# Patient Record
Sex: Female | Born: 1960
Health system: Southern US, Community
[De-identification: ages and names within clinical notes are randomized; demographics above are authoritative.]

## PROBLEM LIST (undated history)

## (undated) DIAGNOSIS — M7070 Other bursitis of hip, unspecified hip: Secondary | ICD-10-CM

## (undated) DIAGNOSIS — F319 Bipolar disorder, unspecified: Secondary | ICD-10-CM

## (undated) DIAGNOSIS — R51 Headache: Secondary | ICD-10-CM

## (undated) DIAGNOSIS — N951 Menopausal and female climacteric states: Secondary | ICD-10-CM

## (undated) DIAGNOSIS — R001 Bradycardia, unspecified: Secondary | ICD-10-CM

## (undated) DIAGNOSIS — F3289 Other specified depressive episodes: Secondary | ICD-10-CM

## (undated) DIAGNOSIS — I4891 Unspecified atrial fibrillation: Secondary | ICD-10-CM

## (undated) DIAGNOSIS — R0602 Shortness of breath: Secondary | ICD-10-CM

## (undated) DIAGNOSIS — I2699 Other pulmonary embolism without acute cor pulmonale: Secondary | ICD-10-CM

## (undated) DIAGNOSIS — I341 Nonrheumatic mitral (valve) prolapse: Secondary | ICD-10-CM

## (undated) DIAGNOSIS — K219 Gastro-esophageal reflux disease without esophagitis: Secondary | ICD-10-CM

## (undated) DIAGNOSIS — Z8719 Personal history of other diseases of the digestive system: Secondary | ICD-10-CM

## (undated) DIAGNOSIS — I10 Essential (primary) hypertension: Secondary | ICD-10-CM

## (undated) DIAGNOSIS — IMO0001 Reserved for inherently not codable concepts without codable children: Secondary | ICD-10-CM

## (undated) DIAGNOSIS — I471 Supraventricular tachycardia: Secondary | ICD-10-CM

## (undated) DIAGNOSIS — E041 Nontoxic single thyroid nodule: Secondary | ICD-10-CM

## (undated) DIAGNOSIS — J439 Emphysema, unspecified: Secondary | ICD-10-CM

## (undated) DIAGNOSIS — F419 Anxiety disorder, unspecified: Secondary | ICD-10-CM

## (undated) DIAGNOSIS — Z0389 Encounter for observation for other suspected diseases and conditions ruled out: Secondary | ICD-10-CM

## (undated) DIAGNOSIS — E785 Hyperlipidemia, unspecified: Secondary | ICD-10-CM

## (undated) DIAGNOSIS — F329 Major depressive disorder, single episode, unspecified: Secondary | ICD-10-CM

## (undated) DIAGNOSIS — R251 Tremor, unspecified: Secondary | ICD-10-CM

## (undated) DIAGNOSIS — I491 Atrial premature depolarization: Secondary | ICD-10-CM

## (undated) DIAGNOSIS — I4719 Other supraventricular tachycardia: Secondary | ICD-10-CM

## (undated) HISTORY — DX: Supraventricular tachycardia: I47.1

## (undated) HISTORY — DX: Unspecified atrial fibrillation: I48.91

## (undated) HISTORY — DX: Headache: R51

## (undated) HISTORY — DX: Atrial premature depolarization: I49.1

## (undated) HISTORY — DX: Bradycardia, unspecified: R00.1

## (undated) HISTORY — DX: Menopausal and female climacteric states: N95.1

## (undated) HISTORY — PX: APPENDECTOMY: SHX54

## (undated) HISTORY — DX: Encounter for observation for other suspected diseases and conditions ruled out: Z03.89

## (undated) HISTORY — PX: COLONOSCOPY: SHX174

## (undated) HISTORY — DX: Other specified depressive episodes: F32.89

## (undated) HISTORY — DX: Nontoxic single thyroid nodule: E04.1

## (undated) HISTORY — DX: Nonrheumatic mitral (valve) prolapse: I34.1

## (undated) HISTORY — DX: Gastro-esophageal reflux disease without esophagitis: K21.9

## (undated) HISTORY — DX: Bipolar disorder, unspecified: F31.9

## (undated) HISTORY — DX: Reserved for inherently not codable concepts without codable children: IMO0001

## (undated) HISTORY — DX: Major depressive disorder, single episode, unspecified: F32.9

## (undated) HISTORY — DX: Other bursitis of hip, unspecified hip: M70.70

## (undated) HISTORY — DX: Essential (primary) hypertension: I10

## (undated) HISTORY — DX: Anxiety disorder, unspecified: F41.9

## (undated) HISTORY — DX: Shortness of breath: R06.02

## (undated) HISTORY — DX: Personal history of other diseases of the digestive system: Z87.19

## (undated) HISTORY — DX: Other supraventricular tachycardia: I47.19

## (undated) HISTORY — DX: Hyperlipidemia, unspecified: E78.5

---

## 1988-02-11 HISTORY — PX: OTHER SURGICAL HISTORY: SHX169

## 1999-08-06 ENCOUNTER — Encounter: Payer: Self-pay | Admitting: Orthopedic Surgery

## 1999-08-06 ENCOUNTER — Ambulatory Visit (HOSPITAL_COMMUNITY): Admission: RE | Admit: 1999-08-06 | Discharge: 1999-08-06 | Payer: Self-pay | Admitting: Orthopedic Surgery

## 1999-09-11 DIAGNOSIS — M7070 Other bursitis of hip, unspecified hip: Secondary | ICD-10-CM

## 1999-09-11 HISTORY — DX: Other bursitis of hip, unspecified hip: M70.70

## 2000-09-03 ENCOUNTER — Encounter: Admission: RE | Admit: 2000-09-03 | Discharge: 2000-09-03 | Payer: Self-pay | Admitting: Obstetrics and Gynecology

## 2000-09-03 ENCOUNTER — Encounter: Payer: Self-pay | Admitting: Obstetrics and Gynecology

## 2000-10-15 ENCOUNTER — Other Ambulatory Visit: Admission: RE | Admit: 2000-10-15 | Discharge: 2000-10-15 | Payer: Self-pay | Admitting: Obstetrics and Gynecology

## 2000-12-01 ENCOUNTER — Encounter (INDEPENDENT_AMBULATORY_CARE_PROVIDER_SITE_OTHER): Payer: Self-pay | Admitting: Specialist

## 2000-12-01 ENCOUNTER — Other Ambulatory Visit: Admission: RE | Admit: 2000-12-01 | Discharge: 2000-12-01 | Payer: Self-pay | Admitting: Gastroenterology

## 2000-12-17 ENCOUNTER — Ambulatory Visit (HOSPITAL_COMMUNITY): Admission: RE | Admit: 2000-12-17 | Discharge: 2000-12-17 | Payer: Self-pay | Admitting: Family Medicine

## 2000-12-17 ENCOUNTER — Encounter: Payer: Self-pay | Admitting: Family Medicine

## 2000-12-17 DIAGNOSIS — I341 Nonrheumatic mitral (valve) prolapse: Secondary | ICD-10-CM

## 2000-12-17 HISTORY — DX: Nonrheumatic mitral (valve) prolapse: I34.1

## 2001-10-26 ENCOUNTER — Encounter: Admission: RE | Admit: 2001-10-26 | Discharge: 2001-10-26 | Payer: Self-pay | Admitting: Family Medicine

## 2001-10-26 ENCOUNTER — Encounter: Payer: Self-pay | Admitting: Family Medicine

## 2001-12-16 ENCOUNTER — Other Ambulatory Visit: Admission: RE | Admit: 2001-12-16 | Discharge: 2001-12-16 | Payer: Self-pay | Admitting: Obstetrics and Gynecology

## 2002-12-14 ENCOUNTER — Encounter: Admission: RE | Admit: 2002-12-14 | Discharge: 2002-12-14 | Payer: Self-pay | Admitting: Family Medicine

## 2002-12-28 ENCOUNTER — Other Ambulatory Visit: Admission: RE | Admit: 2002-12-28 | Discharge: 2002-12-28 | Payer: Self-pay | Admitting: Obstetrics and Gynecology

## 2003-02-08 ENCOUNTER — Emergency Department (HOSPITAL_COMMUNITY): Admission: EM | Admit: 2003-02-08 | Discharge: 2003-02-08 | Payer: Self-pay | Admitting: Emergency Medicine

## 2004-02-28 ENCOUNTER — Ambulatory Visit: Payer: Self-pay | Admitting: Family Medicine

## 2004-06-10 ENCOUNTER — Ambulatory Visit: Payer: Self-pay | Admitting: Family Medicine

## 2004-06-12 ENCOUNTER — Ambulatory Visit: Payer: Self-pay | Admitting: Family Medicine

## 2004-06-24 ENCOUNTER — Encounter: Admission: RE | Admit: 2004-06-24 | Discharge: 2004-06-24 | Payer: Self-pay | Admitting: Family Medicine

## 2004-10-29 ENCOUNTER — Ambulatory Visit: Payer: Self-pay | Admitting: Family Medicine

## 2004-11-07 ENCOUNTER — Encounter: Admission: RE | Admit: 2004-11-07 | Discharge: 2004-11-07 | Payer: Self-pay | Admitting: Obstetrics and Gynecology

## 2004-12-27 ENCOUNTER — Other Ambulatory Visit: Admission: RE | Admit: 2004-12-27 | Discharge: 2004-12-27 | Payer: Self-pay | Admitting: Obstetrics and Gynecology

## 2005-09-26 ENCOUNTER — Ambulatory Visit: Payer: Self-pay | Admitting: Family Medicine

## 2005-10-27 ENCOUNTER — Ambulatory Visit (HOSPITAL_COMMUNITY): Admission: RE | Admit: 2005-10-27 | Discharge: 2005-10-27 | Payer: Self-pay | Admitting: Gastroenterology

## 2005-10-27 LAB — HM COLONOSCOPY: HM Colonoscopy: NORMAL

## 2005-11-05 ENCOUNTER — Ambulatory Visit (HOSPITAL_COMMUNITY): Admission: RE | Admit: 2005-11-05 | Discharge: 2005-11-05 | Payer: Self-pay | Admitting: Gastroenterology

## 2005-11-26 LAB — HM MAMMOGRAPHY: HM Mammogram: NORMAL

## 2006-03-20 ENCOUNTER — Ambulatory Visit: Payer: Self-pay | Admitting: Licensed Clinical Social Worker

## 2006-03-27 ENCOUNTER — Ambulatory Visit: Payer: Self-pay | Admitting: Licensed Clinical Social Worker

## 2006-04-03 ENCOUNTER — Encounter: Admission: RE | Admit: 2006-04-03 | Discharge: 2006-04-03 | Payer: Self-pay | Admitting: Obstetrics and Gynecology

## 2006-04-22 ENCOUNTER — Other Ambulatory Visit: Admission: RE | Admit: 2006-04-22 | Discharge: 2006-04-22 | Payer: Self-pay | Admitting: Obstetrics and Gynecology

## 2006-05-15 ENCOUNTER — Ambulatory Visit: Payer: Self-pay | Admitting: Licensed Clinical Social Worker

## 2006-05-21 ENCOUNTER — Ambulatory Visit: Payer: Self-pay | Admitting: Family Medicine

## 2006-05-21 LAB — CONVERTED CEMR LAB
ALT: 14 units/L (ref 0–40)
AST: 16 units/L (ref 0–37)
Albumin: 3.3 g/dL — ABNORMAL LOW (ref 3.5–5.2)
Alkaline Phosphatase: 72 units/L (ref 39–117)
BUN: 12 mg/dL (ref 6–23)
Bilirubin, Direct: 0.1 mg/dL (ref 0.0–0.3)
CO2: 26 meq/L (ref 19–32)
Calcium: 8.8 mg/dL (ref 8.4–10.5)
Chloride: 109 meq/L (ref 96–112)
Creatinine, Ser: 0.8 mg/dL (ref 0.4–1.2)
Free T4: 0.6 ng/dL (ref 0.6–1.6)
GFR calc Af Amer: 99 mL/min
GFR calc non Af Amer: 82 mL/min
Glucose, Bld: 100 mg/dL — ABNORMAL HIGH (ref 70–99)
Potassium: 3.5 meq/L (ref 3.5–5.1)
Sodium: 142 meq/L (ref 135–145)
T3, Free: 2.9 pg/mL (ref 2.3–4.2)
TSH: 1.6 microintl units/mL (ref 0.35–5.50)
Total Bilirubin: 0.5 mg/dL (ref 0.3–1.2)
Total Protein: 6.7 g/dL (ref 6.0–8.3)

## 2006-05-29 ENCOUNTER — Ambulatory Visit: Payer: Self-pay | Admitting: Licensed Clinical Social Worker

## 2006-06-12 ENCOUNTER — Ambulatory Visit: Payer: Self-pay | Admitting: Licensed Clinical Social Worker

## 2006-07-10 ENCOUNTER — Ambulatory Visit: Payer: Self-pay | Admitting: Licensed Clinical Social Worker

## 2006-07-23 ENCOUNTER — Ambulatory Visit: Payer: Self-pay | Admitting: Family Medicine

## 2006-07-23 DIAGNOSIS — F329 Major depressive disorder, single episode, unspecified: Secondary | ICD-10-CM | POA: Insufficient documentation

## 2006-07-23 DIAGNOSIS — F3289 Other specified depressive episodes: Secondary | ICD-10-CM | POA: Insufficient documentation

## 2006-07-23 DIAGNOSIS — R638 Other symptoms and signs concerning food and fluid intake: Secondary | ICD-10-CM | POA: Insufficient documentation

## 2006-07-23 DIAGNOSIS — R5381 Other malaise: Secondary | ICD-10-CM | POA: Insufficient documentation

## 2006-07-23 DIAGNOSIS — R5383 Other fatigue: Secondary | ICD-10-CM

## 2006-07-23 LAB — CONVERTED CEMR LAB
Bilirubin Urine: NEGATIVE
Blood in Urine, dipstick: NEGATIVE
Glucose, Bld: 96 mg/dL
Glucose, Urine, Semiquant: NEGATIVE
Hemoglobin: 11.2 g/dL
Ketones, urine, test strip: NEGATIVE
Nitrite: NEGATIVE
Protein, U semiquant: NEGATIVE
Specific Gravity, Urine: <1.005
pH: 6

## 2006-07-28 LAB — CONVERTED CEMR LAB
ALT: 16 units/L (ref 0–40)
AST: 18 units/L (ref 0–37)
Albumin: 3.6 g/dL (ref 3.5–5.2)
Alkaline Phosphatase: 69 units/L (ref 39–117)
BUN: 6 mg/dL (ref 6–23)
Basophils Absolute: 0.1 10*3/uL (ref 0.0–0.1)
Basophils Relative: 1.2 % — ABNORMAL HIGH (ref 0.0–1.0)
Bilirubin, Direct: 0.1 mg/dL (ref 0.0–0.3)
CO2: 27 meq/L (ref 19–32)
Calcium: 9.2 mg/dL (ref 8.4–10.5)
Chloride: 103 meq/L (ref 96–112)
Creatinine, Ser: 0.8 mg/dL (ref 0.4–1.2)
Eosinophils Absolute: 0.3 10*3/uL (ref 0.0–0.6)
Eosinophils Relative: 4.1 % (ref 0.0–5.0)
GFR calc Af Amer: 99 mL/min
GFR calc non Af Amer: 82 mL/min
Glucose, Bld: 80 mg/dL (ref 70–99)
HCT: 40.4 % (ref 36.0–46.0)
Hemoglobin: 13.7 g/dL (ref 12.0–15.0)
Lymphocytes Relative: 33.5 % (ref 12.0–46.0)
MCHC: 33.8 g/dL (ref 30.0–36.0)
MCV: 87.1 fL (ref 78.0–100.0)
Monocytes Absolute: 0.5 10*3/uL (ref 0.2–0.7)
Monocytes Relative: 6.9 % (ref 3.0–11.0)
Neutro Abs: 3.8 10*3/uL (ref 1.4–7.7)
Neutrophils Relative %: 54.3 % (ref 43.0–77.0)
Platelets: 328 10*3/uL (ref 150–400)
Potassium: 3.5 meq/L (ref 3.5–5.1)
RBC: 4.64 M/uL (ref 3.87–5.11)
RDW: 13 % (ref 11.5–14.6)
Sodium: 139 meq/L (ref 135–145)
TSH: 3.8 microintl units/mL (ref 0.35–5.50)
Total Bilirubin: 0.5 mg/dL (ref 0.3–1.2)
Total Protein: 7.2 g/dL (ref 6.0–8.3)
WBC: 7.1 10*3/uL (ref 4.5–10.5)

## 2006-11-05 ENCOUNTER — Ambulatory Visit: Payer: Self-pay | Admitting: Family Medicine

## 2006-11-05 DIAGNOSIS — D179 Benign lipomatous neoplasm, unspecified: Secondary | ICD-10-CM | POA: Insufficient documentation

## 2006-11-05 DIAGNOSIS — L708 Other acne: Secondary | ICD-10-CM | POA: Insufficient documentation

## 2006-11-05 DIAGNOSIS — K219 Gastro-esophageal reflux disease without esophagitis: Secondary | ICD-10-CM | POA: Insufficient documentation

## 2006-11-05 DIAGNOSIS — Z8719 Personal history of other diseases of the digestive system: Secondary | ICD-10-CM | POA: Insufficient documentation

## 2006-11-05 DIAGNOSIS — Z8679 Personal history of other diseases of the circulatory system: Secondary | ICD-10-CM | POA: Insufficient documentation

## 2006-11-06 ENCOUNTER — Encounter (INDEPENDENT_AMBULATORY_CARE_PROVIDER_SITE_OTHER): Payer: Self-pay | Admitting: *Deleted

## 2006-11-06 ENCOUNTER — Encounter: Payer: Self-pay | Admitting: Family Medicine

## 2006-11-10 LAB — CONVERTED CEMR LAB: Valproic Acid Lvl: 33.4 ug/mL — ABNORMAL LOW (ref 50.0–100.0)

## 2006-11-27 ENCOUNTER — Telehealth (INDEPENDENT_AMBULATORY_CARE_PROVIDER_SITE_OTHER): Payer: Self-pay | Admitting: *Deleted

## 2007-04-27 ENCOUNTER — Other Ambulatory Visit: Admission: RE | Admit: 2007-04-27 | Discharge: 2007-04-27 | Payer: Self-pay | Admitting: Obstetrics and Gynecology

## 2007-08-16 ENCOUNTER — Telehealth (INDEPENDENT_AMBULATORY_CARE_PROVIDER_SITE_OTHER): Payer: Self-pay | Admitting: *Deleted

## 2007-08-18 ENCOUNTER — Ambulatory Visit: Payer: Self-pay | Admitting: Family Medicine

## 2007-08-18 DIAGNOSIS — B356 Tinea cruris: Secondary | ICD-10-CM | POA: Insufficient documentation

## 2007-09-13 ENCOUNTER — Encounter: Admission: RE | Admit: 2007-09-13 | Discharge: 2007-09-13 | Payer: Self-pay | Admitting: Family Medicine

## 2007-09-15 ENCOUNTER — Encounter (INDEPENDENT_AMBULATORY_CARE_PROVIDER_SITE_OTHER): Payer: Self-pay | Admitting: *Deleted

## 2007-10-25 ENCOUNTER — Ambulatory Visit: Payer: Self-pay | Admitting: Family Medicine

## 2007-10-25 DIAGNOSIS — R197 Diarrhea, unspecified: Secondary | ICD-10-CM | POA: Insufficient documentation

## 2007-10-26 ENCOUNTER — Encounter (INDEPENDENT_AMBULATORY_CARE_PROVIDER_SITE_OTHER): Payer: Self-pay | Admitting: *Deleted

## 2007-10-27 ENCOUNTER — Ambulatory Visit: Payer: Self-pay | Admitting: Family Medicine

## 2007-10-27 LAB — CONVERTED CEMR LAB
Bilirubin Urine: NEGATIVE
Blood in Urine, dipstick: NEGATIVE
Glucose, Urine, Semiquant: NEGATIVE
Ketones, urine, test strip: NEGATIVE
Nitrite: NEGATIVE
Protein, U semiquant: NEGATIVE
Specific Gravity, Urine: 1.01
Urobilinogen, UA: 0.2
pH: 6.5

## 2007-10-28 ENCOUNTER — Ambulatory Visit: Payer: Self-pay | Admitting: Family Medicine

## 2007-10-28 ENCOUNTER — Encounter (INDEPENDENT_AMBULATORY_CARE_PROVIDER_SITE_OTHER): Payer: Self-pay | Admitting: *Deleted

## 2007-10-28 LAB — CONVERTED CEMR LAB
OCCULT 1: NEGATIVE
OCCULT 2: NEGATIVE
OCCULT 3: NEGATIVE

## 2007-11-01 ENCOUNTER — Telehealth (INDEPENDENT_AMBULATORY_CARE_PROVIDER_SITE_OTHER): Payer: Self-pay | Admitting: *Deleted

## 2007-11-01 ENCOUNTER — Encounter (INDEPENDENT_AMBULATORY_CARE_PROVIDER_SITE_OTHER): Payer: Self-pay | Admitting: *Deleted

## 2007-11-01 LAB — CONVERTED CEMR LAB
ALT: 17 units/L (ref 0–35)
AST: 18 units/L (ref 0–37)
Albumin: 3.1 g/dL — ABNORMAL LOW (ref 3.5–5.2)
Alkaline Phosphatase: 46 units/L (ref 39–117)
BUN: 10 mg/dL (ref 6–23)
Basophils Absolute: 0 10*3/uL (ref 0.0–0.1)
Basophils Relative: 0.6 % (ref 0.0–3.0)
Bilirubin, Direct: 0.1 mg/dL (ref 0.0–0.3)
CO2: 26 meq/L (ref 19–32)
Calcium: 8.3 mg/dL — ABNORMAL LOW (ref 8.4–10.5)
Chloride: 105 meq/L (ref 96–112)
Cholesterol: 192 mg/dL (ref 0–200)
Creatinine, Ser: 0.9 mg/dL (ref 0.4–1.2)
Direct LDL: 133.1 mg/dL
Eosinophils Absolute: 0.1 10*3/uL (ref 0.0–0.7)
Eosinophils Relative: 2.2 % (ref 0.0–5.0)
GFR calc Af Amer: 86 mL/min
GFR calc non Af Amer: 71 mL/min
Glucose, Bld: 93 mg/dL (ref 70–99)
HCT: 36.9 % (ref 36.0–46.0)
HDL: 57.2 mg/dL (ref >39.0)
Hemoglobin: 13 g/dL (ref 12.0–15.0)
Lymphocytes Relative: 39 % (ref 12.0–46.0)
MCHC: 35.1 g/dL (ref 30.0–36.0)
MCV: 88.7 fL (ref 78.0–100.0)
Monocytes Absolute: 0.6 10*3/uL (ref 0.1–1.0)
Monocytes Relative: 8.9 % (ref 3.0–12.0)
Neutro Abs: 3.4 10*3/uL (ref 1.4–7.7)
Neutrophils Relative %: 49.3 % (ref 43.0–77.0)
Platelets: 203 10*3/uL (ref 150–400)
Potassium: 3.2 meq/L — ABNORMAL LOW (ref 3.5–5.1)
RBC: 4.16 M/uL (ref 3.87–5.11)
RDW: 13.1 % (ref 11.5–14.6)
Sodium: 138 meq/L (ref 135–145)
TSH: 6.85 microintl units/mL — ABNORMAL HIGH (ref 0.35–5.50)
Total Bilirubin: 0.6 mg/dL (ref 0.3–1.2)
Total CHOL/HDL Ratio: 3.4
Total Protein: 6.2 g/dL (ref 6.0–8.3)
Triglycerides: 304 mg/dL (ref 0–149)
VLDL: 61 mg/dL — ABNORMAL HIGH (ref 0–40)
Valproic Acid Lvl: 112.4 ug/mL — ABNORMAL HIGH (ref 50.0–100.0)
WBC: 6.8 10*3/uL (ref 4.5–10.5)

## 2007-11-05 ENCOUNTER — Encounter (INDEPENDENT_AMBULATORY_CARE_PROVIDER_SITE_OTHER): Payer: Self-pay | Admitting: *Deleted

## 2007-11-05 ENCOUNTER — Telehealth (INDEPENDENT_AMBULATORY_CARE_PROVIDER_SITE_OTHER): Payer: Self-pay | Admitting: *Deleted

## 2007-11-25 ENCOUNTER — Telehealth (INDEPENDENT_AMBULATORY_CARE_PROVIDER_SITE_OTHER): Payer: Self-pay | Admitting: *Deleted

## 2007-12-30 ENCOUNTER — Encounter (INDEPENDENT_AMBULATORY_CARE_PROVIDER_SITE_OTHER): Payer: Self-pay | Admitting: *Deleted

## 2007-12-31 ENCOUNTER — Ambulatory Visit: Payer: Self-pay | Admitting: Family Medicine

## 2008-01-02 LAB — CONVERTED CEMR LAB: Potassium: 4.1 meq/L (ref 3.5–5.3)

## 2008-01-03 ENCOUNTER — Encounter (INDEPENDENT_AMBULATORY_CARE_PROVIDER_SITE_OTHER): Payer: Self-pay | Admitting: *Deleted

## 2008-05-02 ENCOUNTER — Other Ambulatory Visit: Admission: RE | Admit: 2008-05-02 | Discharge: 2008-05-02 | Payer: Self-pay | Admitting: Obstetrics & Gynecology

## 2008-07-06 ENCOUNTER — Telehealth (INDEPENDENT_AMBULATORY_CARE_PROVIDER_SITE_OTHER): Payer: Self-pay | Admitting: *Deleted

## 2008-07-06 ENCOUNTER — Encounter (INDEPENDENT_AMBULATORY_CARE_PROVIDER_SITE_OTHER): Payer: Self-pay | Admitting: *Deleted

## 2008-07-06 ENCOUNTER — Ambulatory Visit: Payer: Self-pay | Admitting: Family Medicine

## 2008-07-06 DIAGNOSIS — R599 Enlarged lymph nodes, unspecified: Secondary | ICD-10-CM | POA: Insufficient documentation

## 2008-07-06 DIAGNOSIS — E039 Hypothyroidism, unspecified: Secondary | ICD-10-CM | POA: Insufficient documentation

## 2008-07-21 LAB — CONVERTED CEMR LAB
BUN: 10 mg/dL (ref 6–23)
Basophils Absolute: 0 10*3/uL (ref 0.0–0.1)
Basophils Relative: 0.2 % (ref 0.0–3.0)
CO2: 25 meq/L (ref 19–32)
Calcium: 8.8 mg/dL (ref 8.4–10.5)
Chloride: 112 meq/L (ref 96–112)
Creatinine, Ser: 0.9 mg/dL (ref 0.4–1.2)
Eosinophils Absolute: 0.1 10*3/uL (ref 0.0–0.7)
Eosinophils Relative: 2.5 % (ref 0.0–5.0)
GFR calc non Af Amer: 70.84 mL/min (ref >60)
Glucose, Bld: 103 mg/dL — ABNORMAL HIGH (ref 70–99)
HCT: 41.4 % (ref 36.0–46.0)
Hemoglobin: 14.2 g/dL (ref 12.0–15.0)
Lymphocytes Relative: 41.4 % (ref 12.0–46.0)
Lymphs Abs: 1.8 10*3/uL (ref 0.7–4.0)
MCHC: 34.3 g/dL (ref 30.0–36.0)
MCV: 90.1 fL (ref 78.0–100.0)
Monocytes Absolute: 0.2 10*3/uL (ref 0.1–1.0)
Monocytes Relative: 5.8 % (ref 3.0–12.0)
Neutro Abs: 2.2 10*3/uL (ref 1.4–7.7)
Neutrophils Relative %: 50.1 % (ref 43.0–77.0)
Platelets: 217 10*3/uL (ref 150.0–400.0)
Potassium: 4.2 meq/L (ref 3.5–5.1)
RBC: 4.59 M/uL (ref 3.87–5.11)
RDW: 13.6 % (ref 11.5–14.6)
Sodium: 144 meq/L (ref 135–145)
TSH: 2.45 microintl units/mL (ref 0.35–5.50)
WBC: 4.3 10*3/uL — ABNORMAL LOW (ref 4.5–10.5)

## 2008-07-24 ENCOUNTER — Encounter (INDEPENDENT_AMBULATORY_CARE_PROVIDER_SITE_OTHER): Payer: Self-pay | Admitting: *Deleted

## 2008-09-13 ENCOUNTER — Encounter: Admission: RE | Admit: 2008-09-13 | Discharge: 2008-09-13 | Payer: Self-pay | Admitting: Obstetrics and Gynecology

## 2008-09-15 ENCOUNTER — Encounter: Admission: RE | Admit: 2008-09-15 | Discharge: 2008-09-15 | Payer: Self-pay | Admitting: Obstetrics and Gynecology

## 2009-03-13 ENCOUNTER — Ambulatory Visit: Payer: Self-pay | Admitting: Family Medicine

## 2009-03-13 DIAGNOSIS — IMO0001 Reserved for inherently not codable concepts without codable children: Secondary | ICD-10-CM | POA: Insufficient documentation

## 2009-03-13 DIAGNOSIS — E785 Hyperlipidemia, unspecified: Secondary | ICD-10-CM | POA: Insufficient documentation

## 2009-03-13 DIAGNOSIS — F3181 Bipolar II disorder: Secondary | ICD-10-CM | POA: Insufficient documentation

## 2009-03-20 ENCOUNTER — Ambulatory Visit: Payer: Self-pay | Admitting: Family Medicine

## 2009-03-21 ENCOUNTER — Telehealth (INDEPENDENT_AMBULATORY_CARE_PROVIDER_SITE_OTHER): Payer: Self-pay | Admitting: *Deleted

## 2009-03-21 LAB — CONVERTED CEMR LAB
ALT: 19 units/L (ref 0–35)
AST: 22 units/L (ref 0–37)
Albumin: 3.4 g/dL — ABNORMAL LOW (ref 3.5–5.2)
Alkaline Phosphatase: 47 units/L (ref 39–117)
BUN: 10 mg/dL (ref 6–23)
Basophils Absolute: 0 10*3/uL (ref 0.0–0.1)
Basophils Relative: 0 % (ref 0.0–3.0)
Bilirubin, Direct: 0.1 mg/dL (ref 0.0–0.3)
CO2: 24 meq/L (ref 19–32)
Calcium: 8.7 mg/dL (ref 8.4–10.5)
Chloride: 107 meq/L (ref 96–112)
Cholesterol: 209 mg/dL — ABNORMAL HIGH (ref 0–200)
Creatinine, Ser: 0.9 mg/dL (ref 0.4–1.2)
Direct LDL: 150.2 mg/dL
Eosinophils Absolute: 0.1 10*3/uL (ref 0.0–0.7)
Eosinophils Relative: 1.6 % (ref 0.0–5.0)
Free T4: 0.7 ng/dL (ref 0.6–1.6)
GFR calc non Af Amer: 70.64 mL/min (ref >60)
Glucose, Bld: 82 mg/dL (ref 70–99)
HCT: 42.4 % (ref 36.0–46.0)
HDL: 46.3 mg/dL (ref >39.00)
Hemoglobin: 13.9 g/dL (ref 12.0–15.0)
Hgb A1c MFr Bld: 5.3 % (ref 4.6–6.5)
Lymphocytes Relative: 39.9 % (ref 12.0–46.0)
Lymphs Abs: 3.4 10*3/uL (ref 0.7–4.0)
MCHC: 32.7 g/dL (ref 30.0–36.0)
MCV: 90.2 fL (ref 78.0–100.0)
Monocytes Absolute: 0.6 10*3/uL (ref 0.1–1.0)
Monocytes Relative: 7.1 % (ref 3.0–12.0)
Neutro Abs: 4.3 10*3/uL (ref 1.4–7.7)
Neutrophils Relative %: 51.4 % (ref 43.0–77.0)
Platelets: 200 10*3/uL (ref 150.0–400.0)
Potassium: 3.9 meq/L (ref 3.5–5.1)
RBC: 4.69 M/uL (ref 3.87–5.11)
RDW: 13.6 % (ref 11.5–14.6)
Sodium: 139 meq/L (ref 135–145)
T3, Free: 2.8 pg/mL (ref 2.3–4.2)
TSH: 5.48 microintl units/mL (ref 0.35–5.50)
Total Bilirubin: 0.3 mg/dL (ref 0.3–1.2)
Total CHOL/HDL Ratio: 5
Total Protein: 6.7 g/dL (ref 6.0–8.3)
Triglycerides: 218 mg/dL — ABNORMAL HIGH (ref 0.0–149.0)
VLDL: 43.6 mg/dL — ABNORMAL HIGH (ref 0.0–40.0)
WBC: 8.4 10*3/uL (ref 4.5–10.5)

## 2009-03-23 ENCOUNTER — Telehealth (INDEPENDENT_AMBULATORY_CARE_PROVIDER_SITE_OTHER): Payer: Self-pay | Admitting: *Deleted

## 2009-03-23 LAB — CONVERTED CEMR LAB
Valproic Acid Lvl: 96.1 ug/mL (ref 50.0–100.0)
Vit D, 25-Hydroxy: 26 ng/mL — ABNORMAL LOW (ref 30–89)

## 2009-04-04 ENCOUNTER — Ambulatory Visit: Payer: Self-pay | Admitting: Family Medicine

## 2009-04-10 ENCOUNTER — Ambulatory Visit: Payer: Self-pay | Admitting: Licensed Clinical Social Worker

## 2009-04-23 ENCOUNTER — Ambulatory Visit: Payer: Self-pay | Admitting: Licensed Clinical Social Worker

## 2009-05-18 ENCOUNTER — Ambulatory Visit: Payer: Self-pay | Admitting: Licensed Clinical Social Worker

## 2009-05-25 ENCOUNTER — Ambulatory Visit: Payer: Self-pay | Admitting: Licensed Clinical Social Worker

## 2009-06-15 ENCOUNTER — Ambulatory Visit: Payer: Self-pay | Admitting: Licensed Clinical Social Worker

## 2009-06-29 ENCOUNTER — Ambulatory Visit: Payer: Self-pay | Admitting: Licensed Clinical Social Worker

## 2009-07-18 ENCOUNTER — Ambulatory Visit: Payer: Self-pay | Admitting: Family Medicine

## 2009-07-19 LAB — CONVERTED CEMR LAB
ALT: 22 units/L (ref 0–35)
AST: 23 units/L (ref 0–37)
Albumin: 3.6 g/dL (ref 3.5–5.2)
Alkaline Phosphatase: 46 units/L (ref 39–117)
Bilirubin, Direct: 0.1 mg/dL (ref 0.0–0.3)
Cholesterol: 177 mg/dL (ref 0–200)
HDL: 49.1 mg/dL (ref >39.00)
LDL Cholesterol: 95 mg/dL (ref 0–99)
Total Bilirubin: 0.5 mg/dL (ref 0.3–1.2)
Total CHOL/HDL Ratio: 4
Total Protein: 6.7 g/dL (ref 6.0–8.3)
Triglycerides: 166 mg/dL — ABNORMAL HIGH (ref 0.0–149.0)
VLDL: 33.2 mg/dL (ref 0.0–40.0)

## 2009-08-03 ENCOUNTER — Ambulatory Visit: Payer: Self-pay | Admitting: Licensed Clinical Social Worker

## 2009-10-22 ENCOUNTER — Ambulatory Visit: Payer: Self-pay | Admitting: Licensed Clinical Social Worker

## 2009-11-09 ENCOUNTER — Ambulatory Visit: Payer: Self-pay | Admitting: Licensed Clinical Social Worker

## 2009-11-30 ENCOUNTER — Ambulatory Visit: Payer: Self-pay | Admitting: Licensed Clinical Social Worker

## 2009-12-17 ENCOUNTER — Ambulatory Visit: Payer: Self-pay | Admitting: Licensed Clinical Social Worker

## 2010-02-26 ENCOUNTER — Ambulatory Visit
Admission: RE | Admit: 2010-02-26 | Discharge: 2010-02-26 | Payer: Self-pay | Source: Home / Self Care | Attending: Internal Medicine | Admitting: Internal Medicine

## 2010-02-26 ENCOUNTER — Encounter: Payer: Self-pay | Admitting: Internal Medicine

## 2010-02-26 DIAGNOSIS — R079 Chest pain, unspecified: Secondary | ICD-10-CM | POA: Insufficient documentation

## 2010-03-03 ENCOUNTER — Encounter: Payer: Self-pay | Admitting: Obstetrics and Gynecology

## 2010-03-04 ENCOUNTER — Encounter: Payer: Self-pay | Admitting: Obstetrics and Gynecology

## 2010-03-10 LAB — CONVERTED CEMR LAB
ALT: 16 units/L (ref 0–35)
AST: 17 units/L (ref 0–37)
Albumin: 3.2 g/dL — ABNORMAL LOW (ref 3.5–5.2)
Alkaline Phosphatase: 53 units/L (ref 39–117)
BUN: 10 mg/dL (ref 6–23)
Basophils Absolute: 0 10*3/uL (ref 0.0–0.1)
Basophils Relative: 0.3 % (ref 0.0–1.0)
Bilirubin, Direct: 0.1 mg/dL (ref 0.0–0.3)
CO2: 26 meq/L (ref 19–32)
Calcium: 8.6 mg/dL (ref 8.4–10.5)
Chloride: 107 meq/L (ref 96–112)
Cholesterol: 211 mg/dL (ref 0–200)
Creatinine, Ser: 0.8 mg/dL (ref 0.4–1.2)
Direct LDL: 130.4 mg/dL
Eosinophils Absolute: 0.2 10*3/uL (ref 0.0–0.6)
Eosinophils Relative: 3.2 % (ref 0.0–5.0)
GFR calc Af Amer: 99 mL/min
GFR calc non Af Amer: 82 mL/min
Glucose, Bld: 94 mg/dL (ref 70–99)
HCT: 35.5 % — ABNORMAL LOW (ref 36.0–46.0)
HDL: 60.1 mg/dL (ref >39.0)
Hemoglobin: 12.3 g/dL (ref 12.0–15.0)
Lymphocytes Relative: 26.8 % (ref 12.0–46.0)
MCHC: 34.6 g/dL (ref 30.0–36.0)
MCV: 86 fL (ref 78.0–100.0)
Monocytes Absolute: 0.5 10*3/uL (ref 0.2–0.7)
Monocytes Relative: 6.7 % (ref 3.0–11.0)
Neutro Abs: 4.7 10*3/uL (ref 1.4–7.7)
Neutrophils Relative %: 63 % (ref 43.0–77.0)
Pap Smear: NORMAL
Phenytoin Lvl: <0.5 ug/mL — ABNORMAL LOW (ref 10.0–20.0)
Platelets: 322 10*3/uL (ref 150–400)
Potassium: 3.7 meq/L (ref 3.5–5.1)
RBC: 4.13 M/uL (ref 3.87–5.11)
RDW: 13 % (ref 11.5–14.6)
Sodium: 141 meq/L (ref 135–145)
TSH: 3.88 microintl units/mL (ref 0.35–5.50)
Total Bilirubin: 0.8 mg/dL (ref 0.3–1.2)
Total CHOL/HDL Ratio: 3.5
Total Protein: 6.5 g/dL (ref 6.0–8.3)
Triglycerides: 206 mg/dL (ref 0–149)
VLDL: 41 mg/dL — ABNORMAL HIGH (ref 0–40)
WBC: 7.4 10*3/uL (ref 4.5–10.5)

## 2010-03-12 ENCOUNTER — Ambulatory Visit: Admit: 2010-03-12 | Payer: Self-pay

## 2010-03-12 NOTE — Letter (Signed)
Summary: Results Follow-up Letter  [place] [facility]  [address] [place], [place] [address]   Phone: [phone]  Fax: [phone]    09/15/2007        Sruthi Maurer [address] [place], [place]  [address]  Dear Ms. Belford,   The following are the results of your recent test(s):  Test     Result     Pap Smear    Normal_______  Not Normal_____       Comments: _________________________________________________________ Cholesterol LDL(Bad cholesterol):          Your goal is less than:         HDL (Good cholesterol):        Your goal is more than: _________________________________________________________ Other Tests:   _________________________________________________________  Please call for an appointment Or Please see attached lab._________________________________________________________ _________________________________________________________ _________________________________________________________  Sincerely,  [name] [place] at [facility]

## 2010-03-12 NOTE — Assessment & Plan Note (Signed)
Summary: feels really weak//tl   Vital Signs:  Patient Profile:   50 Years Old Female Weight:      147 pounds Temp:     98.3 degrees F oral Pulse rate:   88 / minute BP sitting:   118 / 80  (left arm)  Vitals Entered By: Shonna Chock (July 23, 2006 3:22 PM)               PCP:  Laury Axon  Chief Complaint:  THIRSTY, WEAK, DIZZY, and AND HARD TIME THINKING X 2 WEEKS.  History of Present Illness: Pt here with husband c/o of weakness and being thirsty.  Wellbutrin was recently increased to 300 and Effexor increase  slowly to 225 mg  over nine or ten weeks.  No other changes in meds or otc.  No cp, no sob, no ha.    Past Medical History:    Depression     Review of Systems  General      Complains of fatigue, malaise, and weakness.  ENT      Denies decreased hearing, difficulty swallowing, ear discharge, earache, hoarseness, nasal congestion, nosebleeds, postnasal drainage, ringing in ears, sinus pressure, and sore throat.  CV      Denies chest pain or discomfort, difficulty breathing while lying down, fatigue, palpitations, and shortness of breath with exertion.  Resp      Denies chest discomfort, chest pain with inspiration, cough, and shortness of breath.  GI      Denies abdominal pain, diarrhea, nausea, and vomiting.  GU      Denies abnormal vaginal bleeding, decreased libido, discharge, dysuria, genital sores, hematuria, incontinence, nocturia, urinary frequency, and urinary hesitancy.  Psych      Complains of depression and irritability.      Denies anxiety.  Endo      Complains of excessive thirst.   Physical Exam  General:     Well-developed,well-nourished,in no acute distress; alert,appropriate and cooperative throughout examination Head:     Normocephalic and atraumatic without obvious abnormalities. No apparent alopecia or balding. Mouth:     Oral mucosa and oropharynx without lesions or exudates.  Teeth in good repair. Lungs:     Normal  respiratory effort, chest expands symmetrically. Lungs are clear to auscultation, no crackles or wheezes. Heart:     +mvp normal rate.   Abdomen:     Bowel sounds positive,abdomen soft and non-tender without masses, organomegaly or hernias noted. Msk:     normal ROM.   Extremities:     No clubbing, cyanosis, edema, or deformity noted with normal full range of motion of all joints.   Neurologic:     alert & oriented X3, cranial nerves II-XII intact, strength normal in all extremities, and gait normal.   Skin:     Intact without suspicious lesions or rashes Psych:     Oriented X3, memory intact for recent and remote, normally interactive, and good eye contact.      Impression & Recommendations:  Problem # 1:  MALAISE AND FATIGUE (ICD-780.79) check labs,  maybe because of increase in both meds and decrease in appetite pt needs to eat at least 3 good meals a day  F/U psych for input--- rto prn Orders: TLB-BMP (Basic Metabolic Panel-BMET) (80048-METABOL) TLB-CBC Platelet - w/Differential (85025-CBCD) TLB-Hepatic/Liver Function Pnl (80076-HEPATIC) TLB-TSH (Thyroid Stimulating Hormone) (84443-TSH)   Problem # 2:  THIRST (ICD-994.3) prob secondary to increase in Wellbutrin Orders: TLB-BMP (Basic Metabolic Panel-BMET) (80048-METABOL) TLB-CBC Platelet - w/Differential (85025-CBCD) TLB-Hepatic/Liver Function Pnl (  80076-HEPATIC) TLB-TSH (Thyroid Stimulating Hormone) (84443-TSH)   Medications Added to Medication List This Visit: 1)  Flonase 50 Mcg/act Susp (Fluticasone propionate) .... 2 sprays each nostril once daily 2)  Nexium 40 Mg Cpdr (Esomeprazole magnesium) .Marland Kitchen.. 1 po once daily     Laboratory Results   Urine Tests  Date/Time Recieved: 07/23/2006 Date/Time Reported: 07/23/2006 4:10 pm  Routine Urinalysis   Color: yellow Appearance: Clear Glucose: negative   (Normal Range: Negative) Bilirubin: negative   (Normal Range: Negative) Ketone: negative   (Normal Range:  Negative) Spec. Gravity: <1.005   (Normal Range: 1.003-1.035) Blood: negative   (Normal Range: Negative) pH: 6.0   (Normal Range: 5.0-8.0) Protein: negative   (Normal Range: Negative) Nitrite: negative   (Normal Range: Negative)     Blood Tests     Glucose (random): 96 mg/dL   (Normal Range: 16-109)  CBC HGB:  11.2 g/dL   (Normal Range: 60.4-54.0 in Males, 12.0-15.0 in Females)

## 2010-03-12 NOTE — Assessment & Plan Note (Signed)
Summary: tb skin test//lch  Nurse Visit   Allergies: No Known Drug Allergies  Immunizations Administered:  PPD Skin Test:    Vaccine Type: PPD    Site: right forearm    Mfr: Sanofi Pasteur    Dose: 0.1 ml    Route: ID    Given by: Floydene Flock    Exp. Date: 07/08/2011    Lot #: [id number]  Orders Added: 1)  TB Skin Test [86580] 2)  Admin 1st Vaccine [90471]  Appended Document: tb skin test//lch   PPD Results    Date of reading: 04/06/2009    Results: < 5mm    Interpretation: negative

## 2010-03-12 NOTE — Letter (Signed)
Summary: Results Follow-up Letter  [place] at [facility]  [address] [place], [place] [address]   Phone: [phone]  Fax: [phone]    01/03/2008        Lauralei Clouse [address] [place], [place]  [address]  Dear Ms. Hickmon,   The following are the results of your recent test(s):  Test     Result     Pap Smear    Normal_______  Not Normal_____       Comments: _________________________________________________________ Cholesterol LDL(Bad cholesterol):          Your goal is less than:         HDL (Good cholesterol):        Your goal is more than: _________________________________________________________ Other Tests:   _________________________________________________________  Please call for an appointment Or Please see attached labs._________________________________________________________ _________________________________________________________ _________________________________________________________  Sincerely,  Felecia Deloach CMA [place] at [facility]

## 2010-03-12 NOTE — Assessment & Plan Note (Signed)
Summary: discuss bloodwork/kdc   Vital Signs:  Patient profile:   50 year old female Weight:      178 pounds Temp:     97.6 degrees F oral Pulse rate:   82 / minute Pulse rhythm:   regular BP sitting:   124 / 82  (left arm) Cuff size:   regular  Vitals Entered By: Army Fossa CMA (March 13, 2009 1:02 PM) CC: Discuss what bloodwork she needs to have done.   History of Present Illness: Pt here to discuss having blood work done for psych.  Pt also would like to have cholesterol checked and vita D.    Preventive Screening-Counseling & Management  Alcohol-Tobacco     Alcohol drinks/day: 1     Alcohol type: wine     Smoking Status: quit     Year Quit: 1998     Pack years: 20     Passive Smoke Exposure: no  Caffeine-Diet-Exercise     Caffeine use/day: 0     Does Patient Exercise: no  Current Medications (verified): 1)  Flonase 50 Mcg/act  Susp (Fluticasone Propionate) .... 2 Sprays Each Nostril Once Daily 2)  Nexium 40 Mg  Cpdr (Esomeprazole Magnesium) .Marland Kitchen.. 1 Po Once Daily 3)  Wellbutrin Xl 150 Mg  Tb24 (Bupropion Hcl) .... Take One Tablet Three Times Daily 4)  Depakote Er 500 Mg  Tb24 (Divalproex Sodium) .... Take One Tablet At Bedtime 5)  Loestrin 24 Fe 1-20 Mg-Mcg Tabs (Norethin Ace-Eth Estrad-Fe) .... Take One Tablet Daily. 6)  Oracea 40 Mg Cpdr (Doxycycline (Rosacea)) .Marland Kitchen.. 1 By Mouth Once Daily  Allergies (verified): No Known Drug Allergies  Past History:  Past Surgical History: Last updated: 11/05/2006 Lipoma--R side  Family History: Last updated: 11/05/2006 Family History Lung cancer-- M esophageal CA-- F Family History of Colon CA 1st degree relative <60 B-- sarcoid and testicular CA  Social History: Last updated: 11/05/2006 Married Former Smoker Alcohol use-yes Drug use-no Regular exercise-no  Risk Factors: Alcohol Use: 1 (03/13/2009) Caffeine Use: 0 (03/13/2009) Exercise: no (03/13/2009)  Risk Factors: Smoking Status: quit  (03/13/2009) Passive Smoke Exposure: no (03/13/2009)  Past Medical History: Depression bipolar IBS MVP GERD ACNE--- s/p accutane Hyperlipidemia Hypothyroidism  Family History: Reviewed history from 11/05/2006 and no changes required. Family History Lung cancer-- M esophageal CA-- F Family History of Colon CA 1st degree relative <60 B-- sarcoid and testicular CA  Social History: Reviewed history from 11/05/2006 and no changes required. Married Former Smoker Alcohol use-yes Drug use-no Regular exercise-no  Review of Systems      See HPI  Physical Exam  General:  Well-developed,well-nourished,in no acute distress; alert,appropriate and cooperative throughout examination Lungs:  Normal respiratory effort, chest expands symmetrically. Lungs are clear to auscultation, no crackles or wheezes. Heart:  normal rate and no murmur.   Extremities:  No clubbing, cyanosis, edema, or deformity noted with normal full range of motion of all joints.   Psych:  Oriented X3 and normally interactive.     Impression & Recommendations:  Problem # 1:  MYALGIA (ICD-729.1) check labs  Problem # 2:  UNSPECIFIED HYPOTHYROIDISM (ICD-244.9)  check labs pt off meds  Labs Reviewed: TSH: 2.45 (07/06/2008)    Chol: 192 (10/27/2007)   HDL: 57.2 (10/27/2007)   LDL: DEL (10/27/2007)   TG: 304 (10/27/2007)  Problem # 3:  BIPOLAR DISORDER UNSPECIFIED (ICD-296.80)  con't meds per psych check labs  Problem # 4:  HYPERLIPIDEMIA (ICD-272.4)  check labs  Labs Reviewed: SGOT: 18 (10/27/2007)  SGPT: 17 (10/27/2007)   HDL:57.2 (10/27/2007), 60.1 (11/05/2006)  LDL:DEL (10/27/2007), DEL (11/05/2006)  Chol:192 (10/27/2007), 211 (11/05/2006)  Trig:304 (10/27/2007), 206 (11/05/2006)  Problem # 5:  GERD (ICD-530.81)  Her updated medication list for this problem includes:    Nexium 40 Mg Cpdr (Esomeprazole magnesium) .Marland Kitchen... 1 po once daily  Diagnostics Reviewed:  Discussed lifestyle modifications,  diet, antacids/medications, and preventive measures. Handout provided.   Complete Medication List: 1)  Flonase 50 Mcg/act Susp (Fluticasone propionate) .... 2 sprays each nostril once daily 2)  Nexium 40 Mg Cpdr (Esomeprazole magnesium) .Marland Kitchen.. 1 po once daily 3)  Wellbutrin Xl 150 Mg Tb24 (Bupropion hcl) .... Take one tablet three times daily 4)  Depakote Er 500 Mg Tb24 (Divalproex sodium) .... Take one tablet at bedtime 5)  Loestrin 24 Fe 1-20 Mg-mcg Tabs (Norethin ace-eth estrad-fe) .... Take one tablet daily. 6)  Oracea 40 Mg Cpdr (Doxycycline (rosacea)) .Marland Kitchen.. 1 by mouth once daily  Patient Instructions: 1)  311.00  244.9 729.1  272.4   LIPID, HEP, BMP, VITA d,  TSH, FREE T3,  FREE T4, DEPAKOTE LEVEL, CBCD,  hgb a1c ----  FASTING LABS Prescriptions: NEXIUM 40 MG  CPDR (ESOMEPRAZOLE MAGNESIUM) 1 po once daily  #90 x 3   Entered and Authorized by:   Loreen Freud DO   Signed by:   Loreen Freud DO on 03/13/2009   Method used:   Electronically to        [facility]* (retail)       [address]       [address] Shipping/mailing       [place], [place]  [address]       Ph: [phone]       Fax: [phone]   RxID:   [id number]

## 2010-03-12 NOTE — Progress Notes (Signed)
Summary: Lab Results   Phone Note Outgoing Call   Call placed by: Army Fossa CMA,  March 21, 2009 9:01 AM Summary of Call: Regarding lab results, LMTCB:  Ideally your LDL (bad cholesterol) should be <_100___, your HDL (good cholesterol) should be >_50__ and your triglycerides should be< 150.  Diet and exercise will increase HDL and decrease the LDL and triglycerides. Read Dr. Danice Goltz book--Eat Drink and Be Healthy.  LDL has increased since last check.  Start Zocor 40 mg  #30  1 by mouth  at bedtime,  2 refills.   We will recheck labs in _3__ months.  lipid, hep 272.4   Signed by Loreen Freud DO on 03/21/2009 at 12:05 AM  Follow-up for Phone Call        Pt is aware. Army Fossa CMA  March 21, 2009 3:55 PM     New/Updated Medications: ZOCOR 40 MG TABS (SIMVASTATIN) 1 by mouth at bedtime Prescriptions: ZOCOR 40 MG TABS (SIMVASTATIN) 1 by mouth at bedtime  #30 x 2   Entered by:   Army Fossa CMA   Authorized by:   Loreen Freud DO   Signed by:   Army Fossa CMA on 03/21/2009   Method used:   Electronically to        [facility]* (retail)       [address]       [address] Shipping/mail[address]       [place], [place]  [address]       Ph: [phone]       Fax: [phone]   RxID:   [id number]

## 2010-03-12 NOTE — Progress Notes (Signed)
  Phone Note Outgoing Call   Summary of Call: Faxed to [facility] Rella Larve. [name]  Jul 06, 2008 2:13 PM

## 2010-03-12 NOTE — Progress Notes (Signed)
Summary: Additional lab results  Phone Note Outgoing Call Call back at Home Phone [phone]   Call placed by: Shonna Chock,  March 23, 2009 11:48 AM Call placed to: Patient Summary of Call: Left message on machine for patient to return call when avaliable, Reason for call:   vita D low----vita d3 2000u daily--recheck 3 months valproic acid level needs to be faxed to her psych ? who psych Dr. is  .Shonna Chock  March 23, 2009 11:48 AM   Follow-up for Phone Call        Informed pt of this- pt will call on monday with the number to psych. Army Fossa CMA  March 23, 2009 3:11 PM   Additional Follow-up for Phone Call Additional follow up Details #1::        Left message to call back. Army Fossa CMA  March 28, 2009 1:57 PM     Additional Follow-up for Phone Call Additional follow up Details #2::    Spoke with pt- will send to [employer] fax [phone]. Army Fossa CMA  March 28, 2009 2:31 PM

## 2010-03-12 NOTE — Assessment & Plan Note (Signed)
Summary: rash on buttocks/acute   Vital Signs:  Patient Profile:   50 Years Old Female Height:     68.50 inches Weight:      171.25 pounds Temp:     98.3 degrees F oral Pulse rate:   80 / minute Resp:     16 per minute BP sitting:   100 / 70  (right arm)  Pt. in pain?   no  Vitals Entered By: [name] (August 18, 2007 10:47 AM)                  PCP:  Laury Axon  Chief Complaint:  rash on groin and vaginal area x 1 week.  History of Present Illness: Pt here c/o rash in groin for 1 week.  No otc meds used.  + itching in R groin.      Current Allergies: No known allergies   Past Medical History:    Reviewed history from 11/05/2006 and no changes required:       Depression       bipolar       IBS       MVP       GERD       ACNE--- s/p accutane   Family History:    Reviewed history from 11/05/2006 and no changes required:       Family History Lung cancer-- M       esophageal CA-- F       Family History of Colon CA 1st degree relative <60       B-- sarcoid and testicular CA  Social History:    Reviewed history from 11/05/2006 and no changes required:       Married       Former Smoker       Alcohol use-yes       Drug use-no       Regular exercise-no    Review of Systems      See HPI   Physical Exam  General:     Well-developed,well-nourished,in no acute distress; alert,appropriate and cooperative throughout examination Skin:     + errythema in r groin c/w yeast  no other abnormality Cervical Nodes:     No lymphadenopathy noted Psych:     Oriented X3, normally interactive, and good eye contact.      Impression & Recommendations:  Problem # 1:  TINEA CRURIS (ICD-110.3) lotrisone cream two times a day for 2 weeks rto if no better  Complete Medication List: 1)  Flonase 50 Mcg/act Susp (Fluticasone propionate) .... 2 sprays each nostril once daily 2)  Nexium 40 Mg Cpdr (Esomeprazole magnesium) .Marland Kitchen.. 1 po once daily 3)  Wellbutrin Xl 150 Mg  Tb24 (Bupropion hcl) .... Take one tablet three times daily 4)  Depakote Er 500 Mg Tb24 (Divalproex sodium) .... Take one tablet at bedtime 5)  Lotrisone 1-0.05 % Crea (Clotrimazole-betamethasone) .... Apply two times a day for 2 weeks 6)  Fluconazole 150 Mg Tabs (Fluconazole) .Marland Kitchen.. 1 by mouth once daily x1 may repeat in 1 week as needed    Prescriptions: FLUCONAZOLE 150 MG  TABS (FLUCONAZOLE) 1 by mouth once daily x1 may repeat in 1 week as needed  #2 x 0   Entered and Authorized by:   Loreen Freud DO   Signed by:   Loreen Freud DO on 08/18/2007   Method used:   Electronically sent to ...       [facility]*  [address]       [place], [place]  [address]       Ph: [phone]       Fax: [phone]   RxID:   [id number] LOTRISONE 1-0.05 %  CREA (CLOTRIMAZOLE-BETAMETHASONE) apply two times a day for 2 weeks  #30g x 0   Entered and Authorized by:   Loreen Freud DO   Signed by:   Loreen Freud DO on 08/18/2007   Method used:   Electronically sent to ...       [facility]*       [address]       [place], [place]  [address]       Ph: [phone]       Fax: [phone]   RxID:   [id number]  ]

## 2010-03-12 NOTE — Assessment & Plan Note (Signed)
Summary: CPX//VGJ   Vital Signs:  Patient Profile:   50 Years Old Female Height:     68.50 inches Weight:      169.44 pounds Temp:     98.2 degrees F oral Pulse rate:   78 / minute Resp:     16 per minute BP sitting:   110 / 78  (right arm)  Pt. in pain?   no  Vitals Entered By: [name] (October 25, 2007 1:03 PM)               Vision Screening: Left eye w/o correction: 20 / 10 Right Eye w/o correction: 20 / 10 Both eyes w/o correction:  20/ 10        Vision Entered By: [name] (October 25, 2007 1:05 PM)   PCP:  Laury Axon  Chief Complaint:  CPX-NONFASTING LABS.  History of Present Illness: Pt here for CPE and labs.  Pt c/o spot in her mouth and is having loose stools 2-3 x a day.   Pt is having chest pains --last episode was 2 weeks ago.  She has some SOB.  CP lasted 10 min last time but it has lasted hours before.  No indigestion.    Current Allergies (reviewed today): No known allergies   Past Medical History:    Reviewed history from 11/05/2006 and no changes required:       Depression       bipolar       IBS       MVP       GERD       ACNE--- s/p accutane  Past Surgical History:    Reviewed history from 11/05/2006 and no changes required:       Lipoma--R side   Family History:    Reviewed history from 11/05/2006 and no changes required:       Family History Lung cancer-- M       esophageal CA-- F       Family History of Colon CA 1st degree relative <60       B-- sarcoid and testicular CA  Social History:    Reviewed history from 11/05/2006 and no changes required:       Married       Former Smoker       Alcohol use-yes       Drug use-no       Regular exercise-no   Risk Factors:     Has patient --       Felt need to cut down:  no       Been annoyed by complaints:  no       Felt guilty about drinking:  no       Needed eye opener in the morning:  no    Counseled to quit/cut down alcohol use:  no   Review of Systems       See HPI  General      Denies chills, fatigue, fever, loss of appetite, malaise, sleep disorder, sweats, weakness, and weight loss.  Eyes      Denies blurring, discharge, double vision, eye irritation, eye pain, halos, itching, light sensitivity, red eye, vision loss-1 eye, and vision loss-both eyes.      ooptho q2y  ENT      Denies decreased hearing, difficulty swallowing, ear discharge, earache, hoarseness, nasal congestion, nosebleeds, postnasal drainage, ringing in ears, sinus pressure, and sore throat.      dentist q41m  CV  Denies bluish discoloration of lips or nails, chest pain or discomfort, difficulty breathing at night, difficulty breathing while lying down, fainting, fatigue, leg cramps with exertion, lightheadness, near fainting, palpitations, shortness of breath with exertion, swelling of feet, swelling of hands, and weight gain.  Resp      Denies chest discomfort, chest pain with inspiration, cough, coughing up blood, excessive snoring, hypersomnolence, morning headaches, pleuritic, shortness of breath, sputum productive, and wheezing.  GI      Denies abdominal pain, bloody stools, change in bowel habits, constipation, dark tarry stools, diarrhea, excessive appetite, gas, hemorrhoids, indigestion, loss of appetite, nausea, vomiting, vomiting blood, and yellowish skin color.  GU      Denies abnormal vaginal bleeding, decreased libido, discharge, dysuria, genital sores, hematuria, incontinence, nocturia, urinary frequency, and urinary hesitancy.  MS      Denies joint pain, joint redness, joint swelling, loss of strength, low back pain, mid back pain, muscle aches, muscle , cramps, muscle weakness, stiffness, and thoracic pain.  Derm      Denies changes in color of skin, changes in nail beds, dryness, excessive perspiration, flushing, hair loss, insect bite(s), itching, lesion(s), poor wound healing, and rash.  Neuro      Denies brief paralysis, difficulty with  concentration, disturbances in coordination, falling down, headaches, inability to speak, memory loss, numbness, poor balance, seizures, sensation of room spinning, tingling, tremors, visual disturbances, and weakness.  Psych      Denies alternate hallucination ( auditory/visual), anxiety, depression, easily angered, easily tearful, irritability, mental problems, panic attacks, sense of great danger, suicidal thoughts/plans, thoughts of violence, unusual visions or sounds, and thoughts /plans of harming others.      psych---Meredith Baker  Endo      Denies cold intolerance, excessive hunger, excessive thirst, excessive urination, heat intolerance, polyuria, and weight change.  Heme      Denies abnormal bruising, bleeding, enlarge lymph nodes, fevers, pallor, and skin discoloration.  Allergy      Denies hives or rash, itching eyes, persistent infections, seasonal allergies, and sneezing.   Physical Exam  General:     Well-developed,well-nourished,in no acute distress; alert,appropriate and cooperative throughout examination Head:     Normocephalic and atraumatic without obvious abnormalities. No apparent alopecia or balding. Eyes:     vision grossly intact and pupils equal.   Ears:     External ear exam shows no significant lesions or deformities.  Otoscopic examination reveals clear canals, tympanic membranes are intact bilaterally without bulging, retraction, inflammation or discharge. Hearing is grossly normal bilaterally. Nose:     External nasal examination shows no deformity or inflammation. Nasal mucosa are pink and moist without lesions or exudates. Mouth:     Oral mucosa and oropharynx without  suspicious lesions or exudates.  Teeth in good repair.  + flesh bump in R cheek--dentist is watching per pt Neck:     No deformities, masses, or tenderness noted. Chest Wall:     No deformities, masses, or tenderness noted. Breasts:     gyn Lungs:     Normal respiratory effort,  chest expands symmetrically. Lungs are clear to auscultation, no crackles or wheezes. Heart:     normal rate, regular rhythm, and Grade  1-2 /6 systolic ejection murmur.   Abdomen:     Bowel sounds positive,abdomen soft and non-tender without masses, organomegaly or hernias noted. Genitalia:     gyn Msk:     normal ROM, no joint tenderness, no joint swelling, no joint warmth, no redness over  joints, no joint deformities, no joint instability, no crepitation, and no muscle atrophy.   Pulses:     R posterior tibial normal, R dorsalis pedis normal, and R carotid normal.   Extremities:     No clubbing, cyanosis, edema, or deformity noted with normal full range of motion of all joints.   Neurologic:     No cranial nerve deficits noted. Station and gait are normal. Plantar reflexes are down-going bilaterally. DTRs are symmetrical throughout. Sensory, motor and coordinative functions appear intact. Skin:     Intact without suspicious lesions or rashes Cervical Nodes:     No lymphadenopathy noted Psych:     Cognition and judgment appear intact. Alert and cooperative with normal attention span and concentration. No apparent delusions, illusions, hallucinations    Impression & Recommendations:  Problem # 1:  PREVENTIVE HEALTH CARE (ICD-V70.0) check fasting labs  ghm utd pap per gyn  Problem # 2:  LOOSE STOOLS (ICD-787.91) check stool cards. Orders: Gastroenterology Referral (GI) Discussed symptom control and diet. Call if worsening of symptoms or signs of dehydration.   Problem # 3:  CHEST PAIN, HX OF (ICD-V12.50) hx MVP Orders: Cardiology Referral (Cardiology) EKG w/ Interpretation (93[address])   Complete Medication List: 1)  Flonase 50 Mcg/act Susp (Fluticasone propionate) .... 2 sprays each nostril once daily 2)  Nexium 40 Mg Cpdr (Esomeprazole magnesium) .Marland Kitchen.. 1 po once daily 3)  Wellbutrin Xl 150 Mg Tb24 (Bupropion hcl) .... Take one tablet three times daily 4)  Depakote Er 500  Mg Tb24 (Divalproex sodium) .... Take one tablet at bedtime 5)  Yaz 3-0.02 Mg Tabs (Drospirenone-ethinyl estradiol) .... Daily as directed   Patient Instructions: 1)  fasting labs ---V70.0  CBCD, bmp, lipid, TSH, UA, hep, depakote level  311.0   Prescriptions: NEXIUM 40 MG  CPDR (ESOMEPRAZOLE MAGNESIUM) 1 po once daily  #30 x 11   Entered and Authorized by:   Loreen Freud DO   Signed by:   Loreen Freud DO on 10/25/2007   Method used:   Print then Give to Patient   RxID:   [id number] FLONASE 50 MCG/ACT  SUSP (FLUTICASONE PROPIONATE) 2 sprays each nostril once daily  #1 x 11   Entered and Authorized by:   Loreen Freud DO   Signed by:   Loreen Freud DO on 10/25/2007   Method used:   Print then Give to Patient   RxID:   [id number]  ]  EKG  Procedure date:  10/25/2007  Findings:      sinus rhythm 87 bpm   EKG  Procedure date:  10/25/2007  Findings:      sinus rhythm 87 bpm

## 2010-03-12 NOTE — Progress Notes (Signed)
Summary: lmtc 11/01/07  Phone Note Outgoing Call Call back at Home Phone [phone]   Call placed by: [name],  November 01, 2007 1:07 PM Call placed to: Patient Summary of Call: Left message for patient to return call in regards to labs and urine specimen. [name]  November 01, 2007 1:09 PM   Follow-up for Phone Call        patient aware [name]  November 01, 2007 4:41 PM  Follow-up by: [name],  November 01, 2007 4:41 PM    New/Updated Medications: CIPRO 500 MG TABS (CIPROFLOXACIN HCL) Take one tablet twice daily for 5 days.   Prescriptions: CIPRO 500 MG TABS (CIPROFLOXACIN HCL) Take one tablet twice daily for 5 days.  #10 x 0   Entered by:   [name]   Authorized by:   Loreen Freud DO   Signed by:   [name] on 11/01/2007   Method used:   Electronically to        [facility] [phone]* (retail)       [address]       [place], [place]  [address]       Ph: [phone]       Fax: [phone]   RxID:   [id number]

## 2010-03-12 NOTE — Letter (Signed)
Summary: Results Follow-up Letter  [place] [facility]  [address] [place], [place] [address]   Phone: [phone]  Fax: [phone]    07/06/2008        Beverly Schlein. Cole [address] [place], [place]  [address]  Dear [name],   The following are the results of your recent test(s):  Test     Result     Pap Smear    Normal_______  Not Normal_____       Comments: _________________________________________________________ Cholesterol LDL(Bad cholesterol):          Your goal is less than:         HDL (Good cholesterol):        Your goal is more than: _________________________________________________________ Other Tests:   _________________________________________________________  Please call for an appointment Or ___NORMAL MAMMOGRAM______________________________________________________ _________________________________________________________ _________________________________________________________  Sincerely,  [name] [place] [facility]

## 2010-03-12 NOTE — Progress Notes (Signed)
Summary: order for lab - dr Laury Axon  Phone Note Call from Patient Call back at Home Phone [phone]   Caller: Patient Summary of Call: patient is seening psychiatric np she is requesting potassium level to be retested - she feels that is reason for weakness &shakiness Initial call taken by: Okey Regal [place],  November 25, 2007 2:59 PM  Follow-up for Phone Call        Dr. Laury Axon Please advise. [name]  November 25, 2007 3:22 PM  Follow-up by: [name],  November 25, 2007 3:22 PM  Additional Follow-up for Phone Call Additional follow up Details #1::        BMP,  780.79---  It was a little low last check----kcl 20 meq 1 by mouth once daily  #30   1 refill  Additional Follow-up by: Loreen Freud DO,  November 25, 2007 4:48 PM    Additional Follow-up for Phone Call Additional follow up Details #2::    Spoke with patient and rx sent electronically to her pharmacy and aware needs labs for 1 month to recheck potassium. [name]  November 25, 2007 4:56 PM  Follow-up by: [name],  November 25, 2007 4:56 PM  New/Updated Medications: KLOR-CON 20 MEQ PACK (POTASSIUM CHLORIDE) Take one tablet daily KLOR-CON 20 MEQ PACK (POTASSIUM CHLORIDE) Take one tablet daily.   Prescriptions: KLOR-CON 20 MEQ PACK (POTASSIUM CHLORIDE) Take one tablet daily.  #30 x 1   Entered by:   [name]   Authorized by:   Loreen Freud DO   Signed by:   [name] on 11/25/2007   Method used:   Electronically to        [facility] [address] [id number]* (retail)       [address] [place], [place]  [address]       Ph: [phone]       Fax: [phone]   RxID:   [id number] KLOR-CON 20 MEQ PACK (POTASSIUM CHLORIDE) Take one tablet daily  #30 x 1   Entered by:   [name]   Authorized by:   Loreen Freud DO   Signed by:   [name] on 11/25/2007   Method used:   Electronically to        [facility]* (retail)       [address]  [place], [place]  [address]       Ph: [phone]       Fax: [phone]   RxID:   [id number]

## 2010-03-12 NOTE — Progress Notes (Signed)
Summary: pt did not get all lab results  Phone Note Call from Patient Call back at Home Phone [phone]   Caller: Patient Call For: did not get all labs Summary of Call: PT CALLED SHE HAS SAID SHE DID NOT GET ALL OF HER LAB RESULTS JUST THE DEPAKOTE, WOULD LIKE THE REST OF LABS MAILED TO HER  IE; CHOLESTEROL ETC .[name]  November 05, 2007 11:17 AM  Initial call taken by: [name],  November 05, 2007 11:17 AM  Follow-up for Phone Call        Patient has been sent labs. However, we will resend another copy. [name]  November 05, 2007 11:24 AM  Follow-up by: [name],  November 05, 2007 11:24 AM

## 2010-03-12 NOTE — Letter (Signed)
Summary: Primary Care Consult Scheduled Letter  [facility]  [address] [place], [place] [address]   Phone: [phone]  Fax: [phone]      10/26/2007 MRN: [id number]  Beverly Cole [address] [place], [place]  [address]    Dear [name],      We have scheduled an appointment for you.  At the recommendation of Dr.Lowne, we have scheduled you a consult with Dr. Loreta Ave on September 24th at 1:30pm.  Their address is [address] [place]. The office phone number is [phone]. If this appointment day and time is not convenient for you, please feel free to call the office of the doctor you are being referred to at the number listed above and reschedule the appointment.     It is important for you to keep your scheduled appointments. We are here to make sure you are given good patient care. If you have questions or you have made changes to your appointment, please notify us at  [phone], ask for Tiffany.    Thank you,  Patient Care Coordinator [place] [facility]

## 2010-03-12 NOTE — Progress Notes (Signed)
Summary: Beverly Cole AUTH APPROVED  Phone Note Refill Request   Initial call taken by: [name],  November 27, 2006 9:54 AM  Follow-up for Phone Call        RX APPROVED FOR NEXIUM UNTIL 11/27/07.[facility]    PRIOR AUTH FOR RX NEXIUM PENDING  [facility]  Prescriptions: NEXIUM 40 MG  CPDR (ESOMEPRAZOLE MAGNESIUM) 1 po once daily  #30 x 11   Entered by:   [name]   Authorized by:   Loreen Freud DO   Signed by:   [name] on 11/30/2006   Method used:   Historical   RxID:   [id number]

## 2010-03-12 NOTE — Letter (Signed)
Summary: [place] No Show Letter  [place] [facility]  [address] [place], [place] [address]   Phone: [phone]  Fax: [phone]    12/30/2007 MRN: [id number]  NANCEE BROWNRIGG [address] [place], [place]  [address]   Dear Ms. Yorks,   Our records indicate that you missed your scheduled appointment with __________lab   ___________ on _11/19/2009___________.  Please contact this office to reschedule your appointment as soon as possible.  It is important that you keep your scheduled appointments with your physician, so we can provide you the best care possible.  Please be advised that there may be a charge for "no show" appointments.   Sincerely, Regina/[facility] [place] [facility]

## 2010-03-12 NOTE — Progress Notes (Signed)
Summary: rash at grone area  Phone Note Call from Patient Call back at Home Phone [phone]   Caller: Patient Reason for Call: Acute Illness Summary of Call: Dr. Laury Axon Patient has a rash near her grone area for about a week now. Which is itchy, she is unable to wear underwear. I offered an appointment for next available which is july 16th at 9:45am patient refused would like to be seen sooner.  Initial call taken by: Charolette Child,  August 16, 2007 4:34 PM  Follow-up for Phone Call        Patient coming in on Wednesday at 10:45 am  [name]  August 16, 2007 4:38 PM  Follow-up by: [name],  August 16, 2007 4:38 PM

## 2010-03-14 NOTE — Assessment & Plan Note (Signed)
Summary: NEW/ UMR /NWS  #   Vital Signs:  Patient profile:   50 year old female Height:      69 inches Weight:      175.38 pounds BMI:     25.99 O2 Sat:      96 % on Room air Temp:     97.9 degrees F oral Pulse rate:   104 / minute BP sitting:   120 / 82  (left arm) Cuff size:   regular  Vitals Entered By: Zella Ball Ewing CMA Duncan Dull) (February 26, 2010 2:57 PM)  O2 Flow:  Room air CC: New Patient/RE   Primary Care Provider:  Newt Lukes MD  CC:  New Patient/RE.  History of Present Illness: new to me but known to our group, transfer care to [facility], here to est care  c/o chest pain - located substernal chest no radiation of pain to shoulder, neck or back symptoms worse at night when lying flat symptoms exac by stress pain is nonexertional, occurs only at rest +palp symptoms and SOB - ?anxiety similar to prior reflux but taking nexium regularly prior stress test negative, but >15yr ago  dyslipidema - has been on statin for same since 03/2009 - would prefer not to take rx due to feared SE - but no reported adv SE CRF include tobacco hx; no dm, htn or FH age<50  GERD - reports compliance with ongoing medical treatment and no changes in medication dose or frequency. denies adverse side effects related to current therapy.   Bipolar - follows with psyc for med mgmt - currently symptoms and mood are stable -  Preventive Screening-Counseling & Management  Alcohol-Tobacco     Alcohol drinks/day: <1     Alcohol Counseling: not indicated; use of alcohol is not excessive or problematic     Smoking Status: quit > 6 months     Year Started: 1973     Year Quit: 1998     Tobacco Counseling: not to resume use of tobacco products  Caffeine-Diet-Exercise     Does Patient Exercise: no     Exercise Counseling: to improve exercise regimen     Depression Counseling: not indicated; screening negative for depression  Safety-Violence-Falls     Seat Belt Counseling: not  indicated; patient wears seat belts     Helmet Counseling: not indicated; patient wears helmet when riding bicycle/motocycle     Firearm Counseling: not indicated; uses recommended firearm safety measures     Violence Counseling: not indicated; no violence risk noted     Fall Risk Counseling: not indicated; no significant falls noted  Current Medications (verified): 1)  Flonase 50 Mcg/act  Susp (Fluticasone Propionate) .... 2 Sprays Each Nostril Once Daily 2)  Nexium 40 Mg  Cpdr (Esomeprazole Magnesium) .Marland Kitchen.. 1 Po Once Daily 3)  Wellbutrin Xl 150 Mg  Tb24 (Bupropion Hcl) .... Take One Tablet Three Times Daily 4)  Depakote Er 500 Mg  Tb24 (Divalproex Sodium) .... Take One Tablet At Bedtime 5)  Loestrin 24 Fe 1-20 Mg-Mcg Tabs (Norethin Ace-Eth Estrad-Fe) .... Take One Tablet Daily. 6)  Oracea 40 Mg Cpdr (Doxycycline (Rosacea)) .Marland Kitchen.. 1 By Mouth Once Daily 7)  Zocor 40 Mg Tabs (Simvastatin) .Marland Kitchen.. 1 By Mouth At Bedtime  Allergies (verified): No Known Drug Allergies  Past History:  Past Medical History: Depression/bipolar IBS MVP GERD ACNE--- s/p accutane Hyperlipidemia Hypothyroidism  MD roster: gyn - patricia grubb @ [facility] - [facility] psyc - Arts development officer  Past Surgical  History: Lipoma--R side   Family History: Family History Lung cancer-- Mom expired age esophageal CA-- dad expired age Family History of Colon CA 1st degree relative <60 B-- sarcoid and testicular CA  dyslipidemia - mom, dad hypertension - mom  Social History: Married, lives with spouse pt is Engineer, 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 I have reviewed all other systems and they were negative.   Physical Exam  General:  alert, well-developed, well-nourished, and cooperative to examination.   no distress Head:  Normocephalic and atraumatic  without obvious abnormalities. No apparent alopecia or balding. Eyes:  vision grossly intact; pupils equal, round and reactive to light.  conjunctiva and lids normal.    Lungs:  normal respiratory effort, no intercostal retractions or use of accessory muscles; normal breath sounds bilaterally - no crackles and no wheezes.    Heart:  normal rate, regular rhythm, no murmur, and no rub. BLE without edema.  Skin:  left breast with flesh colored SK, 3mm- noninflammed Psych:  Oriented X3, memory intact for recent and remote, normally interactive, good eye contact, not anxious appearing, not depressed appearing, and not agitated.      Impression & Recommendations:  Problem # 1:  CHEST PAIN (ICD-786.50) atypical by hx, exam benign EKG now - no acute changes given dyslipidemia and tobacco hx, feel stress test is approp - refer now likely related to stress/anxiety Orders: EKG w/ Interpretation (93000) Misc. Referral (Misc. Ref)  Problem # 2:  HYPERLIPIDEMIA (ICD-272.4)  med started 03/2009 - lipids imporved on tx but pt concerned about possible adv SE - wouldlike to stop med as few other active CRF - reduce dose now but no stopping med until after cardiac stress test complete further discussion on risk/benefit of statin tx to continue Her updated medication list for this problem includes:    Simvastatin 20 Mg Tabs (Simvastatin) .Marland Kitchen... 1 by mouth at bedtime  Labs Reviewed: SGOT: 23 (07/18/2009)   SGPT: 22 (07/18/2009)   HDL:49.10 (07/18/2009), 46.30 (03/20/2009)  LDL:95 (07/18/2009), DEL (16/11/9602)  Chol:177 (07/18/2009), 209 (03/20/2009)  Trig:166.0 (07/18/2009), 218.0 (03/20/2009)  Orders: Prescription Created Electronically [phone])  Problem # 3:  BIPOLAR DISORDER UNSPECIFIED (ICD-296.80) stable- cont tx as ongoing - med mgmt per psyc  Problem # 4:  GERD (ICD-530.81)  Her updated medication list for this problem includes:    Nexium 40 Mg Cpdr (Esomeprazole magnesium) .Marland Kitchen... 1 po once  daily  Labs Reviewed: Hgb: 13.9 (03/20/2009)   Hct: 42.4 (03/20/2009)  Complete Medication List: 1)  Flonase 50 Mcg/act Susp (Fluticasone propionate) .... 2 sprays each nostril once daily 2)  Nexium 40 Mg Cpdr (Esomeprazole magnesium) .Marland Kitchen.. 1 po once daily 3)  Wellbutrin Xl 150 Mg Tb24 (Bupropion hcl) .... Take one tablet three times daily 4)  Depakote Er 500 Mg Tb24 (Divalproex sodium) .... Take one tablet at bedtime 5)  Loestrin 24 Fe 1-20 Mg-mcg Tabs (Norethin ace-eth estrad-fe) .... Take one tablet daily. 6)  Oracea 40 Mg Cpdr (Doxycycline (rosacea)) .Marland Kitchen.. 1 by mouth once daily 7)  Simvastatin 20 Mg Tabs (Simvastatin) .Marland Kitchen.. 1 by mouth at bedtime 8)  Retin-a 0.025 % Crea (Tretinoin) .... Apply to face once daily as directed  Patient Instructions: 1)  it was good to see you today. 2)  exam and EKG today - no evidence for  heart problems causing chest pain but will make referral for stress test. Our office will contact you regarding this appointment once made.  3)  reduce dose of zocor (simvastatin) to 20mg  once daily 4)  this and other medication refills sent to [facility] 5)  Please schedule a follow-up appointment for medical physical and labs, call sooner if problems.  6)  PPD test placed today - return in 48h to have this read Prescriptions: ORACEA 40 MG CPDR (DOXYCYCLINE (ROSACEA)) 1 by mouth once daily  #90 x 0   Entered and Authorized by:   Newt Lukes MD   Signed by:   Newt Lukes MD on 02/26/2010   Method used:   Electronically to        [facility]* (retail)       [address]       [address] Shipping/mailing       [place], [place]  [address]       Ph: [phone]       Fax: [phone]   RxID:   [id number] NEXIUM 40 MG  CPDR (ESOMEPRAZOLE MAGNESIUM) 1 po once daily  #90 x 3   Entered and Authorized by:   Newt Lukes MD   Signed by:   Newt Lukes MD on 02/26/2010   Method used:   Electronically to        [facility]* (retail)       [address]       [address] Shipping/mailing       [place], [place]  [address]       Ph: [phone]       Fax: [phone]   RxID:   [id number] FLONASE 50 MCG/ACT  SUSP (FLUTICASONE PROPIONATE) 2 sprays each nostril once daily  #3 x 3   Entered and Authorized by:   Newt Lukes MD   Signed by:   Newt Lukes MD on 02/26/2010   Method used:   Electronically to        [facility]* (retail)       [address]       [address] Shipping[address]mail[address]       [place], [place]  [address]       Ph: [phone]       Fax: [phone]   RxID:   [id number] SIMVASTATIN 20 MG TABS (SIMVASTATIN) 1 by mouth at bedtime  #90 x 3   Entered and Authorized by:   Newt Lukes MD   Signed by:   Newt Lukes MD on 02/26/2010   Method used:   Electronically to        [facility]* (retail)       [address]       [address] Shipping/mail[address]       [place], [place]  [address]       Ph: [phone]       Fax: [phone]   RxID:   [id number] RETIN-A 0.025 % CREA (TRETINOIN) apply to face once daily as directed  #1 x 6   Entered and Authorized by:   Newt Lukes MD   Signed by:   Newt Lukes MD on 02/26/2010   Method used:   Electronically to        [facility]* (retail)       [address]  [place], [place]  [address]       Ph: [phone]       Fax: [phone]   RxID:   [id number]    Orders Added: 1)  EKG w/ Interpretation [93000] 2)  Est. Patient Level IV [[address]] 3)  Prescription Created Electronically [G8553] 4)  Misc. Referral [Misc. Ref]   PPD Skin Test (to be given today)

## 2010-03-26 ENCOUNTER — Other Ambulatory Visit: Payer: Self-pay

## 2010-03-27 ENCOUNTER — Encounter: Payer: Self-pay | Admitting: Internal Medicine

## 2010-03-27 ENCOUNTER — Ambulatory Visit (INDEPENDENT_AMBULATORY_CARE_PROVIDER_SITE_OTHER): Payer: Commercial Managed Care - PPO

## 2010-03-27 DIAGNOSIS — Z111 Encounter for screening for respiratory tuberculosis: Secondary | ICD-10-CM

## 2010-03-29 ENCOUNTER — Encounter (INDEPENDENT_AMBULATORY_CARE_PROVIDER_SITE_OTHER): Payer: Self-pay | Admitting: *Deleted

## 2010-04-02 ENCOUNTER — Encounter: Payer: Self-pay | Admitting: Internal Medicine

## 2010-04-03 NOTE — Letter (Signed)
Summary: TB Skin Test  All     ,     Phone:   Fax:           TB Skin Test    Beverly Cole 04/19/1960   Date TB Test Placed:  03/27/2010  L forearm  TB Test Placed by:  Brenton Grills CMA (AAMA)  Lot #:  [id number]       Expiration Date: 07/08/2011  Date TB Test Read:  03/29/2010    Result  5<MM Negative  TB Test Read by: Margaret Pyle, CMA  March 29, 2010 2:17 PM

## 2010-04-03 NOTE — Assessment & Plan Note (Signed)
Summary: tb skin test,Beverly Cole,lb  Nurse Visit   Allergies: No Known Drug Allergies  Immunizations Administered:  PPD Skin Test:    Vaccine Type: PPD    Site: left forearm    Mfr: Sanofi Pasteur    Dose: 0.1 ml    Route: ID    Given by: Brenton Grills CMA (AAMA)    Exp. Date: 07/08/2011    Lot #: [id number]  PPD Results    Date of reading: 03/29/2010    Results: < 5mm    Interpretation: negative  Orders Added: 1)  TB Skin Test [86580] 2)  Admin 1st Vaccine [45409]

## 2010-04-05 ENCOUNTER — Other Ambulatory Visit: Payer: Commercial Managed Care - PPO

## 2010-04-05 ENCOUNTER — Encounter (INDEPENDENT_AMBULATORY_CARE_PROVIDER_SITE_OTHER): Payer: Self-pay | Admitting: *Deleted

## 2010-04-05 ENCOUNTER — Other Ambulatory Visit: Payer: Self-pay | Admitting: Internal Medicine

## 2010-04-05 DIAGNOSIS — Z0000 Encounter for general adult medical examination without abnormal findings: Secondary | ICD-10-CM

## 2010-04-05 LAB — URINALYSIS, ROUTINE W REFLEX MICROSCOPIC
Bilirubin Urine: NEGATIVE
Nitrite: NEGATIVE
Specific Gravity, Urine: 1.02 (ref 1.000–1.030)
Total Protein, Urine: NEGATIVE
Urine Glucose: NEGATIVE
Urobilinogen, UA: 0.2 (ref 0.0–1.0)
pH: 6 (ref 5.0–8.0)

## 2010-04-05 LAB — BASIC METABOLIC PANEL
BUN: 10 mg/dL (ref 6–23)
CO2: 28 mEq/L (ref 19–32)
Calcium: 8.8 mg/dL (ref 8.4–10.5)
Chloride: 105 mEq/L (ref 96–112)
Creatinine, Ser: 0.8 mg/dL (ref 0.4–1.2)
GFR: 80.58 mL/min (ref >60.00)
Glucose, Bld: 75 mg/dL (ref 70–99)
Potassium: 4.1 mEq/L (ref 3.5–5.1)
Sodium: 141 mEq/L (ref 135–145)

## 2010-04-05 LAB — HEPATIC FUNCTION PANEL
ALT: 11 U/L (ref 0–35)
AST: 16 U/L (ref 0–37)
Albumin: 3.4 g/dL — ABNORMAL LOW (ref 3.5–5.2)
Alkaline Phosphatase: 44 U/L (ref 39–117)
Bilirubin, Direct: 0.1 mg/dL (ref 0.0–0.3)
Total Bilirubin: 0.4 mg/dL (ref 0.3–1.2)
Total Protein: 6.6 g/dL (ref 6.0–8.3)

## 2010-04-05 LAB — CBC WITH DIFFERENTIAL/PLATELET
Basophils Absolute: 0 10*3/uL (ref 0.0–0.1)
Basophils Relative: 0.4 % (ref 0.0–3.0)
Eosinophils Absolute: 0.1 10*3/uL (ref 0.0–0.7)
Eosinophils Relative: 0.8 % (ref 0.0–5.0)
HCT: 41.6 % (ref 36.0–46.0)
Hemoglobin: 14.2 g/dL (ref 12.0–15.0)
Lymphocytes Relative: 42.6 % (ref 12.0–46.0)
Lymphs Abs: 4.2 10*3/uL — ABNORMAL HIGH (ref 0.7–4.0)
MCHC: 34 g/dL (ref 30.0–36.0)
MCV: 90.8 fl (ref 78.0–100.0)
Monocytes Absolute: 0.7 10*3/uL (ref 0.1–1.0)
Monocytes Relative: 7.2 % (ref 3.0–12.0)
Neutro Abs: 4.9 10*3/uL (ref 1.4–7.7)
Neutrophils Relative %: 49 % (ref 43.0–77.0)
Platelets: 235 10*3/uL (ref 150.0–400.0)
RBC: 4.59 Mil/uL (ref 3.87–5.11)
RDW: 14.5 % (ref 11.5–14.6)
WBC: 9.9 10*3/uL (ref 4.5–10.5)

## 2010-04-05 LAB — LIPID PANEL
Cholesterol: 170 mg/dL (ref 0–200)
HDL: 40.5 mg/dL (ref >39.00)
LDL Cholesterol: 110 mg/dL — ABNORMAL HIGH (ref 0–99)
Total CHOL/HDL Ratio: 4
Triglycerides: 98 mg/dL (ref 0.0–149.0)
VLDL: 19.6 mg/dL (ref 0.0–40.0)

## 2010-04-05 LAB — TSH: TSH: 4.81 u[IU]/mL (ref 0.35–5.50)

## 2010-04-08 ENCOUNTER — Encounter: Payer: Self-pay | Admitting: Internal Medicine

## 2010-04-08 ENCOUNTER — Ambulatory Visit (INDEPENDENT_AMBULATORY_CARE_PROVIDER_SITE_OTHER): Payer: Commercial Managed Care - PPO | Admitting: Internal Medicine

## 2010-04-08 DIAGNOSIS — R040 Epistaxis: Secondary | ICD-10-CM

## 2010-04-08 DIAGNOSIS — R413 Other amnesia: Secondary | ICD-10-CM | POA: Insufficient documentation

## 2010-04-08 DIAGNOSIS — R259 Unspecified abnormal involuntary movements: Secondary | ICD-10-CM

## 2010-04-10 ENCOUNTER — Encounter: Payer: Commercial Managed Care - PPO | Admitting: Internal Medicine

## 2010-04-18 NOTE — Assessment & Plan Note (Signed)
Summary: PER SPOUSE ER FU---ER/04/02/10--BP ISSUES--STC   Vital Signs:  Patient profile:   50 year old female Height:      69 inches (175.26 cm) Weight:      173.6 pounds (78.91 kg) O2 Sat:      97 % on Room air Temp:     97.8 degrees F (36.56 degrees C) oral Pulse rate:   90 / minute BP sitting:   130 / 78  (left arm) Cuff size:   large  Vitals Entered By: Orlan Leavens RMA (April 08, 2010 10:52 AM)  O2 Flow:  Room air CC: BP Issue Is Patient Diabetic? No Pain Assessment Patient in pain? no        Primary Care Provider:  Newt Lukes MD  CC:  BP Issue.  History of Present Illness: here for ER f/u - [place] due to spont nosebleed in setting of elev BP - no hx either nosebleeed or HTN - no pain in head, sinus, nose or chest no change meds, n o trauma bleeding stopped spont, BP gradually returned to normal w/o intervention no probs since that time  reviewed prior med issues: chest pain - never f/u with referral for stress test as last OV  dyslipidema - has been on statin for same since 03/2009 - dose reduced 02/2010 would prefer not to take rx due to feared SE - but no reported adv SE CRF include tobacco hx; no dm, htn or FH age<50  GERD - reports compliance with ongoing medical treatment and no changes in medication dose or frequency. denies adverse side effects related to current therapy.   Bipolar - follows with psyc for med mgmt - currently symptoms and mood are stable -  c/o memory loss and head tremor, both chronic but progressive  Clinical Review Panels:  Immunizations   Last Tetanus Booster:  Td (05/31/2002)   Last PPD Date Read:  03/29/2010 (03/27/2010)   Last PPD Result:  negative (03/27/2010)  Lipid Management   Cholesterol:  170 (04/05/2010)   LDL (bad choesterol):  110 (04/05/2010)   HDL (good cholesterol):  40.50 (04/05/2010)  CBC   WBC:  9.9 (04/05/2010)   RBC:  4.59 (04/05/2010)   Hgb:  14.2 (04/05/2010)   Hct:  41.6  (04/05/2010)   Platelets:  235.0 (04/05/2010)   MCV  90.8 (04/05/2010)   MCHC  34.0 (04/05/2010)   RDW  14.5 (04/05/2010)   PMN:  49.0 (04/05/2010)   Lymphs:  42.6 (04/05/2010)   Monos:  7.2 (04/05/2010)   Eosinophils:  0.8 (04/05/2010)   Basophil:  0.4 (04/05/2010)  Complete Metabolic Panel   Glucose:  75 (04/05/2010)   Sodium:  141 (04/05/2010)   Potassium:  4.1 (04/05/2010)   Chloride:  105 (04/05/2010)   CO2:  28 (04/05/2010)   BUN:  10 (04/05/2010)   Creatinine:  0.8 (04/05/2010)   Albumin:  3.4 (04/05/2010)   Total Protein:  6.6 (04/05/2010)   Calcium:  8.8 (04/05/2010)   Total Bili:  0.4 (04/05/2010)   Alk Phos:  44 (04/05/2010)   SGPT (ALT):  11 (04/05/2010)   SGOT (AST):  16 (04/05/2010)   Current Medications (verified): 1)  Flonase 50 Mcg/act  Susp (Fluticasone Propionate) .... 2 Sprays Each Nostril Once Daily 2)  Nexium 40 Mg  Cpdr (Esomeprazole Magnesium) .Marland Kitchen.. 1 Po Once Daily 3)  Wellbutrin Xl 150 Mg  Tb24 (Bupropion Hcl) .... Take One Tablet Three Times Daily 4)  Depakote Er 500 Mg  Tb24 (Divalproex Sodium) .... Take  One Tablet At Bedtime 5)  Loestrin 24 Fe 1-20 Mg-Mcg Tabs (Norethin Ace-Eth Estrad-Fe) .... Take One Tablet Daily. 6)  Oracea 40 Mg Cpdr (Doxycycline (Rosacea)) .Marland Kitchen.. 1 By Mouth Once Daily 7)  Simvastatin 20 Mg Tabs (Simvastatin) .Marland Kitchen.. 1 By Mouth At Bedtime 8)  Retin-A 0.025 % Crea (Tretinoin) .... Apply To Face Once Daily As Directed 9)  Vitamin D3 1000 Unit Tabs (Cholecalciferol) .... Take 1 By Mouth Once Daily  Allergies (verified): No Known Drug Allergies  Past History:  Past Medical History: Depression/bipolar IBS MVP GERD ACNE--- s/p accutane Hyperlipidemia Hypothyroidism  MD roster: gyn - patricia grubb @ [facility] - [facility] psyc - meredith baker q 6 mo  Review of Systems  The patient denies fever, weight loss, vision loss, decreased hearing, syncope, peripheral edema, and headaches.    Physical Exam  General:  alert,  well-developed, well-nourished, and cooperative to examination.   no distress - spouse at side Head:  Normocephalic and atraumatic without obvious abnormalities. No apparent alopecia or balding. Eyes:  vision grossly intact; pupils equal, round and reactive to light.  conjunctiva and lids normal.    Ears:  normal pinnae bilaterally, without erythema, swelling, or tenderness to palpation. TMs clear, without effusion, or cerumen impaction. Hearing grossly normal bilaterally  Nose:  External nasal examination shows no deformity or inflammation. Nasal mucosa are pink and moist without lesions or exudates. Mouth:  teeth and gums in good repair; mucous membranes moist, without lesions or ulcers. oropharynx clear without exudate, no erythema.  Lungs:  normal respiratory effort, no intercostal retractions or use of accessory muscles; normal breath sounds bilaterally - no crackles and no wheezes.    Heart:  normal rate, regular rhythm, no murmur, and no rub. BLE without edema.  Neurologic:  alert & oriented X3 and cranial nerves II-XII symetrically intact.  strength normal in all extremities, sensation intact to light touch, and gait normal. speech fluent without dysarthria or aphasia; follows commands with good comprehension.    Impression & Recommendations:  Problem # 1:  EPISTAXIS (ICD-784.7) 1st event, unlikely related to elev BP but will closely observe for recurrent issues or other BP issues saline spray rec  Problem # 2:  ABNORMAL INVOLUNTARY MOVEMENTS (ICD-781.0)  pt reports mild head tremor upon waking -- suspect essential benign but refer to neuro for eval of this with concern for memory loss  Orders: Neurology Referral (Neuro)  Problem # 3:  MEMORY LOSS (ICD-780.93)  Orders: Neurology Referral (Neuro)  Time spent with patient 25 minutes, more than 50% of this time was spent counseling patient on ER eval and BP readings, epistaxis risk factors and plans for neuro eval - also med review  and copx lab review -- plans to resched cpx (was sched for 48h from now)  Complete Medication List: 1)  Flonase 50 Mcg/act Susp (Fluticasone propionate) .... 2 sprays each nostril once daily 2)  Nexium 40 Mg Cpdr (Esomeprazole magnesium) .Marland Kitchen.. 1 po once daily 3)  Wellbutrin Xl 150 Mg Tb24 (Bupropion hcl) .... Take one tablet three times daily 4)  Depakote Er 500 Mg Tb24 (Divalproex sodium) .... 3 tab by mouth at bedtime 5)  Loestrin 24 Fe 1-20 Mg-mcg Tabs (Norethin ace-eth estrad-fe) .... Take one tablet daily. 6)  Oracea 40 Mg Cpdr (Doxycycline (rosacea)) .Marland Kitchen.. 1 by mouth once daily 7)  Simvastatin 20 Mg Tabs (Simvastatin) .Marland Kitchen.. 1 by mouth at bedtime 8)  Retin-a 0.025 % Crea (Tretinoin) .... Apply to face once daily as directed 9)  Vitamin D3 1000 Unit Tabs (Cholecalciferol) .... Take 1 by mouth once daily  Patient Instructions: 1)  it was good to see you today.  2)  [place] records reviewed - 3)  use nasal saline spray AM and at bedtime to keep moisturized -  4)  Check your Blood Pressure regularly. If it is above: 140/90 you should make an appointment. 5)  reschedule physical appointmen as discusssed, call sooner if problems   Orders Added: 1)  Est. Patient Level IV [16109] 2)  Neurology Referral [Neuro]

## 2010-05-23 ENCOUNTER — Other Ambulatory Visit (HOSPITAL_COMMUNITY): Payer: Self-pay | Admitting: Neurology

## 2010-05-23 DIAGNOSIS — R200 Anesthesia of skin: Secondary | ICD-10-CM

## 2010-05-23 DIAGNOSIS — R209 Unspecified disturbances of skin sensation: Secondary | ICD-10-CM

## 2010-05-23 DIAGNOSIS — R251 Tremor, unspecified: Secondary | ICD-10-CM

## 2010-05-27 ENCOUNTER — Ambulatory Visit (HOSPITAL_COMMUNITY)
Admission: RE | Admit: 2010-05-27 | Discharge: 2010-05-27 | Disposition: A | Discharge status: Home / Self Care | Payer: 59 | Source: Ambulatory Visit | Attending: Neurology | Admitting: Neurology

## 2010-05-27 ENCOUNTER — Telehealth: Payer: Self-pay | Admitting: *Deleted

## 2010-05-27 DIAGNOSIS — R040 Epistaxis: Secondary | ICD-10-CM | POA: Insufficient documentation

## 2010-05-27 DIAGNOSIS — R209 Unspecified disturbances of skin sensation: Secondary | ICD-10-CM

## 2010-05-27 DIAGNOSIS — R200 Anesthesia of skin: Secondary | ICD-10-CM

## 2010-05-27 DIAGNOSIS — G9389 Other specified disorders of brain: Secondary | ICD-10-CM | POA: Insufficient documentation

## 2010-05-27 DIAGNOSIS — R251 Tremor, unspecified: Secondary | ICD-10-CM

## 2010-05-27 DIAGNOSIS — R030 Elevated blood-pressure reading, without diagnosis of hypertension: Secondary | ICD-10-CM | POA: Insufficient documentation

## 2010-05-27 NOTE — Telephone Encounter (Signed)
Pt's husband called req OV with Dr Felicity Coyer. She was seen by UC b/c our office had no openings - tx with abx and needs f/u with Dr Felicity Coyer. She is req work in apt w/MD this week.

## 2010-05-28 NOTE — Telephone Encounter (Signed)
Ok to work in with any other doc who has space - i am not here remainder of this week - thanks - if needs to specifically see me, will be next week or later - see my schedule for availability - thanks

## 2010-05-29 NOTE — Telephone Encounter (Signed)
appt w/Dr Jonny Ruiz 4/19

## 2010-05-29 NOTE — Telephone Encounter (Signed)
Elnita Maxwell, can you please help with getting pt scheduled? Thanks

## 2010-05-30 ENCOUNTER — Ambulatory Visit (INDEPENDENT_AMBULATORY_CARE_PROVIDER_SITE_OTHER): Payer: 59 | Admitting: Internal Medicine

## 2010-05-30 ENCOUNTER — Encounter: Payer: Self-pay | Admitting: Internal Medicine

## 2010-05-30 DIAGNOSIS — H6690 Otitis media, unspecified, unspecified ear: Secondary | ICD-10-CM

## 2010-05-30 DIAGNOSIS — H6980 Other specified disorders of Eustachian tube, unspecified ear: Secondary | ICD-10-CM

## 2010-05-30 DIAGNOSIS — B373 Candidiasis of vulva and vagina: Secondary | ICD-10-CM | POA: Insufficient documentation

## 2010-05-30 DIAGNOSIS — H6691 Otitis media, unspecified, right ear: Secondary | ICD-10-CM | POA: Insufficient documentation

## 2010-05-30 DIAGNOSIS — B3731 Acute candidiasis of vulva and vagina: Secondary | ICD-10-CM | POA: Insufficient documentation

## 2010-05-30 MED ORDER — LEVOFLOXACIN 500 MG PO TABS: 500.0000 mg | ORAL_TABLET | Freq: Every day | ORAL | Status: AC

## 2010-05-30 MED ORDER — FLUCONAZOLE 150 MG PO TABS: ORAL_TABLET | ORAL | Status: AC

## 2010-05-30 NOTE — Assessment & Plan Note (Signed)
Mild to mod, for antix course,   to f/u any worsening symptoms or concerns

## 2010-05-30 NOTE — Progress Notes (Signed)
Subjective:    Patient ID: Beverly Cole, female    DOB: 06-19-1960, 50 y.o.   MRN: [id number]  HPI  Here with acute onset mild to mod 2-3 days right earache, low grade temp, HA, general weakness and malaise, with slight nonprod cough, but Pt denies chest pain, increased sob or doe, wheezing, orthopnea, PND, increased LE swelling, palpitations, dizziness or syncope.  Pt denies new neurological symptoms such as new headache, or facial or extremity weakness or numbness    Pt denies polydipsia, polyuria.  Pt states overall good compliance with meds.   Pt denies fever, wt loss, night sweats, loss of appetite, or other constitutional symptoms except with acute onset symtpoms.  Has also popping and crackling to the right ear, but without vertigo, n/v, hearing loss.  Also with mild yeast vaginitis symtpoms starting and she asks for diflucan if also has antibx today.   Past Medical History  Diagnosis Date  . TINEA CRURIS 08/18/2007  . LIPOMA NOS 11/05/2006  . Unspecified hypothyroidism 07/06/2008  . HYPERLIPIDEMIA 03/13/2009  . BIPOLAR DISORDER UNSPECIFIED 03/13/2009  . DEPRESSION 07/23/2006  . GERD 11/05/2006  . ACNE NEC 11/05/2006  . MYALGIA 03/13/2009  . MALAISE AND FATIGUE 07/23/2006  . CERVICAL LYMPHADENOPATHY, LEFT 07/06/2008  . LOOSE STOOLS 10/25/2007  . THIRST 07/23/2006  . Personal history of unspecified circulatory disease 11/05/2006  . IRRITABLE BOWEL SYNDROME, HX OF 11/05/2006  . CHEST PAIN 02/26/2010  . Memory loss 04/08/2010  . Abnormal involuntary movements 04/08/2010  . Epistaxis 04/08/2010   Past Surgical History  Procedure Date  . Lipoma (r) side     reports that she quit smoking about 14 years ago. Her smoking use included Cigarettes. She quit after 30 years of use. She does not have any smokeless tobacco history on file. She reports that she drinks alcohol. She reports that she does not use illicit drugs. family history includes Cancer in her father; Colon cancer in her other; Hyperlipidemia  in her father and mother; Hypertension in her mother; Lung cancer in her mother; Sarcoidosis in her other; and Testicular cancer in her other. No Known Allergies Current Outpatient Prescriptions on File Prior to Visit  Medication Sig Dispense Refill  . Cholecalciferol (VITAMIN D3) 1000 UNITS CAPS Take 1,000 Units by mouth daily.        . divalproex (DEPAKOTE) 500 MG 24 hr tablet Take 500 mg by mouth at bedtime. Take 3 at once at bedtime       . doxycycline (ORACEA) 40 MG capsule Take 40 mg by mouth every morning.        Marland Kitchen esomeprazole (NEXIUM) 40 MG capsule Take 40 mg by mouth daily before breakfast.        . Norethin Ace-Eth Estrad-FE (LOESTRIN 24 FE) 1-20 MG-MCG(24) TABS Take by mouth daily.        . simvastatin (ZOCOR) 20 MG tablet Take 20 mg by mouth at bedtime.        . tretinoin (RETIN-A) 0.025 % cream Apply topically at bedtime.        Marland Kitchen buPROPion (WELLBUTRIN XL) 150 MG 24 hr tablet Take 150 mg by mouth 3 (three) times daily.        . fluticasone (FLONASE) 50 MCG/ACT nasal spray 2 sprays by Nasal route daily.         Review of Systems All otherwise neg per pt     Objective:   Physical Exam BP 132/98  Pulse 104  Temp(Src) 98.2 F (36.8 C) (Oral)  Wt 176 lb 2 oz (79.89 kg)  SpO2 97% Physical Exam  VS noted Constitutional: Pt appears well-developed and well-nourished.  HENT: Head: Normocephalic.  Right tm severe erythema,  Left tm clear, canals bening Right Ear: External ear normal.  Sinus nontender Left Ear: External ear normal. pharynx with midl erythema, no exudate Eyes: Conjunctivae and EOM are normal. Pupils are equal, round, and reactive to light.  Neck: Normal range of motion. Neck supple.  Cardiovascular: Normal rate and regular rhythm.   Pulmonary/Chest: Effort normal and breath sounds normal.  Abd:  Soft, NT, non-distended, + BS Neurological: Pt is alert. No cranial nerve deficit.  Skin: Skin is warm. No erythema.  Psychiatric: Pt behavior is normal. Thought  content normal.         Assessment & Plan:

## 2010-05-30 NOTE — Assessment & Plan Note (Signed)
Mild now, likely to be worse with other tx today - for diflucan asd

## 2010-05-30 NOTE — Patient Instructions (Signed)
Take all new medications as prescribed Continue all other medications as before You can also take Delsym OTC for cough, and/or Mucinex (or it's generic off brand) for congestion  

## 2010-05-30 NOTE — Assessment & Plan Note (Signed)
Mlild to mod, for mucinex otc prn

## 2010-06-03 ENCOUNTER — Encounter: Payer: Self-pay | Admitting: Internal Medicine

## 2010-06-03 ENCOUNTER — Ambulatory Visit (INDEPENDENT_AMBULATORY_CARE_PROVIDER_SITE_OTHER): Payer: 59 | Admitting: Internal Medicine

## 2010-06-03 VITALS — BP 120/82 | HR 95 | Temp 98.7°F | Ht 69.0 in | Wt 177.8 lb

## 2010-06-03 DIAGNOSIS — H60399 Other infective otitis externa, unspecified ear: Secondary | ICD-10-CM

## 2010-06-03 DIAGNOSIS — H6090 Unspecified otitis externa, unspecified ear: Secondary | ICD-10-CM

## 2010-06-03 MED ORDER — HYDROCORTISONE-ACETIC ACID 1-2 % OT SOLN: 5.0000 [drp] | Freq: Three times a day (TID) | OTIC | Status: AC

## 2010-06-03 NOTE — Patient Instructions (Signed)
It was good to see you today. We have reviewed your prior records including labs and tests today Use new ear drops in addition to Levaquin antibiotics  - Your prescription(s) have been submitted to your pharmacy. Please take as directed and contact our office if you believe you are having problem(s) with the medication(s). we'll make referral to ENT for other evaluation. Our office will contact you regarding appointment(s) once made.

## 2010-06-03 NOTE — Progress Notes (Signed)
  Subjective:    Patient ID: Beverly Cole, female    DOB: October 21, 1960, 50 y.o.   MRN: [id number]  HPI Here with L ear pain. Seen last week for R ear pain x 2 visits - at [facility] and OV here by my partner. tx with cipro drops and oral abx - then changed to levaquin when unimproved Today, continued R ear pain but new left ear pain in last 2 days No drainage from either ear, no hearing change No hx same - denies swimming or other water exposure. No vision change, no dysphagia or ST. Past Medical History  Diagnosis Date  . BIPOLAR DISORDER UNSPECIFIED 03/13/2009  . IRRITABLE BOWEL SYNDROME, HX OF 11/05/2006  . DEPRESSION   . GERD   . HYPERLIPIDEMIA   . MYALGIA   . Unspecified hypothyroidism       Review of Systems  Constitutional: Negative for fever.  Respiratory: Negative for cough.         Objective:   Physical Exam BP 120/82  Pulse 95  Temp(Src) 98.7 F (37.1 C) (Oral)  Ht 5\' 9"  (1.753 m)  Wt 177 lb 12.8 oz (80.65 kg)  BMI 26.26 kg/m2  SpO2 96% Physical Exam  VS noted Constitutional: Pt appears well-developed and well-nourished. Nontoxic. HENT: Head: Normocephalic.   Right Ear: External ear normal.  TM hazy - fungal white exudate within canal Left Ear: External ear mild edema diffuse. TM hazy, mild erythema, no obvious effusion Mouth: pharynx with mild erythema, no exudate Eyes: Conjunctivae and EOM are normal. Pupils are equal, round, and reactive to light.  Neck: Normal range of motion. Neck supple. No LAD. Cardiovascular: Normal rate and regular rhythm.   Pulmonary/Chest: Effort normal and breath sounds normal.  Lab Results  Component Value Date   WBC 9.9 04/05/2010   HGB 14.2 04/05/2010   HCT 41.6 04/05/2010   PLT 235.0 04/05/2010   CHOL 170 04/05/2010   TRIG 98.0 04/05/2010   HDL 40.50 04/05/2010   LDLDIRECT 150.2 03/20/2009   ALT 11 04/05/2010   AST 16 04/05/2010   NA 141 04/05/2010   K 4.1 04/05/2010   CL 105 04/05/2010   CREATININE 0.8 04/05/2010   BUN 10  04/05/2010   CO2 28 04/05/2010   TSH 4.81 04/05/2010   HGBA1C 5.3 03/20/2009         Assessment & Plan:  Otitis externa R>L - fungal by exam on right - tx aceto acid drops - ok to cont oral abx for prior dx OM Also refer to ENT for eval and tx given resistance to tx

## 2010-06-04 ENCOUNTER — Encounter: Payer: Self-pay | Admitting: Internal Medicine

## 2010-06-12 ENCOUNTER — Ambulatory Visit (INDEPENDENT_AMBULATORY_CARE_PROVIDER_SITE_OTHER): Payer: 59 | Admitting: Internal Medicine

## 2010-06-12 ENCOUNTER — Encounter: Payer: Self-pay | Admitting: Internal Medicine

## 2010-06-12 VITALS — BP 120/72 | HR 96 | Temp 97.3°F | Ht 69.0 in | Wt 176.8 lb

## 2010-06-12 DIAGNOSIS — E785 Hyperlipidemia, unspecified: Secondary | ICD-10-CM

## 2010-06-12 DIAGNOSIS — Z0000 Encounter for general adult medical examination without abnormal findings: Secondary | ICD-10-CM

## 2010-06-12 MED ORDER — SIMVASTATIN 20 MG PO TABS: 10.0000 mg | ORAL_TABLET | Freq: Every day | ORAL | Status: DC

## 2010-06-12 NOTE — Assessment & Plan Note (Signed)
Pt wishes to reduce statin dose - given fair lipid control, will try reduced dose Recheck 6 mo, sooner if needed Reminded of importance of weight and exercise control to assist with same

## 2010-06-12 NOTE — Patient Instructions (Signed)
It was good to see you today. We have reviewed your prior records including labs and tests today Reduce dose simvastatin by 1/2 - will follow cholesterol to see if this is enough to keep under under good control Medications reviewed, no other changes at this time. Work on lifestyle changes as discussed (low fat, low carb, increased protein diet; improved exercise efforts; weight loss) to control sugar, blood pressure and cholesterol levels and/or reduce risk of developing other medical problems. Look into [email or web address] or other type of food journal to assist you in this process - could also consider weight watchers or gym membership. Please schedule followup in 6 months, call sooner if problems.

## 2010-06-12 NOTE — Progress Notes (Signed)
Subjective:    Patient ID: Beverly Cole, female    DOB: [birth year], 50 y.o.   MRN: [id number]  HPI  patient is here today for annual physical. Patient feels well and has no complaints.  Continued ear pain (right) but improved with tx for fungal otitis externa   Past Medical History  Diagnosis Date  . BIPOLAR DISORDER UNSPECIFIED 03/13/2009  . IRRITABLE BOWEL SYNDROME, HX OF 11/05/2006  . DEPRESSION   . GERD   . HYPERLIPIDEMIA   . MYALGIA   . Unspecified hypothyroidism    Family History  Problem Relation Age of Onset  . Lung cancer Mother   . Hypertension Mother   . Hyperlipidemia Mother   . Cancer Father     Esophageal cancer  . Hyperlipidemia Father   . Colon cancer Other   . Sarcoidosis Other   . Testicular cancer Other    History  Substance Use Topics  . Smoking status: Former Smoker -- 30 years    Types: Cigarettes    Quit date: 02/11/1996  . Smokeless tobacco: Not on file  . Alcohol Use: Yes    Review of Systems  Constitutional: Negative for fever.  Respiratory: Negative for cough and shortness of breath.   Cardiovascular: Negative for chest pain.  Gastrointestinal: Negative for abdominal pain.  Musculoskeletal: Negative for gait problem.  Skin: Negative for rash.  Neurological: Negative for dizziness.  No other specific complaints in a complete review of systems (except as listed in HPI above).      Objective:   Physical Exam BP 120/72  Pulse 96  Temp(Src) 97.3 F (36.3 C) (Oral)  Ht 5\' 9"  (1.753 m)  Wt 176 lb 12.8 oz (80.196 kg)  BMI 26.11 kg/m2  SpO2 97% Physical Exam   Physical Exam  Constitutional: She is oriented to person, place, and time. She appears well-developed and well-nourished. No distress.  HENT: Head: Normocephalic and atraumatic. Ears: no swelling or pain of pinna, mild canal debris on R, no fungal hyphae. TMs hazy, no erythema. Nose: Nose normal.  Mouth/Throat: Oropharynx is clear and moist. No oropharyngeal exudate.  Eyes:  Conjunctivae and EOM are normal. Pupils are equal, round, and reactive to light. No scleral icterus.  Neck: Normal range of motion. Neck supple. No JVD present. No thyromegaly present.  Cardiovascular: Normal rate, regular rhythm and normal heart sounds.  No murmur heard. Pulmonary/Chest: Effort normal and breath sounds normal. No respiratory distress. She has no wheezes.  Abdominal: Soft. Bowel sounds are normal. She exhibits no distension. There is no tenderness.  Musculoskeletal: Normal range of motion. She exhibits no edema.  Neurological: She is alert and oriented to person, place, and time. No cranial nerve deficit. Coordination normal.  Skin: Skin is warm and dry. No rash noted. No erythema.  Psychiatric: She has a normal mood and affect. Her behavior is normal. Judgment and thought content normal.    Lab Results  Component Value Date   WBC 9.9 04/05/2010   HGB 14.2 04/05/2010   HCT 41.6 04/05/2010   PLT 235.0 04/05/2010   CHOL 170 04/05/2010   TRIG 98.0 04/05/2010   HDL 40.50 04/05/2010   LDLDIRECT 150.2 03/20/2009   ALT 11 04/05/2010   AST 16 04/05/2010   NA 141 04/05/2010   K 4.1 04/05/2010   CL 105 04/05/2010   CREATININE 0.8 04/05/2010   BUN 10 04/05/2010   CO2 28 04/05/2010   TSH 4.81 04/05/2010   HGBA1C 5.3 03/20/2009  Assessment & Plan:  CPX - v70.0 - Patient has been counseled on age-appropriate routine health concerns for screening and prevention. These are reviewed and up-to-date. Immunizations are up-to-date or declined. Labs and ECG reviewed (from 03/2010)   Otitis externa R>L - fungal by exam on right - tx aceto acid drops - improved, not resolved - await ENT eval given resistance to tx

## 2010-06-28 NOTE — Op Note (Signed)
NAMEBERNETHA, Beverly Cole                 ACCOUNT NO.:  [id number]   MEDICAL RECORD NO.:  [id number]          PATIENT TYPE:  AMB   LOCATION:  ENDO                         FACILITY:  [facility]   PHYSICIAN:  Anselmo Rod, M.D.  DATE OF BIRTH:  [birth year]   DATE OF PROCEDURE:  10/27/2005  DATE OF DISCHARGE:                                 OPERATIVE REPORT   PROCEDURE:  Screening colonoscopy.   ENDOSCOPIST:  Anselmo Rod, M.D.   INSTRUMENT USED:  Olympus video colonoscope.   INDICATIONS FOR PROCEDURE:  A 50 year old white female with a family history  of colon cancer in a paternal uncle and a paternal aunt with left and right  low quadrant pain, personal history of colonic polyps undergoing a repeat  colonoscopy for blood for blood in stools, rule out recurrent polyps,  masses, etc.   PREPROCEDURE PREPARATION:  Informed consent was procured from the patient.  The patient fasted for 4 hours prior to the procedure and prepped with 32  OsmoPrep pills, 20 of which were given the night prior and 12 the morning of  the procedure. The risks and benefits of the procedure including a 10%  missed rate of cancer and polyp were discussed with the patient as well.   PREPROCEDURE PHYSICAL:  The patient had stable vital signs. Neck supple.  Chest clear to auscultation. S1, S2 regular. Abdomen soft with normal bowel  sounds.   DESCRIPTION OF PROCEDURE:  The patient was placed in the left lateral  decubitus position and sedated with 100 mcg of fentanyl and 10 mg of Versed  in slow incremental doses. Once the patient was adequately sedated and  maintained on low flow oxygen and continuous cardiac monitoring, the Olympus  video colonoscope was advanced from the rectum to the cecum. The appendiceal  orifice and ileocecal valve were clearly visualized and photographed. The  terminal ileum appeared healthy and without lesions. The patient experienced  significant abdominal discomfort with insufflation of  air into the colon  indicating a component of visceral hypersensitivity.  Retroflexion in the  rectum revealed no abnormalities. The patient tolerated the procedure well  without immediate complications.   IMPRESSION:  1. Normal colonoscopy to the terminal ileum. No masses, polyps, erosions,      ulcerations, or diverticula seen.  2. Significant abdominal discomfort with insufflation of air into the      colon indicating a component of visceral hypersensitivity consistent      with irritable bowel syndrome.   RECOMMENDATIONS:  1. Continue high fiber diet with liberal fluid intake.  2. Outpatient followup in the next 2 weeks, will repeat guaiac.  3. Repeat colonoscopy in the next 5 years unless the patient develops any      abnormal symptoms in the interim in which case further recommendations      will be made accordingly.  4. Further recommendations once repeat guaiacs have been done.      Anselmo Rod, M.D.  Electronically Signed     JNM/MEDQ  D:  10/27/2005  T:  10/28/2005  Job:  [id number]   cc:  Cynthia P. Romine, M.D.  Fax: [phone]   Loreen Freud, M.D.  [place] [place]  [place] [address]

## 2010-08-01 ENCOUNTER — Other Ambulatory Visit: Payer: Self-pay | Admitting: Internal Medicine

## 2010-08-01 DIAGNOSIS — Z1231 Encounter for screening mammogram for malignant neoplasm of breast: Secondary | ICD-10-CM

## 2010-08-20 ENCOUNTER — Encounter: Payer: Self-pay | Admitting: Internal Medicine

## 2010-08-26 ENCOUNTER — Ambulatory Visit (INDEPENDENT_AMBULATORY_CARE_PROVIDER_SITE_OTHER): Payer: 59 | Admitting: Internal Medicine

## 2010-08-26 ENCOUNTER — Encounter: Payer: Self-pay | Admitting: Internal Medicine

## 2010-08-26 VITALS — BP 140/82 | HR 95 | Temp 98.1°F | Ht 69.0 in | Wt 172.2 lb

## 2010-08-26 DIAGNOSIS — L708 Other acne: Secondary | ICD-10-CM

## 2010-08-26 DIAGNOSIS — I10 Essential (primary) hypertension: Secondary | ICD-10-CM

## 2010-08-26 MED ORDER — PROPRANOLOL HCL ER 80 MG PO CP24: 80.0000 mg | ORAL_CAPSULE | Freq: Every day | ORAL | Status: DC

## 2010-08-26 MED ORDER — TRETINOIN MICROSPHERE 0.04 % EX GEL: Freq: Every evening | CUTANEOUS | Status: DC | PRN

## 2010-08-26 NOTE — Assessment & Plan Note (Signed)
On chronic suppressive doxy as well as daily retin A -  Also requests refill on "micor" for prn use - erx dpone

## 2010-08-26 NOTE — Progress Notes (Signed)
  Subjective:    Patient ID: Beverly Cole, female    DOB: 1961/02/07, 50 y.o.   MRN: [id number]  HPI Here for elevated BP - hx same but denies rx med for HTN +FH same  Past Medical History  Diagnosis Date  . DEPRESSION   . GERD   . HYPERLIPIDEMIA   . MYALGIA   . Unspecified hypothyroidism   . BIPOLAR DISORDER UNSPECIFIED   . IRRITABLE BOWEL SYNDROME, HX OF     Review of Systems  Cardiovascular: Negative for chest pain, palpitations and leg swelling.  Neurological: Negative for weakness and headaches.       Objective:   Physical Exam BP 140/82  Pulse 95  Temp(Src) 98.1 F (36.7 C) (Oral)  Ht 5\' 9"  (1.753 m)  Wt 172 lb 3.2 oz (78.109 kg)  BMI 25.43 kg/m2  SpO2 97%  BP Readings from Last 3 Encounters:  08/26/10 140/82  06/12/10 120/72  06/03/10 120/82    Physical Exam  Constitutional: She is oriented to person, place, and time. She appears well-developed and well-nourished. No distress.  Neck: Normal range of motion. Neck supple. No JVD present. No thyromegaly present.  Cardiovascular: Normal rate, regular rhythm and normal heart sounds.  No murmur heard. No BLE edema. Pulmonary/Chest: Effort normal and breath sounds normal. No respiratory distress. She has no wheezes.  Neurological: She is alert and oriented to person, place, and time. No cranial nerve deficit. Coordination normal.  Psychiatric: She has a normal mood and affect. Her behavior is normal. Judgment and thought content normal.   Lab Results  Component Value Date   WBC 9.9 04/05/2010   HGB 14.2 04/05/2010   HCT 41.6 04/05/2010   PLT 235.0 04/05/2010   CHOL 170 04/05/2010   TRIG 98.0 04/05/2010   HDL 40.50 04/05/2010   LDLDIRECT 150.2 03/20/2009   ALT 11 04/05/2010   AST 16 04/05/2010   NA 141 04/05/2010   K 4.1 04/05/2010   CL 105 04/05/2010   CREATININE 0.8 04/05/2010   BUN 10 04/05/2010   CO2 28 04/05/2010   TSH 4.81 04/05/2010   HGBA1C 5.3 03/20/2009       Assessment & Plan:  See problem list.  Medications and labs reviewed today.

## 2010-08-26 NOTE — Patient Instructions (Signed)
It was good to see you today. Start inderal LA for blood pressure and tremor symptoms - also retin A micro as requested -  Your prescription(s) have been submitted to your pharmacy. Please take as directed and contact our office if you believe you are having problem(s) with the medication(s). recheck BP in 2-4 weeks - goal 130/85 or less - will titrate or change meds as needed to get to goal

## 2010-08-26 NOTE — Assessment & Plan Note (Signed)
Home BP log reviewed - 140s-180s/80-90s May be recently exac by stress but FH same - recent labs and EKG reviewed Will start low dose inderal now for BP as well as essential tremor (long hx same) Recheck nurse visit 2-4 weeks for titration as/if needed, call sooner if problems

## 2010-08-29 ENCOUNTER — Other Ambulatory Visit: Payer: Self-pay | Admitting: *Deleted

## 2010-08-29 NOTE — Telephone Encounter (Signed)
Pt concerned about Depression side effect of Propanolol ["has a Hx of depression"] and would like to get a alternative Rx.

## 2010-08-29 NOTE — Telephone Encounter (Signed)
Will change propanolol to losartan for BP - this med will not help tremor (like propanolol) but will not cause depression - - inderal dc'd and new erx done for losartan

## 2010-08-30 ENCOUNTER — Telehealth: Payer: Self-pay | Admitting: *Deleted

## 2010-08-30 MED ORDER — TRETINOIN 0.05 % EX CREA: TOPICAL_CREAM | Freq: Every day | CUTANEOUS | Status: DC

## 2010-08-30 MED ORDER — LOSARTAN POTASSIUM 50 MG PO TABS: 50.0000 mg | ORAL_TABLET | Freq: Every day | ORAL | Status: DC

## 2010-08-30 NOTE — Telephone Encounter (Signed)
Pt return call back she states she would like the retin A 0.05%. Also pt states she call 2 days ago left msg concerning BP med. Md rx Inderal and 1 of the side effect is depression. Pt is wanting to know can md change to something else that wouldn't cause depression. Pls advise?

## 2010-08-30 NOTE — Telephone Encounter (Signed)
i defer to pt as she specifically requested retin a micro 0.04% - she is already on another daily retin a - please check with her re: cost and let me know if she wants me to change - thanks

## 2010-08-30 NOTE — Telephone Encounter (Signed)
Changed from 0.04% to 0.05% for $ - see prior phone note re: BP med change

## 2010-08-30 NOTE — Telephone Encounter (Signed)
See prior note md change BP med to Losartan. Notified pt with info.Marland KitchenMarland Kitchen7/20/12@4 :20pm/LMB

## 2010-08-30 NOTE — Telephone Encounter (Signed)
Received fax stating cost of Retin A Micro 0.04% is $120.00. Want to know can they switch to Retin A 0.05% Is this ok?Marland Kitchen..08/30/10@11 :07am/LMB

## 2010-08-30 NOTE — Telephone Encounter (Signed)
Called pt no answer LMOM RTC concerning prescription.Marland KitchenMarland Kitchen7/20/12@2 :38pm/LMB

## 2010-09-03 ENCOUNTER — Ambulatory Visit
Admission: RE | Admit: 2010-09-03 | Discharge: 2010-09-03 | Disposition: A | Discharge status: Home / Self Care | Payer: 59 | Source: Ambulatory Visit | Attending: Internal Medicine | Admitting: Internal Medicine

## 2010-09-03 DIAGNOSIS — Z1231 Encounter for screening mammogram for malignant neoplasm of breast: Secondary | ICD-10-CM

## 2010-10-10 ENCOUNTER — Other Ambulatory Visit (INDEPENDENT_AMBULATORY_CARE_PROVIDER_SITE_OTHER): Payer: 59

## 2010-10-10 ENCOUNTER — Encounter: Payer: Self-pay | Admitting: Internal Medicine

## 2010-10-10 ENCOUNTER — Ambulatory Visit (INDEPENDENT_AMBULATORY_CARE_PROVIDER_SITE_OTHER): Payer: 59 | Admitting: Internal Medicine

## 2010-10-10 VITALS — BP 136/82 | HR 116 | Temp 98.4°F | Ht 69.0 in

## 2010-10-10 DIAGNOSIS — R5381 Other malaise: Secondary | ICD-10-CM

## 2010-10-10 DIAGNOSIS — I10 Essential (primary) hypertension: Secondary | ICD-10-CM

## 2010-10-10 DIAGNOSIS — E039 Hypothyroidism, unspecified: Secondary | ICD-10-CM

## 2010-10-10 DIAGNOSIS — R5383 Other fatigue: Secondary | ICD-10-CM

## 2010-10-10 LAB — BASIC METABOLIC PANEL
BUN: 9 mg/dL (ref 6–23)
CO2: 24 mEq/L (ref 19–32)
Calcium: 8.8 mg/dL (ref 8.4–10.5)
Chloride: 105 mEq/L (ref 96–112)
Creatinine, Ser: 0.8 mg/dL (ref 0.4–1.2)
GFR: 78.15 mL/min (ref >60.00)
Glucose, Bld: 104 mg/dL — ABNORMAL HIGH (ref 70–99)
Potassium: 3.6 mEq/L (ref 3.5–5.1)
Sodium: 140 mEq/L (ref 135–145)

## 2010-10-10 LAB — CBC WITH DIFFERENTIAL/PLATELET
Basophils Absolute: 0 10*3/uL (ref 0.0–0.1)
Basophils Relative: 0.5 % (ref 0.0–3.0)
Eosinophils Absolute: 0.1 10*3/uL (ref 0.0–0.7)
Eosinophils Relative: 0.9 % (ref 0.0–5.0)
HCT: 42.5 % (ref 36.0–46.0)
Hemoglobin: 14.2 g/dL (ref 12.0–15.0)
Lymphocytes Relative: 29.5 % (ref 12.0–46.0)
Lymphs Abs: 2.2 10*3/uL (ref 0.7–4.0)
MCHC: 33.3 g/dL (ref 30.0–36.0)
MCV: 90.6 fl (ref 78.0–100.0)
Monocytes Absolute: 0.4 10*3/uL (ref 0.1–1.0)
Monocytes Relative: 6.1 % (ref 3.0–12.0)
Neutro Abs: 4.6 10*3/uL (ref 1.4–7.7)
Neutrophils Relative %: 63 % (ref 43.0–77.0)
Platelets: 270 10*3/uL (ref 150.0–400.0)
RBC: 4.69 Mil/uL (ref 3.87–5.11)
RDW: 13.9 % (ref 11.5–14.6)
WBC: 7.4 10*3/uL (ref 4.5–10.5)

## 2010-10-10 LAB — HEPATIC FUNCTION PANEL
ALT: 10 U/L (ref 0–35)
AST: 18 U/L (ref 0–37)
Albumin: 3.7 g/dL (ref 3.5–5.2)
Alkaline Phosphatase: 44 U/L (ref 39–117)
Bilirubin, Direct: 0.1 mg/dL (ref 0.0–0.3)
Total Bilirubin: 0.5 mg/dL (ref 0.3–1.2)
Total Protein: 7.2 g/dL (ref 6.0–8.3)

## 2010-10-10 LAB — VITAMIN B12: Vitamin B-12: 427 pg/mL (ref 211–911)

## 2010-10-10 LAB — TSH: TSH: 2.51 u[IU]/mL (ref 0.35–5.50)

## 2010-10-10 NOTE — Progress Notes (Signed)
  Subjective:    Patient ID: Beverly Cole, female    DOB: 1960-05-06, 50 y.o.   MRN: [id number]  HPI  Here for elevated BP - hx same but denies rx med for HTN +FH same associated with near syncope symptoms yesterday - no chest pain, headache, edema or loss of consciousness No focal deficits, dysarthria or numbness Persisting tachycardia - All symptoms  "worse with stress" - work demands Started ARB summer 2012 but stopped after 3days due to "weakness" - Remains "weak" all over despite stopping ARB Denies depression or increase in anxiety symptoms   Past Medical History  Diagnosis Date  . DEPRESSION   . GERD   . HYPERLIPIDEMIA   . MYALGIA   . Unspecified hypothyroidism   . BIPOLAR DISORDER UNSPECIFIED   . IRRITABLE BOWEL SYNDROME, HX OF   . Hypertension     Review of Systems  Constitutional: Positive for fatigue.  Cardiovascular: Negative for chest pain, palpitations and leg swelling.  Neurological: Negative for weakness and headaches.       Objective:   Physical Exam  BP 136/82  Pulse 116  Temp(Src) 98.4 F (36.9 C) (Oral)  Ht 5\' 9"  (1.753 m)  SpO2 97%  BP Readings from Last 3 Encounters:  10/10/10 136/82  08/26/10 140/82  06/12/10 120/72   Constitutional: She appears well-developed and well-nourished. No distress.  Neck: Normal range of motion. Neck supple. No JVD present. No thyromegaly present.  Cardiovascular: Normal rate, regular rhythm and normal heart sounds.  No murmur heard. No BLE edema. Pulmonary/Chest: Effort normal and breath sounds normal. No respiratory distress. She has no wheezes.  Neurological: She is alert and oriented to person, place, and time. No cranial nerve deficit. Coordination normal.  Psychiatric: She has a normal mood and affect. Her behavior is normal. Judgment and thought content normal.   Lab Results  Component Value Date   WBC 9.9 04/05/2010   HGB 14.2 04/05/2010   HCT 41.6 04/05/2010   PLT 235.0 04/05/2010   CHOL 170  04/05/2010   TRIG 98.0 04/05/2010   HDL 40.50 04/05/2010   LDLDIRECT 150.2 03/20/2009   ALT 11 04/05/2010   AST 16 04/05/2010   NA 141 04/05/2010   K 4.1 04/05/2010   CL 105 04/05/2010   CREATININE 0.8 04/05/2010   BUN 10 04/05/2010   CO2 28 04/05/2010   TSH 4.81 04/05/2010   HGBA1C 5.3 03/20/2009       Assessment & Plan:  See problem list. Medications and labs reviewed today.  Fatigue, nonspecific hx and exam - suspect related to "stress" at work - check screening labs now

## 2010-10-10 NOTE — Patient Instructions (Signed)
It was good to see you today. restart inderal LA for blood pressure and tremor symptoms -  recheck BP here in 2-4 weeks (nurse visit) - goal 130/85 or less - will titrate or change meds as needed to get to goal Test(s) ordered today. Your results will be called to you after review (48-72hours after test completion). If any changes need to be made, you will be notified at that time. Please schedule followup in 3 months for blood pressure and medication review, call sooner if problems.

## 2010-10-10 NOTE — Assessment & Plan Note (Signed)
Lab Results  Component Value Date   TSH 4.81 04/05/2010   Check now and consider tx as needed (hx same but off meds for last few years)

## 2010-10-10 NOTE — Assessment & Plan Note (Signed)
Home BP log reviewed - 140s-180s/80-90s May be recently exac by stress but FH same - recent labs and EKG reviewed Declined low dose inderal for blood pressure previously but ready to retry now risk depression side effects reviewed and will accept potential risk for anticipated benefit of tx essential tremor (long hx same), tachy and BP control Recheck nurse visit 2-4 weeks for titration as/if needed, call sooner if problems  BP Readings from Last 3 Encounters:  10/10/10 136/82  08/26/10 140/82  06/12/10 120/72

## 2010-10-21 ENCOUNTER — Other Ambulatory Visit: Payer: Self-pay | Admitting: Internal Medicine

## 2011-03-18 ENCOUNTER — Encounter: Payer: Self-pay | Admitting: Internal Medicine

## 2011-03-25 ENCOUNTER — Other Ambulatory Visit: Payer: Self-pay | Admitting: Internal Medicine

## 2011-04-22 ENCOUNTER — Other Ambulatory Visit: Payer: Self-pay | Admitting: Internal Medicine

## 2011-05-12 ENCOUNTER — Telehealth: Payer: Self-pay | Admitting: Internal Medicine

## 2011-05-12 NOTE — Telephone Encounter (Signed)
Patient's spouse called this morning & stated the patient has been dismissed from the [facility] & would like to Re-establish here with Dr. Laury Axon. Can you review this patient's chart/records and advise if patient was actually dismissed/released and if we are able to see her here at our location.  Thank You

## 2011-05-13 LAB — HM PAP SMEAR: HM Pap smear: NEGATIVE

## 2011-05-14 NOTE — Telephone Encounter (Signed)
Dr. Laury Axon, would you like to re-establish this patient & if so would you see her for lab work only prior to leaving on 05/22/11? Please review & advise Thanks

## 2011-05-14 NOTE — Telephone Encounter (Signed)
Dr.Lowne this patient would like to re-establish with you, will this be ok with you. Please advise    KP

## 2011-05-15 NOTE — Telephone Encounter (Signed)
Ok to make appointment

## 2011-05-15 NOTE — Telephone Encounter (Signed)
Spoke with patient and she did not want to see Dr. Felicity Coyer anymore, she stated for the past month and a half she had been seeing things at night, she stated she has been hallucinating, She Denied headaches,No injury, No depression, no suicidal or homicidal thoughts. She stated she has tried a new supplement from the health food store but she has stopped it for the last 3 weeks but she is still having the hallucinations. She left our office in the past due to the location but she is ok with driving back out here to see Dr.Lowne.     KP

## 2011-05-15 NOTE — Telephone Encounter (Signed)
please scheduled this patient an apt.     KP

## 2011-05-15 NOTE — Telephone Encounter (Signed)
Spoke with Molly Maduro and he stated his wife was dismissed from the office of Dr.Leschber, I tried to get patient to give me details but his information was unclear,  He stated he was told by Judeth Cornfield at our office that his wife was dismissed and we could not get her in until June of this year but he said his wife was having problems and she needs to get labs drawn. I advise him I would need to speak with the patient to get more details, he voiced understanding.  I called patient and left a message for her to call me back.     KP

## 2011-05-16 ENCOUNTER — Ambulatory Visit (INDEPENDENT_AMBULATORY_CARE_PROVIDER_SITE_OTHER): Payer: 59 | Admitting: Family Medicine

## 2011-05-16 ENCOUNTER — Encounter: Payer: Self-pay | Admitting: Family Medicine

## 2011-05-16 VITALS — BP 120/70 | HR 77 | Temp 98.1°F | Wt 181.0 lb

## 2011-05-16 DIAGNOSIS — F319 Bipolar disorder, unspecified: Secondary | ICD-10-CM

## 2011-05-16 DIAGNOSIS — R443 Hallucinations, unspecified: Secondary | ICD-10-CM

## 2011-05-16 MED ORDER — ALPRAZOLAM 0.25 MG PO TABS: 0.2500 mg | ORAL_TABLET | Freq: Two times a day (BID) | ORAL | Status: DC | PRN

## 2011-05-16 NOTE — Patient Instructions (Signed)
Hallucinations and Delusions  You seem to be having hallucinations and/or delusions. You may be hearing voices that no one else can hear. This can seem very real to you. You may be having thoughts and fears that do not make sense to others. This condition can be due to mental disease like schizophrenia. It may be caused by a medical condition, such as an infection or electrolyte disturbance. These symptoms are also seen in drug abusers, especially those who use crack cocaine and amphetamines. Drugs like PCP, LSD, MDMA, peyote, and psilocybin can also cause frightening hallucinations and loss of control.  If your symptoms are due to drug abuse, your mental state should improve as the drug(s) leave your system. Someone you trust should be with you until you are better to protect you and calm your fears. Often tranquilizers are very helpful at controlling hallucinations, anxiety, and destructive behavior. Getting a proper diet and enough sleep is important to recovery. If your symptoms are not due to drugs, or do not improve over several days after stopping drug use, you need further medical or mental health care.  SEEK IMMEDIATE MEDICAL CARE IF:    Your symptoms get worse, especially if you think your life is in danger   You have violent or destructive thoughts.  Recovery is possible, but you have to get proper treatment and avoid drugs that are known to cause you trouble.  Document Released: 03/06/2004 Document Revised: 01/16/2011 Document Reviewed: 01/27/2005  [employer] Patient Information 2012 [employer]

## 2011-05-16 NOTE — Progress Notes (Signed)
  Subjective:    Patient ID: Beverly Cole, female    DOB: 1960/11/10, 51 y.o.   MRN: [id number]  HPI Pt here to reestablish and c/o hallucinations that wake her up.  Psych thought it could be anxiety but wanted her check out by reg physician first.    Review of Systems As above    Objective:   Physical Exam  Constitutional: She is oriented to person, place, and time. She appears well-developed and well-nourished.  Cardiovascular: Normal rate, regular rhythm and normal heart sounds.   No murmur heard. Pulmonary/Chest: Effort normal and breath sounds normal.  Neurological: She is alert and oriented to person, place, and time.  Psychiatric: She has a normal mood and affect. Her behavior is normal. Judgment and thought content normal.          Assessment & Plan:  Hallucinations--only at night,  Never occur during day---usually she wakes up hallucinating                    ? anxiiety----  Xanax ..25 1 po tid prn                              ? Seizure vs sleep disorder---refer to neuro

## 2011-05-19 ENCOUNTER — Other Ambulatory Visit (INDEPENDENT_AMBULATORY_CARE_PROVIDER_SITE_OTHER): Payer: 59

## 2011-05-19 DIAGNOSIS — F319 Bipolar disorder, unspecified: Secondary | ICD-10-CM

## 2011-05-19 DIAGNOSIS — R443 Hallucinations, unspecified: Secondary | ICD-10-CM

## 2011-05-19 LAB — HEPATIC FUNCTION PANEL
ALT: 15 U/L (ref 0–35)
AST: 19 U/L (ref 0–37)
Albumin: 3.5 g/dL (ref 3.5–5.2)
Alkaline Phosphatase: 40 U/L (ref 39–117)
Bilirubin, Direct: 0 mg/dL (ref 0.0–0.3)
Total Bilirubin: 0.3 mg/dL (ref 0.3–1.2)
Total Protein: 6.9 g/dL (ref 6.0–8.3)

## 2011-05-19 LAB — LIPID PANEL
Cholesterol: 188 mg/dL (ref 0–200)
HDL: 39.3 mg/dL (ref >39.00)
Total CHOL/HDL Ratio: 5
Triglycerides: 252 mg/dL — ABNORMAL HIGH (ref 0.0–149.0)
VLDL: 50.4 mg/dL — ABNORMAL HIGH (ref 0.0–40.0)

## 2011-05-19 LAB — CBC WITH DIFFERENTIAL/PLATELET
Basophils Absolute: 0 10*3/uL (ref 0.0–0.1)
Basophils Relative: 0.5 % (ref 0.0–3.0)
Eosinophils Absolute: 0.1 10*3/uL (ref 0.0–0.7)
Eosinophils Relative: 1.7 % (ref 0.0–5.0)
HCT: 41.4 % (ref 36.0–46.0)
Hemoglobin: 13.9 g/dL (ref 12.0–15.0)
Lymphocytes Relative: 39.5 % (ref 12.0–46.0)
Lymphs Abs: 2.7 10*3/uL (ref 0.7–4.0)
MCHC: 33.6 g/dL (ref 30.0–36.0)
MCV: 90.5 fl (ref 78.0–100.0)
Monocytes Absolute: 0.5 10*3/uL (ref 0.1–1.0)
Monocytes Relative: 7.9 % (ref 3.0–12.0)
Neutro Abs: 3.4 10*3/uL (ref 1.4–7.7)
Neutrophils Relative %: 50.4 % (ref 43.0–77.0)
Platelets: 256 10*3/uL (ref 150.0–400.0)
RBC: 4.58 Mil/uL (ref 3.87–5.11)
RDW: 14.4 % (ref 11.5–14.6)
WBC: 6.8 10*3/uL (ref 4.5–10.5)

## 2011-05-19 LAB — BASIC METABOLIC PANEL
BUN: 12 mg/dL (ref 6–23)
CO2: 24 mEq/L (ref 19–32)
Calcium: 9 mg/dL (ref 8.4–10.5)
Chloride: 105 mEq/L (ref 96–112)
Creatinine, Ser: 0.8 mg/dL (ref 0.4–1.2)
GFR: 81.39 mL/min (ref >60.00)
Glucose, Bld: 82 mg/dL (ref 70–99)
Potassium: 3.9 mEq/L (ref 3.5–5.1)
Sodium: 139 mEq/L (ref 135–145)

## 2011-05-19 LAB — LDL CHOLESTEROL, DIRECT: Direct LDL: 123.5 mg/dL

## 2011-05-19 LAB — TSH: TSH: 4.9 u[IU]/mL (ref 0.35–5.50)

## 2011-05-20 LAB — VALPROIC ACID LEVEL: Valproic Acid Lvl: 75.7 ug/mL (ref 50.0–100.0)

## 2011-05-21 NOTE — Progress Notes (Signed)
Pt could not urinate the morning of labs , so took specimen cup home will drop off one day this week.Beverly Cole

## 2011-05-23 LAB — MICROALBUMIN / CREATININE URINE RATIO
Creatinine,U: 160.5 mg/dL
Microalb Creat Ratio: 0.4 mg/g (ref 0.0–30.0)
Microalb, Ur: 0.6 mg/dL (ref 0.0–1.9)

## 2011-07-14 ENCOUNTER — Encounter: Payer: 59 | Admitting: Family Medicine

## 2011-07-21 ENCOUNTER — Other Ambulatory Visit: Payer: Self-pay | Admitting: Internal Medicine

## 2011-07-31 ENCOUNTER — Other Ambulatory Visit: Payer: Self-pay | Admitting: Obstetrics and Gynecology

## 2011-07-31 DIAGNOSIS — R234 Changes in skin texture: Secondary | ICD-10-CM

## 2011-07-31 DIAGNOSIS — N63 Unspecified lump in unspecified breast: Secondary | ICD-10-CM

## 2011-08-07 ENCOUNTER — Other Ambulatory Visit: Payer: 59

## 2011-08-11 ENCOUNTER — Observation Stay (HOSPITAL_COMMUNITY)
Admission: EM | Admit: 2011-08-11 | Discharge: 2011-08-12 | Disposition: A | Discharge status: Home / Self Care | Payer: 59 | Attending: Emergency Medicine | Admitting: Emergency Medicine

## 2011-08-11 ENCOUNTER — Emergency Department (HOSPITAL_COMMUNITY): Payer: 59

## 2011-08-11 ENCOUNTER — Encounter (HOSPITAL_COMMUNITY): Payer: Self-pay

## 2011-08-11 DIAGNOSIS — I059 Rheumatic mitral valve disease, unspecified: Secondary | ICD-10-CM | POA: Insufficient documentation

## 2011-08-11 DIAGNOSIS — E039 Hypothyroidism, unspecified: Secondary | ICD-10-CM | POA: Insufficient documentation

## 2011-08-11 DIAGNOSIS — R209 Unspecified disturbances of skin sensation: Secondary | ICD-10-CM | POA: Insufficient documentation

## 2011-08-11 DIAGNOSIS — R61 Generalized hyperhidrosis: Secondary | ICD-10-CM | POA: Insufficient documentation

## 2011-08-11 DIAGNOSIS — F329 Major depressive disorder, single episode, unspecified: Secondary | ICD-10-CM | POA: Insufficient documentation

## 2011-08-11 DIAGNOSIS — R079 Chest pain, unspecified: Principal | ICD-10-CM | POA: Insufficient documentation

## 2011-08-11 DIAGNOSIS — R0602 Shortness of breath: Secondary | ICD-10-CM | POA: Insufficient documentation

## 2011-08-11 DIAGNOSIS — I10 Essential (primary) hypertension: Secondary | ICD-10-CM | POA: Insufficient documentation

## 2011-08-11 DIAGNOSIS — R110 Nausea: Secondary | ICD-10-CM | POA: Insufficient documentation

## 2011-08-11 DIAGNOSIS — F3289 Other specified depressive episodes: Secondary | ICD-10-CM | POA: Insufficient documentation

## 2011-08-11 DIAGNOSIS — K219 Gastro-esophageal reflux disease without esophagitis: Secondary | ICD-10-CM | POA: Insufficient documentation

## 2011-08-11 LAB — URINALYSIS, ROUTINE W REFLEX MICROSCOPIC
Bilirubin Urine: NEGATIVE
Glucose, UA: NEGATIVE mg/dL
Hgb urine dipstick: NEGATIVE
Ketones, ur: NEGATIVE mg/dL
Nitrite: NEGATIVE
Protein, ur: NEGATIVE mg/dL
Specific Gravity, Urine: 1.02 (ref 1.005–1.030)
Urobilinogen, UA: 0.2 mg/dL (ref 0.0–1.0)
pH: 6 (ref 5.0–8.0)

## 2011-08-11 LAB — COMPREHENSIVE METABOLIC PANEL
ALT: 7 U/L (ref 0–35)
AST: 18 U/L (ref 0–37)
Albumin: 3 g/dL — ABNORMAL LOW (ref 3.5–5.2)
Alkaline Phosphatase: 38 U/L — ABNORMAL LOW (ref 39–117)
BUN: 13 mg/dL (ref 6–23)
CO2: 15 mEq/L — ABNORMAL LOW (ref 19–32)
Calcium: 8.7 mg/dL (ref 8.4–10.5)
Chloride: 104 mEq/L (ref 96–112)
Creatinine, Ser: 0.66 mg/dL (ref 0.50–1.10)
GFR calc Af Amer: >90 mL/min (ref >90)
GFR calc non Af Amer: >90 mL/min (ref >90)
Glucose, Bld: 81 mg/dL (ref 70–99)
Potassium: 4 mEq/L (ref 3.5–5.1)
Sodium: 136 mEq/L (ref 135–145)
Total Bilirubin: 0.2 mg/dL — ABNORMAL LOW (ref 0.3–1.2)
Total Protein: 6.6 g/dL (ref 6.0–8.3)

## 2011-08-11 LAB — URINE MICROSCOPIC-ADD ON

## 2011-08-11 LAB — CBC WITH DIFFERENTIAL/PLATELET
Basophils Absolute: 0 10*3/uL (ref 0.0–0.1)
Basophils Relative: 0 % (ref 0–1)
Eosinophils Absolute: 0.1 10*3/uL (ref 0.0–0.7)
Eosinophils Relative: 2 % (ref 0–5)
HCT: 40.4 % (ref 36.0–46.0)
Hemoglobin: 13.7 g/dL (ref 12.0–15.0)
Lymphocytes Relative: 33 % (ref 12–46)
Lymphs Abs: 2.8 10*3/uL (ref 0.7–4.0)
MCH: 30.2 pg (ref 26.0–34.0)
MCHC: 33.9 g/dL (ref 30.0–36.0)
MCV: 89.2 fL (ref 78.0–100.0)
Monocytes Absolute: 0.7 10*3/uL (ref 0.1–1.0)
Monocytes Relative: 9 % (ref 3–12)
Neutro Abs: 4.8 10*3/uL (ref 1.7–7.7)
Neutrophils Relative %: 56 % (ref 43–77)
Platelets: 237 10*3/uL (ref 150–400)
RBC: 4.53 MIL/uL (ref 3.87–5.11)
RDW: 13.8 % (ref 11.5–15.5)
WBC: 8.5 10*3/uL (ref 4.0–10.5)

## 2011-08-11 LAB — CARDIAC PANEL(CRET KIN+CKTOT+MB+TROPI)
CK, MB: 1.6 ng/mL (ref 0.3–4.0)
Relative Index: INVALID (ref 0.0–2.5)
Total CK: 65 U/L (ref 7–177)
Troponin I: <0.3 ng/mL (ref <0.30)

## 2011-08-11 LAB — POCT I-STAT TROPONIN I
Troponin i, poc: 0 ng/mL (ref 0.00–0.08)
Troponin i, poc: 0 ng/mL (ref 0.00–0.08)
Troponin i, poc: 0 ng/mL (ref 0.00–0.08)

## 2011-08-11 MED ORDER — PROPRANOLOL HCL ER 80 MG PO CP24
80.0000 mg | ORAL_CAPSULE | Freq: Every day | ORAL | Status: DC
  Administered 2011-08-11: 80 mg via ORAL
  Filled 2011-08-11 (×2): qty 1

## 2011-08-11 MED ORDER — DIVALPROEX SODIUM ER 500 MG PO TB24
1500.0000 mg | ORAL_TABLET | Freq: Every day | ORAL | Status: DC
  Administered 2011-08-11: 1500 mg via ORAL
  Filled 2011-08-11: qty 3

## 2011-08-11 MED ORDER — BUPROPION HCL ER (XL) 150 MG PO TB24
150.0000 mg | ORAL_TABLET | Freq: Every day | ORAL | Status: DC
  Administered 2011-08-11: 150 mg via ORAL
  Filled 2011-08-11: qty 1

## 2011-08-11 MED ORDER — METOPROLOL TARTRATE 25 MG PO TABS
100.0000 mg | ORAL_TABLET | Freq: Once | ORAL | Status: AC
  Administered 2011-08-11: 100 mg via ORAL
  Filled 2011-08-11: qty 4

## 2011-08-11 MED ORDER — SODIUM CHLORIDE 0.9 % IV BOLUS (SEPSIS)
1000.0000 mL | Freq: Once | INTRAVENOUS | Status: AC
  Administered 2011-08-11: 1000 mL via INTRAVENOUS

## 2011-08-11 NOTE — ED Notes (Signed)
Pt is back from radiology

## 2011-08-11 NOTE — Progress Notes (Signed)
Observation review is complete. 

## 2011-08-11 NOTE — ED Notes (Signed)
EDP approved for pt to have a meal. Offered pt [place] sandwich, snacks with drink

## 2011-08-11 NOTE — ED Notes (Addendum)
t

## 2011-08-11 NOTE — ED Notes (Signed)
Pt ate dinner.  No complaints voiced.  Pt denies any chest pain.  Skin warm and dry, color appropriate.  Husband at bedside,

## 2011-08-11 NOTE — ED Notes (Signed)
Pt came in by EMS, per ems patient states she started to have chest pain this morning that was sharp and in the center of the chest, pt states she also was having a hard time talking, pt also states she felt nauseous, pt denies SOB pt is on 2l [place] when she came in from EMS pt has nka, EMS gave 3 24 in aspirin and 2 nitro about 20 mins ago. Pt states at this time she is not having any active chest pain right now.EMS reports last B/P 113/68

## 2011-08-11 NOTE — ED Provider Notes (Signed)
History     CSN: [id number]  Arrival date & time 08/11/11  1044   First MD Initiated Contact with Patient 08/11/11 1058      Chief Complaint  Patient presents with  . 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  Pain now resolved.  Has had similar chest pain intermittently in the past which she attributes to MVP.  The second episode today was associated with nausea, SOB, numbness in mouth, diaphoresis.  Symptoms resolved with ASA and NTG.  No previous CAD history.  Reports negative stress test several years ago.  No abdominal pain, back pain, vomiting.  The history is provided by the patient.    Past Medical History  Diagnosis Date  . DEPRESSION   . GERD   . HYPERLIPIDEMIA   . MYALGIA   . Unspecified hypothyroidism   . BIPOLAR DISORDER UNSPECIFIED   . IRRITABLE BOWEL SYNDROME, HX OF   . Hypertension     Past Surgical History  Procedure Date  . Lipoma (r) side     Family History  Problem Relation Age of Onset  . Lung cancer Mother   . Hypertension Mother   . Hyperlipidemia Mother   . Cancer Father     Esophageal cancer  . Hyperlipidemia Father   . Colon cancer Other   . Sarcoidosis Other   . Testicular cancer Other     History  Substance Use Topics  . Smoking status: Former Smoker -- 30 years    Types: Cigarettes    Quit date: 02/11/1996  . Smokeless tobacco: Not on file   Comment: Married, lives with spouse. 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 Alcohol Use: No    OB History    Grav Para Term Preterm Abortions TAB SAB Ect Mult Living                  Review of Systems  Constitutional: Negative for fever, activity change and appetite change.  HENT: Negative for congestion and rhinorrhea.   Respiratory: Positive for chest  tightness and shortness of breath. Negative for cough.   Cardiovascular: Positive for chest pain.  Gastrointestinal: Positive for nausea. Negative for vomiting and abdominal pain.  Genitourinary: Negative for vaginal bleeding and vaginal discharge.  Musculoskeletal: Negative for back pain.  Skin: Negative for rash.  Neurological: Positive for dizziness and light-headedness. Negative for headaches.    Allergies  Review of patient's allergies indicates no known allergies.  Home Medications   Current Outpatient Rx  Name Route Sig Dispense Refill  . BUPROPION HCL ER (XL) 150 MG PO TB24 Oral Take 150 mg by mouth at bedtime.     Marland Kitchen VITAMIN D3 1000 UNITS PO CAPS Oral Take 1,000 Units by mouth at bedtime.     Marland Kitchen DIVALPROEX SODIUM ER 500 MG PO TB24 Oral Take 1,500 mg by mouth at bedtime.     Marland Kitchen ESOMEPRAZOLE MAGNESIUM 40 MG PO CPDR Oral Take 40 mg by mouth at bedtime.    Marland Kitchen FLUTICASONE PROPIONATE 50 MCG/ACT NA SUSP Nasal Place 2 sprays into the nose daily as needed. For allergies    . NORETHIN ACE-ETH ESTRAD-FE 1-20 MG-MCG(24) PO TABS Oral Take 1 tablet by mouth at bedtime.     . ORACEA 40 MG PO CPDR  TAKE 1 CAPSULE BY  MOUTH ONCE DAILY 90 each 0  . PROPRANOLOL HCL ER 80 MG PO CP24 Oral Take 80 mg by mouth at bedtime.  30 capsule 11  . TRETINOIN 0.025 % EX CREA Topical Apply 1 application topically at bedtime. Pt uses both strengths 0.025% and 0.05%    . TRETINOIN 0.05 % EX CREA Topical Apply 1 application topically at bedtime. Pt uses both strengths 0.025% and 0.05%      BP 115/48  Pulse 78  Temp 97.9 F (36.6 C) (Oral)  Resp 22  SpO2 100%  Physical Exam  Constitutional: She is oriented to person, place, and time. She appears well-developed and well-nourished. No distress.  HENT:  Head: Normocephalic and atraumatic.  Mouth/Throat: Oropharynx is clear and moist. No oropharyngeal exudate.  Eyes: Conjunctivae and EOM are normal. Pupils are equal, round, and reactive to light.  Neck: Normal  range of motion. Neck supple.  Cardiovascular: Normal rate, regular rhythm and normal heart sounds.   No murmur heard. Pulmonary/Chest: Effort normal and breath sounds normal. No respiratory distress.  Abdominal: Soft. There is no tenderness. There is no rebound and no guarding.  Musculoskeletal: Normal range of motion. She exhibits no edema and no tenderness.  Neurological: She is alert and oriented to person, place, and time. No cranial nerve deficit.  Skin: Skin is warm.    ED Course  Procedures (including critical care time)  Labs Reviewed  COMPREHENSIVE METABOLIC PANEL - Abnormal; Notable for the following:    CO2 15 (*)     Albumin 3.0 (*)     Alkaline Phosphatase 38 (*)     Total Bilirubin 0.2 (*)     All other components within normal limits  URINALYSIS, ROUTINE W REFLEX MICROSCOPIC - Abnormal; Notable for the following:    APPearance CLOUDY (*)     Leukocytes, UA SMALL (*)     All other components within normal limits  URINE MICROSCOPIC-ADD ON - Abnormal; Notable for the following:    Squamous Epithelial / LPF MANY (*)     Bacteria, UA FEW (*)     All other components within normal limits  CBC WITH DIFFERENTIAL  CARDIAC PANEL(CRET KIN+CKTOT+MB+TROPI)  POCT I-STAT TROPONIN I  POCT I-STAT TROPONIN I   Dg Chest 2 View  08/11/2011  *RADIOLOGY REPORT*  Clinical Data: Left chest pain, dizziness, nausea, history mitral valve prolapse, hypertension, smoking, irritable bowel syndrome, GERD  CHEST - 2 VIEW  Comparison: None  Findings: Normal heart size, mediastinal contours, and pulmonary vascularity. Lungs clear. No pleural effusion or pneumothorax. No acute osseous findings.  IMPRESSION: No acute abnormalities.  Original Report Authenticated By: Lollie Marrow, M.D.     1. Chest pain       MDM  Atypical chest pain with nausea, diaphoresis, numbness, now resolved. 3 episodes today lasting 10-15 minutes each.  EKG nonischemic.  Troponin negative. History not consistent with  PE. Heart score 2. Plan CT coronary in CDU.    Date: 08/11/2011  Rate: 63  Rhythm: normal sinus rhythm  QRS Axis: normal  Intervals: normal  ST/T Wave abnormalities: normal  Conduction Disutrbances:none  Narrative Interpretation:   Old EKG Reviewed: none available    Glynn Octave, MD 08/11/11 [phone]

## 2011-08-11 NOTE — ED Notes (Signed)
Tray ordered for pt.

## 2011-08-11 NOTE — ED Notes (Signed)
Called and gave verbal report to [place], [place]

## 2011-08-11 NOTE — ED Provider Notes (Signed)
4:07 PM Assumed care of patient in the CDU.  Patient is currently on the Chest Pain Protocol.  Patient is to have a Coronary CT in the morning.  Patient presented to the ED today with a chief complaint of chest pain and nausea.  Patient given NG x 2 and aspirin by EMS prior to arrival.  Cardiac risk factors include HTN and hyperlipidemia.    Reassessed patient.  She reports that she is not having any chest pain at this time.  Patient is completely asymptomatic at this time.  Patient alert and orientated x 3, Heart RRR, Lungs CTAB, Abdomen soft and nontender, no LE edema, DP pulse 2+ bilaterally.  8:00 PM Reassessed patient.  Patient reports that she is not having any CP or SOB at this time.  Patient alert and orientated x 3, Heart RRR, Lungs CTAB, Abdomen soft and nontender, no LE edema, DP pulse 2+ bilaterally.  11:00 PM Reassessed patient.  Patient with no CP or SOB.    Pascal Lux [place], PA-C 08/11/11 [phone]

## 2011-08-11 NOTE — ED Notes (Signed)
Pt is currently in radiology. 

## 2011-08-11 NOTE — ED Notes (Signed)
Per pt nurse she was fed 2 [place] sandwiches,per dr rancour permission. Spoke with dr Manus Gunning and advised him pt was to be npo and scan should not be done due pt pt eating . He advises to go ahead and scan pt

## 2011-08-11 NOTE — ED Notes (Signed)
Pt up to bathroom without any problems.  Pt placed back on monitor.

## 2011-08-12 ENCOUNTER — Other Ambulatory Visit (HOSPITAL_COMMUNITY): Payer: 59

## 2011-08-12 ENCOUNTER — Observation Stay (HOSPITAL_COMMUNITY): Payer: 59

## 2011-08-12 MED ORDER — METOPROLOL TARTRATE 25 MG PO TABS
100.0000 mg | ORAL_TABLET | Freq: Once | ORAL | Status: AC
  Administered 2011-08-12: 100 mg via ORAL
  Filled 2011-08-12: qty 4

## 2011-08-12 MED ORDER — IOHEXOL 350 MG/ML SOLN
80.0000 mL | Freq: Once | INTRAVENOUS | Status: AC | PRN
  Administered 2011-08-12: 80 mL via INTRAVENOUS

## 2011-08-12 MED ORDER — NITROGLYCERIN 0.4 MG SL SUBL
SUBLINGUAL_TABLET | SUBLINGUAL | Status: AC
  Administered 2011-08-12: 0.4 mg via SUBLINGUAL
  Filled 2011-08-12: qty 25

## 2011-08-12 MED ORDER — NITROGLYCERIN 0.4 MG SL SUBL
0.4000 mg | SUBLINGUAL_TABLET | Freq: Once | SUBLINGUAL | Status: AC
  Administered 2011-08-12: 0.4 mg via SUBLINGUAL

## 2011-08-12 MED ORDER — METOPROLOL TARTRATE 1 MG/ML IV SOLN
5.0000 mg | Freq: Once | INTRAVENOUS | Status: AC
  Administered 2011-08-12: 5 mg via INTRAVENOUS

## 2011-08-12 MED ORDER — METOPROLOL TARTRATE 1 MG/ML IV SOLN
INTRAVENOUS | Status: AC
  Administered 2011-08-12: 5 mg via INTRAVENOUS
  Filled 2011-08-12: qty 15

## 2011-08-12 NOTE — ED Provider Notes (Signed)
7:28 AM Patient is in CDU under observation, chest pain protocol.  This is a shared visit with Dr Manus Gunning.  No acute events overnight.  Pt denies chest pain at present.  On exam, pt is A&Ox4, NAD, RRR, no m/r/g, CTAB, abd soft, NT, extremities without edema, distal pulses intact and equal bilaterally.  Plan is for coronary CT this morning.  Patient verbalizes understanding and agrees with plan.    9:57 AM Patient remains asymptomatic, feeling very well at present.  Coronary CT is negative, calcium score is zero.  Plan is for discharge home.    10:21 AM Reviewed labs with Dr Bernette Mayers. Plan is for d/c home.   Dillard Cannon [place], [place] 08/12/11 0958  Rise Patience, PA 08/12/11 1021

## 2011-08-12 NOTE — Discharge Instructions (Signed)
Read the information below.  Please call Dr Felicity Coyer today to schedule a close follow up appointment.  If you develop worsening chest pain, shortness of breath, or fevers, return to the ER immediately for a recheck.  You may return to the ER at any time for worsening condition or any new symptoms that concern you.     Hypertension Information As your heart beats, it forces blood through your arteries. This force is your blood pressure. If the pressure is too high, it is called hypertension (HTN) or high blood pressure. HTN is dangerous because you may have it and not know it. High blood pressure may mean that your heart has to work harder to pump blood. Your arteries may be narrow or stiff. The extra work puts you at risk for heart disease, stroke, and other problems.  Blood pressure consists of two numbers, a higher number over a lower, 110/72, for example. It is stated as "110 over 72." The ideal is below 120 for the top number (systolic) and under 80 for the bottom (diastolic).  You should pay close attention to your blood pressure if you have certain conditions such as:  Heart failure.   Prior heart attack.   Diabetes   Chronic kidney disease.   Prior stroke.   Multiple risk factors for heart disease.  To see if you have HTN, your blood pressure should be measured while you are seated with your arm held at the level of the heart. It should be measured at least twice. A one-time elevated blood pressure reading (especially in the Emergency Department) does not mean that you need treatment. There may be conditions in which the blood pressure is different between your right and left arms. It is important to see your caregiver soon for a recheck. Most people have essential hypertension which means that there is not a specific cause. This type of high blood pressure may be lowered by changing lifestyle factors such as:  Stress.   Smoking.   Lack of exercise.   Excessive weight.    Drug/tobacco/alcohol use.   Eating less salt.  Most people do not have symptoms from high blood pressure until it has caused damage to the body. Effective treatment can often prevent, delay or reduce that damage. TREATMENT  Treatment for high blood pressure, when a cause has been identified, is directed at the cause. There are a large number of medications to treat HTN. These fall into several categories, and your caregiver will help you select the medicines that are best for you. Medications may have side effects. You should review side effects with your caregiver. If your blood pressure stays high after you have made lifestyle changes or started on medicines,   Your medication(s) may need to be changed.   Other problems may need to be addressed.   Be certain you understand your prescriptions, and know how and when to take your medicine.   Be sure to follow up with your caregiver within the time frame advised (usually within two weeks) to have your blood pressure rechecked and to review your medications.   If you are taking more than one medicine to lower your blood pressure, make sure you know how and at what times they should be taken. Taking two medicines at the same time can result in blood pressure that is too low.  Document Released: 04/01/2005 Document Revised: 10/09/2010 Document Reviewed: 04/08/2007 [facility] Patient Information 2012 [place], [place].

## 2011-08-12 NOTE — ED Provider Notes (Signed)
Medical screening examination/treatment/procedure(s) were conducted as a shared visit with non-physician practitioner(s) and myself.  I personally evaluated the patient during the encounter   Beverly Octave, MD 08/12/11 1413

## 2011-08-15 ENCOUNTER — Ambulatory Visit
Admission: RE | Admit: 2011-08-15 | Discharge: 2011-08-15 | Disposition: A | Discharge status: Home / Self Care | Payer: 59 | Source: Ambulatory Visit | Attending: Obstetrics and Gynecology | Admitting: Obstetrics and Gynecology

## 2011-08-15 ENCOUNTER — Other Ambulatory Visit: Payer: Self-pay | Admitting: Obstetrics and Gynecology

## 2011-08-15 DIAGNOSIS — N63 Unspecified lump in unspecified breast: Secondary | ICD-10-CM

## 2011-08-15 DIAGNOSIS — R234 Changes in skin texture: Secondary | ICD-10-CM

## 2011-08-15 NOTE — ED Provider Notes (Signed)
History/physical exam/procedure(s) were performed by non-physician practitioner and as supervising physician I was immediately available for consultation/collaboration. I have reviewed all notes and am in agreement with care and plan.   Hilario Quarry, MD 08/15/11 [phone]

## 2011-08-29 ENCOUNTER — Ambulatory Visit (INDEPENDENT_AMBULATORY_CARE_PROVIDER_SITE_OTHER): Payer: 59 | Admitting: Family Medicine

## 2011-08-29 ENCOUNTER — Encounter: Payer: Self-pay | Admitting: Family Medicine

## 2011-08-29 VITALS — BP 120/76 | HR 72 | Temp 97.6°F | Ht 68.5 in | Wt 178.4 lb

## 2011-08-29 DIAGNOSIS — Z0000 Encounter for general adult medical examination without abnormal findings: Secondary | ICD-10-CM

## 2011-08-29 DIAGNOSIS — E785 Hyperlipidemia, unspecified: Secondary | ICD-10-CM

## 2011-08-29 DIAGNOSIS — F319 Bipolar disorder, unspecified: Secondary | ICD-10-CM

## 2011-08-29 DIAGNOSIS — L708 Other acne: Secondary | ICD-10-CM

## 2011-08-29 DIAGNOSIS — I10 Essential (primary) hypertension: Secondary | ICD-10-CM

## 2011-08-29 DIAGNOSIS — L709 Acne, unspecified: Secondary | ICD-10-CM

## 2011-08-29 DIAGNOSIS — H539 Unspecified visual disturbance: Secondary | ICD-10-CM

## 2011-08-29 DIAGNOSIS — R079 Chest pain, unspecified: Secondary | ICD-10-CM

## 2011-08-29 DIAGNOSIS — F3189 Other bipolar disorder: Secondary | ICD-10-CM

## 2011-08-29 LAB — LIPID PANEL
Cholesterol: 268 mg/dL — ABNORMAL HIGH (ref 0–200)
HDL: 41.3 mg/dL (ref >39.00)
Total CHOL/HDL Ratio: 6
Triglycerides: 286 mg/dL — ABNORMAL HIGH (ref 0.0–149.0)
VLDL: 57.2 mg/dL — ABNORMAL HIGH (ref 0.0–40.0)

## 2011-08-29 LAB — LDL CHOLESTEROL, DIRECT: Direct LDL: 184.4 mg/dL

## 2011-08-29 MED ORDER — TRETINOIN 0.025 % EX CREA: 1.0000 "application " | TOPICAL_CREAM | Freq: Every day | CUTANEOUS | Status: DC

## 2011-08-29 MED ORDER — TRETINOIN 0.05 % EX CREA: 1.0000 "application " | TOPICAL_CREAM | Freq: Every day | CUTANEOUS | Status: DC

## 2011-08-29 NOTE — Assessment & Plan Note (Signed)
Repeat today

## 2011-08-29 NOTE — Assessment & Plan Note (Signed)
Per psych 

## 2011-08-29 NOTE — Progress Notes (Signed)
Subjective:     Beverly Cole is a 51 y.o. female and is here for a comprehensive physical exam. The patient reports no problems.  History   Social History  . Marital Status: Married    Spouse Name: N/A    Number of Children: N/A  . Years of Education: N/A   Occupational History  . Not on file.   Social History Main Topics  . Smoking status: Former Smoker -- 30 years    Types: Cigarettes    Quit date: 02/11/1996  . Smokeless tobacco: Not on file   Comment: Married, lives with spouse. 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 Alcohol Use: No  . Drug Use: No  . Sexually Active: Yes    Birth Control/ Protection: Pill   Other Topics Concern  . Not on file   Social History Narrative   Married, lives with spouse. 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 Influenza Vaccine  11/11/2011  . Tetanus/tdap  05/30/2012  . Pap Smear  05/28/2013  . Mammogram  08/14/2013  . Colonoscopy  10/28/2015    The following portions of the patient's history were reviewed and updated as appropriate: allergies, current medications, past family history, past medical history, past social history, past surgical history and problem list.  Review of Systems Review of Systems  Constitutional: Negative for activity change, appetite change and fatigue.  HENT: Negative for hearing loss, congestion, tinnitus and ear discharge.  dentist q24m Eyes: Negative for visual disturbance (see optho q1y -- vision corrected to 20/20 with glasses).  Respiratory: Negative for cough, chest tightness and shortness of breath.   Cardiovascular: Negative for chest pain, palpitations and leg swelling.  Gastrointestinal: Negative for abdominal pain, diarrhea, constipation and abdominal distention.  Genitourinary: Negative for urgency, frequency, decreased urine volume and difficulty urinating.  Musculoskeletal: Negative for back pain, arthralgias and gait problem.  Skin: Negative for  color change, pallor and rash.  Neurological: Negative for dizziness, light-headedness, numbness and headaches.  Hematological: Negative for adenopathy. Does not bruise/bleed easily.  Psychiatric/Behavioral: Negative for suicidal ideas, confusion, sleep disturbance, self-injury, dysphoric mood, decreased concentration and agitation.       Objective:    BP 120/76  Pulse 72  Temp 97.6 F (36.4 C) (Oral)  Ht 5' 8.5" (1.74 m)  Wt 178 lb 6.4 oz (80.922 kg)  BMI 26.73 kg/m2  SpO2 97% General appearance: alert, cooperative, appears stated age and no distress Head: Normocephalic, without obvious abnormality, atraumatic Eyes: conjunctivae/corneas clear. PERRL, EOM's intact. Fundi benign. Ears: normal TM's and external ear canals both ears Nose: Nares normal. Septum midline. Mucosa normal. No drainage or sinus tenderness. Throat: lips, mucosa, and tongue normal; teeth and gums normal Neck: no adenopathy, no carotid bruit, no JVD, supple, symmetrical, trachea midline and thyroid not enlarged, symmetric, no tenderness/mass/nodules Back: symmetric, no curvature. ROM normal. No CVA tenderness. Lungs: clear to auscultation bilaterally Breasts: normal appearance, no masses or tenderness Heart: regular rate and rhythm, S1, S2 normal, no murmur, click, rub or gallop Abdomen: soft, non-tender; bowel sounds normal; no masses,  no organomegaly Pelvic: deferred-fyn Extremities: extremities normal, atraumatic, no cyanosis or edema Pulses: 2+ and symmetric Skin: Skin color, texture, turgor normal. No rashes or lesions Lymph nodes: Cervical, supraclavicular, and axillary nodes normal. Neurologic: Alert and oriented X 3, normal strength and tone. Normal symmetric reflexes. Normal coordination and gait psych---normal    Assessment:    Healthy  female exam.      Plan:    check labs ghm utd See After Visit Summary for Counseling Recommendations

## 2011-08-29 NOTE — Assessment & Plan Note (Signed)
Stable  On no meds 

## 2011-08-29 NOTE — Patient Instructions (Addendum)
Preventive Care for Adults, Female A healthy lifestyle and preventive care can promote health and wellness. Preventive health guidelines for women include the following key practices.  A routine yearly physical is a good way to check with your caregiver about your health and preventive screening. It is a chance to share any concerns and updates on your health, and to receive a thorough exam.   Visit your dentist for a routine exam and preventive care every 6 months. Brush your teeth twice a day and floss once a day. Good oral hygiene prevents tooth decay and gum disease.   The frequency of eye exams is based on your age, health, family medical history, use of contact lenses, and other factors. Follow your caregiver's recommendations for frequency of eye exams.   Eat a healthy diet. Foods like vegetables, fruits, whole grains, low-fat dairy products, and lean protein foods contain the nutrients you need without too many calories. Decrease your intake of foods high in solid fats, added sugars, and salt. Eat the right amount of calories for you.Get information about a proper diet from your caregiver, if necessary.   Regular physical exercise is one of the most important things you can do for your health. Most adults should get at least 150 minutes of moderate-intensity exercise (any activity that increases your heart rate and causes you to sweat) each week. In addition, most adults need muscle-strengthening exercises on 2 or more days a week.   Maintain a healthy weight. The body mass index (BMI) is a screening tool to identify possible weight problems. It provides an estimate of body fat based on height and weight. Your caregiver can help determine your BMI, and can help you achieve or maintain a healthy weight.For adults 20 years and older:   A BMI below 18.5 is considered underweight.   A BMI of 18.5 to 24.9 is normal.   A BMI of 25 to 29.9 is considered overweight.   A BMI of 30 and above is  considered obese.   Maintain normal blood lipids and cholesterol levels by exercising and minimizing your intake of saturated fat. Eat a balanced diet with plenty of fruit and vegetables. Blood tests for lipids and cholesterol should begin at age 20 and be repeated every 5 years. If your lipid or cholesterol levels are high, you are over 50, or you are at high risk for heart disease, you may need your cholesterol levels checked more frequently.Ongoing high lipid and cholesterol levels should be treated with medicines if diet and exercise are not effective.   If you smoke, find out from your caregiver how to quit. If you do not use tobacco, do not start.   If you are pregnant, do not drink alcohol. If you are breastfeeding, be very cautious about drinking alcohol. If you are not pregnant and choose to drink alcohol, do not exceed 1 drink per day. One drink is considered to be 12 ounces (355 mL) of beer, 5 ounces (148 mL) of wine, or 1.5 ounces (44 mL) of liquor.   Avoid use of street drugs. Do not share needles with anyone. Ask for help if you need support or instructions about stopping the use of drugs.   High blood pressure causes heart disease and increases the risk of stroke. Your blood pressure should be checked at least every 1 to 2 years. Ongoing high blood pressure should be treated with medicines if weight loss and exercise are not effective.   If you are 55 to 52   years old, ask your caregiver if you should take aspirin to prevent strokes.   Diabetes screening involves taking a blood sample to check your fasting blood sugar level. This should be done once every 3 years, after age 45, if you are within normal weight and without risk factors for diabetes. Testing should be considered at a younger age or be carried out more frequently if you are overweight and have at least 1 risk factor for diabetes.   Breast cancer screening is essential preventive care for women. You should practice "breast  self-awareness." This means understanding the normal appearance and feel of your breasts and may include breast self-examination. Any changes detected, no matter how small, should be reported to a caregiver. Women in their 20s and 30s should have a clinical breast exam (CBE) by a caregiver as part of a regular health exam every 1 to 3 years. After age 40, women should have a CBE every year. Starting at age 40, women should consider having a mammography (breast X-ray test) every year. Women who have a family history of breast cancer should talk to their caregiver about genetic screening. Women at a high risk of breast cancer should talk to their caregivers about having magnetic resonance imaging (MRI) and a mammography every year.   The Pap test is a screening test for cervical cancer. A Pap test can show cell changes on the cervix that might become cervical cancer if left untreated. A Pap test is a procedure in which cells are obtained and examined from the lower end of the uterus (cervix).   Women should have a Pap test starting at age 21.   Between ages 21 and 29, Pap tests should be repeated every 2 years.   Beginning at age 30, you should have a Pap test every 3 years as long as the past 3 Pap tests have been normal.   Some women have medical problems that increase the chance of getting cervical cancer. Talk to your caregiver about these problems. It is especially important to talk to your caregiver if a new problem develops soon after your last Pap test. In these cases, your caregiver may recommend more frequent screening and Pap tests.   The above recommendations are the same for women who have or have not gotten the vaccine for human papillomavirus (HPV).   If you had a hysterectomy for a problem that was not cancer or a condition that could lead to cancer, then you no longer need Pap tests. Even if you no longer need a Pap test, a regular exam is a good idea to make sure no other problems are  starting.   If you are between ages 65 and 70, and you have had normal Pap tests going back 10 years, you no longer need Pap tests. Even if you no longer need a Pap test, a regular exam is a good idea to make sure no other problems are starting.   If you have had past treatment for cervical cancer or a condition that could lead to cancer, you need Pap tests and screening for cancer for at least 20 years after your treatment.   If Pap tests have been discontinued, risk factors (such as a new sexual partner) need to be reassessed to determine if screening should be resumed.   The HPV test is an additional test that may be used for cervical cancer screening. The HPV test looks for the virus that can cause the cell changes on the cervix.   The cells collected during the Pap test can be tested for HPV. The HPV test could be used to screen women aged 30 years and older, and should be used in women of any age who have unclear Pap test results. After the age of 30, women should have HPV testing at the same frequency as a Pap test.   Colorectal cancer can be detected and often prevented. Most routine colorectal cancer screening begins at the age of 50 and continues through age 75. However, your caregiver may recommend screening at an earlier age if you have risk factors for colon cancer. On a yearly basis, your caregiver may provide home test kits to check for hidden blood in the stool. Use of a small camera at the end of a tube, to directly examine the colon (sigmoidoscopy or colonoscopy), can detect the earliest forms of colorectal cancer. Talk to your caregiver about this at age 50, when routine screening begins. Direct examination of the colon should be repeated every 5 to 10 years through age 75, unless early forms of pre-cancerous polyps or small growths are found.   Hepatitis C blood testing is recommended for all people born from 1945 through 1965 and any individual with known risks for hepatitis C.    Practice safe sex. Use condoms and avoid high-risk sexual practices to reduce the spread of sexually transmitted infections (STIs). STIs include gonorrhea, chlamydia, syphilis, trichomonas, herpes, HPV, and human immunodeficiency virus (HIV). Herpes, HIV, and HPV are viral illnesses that have no cure. They can result in disability, cancer, and death. Sexually active women aged 25 and younger should be checked for chlamydia. Older women with new or multiple partners should also be tested for chlamydia. Testing for other STIs is recommended if you are sexually active and at increased risk.   Osteoporosis is a disease in which the bones lose minerals and strength with aging. This can result in serious bone fractures. The risk of osteoporosis can be identified using a bone density scan. Women ages 65 and over and women at risk for fractures or osteoporosis should discuss screening with their caregivers. Ask your caregiver whether you should take a calcium supplement or vitamin D to reduce the rate of osteoporosis.   Menopause can be associated with physical symptoms and risks. Hormone replacement therapy is available to decrease symptoms and risks. You should talk to your caregiver about whether hormone replacement therapy is right for you.   Use sunscreen with sun protection factor (SPF) of 30 or more. Apply sunscreen liberally and repeatedly throughout the day. You should seek shade when your shadow is shorter than you. Protect yourself by wearing long sleeves, pants, a wide-brimmed hat, and sunglasses year round, whenever you are outdoors.   Once a month, do a whole body skin exam, using a mirror to look at the skin on your back. Notify your caregiver of new moles, moles that have irregular borders, moles that are larger than a pencil eraser, or moles that have changed in shape or color.   Stay current with required immunizations.   Influenza. You need a dose every fall (or winter). The composition of  the flu vaccine changes each year, so being vaccinated once is not enough.   Pneumococcal polysaccharide. You need 1 to 2 doses if you smoke cigarettes or if you have certain chronic medical conditions. You need 1 dose at age 65 (or older) if you have never been vaccinated.   Tetanus, diphtheria, pertussis (Tdap, Td). Get 1 dose of   Tdap vaccine if you are younger than age 65, are over 65 and have contact with an infant, are a healthcare worker, are pregnant, or simply want to be protected from whooping cough. After that, you need a Td booster dose every 10 years. Consult your caregiver if you have not had at least 3 tetanus and diphtheria-containing shots sometime in your life or have a deep or dirty wound.   HPV. You need this vaccine if you are a woman age 26 or younger. The vaccine is given in 3 doses over 6 months.   Measles, mumps, rubella (MMR). You need at least 1 dose of MMR if you were born in 1957 or later. You may also need a second dose.   Meningococcal. If you are age 19 to 21 and a first-year college student living in a residence hall, or have one of several medical conditions, you need to get vaccinated against meningococcal disease. You may also need additional booster doses.   Zoster (shingles). If you are age 60 or older, you should get this vaccine.   Varicella (chickenpox). If you have never had chickenpox or you were vaccinated but received only 1 dose, talk to your caregiver to find out if you need this vaccine.   Hepatitis A. You need this vaccine if you have a specific risk factor for hepatitis A virus infection or you simply wish to be protected from this disease. The vaccine is usually given as 2 doses, 6 to 18 months apart.   Hepatitis B. You need this vaccine if you have a specific risk factor for hepatitis B virus infection or you simply wish to be protected from this disease. The vaccine is given in 3 doses, usually over 6 months.  Preventive Services /  Frequency Ages 19 to 39  Blood pressure check.** / Every 1 to 2 years.   Lipid and cholesterol check.** / Every 5 years beginning at age 20.   Clinical breast exam.** / Every 3 years for women in their 20s and 30s.   Pap test.** / Every 2 years from ages 21 through 29. Every 3 years starting at age 30 through age 65 or 70 with a history of 3 consecutive normal Pap tests.   HPV screening.** / Every 3 years from ages 30 through ages 65 to 70 with a history of 3 consecutive normal Pap tests.   Hepatitis C blood test.** / For any individual with known risks for hepatitis C.   Skin self-exam. / Monthly.   Influenza immunization.** / Every year.   Pneumococcal polysaccharide immunization.** / 1 to 2 doses if you smoke cigarettes or if you have certain chronic medical conditions.   Tetanus, diphtheria, pertussis (Tdap, Td) immunization. / A one-time dose of Tdap vaccine. After that, you need a Td booster dose every 10 years.   HPV immunization. / 3 doses over 6 months, if you are 26 and younger.   Measles, mumps, rubella (MMR) immunization. / You need at least 1 dose of MMR if you were born in 1957 or later. You may also need a second dose.   Meningococcal immunization. / 1 dose if you are age 19 to 21 and a first-year college student living in a residence hall, or have one of several medical conditions, you need to get vaccinated against meningococcal disease. You may also need additional booster doses.   Varicella immunization.** / Consult your caregiver.   Hepatitis A immunization.** / Consult your caregiver. 2 doses, 6 to 18 months   apart.   Hepatitis B immunization.** / Consult your caregiver. 3 doses usually over 6 months.  Ages 40 to 64  Blood pressure check.** / Every 1 to 2 years.   Lipid and cholesterol check.** / Every 5 years beginning at age 20.   Clinical breast exam.** / Every year after age 40.   Mammogram.** / Every year beginning at age 40 and continuing for as  long as you are in good health. Consult with your caregiver.   Pap test.** / Every 3 years starting at age 30 through age 65 or 70 with a history of 3 consecutive normal Pap tests.   HPV screening.** / Every 3 years from ages 30 through ages 65 to 70 with a history of 3 consecutive normal Pap tests.   Fecal occult blood test (FOBT) of stool. / Every year beginning at age 50 and continuing until age 75. You may not need to do this test if you get a colonoscopy every 10 years.   Flexible sigmoidoscopy or colonoscopy.** / Every 5 years for a flexible sigmoidoscopy or every 10 years for a colonoscopy beginning at age 50 and continuing until age 75.   Hepatitis C blood test.** / For all people born from 1945 through 1965 and any individual with known risks for hepatitis C.   Skin self-exam. / Monthly.   Influenza immunization.** / Every year.   Pneumococcal polysaccharide immunization.** / 1 to 2 doses if you smoke cigarettes or if you have certain chronic medical conditions.   Tetanus, diphtheria, pertussis (Tdap, Td) immunization.** / A one-time dose of Tdap vaccine. After that, you need a Td booster dose every 10 years.   Measles, mumps, rubella (MMR) immunization. / You need at least 1 dose of MMR if you were born in 1957 or later. You may also need a second dose.   Varicella immunization.** / Consult your caregiver.   Meningococcal immunization.** / Consult your caregiver.   Hepatitis A immunization.** / Consult your caregiver. 2 doses, 6 to 18 months apart.   Hepatitis B immunization.** / Consult your caregiver. 3 doses, usually over 6 months.  Ages 65 and over  Blood pressure check.** / Every 1 to 2 years.   Lipid and cholesterol check.** / Every 5 years beginning at age 20.   Clinical breast exam.** / Every year after age 40.   Mammogram.** / Every year beginning at age 40 and continuing for as long as you are in good health. Consult with your caregiver.   Pap test.** /  Every 3 years starting at age 30 through age 65 or 70 with a 3 consecutive normal Pap tests. Testing can be stopped between 65 and 70 with 3 consecutive normal Pap tests and no abnormal Pap or HPV tests in the past 10 years.   HPV screening.** / Every 3 years from ages 30 through ages 65 or 70 with a history of 3 consecutive normal Pap tests. Testing can be stopped between 65 and 70 with 3 consecutive normal Pap tests and no abnormal Pap or HPV tests in the past 10 years.   Fecal occult blood test (FOBT) of stool. / Every year beginning at age 50 and continuing until age 75. You may not need to do this test if you get a colonoscopy every 10 years.   Flexible sigmoidoscopy or colonoscopy.** / Every 5 years for a flexible sigmoidoscopy or every 10 years for a colonoscopy beginning at age 50 and continuing until age 75.   Hepatitis   C blood test.** / For all people born from 1945 through 1965 and any individual with known risks for hepatitis C.   Osteoporosis screening.** / A one-time screening for women ages 65 and over and women at risk for fractures or osteoporosis.   Skin self-exam. / Monthly.   Influenza immunization.** / Every year.   Pneumococcal polysaccharide immunization.** / 1 dose at age 65 (or older) if you have never been vaccinated.   Tetanus, diphtheria, pertussis (Tdap, Td) immunization. / A one-time dose of Tdap vaccine if you are over 65 and have contact with an infant, are a healthcare worker, or simply want to be protected from whooping cough. After that, you need a Td booster dose every 10 years.   Varicella immunization.** / Consult your caregiver.   Meningococcal immunization.** / Consult your caregiver.   Hepatitis A immunization.** / Consult your caregiver. 2 doses, 6 to 18 months apart.   Hepatitis B immunization.** / Check with your caregiver. 3 doses, usually over 6 months.  ** Family history and personal history of risk and conditions may change your caregiver's  recommendations. Document Released: 03/25/2001 Document Revised: 01/16/2011 Document Reviewed: 06/24/2010 [employer] Patient Information 2012 [employer]. 

## 2011-09-29 ENCOUNTER — Institutional Professional Consult (permissible substitution): Payer: 59 | Admitting: Cardiovascular Disease

## 2011-10-08 ENCOUNTER — Ambulatory Visit (INDEPENDENT_AMBULATORY_CARE_PROVIDER_SITE_OTHER): Payer: 59 | Admitting: Cardiovascular Disease

## 2011-10-08 ENCOUNTER — Encounter: Payer: Self-pay | Admitting: Cardiovascular Disease

## 2011-10-08 VITALS — BP 130/80 | HR 60 | Resp 12 | Ht 69.0 in | Wt 175.0 lb

## 2011-10-08 DIAGNOSIS — I059 Rheumatic mitral valve disease, unspecified: Secondary | ICD-10-CM

## 2011-10-08 DIAGNOSIS — I341 Nonrheumatic mitral (valve) prolapse: Secondary | ICD-10-CM

## 2011-10-08 DIAGNOSIS — R002 Palpitations: Secondary | ICD-10-CM

## 2011-10-08 NOTE — Progress Notes (Signed)
History of Present Illness: 51 yo WF with history of HLD, HTN, Bipolar disorder, IBS, Mitral valve prolapse here today to establish cardiology care. She was diagnosed with MVP in the 1980s. She had a f/u echo in 1997 which she reports showed mild MR. She tells me that she has been having sharp pains in her chest, occasionally associated with SOB. These episodes last for a few minutes. She was seen in the ED at [facility] 08/11/11 and had a coronary CT 08/12/11 which showed normal coronary arteries. Also notes skipped heart beats several times per week. This is described as a pause and feeling of SOB. Lasts a few seconds. No prolonged episodes.   Primary Care Physician: Loreen Freud   Past Medical History  Diagnosis Date  . DEPRESSION   . GERD   . HYPERLIPIDEMIA   . MYALGIA   . BIPOLAR DISORDER UNSPECIFIED   . IRRITABLE BOWEL SYNDROME, HX OF   . Hypertension     Past Surgical History  Procedure Date  . Lipoma (r) side 1990    Right back    Current Outpatient Prescriptions  Medication Sig Dispense Refill  . buPROPion (WELLBUTRIN XL) 150 MG 24 hr tablet Take 150 mg by mouth at bedtime.       . Cholecalciferol (VITAMIN D3) 1000 UNITS CAPS Take 1,000 Units by mouth at bedtime.       . divalproex (DEPAKOTE) 500 MG 24 hr tablet Take 1,500 mg by mouth at bedtime.       Marland Kitchen esomeprazole (NEXIUM) 40 MG capsule Take 40 mg by mouth at bedtime.      . fluticasone (FLONASE) 50 MCG/ACT nasal spray Place 2 sprays into the nose daily as needed. For allergies      . Norethin Ace-Eth Estrad-FE (LOESTRIN 24 FE) 1-20 MG-MCG(24) TABS Take 1 tablet by mouth at bedtime.       . ORACEA 40 MG capsule TAKE 1 CAPSULE BY MOUTH ONCE DAILY  90 each  0  . propranolol (INDERAL LA) 80 MG 24 hr capsule Take 80 mg by mouth at bedtime.   30 capsule  11  . tretinoin (RETIN-A) 0.025 % cream Apply 1 application topically at bedtime. Pt uses both strengths 0.025% and 0.05%  45 g  5  . tretinoin (RETIN-A) 0.05 % cream Apply 1  application topically at bedtime. Pt uses both strengths 0.025% and 0.05%  45 g  5    No Known Allergies  History   Social History  . Marital Status: Married    Spouse Name: N/A    Number of Children: 0  . Years of Education: N/A   Occupational History  . 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 Smoking status: Former Smoker -- 1.0 packs/day for 25 years    Types: Cigarettes    Quit date: 02/11/1996  . Smokeless tobacco: Not on file   Comment: Married, lives with spouse. 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 Alcohol Use: No  . Drug Use: No  . Sexually Active: Yes    Birth Control/ Protection: Pill   Other Topics Concern  . Not on file   Social History Narrative   Married, lives with spouse. 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 Lung cancer Mother   . Hypertension Mother   . Hyperlipidemia Mother   . Cancer Father     Esophageal cancer  .  Hyperlipidemia Father   . Colon cancer Other   . Sarcoidosis Other   . Testicular cancer Other     Review of Systems:  As stated in the HPI and otherwise negative.   Resp 12  Ht 5\' 9"  (1.753 m)  Wt 175 lb (79.379 kg)  BMI 25.84 kg/m2  Physical Examination: General: Well developed, well nourished, NAD HEENT: OP clear, mucus membranes moist SKIN: warm, dry. No rashes. Neuro: No focal deficits Musculoskeletal: Muscle strength 5/5 all ext Psychiatric: Mood and affect normal Neck: No JVD, no carotid bruits, no thyromegaly, no lymphadenopathy. Lungs:Clear bilaterally, no wheezes, rhonci, crackles Cardiovascular: Regular rate and rhythm. No murmurs, gallops or rubs. Abdomen:Soft. Bowel sounds present. Non-tender.  Extremities: No lower extremity edema. Pulses are 2 + in the bilateral DP/PT.  EKG: 08/11/11: NSR, rate 63 bpm. Normal EKG.   Assessment and Plan:   1. MVP: History of MVP since 1980s. Will get echo to assess mitral valve.   2.  Palpitations: Will have her wear a 48 hour holter monitor. Likely premature beats. No prolonged episodes.   3. Chest pain: Atypical. Normal coronaries by CTA last month. No further workup.

## 2011-10-08 NOTE — Patient Instructions (Addendum)
Your physician recommends that you schedule a follow-up appointment in:  3-4 weeks.   Your physician has requested that you have an echocardiogram. Echocardiography is a painless test that uses sound waves to create images of your heart. It provides your doctor with information about the size and shape of your heart and how well your heart's chambers and valves are working. This procedure takes approximately one hour. There are no restrictions for this procedure.   Your physician has recommended that you wear a holter monitor. Holter monitors are medical devices that record the heart's electrical activity. Doctors most often use these monitors to diagnose arrhythmias. Arrhythmias are problems with the speed or rhythm of the heartbeat. The monitor is a small, portable device. You can wear one while you do your normal daily activities. This is usually used to diagnose what is causing palpitations/syncope (passing out).   

## 2011-10-14 ENCOUNTER — Other Ambulatory Visit: Payer: Self-pay | Admitting: Internal Medicine

## 2011-10-14 ENCOUNTER — Other Ambulatory Visit: Payer: Self-pay | Admitting: Family Medicine

## 2011-10-14 DIAGNOSIS — I10 Essential (primary) hypertension: Secondary | ICD-10-CM

## 2011-10-14 MED ORDER — ESOMEPRAZOLE MAGNESIUM 40 MG PO CPDR: 40.0000 mg | DELAYED_RELEASE_CAPSULE | Freq: Every day | ORAL | Status: DC

## 2011-10-14 MED ORDER — PROPRANOLOL HCL ER 80 MG PO CP24: 80.0000 mg | ORAL_CAPSULE | Freq: Every day | ORAL | Status: DC

## 2011-10-14 NOTE — Telephone Encounter (Signed)
Refills x  2 last ov 7.19.13 V70  1-Propranolol ER  80 MG Take 1-capsule by mouth daily , # 30 last fill 7.5.13  2-NEXIUM DR 40 MG Take 1-capsule by mouth at daily,  #90 last fill 5.29.13

## 2011-10-15 ENCOUNTER — Telehealth: Payer: Self-pay | Admitting: Cardiovascular Disease

## 2011-10-15 NOTE — Telephone Encounter (Signed)
New Problem:    Patient called in wanting to know when her Holtor monitor placement appointment will be scheduled.  Would like to have it this Friday.  Please call back.

## 2011-10-15 NOTE — Telephone Encounter (Signed)
Spoke with pt and told her appt for holter monitor has been made for September 6 at 4:00 after she has echo done.

## 2011-10-17 ENCOUNTER — Encounter (INDEPENDENT_AMBULATORY_CARE_PROVIDER_SITE_OTHER): Payer: 59

## 2011-10-17 ENCOUNTER — Ambulatory Visit (HOSPITAL_COMMUNITY): Payer: 59 | Attending: Cardiology | Admitting: Radiology

## 2011-10-17 ENCOUNTER — Other Ambulatory Visit: Payer: Self-pay

## 2011-10-17 DIAGNOSIS — I059 Rheumatic mitral valve disease, unspecified: Secondary | ICD-10-CM | POA: Insufficient documentation

## 2011-10-17 DIAGNOSIS — I341 Nonrheumatic mitral (valve) prolapse: Secondary | ICD-10-CM

## 2011-10-17 DIAGNOSIS — R002 Palpitations: Secondary | ICD-10-CM | POA: Insufficient documentation

## 2011-10-17 DIAGNOSIS — I079 Rheumatic tricuspid valve disease, unspecified: Secondary | ICD-10-CM | POA: Insufficient documentation

## 2011-10-17 DIAGNOSIS — I10 Essential (primary) hypertension: Secondary | ICD-10-CM | POA: Insufficient documentation

## 2011-10-17 NOTE — Progress Notes (Signed)
Echocardiogram performed.  

## 2011-10-24 ENCOUNTER — Telehealth: Payer: Self-pay | Admitting: Cardiovascular Disease

## 2011-10-24 NOTE — Telephone Encounter (Signed)
Pt will be at this number for and then [place] and you will not be able to reach her until Monday

## 2011-10-24 NOTE — Telephone Encounter (Signed)
Left message to call back  

## 2011-10-24 NOTE — Telephone Encounter (Signed)
See Holter. NSR with bradycardia and several short runs of SVT. She has been on propranolol. Can we let her know results, reassure her that this is not dangerous and if she would like, we can change her from propranolol to Toprol XL 25 mg po Qdaily. If she does  Not wish to switch meds, I can discuss further at f/u.  Thanks, chris

## 2011-10-24 NOTE — Telephone Encounter (Signed)
Spoke with pt and reviewed monitor results and information from Dr. Clifton James. She does not want to make medication change at this time as Inderal helps with her tremors.  She will discuss with Dr. Clifton James at office visit on Sept 17, 2013

## 2011-10-24 NOTE — Telephone Encounter (Signed)
New problem    Returning call back to nurse.   

## 2011-10-24 NOTE — Telephone Encounter (Signed)
See previous phone note dated 10/24/11

## 2011-10-28 ENCOUNTER — Encounter: Payer: Self-pay | Admitting: Cardiovascular Disease

## 2011-10-28 ENCOUNTER — Ambulatory Visit (INDEPENDENT_AMBULATORY_CARE_PROVIDER_SITE_OTHER): Payer: 59 | Admitting: Cardiovascular Disease

## 2011-10-28 ENCOUNTER — Other Ambulatory Visit: Payer: Self-pay | Admitting: Internal Medicine

## 2011-10-28 VITALS — BP 130/83 | HR 76 | Ht 69.0 in | Wt 173.0 lb

## 2011-10-28 DIAGNOSIS — I059 Rheumatic mitral valve disease, unspecified: Secondary | ICD-10-CM

## 2011-10-28 DIAGNOSIS — I340 Nonrheumatic mitral (valve) insufficiency: Secondary | ICD-10-CM

## 2011-10-28 DIAGNOSIS — R002 Palpitations: Secondary | ICD-10-CM

## 2011-10-28 DIAGNOSIS — I498 Other specified cardiac arrhythmias: Secondary | ICD-10-CM

## 2011-10-28 DIAGNOSIS — I471 Supraventricular tachycardia: Secondary | ICD-10-CM

## 2011-10-28 MED ORDER — METOPROLOL SUCCINATE ER 25 MG PO TB24: 25.0000 mg | ORAL_TABLET | Freq: Every day | ORAL | Status: DC

## 2011-10-28 NOTE — Patient Instructions (Addendum)
Your physician wants you to follow-up in:  6 months. You will receive a reminder letter in the mail two months in advance. If you don't receive a letter, please call our office to schedule the follow-up appointment.  Your physician has recommended you make the following change in your medication:Stop Inderal. Start Toprol 25 mg by mouth daily.  You have been referred to  Dr. Graciela Husbands or Dr. Johney Frame

## 2011-10-28 NOTE — Progress Notes (Signed)
History of Present Illness: 51 yo WF with history of HLD, HTN, Bipolar disorder, IBS, Mitral valve prolapse here today for cardiac follow up. She was seen as a new patient 10/08/11 to establish cardiology care. She was diagnosed with MVP in the 1980s. She had a f/u echo in 1997 which she reports showed mild MR. She tells me that she has been having sharp pains in her chest, occasionally associated with SOB. These episodes last for a few minutes. She was seen in the ED at [facility] 08/11/11 and had a coronary CT 08/12/11 which showed normal coronary arteries. Also notes skipped heart beats several times per week. This is described as a pause and feeling of SOB. Lasts a few seconds. No prolonged episodes. I ordered an echo which showed normal LV size and function, mild MR. A 48 hour Holter monitor showed NSR with bradycardia, PACs and 4 runs of SVT (longest 17 beats).   She is here for follow up and is feeling the same. She feels the palpitations every day. No chest pain or SOB. She feels dizzy when she has the palpitations. She has had no near syncope or syncope. She drinks 3 glasses of tea per day. She does not smoke.   Primary Care Physician: Danna, Palomarez  Last Lipid Profile:Lipid Panel     Component Value Date/Time   CHOL 268* 08/29/2011 1112   TRIG 286.0* 08/29/2011 1112   HDL 41.30 08/29/2011 1112   CHOLHDL 6 08/29/2011 1112   VLDL 57.2* 08/29/2011 1112   LDLCALC 110* 04/05/2010 0940     Past Medical History  Diagnosis Date  . DEPRESSION   . GERD   . HYPERLIPIDEMIA   . MYALGIA   . BIPOLAR DISORDER UNSPECIFIED   . IRRITABLE BOWEL SYNDROME, HX OF   . Hypertension   . SVT (supraventricular tachycardia)     Past Surgical History  Procedure Date  . Lipoma (r) side 1990    Right back    Current Outpatient Prescriptions  Medication Sig Dispense Refill  . buPROPion (WELLBUTRIN XL) 150 MG 24 hr tablet Take 150 mg by mouth at bedtime.       . Cholecalciferol (VITAMIN D3) 1000 UNITS CAPS Take  1,000 Units by mouth at bedtime.       . divalproex (DEPAKOTE) 500 MG 24 hr tablet Take 1,500 mg by mouth at bedtime.       Marland Kitchen esomeprazole (NEXIUM) 40 MG capsule Take 1 capsule (40 mg total) by mouth at bedtime.  90 capsule  3  . fluticasone (FLONASE) 50 MCG/ACT nasal spray Place 2 sprays into the nose daily as needed. For allergies      . Norethin Ace-Eth Estrad-FE (LOESTRIN 24 FE) 1-20 MG-MCG(24) TABS Take 1 tablet by mouth at bedtime.       . ORACEA 40 MG capsule TAKE 1 CAPSULE BY MOUTH ONCE DAILY  90 each  0  . propranolol ER (INDERAL LA) 80 MG 24 hr capsule Take 1 capsule (80 mg total) by mouth at bedtime.  30 capsule  11  . tretinoin (RETIN-A) 0.025 % cream Apply 1 application topically at bedtime. Pt uses both strengths 0.025% and 0.05%  45 g  5  . tretinoin (RETIN-A) 0.05 % cream Apply 1 application topically at bedtime. Pt uses both strengths 0.025% and 0.05%  45 g  5    No Known Allergies  History   Social History  . Marital Status: Married    Spouse Name: N/A    Number  of Children: 0  . Years of Education: N/A   Occupational History  . 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 Smoking status: Former Smoker -- 1.0 packs/day for 25 years    Types: Cigarettes    Quit date: 02/11/1996  . Smokeless tobacco: Not on file   Comment: Married, lives with spouse. 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 Alcohol Use: No  . Drug Use: No  . Sexually Active: Yes    Birth Control/ Protection: Pill   Other Topics Concern  . Not on file   Social History Narrative   Married, lives with spouse. 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 Lung cancer Mother   . Hypertension Mother   . Hyperlipidemia Mother   . Cancer Father     Esophageal cancer  . Hyperlipidemia Father   . Colon cancer Other   . Sarcoidosis Other   . Testicular cancer Other    Echo 10/17/11: Left ventricle: The cavity size was normal.  There was mild focal basal hypertrophy of the septum. Systolic function was normal. The estimated ejection fraction was in the range of 55% to 60%. Wall motion was normal; there were no regional wall motion abnormalities. Features are consistent with a pseudonormal left ventricular filling pattern, with concomitant abnormal relaxation and increased filling pressure (grade 2 diastolic dysfunction). - Mitral valve: Mild regurgitation.  Review of Systems:  As stated in the HPI and otherwise negative.   BP 130/83  Pulse 76  Ht 5\' 9"  (1.753 m)  Wt 173 lb (78.472 kg)  BMI 25.55 kg/m2  Physical Examination: General: Well developed, well nourished, NAD HEENT: OP clear, mucus membranes moist SKIN: warm, dry. No rashes. Neuro: No focal deficits Musculoskeletal: Muscle strength 5/5 all ext Psychiatric: Mood and affect normal Neck: No JVD, no carotid bruits, no thyromegaly, no lymphadenopathy. Lungs:Clear bilaterally, no wheezes, rhonci, crackles Cardiovascular: Regular rate and rhythm. No murmurs, gallops or rubs. Abdomen:Soft. Bowel sounds present. Non-tender.  Extremities: No lower extremity edema. Pulses are 2 + in the bilateral DP/PT.  Echo: 10/17/11:  Left ventricle: The cavity size was normal. There was mild focal basal hypertrophy of the septum. Systolic function was normal. The estimated ejection fraction was in the range of 55% to 60%. Wall motion was normal; there were no regional wall motion abnormalities. Features are consistent with a pseudonormal left ventricular filling pattern, with concomitant abnormal relaxation and increased filling pressure (grade 2 diastolic dysfunction). - Mitral valve: Mild regurgitation.   Assessment and Plan:   1. SVT: She has PACs with several runs of SVT. She has been taking propranolol for over a year. She does have bradycardic episodes at night. TSH normal in April 2013. She is willing to try changing to a different beta blocker  although she likes her propranolol since it seems to help with anxiety. I will stop propranolol and start Toprol XL 25 mg po Qdaily. Given her bradycardia, I am not sure that I will be able to control her SVT with medical therapy alone. I will have her wear a 21 day monitor and will arrange f/u in EP clinic for further evaluation and discussion regarding medical options as well as possible SVT ablation.   2. Mitral regurgitation: mild by echo September 2013.

## 2011-10-29 ENCOUNTER — Telehealth: Payer: Self-pay | Admitting: *Deleted

## 2011-10-29 NOTE — Telephone Encounter (Addendum)
A message was left for patient to call and schedule 21 day event monitor and EP consults with Allred/Klein after.

## 2011-11-03 ENCOUNTER — Ambulatory Visit (INDEPENDENT_AMBULATORY_CARE_PROVIDER_SITE_OTHER): Payer: 59 | Admitting: Licensed Clinical Social Worker

## 2011-11-03 DIAGNOSIS — F3189 Other bipolar disorder: Secondary | ICD-10-CM

## 2011-11-04 ENCOUNTER — Encounter (INDEPENDENT_AMBULATORY_CARE_PROVIDER_SITE_OTHER): Payer: 59

## 2011-11-04 DIAGNOSIS — R002 Palpitations: Secondary | ICD-10-CM

## 2011-11-04 DIAGNOSIS — R000 Tachycardia, unspecified: Secondary | ICD-10-CM

## 2011-11-11 ENCOUNTER — Ambulatory Visit (INDEPENDENT_AMBULATORY_CARE_PROVIDER_SITE_OTHER): Payer: 59 | Admitting: Licensed Clinical Social Worker

## 2011-11-11 ENCOUNTER — Telehealth: Payer: Self-pay | Admitting: *Deleted

## 2011-11-11 DIAGNOSIS — F3189 Other bipolar disorder: Secondary | ICD-10-CM

## 2011-11-11 NOTE — Telephone Encounter (Signed)
Received pt's cardiac monitor results from ruth and reviewed with dr nishan(DOD)--per dr nishan--pt needs f/u appoint with dr Clifton James to review results of monitor---i tried to call pt and LM on answering machine for pt to return my call--pt has an appoint with dr allred on 12/04/11

## 2011-11-13 ENCOUNTER — Telehealth: Payer: Self-pay | Admitting: *Deleted

## 2011-11-13 DIAGNOSIS — R002 Palpitations: Secondary | ICD-10-CM

## 2011-11-13 MED ORDER — METOPROLOL SUCCINATE ER 50 MG PO TB24: 50.0000 mg | ORAL_TABLET | Freq: Every day | ORAL | Status: DC

## 2011-11-13 NOTE — Telephone Encounter (Signed)
Spoke with pt and gave her these instructions. She states she is aware of palpitations but has had them for years and they are no worse than they have been.  She will let us know if she has symptoms.

## 2011-11-13 NOTE — Telephone Encounter (Signed)
Monitor reviewed with Dr. Johney Frame and instructions given for pt to increase Toprol to 50 mg daily. Keep scheduled appt with Dr. Johney Frame on December 03, 2011.  Also reviewed with Dr. Clifton James who agrees with this plan.  Does not need appt with Dr. Clifton James prior to seeing Dr.Allred.  I called pt to give her this information and left message to call back

## 2011-11-21 ENCOUNTER — Telehealth: Payer: Self-pay | Admitting: Internal Medicine

## 2011-11-21 ENCOUNTER — Telehealth: Payer: Self-pay | Admitting: Cardiovascular Disease

## 2011-11-21 NOTE — Telephone Encounter (Signed)
Patient  To discontinue lifewatch 3 wk monitor due to allergic reaction, she can be reached at [phone]

## 2011-11-21 NOTE — Telephone Encounter (Signed)
No answer on home number listed below as call back number. Message left on cell phone to call back

## 2011-11-21 NOTE — Telephone Encounter (Signed)
Spoke with pt who reports electrodes are causing her to have rash.  She has been wearing hypo allergenic electrodes.  Has worn monitor for 18 days. I told her she could take off monitor and return and that I would let Dr. Clifton James know why she stopped wearing it early.

## 2011-11-21 NOTE — Telephone Encounter (Signed)
Pt calling re having to discontinue life watch monitor due to an allergic reaction, pls advise, message was re written due to first message being sent to wrong dr , pt [phone]

## 2011-11-21 NOTE — Telephone Encounter (Signed)
Agree. cdm 

## 2011-11-21 NOTE — Telephone Encounter (Signed)
Error message wrong dr

## 2011-12-03 ENCOUNTER — Encounter: Payer: Self-pay | Admitting: Internal Medicine

## 2011-12-03 ENCOUNTER — Ambulatory Visit (INDEPENDENT_AMBULATORY_CARE_PROVIDER_SITE_OTHER): Payer: 59 | Admitting: Internal Medicine

## 2011-12-03 VITALS — BP 138/85 | HR 76 | Ht 69.0 in | Wt 174.0 lb

## 2011-12-03 DIAGNOSIS — I471 Supraventricular tachycardia, unspecified: Secondary | ICD-10-CM | POA: Insufficient documentation

## 2011-12-03 DIAGNOSIS — I10 Essential (primary) hypertension: Secondary | ICD-10-CM

## 2011-12-03 DIAGNOSIS — I498 Other specified cardiac arrhythmias: Secondary | ICD-10-CM

## 2011-12-03 NOTE — Assessment & Plan Note (Signed)
The patient presents today for EP consultation.  She has been documented to have a long RP tachycardia which I think is most likely an atrial tachycardia.  She also has short afib documented.  It is possible that her tachycardia arises from a pulmonary vein source though I cannot exclude other SVT.  She takes metoprolol with some improvement.  Episodes are short lived.  Presently, I am reluctant to adjust her medicines. Lifestyle modification including stress reduction, regular exercise, and avoidance of caffeine are advised.  I have also encouraged her to follow-up with a sleep study as recommended by neurology.  I think that if her episodes do not improve or certainly if they increase then initiation of flecainide for arrhythmic control would be reasonable. I would reserve catheter ablation until she has failed medical therapy as the likely of being able to induced sustained arrhythmia amenable to mapping and ablation is probably reduced given its nonsustained nature thus far. I will see her again in 4-6 weeks for follow-up.

## 2011-12-03 NOTE — Progress Notes (Signed)
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 female with a h/o recently diagnosed SVT who presents today for EP consultation.  She presents today for further evaluation of palpitations.  She presented to [facility] ER 08/11/11 with atypical chest pain and had a coronary CT 08/12/11 which showed normal coronary arteries.  She reports symptoms of palpitations for several months.  She describes this as abrupt onset/ offset of tachypalpitations.  Episodes last less than 30 seconds typically.  Episodes occur at rest.  She denies exertional symptoms.  She finds that they occur several times per day.  She reports mild associated SOB and brief dizziness.  She feels that episodes are worsened by anxiety.  She was evaluated by Dr Clifton  and had a 48 hour Holter monitor placed which recorded NSR with PACs and nonsustained SVT.  She had an vent monitor placed which revealed pacs as well as similar episodes of SVT.  Episodes appear to be long RP and irregular.  She also has short episodes of afib noted.  Today, she denies symptoms of chest pain,orthopnea, PND, lower extremity edema, presyncope, syncope, or neurologic sequela.  She has significant anxiety.  She is also frequently tired.  She does not feel well rested in the am and reports that she has been referred for sleep study by a neurologist previously but did not follow this advice.  The patient is tolerating medications without difficulties and is otherwise without complaint today.   Past Medical History  Diagnosis Date  . DEPRESSION   . GERD   . HYPERLIPIDEMIA   . MYALGIA   . BIPOLAR DISORDER UNSPECIFIED   . IRRITABLE BOWEL SYNDROME, HX OF   . Hypertension   . Atrial tachycardia   . Hypertension    Past Surgical History  Procedure Date  . Lipoma (r) side 1990    Right back    Current Outpatient Prescriptions  Medication Sig Dispense Refill  . buPROPion (WELLBUTRIN XL) 150 MG 24 hr  tablet Take 150 mg by mouth at bedtime.       . Cholecalciferol (VITAMIN D3) 1000 UNITS CAPS Take 1,000 Units by mouth at bedtime.       . divalproex (DEPAKOTE) 500 MG 24 hr tablet Take 1,500 mg by mouth at bedtime.       Marland Kitchen esomeprazole (NEXIUM) 40 MG capsule Take 1 capsule (40 mg total) by mouth at bedtime.  90 capsule  3  . fluticasone (FLONASE) 50 MCG/ACT nasal spray Place 2 sprays into the nose daily as needed. For allergies      . metoprolol succinate (TOPROL XL) 50 MG 24 hr tablet Take 1 tablet (50 mg total) by mouth daily.  30 tablet  11  . Norethin Ace-Eth Estrad-FE (LOESTRIN 24 FE) 1-20 MG-MCG(24) TABS Take 1 tablet by mouth at bedtime.       . ORACEA 40 MG capsule TAKE 1 CAPSULE BY MOUTH ONCE DAILY  90 capsule  3  . tretinoin (RETIN-A) 0.025 % cream Apply 1 application topically at bedtime. Pt uses both strengths 0.025% and 0.05%  45 g  5  . DISCONTD: tretinoin (RETIN-A) 0.05 % cream Apply 1 application topically at bedtime. Pt uses both strengths 0.025% and 0.05%  45 g  5    No Known Allergies  History   Social History  . Marital Status: Married    Spouse Name: N/A    Number of Children: 0  .  Years of Education: N/A   Occupational History  . 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 Smoking status: Former Smoker -- 1.0 packs/day for 25 years    Types: Cigarettes    Quit date: 02/11/1996  . Smokeless tobacco: Not on file   Comment: Married, lives with spouse. 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 Alcohol Use: No  . Drug Use: No  . Sexually Active: Yes    Birth Control/ Protection: Pill   Other Topics Concern  . Not on file   Social History Narrative   Married, lives with spouse in [place]. 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 Lung cancer Mother   . Hypertension Mother   . Hyperlipidemia Mother   . Cancer Father     Esophageal cancer  . Hyperlipidemia Father   . Colon cancer  Other   . Sarcoidosis Other   . Testicular cancer Other     ROS- All systems are reviewed and negative except as per the HPI above  Physical Exam: Filed Vitals:   12/03/11 1008  BP: 138/85  Pulse: 76  Height: 5\' 9"  (1.753 m)  Weight: 174 lb (78.926 kg)    GEN- The patient is anxious appearing, alert and oriented x 3 today.   Head- normocephalic, atraumatic Eyes-  Sclera clear, conjunctiva pink Ears- hearing intact Oropharynx- clear Neck- supple,  Lungs- Clear to ausculation bilaterally, normal work of breathing Heart- Regular rate and rhythm, no murmurs, rubs or gallops, PMI not laterally displaced GI- soft, NT, ND, + BS Extremities- no clubbing, cyanosis, or edema MS- no significant deformity or atrophy Skin- no rash or lesion Psych- euthymic mood, full affect Neuro- strength and sensation are intact  EKG 08/13/11 reveals sinus rhythm, normal ekg Echo and cardiac CT are reviewed Holter, event monitor and Dr Gibson Ramp notes are reviewed  Assessment and Plan:

## 2011-12-03 NOTE — Assessment & Plan Note (Signed)
Stable No change required today  

## 2011-12-03 NOTE — Patient Instructions (Signed)
Your physician recommends that you schedule a follow-up appointment in: 6 weeks with Dr Allred  

## 2011-12-18 ENCOUNTER — Other Ambulatory Visit: Payer: Self-pay | Admitting: Internal Medicine

## 2011-12-18 DIAGNOSIS — R002 Palpitations: Secondary | ICD-10-CM

## 2011-12-18 MED ORDER — METOPROLOL SUCCINATE ER 50 MG PO TB24: 50.0000 mg | ORAL_TABLET | Freq: Every day | ORAL | Status: DC

## 2012-01-11 DIAGNOSIS — N951 Menopausal and female climacteric states: Secondary | ICD-10-CM

## 2012-01-11 HISTORY — DX: Menopausal and female climacteric states: N95.1

## 2012-01-14 ENCOUNTER — Ambulatory Visit: Payer: 59 | Admitting: Internal Medicine

## 2012-02-11 DIAGNOSIS — E041 Nontoxic single thyroid nodule: Secondary | ICD-10-CM

## 2012-02-11 HISTORY — DX: Nontoxic single thyroid nodule: E04.1

## 2012-03-05 ENCOUNTER — Encounter: Payer: Self-pay | Admitting: Family Medicine

## 2012-03-05 ENCOUNTER — Ambulatory Visit (INDEPENDENT_AMBULATORY_CARE_PROVIDER_SITE_OTHER): Payer: 59 | Admitting: Family Medicine

## 2012-03-05 VITALS — BP 136/84 | HR 76 | Temp 97.9°F | Wt 180.2 lb

## 2012-03-05 DIAGNOSIS — R5381 Other malaise: Secondary | ICD-10-CM

## 2012-03-05 DIAGNOSIS — R5383 Other fatigue: Secondary | ICD-10-CM

## 2012-03-05 DIAGNOSIS — E785 Hyperlipidemia, unspecified: Secondary | ICD-10-CM

## 2012-03-05 LAB — BASIC METABOLIC PANEL
BUN: 8 mg/dL (ref 6–23)
CO2: 27 mEq/L (ref 19–32)
Calcium: 9.1 mg/dL (ref 8.4–10.5)
Chloride: 105 mEq/L (ref 96–112)
Creatinine, Ser: 0.9 mg/dL (ref 0.4–1.2)
GFR: 74.57 mL/min (ref >60.00)
Glucose, Bld: 85 mg/dL (ref 70–99)
Potassium: 3.6 mEq/L (ref 3.5–5.1)
Sodium: 140 mEq/L (ref 135–145)

## 2012-03-05 LAB — CBC WITH DIFFERENTIAL/PLATELET
Basophils Absolute: 0 10*3/uL (ref 0.0–0.1)
Basophils Relative: 0.4 % (ref 0.0–3.0)
Eosinophils Absolute: 0.2 10*3/uL (ref 0.0–0.7)
Eosinophils Relative: 2.9 % (ref 0.0–5.0)
HCT: 40.9 % (ref 36.0–46.0)
Hemoglobin: 13.8 g/dL (ref 12.0–15.0)
Lymphocytes Relative: 40.1 % (ref 12.0–46.0)
Lymphs Abs: 2.4 10*3/uL (ref 0.7–4.0)
MCHC: 33.7 g/dL (ref 30.0–36.0)
MCV: 88.9 fl (ref 78.0–100.0)
Monocytes Absolute: 0.5 10*3/uL (ref 0.1–1.0)
Monocytes Relative: 7.7 % (ref 3.0–12.0)
Neutro Abs: 2.9 10*3/uL (ref 1.4–7.7)
Neutrophils Relative %: 48.9 % (ref 43.0–77.0)
Platelets: 201 10*3/uL (ref 150.0–400.0)
RBC: 4.6 Mil/uL (ref 3.87–5.11)
RDW: 13.8 % (ref 11.5–14.6)
WBC: 5.9 10*3/uL (ref 4.5–10.5)

## 2012-03-05 LAB — TSH: TSH: 2.94 u[IU]/mL (ref 0.35–5.50)

## 2012-03-05 LAB — LDL CHOLESTEROL, DIRECT: Direct LDL: 179.2 mg/dL

## 2012-03-05 LAB — T3, FREE: T3, Free: 2.9 pg/mL (ref 2.3–4.2)

## 2012-03-05 LAB — T4, FREE: Free T4: 0.77 ng/dL (ref 0.60–1.60)

## 2012-03-05 LAB — LIPID PANEL
Cholesterol: 248 mg/dL — ABNORMAL HIGH (ref 0–200)
HDL: 35.5 mg/dL — ABNORMAL LOW (ref >39.00)
Total CHOL/HDL Ratio: 7
Triglycerides: 192 mg/dL — ABNORMAL HIGH (ref 0.0–149.0)
VLDL: 38.4 mg/dL (ref 0.0–40.0)

## 2012-03-05 NOTE — Progress Notes (Signed)
  Subjective:     Beverly Cole is a 52 y.o. female who presents for evaluation of fatigue. Symptoms began several months ago. The patient feels the fatigue began with: new meds. Symptoms of her fatigue have been general malaise. Patient describes the following psychological symptoms: none. Patient denies constipation, excessive menstrual bleeding, fever, GI blood loss, significant change in weight, symptoms of arthritis, unusual rashes and witnessed or suspected sleep apnea. Symptoms have stabilized. Symptom severity: symptoms bothersome, but easily able to carry out all usual work/school/family activities. Previous visits for this problem: none.   Pt used to be tired because she   The following portions of the patient's history were reviewed and updated as appropriate: allergies, current medications, past family history, past medical history, past social history, past surgical history and problem list.  Review of Systems Pertinent items are noted in HPI.    Objective:    BP 136/84  Pulse 76  Temp 97.9 F (36.6 C) (Oral)  Wt 180 lb 3.2 oz (81.738 kg)  SpO2 97% General appearance: alert, cooperative, appears stated age and no distress Neck: no adenopathy, supple, symmetrical, trachea midline and thyroid not enlarged, symmetric, no tenderness/mass/nodules Lungs: clear to auscultation bilaterally Heart: S1, S2 normal Extremities: extremities normal, atraumatic, no cyanosis or edema    Assessment:    Fatigue, organic etiology unlikely based on careful history and exam.    Plan:    Discussed diagnosis with patient. See orders for lab evaluation. f/u prn-- pt to discuss meds with cardio also

## 2012-03-05 NOTE — Patient Instructions (Addendum)

## 2012-03-08 ENCOUNTER — Encounter: Payer: Self-pay | Admitting: Internal Medicine

## 2012-03-08 ENCOUNTER — Ambulatory Visit (INDEPENDENT_AMBULATORY_CARE_PROVIDER_SITE_OTHER): Payer: 59 | Admitting: Internal Medicine

## 2012-03-08 VITALS — BP 128/86 | HR 78 | Ht 69.0 in | Wt 176.2 lb

## 2012-03-08 DIAGNOSIS — I471 Supraventricular tachycardia: Secondary | ICD-10-CM

## 2012-03-08 DIAGNOSIS — I498 Other specified cardiac arrhythmias: Secondary | ICD-10-CM

## 2012-03-08 NOTE — Patient Instructions (Addendum)
Your physician wants you to follow-up in: Dr Clifton James in 6 months  You will receive a reminder letter in the mail two months in advance. If you don't receive a letter, please call our office to schedule the follow-up appointment.

## 2012-03-14 NOTE — Progress Notes (Signed)
PCP: Beverly Freud, DO Primary Cardiologist:  Dr Gustavus Messing is a 52 y.o. female who presents today for routine electrophysiology followup.  Since last being seen in our clinic, the patient reports doing very well.   She has had no symptoms of arrhythmia.  She is pleased with her current health state.  Today, she denies symptoms of palpitations, chest pain, shortness of breath,  lower extremity edema, dizziness, presyncope, or syncope.  The patient is otherwise without complaint today.   Past Medical History  Diagnosis Date  . DEPRESSION   . GERD   . HYPERLIPIDEMIA   . MYALGIA   . BIPOLAR DISORDER UNSPECIFIED   . IRRITABLE BOWEL SYNDROME, HX OF   . Hypertension   . Atrial tachycardia   . Hypertension    Past Surgical History  Procedure Date  . Lipoma (r) side 1990    Right back    Current Outpatient Prescriptions  Medication Sig Dispense Refill  . buPROPion (WELLBUTRIN XL) 150 MG 24 hr tablet Take 150 mg by mouth at bedtime.       . Cholecalciferol (VITAMIN D3) 1000 UNITS CAPS Take 1,000 Units by mouth at bedtime.       . divalproex (DEPAKOTE) 500 MG 24 hr tablet Take 1,500 mg by mouth at bedtime.       Marland Kitchen esomeprazole (NEXIUM) 40 MG capsule Take 1 capsule (40 mg total) by mouth at bedtime.  90 capsule  3  . fluticasone (FLONASE) 50 MCG/ACT nasal spray Place 2 sprays into the nose daily as needed. For allergies      . metoprolol succinate (TOPROL-XL) 50 MG 24 hr tablet Take 1 tablet (50 mg total) by mouth daily.  30 tablet  11  . norethindrone (MICRONOR,CAMILA,ERRIN) 0.35 MG tablet Take 1 tablet by mouth daily.      . ORACEA 40 MG capsule TAKE 1 CAPSULE BY MOUTH ONCE DAILY  90 capsule  3  . tretinoin (RETIN-A) 0.025 % cream Apply 1 application topically at bedtime. Pt uses both strengths 0.025% and 0.05%  45 g  5    Physical Exam: Filed Vitals:   03/08/12 1231  BP: 128/86  Pulse: 78  Height: 5\' 9"  (1.753 m)  Weight: 176 lb 3.2 oz (79.924 kg)    GEN- The patient  is well appearing, alert and oriented x 3 today.   Head- normocephalic, atraumatic Eyes-  Sclera clear, conjunctiva pink Ears- hearing intact Oropharynx- clear Lungs- Clear to ausculation bilaterally, normal work of breathing Heart- Regular rate and rhythm, no murmurs, rubs or gallops, PMI not laterally displaced GI- soft, NT, ND, + BS Extremities- no clubbing, cyanosis, or edema  ekg today reveals sinus rhythm, nonspecific ST/T changes  Assessment and Plan:

## 2012-03-14 NOTE — Assessment & Plan Note (Signed)
She has been documented to have a long RP tachycardia which I think is most likely an atrial tachycardia.  She also has short afib documented.   At this time, she feels that her symptoms are well controlled.  She does not wish to make any changes.  I think that if her episodes increase then initiation of flecainide for arrhythmic control would be reasonable.  I would reserve catheter ablation until she has failed medical therapy as the likely of being able to induced sustained arrhythmia amenable to mapping and ablation is probably reduced given its nonsustained nature thus far.   I have offered EP follow-up however at this time, she would prefer to follow with Dr Clifton .  I am happy to see her as needed going forward.

## 2012-03-23 ENCOUNTER — Ambulatory Visit (INDEPENDENT_AMBULATORY_CARE_PROVIDER_SITE_OTHER): Payer: 59 | Admitting: *Deleted

## 2012-03-23 ENCOUNTER — Encounter: Payer: Self-pay | Admitting: *Deleted

## 2012-03-23 DIAGNOSIS — Z0000 Encounter for general adult medical examination without abnormal findings: Secondary | ICD-10-CM

## 2012-04-15 ENCOUNTER — Encounter: Payer: Self-pay | Admitting: Nurse Practitioner

## 2012-04-20 ENCOUNTER — Encounter: Payer: Self-pay | Admitting: Nurse Practitioner

## 2012-05-07 ENCOUNTER — Encounter: Payer: Self-pay | Admitting: Family Medicine

## 2012-05-07 ENCOUNTER — Ambulatory Visit (INDEPENDENT_AMBULATORY_CARE_PROVIDER_SITE_OTHER): Payer: 59 | Admitting: Family Medicine

## 2012-05-07 VITALS — BP 130/82 | HR 74 | Temp 98.0°F | Wt 174.0 lb

## 2012-05-07 DIAGNOSIS — F319 Bipolar disorder, unspecified: Secondary | ICD-10-CM

## 2012-05-07 DIAGNOSIS — L259 Unspecified contact dermatitis, unspecified cause: Secondary | ICD-10-CM

## 2012-05-07 DIAGNOSIS — R251 Tremor, unspecified: Secondary | ICD-10-CM

## 2012-05-07 DIAGNOSIS — R259 Unspecified abnormal involuntary movements: Secondary | ICD-10-CM

## 2012-05-07 DIAGNOSIS — L309 Dermatitis, unspecified: Secondary | ICD-10-CM

## 2012-05-07 DIAGNOSIS — F3189 Other bipolar disorder: Secondary | ICD-10-CM

## 2012-05-07 DIAGNOSIS — F3181 Bipolar II disorder: Secondary | ICD-10-CM

## 2012-05-07 MED ORDER — FLUOCINOLONE ACETONIDE 0.01 % EX SHAM: MEDICATED_SHAMPOO | CUTANEOUS | Status: DC

## 2012-05-07 NOTE — Patient Instructions (Signed)

## 2012-05-08 LAB — VALPROIC ACID LEVEL: Valproic Acid Lvl: 112.5 ug/mL — ABNORMAL HIGH (ref 50.0–100.0)

## 2012-05-09 DIAGNOSIS — R251 Tremor, unspecified: Secondary | ICD-10-CM | POA: Insufficient documentation

## 2012-05-09 NOTE — Progress Notes (Signed)
  Subjective:    Patient ID: Beverly Cole, female    DOB: 13-Apr-1960, 52 y.o.   MRN: [id number]  HPI Pt here to have depakote level checked.   She was put on primidone which can elevated depakote levels so she stopped primidone but she wants a new neurologist. She is also requesting a new derm for skin check.  No other complaints.      Review of Systems    as above Objective:   Physical Exam  BP 130/82  Pulse 74  Temp(Src) 98 F (36.7 C) (Oral)  Wt 174 lb (78.926 kg)  BMI 25.68 kg/m2  SpO2 96% General appearance: alert, cooperative, appears stated age and no distress Neck: no adenopathy, supple, symmetrical, trachea midline and thyroid not enlarged, symmetric, no tenderness/mass/nodules Lungs: clear to auscultation bilaterally Heart: S1, S2 normal Extremities: extremities normal, atraumatic, no cyanosis or edema Neurologic: Motor: intention tremor      Assessment & Plan:

## 2012-05-09 NOTE — Assessment & Plan Note (Signed)
Pt requesting new referral to neuro Dx with Benign essential tremor by previous neuro

## 2012-05-09 NOTE — Assessment & Plan Note (Signed)
Per psych Check depakote level

## 2012-05-12 ENCOUNTER — Telehealth: Payer: Self-pay

## 2012-05-12 NOTE — Telephone Encounter (Signed)
Patient has been made aware and will call back to schedule a lab apt.     KP   Notes Recorded by Arnette Norris, CMA on 05/12/2012 at 3:07 PM msg left on machine @[phone] [facility]) ------  Notes Recorded by Lelon Perla, DO on 05/08/2012 at 4:38 PM Value is slightly high---- Now that she is back on norm meds -----recheck next week

## 2012-05-13 ENCOUNTER — Ambulatory Visit (INDEPENDENT_AMBULATORY_CARE_PROVIDER_SITE_OTHER): Payer: 59 | Admitting: Neurology

## 2012-05-13 ENCOUNTER — Encounter: Payer: Self-pay | Admitting: Neurology

## 2012-05-13 VITALS — BP 124/80 | HR 80 | Temp 97.4°F | Resp 18 | Wt 179.0 lb

## 2012-05-13 DIAGNOSIS — R443 Hallucinations, unspecified: Secondary | ICD-10-CM

## 2012-05-13 DIAGNOSIS — G250 Essential tremor: Secondary | ICD-10-CM

## 2012-05-13 DIAGNOSIS — R442 Other hallucinations: Secondary | ICD-10-CM

## 2012-05-13 DIAGNOSIS — G251 Drug-induced tremor: Secondary | ICD-10-CM

## 2012-05-13 MED ORDER — CLONAZEPAM 0.5 MG PO TABS: ORAL_TABLET | ORAL | Status: DC

## 2012-05-13 NOTE — Patient Instructions (Addendum)
1.  Try klonopin - 0.5 mg - 1/2 tablet at night for a week.  If still having symptoms, may increase to one tablet at night.  If that helps the nighttime symptoms but you still have the daytime tremor, you may try 1/2 tablet of the klonopin in the day and 1 tablet at night 2.  Continue with the depakote 3.  Call me in few weeks and just let me know how you are doing (even if doing well, I would like to know) 4.  Follow up in 3-4 months

## 2012-05-13 NOTE — Progress Notes (Signed)
Subjective:   Beverly Cole was seen in consultation in the movement disorder clinic at the request of Sophiamarie Nease, DO.  The evaluation is for tremor.  She has previously seen Dr. Lesia Sago for this.  The patient is a 52 y.o. right handed female with a history of tremor.  She states that she was dx ET and told that she did not need medication.  When she developed high blood pressure, she went on inderal and that helped tremor and BP.  She was later admitted to the hosp for w/u of CP and when she f/u with Dr. Anne Hahn, it was recommended that she have a sleep study.  Dr. Vickey Huger recommended that the patient try primidone.  That increased the patients tremor and she felt that she was sleeping all of the time.  She went off of the primidone, then remembered that VPA caused tremor so she went for a VPA level and it was 112.  She has not changed VPA dosing but relates that she is to have a repeat level in a week. Tremor is worse in the L than the right.  The patient states that the tremor is embarrassing, so she told people she had parkinsons disease to avoid that embarrassment.  Tremor is most bothersome after she eats or if she gets stressed out about something.    She also relates some tremor of the head, most noticeable when she wakes up and feels it in the bed.  It goes away when she gets up.  She relates her mother had similar tremor.    She relates visual hallucinations.  She states that the first happened when asleep.  It was just prior to awakening.  She was swatting at birds in the night and her husband woke her up and asked her what she was doing because she was flinging her pillow around.  She has had several of these other episodes.  All of these arise out of sleep and are never during the day.    Current/Previously tried tremor medications: primidone (increased tremor d/t inc VPA levels)  Current medications that may exacerbate tremor:  VPA  Outside reports reviewed: historical medical  records.  No Known Allergies  Current Outpatient Prescriptions on File Prior to Visit  Medication Sig Dispense Refill  . buPROPion (WELLBUTRIN XL) 150 MG 24 hr tablet Take 150 mg by mouth at bedtime.       . Cholecalciferol (VITAMIN D3) 1000 UNITS CAPS Take 1,000 Units by mouth at bedtime.       . divalproex (DEPAKOTE) 500 MG 24 hr tablet Take 1,500 mg by mouth at bedtime.       Marland Kitchen esomeprazole (NEXIUM) 40 MG capsule Take 1 capsule (40 mg total) by mouth at bedtime.  90 capsule  3  . Fluocinolone Acetonide 0.01 % SHAM Massage into scalp and leave in 5 min then rinse.   May use 3x a week  120 mL  1  . fluticasone (FLONASE) 50 MCG/ACT nasal spray Place 2 sprays into the nose daily as needed. For allergies      . lidocaine (XYLOCAINE) 2 % jelly Apply topically as needed (for dysparunia).      . metoprolol succinate (TOPROL-XL) 50 MG 24 hr tablet Take 1 tablet (50 mg total) by mouth daily.  30 tablet  11  . nitrofurantoin, macrocrystal-monohydrate, (MACROBID) 100 MG capsule Take 100 mg by mouth once as needed (post coital).      . norethindrone (MICRONOR,CAMILA,ERRIN) 0.35 MG tablet  Take 1 tablet by mouth daily.      . ORACEA 40 MG capsule TAKE 1 CAPSULE BY MOUTH ONCE DAILY  90 capsule  3  . tretinoin (RETIN-A) 0.025 % cream Apply 1 application topically at bedtime. Pt uses both strengths 0.025% and 0.05%  45 g  5   No current facility-administered medications on file prior to visit.    Past Medical History  Diagnosis Date  . DEPRESSION   . GERD   . HYPERLIPIDEMIA   . MYALGIA   . BIPOLAR DISORDER UNSPECIFIED   . IRRITABLE BOWEL SYNDROME, HX OF   . Hypertension   . Atrial tachycardia   . Hypertension   . Headache     usually with cycle  . Bursitis of hip 09/1999    Bilateral - Dr. Luiz Blare  . Shortness of breath     Pulmonary eval 05/2002  . Mitral valve prolapse 12/17/2000    cardiolyte study 12/2000 neg  . Fistula of female genitalia 03/29/2007    enterovesical fistula, not reapired   . Anxiety   . Menopausal state 01/2012    [facility] = 88.5    Past Surgical History  Procedure Laterality Date  . Lipoma (r) side  1990    Right back    History   Social History  . Marital Status: Married    Spouse Name: N/A    Number of Children: 0  . Years of Education: N/A   Occupational History  . 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 Smoking status: Former Smoker -- 1.00 packs/day for 25 years    Types: Cigarettes    Quit date: 02/11/1996  . Smokeless tobacco: Never Used     Comment: Married, lives with spouse. 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 Alcohol Use: No  . Drug Use: No  . Sexually Active: Yes    Birth Control/ Protection: Pill   Other Topics Concern  . Not on file   Social History Narrative   Married, lives with spouse in [place]. 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 Mother Deceased     lung CA  . Father Deceased     esophageal CA  . Brother Alive     testicular CA    Review of Systems A complete 10 system ROS was obtained and was negative apart from what is mentioned.   Objective:   VITALS:   Filed Vitals:   05/13/12 1259  BP: 124/80  Pulse: 80  Temp: 97.4 F (36.3 C)  Resp: 18  Weight: 179 lb (81.194 kg)   Gen:  Appears stated age and in NAD. HEENT:  Normocephalic, atraumatic. The mucous membranes are moist. The superficial temporal arteries are without ropiness or tenderness. Cardiovascular: Regular rate and rhythm. Lungs: Clear to auscultation bilaterally. Neck: There are no carotid bruits noted bilaterally.  NEUROLOGICAL:  Orientation:  The patient is alert and oriented x 3.  Recent and remote memory are intact.  Attention span and concentration are normal.  Able to name objects and repeat without trouble.  Fund of knowledge is appropriate Cranial nerves: There is good facial symmetry. The pupils are equal round and reactive to light bilaterally.  Fundoscopic exam reveals clear disc margins bilaterally. Extraocular muscles are intact and visual fields are full to confrontational testing. Speech is fluent and clear. Soft palate rises symmetrically and there is no tongue deviation. Hearing  is intact to conversational tone. Tone: Tone is good throughout. Sensation: Sensation is intact to light touch and pinprick throughout (facial, trunk, extremities). Vibration is intact at the bilateral big toe. There is no extinction with double simultaneous stimulation. There is no sensory dermatomal level identified. Coordination:  The patient has no dysdiadichokinesia or dysmetria. Motor: Strength is 5/5 in the bilateral upper and lower extremities.  Shoulder shrug is equal bilaterally.  There is no pronator drift.  There are no fasciculations noted. DTR's: Deep tendon reflexes are 2/4 at the bilateral biceps, triceps, brachioradialis, patella and achilles.  Plantar responses are downgoing bilaterally. Gait and Station: The patient is able to ambulate without difficulty. The patient is able to heel toe walk without any difficulty. The patient is able to ambulate in a tandem fashion. The patient is able to stand in the Romberg position.   MOVEMENT EXAM: Tremor:  There is a fine tremor of the outstretched hands that does not particularly increase with intention.  It is primarily postural.  There is a separate, distractible tremor that is nonphysiologic in nature that is in the left upper extremity.       Assessment/Plan:   1.  Tremor.  -I think that this is multifactorial in nature.  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 I told the patient that she just needed to be aware that Depakote causes tremor, and that anything that raises Depakote levels will also increase tremor.  This is likely the reason that she get worse on the primidone as it will increase Depakote levels.  -I do think that stress could play a role  as well, as there was a psychogenic aspect to certain portions of the tremor with a distractiblity component. 2.  Hypnopompic Hallucinosis.  -This is a relatively normal phenomenon, but because she is acting out her dreams in the midst of this creating an REM behavior disorder, then it can be scary and increasing problems.  She would like to try clonazepam.  She has used this in the past for other things and has tolerated the medication well.  It may help the tremor as well.  I gave her a titration schedule.  I want her to try to use the lowest dose possible.  -Risks, benefits, side effects and alternative therapies were discussed.  The opportunity to ask questions was given and they were answered to the best of my ability.  The patient expressed understanding and willingness to follow the outlined treatment protocols. 3.  Return in about 15 weeks (around 08/26/2012).

## 2012-05-18 ENCOUNTER — Encounter: Payer: Self-pay | Admitting: Nurse Practitioner

## 2012-05-18 ENCOUNTER — Ambulatory Visit (INDEPENDENT_AMBULATORY_CARE_PROVIDER_SITE_OTHER): Payer: 59 | Admitting: Nurse Practitioner

## 2012-05-18 VITALS — BP 122/70 | HR 68 | Resp 16 | Ht 68.5 in | Wt 176.0 lb

## 2012-05-18 DIAGNOSIS — Z01419 Encounter for gynecological examination (general) (routine) without abnormal findings: Secondary | ICD-10-CM

## 2012-05-18 DIAGNOSIS — N952 Postmenopausal atrophic vaginitis: Secondary | ICD-10-CM

## 2012-05-18 MED ORDER — LIDOCAINE HCL 2 % EX GEL: CUTANEOUS | Status: DC | PRN

## 2012-05-18 NOTE — Progress Notes (Signed)
52 y.o. No obstetric history on file. Married Caucasian Fe here for annual exam.  No menses on Menastrin 24. Then changed to Micronor 12/13 secondary to elevated BP and headaches.  Since on Micronor no vaginal bleeding. She wants to come off Micronor at this time.  Last FSH 88 - 12/13 - did not want a repeat at this time. In general "warm" all the time.  Some vaginal dryness that is  getting worse. Using Xylocaine jelly prn. Doing OK with testosterone cream but now currently off .  No LMP recorded. Patient is not currently having periods (Reason: Oral contraceptives).          Sexually active: yes  The current method of family planning is oral progesterone-only contraceptive.    Exercising: no  The patient does not participate in regular exercise at present. Smoker:  no  Health Maintenance: Pap: 05/13/11 normal with neg HR HPV MMG: 08/15/11 normal Colonoscopy: 2007 recheck 10 yrs. BMD:  N/A TDaP:  05/13/11 Labs:Gets with PCP   reports that she quit smoking about 16 years ago. Her smoking use included Cigarettes. She has a 25 pack-year smoking history. She has never used smokeless tobacco. She reports that she does not drink alcohol or use illicit drugs.  Past Medical History  Diagnosis Date  . DEPRESSION   . GERD   . HYPERLIPIDEMIA   . MYALGIA   . BIPOLAR DISORDER UNSPECIFIED   . IRRITABLE BOWEL SYNDROME, HX OF   . Hypertension   . Atrial tachycardia   . Hypertension   . Headache     usually with cycle  . Bursitis of hip 09/1999    Bilateral - Dr. Luiz Blare  . Shortness of breath     Pulmonary eval 05/2002  . Mitral valve prolapse 12/17/2000    cardiolyte study 12/2000 neg  . Fistula of female genitalia 03/29/2007    enterovesical fistula, not reapired  . Anxiety   . Menopausal state 01/2012    [facility] = 88.5    Past Surgical History  Procedure Laterality Date  . Lipoma (r) side  1990    Right back    Current Outpatient Prescriptions  Medication Sig Dispense Refill  . buPROPion  (WELLBUTRIN XL) 150 MG 24 hr tablet Take 150 mg by mouth at bedtime.       . Cholecalciferol (VITAMIN D3) 1000 UNITS CAPS Take 1,000 Units by mouth at bedtime.       . clonazePAM (KLONOPIN) 0.5 MG tablet 1/2 to 1 tablet at night as directed.  30 tablet  3  . divalproex (DEPAKOTE) 500 MG 24 hr tablet Take 1,500 mg by mouth at bedtime.       Marland Kitchen esomeprazole (NEXIUM) 40 MG capsule Take 1 capsule (40 mg total) by mouth at bedtime.  90 capsule  3  . Fluocinolone Acetonide 0.01 % SHAM Massage into scalp and leave in 5 min then rinse.   May use 3x a week  120 mL  1  . fluticasone (FLONASE) 50 MCG/ACT nasal spray Place 2 sprays into the nose daily as needed. For allergies      . lidocaine (XYLOCAINE) 2 % jelly Apply topically as needed (for dysparunia).      . metoprolol succinate (TOPROL-XL) 50 MG 24 hr tablet Take 1 tablet (50 mg total) by mouth daily.  30 tablet  11  . nitrofurantoin, macrocrystal-monohydrate, (MACROBID) 100 MG capsule Take 100 mg by mouth once as needed (post coital).      . norethindrone (MICRONOR,CAMILA,ERRIN) 0.35  MG tablet Take 1 tablet by mouth daily.      . ORACEA 40 MG capsule TAKE 1 CAPSULE BY MOUTH ONCE DAILY  90 capsule  3  . tretinoin (RETIN-A) 0.025 % cream Apply 1 application topically at bedtime. Pt uses both strengths 0.025% and 0.05%  45 g  5   No current facility-administered medications for this visit.    Family History  Problem Relation Age of Onset  . Lung cancer Mother   . Hypertension Mother   . Hyperlipidemia Mother   . Cancer Father     Esophageal cancer  . Hyperlipidemia Father   . Colon cancer Other   . Sarcoidosis Other   . Testicular cancer Other     ROS:  Pertinent items are noted in HPI.  Otherwise, a comprehensive ROS was negative.  Exam:   BP 122/70  Pulse 68  Resp 16  Ht 5' 8.5" (1.74 m)  Wt 176 lb (79.833 kg)  BMI 26.37 kg/m2  Height: 5' 8.5" (174 cm)  Ht Readings from Last 3 Encounters:  05/18/12 5' 8.5" (1.74 m)  03/08/12 5\' 9"   (1.753 m)  12/03/11 5\' 9"  (1.753 m)    General appearance: alert, cooperative and appears stated age Head: Normocephalic, without obvious abnormality, atraumatic Neck: no adenopathy, supple, symmetrical, trachea midline and thyroid normal to inspection and palpation Lungs: clear to auscultation bilaterally Breasts: normal appearance, no masses or tenderness Heart: regular rate and rhythm Abdomen: soft, non-tender; bowel sounds normal; no masses,  no organomegaly Extremities: extremities normal, atraumatic, no cyanosis or edema Skin: Skin color, texture, turgor normal. No rashes or lesions Lymph nodes: Cervical, supraclavicular, and axillary nodes normal. No abnormal inguinal nodes palpated Neurologic: Grossly normal   Pelvic: External genitalia:  no lesions              Urethra:  normal appearing urethra with no masses, tenderness or lesions              Bartholin's and Skene's: normal                 Vagina: normal appearing vagina with atrophic changes and no discharge, no lesions              Cervix: anteverted              Pap taken: no Bimanual Exam:  Uterus:  normal size, contour, position, consistency, mobility, non-tender              Adnexa: normal adnexa               Rectovaginal: Confirms               Anus:  normal sphincter tone, no lesions  A:  Well Woman with normal exam  Peri - Menopausal on POP  Amenorrhea on OCP but increase in BP and HA"S  Atrophic Vaginitis  Decrease Libido - currently off Testerone cream  P:   Mammogram due 08/2012  pap smear as per guidelines  counseled on use and side effects of HRT    Pt. Will DC POP and monitor symptoms, if increase  in vaso symptoms and considers HRT, will come in  for consult.    Sample given of estrace vaginal cream 1 GM  intravaginally 2X week.  After about 6 weeks can  decrease to 1/2 GM.  If patient finds this is helpful to   CB for refill.    Refill given of testosterone cream 2 % in 60  GM  petrolatum to use  prn but no more than 1 x daily..   When / if she is using continuously she will come in  for a total testosterone level/    return annually or prn  An After Visit Summary was printed and given to the patient.

## 2012-05-18 NOTE — Patient Instructions (Addendum)
EXERCISE AND DIET:  We recommended that you start or continue a regular exercise program for good health. Regular exercise means any activity that makes your heart beat faster and makes you sweat.  We recommend exercising at least 30 minutes per day at least 3 days a week, preferably 4 or 5.  We also recommend a diet low in fat and sugar.  Inactivity, poor dietary choices and obesity can cause diabetes, heart attack, stroke, and kidney damage, among others.    ALCOHOL AND SMOKING:  Women should limit their alcohol intake to no more than 7 drinks/beers/glasses of wine (combined, not each!) per week. Moderation of alcohol intake to this level decreases your risk of breast cancer and liver damage. And of course, no recreational drugs are part of a healthy lifestyle.  And absolutely no smoking or even second hand smoke. Most people know smoking can cause heart and lung diseases, but did you know it also contributes to weakening of your bones? Aging of your skin?  Yellowing of your teeth and nails?  CALCIUM AND VITAMIN D:  Adequate intake of calcium and Vitamin D are recommended.  The recommendations for exact amounts of these supplements seem to change often, but generally speaking 600 mg of calcium (either carbonate or citrate) and 800 units of Vitamin D per day seems prudent. Certain women may benefit from higher intake of Vitamin D.  If you are among these women, your doctor will have told you during your visit.    PAP SMEARS:  Pap smears, to check for cervical cancer or precancers,  have traditionally been done yearly, although recent scientific advances have shown that most women can have pap smears less often.  However, every woman still should have a physical exam from her gynecologist every year. It will include a breast check, inspection of the vulva and vagina to check for abnormal growths or skin changes, a visual exam of the cervix, and then an exam to evaluate the size and shape of the uterus and  ovaries.  And after 53 years of age, a rectal exam is indicated to check for rectal cancers. We will also provide age appropriate advice regarding health maintenance, like when you should have certain vaccines, screening for sexually transmitted diseases, bone density testing, colonoscopy, mammograms, etc.   MAMMOGRAMS:  All women over 40 years old should have a yearly mammogram. Many facilities now offer a "3D" mammogram, which may cost around $50 extra out of pocket. If possible,  we recommend you accept the option to have the 3D mammogram performed.  It both reduces the number of women who will be called back for extra views which then turn out to be normal, and it is better than the routine mammogram at detecting truly abnormal areas.    COLONOSCOPY:  Colonoscopy to screen for colon cancer is recommended for all women at age 50.  We know, you hate the idea of the prep.  We agree, BUT, having colon cancer and not knowing it is worse!!  Colon cancer so often starts as a polyp that can be seen and removed at colonscopy, which can quite literally save your life!  And if your first colonoscopy is normal and you have no family history of colon cancer, most women don't have to have it again for 10 years.  Once every ten years, you can do something that may end up saving your life, right?  We will be happy to help you get it scheduled when you are ready.    Be sure to check your insurance coverage so you understand how much it will cost.  It may be covered as a preventative service at no cost, but you should check your particular policy.    Atrophic Vaginitis Atrophic vaginitis is a problem of low levels of estrogen in women. This problem can happen at any age. It is most common in women who have gone through menopause ("the change").  HOW WILL I KNOW IF I HAVE THIS PROBLEM? You may have:  Trouble with peeing (urinating), such as:  Going to the bathroom often.  A hard time holding your pee until you reach a  bathroom.  Leaking pee.  Having pain when you pee.  Itching or a burning feeling.  Vaginal bleeding and spotting.  Pain during sex.  Dryness of the vagina.  A yellow, bad-smelling fluid (discharge) coming from the vagina. HOW WILL MY DOCTOR CHECK FOR THIS PROBLEM?  During your exam, your doctor will likely find the problem.  If there is a vaginal fluid, it may be checked for infection. HOW WILL THIS PROBLEM BE TREATED? Keep the vulvar skin as clean as possible. Moisturizers and lubricants can help with some of the symptoms. Estrogen replacement can help. There are 2 ways to take estrogen:  Systemic estrogen gets estrogen to your whole body. It takes many weeks or months before the symptoms get better.  You take an estrogen pill.  You use a skin patch. This is a patch that you put on your skin.  If you still have your uterus, your doctor may ask you to take a hormone. Talk to your doctor about the right medicine for you.  Estrogen cream.  This puts estrogen only at the part of your body where you apply it. The cream is put into the vagina or put on the vulvar skin. For some women, estrogen cream works faster than pills or the patch. CAN ALL WOMEN WITH THIS PROBLEM USE ESTROGEN? No. Women with certain types of cancer, liver problems, or problems with blood clots should not take estrogen. Your doctor can help you decide the best treatment for your symptoms. Document Released: 07/16/2007 Document Revised: 04/21/2011 Document Reviewed: 07/16/2007 [employer] Patient Information 2013 [employer].   

## 2012-05-19 NOTE — Progress Notes (Signed)
Encounter reviewed by Dr. Brook Silva.  

## 2012-06-21 ENCOUNTER — Ambulatory Visit: Payer: Self-pay | Admitting: Neurology

## 2012-07-15 ENCOUNTER — Other Ambulatory Visit: Payer: Self-pay

## 2012-07-15 DIAGNOSIS — Z1231 Encounter for screening mammogram for malignant neoplasm of breast: Secondary | ICD-10-CM

## 2012-08-17 ENCOUNTER — Ambulatory Visit
Admission: RE | Admit: 2012-08-17 | Discharge: 2012-08-17 | Disposition: A | Discharge status: Home / Self Care | Payer: 59 | Source: Ambulatory Visit

## 2012-08-17 DIAGNOSIS — Z1231 Encounter for screening mammogram for malignant neoplasm of breast: Secondary | ICD-10-CM

## 2012-09-16 ENCOUNTER — Ambulatory Visit (INDEPENDENT_AMBULATORY_CARE_PROVIDER_SITE_OTHER): Payer: 59 | Admitting: Family Medicine

## 2012-09-16 ENCOUNTER — Encounter: Payer: Self-pay | Admitting: Family Medicine

## 2012-09-16 VITALS — BP 122/74 | HR 87 | Temp 98.0°F | Ht 68.75 in | Wt 176.2 lb

## 2012-09-16 DIAGNOSIS — I498 Other specified cardiac arrhythmias: Secondary | ICD-10-CM

## 2012-09-16 DIAGNOSIS — N951 Menopausal and female climacteric states: Secondary | ICD-10-CM

## 2012-09-16 DIAGNOSIS — I10 Essential (primary) hypertension: Secondary | ICD-10-CM

## 2012-09-16 DIAGNOSIS — I471 Supraventricular tachycardia, unspecified: Secondary | ICD-10-CM

## 2012-09-16 DIAGNOSIS — F3181 Bipolar II disorder: Secondary | ICD-10-CM

## 2012-09-16 DIAGNOSIS — E039 Hypothyroidism, unspecified: Secondary | ICD-10-CM

## 2012-09-16 DIAGNOSIS — E049 Nontoxic goiter, unspecified: Secondary | ICD-10-CM

## 2012-09-16 DIAGNOSIS — Z0000 Encounter for general adult medical examination without abnormal findings: Secondary | ICD-10-CM

## 2012-09-16 DIAGNOSIS — L678 Other hair color and hair shaft abnormalities: Secondary | ICD-10-CM

## 2012-09-16 DIAGNOSIS — R232 Flushing: Secondary | ICD-10-CM

## 2012-09-16 DIAGNOSIS — F319 Bipolar disorder, unspecified: Secondary | ICD-10-CM

## 2012-09-16 DIAGNOSIS — L738 Other specified follicular disorders: Secondary | ICD-10-CM

## 2012-09-16 DIAGNOSIS — F3189 Other bipolar disorder: Secondary | ICD-10-CM

## 2012-09-16 NOTE — Assessment & Plan Note (Signed)
Check labs On no meds 

## 2012-09-16 NOTE — Assessment & Plan Note (Signed)
Per cards Palpitations are worse--- she will see cards in next few days

## 2012-09-16 NOTE — Patient Instructions (Addendum)
Preventive Care for Adults, Female A healthy lifestyle and preventive care can promote health and wellness. Preventive health guidelines for women include the following key practices.  A routine yearly physical is a good way to check with your caregiver about your health and preventive screening. It is a chance to share any concerns and updates on your health, and to receive a thorough exam.  Visit your dentist for a routine exam and preventive care every 6 months. Brush your teeth twice a day and floss once a day. Good oral hygiene prevents tooth decay and gum disease.  The frequency of eye exams is based on your age, health, family medical history, use of contact lenses, and other factors. Follow your caregiver's recommendations for frequency of eye exams.  Eat a healthy diet. Foods like vegetables, fruits, whole grains, low-fat dairy products, and lean protein foods contain the nutrients you need without too many calories. Decrease your intake of foods high in solid fats, added sugars, and salt. Eat the right amount of calories for you.Get information about a proper diet from your caregiver, if necessary.  Regular physical exercise is one of the most important things you can do for your health. Most adults should get at least 150 minutes of moderate-intensity exercise (any activity that increases your heart rate and causes you to sweat) each week. In addition, most adults need muscle-strengthening exercises on 2 or more days a week.  Maintain a healthy weight. The body mass index (BMI) is a screening tool to identify possible weight problems. It provides an estimate of body fat based on height and weight. Your caregiver can help determine your BMI, and can help you achieve or maintain a healthy weight.For adults 20 years and older:  A BMI below 18.5 is considered underweight.  A BMI of 18.5 to 24.9 is normal.  A BMI of 25 to 29.9 is considered overweight.  A BMI of 30 and above is  considered obese.  Maintain normal blood lipids and cholesterol levels by exercising and minimizing your intake of saturated fat. Eat a balanced diet with plenty of fruit and vegetables. Blood tests for lipids and cholesterol should begin at age 20 and be repeated every 5 years. If your lipid or cholesterol levels are high, you are over 50, or you are at high risk for heart disease, you may need your cholesterol levels checked more frequently.Ongoing high lipid and cholesterol levels should be treated with medicines if diet and exercise are not effective.  If you smoke, find out from your caregiver how to quit. If you do not use tobacco, do not start.  If you are pregnant, do not drink alcohol. If you are breastfeeding, be very cautious about drinking alcohol. If you are not pregnant and choose to drink alcohol, do not exceed 1 drink per day. One drink is considered to be 12 ounces (355 mL) of beer, 5 ounces (148 mL) of wine, or 1.5 ounces (44 mL) of liquor.  Avoid use of street drugs. Do not share needles with anyone. Ask for help if you need support or instructions about stopping the use of drugs.  High blood pressure causes heart disease and increases the risk of stroke. Your blood pressure should be checked at least every 1 to 2 years. Ongoing high blood pressure should be treated with medicines if weight loss and exercise are not effective.  If you are 55 to 53 years old, ask your caregiver if you should take aspirin to prevent strokes.  Diabetes   screening involves taking a blood sample to check your fasting blood sugar level. This should be done once every 3 years, after age 45, if you are within normal weight and without risk factors for diabetes. Testing should be considered at a younger age or be carried out more frequently if you are overweight and have at least 1 risk factor for diabetes.  Breast cancer screening is essential preventive care for women. You should practice "breast  self-awareness." This means understanding the normal appearance and feel of your breasts and may include breast self-examination. Any changes detected, no matter how small, should be reported to a caregiver. Women in their 20s and 30s should have a clinical breast exam (CBE) by a caregiver as part of a regular health exam every 1 to 3 years. After age 40, women should have a CBE every year. Starting at age 40, women should consider having a mammography (breast X-ray test) every year. Women who have a family history of breast cancer should talk to their caregiver about genetic screening. Women at a high risk of breast cancer should talk to their caregivers about having magnetic resonance imaging (MRI) and a mammography every year.  The Pap test is a screening test for cervical cancer. A Pap test can show cell changes on the cervix that might become cervical cancer if left untreated. A Pap test is a procedure in which cells are obtained and examined from the lower end of the uterus (cervix).  Women should have a Pap test starting at age 21.  Between ages 21 and 29, Pap tests should be repeated every 2 years.  Beginning at age 30, you should have a Pap test every 3 years as long as the past 3 Pap tests have been normal.  Some women have medical problems that increase the chance of getting cervical cancer. Talk to your caregiver about these problems. It is especially important to talk to your caregiver if a new problem develops soon after your last Pap test. In these cases, your caregiver may recommend more frequent screening and Pap tests.  The above recommendations are the same for women who have or have not gotten the vaccine for human papillomavirus (HPV).  If you had a hysterectomy for a problem that was not cancer or a condition that could lead to cancer, then you no longer need Pap tests. Even if you no longer need a Pap test, a regular exam is a good idea to make sure no other problems are  starting.  If you are between ages 65 and 70, and you have had normal Pap tests going back 10 years, you no longer need Pap tests. Even if you no longer need a Pap test, a regular exam is a good idea to make sure no other problems are starting.  If you have had past treatment for cervical cancer or a condition that could lead to cancer, you need Pap tests and screening for cancer for at least 20 years after your treatment.  If Pap tests have been discontinued, risk factors (such as a new sexual partner) need to be reassessed to determine if screening should be resumed.  The HPV test is an additional test that may be used for cervical cancer screening. The HPV test looks for the virus that can cause the cell changes on the cervix. The cells collected during the Pap test can be tested for HPV. The HPV test could be used to screen women aged 30 years and older, and should   be used in women of any age who have unclear Pap test results. After the age of 30, women should have HPV testing at the same frequency as a Pap test.  Colorectal cancer can be detected and often prevented. Most routine colorectal cancer screening begins at the age of 50 and continues through age 75. However, your caregiver may recommend screening at an earlier age if you have risk factors for colon cancer. On a yearly basis, your caregiver may provide home test kits to check for hidden blood in the stool. Use of a small camera at the end of a tube, to directly examine the colon (sigmoidoscopy or colonoscopy), can detect the earliest forms of colorectal cancer. Talk to your caregiver about this at age 50, when routine screening begins. Direct examination of the colon should be repeated every 5 to 10 years through age 75, unless early forms of pre-cancerous polyps or small growths are found.  Hepatitis C blood testing is recommended for all people born from 1945 through 1965 and any individual with known risks for hepatitis C.  Practice  safe sex. Use condoms and avoid high-risk sexual practices to reduce the spread of sexually transmitted infections (STIs). STIs include gonorrhea, chlamydia, syphilis, trichomonas, herpes, HPV, and human immunodeficiency virus (HIV). Herpes, HIV, and HPV are viral illnesses that have no cure. They can result in disability, cancer, and death. Sexually active women aged 25 and younger should be checked for chlamydia. Older women with new or multiple partners should also be tested for chlamydia. Testing for other STIs is recommended if you are sexually active and at increased risk.  Osteoporosis is a disease in which the bones lose minerals and strength with aging. This can result in serious bone fractures. The risk of osteoporosis can be identified using a bone density scan. Women ages 65 and over and women at risk for fractures or osteoporosis should discuss screening with their caregivers. Ask your caregiver whether you should take a calcium supplement or vitamin D to reduce the rate of osteoporosis.  Menopause can be associated with physical symptoms and risks. Hormone replacement therapy is available to decrease symptoms and risks. You should talk to your caregiver about whether hormone replacement therapy is right for you.  Use sunscreen with sun protection factor (SPF) of 30 or more. Apply sunscreen liberally and repeatedly throughout the day. You should seek shade when your shadow is shorter than you. Protect yourself by wearing long sleeves, pants, a wide-brimmed hat, and sunglasses year round, whenever you are outdoors.  Once a month, do a whole body skin exam, using a mirror to look at the skin on your back. Notify your caregiver of new moles, moles that have irregular borders, moles that are larger than a pencil eraser, or moles that have changed in shape or color.  Stay current with required immunizations.  Influenza. You need a dose every fall (or winter). The composition of the flu vaccine  changes each year, so being vaccinated once is not enough.  Pneumococcal polysaccharide. You need 1 to 2 doses if you smoke cigarettes or if you have certain chronic medical conditions. You need 1 dose at age 65 (or older) if you have never been vaccinated.  Tetanus, diphtheria, pertussis (Tdap, Td). Get 1 dose of Tdap vaccine if you are younger than age 65, are over 65 and have contact with an infant, are a healthcare worker, are pregnant, or simply want to be protected from whooping cough. After that, you need a Td   booster dose every 10 years. Consult your caregiver if you have not had at least 3 tetanus and diphtheria-containing shots sometime in your life or have a deep or dirty wound.  HPV. You need this vaccine if you are a woman age 26 or younger. The vaccine is given in 3 doses over 6 months.  Measles, mumps, rubella (MMR). You need at least 1 dose of MMR if you were born in 1957 or later. You may also need a second dose.  Meningococcal. If you are age 19 to 21 and a first-year college student living in a residence hall, or have one of several medical conditions, you need to get vaccinated against meningococcal disease. You may also need additional booster doses.  Zoster (shingles). If you are age 60 or older, you should get this vaccine.  Varicella (chickenpox). If you have never had chickenpox or you were vaccinated but received only 1 dose, talk to your caregiver to find out if you need this vaccine.  Hepatitis A. You need this vaccine if you have a specific risk factor for hepatitis A virus infection or you simply wish to be protected from this disease. The vaccine is usually given as 2 doses, 6 to 18 months apart.  Hepatitis B. You need this vaccine if you have a specific risk factor for hepatitis B virus infection or you simply wish to be protected from this disease. The vaccine is given in 3 doses, usually over 6 months. Preventive Services / Frequency Ages 19 to 39  Blood  pressure check.** / Every 1 to 2 years.  Lipid and cholesterol check.** / Every 5 years beginning at age 20.  Clinical breast exam.** / Every 3 years for women in their 20s and 30s.  Pap test.** / Every 2 years from ages 21 through 29. Every 3 years starting at age 30 through age 65 or 70 with a history of 3 consecutive normal Pap tests.  HPV screening.** / Every 3 years from ages 30 through ages 65 to 70 with a history of 3 consecutive normal Pap tests.  Hepatitis C blood test.** / For any individual with known risks for hepatitis C.  Skin self-exam. / Monthly.  Influenza immunization.** / Every year.  Pneumococcal polysaccharide immunization.** / 1 to 2 doses if you smoke cigarettes or if you have certain chronic medical conditions.  Tetanus, diphtheria, pertussis (Tdap, Td) immunization. / A one-time dose of Tdap vaccine. After that, you need a Td booster dose every 10 years.  HPV immunization. / 3 doses over 6 months, if you are 26 and younger.  Measles, mumps, rubella (MMR) immunization. / You need at least 1 dose of MMR if you were born in 1957 or later. You may also need a second dose.  Meningococcal immunization. / 1 dose if you are age 19 to 21 and a first-year college student living in a residence hall, or have one of several medical conditions, you need to get vaccinated against meningococcal disease. You may also need additional booster doses.  Varicella immunization.** / Consult your caregiver.  Hepatitis A immunization.** / Consult your caregiver. 2 doses, 6 to 18 months apart.  Hepatitis B immunization.** / Consult your caregiver. 3 doses usually over 6 months. Ages 40 to 64  Blood pressure check.** / Every 1 to 2 years.  Lipid and cholesterol check.** / Every 5 years beginning at age 20.  Clinical breast exam.** / Every year after age 40.  Mammogram.** / Every year beginning at age 40   and continuing for as long as you are in good health. Consult with your  caregiver.  Pap test.** / Every 3 years starting at age 30 through age 65 or 70 with a history of 3 consecutive normal Pap tests.  HPV screening.** / Every 3 years from ages 30 through ages 65 to 70 with a history of 3 consecutive normal Pap tests.  Fecal occult blood test (FOBT) of stool. / Every year beginning at age 50 and continuing until age 75. You may not need to do this test if you get a colonoscopy every 10 years.  Flexible sigmoidoscopy or colonoscopy.** / Every 5 years for a flexible sigmoidoscopy or every 10 years for a colonoscopy beginning at age 50 and continuing until age 75.  Hepatitis C blood test.** / For all people born from 1945 through 1965 and any individual with known risks for hepatitis C.  Skin self-exam. / Monthly.  Influenza immunization.** / Every year.  Pneumococcal polysaccharide immunization.** / 1 to 2 doses if you smoke cigarettes or if you have certain chronic medical conditions.  Tetanus, diphtheria, pertussis (Tdap, Td) immunization.** / A one-time dose of Tdap vaccine. After that, you need a Td booster dose every 10 years.  Measles, mumps, rubella (MMR) immunization. / You need at least 1 dose of MMR if you were born in 1957 or later. You may also need a second dose.  Varicella immunization.** / Consult your caregiver.  Meningococcal immunization.** / Consult your caregiver.  Hepatitis A immunization.** / Consult your caregiver. 2 doses, 6 to 18 months apart.  Hepatitis B immunization.** / Consult your caregiver. 3 doses, usually over 6 months. Ages 65 and over  Blood pressure check.** / Every 1 to 2 years.  Lipid and cholesterol check.** / Every 5 years beginning at age 20.  Clinical breast exam.** / Every year after age 40.  Mammogram.** / Every year beginning at age 40 and continuing for as long as you are in good health. Consult with your caregiver.  Pap test.** / Every 3 years starting at age 30 through age 65 or 70 with a 3  consecutive normal Pap tests. Testing can be stopped between 65 and 70 with 3 consecutive normal Pap tests and no abnormal Pap or HPV tests in the past 10 years.  HPV screening.** / Every 3 years from ages 30 through ages 65 or 70 with a history of 3 consecutive normal Pap tests. Testing can be stopped between 65 and 70 with 3 consecutive normal Pap tests and no abnormal Pap or HPV tests in the past 10 years.  Fecal occult blood test (FOBT) of stool. / Every year beginning at age 50 and continuing until age 75. You may not need to do this test if you get a colonoscopy every 10 years.  Flexible sigmoidoscopy or colonoscopy.** / Every 5 years for a flexible sigmoidoscopy or every 10 years for a colonoscopy beginning at age 50 and continuing until age 75.  Hepatitis C blood test.** / For all people born from 1945 through 1965 and any individual with known risks for hepatitis C.  Osteoporosis screening.** / A one-time screening for women ages 65 and over and women at risk for fractures or osteoporosis.  Skin self-exam. / Monthly.  Influenza immunization.** / Every year.  Pneumococcal polysaccharide immunization.** / 1 dose at age 65 (or older) if you have never been vaccinated.  Tetanus, diphtheria, pertussis (Tdap, Td) immunization. / A one-time dose of Tdap vaccine if you are over   65 and have contact with an infant, are a healthcare worker, or simply want to be protected from whooping cough. After that, you need a Td booster dose every 10 years.  Varicella immunization.** / Consult your caregiver.  Meningococcal immunization.** / Consult your caregiver.  Hepatitis A immunization.** / Consult your caregiver. 2 doses, 6 to 18 months apart.  Hepatitis B immunization.** / Check with your caregiver. 3 doses, usually over 6 months. ** Family history and personal history of risk and conditions may change your caregiver's recommendations. Document Released: 03/25/2001 Document Revised: 04/21/2011  Document Reviewed: 06/24/2010 [employer] Patient Information 2014 [employer].  

## 2012-09-16 NOTE — Assessment & Plan Note (Signed)
Check labs Stable Con' t meds

## 2012-09-16 NOTE — Assessment & Plan Note (Signed)
Check depakote level Per psych

## 2012-09-16 NOTE — Progress Notes (Signed)
Subjective:     Beverly Cole is a 52 y.o. female and is here for a comprehensive physical exam. The patient reports no problems.  History   Social History  . Marital Status: Married    Spouse Name: N/A    Number of Children: 0  . Years of Education: N/A   Occupational History  . 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 Smoking status: Former Smoker -- 1.00 packs/day for 25 years    Types: Cigarettes    Quit date: 02/11/1996  . Smokeless tobacco: Never Used     Comment: Married, lives with spouse. 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 Alcohol Use: No  . Drug Use: No  . Sexually Active: Yes -- Female partner(s)    Birth Control/ Protection: Pill   Other Topics Concern  . Not on file   Social History Narrative   Married, lives with spouse in [place]. 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 Influenza Vaccine  10/11/2012  . Pap Smear  05/13/2014  . Mammogram  08/18/2014  . Colonoscopy  10/28/2015  . Tetanus/tdap  05/12/2021    The following portions of the patient's history were reviewed and updated as appropriate:  She  has a past medical history of DEPRESSION; GERD; HYPERLIPIDEMIA; MYALGIA; BIPOLAR DISORDER UNSPECIFIED; IRRITABLE BOWEL SYNDROME, HX OF; Hypertension; Atrial tachycardia; Hypertension; Headache(784.0); Bursitis of hip (09/1999); Shortness of breath; Mitral valve prolapse (12/17/2000); Fistula of female genitalia (03/29/2007); Anxiety; and Menopausal state (01/2012). She  does not have any pertinent problems on file. She  has past surgical history that includes Lipoma (R) side (1990). Her family history includes Cancer in her father; Colon cancer in her other; Hyperlipidemia in her father and mother; Hypertension in her mother; Lung cancer in her mother; Sarcoidosis in her other; and Testicular cancer in her other. She  reports that she quit smoking about 16 years ago. Her smoking use included  Cigarettes. She has a 25 pack-year smoking history. She has never used smokeless tobacco. She reports that she does not drink alcohol or use illicit drugs. She has a current medication list which includes the following prescription(s): bupropion, vitamin d3, clonazepam, divalproex, esomeprazole, fluocinolone acetonide, fluticasone, lidocaine, metoprolol succinate, nitrofurantoin (macrocrystal-monohydrate), oracea, and tretinoin. Current Outpatient Prescriptions on File Prior to Visit  Medication Sig Dispense Refill  . buPROPion (WELLBUTRIN XL) 150 MG 24 hr tablet Take 150 mg by mouth at bedtime.       . Cholecalciferol (VITAMIN D3) 1000 UNITS CAPS Take 1,000 Units by mouth at bedtime.       . clonazePAM (KLONOPIN) 0.5 MG tablet 1/2 to 1 tablet at night as directed.  30 tablet  3  . divalproex (DEPAKOTE) 500 MG 24 hr tablet Take 1,500 mg by mouth at bedtime.       Marland Kitchen esomeprazole (NEXIUM) 40 MG capsule Take 1 capsule (40 mg total) by mouth at bedtime.  90 capsule  3  . Fluocinolone Acetonide 0.01 % SHAM Massage into scalp and leave in 5 min then rinse.   May use 3x a week  120 mL  1  . fluticasone (FLONASE) 50 MCG/ACT nasal spray Place 2 sprays into the nose daily as needed. For allergies      . lidocaine (XYLOCAINE) 2 % jelly Apply topically as needed (for dysparunia).  30 mL  12  . metoprolol succinate (TOPROL-XL) 50 MG 24 hr tablet  Take 1 tablet (50 mg total) by mouth daily.  30 tablet  11  . nitrofurantoin, macrocrystal-monohydrate, (MACROBID) 100 MG capsule Take 100 mg by mouth once as needed (post coital).      . ORACEA 40 MG capsule TAKE 1 CAPSULE BY MOUTH ONCE DAILY  90 capsule  3  . tretinoin (RETIN-A) 0.025 % cream Apply 1 application topically at bedtime. Pt uses both strengths 0.025% and 0.05%  45 g  5   No current facility-administered medications on file prior to visit.   She has No Known Allergies..  Review of Systems Review of Systems  Constitutional: Negative for activity  change, appetite change and fatigue.  HENT: Negative for hearing loss, congestion, tinnitus and ear discharge.  dentist q3m Eyes: Negative for visual disturbance (see optho q1y -- vision corrected to 20/20 with glasses).  Respiratory: Negative for cough, chest tightness and shortness of breath.   Cardiovascular: Negative for chest pain, palpitations and leg swelling.  Gastrointestinal: Negative for abdominal pain, diarrhea, constipation and abdominal distention.  Genitourinary: Negative for urgency, frequency, decreased urine volume and difficulty urinating.  Musculoskeletal: Negative for back pain, arthralgias and gait problem.  Skin: Negative for color change, pallor and rash.  Neurological: Negative for dizziness, light-headedness, numbness and headaches.  Hematological: Negative for adenopathy. Does not bruise/bleed easily.  Psychiatric/Behavioral: Negative for suicidal ideas, confusion, sleep disturbance, self-injury, dysphoric mood, decreased concentration and agitation.       Objective:    BP 122/74  Pulse 87  Temp(Src) 98 F (36.7 C) (Oral)  Ht 5' 8.75" (1.746 m)  Wt 176 lb 3.2 oz (79.924 kg)  BMI 26.22 kg/m2  SpO2 96% General appearance: alert, cooperative, appears stated age and no distress Head: Normocephalic, without obvious abnormality, atraumatic Eyes: conjunctivae/corneas clear. PERRL, EOM's intact. Fundi benign. Ears: normal TM's and external ear canals both ears Nose: Nares normal. Septum midline. Mucosa normal. No drainage or sinus tenderness. Throat: lips, mucosa, and tongue normal; teeth and gums normal Neck: no adenopathy, supple, symmetrical, trachea midline and thyroid not enlarged, symmetric, no tenderness/mass/nodules Back: symmetric, no curvature. ROM normal. No CVA tenderness. Lungs: clear to auscultation bilaterally Breasts: deferred Heart: regular rate and rhythm, S1, S2 normal, no murmur, click, rub or gallop Abdomen: soft, non-tender; bowel sounds  normal; no masses,  no organomegaly Pelvic: deferred--gyn Extremities: extremities normal, atraumatic, no cyanosis or edema Pulses: 2+ and symmetric Skin: Skin color, texture, turgor normal. No rashes or lesions Lymph nodes: Cervical, supraclavicular, and axillary nodes normal. Neurologic: Alert and oriented X 3, normal strength and tone. Normal symmetric reflexes. Normal coordination and gait Psych-- no signs of depression, no anxiety      Assessment:    Healthy female exam.      Plan:    ghm utd Check labs See After Visit Summary for Counseling Recommendations

## 2012-09-20 ENCOUNTER — Encounter: Payer: Self-pay | Admitting: Cardiovascular Disease

## 2012-09-20 ENCOUNTER — Ambulatory Visit (INDEPENDENT_AMBULATORY_CARE_PROVIDER_SITE_OTHER): Payer: 59 | Admitting: Cardiovascular Disease

## 2012-09-20 VITALS — BP 133/84 | HR 81 | Ht 68.5 in | Wt 179.4 lb

## 2012-09-20 DIAGNOSIS — I340 Nonrheumatic mitral (valve) insufficiency: Secondary | ICD-10-CM

## 2012-09-20 DIAGNOSIS — I059 Rheumatic mitral valve disease, unspecified: Secondary | ICD-10-CM

## 2012-09-20 DIAGNOSIS — R002 Palpitations: Secondary | ICD-10-CM

## 2012-09-20 DIAGNOSIS — I498 Other specified cardiac arrhythmias: Secondary | ICD-10-CM

## 2012-09-20 DIAGNOSIS — I471 Supraventricular tachycardia: Secondary | ICD-10-CM

## 2012-09-20 MED ORDER — METOPROLOL SUCCINATE ER 100 MG PO TB24: 100.0000 mg | ORAL_TABLET | Freq: Every day | ORAL | Status: DC

## 2012-09-20 NOTE — Progress Notes (Signed)
History of Present Illness: 52 yo WF with history of HLD, HTN, Bipolar disorder, IBS, Mitral valve prolapse here today for cardiac follow up. She was seen as a new patient 10/08/11 to establish cardiology care. She was diagnosed with MVP in the 1980s. She told me that she had been having sharp pains in her chest, occasionally associated with SOB. These episodes lasted for a few minutes. She was seen in the ED at [facility] 08/11/11 and had a coronary CT 08/12/11 which showed normal coronary arteries. Also notes skipped heart beats several times per week. I ordered an echo which showed normal LV size and function, mild MR. A 48 hour Holter monitor showed NSR with bradycardia, PACs and runs of SVT with short run of atrial fibrillation. She was seen in EP clinic by Dr. Johney Frame October 2013 and he felt that this was most likely a long RP tachycardia. He did not recommend a change in therapy but suggested Flecainide if symptoms worsened and EP f/u only if there were changes. She was last seen in EP clinic in January 2014.   She is here for follow up. She tells me that she has had several episodes of palpitations over the last few months. She thinks these occur about once every three weeks. Each episode lasts for 15-30 seconds. No near syncope, syncope or dizziness.   Primary Care Physician: Anetta, Olvera  Last Lipid Profile:Lipid Panel     Component Value Date/Time   CHOL 248* 03/05/2012 1139   TRIG 192.0* 03/05/2012 1139   HDL 35.50* 03/05/2012 1139   CHOLHDL 7 03/05/2012 1139   VLDL 38.4 03/05/2012 1139   LDLCALC 110* 04/05/2010 0940     Past Medical History  Diagnosis Date  . DEPRESSION   . GERD   . HYPERLIPIDEMIA   . MYALGIA   . BIPOLAR DISORDER UNSPECIFIED   . IRRITABLE BOWEL SYNDROME, HX OF   . Hypertension   . Atrial tachycardia   . Hypertension   . Headache(784.0)     usually with cycle  . Bursitis of hip 09/1999    Bilateral - Dr. Luiz Blare  . Shortness of breath     Pulmonary eval 05/2002  .  Mitral valve prolapse 12/17/2000    cardiolyte study 12/2000 neg  . Fistula of female genitalia 03/29/2007    enterovesical fistula, not reapired  . Anxiety   . Menopausal state 01/2012    [facility] = 88.5    Past Surgical History  Procedure Laterality Date  . Lipoma (r) side  1990    Right back    Current Outpatient Prescriptions  Medication Sig Dispense Refill  . buPROPion (WELLBUTRIN XL) 150 MG 24 hr tablet Take 150 mg by mouth at bedtime.       . Cholecalciferol (VITAMIN D3) 1000 UNITS CAPS Take 1,000 Units by mouth at bedtime.       . clonazePAM (KLONOPIN) 0.5 MG tablet 1/2 to 1 tablet at night as directed.  30 tablet  3  . divalproex (DEPAKOTE) 500 MG 24 hr tablet Take 1,500 mg by mouth at bedtime.       Marland Kitchen esomeprazole (NEXIUM) 40 MG capsule Take 1 capsule (40 mg total) by mouth at bedtime.  90 capsule  3  . Fluocinolone Acetonide 0.01 % SHAM Massage into scalp and leave in 5 min then rinse.   May use 3x a week  120 mL  1  . fluticasone (FLONASE) 50 MCG/ACT nasal spray Place 2 sprays into the nose daily as  needed. For allergies      . lidocaine (XYLOCAINE) 2 % jelly Apply topically as needed (for dysparunia).  30 mL  12  . metoprolol succinate (TOPROL-XL) 50 MG 24 hr tablet Take 1 tablet (50 mg total) by mouth daily.  30 tablet  11  . nitrofurantoin, macrocrystal-monohydrate, (MACROBID) 100 MG capsule Take 100 mg by mouth once as needed (post coital).      . ORACEA 40 MG capsule TAKE 1 CAPSULE BY MOUTH ONCE DAILY  90 capsule  3  . tretinoin (RETIN-A) 0.025 % cream Apply 1 application topically at bedtime. Pt uses both strengths 0.025% and 0.05%  45 g  5   No current facility-administered medications for this visit.    No Known Allergies  History   Social History  . Marital Status: Married    Spouse Name: N/A    Number of Children: 0  . Years of Education: N/A   Occupational History  . 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 Smoking status: Former Smoker --  1.00 packs/day for 25 years    Types: Cigarettes    Quit date: 02/11/1996  . Smokeless tobacco: Never Used     Comment: Married, lives with spouse. 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 Alcohol Use: No  . Drug Use: No  . Sexually Active: Yes -- Female partner(s)    Birth Control/ Protection: Pill   Other Topics Concern  . Not on file   Social History Narrative   Married, lives with spouse in [place]. 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 Lung cancer Mother   . Hypertension Mother   . Hyperlipidemia Mother   . Cancer Father     Esophageal cancer  . Hyperlipidemia Father   . Colon cancer Other   . Sarcoidosis Other   . Testicular cancer Other     Review of Systems:  As stated in the HPI and otherwise negative.   BP 133/84  Pulse 81  Ht 5' 8.5" (1.74 m)  Wt 179 lb 6.4 oz (81.375 kg)  BMI 26.88 kg/m2  Physical Examination: General: Well developed, well nourished, NAD HEENT: OP clear, mucus membranes moist SKIN: warm, dry. No rashes. Neuro: No focal deficits Musculoskeletal: Muscle strength 5/5 all ext Psychiatric: Mood and affect normal Neck: No JVD, no carotid bruits, no thyromegaly, no lymphadenopathy. Lungs:Clear bilaterally, no wheezes, rhonci, crackles Cardiovascular: Regular rate and rhythm. No murmurs, gallops or rubs. Abdomen:Soft. Bowel sounds present. Non-tender.  Extremities: No lower extremity edema. Pulses are 2 + in the bilateral DP/PT.  Assessment and Plan:   1. SVT: She is known to have a long RP tachycardia. She has done quite well over the last year with Toprol therapy. She is having palpitations once per month but short lived. Will increase Toprol XL to 100 mg po Qdaily. If she has worsening of symptoms, would start Flecainide and arrange treadmill but she does not wish to do this at this time. Will see her back in 3 months.   2. Mitral regurgitation: mild by echo September 2013.

## 2012-09-20 NOTE — Patient Instructions (Addendum)
Your physician recommends that you schedule a follow-up appointment in:  3 months. --December 21, 2012 at 1:45  Your physician has recommended you make the following change in your medication:  Increase Toprol to 100 mg by mouth daily

## 2012-10-13 ENCOUNTER — Ambulatory Visit (HOSPITAL_COMMUNITY)
Admission: RE | Admit: 2012-10-13 | Discharge: 2012-10-13 | Disposition: A | Discharge status: Home / Self Care | Payer: 59 | Source: Ambulatory Visit | Attending: Family Medicine | Admitting: Family Medicine

## 2012-10-13 DIAGNOSIS — R232 Flushing: Secondary | ICD-10-CM

## 2012-10-13 DIAGNOSIS — E049 Nontoxic goiter, unspecified: Secondary | ICD-10-CM | POA: Insufficient documentation

## 2012-10-13 DIAGNOSIS — L678 Other hair color and hair shaft abnormalities: Secondary | ICD-10-CM

## 2012-10-14 ENCOUNTER — Telehealth: Payer: Self-pay

## 2012-10-14 DIAGNOSIS — E041 Nontoxic single thyroid nodule: Secondary | ICD-10-CM

## 2012-10-14 DIAGNOSIS — E042 Nontoxic multinodular goiter: Secondary | ICD-10-CM

## 2012-10-14 NOTE — Telephone Encounter (Signed)
Message copied by Arnette Norris on Thu Oct 14, 2012  3:38 PM ------      Message from: Lelon Perla      Created: Wed Oct 13, 2012  4:57 PM       Nonspecific nodules--- too small for bx      Can refer to endo if symptoms persist ------

## 2012-10-14 NOTE — Telephone Encounter (Signed)
Discussed with patient and she decided to go ahead with the Endo referral. Ref in      KP

## 2012-10-18 ENCOUNTER — Other Ambulatory Visit: Payer: Self-pay | Admitting: Family Medicine

## 2012-10-26 ENCOUNTER — Encounter: Payer: Self-pay | Admitting: Endocrinology

## 2012-10-26 ENCOUNTER — Ambulatory Visit (INDEPENDENT_AMBULATORY_CARE_PROVIDER_SITE_OTHER): Payer: 59 | Admitting: Endocrinology

## 2012-10-26 VITALS — BP 136/80 | HR 78 | Ht 68.0 in | Wt 178.0 lb

## 2012-10-26 DIAGNOSIS — E042 Nontoxic multinodular goiter: Secondary | ICD-10-CM

## 2012-10-26 NOTE — Progress Notes (Signed)
Subjective:    Patient ID: Beverly Cole, female    DOB: Nov 14, 1960, 52 y.o.   MRN: [id number]  HPI Pt states 1 month of a slight sensation of discomfort in the anterior neck, and assoc hair loss. Past Medical History  Diagnosis Date  . DEPRESSION   . GERD   . HYPERLIPIDEMIA   . MYALGIA   . BIPOLAR DISORDER UNSPECIFIED   . IRRITABLE BOWEL SYNDROME, HX OF   . Hypertension   . Atrial tachycardia   . Hypertension   . Headache(784.0)     usually with cycle  . Bursitis of hip 09/1999    Bilateral - Dr. Luiz Blare  . Shortness of breath     Pulmonary eval 05/2002  . Mitral valve prolapse 12/17/2000    cardiolyte study 12/2000 neg  . Fistula of female genitalia 03/29/2007    enterovesical fistula, not reapired  . Anxiety   . Menopausal state 01/2012    [facility] = 88.5    Past Surgical History  Procedure Laterality Date  . Lipoma (r) side  1990    Right back    History   Social History  . Marital Status: Married    Spouse Name: N/A    Number of Children: 0  . Years of Education: N/A   Occupational History  . 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 Smoking status: Former Smoker -- 1.00 packs/day for 25 years    Types: Cigarettes    Quit date: 02/11/1996  . Smokeless tobacco: Never Used     Comment: Married, lives with spouse. 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 Alcohol Use: No  . Drug Use: No  . Sexual Activity: Yes    Partners: Male    Birth Control/ Protection: Pill   Other Topics Concern  . Not on file   Social History Narrative   Married, lives with spouse in [place]. 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 buPROPion (WELLBUTRIN XL) 150 MG 24 hr tablet Take 150 mg by mouth at bedtime.       . Cholecalciferol (VITAMIN D3) 1000 UNITS CAPS Take 1,000 Units by mouth at bedtime.       . clonazePAM (KLONOPIN) 0.5 MG tablet 1/2 to 1 tablet at  night as directed.  30 tablet  3  . divalproex (DEPAKOTE) 500 MG 24 hr tablet Take 1,500 mg by mouth at bedtime.       . Fluocinolone Acetonide 0.01 % SHAM Massage into scalp and leave in 5 min then rinse.   May use 3x a week  120 mL  1  . fluticasone (FLONASE) 50 MCG/ACT nasal spray Place 2 sprays into the nose daily as needed. For allergies      . lidocaine (XYLOCAINE) 2 % jelly Apply topically as needed (for dysparunia).  30 mL  12  . metoprolol succinate (TOPROL-XL) 100 MG 24 hr tablet Take 1 tablet (100 mg total) by mouth daily.  90 tablet  3  . NEXIUM 40 MG capsule TAKE 1 CAPSULE (40 MG TOTAL) BY MOUTH AT BEDTIME.  90 capsule  3  . nitrofurantoin, macrocrystal-monohydrate, (MACROBID) 100 MG capsule Take 100 mg by mouth once as needed (post coital).      . ORACEA 40 MG capsule TAKE 1 CAPSULE BY MOUTH ONCE DAILY  90 capsule  3  . tretinoin (RETIN-A)  0.025 % cream Apply 1 application topically at bedtime. Pt uses both strengths 0.025% and 0.05%  45 g  5   No current facility-administered medications on file prior to visit.   No Known Allergies  Family History  Problem Relation Age of Onset  . Lung cancer Mother   . Hypertension Mother   . Hyperlipidemia Mother   . Cancer Father     Esophageal cancer  . Hyperlipidemia Father   . Colon cancer Other   . Sarcoidosis Other   . Testicular cancer Other   pt says her mother had papillary adenocarcinoma of the thyroid.   BP 136/80  Pulse 78  Ht 5\' 8"  (1.727 m)  Wt 178 lb (80.74 kg)  BMI 27.07 kg/m2  SpO2 97%  Review of Systems denies cramps, constipation, blurry vision, myalgias, rhinorrhea, easy bruising, and syncope  She reports menopausal sxs, fatigue, generalized weakness, dry skin, acral numbness, depression, palpitations, difficulty with concentration, weight gain, and doe.    Objective:   Physical Exam VS: see vs page GEN: no distress HEAD: head: no deformity eyes: no periorbital swelling, no proptosis external nose and  ears are normal mouth: no lesion seen NECK: thyroid is slightly enlarged, with an irregular surface.  i am unable to appreciate a nodule.   CHEST WALL: no deformity LUNGS:  Clear to auscultation CV: reg rate and rhythm, no murmur.  ABD: abdomen is soft, nontender.  no hepatosplenomegaly.  not distended.  no hernia.   MUSCULOSKELETAL: muscle bulk and strength are grossly normal.  no obvious joint swelling.  gait is normal and steady EXTEMITIES: no deformity.  no ulcer on the feet.  feet are of normal color and temp.  no edema PULSES: dorsalis pedis intact bilat.  no carotid bruit NEURO:  cn 2-12 grossly intact.   readily moves all 4's.  sensation is intact to touch on the feet SKIN:  Normal texture and temperature.  No rash or suspicious lesion is visible.   NODES:  None palpable at the neck PSYCH: alert, oriented x3.  Does not appear anxious nor depressed.   (pt says she will soon do the labs ordered by dr Laury Axon) (i reviewed thyroid US report)    Assessment & Plan:  Small multinodular goiter, which is usually hereditary.  Euthyroid is the past, but she needs to do the labs ordered for her. Bipolar disorder.  This limits interpretation of sxs. Weight gain, unlikely thyroid-related.

## 2012-10-26 NOTE — Patient Instructions (Addendum)
Your thyroid was slightly abnormal in 2009.  Therefore, you are at higher than average risk for it becoming abnormal again in the future, so you should have it checked at least each year.   You should also have the ultrasound rechecked in 1 year.  i would be happy to do this, or dr Laury Axon could do.

## 2012-10-27 DIAGNOSIS — E042 Nontoxic multinodular goiter: Secondary | ICD-10-CM | POA: Insufficient documentation

## 2012-11-02 ENCOUNTER — Encounter: Payer: Self-pay | Admitting: Neurology

## 2012-11-02 ENCOUNTER — Ambulatory Visit (INDEPENDENT_AMBULATORY_CARE_PROVIDER_SITE_OTHER): Payer: 59 | Admitting: Neurology

## 2012-11-02 VITALS — BP 122/78 | HR 92 | Temp 98.1°F | Resp 20 | Wt 179.0 lb

## 2012-11-02 DIAGNOSIS — R442 Other hallucinations: Secondary | ICD-10-CM

## 2012-11-02 DIAGNOSIS — G251 Drug-induced tremor: Secondary | ICD-10-CM

## 2012-11-02 DIAGNOSIS — G250 Essential tremor: Secondary | ICD-10-CM

## 2012-11-02 DIAGNOSIS — R443 Hallucinations, unspecified: Secondary | ICD-10-CM

## 2012-11-02 MED ORDER — CLONAZEPAM 0.5 MG PO TABS: ORAL_TABLET | ORAL | Status: DC

## 2012-11-02 NOTE — Patient Instructions (Addendum)
1.  Klonopin - 1/2 tablet in the AM - try on a day when you are not working and not driving.  Continue klonopin 1/2 to 1 at night

## 2012-11-02 NOTE — Progress Notes (Signed)
Subjective:   Beverly Cole was seen in consultation in the movement disorder clinic at the request of Yaminah Clayborn, DO.  The evaluation is for tremor.  She has previously seen Dr. Lesia Sago for this.  The patient is a 52 y.o. right handed female with a history of tremor.  She states that she was dx ET and told that she did not need medication.  When she developed high blood pressure, she went on inderal and that helped tremor and BP.  She was later admitted to the hosp for w/u of CP and when she f/u with Dr. Anne Hahn, it was recommended that she have a sleep study.  Dr. Vickey Huger recommended that the patient try primidone.  That increased the patients tremor and she felt that she was sleeping all of the time.  She went off of the primidone, then remembered that VPA caused tremor so she went for a VPA level and it was 112.  She has not changed VPA dosing but relates that she is to have a repeat level in a week. Tremor is worse in the L than the right.  The patient states that the tremor is embarrassing, so she told people she had parkinsons disease to avoid that embarrassment.  Tremor is most bothersome after she eats or if she gets stressed out about something.    She also relates some tremor of the head, most noticeable when she wakes up and feels it in the bed.  It goes away when she gets up.  She relates her mother had similar tremor.    She relates visual hallucinations.  She states that the first happened when asleep.  It was just prior to awakening.  She was swatting at birds in the night and her husband woke her up and asked her what she was doing because she was flinging her pillow around.  She has had several of these other episodes.  All of these arise out of sleep and are never during the day.    11/02/2012 update:  The patient presents today in followup.  I have not seen her since April, 2014.  She does have a history of tremor.  She has has a history of hypnopompic hallucinosis and is on  clonazepam for that.  It worked well but she has only taken 1/4 of tablet because she was running medication.  She is on 1500mg  of VPA at night for bipolar and that has been stable.  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  She has noticed some tremor in the legs as well, which is fairly new for her.  She worries that she has Parkinson's disease.  Current/Previously tried tremor medications: primidone (increased tremor d/t inc VPA levels)  Current medications that may exacerbate tremor:  VPA  Outside reports reviewed: historical medical records.  No Known Allergies  Current Outpatient Prescriptions on File Prior to Visit  Medication Sig Dispense Refill  . buPROPion (WELLBUTRIN XL) 150 MG 24 hr tablet Take 150 mg by mouth at bedtime.       . Cholecalciferol (VITAMIN D3) 1000 UNITS CAPS Take 1,000 Units by mouth at bedtime.       . clonazePAM (KLONOPIN) 0.5 MG tablet 1/2 to 1 tablet at night as directed.  30 tablet  3  . divalproex (DEPAKOTE) 500 MG 24 hr tablet Take 1,500 mg by mouth at bedtime.       Marland Kitchen  Fluocinolone Acetonide 0.01 % SHAM Massage into scalp and leave in 5 min then rinse.   May use 3x a week  120 mL  1  . fluticasone (FLONASE) 50 MCG/ACT nasal spray Place 2 sprays into the nose daily as needed. For allergies      . lidocaine (XYLOCAINE) 2 % jelly Apply topically as needed (for dysparunia).  30 mL  12  . metoprolol succinate (TOPROL-XL) 100 MG 24 hr tablet Take 1 tablet (100 mg total) by mouth daily.  90 tablet  3  . NEXIUM 40 MG capsule TAKE 1 CAPSULE (40 MG TOTAL) BY MOUTH AT BEDTIME.  90 capsule  3  . nitrofurantoin, macrocrystal-monohydrate, (MACROBID) 100 MG capsule Take 100 mg by mouth once as needed (post coital).      . ORACEA 40 MG capsule TAKE 1 CAPSULE BY MOUTH ONCE DAILY  90 capsule  3  . tretinoin (RETIN-A) 0.025 % cream Apply 1 application topically at bedtime. Pt  uses both strengths 0.025% and 0.05%  45 g  5   No current facility-administered medications on file prior to visit.    Past Medical History  Diagnosis Date  . DEPRESSION   . GERD   . HYPERLIPIDEMIA   . MYALGIA   . BIPOLAR DISORDER UNSPECIFIED   . IRRITABLE BOWEL SYNDROME, HX OF   . Hypertension   . Atrial tachycardia   . Hypertension   . Headache(784.0)     usually with cycle  . Bursitis of hip 09/1999    Bilateral - Dr. Luiz Blare  . Shortness of breath     Pulmonary eval 05/2002  . Mitral valve prolapse 12/17/2000    cardiolyte study 12/2000 neg  . Fistula of female genitalia 03/29/2007    enterovesical fistula, not reapired  . Anxiety   . Menopausal state 01/2012    [facility] = 88.5    Past Surgical History  Procedure Laterality Date  . Lipoma (r) side  1990    Right back    History   Social History  . Marital Status: Married    Spouse Name: N/A    Number of Children: 0  . Years of Education: N/A   Occupational History  . 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 Smoking status: Former Smoker -- 1.00 packs/day for 25 years    Types: Cigarettes    Quit date: 02/11/1996  . Smokeless tobacco: Never Used     Comment: Married, lives with spouse. 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 Alcohol Use: No  . Drug Use: No  . Sexual Activity: Yes    Partners: Male    Birth Control/ Protection: Pill   Other Topics Concern  . Not on file   Social History Narrative   Married, lives with spouse in [place]. 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 Mother Deceased     lung CA  . Father Deceased     esophageal CA  . Brother Alive     testicular CA    Review of Systems A complete 10 system ROS was obtained and was negative apart from what is mentioned.   Objective:   VITALS:   There were no vitals filed for this visit. Gen:  Appears stated age and in NAD. HEENT:  Normocephalic, atraumatic.  The mucous membranes are moist. The superficial temporal arteries are without ropiness or tenderness. Cardiovascular: Regular rate  and rhythm. Lungs: Clear to auscultation bilaterally. Neck: There are no carotid bruits noted bilaterally.  NEUROLOGICAL:  Orientation:  The patient is alert and oriented x 3.  Recent and remote memory are intact.  Attention span and concentration are normal.  Able to name objects and repeat without trouble.  Fund of knowledge is appropriate Cranial nerves: There is good facial symmetry. The pupils are equal round and reactive to light bilaterally. Fundoscopic exam reveals clear disc margins bilaterally. Extraocular muscles are intact and visual fields are full to confrontational testing. Speech is fluent and clear. Soft palate rises symmetrically and there is no tongue deviation. Hearing is intact to conversational tone. Tone: Tone is good throughout. Sensation: Sensation is intact to light touch and pinprick throughout (facial, trunk, extremities). Vibration is intact at the bilateral big toe. There is no extinction with double simultaneous stimulation. There is no sensory dermatomal level identified. Coordination:  The patient has no dysdiadichokinesia or dysmetria. Motor: Strength is 5/5 in the bilateral upper and lower extremities.  Shoulder shrug is equal bilaterally.  There is no pronator drift.  There are no fasciculations noted. DTR's: Deep tendon reflexes are 2/4 at the bilateral biceps, triceps, brachioradialis, patella and achilles.  Plantar responses are downgoing bilaterally. Gait and Station: The patient is able to ambulate without difficulty. The patient is able to heel toe walk without any difficulty. The patient is able to ambulate in a tandem fashion. The patient is able to stand in the Romberg position.   MOVEMENT EXAM: Tremor:  There is a mild to moderate postural tremor in the upper extremities.  I did not see a tremor in the lower extremities, but  she reports that she feels it.  She has mild difficulty with Archimedes spirals bilaterally.  LABS:    Chemistry      Component Value Date/Time   NA 140 03/05/2012 1139   K 3.6 03/05/2012 1139   CL 105 03/05/2012 1139   CO2 27 03/05/2012 1139   BUN 8 03/05/2012 1139   CREATININE 0.9 03/05/2012 1139      Component Value Date/Time   CALCIUM 9.1 03/05/2012 1139   ALKPHOS 38* 08/11/2011 1140   AST 18 08/11/2011 1140   ALT 7 08/11/2011 1140   BILITOT 0.2* 08/11/2011 1140     Lab Results  Component Value Date   TSH 2.94 03/05/2012   Lab Results  Component Value Date   WBC 5.9 03/05/2012   HGB 13.8 03/05/2012   HCT 40.9 03/05/2012   MCV 88.9 03/05/2012   PLT 201.0 03/05/2012   Lab Results  Component Value Date   PHENYTOIN <0.5 ug/mL* 11/05/2006   VALPROATE 112.5* 05/07/2012        Assessment/Plan:   1.  Tremor.  -I think that this is multifactorial in nature.  I think that the primary etiology is a tremor caused by Depakote, but she reports that her mother had tremor as well, so this could be essential tremor exacerbated by Depakote.  Nonetheless, the patient would like to try a medication during the daytime.  I think I would like to utilize the clonazepam that she is already on, as long as it does not make her too sleepy.  She is to try and I gave her she is not working and is not driving and just take half tablet in the morning.  If that makes her too sleepy, and she and I talked about the possibility of adding Topamax. she has no history of kidney stones or  glaucoma.  Primidone made her worse as it increased her Depakote levels causing more tremor.  -I reassured the patient that she does not have Parkinson's disease. 2.  Hypnopompic Hallucinosis.  -This is a relatively normal phenomenon, but because she is acting out her dreams in the midst of this creating an REM behavior disorder, then it can be scary and increasing problems.  She would continue the clonazepam.    -Risks, benefits, side  effects and alternative therapies were discussed.  The opportunity to ask questions was given and they were answered to the best of my ability.  The patient expressed understanding and willingness to follow the outlined treatment protocols. 3.  I wanted to get lab work today, but she reports that she is to have that done next week for her primary care physician. 4. I will see her back in January, sooner should new neurologic issues arise.

## 2012-11-09 ENCOUNTER — Other Ambulatory Visit: Payer: 59

## 2012-11-09 ENCOUNTER — Other Ambulatory Visit (INDEPENDENT_AMBULATORY_CARE_PROVIDER_SITE_OTHER): Payer: 59

## 2012-11-09 DIAGNOSIS — R232 Flushing: Secondary | ICD-10-CM

## 2012-11-09 DIAGNOSIS — F319 Bipolar disorder, unspecified: Secondary | ICD-10-CM

## 2012-11-09 DIAGNOSIS — Z0000 Encounter for general adult medical examination without abnormal findings: Secondary | ICD-10-CM

## 2012-11-09 DIAGNOSIS — N951 Menopausal and female climacteric states: Secondary | ICD-10-CM

## 2012-11-09 DIAGNOSIS — I10 Essential (primary) hypertension: Secondary | ICD-10-CM

## 2012-11-09 LAB — HEPATIC FUNCTION PANEL
ALT: 30 U/L (ref 0–35)
AST: 25 U/L (ref 0–37)
Albumin: 3.9 g/dL (ref 3.5–5.2)
Alkaline Phosphatase: 51 U/L (ref 39–117)
Bilirubin, Direct: 0 mg/dL (ref 0.0–0.3)
Total Bilirubin: 0.4 mg/dL (ref 0.3–1.2)
Total Protein: 7.2 g/dL (ref 6.0–8.3)

## 2012-11-09 LAB — CBC WITH DIFFERENTIAL/PLATELET
Basophils Absolute: 0 10*3/uL (ref 0.0–0.1)
Basophils Relative: 0.5 % (ref 0.0–3.0)
Eosinophils Absolute: 0.1 10*3/uL (ref 0.0–0.7)
Eosinophils Relative: 1 % (ref 0.0–5.0)
HCT: 41.4 % (ref 36.0–46.0)
Hemoglobin: 14.1 g/dL (ref 12.0–15.0)
Lymphocytes Relative: 47.7 % — ABNORMAL HIGH (ref 12.0–46.0)
Lymphs Abs: 3.1 10*3/uL (ref 0.7–4.0)
MCHC: 34.1 g/dL (ref 30.0–36.0)
MCV: 89.3 fl (ref 78.0–100.0)
Monocytes Absolute: 0.5 10*3/uL (ref 0.1–1.0)
Monocytes Relative: 7.5 % (ref 3.0–12.0)
Neutro Abs: 2.8 10*3/uL (ref 1.4–7.7)
Neutrophils Relative %: 43.3 % (ref 43.0–77.0)
Platelets: 236 10*3/uL (ref 150.0–400.0)
RBC: 4.64 Mil/uL (ref 3.87–5.11)
RDW: 13.9 % (ref 11.5–14.6)
WBC: 6.6 10*3/uL (ref 4.5–10.5)

## 2012-11-09 LAB — LIPID PANEL
Cholesterol: 251 mg/dL — ABNORMAL HIGH (ref 0–200)
HDL: 43 mg/dL (ref >39.00)
Total CHOL/HDL Ratio: 6
Triglycerides: 254 mg/dL — ABNORMAL HIGH (ref 0.0–149.0)
VLDL: 50.8 mg/dL — ABNORMAL HIGH (ref 0.0–40.0)

## 2012-11-09 LAB — BASIC METABOLIC PANEL
BUN: 13 mg/dL (ref 6–23)
CO2: 25 mEq/L (ref 19–32)
Calcium: 9.2 mg/dL (ref 8.4–10.5)
Chloride: 104 mEq/L (ref 96–112)
Creatinine, Ser: 0.9 mg/dL (ref 0.4–1.2)
GFR: 70.53 mL/min (ref >60.00)
Glucose, Bld: 76 mg/dL (ref 70–99)
Potassium: 3.4 mEq/L — ABNORMAL LOW (ref 3.5–5.1)
Sodium: 138 mEq/L (ref 135–145)

## 2012-11-09 LAB — TSH: TSH: 4.65 u[IU]/mL (ref 0.35–5.50)

## 2012-11-09 LAB — LDL CHOLESTEROL, DIRECT: Direct LDL: 186.6 mg/dL

## 2012-11-09 LAB — T3, FREE: T3, Free: 2.7 pg/mL (ref 2.3–4.2)

## 2012-11-09 LAB — T4, FREE: Free T4: 0.71 ng/dL (ref 0.60–1.60)

## 2012-11-10 LAB — VALPROIC ACID LEVEL: Valproic Acid Lvl: 105.6 ug/mL — ABNORMAL HIGH (ref 50.0–100.0)

## 2012-11-12 ENCOUNTER — Encounter: Payer: Self-pay | Admitting: Neurology

## 2012-11-12 ENCOUNTER — Encounter: Payer: Self-pay | Admitting: Family Medicine

## 2012-11-18 ENCOUNTER — Other Ambulatory Visit: Payer: Self-pay | Admitting: Family Medicine

## 2012-11-24 ENCOUNTER — Encounter: Payer: Self-pay | Admitting: Family Medicine

## 2012-12-16 ENCOUNTER — Telehealth: Payer: Self-pay | Admitting: Family Medicine

## 2012-12-16 NOTE — Telephone Encounter (Signed)
Labs were sent to Dr. Tomasa Rand as directed by provider. I called Dr.McKinney's office and got the fax and labs have been sent to [phone].     KP

## 2012-12-16 NOTE — Telephone Encounter (Signed)
Patient states that we were going to send her completed lab results to Merridith Baker at Dr. P.McKinney's office but they have not received. Patient states that she does not have the fax or phone number on her but we do. Please advise??

## 2012-12-21 ENCOUNTER — Ambulatory Visit (INDEPENDENT_AMBULATORY_CARE_PROVIDER_SITE_OTHER): Payer: 59 | Admitting: Cardiovascular Disease

## 2012-12-21 ENCOUNTER — Encounter: Payer: Self-pay | Admitting: Cardiovascular Disease

## 2012-12-21 VITALS — BP 120/75 | HR 82 | Ht 68.5 in | Wt 175.8 lb

## 2012-12-21 DIAGNOSIS — I340 Nonrheumatic mitral (valve) insufficiency: Secondary | ICD-10-CM

## 2012-12-21 DIAGNOSIS — I498 Other specified cardiac arrhythmias: Secondary | ICD-10-CM

## 2012-12-21 DIAGNOSIS — I471 Supraventricular tachycardia: Secondary | ICD-10-CM

## 2012-12-21 DIAGNOSIS — I059 Rheumatic mitral valve disease, unspecified: Secondary | ICD-10-CM

## 2012-12-21 MED ORDER — DILTIAZEM HCL ER COATED BEADS 180 MG PO CP24: 180.0000 mg | ORAL_CAPSULE | Freq: Every day | ORAL | Status: DC

## 2012-12-21 NOTE — Progress Notes (Signed)
History of Present Illness: 52 yo WF with history of HLD, HTN, Bipolar disorder, IBS, Mitral valve prolapse here today for cardiac follow up. She was seen as a new patient 10/08/11 to establish cardiology care. She was diagnosed with MVP in the 1980s. She told me that she had been having sharp pains in her chest, occasionally associated with SOB. These episodes lasted for a few minutes. She was seen in the ED at [facility] 08/11/11 and had a coronary CT 08/12/11 which showed normal coronary arteries. Also notes skipped heart beats several times per week. I ordered an echo which showed normal LV size and function, mild MR. A 48 hour Holter monitor showed NSR with bradycardia, PACs and runs of SVT with short run of atrial fibrillation. She was seen in EP clinic by Dr. Johney Frame October 2013 and he felt that this was most likely a long RP tachycardia. He did not recommend a change in therapy but suggested Flecainide if symptoms worsened and EP f/u only if there were changes. She was last seen in EP clinic in January 2014.   She is here for follow up. She tells me that she has had no palpitations. She has no chest pain, SOB, near syncope. She has been having hallucinations while sleeping and she thinks this started after her she started the Toprol. She has also been found to have thyroid nodules and workup is in progress.   Primary Care Physician: Jahniya, Duzan  Last Lipid Profile:Lipid Panel     Component Value Date/Time   CHOL 251* 11/09/2012 1059   TRIG 254.0* 11/09/2012 1059   HDL 43.00 11/09/2012 1059   CHOLHDL 6 11/09/2012 1059   VLDL 50.8* 11/09/2012 1059   LDLCALC 110* 04/05/2010 0940     Past Medical History  Diagnosis Date  . DEPRESSION   . GERD   . HYPERLIPIDEMIA   . MYALGIA   . BIPOLAR DISORDER UNSPECIFIED   . IRRITABLE BOWEL SYNDROME, HX OF   . Hypertension   . Atrial tachycardia   . Hypertension   . Headache(784.0)     usually with cycle  . Bursitis of hip 09/1999    Bilateral - Dr.  Luiz Blare  . Shortness of breath     Pulmonary eval 05/2002  . Mitral valve prolapse 12/17/2000    cardiolyte study 12/2000 neg  . Fistula of female genitalia 03/29/2007    enterovesical fistula, not reapired  . Anxiety   . Menopausal state 01/2012    [facility] = 88.5    Past Surgical History  Procedure Laterality Date  . Lipoma (r) side  1990    Right back    Current Outpatient Prescriptions  Medication Sig Dispense Refill  . buPROPion (WELLBUTRIN XL) 150 MG 24 hr tablet Take 150 mg by mouth at bedtime.       . Cholecalciferol (VITAMIN D3) 1000 UNITS CAPS Take 1,000 Units by mouth at bedtime.       . divalproex (DEPAKOTE) 500 MG 24 hr tablet Take 500 mg by mouth at bedtime.       . fluticasone (FLONASE) 50 MCG/ACT nasal spray Place 2 sprays into the nose daily as needed. For allergies      . lidocaine (XYLOCAINE) 2 % jelly Apply topically as needed (for dysparunia).  30 mL  12  . metoprolol succinate (TOPROL-XL) 100 MG 24 hr tablet Take 1 tablet (100 mg total) by mouth daily.  90 tablet  3  . NEXIUM 40 MG capsule TAKE 1 CAPSULE (  40 MG TOTAL) BY MOUTH AT BEDTIME.  90 capsule  3  . nitrofurantoin, macrocrystal-monohydrate, (MACROBID) 100 MG capsule Take 100 mg by mouth once as needed (post coital).      . ORACEA 40 MG capsule TAKE 1 CAPSULE BY MOUTH ONCE DAILY  90 capsule  3   No current facility-administered medications for this visit.    No Known Allergies  History   Social History  . Marital Status: Married    Spouse Name: N/A    Number of Children: 0  . Years of Education: N/A   Occupational History  . 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 Smoking status: Former Smoker -- 1.00 packs/day for 25 years    Types: Cigarettes    Quit date: 02/11/1996  . Smokeless tobacco: Never Used     Comment: Married, lives with spouse. 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 Alcohol Use: No  . Drug Use: No  . Sexual Activity: Yes    Partners: Male    Birth Control/  Protection: Pill   Other Topics Concern  . Not on file   Social History Narrative   Married, lives with spouse in [place]. 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 Lung cancer Mother   . Hypertension Mother   . Hyperlipidemia Mother   . Cancer Father     Esophageal cancer  . Hyperlipidemia Father   . Colon cancer Other   . Sarcoidosis Other   . Testicular cancer Other     Review of Systems:  As stated in the HPI and otherwise negative.   BP 120/75  Pulse 82  Ht 5' 8.5" (1.74 m)  Wt 175 lb 12.8 oz (79.742 kg)  BMI 26.34 kg/m2  Physical Examination: General: Well developed, well nourished, NAD HEENT: OP clear, mucus membranes moist SKIN: warm, dry. No rashes. Neuro: No focal deficits Musculoskeletal: Muscle strength 5/5 all ext Psychiatric: Mood and affect normal Neck: No JVD, no carotid bruits, no thyromegaly, no lymphadenopathy. Lungs:Clear bilaterally, no wheezes, rhonci, crackles Cardiovascular: Regular rate and rhythm. No murmurs, gallops or rubs. Abdomen:Soft. Bowel sounds present. Non-tender.  Extremities: No lower extremity edema. Pulses are 2 + in the bilateral DP/PT.  EKG: NSR, rate 71 bpm.   Assessment and Plan:   1. SVT: She is known to have a long RP tachycardia. She has done quite well over the last year with Toprol therapy. She thinks this is causing her hallucinations. Will start Cardizem 180 mg po Qdaily. If she has worsening of symptoms, would start Flecainide and arrange treadmill but she does not wish to do this at this time. Will see her back in 6 months.   2. Mitral regurgitation: mild by echo September 2013.

## 2012-12-21 NOTE — Patient Instructions (Signed)
Your physician wants you to follow-up in:  6 months.  You will receive a reminder letter in the mail two months in advance. If you don't receive a letter, please call our office to schedule the follow-up appointment.   Your physician has recommended you make the following change in your medication: Stop Toprol. Start Cardizem CD 180 mg by mouth daily

## 2013-01-31 ENCOUNTER — Ambulatory Visit (INDEPENDENT_AMBULATORY_CARE_PROVIDER_SITE_OTHER): Payer: 59 | Admitting: Endocrinology

## 2013-01-31 ENCOUNTER — Encounter: Payer: Self-pay | Admitting: Endocrinology

## 2013-01-31 VITALS — BP 138/84 | HR 93 | Temp 98.4°F | Resp 12 | Ht 68.75 in | Wt 170.9 lb

## 2013-01-31 DIAGNOSIS — R1310 Dysphagia, unspecified: Secondary | ICD-10-CM

## 2013-01-31 DIAGNOSIS — E042 Nontoxic multinodular goiter: Secondary | ICD-10-CM

## 2013-01-31 DIAGNOSIS — E038 Other specified hypothyroidism: Secondary | ICD-10-CM

## 2013-01-31 DIAGNOSIS — E039 Hypothyroidism, unspecified: Secondary | ICD-10-CM

## 2013-01-31 NOTE — Progress Notes (Signed)
Patient ID: Beverly Cole, female   DOB: 03-07-1960, 52 y.o.   MRN: [id number]   Reason for Appointment: "Urgent thyroid problems"   History of Present Illness:    The patient had been seen 3 months ago for evaluation of a goiter and was felt not to have any significant thyroid enlargement The patient is coming today with with multiple symptoms:  Difficulty swallowing. This has been going on for at least a month and worse lately. She has difficulty getting her food down her throat and sometimes has to cough it back up. She can swallow liquids without difficulty.  She has a feeling of soreness in her throat  She does not feel like that her breath is being cut off or choked but at times has to take a long deep breath. Only once she had difficulty catching her breath when she was coughing at night  She thinks she has been having problems with excessive crying, speech being slightly slurred and slow, dry skin, chronic hair loss, relatively new cord intolerance  She sometimes will feel shaky   The patient's thyroid enlargement was first discovered in 8/14 and she was evaluated with an ultrasound at that time which showed only subcentimeter cystic nodules. Measurement of the thyroid showed the left lobe to be 6 cm and the right 4.8 cm  She also has had thyroid functions evaluated for the last 4 years with variable TSH levels, however these have been frequently high normal Thyroid antibodies are not available  Lab Results  Component Value Date   TSH 4.65 11/09/2012      Medication List       This list is accurate as of: 01/31/13  4:39 PM.  Always use your most recent med list.               buPROPion 150 MG 24 hr tablet  Commonly known as:  WELLBUTRIN XL  Take 150 mg by mouth at bedtime.     diltiazem 180 MG 24 hr capsule  Commonly known as:  CARDIZEM CD  Take 1 capsule (180 mg total) by mouth daily.     divalproex 500 MG 24 hr tablet  Commonly known as:  DEPAKOTE ER  Take  500 mg by mouth at bedtime.     fluticasone 50 MCG/ACT nasal spray  Commonly known as:  FLONASE  Place 2 sprays into the nose daily as needed. For allergies     lidocaine 2 % jelly  Commonly known as:  XYLOCAINE  Apply topically as needed (for dysparunia).     NEXIUM 40 MG capsule  Generic drug:  esomeprazole  TAKE 1 CAPSULE (40 MG TOTAL) BY MOUTH AT BEDTIME.     nitrofurantoin (macrocrystal-monohydrate) 100 MG capsule  Commonly known as:  MACROBID  Take 100 mg by mouth once as needed (post coital).     ORACEA 40 MG capsule  Generic drug:  doxycycline  TAKE 1 CAPSULE BY MOUTH ONCE DAILY     Vitamin D3 1000 UNITS Caps  Take 1,000 Units by mouth at bedtime.        Allergies: No Known Allergies  Past Medical History  Diagnosis Date  . DEPRESSION   . GERD   . HYPERLIPIDEMIA   . MYALGIA   . BIPOLAR DISORDER UNSPECIFIED   . IRRITABLE BOWEL SYNDROME, HX OF   . Hypertension   . Atrial tachycardia   . Hypertension   . Headache(784.0)     usually with cycle  . Bursitis of  hip 09/1999    Bilateral - Dr. Luiz Blare  . Shortness of breath     Pulmonary eval 05/2002  . Mitral valve prolapse 12/17/2000    cardiolyte study 12/2000 neg  . Fistula of female genitalia 03/29/2007    enterovesical fistula, not reapired  . Anxiety   . Menopausal state 01/2012    [facility] = 88.5    Past Surgical History  Procedure Laterality Date  . Lipoma (r) side  1990    Right back    Family History  Problem Relation Age of Onset  . Lung cancer Mother   . Hypertension Mother   . Hyperlipidemia Mother   . Cancer Father     Esophageal cancer  . Hyperlipidemia Father   . Colon cancer Other   . Sarcoidosis Other   . Testicular cancer Other     Social History:  reports that she quit smoking about 16 years ago. Her smoking use included Cigarettes. She has a 25 pack-year smoking history. She has never used smokeless tobacco. She reports that she does not drink alcohol or use illicit  drugs.   Review of Systems:  CARDIOLOGY:  not on treatment for high blood pressure.            Has history of reflux  ENDOCRINOLOGY:  no history of Diabetes.             Examination:   BP 138/84  Pulse 93  Temp(Src) 98.4 F (36.9 C)  Resp 12  Ht 5' 8.75" (1.746 m)  Wt 170 lb 14.4 oz (77.52 kg)  BMI 25.43 kg/m2  SpO2 98%   General Appearance: pleasant, averagely built and nourished         Eyes: No abnormal prominence or eyelid swelling.          Neck: The thyroid is enlarged about 1-1/2 times on the right, smooth, soft, not nodular. Left side is not palpable either anteriorly or on supine position There is no stridor. Pemberton sign is negative There is no lymphadenopathy .    Cardiovascular: Normal  heart sounds, no murmur Respiratory:  Lungs clear Neurological: REFLEXES: at biceps are normal.        Assessment/Plan:  Small goiter clinically and on ultrasound Borderline thyroid levels have been present in the past and she may be symptomatic from mild hypothyroidism Depression, probably not controlled with history of crying spells recently Dysphagia likely to be related to esophageal causes, either dysmotility or obstruction and needs to be evaluated by her PCP or gastroenterologist Reassured her that her dysphagia is not from goiter which is very small; she does not need any surgical procedures on this Also discussed that her cystic thyroid nodules are very small and probably incidental; unlikely to be a problem in the future Recommended followup with her PCP soon  Recommended she have thyroid functions and antithyroid peroxidase antibody recheck. If TSH is over 4.0 will consider 25 mcg of Synthroid or 30 mg Armour thyroid empirically. She will followup with PCP to have her labs drawn and forwarded to me  [facility] 01/31/2013

## 2013-03-01 ENCOUNTER — Ambulatory Visit: Payer: 59 | Admitting: Neurology

## 2013-03-07 ENCOUNTER — Telehealth: Payer: Self-pay | Admitting: *Deleted

## 2013-03-07 NOTE — Telephone Encounter (Signed)
Patient called and stated that it was time to have her blood work done. Please advise.

## 2013-03-07 NOTE — Telephone Encounter (Signed)
She is due for a 6 mo follow up. Please scheduled fasting.     KP

## 2013-03-08 ENCOUNTER — Other Ambulatory Visit: Payer: 59

## 2013-03-18 ENCOUNTER — Ambulatory Visit (INDEPENDENT_AMBULATORY_CARE_PROVIDER_SITE_OTHER): Payer: 59 | Admitting: Family Medicine

## 2013-03-18 ENCOUNTER — Encounter: Payer: Self-pay | Admitting: Family Medicine

## 2013-03-18 VITALS — BP 132/80 | HR 72 | Temp 97.5°F | Wt 175.0 lb

## 2013-03-18 DIAGNOSIS — N390 Urinary tract infection, site not specified: Secondary | ICD-10-CM

## 2013-03-18 DIAGNOSIS — E785 Hyperlipidemia, unspecified: Secondary | ICD-10-CM

## 2013-03-18 DIAGNOSIS — E038 Other specified hypothyroidism: Secondary | ICD-10-CM

## 2013-03-18 DIAGNOSIS — R1310 Dysphagia, unspecified: Secondary | ICD-10-CM

## 2013-03-18 DIAGNOSIS — Z79899 Other long term (current) drug therapy: Secondary | ICD-10-CM

## 2013-03-18 DIAGNOSIS — E039 Hypothyroidism, unspecified: Secondary | ICD-10-CM

## 2013-03-18 DIAGNOSIS — I10 Essential (primary) hypertension: Secondary | ICD-10-CM

## 2013-03-18 DIAGNOSIS — E042 Nontoxic multinodular goiter: Secondary | ICD-10-CM

## 2013-03-18 LAB — POCT URINALYSIS DIPSTICK
Bilirubin, UA: NEGATIVE
Blood, UA: NEGATIVE
Glucose, UA: NEGATIVE
Ketones, UA: NEGATIVE
Nitrite, UA: NEGATIVE
Protein, UA: NEGATIVE
Spec Grav, UA: <=1.005
Urobilinogen, UA: 0.2
pH, UA: 6.5

## 2013-03-18 NOTE — Addendum Note (Signed)
Addended by: Modena Morrow D on: 03/18/2013 03:12 PM   Modules accepted: Orders

## 2013-03-18 NOTE — Patient Instructions (Signed)

## 2013-03-18 NOTE — Progress Notes (Signed)
Pre visit review using our clinic review tool, if applicable. No additional management support is needed unless otherwise documented below in the visit note. 

## 2013-03-18 NOTE — Progress Notes (Signed)
  Subjective:    Patient here for follow-up of elevated blood pressure.  She is exercising and is adherent to a low-salt diet.  Blood pressure is well controlled at home. Cardiac symptoms: palpitations.-on occassion Patient denies: chest pain, chest pressure/discomfort, claudication, dyspnea, exertional chest pressure/discomfort, fatigue, irregular heart beat, lower extremity edema, near-syncope, orthopnea, paroxysmal nocturnal dyspnea, syncope and tachypnea. Cardiovascular risk factors: dyslipidemia and hypertension. Use of agents associated with hypertension: none. History of target organ damage: none.  The following portions of the patient's history were reviewed and updated as appropriate: allergies, current medications, past family history, past medical history, past social history, past surgical history and problem list.  Review of Systems Pertinent items are noted in HPI.     Objective:    BP 132/80  Pulse 72  Temp(Src) 97.5 F (36.4 C) (Oral)  Wt 175 lb (79.379 kg)  SpO2 99% General appearance: alert, cooperative, appears stated age and no distress Throat: lips, mucosa, and tongue normal; teeth and gums normal Neck: no adenopathy, supple, symmetrical, trachea midline and thyroid not enlarged, symmetric, no tenderness/mass/nodules Lungs: clear to auscultation bilaterally Heart: S1, S2 normal Extremities: extremities normal, atraumatic, no cyanosis or edema    Assessment:    Hypertension, normal blood pressure . Evidence of target organ damage: none.    Plan:    Medication: no change. Dietary sodium restriction. Regular aerobic exercise. Check blood pressures 2-3 times weekly and record. Follow up: 6 months and as needed.   1. Medication dosing interval changed Check level and fax to Dr Caprice Beaver - Valproic Acid level  2. Other and unspecified hyperlipidemia Check labs - Basic metabolic panel - Hepatic function panel - Lipid panel  3. Dysphagia,  unspecified(787.20) Pt thought it may be cardizem.   - Ambulatory referral to Gastroenterology  4. HTN (hypertension) Stable -- called  - POCT urinalysis dipstick  5. Subclinical hypothyroidism Check labs--- endo  - TSH - Thyroid peroxidase antibody  6. Multinodular goiter Order labs per endo - Thyroid peroxidase antibody

## 2013-03-19 LAB — VALPROIC ACID LEVEL: Valproic Acid Lvl: 51 ug/mL (ref 50.0–100.0)

## 2013-03-20 LAB — URINE CULTURE

## 2013-03-21 ENCOUNTER — Encounter: Payer: Self-pay | Admitting: Gastroenterology

## 2013-03-21 ENCOUNTER — Telehealth: Payer: Self-pay | Admitting: Family Medicine

## 2013-03-21 LAB — LIPID PANEL
Cholesterol: 225 mg/dL — ABNORMAL HIGH (ref 0–200)
HDL: 46.7 mg/dL (ref >39.00)
Total CHOL/HDL Ratio: 5
Triglycerides: 175 mg/dL — ABNORMAL HIGH (ref 0.0–149.0)
VLDL: 35 mg/dL (ref 0.0–40.0)

## 2013-03-21 LAB — BASIC METABOLIC PANEL
BUN: 10 mg/dL (ref 6–23)
CO2: 25 mEq/L (ref 19–32)
Calcium: 9 mg/dL (ref 8.4–10.5)
Chloride: 101 mEq/L (ref 96–112)
Creatinine, Ser: 0.8 mg/dL (ref 0.4–1.2)
GFR: 80.81 mL/min (ref >60.00)
Glucose, Bld: 72 mg/dL (ref 70–99)
Potassium: 3.6 mEq/L (ref 3.5–5.1)
Sodium: 135 mEq/L (ref 135–145)

## 2013-03-21 LAB — LDL CHOLESTEROL, DIRECT: Direct LDL: 166.5 mg/dL

## 2013-03-21 LAB — HEPATIC FUNCTION PANEL
ALT: 19 U/L (ref 0–35)
AST: 19 U/L (ref 0–37)
Albumin: 4.2 g/dL (ref 3.5–5.2)
Alkaline Phosphatase: 59 U/L (ref 39–117)
Bilirubin, Direct: 0 mg/dL (ref 0.0–0.3)
Total Bilirubin: 0.7 mg/dL (ref 0.3–1.2)
Total Protein: 7.3 g/dL (ref 6.0–8.3)

## 2013-03-21 LAB — TSH: TSH: 2.13 u[IU]/mL (ref 0.35–5.50)

## 2013-03-21 LAB — THYROID PEROXIDASE ANTIBODY: Thyroperoxidase Ab SerPl-aCnc: <10 IU/mL (ref <35.0)

## 2013-03-21 NOTE — Telephone Encounter (Signed)
Relevant patient education assigned to patient using Emmi. ° °

## 2013-04-01 ENCOUNTER — Encounter: Payer: Self-pay | Admitting: Gastroenterology

## 2013-04-01 ENCOUNTER — Ambulatory Visit (INDEPENDENT_AMBULATORY_CARE_PROVIDER_SITE_OTHER): Payer: 59 | Admitting: Gastroenterology

## 2013-04-01 VITALS — BP 120/70 | HR 76 | Ht 69.0 in | Wt 166.4 lb

## 2013-04-01 DIAGNOSIS — F458 Other somatoform disorders: Secondary | ICD-10-CM

## 2013-04-01 DIAGNOSIS — Z800 Family history of malignant neoplasm of digestive organs: Secondary | ICD-10-CM

## 2013-04-01 DIAGNOSIS — K5900 Constipation, unspecified: Secondary | ICD-10-CM

## 2013-04-01 DIAGNOSIS — R0989 Other specified symptoms and signs involving the circulatory and respiratory systems: Secondary | ICD-10-CM

## 2013-04-01 DIAGNOSIS — R198 Other specified symptoms and signs involving the digestive system and abdomen: Secondary | ICD-10-CM

## 2013-04-01 DIAGNOSIS — K219 Gastro-esophageal reflux disease without esophagitis: Secondary | ICD-10-CM

## 2013-04-01 MED ORDER — POLYETHYLENE GLYCOL 3350 17 GM/SCOOP PO POWD
17.0000 g | Freq: Every day | ORAL | Status: DC
Start: 2013-04-01 — End: 2014-06-01

## 2013-04-01 NOTE — Progress Notes (Signed)
    History of Present Illness: This is a 53 year old female who I previously evaluated in 2002. She underwent endoscopy and colonoscopy-both were normal. Diagnoses of IBS and GERD at that time. She had symptoms of dysphagia and underwent an empiric dilation in 2002. She changed gastroenterologists and underwent colonoscopy by Dr.  Collene Mares in 2007 that was normal. She now is returning to see me. She states she has maintained the use of Nexium for many years with very good control of her reflux symptoms. She was recently started on diltiazem and immediately noticed a sensation of a lump in her throat that is an ongoing problem. She has no true solid or liquid dysphagia. She complains of worsening constipation since starting diltiazem as well. Denies weight loss, abdominal pain, diarrhea, change in stool caliber, melena, hematochezia, nausea, vomiting, dysphagia, reflux symptoms, chest pain.  Review of Systems: Pertinent positive and negative review of systems were noted in the above HPI section. All other review of systems were otherwise negative.  Current Medications, Allergies, Past Medical History, Past Surgical History, Family History and Social History were reviewed in [employer] record.  Physical Exam: General: Well developed , well nourished, no acute distress Head: Normocephalic and atraumatic Eyes:  sclerae anicteric, EOMI Ears: Normal auditory acuity Mouth: No deformity or lesions Neck: Supple, no masses or thyromegaly Lungs: Clear throughout to auscultation Heart: Regular rate and rhythm; no murmurs, rubs or bruits Abdomen: Soft, non tender and non distended. No masses, hepatosplenomegaly or hernias noted. Normal Bowel sounds Musculoskeletal: Symmetrical with no gross deformities  Skin: No lesions on visible extremities Pulses:  Normal pulses noted Extremities: No clubbing, cyanosis, edema or deformities noted Neurological: Alert oriented x 4, grossly  nonfocal Cervical Nodes:  No significant cervical adenopathy Inguinal Nodes: No significant inguinal adenopathy Psychological:  Alert and cooperative. Normal mood and affect  Assessment and Recommendations:  1. Globus sensation. Onset with starting diltiazem. Schedule barium esophagram with tablet to rule out esophageal disorders. I encouraged her to contact the physician who prescribed diltiazem to consider changing medications. She did not seem inclined to do that and she states globus really doesn't bother her that much.  2. Family history of colon cancer in a paternal aunt and paternal uncle. She was recommended to have a five-year colonoscopy and this was due in 2012. She states she did not want to have a colonoscopy as she is concerned about the prep. I offered and explained Prepopik however she still declines colonoscopy.  3. Constipation/IBS. Increase dietary fiber and daily water intake. Begin Miralax daily-patient requests a prescription and this can be refilled by her PCP.  4. GERD. Continue Nexium 40 mg daily and standard antireflux measures.

## 2013-04-01 NOTE — Patient Instructions (Signed)
We have sent the following medications to your pharmacy for you to pick up at your convenience: Miralax.   You have been scheduled for a Barium Esophogram at [facility] (1st floor of the hospital) on 04/05/13 at 11:00am. Please arrive 15 minutes prior to your appointment for registration. Make certain not to have anything to eat or drink 6 hours prior to your test. If you need to reschedule for any reason, please contact radiology at [phone] to do so. __________________________________________________________________ A barium swallow is an examination that concentrates on views of the esophagus. This tends to be a double contrast exam (barium and two liquids which, when combined, create a gas to distend the wall of the oesophagus) or single contrast (non-ionic iodine based). The study is usually tailored to your symptoms so a good history is essential. Attention is paid during the study to the form, structure and configuration of the esophagus, looking for functional disorders (such as aspiration, dysphagia, achalasia, motility and reflux) EXAMINATION You may be asked to change into a gown, depending on the type of swallow being performed. A radiologist and radiographer will perform the procedure. The radiologist will advise you of the type of contrast selected for your procedure and direct you during the exam. You will be asked to stand, sit or lie in several different positions and to hold a small amount of fluid in your mouth before being asked to swallow while the imaging is performed .In some instances you may be asked to swallow barium coated marshmallows to assess the motility of a solid food bolus. The exam can be recorded as a digital or video fluoroscopy procedure. POST PROCEDURE It will take 1-2 days for the barium to pass through your system. To facilitate this, it is important, unless otherwise directed, to increase your fluids for the next 24-48hrs and to resume your normal  diet.  This test typically takes about 30 minutes to perform. __________________________________________________________________________________  Thank you for choosing me and [facility].  Pricilla Riffle. Dagoberto Ligas., MD., Marval Regal

## 2013-04-05 ENCOUNTER — Ambulatory Visit (HOSPITAL_COMMUNITY)
Admission: RE | Admit: 2013-04-05 | Discharge: 2013-04-05 | Disposition: A | Discharge status: Home / Self Care | Payer: 59 | Source: Ambulatory Visit | Attending: Gastroenterology | Admitting: Gastroenterology

## 2013-04-05 ENCOUNTER — Ambulatory Visit (INDEPENDENT_AMBULATORY_CARE_PROVIDER_SITE_OTHER): Payer: 59

## 2013-04-05 DIAGNOSIS — R198 Other specified symptoms and signs involving the digestive system and abdomen: Secondary | ICD-10-CM

## 2013-04-05 DIAGNOSIS — R1310 Dysphagia, unspecified: Secondary | ICD-10-CM | POA: Insufficient documentation

## 2013-04-05 DIAGNOSIS — R0989 Other specified symptoms and signs involving the circulatory and respiratory systems: Secondary | ICD-10-CM

## 2013-04-05 DIAGNOSIS — Z111 Encounter for screening for respiratory tuberculosis: Secondary | ICD-10-CM

## 2013-04-05 DIAGNOSIS — K219 Gastro-esophageal reflux disease without esophagitis: Secondary | ICD-10-CM

## 2013-04-08 LAB — TB SKIN TEST
Induration: 0 mm
TB Skin Test: NEGATIVE

## 2013-05-24 ENCOUNTER — Encounter: Payer: Self-pay | Admitting: Nurse Practitioner

## 2013-05-24 ENCOUNTER — Ambulatory Visit (INDEPENDENT_AMBULATORY_CARE_PROVIDER_SITE_OTHER): Payer: 59 | Admitting: Nurse Practitioner

## 2013-05-24 VITALS — BP 120/76 | HR 80 | Ht 69.0 in | Wt 166.0 lb

## 2013-05-24 DIAGNOSIS — Z01419 Encounter for gynecological examination (general) (routine) without abnormal findings: Secondary | ICD-10-CM

## 2013-05-24 DIAGNOSIS — Z79890 Hormone replacement therapy (postmenopausal): Secondary | ICD-10-CM

## 2013-05-24 MED ORDER — NITROFURANTOIN MONOHYD MACRO 100 MG PO CAPS: 100.0000 mg | ORAL_CAPSULE | Freq: Once | ORAL | Status: DC | PRN

## 2013-05-24 MED ORDER — ESTRADIOL 0.5 MG PO TABS: 0.5000 mg | ORAL_TABLET | Freq: Every day | ORAL | Status: DC

## 2013-05-24 MED ORDER — NONFORMULARY OR COMPOUNDED ITEM: Status: DC

## 2013-05-24 MED ORDER — MEDROXYPROGESTERONE ACETATE 5 MG PO TABS: 5.0000 mg | ORAL_TABLET | Freq: Every day | ORAL | Status: DC

## 2013-05-24 NOTE — Progress Notes (Signed)
Encounter reviewed by Dr. Destenee Guerry Silva.  

## 2013-05-24 NOTE — Progress Notes (Signed)
Patient ID: Beverly Cole, female   DOB: 1960-07-21, 53 y.o.   MRN: [id number] 53 y.o. G0P0 Married Caucasian Fe here for annual exam.  Wants to start on HRT.  Her LMP was last April.  Initially did well, then increased vaso symptoms and not able to sleep well.  She quit her job about 3 weeks ago. She feels more agitated and jittery feeling.  She has no risk factors for DVT, she is a nonsmoker.  HTN is well controlled.  No [place] of breast cancer.  Patient's last menstrual period was 05/11/2012.          Sexually active: yes  The current method of family planning is post menopausal status.    Exercising: yes  Home exercise routine includes elliptical machine.  Just started this week.  Trying to do better.. Smoker:  no  Health Maintenance: Pap:  05/13/11, WNL, neg HR HPV MMG:  08/19/12, Bi-Rads 1: negative Colonoscopy:  2007, repeat in 10 years BMD:   never TDaP:  05/13/11 Labs: PCP   reports that she quit smoking about 17 years ago. Her smoking use included Cigarettes. She has a 25 pack-year smoking history. She has never used smokeless tobacco. She reports that she drinks about 1.5 ounces of alcohol per week. She reports that she does not use illicit drugs.  Past Medical History  Diagnosis Date  . DEPRESSION   . GERD   . HYPERLIPIDEMIA   . MYALGIA   . BIPOLAR DISORDER UNSPECIFIED   . IRRITABLE BOWEL SYNDROME, HX OF   . Hypertension   . Atrial tachycardia   . Hypertension   . Headache(784.0)     usually with cycle  . Bursitis of hip 09/1999    Bilateral - Dr. Berenice Primas  . Shortness of breath     Pulmonary eval 05/2002  . Mitral valve prolapse 12/17/2000    cardiolyte study 12/2000 neg  . Fistula of female genitalia 03/29/2007    enterovesical fistula, not reapired  . Anxiety   . Menopausal state 01/2012    FSH = 88.5  . Thyroid nodule 2014    Past Surgical History  Procedure Laterality Date  . Lipoma (r) side  1990    Right back    Current Outpatient Prescriptions  Medication Sig  Dispense Refill  . buPROPion (WELLBUTRIN XL) 150 MG 24 hr tablet Take 150 mg by mouth at bedtime.       . Cholecalciferol (VITAMIN D3) 1000 UNITS CAPS Take 1,000 Units by mouth at bedtime.       Marland Kitchen diltiazem (CARDIZEM CD) 180 MG 24 hr capsule Take 1 capsule (180 mg total) by mouth daily.  90 capsule  3  . divalproex (DEPAKOTE) 500 MG 24 hr tablet Take 500 mg by mouth at bedtime.       Marland Kitchen doxycycline (VIBRAMYCIN) 100 MG capsule Take 100 mg by mouth 2 (two) times daily.      . fluticasone (FLONASE) 50 MCG/ACT nasal spray Place 2 sprays into the nose daily as needed. For allergies      . lidocaine (XYLOCAINE) 2 % jelly Apply topically as needed (for dysparunia).  30 mL  12  . NEXIUM 40 MG capsule TAKE 1 CAPSULE (40 MG TOTAL) BY MOUTH AT BEDTIME.  90 capsule  3  . nitrofurantoin, macrocrystal-monohydrate, (MACROBID) 100 MG capsule Take 100 mg by mouth once as needed (post coital).      . polyethylene glycol powder (GLYCOLAX/MIRALAX) powder Take 17 g by mouth daily.  [place]  g  11  . estradiol (ESTRACE) 0.5 MG tablet Take 1 tablet (0.5 mg total) by mouth daily.  90 tablet  2  . medroxyPROGESTERone (PROVERA) 5 MG tablet Take 1 tablet (5 mg total) by mouth daily.  90 tablet  2  . NONFORMULARY OR COMPOUNDED ITEM Use twice weekly  60 each  2   No current facility-administered medications for this visit.    Family History  Problem Relation Age of Onset  . Lung cancer Mother   . Hypertension Mother   . Hyperlipidemia Mother   . Cancer Father     Esophageal cancer  . Hyperlipidemia Father   . Colon cancer Paternal Uncle   . Sarcoidosis Other   . Testicular cancer Other     ROS:  Pertinent items are noted in HPI.  Otherwise, a comprehensive ROS was negative.  Exam:   BP 120/76  Pulse 80  Ht 5\' 9"  (1.753 m)  Wt 166 lb (75.297 kg)  BMI 24.50 kg/m2  LMP 05/11/2012 Height: 5\' 9"  (175.3 cm)  Ht Readings from Last 3 Encounters:  05/24/13 5\' 9"  (1.753 m)  04/01/13 5\' 9"  (1.753 m)  01/31/13 5' 8.75"  (1.746 m)    General appearance: alert, cooperative and appears stated age Head: Normocephalic, without obvious abnormality, atraumatic Neck: no adenopathy, supple, symmetrical, trachea midline and thyroid normal to inspection and palpation Lungs: clear to auscultation bilaterally Breasts: normal appearance, no masses or tenderness Heart: regular rate and rhythm Abdomen: soft, non-tender; no masses,  no organomegaly Extremities: extremities normal, atraumatic, no cyanosis or edema Skin: Skin color, texture, turgor normal. No rashes or lesions Lymph nodes: Cervical, supraclavicular, and axillary nodes normal. No abnormal inguinal nodes palpated Neurologic: Grossly normal   Pelvic: External genitalia:  no lesions              Urethra:  normal appearing urethra with no masses, tenderness or lesions              Bartholin's and Skene's: normal                 Vagina: normal appearing vagina with normal color and discharge, no lesions              Cervix: anteverted              Pap taken: no Bimanual Exam:  Uterus:  normal size, contour, position, consistency, mobility, non-tender              Adnexa: no mass, fullness, tenderness               Rectovaginal: Confirms               Anus:  normal sphincter tone, no lesions  A:  Well Woman with normal exam  Postmenopausal with significant vaso symptoms  Initiation of HRT  HTN, history of small thyroid nodule  Situational depression with episodes of anxiety  P:   Pap smear as per guidelines Not done  Mammogram is due in July  Refill on Testosterone ointment twice weekly  Will start on Estradiol 0.5 mg daily for 6 months then re-evaluate  Also given Provera 5 mg daily for 6 months.  She is given instructions for taking med's and compliance.  Events of BTB should be minimal if any.  Counseled with potential side effects and risk including DVT, CVA, cancer, etc.  She wants to start HRT at this time.  Counseled on breast self exam,  mammography screening, use and  side effects of HRT, menopause, adequate intake of calcium and vitamin D, diet and exercise, Kegel's exercises return annually or prn  An After Visit Summary was printed and given to the patient.  Consult time for HRT was 15 additional minutes in face to face counseling.

## 2013-05-24 NOTE — Patient Instructions (Signed)

## 2013-06-21 ENCOUNTER — Ambulatory Visit (INDEPENDENT_AMBULATORY_CARE_PROVIDER_SITE_OTHER): Payer: 59 | Admitting: Cardiovascular Disease

## 2013-06-21 ENCOUNTER — Encounter: Payer: Self-pay | Admitting: Cardiovascular Disease

## 2013-06-21 VITALS — BP 108/72 | HR 76 | Ht 69.0 in | Wt 167.0 lb

## 2013-06-21 DIAGNOSIS — I059 Rheumatic mitral valve disease, unspecified: Secondary | ICD-10-CM

## 2013-06-21 DIAGNOSIS — I340 Nonrheumatic mitral (valve) insufficiency: Secondary | ICD-10-CM

## 2013-06-21 DIAGNOSIS — I498 Other specified cardiac arrhythmias: Secondary | ICD-10-CM

## 2013-06-21 DIAGNOSIS — I471 Supraventricular tachycardia: Secondary | ICD-10-CM

## 2013-06-21 NOTE — Patient Instructions (Addendum)
Your physician wants you to follow-up in:  12 months.  You will receive a reminder letter in the mail two months in advance. If you don't receive a letter, please call our office to schedule the follow-up appointment.   

## 2013-06-21 NOTE — Progress Notes (Signed)
History of Present Illness: 53 yo WF with history of HLD, HTN, Bipolar disorder, IBS, Mitral valve prolapse here today for cardiac follow up. She was seen as a new patient 10/08/11 to establish cardiology care. She was diagnosed with MVP in the 1980s. She told me that she had been having sharp pains in her chest, occasionally associated with SOB. These episodes lasted for a few minutes. She was seen in the ED at [facility] 08/11/11 and had a coronary CT 08/12/11 which showed normal coronary arteries. Also notes skipped heart beats several times per week. I ordered an echo which showed normal LV size and function, mild MR. A 48 hour Holter monitor showed NSR with bradycardia, PACs and runs of SVT with short run of atrial fibrillation. She was seen in EP clinic by Dr. Rayann Heman October 2013 and he felt that this was most likely a long RP tachycardia. He did not recommend a change in therapy but suggested Flecainide if symptoms worsened and EP f/u only if there were changes. She was last seen in EP clinic in January 2014. She felt that her Toprol was causing hallucinations so she was started on Cardizem and Toprol stopped November 2014.   She is here for follow up. She quit her job. Her stress level is much lower. She tells me that she has had no palpitations. She has no chest pain, SOB, near syncope.    Primary Care Physician: Teruko, [place]  Last Lipid Profile:Lipid Panel     Component Value Date/Time   CHOL 225* 03/18/2013 1421   TRIG 175.0* 03/18/2013 1421   HDL 46.70 03/18/2013 1421   CHOLHDL 5 03/18/2013 1421   VLDL 35.0 03/18/2013 1421   LDLCALC 110* 04/05/2010 0940     Past Medical History  Diagnosis Date  . DEPRESSION   . GERD   . HYPERLIPIDEMIA   . MYALGIA   . BIPOLAR DISORDER UNSPECIFIED   . IRRITABLE BOWEL SYNDROME, HX OF   . Hypertension   . Atrial tachycardia   . Hypertension   . Headache(784.0)     usually with cycle  . Bursitis of hip 09/1999    Bilateral - Dr. Berenice Primas  . Shortness of  breath     Pulmonary eval 05/2002  . Mitral valve prolapse 12/17/2000    cardiolyte study 12/2000 neg  . Fistula of female genitalia 03/29/2007    enterovesical fistula, not reapired  . Anxiety   . Menopausal state 01/2012    FSH = 88.5  . Thyroid nodule 2014    Past Surgical History  Procedure Laterality Date  . Lipoma (r) side  1990    Right back    Current Outpatient Prescriptions  Medication Sig Dispense Refill  . buPROPion (WELLBUTRIN XL) 150 MG 24 hr tablet Take 150 mg by mouth at bedtime.       . Cholecalciferol (VITAMIN D3) 1000 UNITS CAPS Take 1,000 Units by mouth at bedtime.       Marland Kitchen diltiazem (CARDIZEM CD) 180 MG 24 hr capsule Take 1 capsule (180 mg total) by mouth daily.  90 capsule  3  . divalproex (DEPAKOTE) 500 MG 24 hr tablet Take 500 mg by mouth at bedtime.       Marland Kitchen doxycycline (VIBRAMYCIN) 100 MG capsule Take 100 mg by mouth daily.       Marland Kitchen estradiol (ESTRACE) 0.5 MG tablet Take 1 tablet (0.5 mg total) by mouth daily.  90 tablet  2  . fluticasone (FLONASE) 50 MCG/ACT nasal  spray Place 2 sprays into the nose daily as needed. For allergies      . lidocaine (XYLOCAINE) 2 % jelly Apply topically as needed (for dysparunia).  30 mL  12  . medroxyPROGESTERone (PROVERA) 5 MG tablet Take 1 tablet (5 mg total) by mouth daily.  90 tablet  2  . NEXIUM 40 MG capsule TAKE 1 CAPSULE (40 MG TOTAL) BY MOUTH AT BEDTIME.  90 capsule  3  . nitrofurantoin, macrocrystal-monohydrate, (MACROBID) 100 MG capsule Take 1 capsule (100 mg total) by mouth once as needed (post coital).  30 capsule  6  . NONFORMULARY OR COMPOUNDED ITEM Use twice weekly  60 each  2  . polyethylene glycol powder (GLYCOLAX/MIRALAX) powder Take 17 g by mouth daily.  255 g  11   No current facility-administered medications for this visit.    Allergies  Allergen Reactions  . Lopressor [Metoprolol]     Hallucinations hair falls out    History   Social History  . Marital Status: Married    Spouse Name: N/A     Number of Children: 0  . Years of Education: N/A   Occupational History  . 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 Smoking status: Former Smoker -- 1.00 packs/day for 25 years    Types: Cigarettes    Quit date: 02/11/1996  . Smokeless tobacco: Never Used     Comment: Married, lives with spouse. 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 Alcohol Use: 1.5 oz/week    3 drink(s) per week  . Drug Use: No  . Sexual Activity: Yes    Partners: Male    Birth Control/ Protection: Post-menopausal   Other Topics Concern  . Not on file   Social History Narrative   Married, lives with spouse in [place]. 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 Lung cancer Mother   . Hypertension Mother   . Hyperlipidemia Mother   . Cancer Father     Esophageal cancer  . Hyperlipidemia Father   . Colon cancer Paternal Uncle   . Sarcoidosis Other   . Testicular cancer Other     Review of Systems:  As stated in the HPI and otherwise negative.   BP 108/72  Pulse 76  Ht 5\' 9"  (1.753 m)  Wt 167 lb (75.751 kg)  BMI 24.65 kg/m2  LMP 05/11/2012  Physical Examination: General: Well developed, well nourished, NAD HEENT: OP clear, mucus membranes moist SKIN: warm, dry. No rashes. Neuro: No focal deficits Musculoskeletal: Muscle strength 5/5 all ext Psychiatric: Mood and affect normal Neck: No JVD, no carotid bruits, no thyromegaly, no lymphadenopathy. Lungs:Clear bilaterally, no wheezes, rhonci, crackles Cardiovascular: Regular rate and rhythm. No murmurs, gallops or rubs. Abdomen:Soft. Bowel sounds present. Non-tender.  Extremities: No lower extremity edema. Pulses are 2 + in the bilateral DP/PT.  EKG: NSR, rate 76 bpm. Non-specific T wave abnormalities.   Assessment and Plan:   1. SVT: She is known to have a long RP tachycardia. Tolerating Cardizem.  If she has worsening of symptoms, would start Flecainide and arrange  treadmill but she does not wish to do this at this time.   2. Mitral regurgitation: mild by echo September 2013.

## 2013-08-30 ENCOUNTER — Other Ambulatory Visit: Payer: Self-pay

## 2013-08-30 DIAGNOSIS — Z1231 Encounter for screening mammogram for malignant neoplasm of breast: Secondary | ICD-10-CM

## 2013-09-01 ENCOUNTER — Telehealth: Payer: Self-pay | Admitting: Emergency Medicine

## 2013-09-01 ENCOUNTER — Other Ambulatory Visit: Payer: Self-pay | Admitting: Obstetrics and Gynecology

## 2013-09-01 MED ORDER — NONFORMULARY OR COMPOUNDED ITEM: Status: DC

## 2013-09-01 NOTE — Telephone Encounter (Signed)
[employer] calling to clarify prescription.  They state patient brought in paper rx today that was written and signed by Milford Cage, [place] on 05/24/13. However, there is no medication ordered, just states non-formulary or compounded item.   Patient has used Testosterone Jelly 2% in the past and they wanted to confirm that this was the prescription.   Advised would discuss with covering provider and return call. Requested I speak with Sam, Pharmacist at return call.   Okay to give Verbal order to pharmacist for Testosterone Jelly 2%?

## 2013-09-01 NOTE — Telephone Encounter (Signed)
I will order Testosterone in petrolatum. Order signed. Will be printed out and then faxed in.

## 2013-09-02 NOTE — Telephone Encounter (Signed)
Order faxed to [facility]. Pharmacist Sam, notified.  Routing to provider for final review. Patient agreeable to disposition. Will close encounter

## 2013-09-19 ENCOUNTER — Ambulatory Visit
Admission: RE | Admit: 2013-09-19 | Discharge: 2013-09-19 | Disposition: A | Discharge status: Home / Self Care | Payer: 59 | Source: Ambulatory Visit

## 2013-09-19 DIAGNOSIS — Z1231 Encounter for screening mammogram for malignant neoplasm of breast: Secondary | ICD-10-CM

## 2013-10-10 ENCOUNTER — Other Ambulatory Visit: Payer: Self-pay

## 2013-10-10 MED ORDER — ESOMEPRAZOLE MAGNESIUM 40 MG PO CPDR: DELAYED_RELEASE_CAPSULE | ORAL | Status: DC

## 2013-10-27 ENCOUNTER — Encounter: Payer: Self-pay | Admitting: Family Medicine

## 2013-10-27 ENCOUNTER — Ambulatory Visit (INDEPENDENT_AMBULATORY_CARE_PROVIDER_SITE_OTHER): Payer: 59 | Admitting: Family Medicine

## 2013-10-27 VITALS — BP 149/82 | HR 92 | Temp 97.8°F | Ht 68.75 in | Wt 168.4 lb

## 2013-10-27 DIAGNOSIS — Z0000 Encounter for general adult medical examination without abnormal findings: Secondary | ICD-10-CM

## 2013-10-27 DIAGNOSIS — E042 Nontoxic multinodular goiter: Secondary | ICD-10-CM

## 2013-10-27 DIAGNOSIS — F319 Bipolar disorder, unspecified: Secondary | ICD-10-CM

## 2013-10-27 NOTE — Progress Notes (Signed)
Subjective:     Beverly Cole is a 53 y.o. female and is here for a comprehensive physical exam. The patient reports no problems.  History   Social History  . Marital Status: Married    Spouse Name: N/A    Number of Children: 0  . Years of Education: N/A   Occupational History  . 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 Smoking status: Former Smoker -- 1.00 packs/day for 25 years    Types: Cigarettes    Quit date: 02/11/1996  . Smokeless tobacco: Never Used     Comment: Married, lives with spouse. 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 Alcohol Use: 1.5 oz/week    3 drink(s) per week  . Drug Use: No  . Sexual Activity: Yes    Partners: Male    Birth Control/ Protection: Post-menopausal   Other Topics Concern  . Not on file   Social History Narrative   Married, lives with spouse in [place]. Pt is a Marine scientist with private care [employer]  Topic Date Due  . Influenza Vaccine  09/10/2013  . Pap Smear  05/13/2014  . Mammogram  09/20/2015  . Colonoscopy  10/28/2015  . Tetanus/tdap  05/12/2021    The following portions of the patient's history were reviewed and updated as appropriate:  She  has a past medical history of DEPRESSION; GERD; HYPERLIPIDEMIA; MYALGIA; BIPOLAR DISORDER UNSPECIFIED; IRRITABLE BOWEL SYNDROME, HX OF; Hypertension; Atrial tachycardia; Hypertension; Headache(784.0); Bursitis of hip (09/1999); Shortness of breath; Mitral valve prolapse (12/17/2000); Fistula of female genitalia (03/29/2007); Anxiety; Menopausal state (01/2012); and Thyroid nodule (2014). She  does not have any pertinent problems on file. She  has past surgical history that includes Lipoma (R) side (1990). Her family history includes Cancer in her father; Colon cancer in her paternal uncle; Hyperlipidemia in her father and mother; Hypertension in her mother; Lung cancer in her mother; Sarcoidosis in her other; Testicular cancer in her other. She   reports that she quit smoking about 17 years ago. Her smoking use included Cigarettes. She has a 25 pack-year smoking history. She has never used smokeless tobacco. She reports that she drinks about 1.5 ounces of alcohol per week. She reports that she does not use illicit drugs. She has a current medication list which includes the following prescription(s): bupropion, vitamin d3, diltiazem, divalproex, doxycycline, esomeprazole, estradiol, fluticasone, lidocaine, medroxyprogesterone, nitrofurantoin (macrocrystal-monohydrate), NONFORMULARY OR COMPOUNDED ITEM, NONFORMULARY OR COMPOUNDED ITEM, and polyethylene glycol powder. Current Outpatient Prescriptions on File Prior to Visit  Medication Sig Dispense Refill  . buPROPion (WELLBUTRIN XL) 150 MG 24 hr tablet Take 150 mg by mouth at bedtime.       . Cholecalciferol (VITAMIN D3) 1000 UNITS CAPS Take 1,000 Units by mouth at bedtime.       Marland Kitchen diltiazem (CARDIZEM CD) 180 MG 24 hr capsule Take 1 capsule (180 mg total) by mouth daily.  90 capsule  3  . divalproex (DEPAKOTE) 500 MG 24 hr tablet Take 500 mg by mouth at bedtime.       Marland Kitchen doxycycline (VIBRAMYCIN) 100 MG capsule Take 100 mg by mouth daily.       Marland Kitchen esomeprazole (NEXIUM) 40 MG capsule TAKE 1 CAPSULE (40 MG TOTAL) BY MOUTH AT BEDTIME.  90 capsule  3  . estradiol (ESTRACE) 0.5 MG tablet Take 1 tablet (0.5 mg total) by mouth daily.  90 tablet  2  . fluticasone (FLONASE) 50 MCG/ACT nasal  spray Place 2 sprays into the nose daily as needed. For allergies      . lidocaine (XYLOCAINE) 2 % jelly Apply topically as needed (for dysparunia).  30 mL  12  . medroxyPROGESTERone (PROVERA) 5 MG tablet Take 1 tablet (5 mg total) by mouth daily.  90 tablet  2  . nitrofurantoin, macrocrystal-monohydrate, (MACROBID) 100 MG capsule Take 1 capsule (100 mg total) by mouth once as needed (post coital).  30 capsule  6  . NONFORMULARY OR COMPOUNDED ITEM Use twice weekly  60 each  2  . NONFORMULARY OR COMPOUNDED ITEM  Testosterone propionate 2% in white petrolatum, apply bid for 6 weeks and then daily as directed.  60 grams.  60 each  1  . polyethylene glycol powder (GLYCOLAX/MIRALAX) powder Take 17 g by mouth daily.  255 g  11   No current facility-administered medications on file prior to visit.   She is allergic to lopressor..  Review of Systems Review of Systems  Constitutional: Negative for activity change, appetite change and fatigue.  HENT: Negative for hearing loss, congestion, tinnitus and ear discharge.  dentist q59m Eyes: Negative for visual disturbance (see optho q1y -- vision corrected to 20/20 with glasses).  Respiratory: Negative for cough, chest tightness and shortness of breath.   Cardiovascular: Negative for chest pain, palpitations and leg swelling.  Gastrointestinal: Negative for abdominal pain, diarrhea, constipation and abdominal distention.  Genitourinary: Negative for urgency, frequency, decreased urine volume and difficulty urinating.  Musculoskeletal: Negative for back pain, arthralgias and gait problem.  Skin: Negative for color change, pallor and rash.  Neurological: Negative for dizziness, light-headedness, numbness and headaches.  Hematological: Negative for adenopathy. Does not bruise/bleed easily.  Psychiatric/Behavioral: Negative for suicidal ideas, confusion, sleep disturbance, self-injury, dysphoric mood, decreased concentration and agitation.       Objective:    BP 149/82  Pulse 92  Temp(Src) 97.8 F (36.6 C) (Oral)  Ht 5' 8.75" (1.746 m)  Wt 168 lb 6.9 oz (76.4 kg)  BMI 25.06 kg/m2  SpO2 97%  LMP 05/11/2012 General appearance: alert, cooperative, appears stated age and no distress Head: Normocephalic, without obvious abnormality, atraumatic Eyes: conjunctivae/corneas clear. PERRL, EOM's intact. Fundi benign. Ears: normal TM's and external ear canals both ears Nose: Nares normal. Septum midline. Mucosa normal. No drainage or sinus tenderness. Throat:  lips, mucosa, and tongue normal; teeth and gums normal Neck: no adenopathy, no carotid bruit, no JVD, supple, symmetrical, trachea midline and thyroid not enlarged, symmetric, no tenderness/mass/nodules Back: symmetric, no curvature. ROM normal. No CVA tenderness. Lungs: clear to auscultation bilaterally Breasts: normal appearance, no masses or tenderness Heart: regular rate and rhythm, S1, S2 normal, no murmur, click, rub or gallop Abdomen: soft, non-tender; bowel sounds normal; no masses,  no organomegaly Pelvic: deferred Extremities: extremities normal, atraumatic, no cyanosis or edema Pulses: 2+ and symmetric Skin: Skin color, texture, turgor normal. No rashes or lesions Lymph nodes: Cervical, supraclavicular, and axillary nodes normal. Neurologic: Alert and oriented X 3, normal strength and tone. Normal symmetric reflexes. Normal coordination and gait Psych- no depression, no anxiety       Assessment:    Healthy female exam.      Plan:    ghm utd Check labs See After Visit Summary for Counseling Recommendations   1. Multiple thyroid nodules  - US Soft Tissue Head/Neck; Future  2. Bipolar disorder, unspecified Check labs , con't meds - Valproic Acid level; Future  3. Preventative health care Check labs, ghm utd - Valproic Acid  level; Future - Basic metabolic panel; Future - CBC with Differential; Future - Hepatic function panel; Future - Lipid panel; Future - POCT urinalysis dipstick; Future - TSH; Future - Hepatitis C antibody

## 2013-10-27 NOTE — Patient Instructions (Signed)
Preventive Care for Adults A healthy lifestyle and preventive care can promote health and wellness. Preventive health guidelines for women include the following key practices.  A routine yearly physical is a good way to check with your health care provider about your health and preventive screening. It is a chance to share any concerns and updates on your health and to receive a thorough exam.  Visit your dentist for a routine exam and preventive care every 6 months. Brush your teeth twice a day and floss once a day. Good oral hygiene prevents tooth decay and gum disease.  The frequency of eye exams is based on your age, health, family medical history, use of contact lenses, and other factors. Follow your health care provider's recommendations for frequency of eye exams.  Eat a healthy diet. Foods like vegetables, fruits, whole grains, low-fat dairy products, and lean protein foods contain the nutrients you need without too many calories. Decrease your intake of foods high in solid fats, added sugars, and salt. Eat the right amount of calories for you.Get information about a proper diet from your health care provider, if necessary.  Regular physical exercise is one of the most important things you can do for your health. Most adults should get at least 150 minutes of moderate-intensity exercise (any activity that increases your heart rate and causes you to sweat) each week. In addition, most adults need muscle-strengthening exercises on 2 or more days a week.  Maintain a healthy weight. The body mass index (BMI) is a screening tool to identify possible weight problems. It provides an estimate of body fat based on height and weight. Your health care provider can find your BMI and can help you achieve or maintain a healthy weight.For adults 20 years and older:  A BMI below 18.5 is considered underweight.  A BMI of 18.5 to 24.9 is normal.  A BMI of 25 to 29.9 is considered overweight.  A BMI of  30 and above is considered obese.  Maintain normal blood lipids and cholesterol levels by exercising and minimizing your intake of saturated fat. Eat a balanced diet with plenty of fruit and vegetables. Blood tests for lipids and cholesterol should begin at age 76 and be repeated every 5 years. If your lipid or cholesterol levels are high, you are over 50, or you are at high risk for heart disease, you may need your cholesterol levels checked more frequently.Ongoing high lipid and cholesterol levels should be treated with medicines if diet and exercise are not working.  If you smoke, find out from your health care provider how to quit. If you do not use tobacco, do not start.  Lung cancer screening is recommended for adults aged 22-80 years who are at high risk for developing lung cancer because of a history of smoking. A yearly low-dose CT scan of the lungs is recommended for people who have at least a 30-pack-year history of smoking and are a current smoker or have quit within the past 15 years. A pack year of smoking is smoking an average of 1 pack of cigarettes a day for 1 year (for example: 1 pack a day for 30 years or 2 packs a day for 15 years). Yearly screening should continue until the smoker has stopped smoking for at least 15 years. Yearly screening should be stopped for people who develop a health problem that would prevent them from having lung cancer treatment.  If you are pregnant, do not drink alcohol. If you are breastfeeding,  be very cautious about drinking alcohol. If you are not pregnant and choose to drink alcohol, do not have more than 1 drink per day. One drink is considered to be 12 ounces (355 mL) of beer, 5 ounces (148 mL) of wine, or 1.5 ounces (44 mL) of liquor.  Avoid use of street drugs. Do not share needles with anyone. Ask for help if you need support or instructions about stopping the use of drugs.  High blood pressure causes heart disease and increases the risk of  stroke. Your blood pressure should be checked at least every 1 to 2 years. Ongoing high blood pressure should be treated with medicines if weight loss and exercise do not work.  If you are 75-52 years old, ask your health care provider if you should take aspirin to prevent strokes.  Diabetes screening involves taking a blood sample to check your fasting blood sugar level. This should be done once every 3 years, after age 15, if you are within normal weight and without risk factors for diabetes. Testing should be considered at a younger age or be carried out more frequently if you are overweight and have at least 1 risk factor for diabetes.  Breast cancer screening is essential preventive care for women. You should practice "breast self-awareness." This means understanding the normal appearance and feel of your breasts and may include breast self-examination. Any changes detected, no matter how small, should be reported to a health care provider. Women in their 58s and 30s should have a clinical breast exam (CBE) by a health care provider as part of a regular health exam every 1 to 3 years. After age 16, women should have a CBE every year. Starting at age 53, women should consider having a mammogram (breast X-ray test) every year. Women who have a family history of breast cancer should talk to their health care provider about genetic screening. Women at a high risk of breast cancer should talk to their health care providers about having an MRI and a mammogram every year.  Breast cancer gene (BRCA)-related cancer risk assessment is recommended for women who have family members with BRCA-related cancers. BRCA-related cancers include breast, ovarian, tubal, and peritoneal cancers. Having family members with these cancers may be associated with an increased risk for harmful changes (mutations) in the breast cancer genes BRCA1 and BRCA2. Results of the assessment will determine the need for genetic counseling and  BRCA1 and BRCA2 testing.  Routine pelvic exams to screen for cancer are no longer recommended for nonpregnant women who are considered low risk for cancer of the pelvic organs (ovaries, uterus, and vagina) and who do not have symptoms. Ask your health care provider if a screening pelvic exam is right for you.  If you have had past treatment for cervical cancer or a condition that could lead to cancer, you need Pap tests and screening for cancer for at least 20 years after your treatment. If Pap tests have been discontinued, your risk factors (such as having a new sexual partner) need to be reassessed to determine if screening should be resumed. Some women have medical problems that increase the chance of getting cervical cancer. In these cases, your health care provider may recommend more frequent screening and Pap tests.  The HPV test is an additional test that may be used for cervical cancer screening. The HPV test looks for the virus that can cause the cell changes on the cervix. The cells collected during the Pap test can be  tested for HPV. The HPV test could be used to screen women aged 30 years and older, and should be used in women of any age who have unclear Pap test results. After the age of 30, women should have HPV testing at the same frequency as a Pap test.  Colorectal cancer can be detected and often prevented. Most routine colorectal cancer screening begins at the age of 50 years and continues through age 75 years. However, your health care provider may recommend screening at an earlier age if you have risk factors for colon cancer. On a yearly basis, your health care provider may provide home test kits to check for hidden blood in the stool. Use of a small camera at the end of a tube, to directly examine the colon (sigmoidoscopy or colonoscopy), can detect the earliest forms of colorectal cancer. Talk to your health care provider about this at age 50, when routine screening begins. Direct  exam of the colon should be repeated every 5-10 years through age 75 years, unless early forms of pre-cancerous polyps or small growths are found.  People who are at an increased risk for hepatitis B should be screened for this virus. You are considered at high risk for hepatitis B if:  You were born in a country where hepatitis B occurs often. Talk with your health care provider about which countries are considered high risk.  Your parents were born in a high-risk country and you have not received a shot to protect against hepatitis B (hepatitis B vaccine).  You have HIV or AIDS.  You use needles to inject street drugs.  You live with, or have sex with, someone who has hepatitis B.  You get hemodialysis treatment.  You take certain medicines for conditions like cancer, organ transplantation, and autoimmune conditions.  Hepatitis C blood testing is recommended for all people born from 1945 through 1965 and any individual with known risks for hepatitis C.  Practice safe sex. Use condoms and avoid high-risk sexual practices to reduce the spread of sexually transmitted infections (STIs). STIs include gonorrhea, chlamydia, syphilis, trichomonas, herpes, HPV, and human immunodeficiency virus (HIV). Herpes, HIV, and HPV are viral illnesses that have no cure. They can result in disability, cancer, and death.  You should be screened for sexually transmitted illnesses (STIs) including gonorrhea and chlamydia if:  You are sexually active and are younger than 24 years.  You are older than 24 years and your health care provider tells you that you are at risk for this type of infection.  Your sexual activity has changed since you were last screened and you are at an increased risk for chlamydia or gonorrhea. Ask your health care provider if you are at risk.  If you are at risk of being infected with HIV, it is recommended that you take a prescription medicine daily to prevent HIV infection. This is  called preexposure prophylaxis (PrEP). You are considered at risk if:  You are a heterosexual woman, are sexually active, and are at increased risk for HIV infection.  You take drugs by injection.  You are sexually active with a partner who has HIV.  Talk with your health care provider about whether you are at high risk of being infected with HIV. If you choose to begin PrEP, you should first be tested for HIV. You should then be tested every 3 months for as long as you are taking PrEP.  Osteoporosis is a disease in which the bones lose minerals and strength   with aging. This can result in serious bone fractures or breaks. The risk of osteoporosis can be identified using a bone density scan. Women ages 65 years and over and women at risk for fractures or osteoporosis should discuss screening with their health care providers. Ask your health care provider whether you should take a calcium supplement or vitamin D to reduce the rate of osteoporosis.  Menopause can be associated with physical symptoms and risks. Hormone replacement therapy is available to decrease symptoms and risks. You should talk to your health care provider about whether hormone replacement therapy is right for you.  Use sunscreen. Apply sunscreen liberally and repeatedly throughout the day. You should seek shade when your shadow is shorter than you. Protect yourself by wearing long sleeves, pants, a wide-brimmed hat, and sunglasses year round, whenever you are outdoors.  Once a month, do a whole body skin exam, using a mirror to look at the skin on your back. Tell your health care provider of new moles, moles that have irregular borders, moles that are larger than a pencil eraser, or moles that have changed in shape or color.  Stay current with required vaccines (immunizations).  Influenza vaccine. All adults should be immunized every year.  Tetanus, diphtheria, and acellular pertussis (Td, Tdap) vaccine. Pregnant women should  receive 1 dose of Tdap vaccine during each pregnancy. The dose should be obtained regardless of the length of time since the last dose. Immunization is preferred during the 27th-36th week of gestation. An adult who has not previously received Tdap or who does not know her vaccine status should receive 1 dose of Tdap. This initial dose should be followed by tetanus and diphtheria toxoids (Td) booster doses every 10 years. Adults with an unknown or incomplete history of completing a 3-dose immunization series with Td-containing vaccines should begin or complete a primary immunization series including a Tdap dose. Adults should receive a Td booster every 10 years.  Varicella vaccine. An adult without evidence of immunity to varicella should receive 2 doses or a second dose if she has previously received 1 dose. Pregnant females who do not have evidence of immunity should receive the first dose after pregnancy. This first dose should be obtained before leaving the health care facility. The second dose should be obtained 4-8 weeks after the first dose.  Human papillomavirus (HPV) vaccine. Females aged 13-26 years who have not received the vaccine previously should obtain the 3-dose series. The vaccine is not recommended for use in pregnant females. However, pregnancy testing is not needed before receiving a dose. If a female is found to be pregnant after receiving a dose, no treatment is needed. In that case, the remaining doses should be delayed until after the pregnancy. Immunization is recommended for any person with an immunocompromised condition through the age of 26 years if she did not get any or all doses earlier. During the 3-dose series, the second dose should be obtained 4-8 weeks after the first dose. The third dose should be obtained 24 weeks after the first dose and 16 weeks after the second dose.  Zoster vaccine. One dose is recommended for adults aged 60 years or older unless certain conditions are  present.  Measles, mumps, and rubella (MMR) vaccine. Adults born before 1957 generally are considered immune to measles and mumps. Adults born in 1957 or later should have 1 or more doses of MMR vaccine unless there is a contraindication to the vaccine or there is laboratory evidence of immunity to   each of the three diseases. A routine second dose of MMR vaccine should be obtained at least 28 days after the first dose for students attending postsecondary schools, health care workers, or international travelers. People who received inactivated measles vaccine or an unknown type of measles vaccine during 1963-1967 should receive 2 doses of MMR vaccine. People who received inactivated mumps vaccine or an unknown type of mumps vaccine before 1979 and are at high risk for mumps infection should consider immunization with 2 doses of MMR vaccine. For females of childbearing age, rubella immunity should be determined. If there is no evidence of immunity, females who are not pregnant should be vaccinated. If there is no evidence of immunity, females who are pregnant should delay immunization until after pregnancy. Unvaccinated health care workers born before 1957 who lack laboratory evidence of measles, mumps, or rubella immunity or laboratory confirmation of disease should consider measles and mumps immunization with 2 doses of MMR vaccine or rubella immunization with 1 dose of MMR vaccine.  Pneumococcal 13-valent conjugate (PCV13) vaccine. When indicated, a person who is uncertain of her immunization history and has no record of immunization should receive the PCV13 vaccine. An adult aged 19 years or older who has certain medical conditions and has not been previously immunized should receive 1 dose of PCV13 vaccine. This PCV13 should be followed with a dose of pneumococcal polysaccharide (PPSV23) vaccine. The PPSV23 vaccine dose should be obtained at least 8 weeks after the dose of PCV13 vaccine. An adult aged 19  years or older who has certain medical conditions and previously received 1 or more doses of PPSV23 vaccine should receive 1 dose of PCV13. The PCV13 vaccine dose should be obtained 1 or more years after the last PPSV23 vaccine dose.  Pneumococcal polysaccharide (PPSV23) vaccine. When PCV13 is also indicated, PCV13 should be obtained first. All adults aged 65 years and older should be immunized. An adult younger than age 65 years who has certain medical conditions should be immunized. Any person who resides in a nursing home or long-term care facility should be immunized. An adult smoker should be immunized. People with an immunocompromised condition and certain other conditions should receive both PCV13 and PPSV23 vaccines. People with human immunodeficiency virus (HIV) infection should be immunized as soon as possible after diagnosis. Immunization during chemotherapy or radiation therapy should be avoided. Routine use of PPSV23 vaccine is not recommended for American Indians, [place] Natives, or people younger than 65 years unless there are medical conditions that require PPSV23 vaccine. When indicated, people who have unknown immunization and have no record of immunization should receive PPSV23 vaccine. One-time revaccination 5 years after the first dose of PPSV23 is recommended for people aged 19-64 years who have chronic kidney failure, nephrotic syndrome, asplenia, or immunocompromised conditions. People who received 1-2 doses of PPSV23 before age 65 years should receive another dose of PPSV23 vaccine at age 65 years or later if at least 5 years have passed since the previous dose. Doses of PPSV23 are not needed for people immunized with PPSV23 at or after age 65 years.  Meningococcal vaccine. Adults with asplenia or persistent complement component deficiencies should receive 2 doses of quadrivalent meningococcal conjugate (MenACWY-D) vaccine. The doses should be obtained at least 2 months apart.  Microbiologists working with certain meningococcal bacteria, military recruits, people at risk during an outbreak, and people who travel to or live in countries with a high rate of meningitis should be immunized. A first-year college student up through age   21 years who is living in a residence hall should receive a dose if she did not receive a dose on or after her 16th birthday. Adults who have certain high-risk conditions should receive one or more doses of vaccine.  Hepatitis A vaccine. Adults who wish to be protected from this disease, have certain high-risk conditions, work with hepatitis A-infected animals, work in hepatitis A research labs, or travel to or work in countries with a high rate of hepatitis A should be immunized. Adults who were previously unvaccinated and who anticipate close contact with an international adoptee during the first 60 days after arrival in the [place] from a country with a high rate of hepatitis A should be immunized.  Hepatitis B vaccine. Adults who wish to be protected from this disease, have certain high-risk conditions, may be exposed to blood or other infectious body fluids, are household contacts or sex partners of hepatitis B positive people, are clients or workers in certain care facilities, or travel to or work in countries with a high rate of hepatitis B should be immunized.  Haemophilus influenzae type b (Hib) vaccine. A previously unvaccinated person with asplenia or sickle cell disease or having a scheduled splenectomy should receive 1 dose of Hib vaccine. Regardless of previous immunization, a recipient of a hematopoietic stem cell transplant should receive a 3-dose series 6-12 months after her successful transplant. Hib vaccine is not recommended for adults with HIV infection. Preventive Services / Frequency Ages 64 to 68 years  Blood pressure check.** / Every 1 to 2 years.  Lipid and cholesterol check.** / Every 5 years beginning at age  22.  Clinical breast exam.** / Every 3 years for women in their 88s and 53s.  BRCA-related cancer risk assessment.** / For women who have family members with a BRCA-related cancer (breast, ovarian, tubal, or peritoneal cancers).  Pap test.** / Every 2 years from ages 90 through 51. Every 3 years starting at age 21 through age 56 or 3 with a history of 3 consecutive normal Pap tests.  HPV screening.** / Every 3 years from ages 24 through ages 1 to 46 with a history of 3 consecutive normal Pap tests.  Hepatitis C blood test.** / For any individual with known risks for hepatitis C.  Skin self-exam. / Monthly.  Influenza vaccine. / Every year.  Tetanus, diphtheria, and acellular pertussis (Tdap, Td) vaccine.** / Consult your health care provider. Pregnant women should receive 1 dose of Tdap vaccine during each pregnancy. 1 dose of Td every 10 years.  Varicella vaccine.** / Consult your health care provider. Pregnant females who do not have evidence of immunity should receive the first dose after pregnancy.  HPV vaccine. / 3 doses over 6 months, if 72 and younger. The vaccine is not recommended for use in pregnant females. However, pregnancy testing is not needed before receiving a dose.  Measles, mumps, rubella (MMR) vaccine.** / You need at least 1 dose of MMR if you were born in 1957 or later. You may also need a 2nd dose. For females of childbearing age, rubella immunity should be determined. If there is no evidence of immunity, females who are not pregnant should be vaccinated. If there is no evidence of immunity, females who are pregnant should delay immunization until after pregnancy.  Pneumococcal 13-valent conjugate (PCV13) vaccine.** / Consult your health care provider.  Pneumococcal polysaccharide (PPSV23) vaccine.** / 1 to 2 doses if you smoke cigarettes or if you have certain conditions.  Meningococcal vaccine.** /  1 dose if you are age 19 to 21 years and a first-year college  student living in a residence hall, or have one of several medical conditions, you need to get vaccinated against meningococcal disease. You may also need additional booster doses.  Hepatitis A vaccine.** / Consult your health care provider.  Hepatitis B vaccine.** / Consult your health care provider.  Haemophilus influenzae type b (Hib) vaccine.** / Consult your health care provider. Ages 40 to 64 years  Blood pressure check.** / Every 1 to 2 years.  Lipid and cholesterol check.** / Every 5 years beginning at age 20 years.  Lung cancer screening. / Every year if you are aged 55-80 years and have a 30-pack-year history of smoking and currently smoke or have quit within the past 15 years. Yearly screening is stopped once you have quit smoking for at least 15 years or develop a health problem that would prevent you from having lung cancer treatment.  Clinical breast exam.** / Every year after age 40 years.  BRCA-related cancer risk assessment.** / For women who have family members with a BRCA-related cancer (breast, ovarian, tubal, or peritoneal cancers).  Mammogram.** / Every year beginning at age 40 years and continuing for as long as you are in good health. Consult with your health care provider.  Pap test.** / Every 3 years starting at age 30 years through age 65 or 70 years with a history of 3 consecutive normal Pap tests.  HPV screening.** / Every 3 years from ages 30 years through ages 65 to 70 years with a history of 3 consecutive normal Pap tests.  Fecal occult blood test (FOBT) of stool. / Every year beginning at age 50 years and continuing until age 75 years. You may not need to do this test if you get a colonoscopy every 10 years.  Flexible sigmoidoscopy or colonoscopy.** / Every 5 years for a flexible sigmoidoscopy or every 10 years for a colonoscopy beginning at age 50 years and continuing until age 75 years.  Hepatitis C blood test.** / For all people born from 1945 through  1965 and any individual with known risks for hepatitis C.  Skin self-exam. / Monthly.  Influenza vaccine. / Every year.  Tetanus, diphtheria, and acellular pertussis (Tdap/Td) vaccine.** / Consult your health care provider. Pregnant women should receive 1 dose of Tdap vaccine during each pregnancy. 1 dose of Td every 10 years.  Varicella vaccine.** / Consult your health care provider. Pregnant females who do not have evidence of immunity should receive the first dose after pregnancy.  Zoster vaccine.** / 1 dose for adults aged 60 years or older.  Measles, mumps, rubella (MMR) vaccine.** / You need at least 1 dose of MMR if you were born in 1957 or later. You may also need a 2nd dose. For females of childbearing age, rubella immunity should be determined. If there is no evidence of immunity, females who are not pregnant should be vaccinated. If there is no evidence of immunity, females who are pregnant should delay immunization until after pregnancy.  Pneumococcal 13-valent conjugate (PCV13) vaccine.** / Consult your health care provider.  Pneumococcal polysaccharide (PPSV23) vaccine.** / 1 to 2 doses if you smoke cigarettes or if you have certain conditions.  Meningococcal vaccine.** / Consult your health care provider.  Hepatitis A vaccine.** / Consult your health care provider.  Hepatitis B vaccine.** / Consult your health care provider.  Haemophilus influenzae type b (Hib) vaccine.** / Consult your health care provider. Ages 65   years and over  Blood pressure check.** / Every 1 to 2 years.  Lipid and cholesterol check.** / Every 5 years beginning at age 22 years.  Lung cancer screening. / Every year if you are aged 73-80 years and have a 30-pack-year history of smoking and currently smoke or have quit within the past 15 years. Yearly screening is stopped once you have quit smoking for at least 15 years or develop a health problem that would prevent you from having lung cancer  treatment.  Clinical breast exam.** / Every year after age 4 years.  BRCA-related cancer risk assessment.** / For women who have family members with a BRCA-related cancer (breast, ovarian, tubal, or peritoneal cancers).  Mammogram.** / Every year beginning at age 40 years and continuing for as long as you are in good health. Consult with your health care provider.  Pap test.** / Every 3 years starting at age 9 years through age 34 or 91 years with 3 consecutive normal Pap tests. Testing can be stopped between 65 and 70 years with 3 consecutive normal Pap tests and no abnormal Pap or HPV tests in the past 10 years.  HPV screening.** / Every 3 years from ages 57 years through ages 64 or 45 years with a history of 3 consecutive normal Pap tests. Testing can be stopped between 65 and 70 years with 3 consecutive normal Pap tests and no abnormal Pap or HPV tests in the past 10 years.  Fecal occult blood test (FOBT) of stool. / Every year beginning at age 15 years and continuing until age 17 years. You may not need to do this test if you get a colonoscopy every 10 years.  Flexible sigmoidoscopy or colonoscopy.** / Every 5 years for a flexible sigmoidoscopy or every 10 years for a colonoscopy beginning at age 86 years and continuing until age 71 years.  Hepatitis C blood test.** / For all people born from [birth year] through 1966 and any individual with known risks for hepatitis C.  Osteoporosis screening.** / A one-time screening for women ages 83 years and over and women at risk for fractures or osteoporosis.  Skin self-exam. / Monthly.  Influenza vaccine. / Every year.  Tetanus, diphtheria, and acellular pertussis (Tdap/Td) vaccine.** / 1 dose of Td every 10 years.  Varicella vaccine.** / Consult your health care provider.  Zoster vaccine.** / 1 dose for adults aged 61 years or older.  Pneumococcal 13-valent conjugate (PCV13) vaccine.** / Consult your health care provider.  Pneumococcal  polysaccharide (PPSV23) vaccine.** / 1 dose for all adults aged 28 years and older.  Meningococcal vaccine.** / Consult your health care provider.  Hepatitis A vaccine.** / Consult your health care provider.  Hepatitis B vaccine.** / Consult your health care provider.  Haemophilus influenzae type b (Hib) vaccine.** / Consult your health care provider. ** Family history and personal history of risk and conditions may change your health care provider's recommendations. Document Released: 03/25/2001 Document Revised: 06/13/2013 Document Reviewed: 06/24/2010 [facility] Patient Information 2015 [place], [place]. This information is not intended to replace advice given to you by your health care provider. Make sure you discuss any questions you have with your health care provider.

## 2013-10-27 NOTE — Progress Notes (Signed)
Pre visit review using our clinic review tool, if applicable. No additional management support is needed unless otherwise documented below in the visit note. 

## 2013-10-31 ENCOUNTER — Ambulatory Visit (HOSPITAL_COMMUNITY): Payer: 59

## 2013-11-02 ENCOUNTER — Other Ambulatory Visit (INDEPENDENT_AMBULATORY_CARE_PROVIDER_SITE_OTHER): Payer: 59

## 2013-11-02 DIAGNOSIS — F319 Bipolar disorder, unspecified: Secondary | ICD-10-CM

## 2013-11-02 DIAGNOSIS — Z0000 Encounter for general adult medical examination without abnormal findings: Secondary | ICD-10-CM

## 2013-11-02 LAB — CBC WITH DIFFERENTIAL/PLATELET
Basophils Absolute: 0 10*3/uL (ref 0.0–0.1)
Basophils Relative: 0.5 % (ref 0.0–3.0)
Eosinophils Absolute: 0.1 10*3/uL (ref 0.0–0.7)
Eosinophils Relative: 1.2 % (ref 0.0–5.0)
HCT: 41.6 % (ref 36.0–46.0)
Hemoglobin: 13.9 g/dL (ref 12.0–15.0)
Lymphocytes Relative: 36.2 % (ref 12.0–46.0)
Lymphs Abs: 2.4 10*3/uL (ref 0.7–4.0)
MCHC: 33.5 g/dL (ref 30.0–36.0)
MCV: 88.3 fl (ref 78.0–100.0)
Monocytes Absolute: 0.5 10*3/uL (ref 0.1–1.0)
Monocytes Relative: 7.6 % (ref 3.0–12.0)
Neutro Abs: 3.6 10*3/uL (ref 1.4–7.7)
Neutrophils Relative %: 54.5 % (ref 43.0–77.0)
Platelets: 215 10*3/uL (ref 150.0–400.0)
RBC: 4.71 Mil/uL (ref 3.87–5.11)
RDW: 14.6 % (ref 11.5–15.5)
WBC: 6.6 10*3/uL (ref 4.0–10.5)

## 2013-11-02 LAB — BASIC METABOLIC PANEL
BUN: 10 mg/dL (ref 6–23)
CO2: 25 mEq/L (ref 19–32)
Calcium: 8.7 mg/dL (ref 8.4–10.5)
Chloride: 104 mEq/L (ref 96–112)
Creatinine, Ser: 0.9 mg/dL (ref 0.4–1.2)
GFR: 65.97 mL/min (ref >60.00)
Glucose, Bld: 83 mg/dL (ref 70–99)
Potassium: 3.1 mEq/L — ABNORMAL LOW (ref 3.5–5.1)
Sodium: 137 mEq/L (ref 135–145)

## 2013-11-02 LAB — LIPID PANEL
Cholesterol: 240 mg/dL — ABNORMAL HIGH (ref 0–200)
HDL: 43.4 mg/dL (ref >39.00)
LDL Cholesterol: 158 mg/dL — ABNORMAL HIGH (ref 0–99)
NonHDL: 196.6
Total CHOL/HDL Ratio: 6
Triglycerides: 191 mg/dL — ABNORMAL HIGH (ref 0.0–149.0)
VLDL: 38.2 mg/dL (ref 0.0–40.0)

## 2013-11-02 LAB — TSH: TSH: 1.58 u[IU]/mL (ref 0.35–4.50)

## 2013-11-02 LAB — HEPATIC FUNCTION PANEL
ALT: 16 U/L (ref 0–35)
AST: 21 U/L (ref 0–37)
Albumin: 4 g/dL (ref 3.5–5.2)
Alkaline Phosphatase: 47 U/L (ref 39–117)
Bilirubin, Direct: 0 mg/dL (ref 0.0–0.3)
Total Bilirubin: 0.4 mg/dL (ref 0.2–1.2)
Total Protein: 7.4 g/dL (ref 6.0–8.3)

## 2013-11-03 ENCOUNTER — Telehealth: Payer: Self-pay | Admitting: Family Medicine

## 2013-11-03 LAB — VALPROIC ACID LEVEL: Valproic Acid Lvl: 95.1 ug/mL (ref 50.0–100.0)

## 2013-11-03 MED ORDER — ZOLPIDEM TARTRATE 5 MG PO TABS: 5.0000 mg | ORAL_TABLET | Freq: Every evening | ORAL | Status: DC | PRN

## 2013-11-03 NOTE — Telephone Encounter (Signed)
Caller name: Deziya Relation to pt: self  Call back number: Ambien    Reason for call:   pt requesting Ambien, pt brother has cancer and she has to drive to he's location. The car ride is long, pt stated she used Xanax a longer time ago and it didn't work. Please advise

## 2013-11-03 NOTE — Telephone Encounter (Signed)
Per MD the Call in Ambien 5 mg 1 po qhs prn #10.  Rx faxed to [facility] pharmacy

## 2013-11-04 ENCOUNTER — Other Ambulatory Visit: Payer: Self-pay

## 2013-11-04 MED ORDER — SIMVASTATIN 20 MG PO TABS: 20.0000 mg | ORAL_TABLET | Freq: Every day | ORAL | Status: DC

## 2013-11-11 ENCOUNTER — Encounter: Payer: Self-pay | Admitting: Nurse Practitioner

## 2013-11-28 ENCOUNTER — Telehealth: Payer: Self-pay | Admitting: Family Medicine

## 2013-11-28 DIAGNOSIS — E010 Iodine-deficiency related diffuse (endemic) goiter: Secondary | ICD-10-CM

## 2013-11-28 NOTE — Telephone Encounter (Signed)
Caller name: Faduma  Call back [phone]   Reason for call:  Pt had [place] SOFT TIS HEAD/NECK/CHEST on 9/21 but canceled.  Now they state we have to place the order again before they will see her.  Can we get order placed again.

## 2013-11-28 NOTE — Telephone Encounter (Signed)
Order has been placed.     KP 

## 2013-12-02 ENCOUNTER — Ambulatory Visit (HOSPITAL_COMMUNITY)
Admission: RE | Admit: 2013-12-02 | Discharge: 2013-12-02 | Disposition: A | Discharge status: Home / Self Care | Payer: 59 | Source: Ambulatory Visit | Attending: Family Medicine | Admitting: Family Medicine

## 2013-12-02 DIAGNOSIS — E042 Nontoxic multinodular goiter: Secondary | ICD-10-CM | POA: Diagnosis not present

## 2013-12-02 DIAGNOSIS — E010 Iodine-deficiency related diffuse (endemic) goiter: Secondary | ICD-10-CM

## 2013-12-02 DIAGNOSIS — E041 Nontoxic single thyroid nodule: Secondary | ICD-10-CM | POA: Diagnosis present

## 2013-12-08 ENCOUNTER — Encounter: Payer: Self-pay | Admitting: Family Medicine

## 2013-12-27 ENCOUNTER — Other Ambulatory Visit: Payer: Self-pay | Admitting: Cardiovascular Disease

## 2014-02-13 ENCOUNTER — Other Ambulatory Visit: Payer: Self-pay | Admitting: Nurse Practitioner

## 2014-02-13 NOTE — Telephone Encounter (Signed)
Pt is calling to speak with Joy.She has questions regarding her prescription.

## 2014-02-13 NOTE — Telephone Encounter (Signed)
Medication refill request: Estrace 0.5mg , Provera 5mg  Last AEX:  05-24-13 Next AEX: 06-01-14 Last MMG (if hormonal medication request): 09-19-13 neg Refill authorized: last given by Kem Boroughs on 05-24-13 #90 with 2 refills. Pt was to comeback for 61mth f/u to discuss therapy. Pt sent an email  10/15 & was addressed by Dr. Quincy Simmonds. Pt stated she was having night sweats & wanted to know if dosage needed to be increased. Dr Quincy Simmonds recommended that patient come in for office visit. No appt has been scheduled for f/u. Next aex is 06-01-14. LMTCB to get patient to schedule f/u.

## 2014-02-13 NOTE — Telephone Encounter (Signed)
Spoke with patient & she said her night sweats have stopped. Pt has jury duty next week so she scheduled an ov for f/u of her meds with PG on 02-27-14 at 12:45. Pt will need a refill until her appt.

## 2014-02-27 ENCOUNTER — Ambulatory Visit (INDEPENDENT_AMBULATORY_CARE_PROVIDER_SITE_OTHER): Payer: 59 | Admitting: Nurse Practitioner

## 2014-02-27 ENCOUNTER — Encounter: Payer: Self-pay | Admitting: Nurse Practitioner

## 2014-02-27 VITALS — BP 118/70 | HR 76 | Ht 68.75 in | Wt 166.0 lb

## 2014-02-27 DIAGNOSIS — N951 Menopausal and female climacteric states: Secondary | ICD-10-CM

## 2014-02-27 LAB — ESTRADIOL: Estradiol: 26 pg/mL

## 2014-02-27 MED ORDER — MEDROXYPROGESTERONE ACETATE 5 MG PO TABS: 5.0000 mg | ORAL_TABLET | Freq: Every day | ORAL | Status: DC

## 2014-02-27 MED ORDER — ESTRADIOL 0.5 MG PO TABS: 0.5000 mg | ORAL_TABLET | Freq: Every day | ORAL | Status: DC

## 2014-02-27 NOTE — Patient Instructions (Signed)

## 2014-02-27 NOTE — Progress Notes (Signed)
Patient ID: Beverly Cole, female   DOB: 06-25-1960, 54 y.o.   MRN: [id number] S: This 54 yo G0 presents for a consult visit about HRT.  She started on HRT with her last AEX in April 2015.  She was given Estradiol 0.5 mg and Provera 5 mg daily and really feels this has helped.   She was to return in 6 months for a follow  But never could coordinate time with her schedule and mine.  She did have a bad episode of vaso symptoms in July that lasted until first of October.  She wondered if her HRT was enough. Overall the brain fog, vaginal dryness, mood and sleep is helped with HRT. At the same time her aunt was having some health issues.  Then her father in law became ill and passed.  Her mammo normal done 09/19/13.   A: HRT replacement since April 2015  Normal Mammo  Plan:  Will check estradiol level and follow  Continue on current dose for now  Refill given for Estradiol 0.5 mg and Provera 5 mg daily  AEX is planned for April

## 2014-02-28 NOTE — Progress Notes (Signed)
Encounter reviewed by Dr. Kallan Merrick Silva.  

## 2014-03-07 ENCOUNTER — Encounter: Payer: Self-pay | Admitting: Nurse Practitioner

## 2014-03-10 ENCOUNTER — Other Ambulatory Visit: Payer: Self-pay | Admitting: Nurse Practitioner

## 2014-03-10 MED ORDER — ESTRADIOL 1 MG PO TABS: 1.0000 mg | ORAL_TABLET | Freq: Every day | ORAL | Status: DC

## 2014-04-13 ENCOUNTER — Telehealth: Payer: Self-pay | Admitting: Family Medicine

## 2014-04-13 MED ORDER — DOXYCYCLINE 40 MG PO CPDR: 40.0000 mg | DELAYED_RELEASE_CAPSULE | Freq: Every day | ORAL | Status: DC

## 2014-04-13 NOTE — Telephone Encounter (Signed)
Caller name: Sharmane, Dame Relation to pt: self  Call back number: [phone] Pharmacy: [place], [place]. [phone] (Phone) [phone] (Fax)         Reason for call:  Pt requesting a refill of doxycycline (ORACEA) 40 MG capsule. Pt also wanted to know would you like her to schedule appointment please advise.

## 2014-04-13 NOTE — Telephone Encounter (Signed)
She is due for her 6 month follow up.     KP

## 2014-04-18 NOTE — Telephone Encounter (Signed)
lvm awaiting call back

## 2014-05-16 ENCOUNTER — Encounter: Payer: Self-pay | Admitting: Family Medicine

## 2014-05-16 ENCOUNTER — Ambulatory Visit (INDEPENDENT_AMBULATORY_CARE_PROVIDER_SITE_OTHER): Payer: 59 | Admitting: Family Medicine

## 2014-05-16 VITALS — BP 126/74 | HR 94 | Temp 97.8°F | Resp 18 | Ht 69.0 in | Wt 169.0 lb

## 2014-05-16 DIAGNOSIS — Z111 Encounter for screening for respiratory tuberculosis: Secondary | ICD-10-CM

## 2014-05-16 DIAGNOSIS — E042 Nontoxic multinodular goiter: Secondary | ICD-10-CM

## 2014-05-16 DIAGNOSIS — E041 Nontoxic single thyroid nodule: Secondary | ICD-10-CM | POA: Diagnosis not present

## 2014-05-16 NOTE — Progress Notes (Signed)
Subjective:    Patient ID: Beverly Cole, female    DOB: 1960-03-03, 54 y.o.   MRN: [id number]  HPI  Patient here for f/u thyroid.   She also needs a PPD>    Past Medical History  Diagnosis Date  . DEPRESSION   . GERD   . HYPERLIPIDEMIA   . MYALGIA   . BIPOLAR DISORDER UNSPECIFIED   . IRRITABLE BOWEL SYNDROME, HX OF   . Hypertension   . Atrial tachycardia   . Hypertension   . Headache(784.0)     usually with cycle  . Bursitis of hip 09/1999    Bilateral - Dr. Berenice Primas  . Shortness of breath     Pulmonary eval 05/2002  . Mitral valve prolapse 12/17/2000    cardiolyte study 12/2000 neg  . Fistula of female genitalia 03/29/2007    enterovesical fistula, not reapired  . Anxiety   . Menopausal state 01/2012    FSH = 88.5  . Thyroid nodule 2014    Review of Systems  Constitutional: Negative for activity change, appetite change, fatigue and unexpected weight change.  Respiratory: Negative for cough and shortness of breath.   Cardiovascular: Negative for chest pain and palpitations.  Psychiatric/Behavioral: Negative for behavioral problems and dysphoric mood. The patient is not nervous/anxious.     Current Outpatient Prescriptions on File Prior to Visit  Medication Sig Dispense Refill  . buPROPion (WELLBUTRIN XL) 150 MG 24 hr tablet Take 150 mg by mouth at bedtime.     Marland Kitchen CARTIA XT 180 MG 24 hr capsule TAKE 1 CAPSULE (180 MG TOTAL) BY MOUTH DAILY. 90 capsule 1  . Cholecalciferol (VITAMIN D3) 1000 UNITS CAPS Take 1,000 Units by mouth at bedtime.     . divalproex (DEPAKOTE) 500 MG 24 hr tablet Take 1,500 mg by mouth at bedtime.     Marland Kitchen doxycycline (ORACEA) 40 MG capsule Take 1 capsule (40 mg total) by mouth daily. 90 capsule 0  . esomeprazole (NEXIUM) 40 MG capsule TAKE 1 CAPSULE (40 MG TOTAL) BY MOUTH AT BEDTIME. 90 capsule 3  . estradiol (ESTRACE) 1 MG tablet Take 1 tablet (1 mg total) by mouth daily. 90 tablet 4  . FINACEA 15 % FOAM as needed.  3  . fluticasone (FLONASE) 50  MCG/ACT nasal spray Place 2 sprays into the nose daily as needed. For allergies    . lidocaine (XYLOCAINE) 2 % jelly Apply topically as needed (for dysparunia). 30 mL 12  . medroxyPROGESTERone (PROVERA) 5 MG tablet Take 1 tablet (5 mg total) by mouth daily. 90 tablet 0  . nitrofurantoin, macrocrystal-monohydrate, (MACROBID) 100 MG capsule Take 1 capsule (100 mg total) by mouth once as needed (post coital). 30 capsule 6  . NONFORMULARY OR COMPOUNDED ITEM Use twice weekly 60 each 2  . NONFORMULARY OR COMPOUNDED ITEM Testosterone propionate 2% in white petrolatum, apply bid for 6 weeks and then daily as directed.  60 grams. 60 each 1  . polyethylene glycol powder (GLYCOLAX/MIRALAX) powder Take 17 g by mouth daily. (Patient not taking: Reported on 02/27/2014) 255 g 11   No current facility-administered medications on file prior to visit.       Objective:    Physical Exam  Constitutional: She is oriented to person, place, and time. She appears well-developed and well-nourished. No distress.  HENT:  Right Ear: External ear normal.  Left Ear: External ear normal.  Nose: Nose normal.  Mouth/Throat: Oropharynx is clear and moist.  Eyes: EOM are normal. Pupils are  equal, round, and reactive to light.  Neck: Normal range of motion. Neck supple.  Cardiovascular: Normal rate, regular rhythm and normal heart sounds.   No murmur heard. Pulmonary/Chest: Effort normal and breath sounds normal. No respiratory distress. She has no wheezes. She has no rales. She exhibits no tenderness.  Neurological: She is alert and oriented to person, place, and time.  Psychiatric: She has a normal mood and affect. Her behavior is normal. Judgment and thought content normal.    BP 126/74 mmHg  Pulse 94  Temp(Src) 97.8 F (36.6 C) (Oral)  Resp 18  Ht 5\' 9"  (1.753 m)  Wt 169 lb (76.658 kg)  BMI 24.95 kg/m2  SpO2 99%  LMP 05/11/2012 Wt Readings from Last 3 Encounters:  05/16/14 169 lb (76.658 kg)  02/27/14 166 lb  (75.297 kg)  10/27/13 168 lb 6.9 oz (76.4 kg)     Lab Results  Component Value Date   WBC 6.6 11/02/2013   HGB 13.9 11/02/2013   HCT 41.6 11/02/2013   PLT 215.0 11/02/2013   GLUCOSE 83 11/02/2013   CHOL 240* 11/02/2013   TRIG 191.0* 11/02/2013   HDL 43.40 11/02/2013   LDLDIRECT 166.5 03/18/2013   LDLCALC 158* 11/02/2013   ALT 16 11/02/2013   AST 21 11/02/2013   NA 137 11/02/2013   K 3.1* 11/02/2013   CL 104 11/02/2013   CREATININE 0.9 11/02/2013   BUN 10 11/02/2013   CO2 25 11/02/2013   TSH 1.58 11/02/2013   HGBA1C 5.3 03/20/2009   MICROALBUR 0.6 05/19/2011       Assessment & Plan:   Problem List Items Addressed This Visit    Multinodular goiter    Pt is not having any symptoms of thyroid issues She will wait on [place] and any other labs       Other Visit Diagnoses    Thyroid nodule    -  Primary    Encounter for PPD test        Screening-pulmonary TB        Relevant Orders    PPD (Completed)       I am having Ms. Altergott maintain her divalproex, fluticasone, Vitamin D3, buPROPion, lidocaine, polyethylene glycol powder, NONFORMULARY OR COMPOUNDED ITEM, nitrofurantoin (macrocrystal-monohydrate), NONFORMULARY OR COMPOUNDED ITEM, esomeprazole, CARTIA XT, FINACEA, medroxyPROGESTERone, estradiol, and doxycycline.  No orders of the defined types were placed in this encounter.     Garnet Koyanagi, DO

## 2014-05-16 NOTE — Assessment & Plan Note (Signed)
Pt is not having any symptoms of thyroid issues She will wait on [place] and any other labs

## 2014-05-16 NOTE — Patient Instructions (Signed)
If your swallowing or couging is worse let us know and we will get you in to recheck thyroid

## 2014-05-16 NOTE — Progress Notes (Signed)
Pre visit review using our clinic review tool, if applicable. No additional management support is needed unless otherwise documented below in the visit note. 

## 2014-05-18 LAB — TB SKIN TEST
Induration: 0 mm
TB Skin Test: NEGATIVE

## 2014-06-01 ENCOUNTER — Ambulatory Visit (INDEPENDENT_AMBULATORY_CARE_PROVIDER_SITE_OTHER): Payer: 59 | Admitting: Nurse Practitioner

## 2014-06-01 ENCOUNTER — Encounter: Payer: Self-pay | Admitting: Nurse Practitioner

## 2014-06-01 ENCOUNTER — Ambulatory Visit: Payer: 59 | Admitting: Nurse Practitioner

## 2014-06-01 VITALS — BP 124/76 | HR 68 | Ht 69.0 in | Wt 170.0 lb

## 2014-06-01 DIAGNOSIS — Z1211 Encounter for screening for malignant neoplasm of colon: Secondary | ICD-10-CM

## 2014-06-01 DIAGNOSIS — N952 Postmenopausal atrophic vaginitis: Secondary | ICD-10-CM | POA: Diagnosis not present

## 2014-06-01 DIAGNOSIS — Z0000 Encounter for general adult medical examination without abnormal findings: Secondary | ICD-10-CM

## 2014-06-01 DIAGNOSIS — Z01419 Encounter for gynecological examination (general) (routine) without abnormal findings: Secondary | ICD-10-CM | POA: Diagnosis not present

## 2014-06-01 MED ORDER — LIDOCAINE HCL 2 % EX GEL: CUTANEOUS | Status: DC | PRN

## 2014-06-01 MED ORDER — MEDROXYPROGESTERONE ACETATE 5 MG PO TABS: 5.0000 mg | ORAL_TABLET | Freq: Every day | ORAL | Status: DC

## 2014-06-01 MED ORDER — NITROFURANTOIN MONOHYD MACRO 100 MG PO CAPS: 100.0000 mg | ORAL_CAPSULE | Freq: Once | ORAL | Status: DC | PRN

## 2014-06-01 MED ORDER — ESTRADIOL 1 MG PO TABS: 1.0000 mg | ORAL_TABLET | Freq: Every day | ORAL | Status: DC

## 2014-06-01 MED ORDER — ESTRADIOL 0.1 MG/GM VA CREA: TOPICAL_CREAM | VAGINAL | Status: DC

## 2014-06-01 NOTE — Patient Instructions (Signed)

## 2014-06-01 NOTE — Progress Notes (Signed)
Patient ID: Beverly Cole, female   DOB: 1960-11-05, 54 y.o.   MRN: [id number] 54 y.o. G0P0 Married  Caucasian Fe here for annual exam.  Now an increase in vaginal dryness despite use of Xylocaine jelly.  Not using Testosterone at all.  She would like to add estrogen vaginal cream to her oral HRT regimen.  Patient's last menstrual period was 05/11/2012.          Sexually active: Yes.    The current method of family planning is post menopausal status.    Exercising: No.  The patient does not participate in regular exercise at present. Smoker:  no  Health Maintenance: Pap:  05/13/11, negative with neg HR HPV (wants pap Yearly)  (Remote CIN II with cryo 1982) MMG:  09/19/13, 3D, Bi-Rads 1:  Negative Colonoscopy:  10/27/05, normal, repeat in 10 years, Dr. Collene Mares IFOB given BMD:   Never   TDaP:  05/13/11 Labs:  PCP in [facility] 10/2013   reports that she quit smoking about 18 years ago. Her smoking use included Cigarettes. She has a 25 pack-year smoking history. She has never used smokeless tobacco. She reports that she drinks about 1.5 oz of alcohol per week. She reports that she does not use illicit drugs.  Past Medical History  Diagnosis Date  . DEPRESSION   . GERD   . HYPERLIPIDEMIA   . MYALGIA   . BIPOLAR DISORDER UNSPECIFIED   . IRRITABLE BOWEL SYNDROME, HX OF   . Hypertension   . Atrial tachycardia   . Hypertension   . Headache(784.0)     usually with cycle  . Bursitis of hip 09/1999    Bilateral - Dr. Berenice Primas  . Shortness of breath     Pulmonary eval 05/2002  . Mitral valve prolapse 12/17/2000    cardiolyte study 12/2000 neg  . Fistula of female genitalia 03/29/2007    enterovesical fistula, not reapired  . Anxiety   . Menopausal state 01/2012    FSH = 88.5  . Thyroid nodule 2014    Past Surgical History  Procedure Laterality Date  . Lipoma (r) side  1990    Right back    Current Outpatient Prescriptions  Medication Sig Dispense Refill  . buPROPion (WELLBUTRIN XL) 150 MG 24 hr  tablet Take 150 mg by mouth at bedtime.     Marland Kitchen CARTIA XT 180 MG 24 hr capsule TAKE 1 CAPSULE (180 MG TOTAL) BY MOUTH DAILY. 90 capsule 1  . Cholecalciferol (VITAMIN D3) 1000 UNITS CAPS Take 1,000 Units by mouth at bedtime.     . divalproex (DEPAKOTE) 500 MG 24 hr tablet Take 1,500 mg by mouth at bedtime.     Marland Kitchen doxycycline (ORACEA) 40 MG capsule Take 1 capsule (40 mg total) by mouth daily. 90 capsule 0  . esomeprazole (NEXIUM) 40 MG capsule TAKE 1 CAPSULE (40 MG TOTAL) BY MOUTH AT BEDTIME. 90 capsule 3  . estradiol (ESTRACE) 0.1 MG/GM vaginal cream Use 1/2 g vaginally three times a week 42.5 g 3  . estradiol (ESTRACE) 1 MG tablet Take 1 tablet (1 mg total) by mouth daily. 90 tablet 4  . FINACEA 15 % FOAM as needed.  3  . fluticasone (FLONASE) 50 MCG/ACT nasal spray Place 2 sprays into the nose daily as needed. For allergies    . lidocaine (XYLOCAINE) 2 % jelly Apply topically as needed (for dysparunia). 30 mL 12  . medroxyPROGESTERone (PROVERA) 5 MG tablet Take 1 tablet (5 mg total) by mouth daily.  90 tablet 3  . nitrofurantoin, macrocrystal-monohydrate, (MACROBID) 100 MG capsule Take 1 capsule (100 mg total) by mouth once as needed (post coital). 30 capsule 6  . NONFORMULARY OR COMPOUNDED ITEM Use twice weekly 60 each 2  . NONFORMULARY OR COMPOUNDED ITEM Testosterone propionate 2% in white petrolatum, apply bid for 6 weeks and then daily as directed.  60 grams. 60 each 1  . Probiotic Product (PROBIOTIC DAILY PO) Take 1 tablet by mouth daily at 6 (six) AM.     No current facility-administered medications for this visit.    Family History  Problem Relation Age of Onset  . Lung cancer Mother   . Hypertension Mother   . Hyperlipidemia Mother   . Cancer Father     Esophageal cancer  . Hyperlipidemia Father   . Colon cancer Paternal Uncle   . Sarcoidosis Other   . Testicular cancer Other     ROS:  Pertinent items are noted in HPI.  Otherwise, a comprehensive ROS was negative.  Exam:   BP  124/76 mmHg  Pulse 68  Ht 5\' 9"  (1.753 m)  Wt 170 lb (77.111 kg)  BMI 25.09 kg/m2  LMP 05/11/2012 Height: 5\' 9"  (175.3 cm) Ht Readings from Last 3 Encounters:  06/01/14 5\' 9"  (1.753 m)  05/16/14 5\' 9"  (1.753 m)  02/27/14 5' 8.75" (1.746 m)    General appearance: alert, cooperative and appears stated age Head: Normocephalic, without obvious abnormality, atraumatic Neck: no adenopathy, supple, symmetrical, trachea midline and thyroid normal to inspection and palpation Lungs: clear to auscultation bilaterally Breasts: normal appearance, no masses or tenderness Heart: regular rate and rhythm Abdomen: soft, non-tender; no masses,  no organomegaly Extremities: extremities normal, atraumatic, no cyanosis or edema Skin: Skin color, texture, turgor normal. No rashes or lesions Lymph nodes: Cervical, supraclavicular, and axillary nodes normal. No abnormal inguinal nodes palpated Neurologic: Grossly normal   Pelvic: External genitalia:  no lesions              Urethra:  normal appearing urethra with no masses, tenderness or lesions              Bartholin's and Skene's: normal                 Vagina: normal appearing vagina with normal color and discharge, no lesions              Cervix: anteverted              Pap taken: Yes.    Wants pap yearly Bimanual Exam:  Uterus:  normal size, contour, position, consistency, mobility, non-tender              Adnexa: no mass, fullness, tenderness               Rectovaginal: Confirms               Anus:  normal sphincter tone, no lesions  Chaperone present:  yes  A:  Well Woman with normal exam  Postmenopausal with HRT 05/2013  Atrophic vaginitis HTN, history of small thyroid nodule Situational depression with episodes of anxiety  P:   Reviewed health and wellness pertinent to exam  Pap smear taken today  Mammogram is due 09/2014  Refill on Macrobid, Xylocaine jelly, Provera, Estradiol 1 mg for a year  Did not need  Testerone at this time  Counseled with risk of DVT, CVA, cancer, etc.  IFOB given  Counseled on breast self exam, mammography screening, use and  side effects of HRT, adequate intake of calcium and vitamin D, diet and exercise, Kegel's exercises return annually or prn  An After Visit Summary was printed and given to the patient.

## 2014-06-04 NOTE — Progress Notes (Signed)
Encounter reviewed by Dr. Josefa Half. Rx for Estrace vaginal cream also given to patient.

## 2014-06-07 LAB — IPS PAP TEST WITH HPV

## 2014-06-21 ENCOUNTER — Ambulatory Visit: Payer: 59 | Admitting: Cardiovascular Disease

## 2014-06-30 ENCOUNTER — Ambulatory Visit (INDEPENDENT_AMBULATORY_CARE_PROVIDER_SITE_OTHER): Payer: 59 | Admitting: Cardiovascular Disease

## 2014-06-30 ENCOUNTER — Encounter: Payer: Self-pay | Admitting: Cardiovascular Disease

## 2014-06-30 VITALS — BP 122/80 | HR 78 | Ht 69.0 in | Wt 171.4 lb

## 2014-06-30 DIAGNOSIS — I340 Nonrheumatic mitral (valve) insufficiency: Secondary | ICD-10-CM

## 2014-06-30 DIAGNOSIS — I471 Supraventricular tachycardia: Secondary | ICD-10-CM | POA: Diagnosis not present

## 2014-06-30 NOTE — Patient Instructions (Signed)
Medication Instructions:  Your physician recommends that you continue on your current medications as directed. Please refer to the Current Medication list given to you today.   Labwork: none  Testing/Procedures: none  Follow-Up: Your physician wants you to follow-up in: 24 months.  You will receive a reminder letter in the mail two months in advance. If you don't receive a letter, please call our office to schedule the follow-up appointment.

## 2014-06-30 NOTE — Progress Notes (Signed)
Chief Complaint  Patient presents with  . Chest Pain     History of Present Illness: 54 yo WF with history of HLD, HTN, Bipolar disorder, IBS, Mitral valve prolapse here today for cardiac follow up. She was seen as a new patient 10/08/11 to establish cardiology care. She was diagnosed with MVP in the 1980s. She told me that she had been having sharp pains in her chest, occasionally associated with SOB. These episodes lasted for a few minutes. She was seen in the ED at [facility] 08/11/11 and had a coronary CT 08/12/11 which showed normal coronary arteries. Also notes skipped heart beats several times per week. I ordered an echo which showed normal LV size and function, mild MR. A 48 hour Holter monitor showed NSR with bradycardia, PACs and runs of SVT with short run of atrial fibrillation. She was seen in EP clinic by Dr. Rayann Heman October 2013 and he felt that this was most likely a long RP tachycardia. He did not recommend a change in therapy but suggested Flecainide if symptoms worsened and EP f/u only if there were changes. She was last seen in EP clinic in January 2014. She felt that her Toprol was causing hallucinations so she was started on Cardizem and Toprol stopped November 2014.   She is here for follow up. She tells me that she has had no palpitations. She has occasional chest pain when lying on her left side. No exertional chest pain. No SOB, near syncope.    Primary Care Physician: Tam, Savoia  Last Lipid Profile:Lipid Panel     Component Value Date/Time   CHOL 240* 11/02/2013 1045   TRIG 191.0* 11/02/2013 1045   HDL 43.40 11/02/2013 1045   CHOLHDL 6 11/02/2013 1045   VLDL 38.2 11/02/2013 1045   LDLCALC 158* 11/02/2013 1045     Past Medical History  Diagnosis Date  . DEPRESSION   . GERD   . HYPERLIPIDEMIA   . MYALGIA   . BIPOLAR DISORDER UNSPECIFIED   . IRRITABLE BOWEL SYNDROME, HX OF   . Hypertension   . Atrial tachycardia   . Hypertension   . Headache(784.0)     usually  with cycle  . Bursitis of hip 09/1999    Bilateral - Dr. Berenice Primas  . Shortness of breath     Pulmonary eval 05/2002  . Mitral valve prolapse 12/17/2000    cardiolyte study 12/2000 neg  . Fistula of female genitalia 03/29/2007    enterovesical fistula, not reapired  . Anxiety   . Menopausal state 01/2012    FSH = 88.5  . Thyroid nodule 2014    Past Surgical History  Procedure Laterality Date  . Lipoma (r) side  1990    Right back    Current Outpatient Prescriptions  Medication Sig Dispense Refill  . buPROPion (WELLBUTRIN XL) 150 MG 24 hr tablet Take 150 mg by mouth at bedtime.     Marland Kitchen CARTIA XT 180 MG 24 hr capsule TAKE 1 CAPSULE (180 MG TOTAL) BY MOUTH DAILY. 90 capsule 1  . Cholecalciferol (VITAMIN D3) 1000 UNITS CAPS Take 1,000 Units by mouth at bedtime.     . divalproex (DEPAKOTE) 500 MG 24 hr tablet Take 1,500 mg by mouth at bedtime.     Marland Kitchen doxycycline (ORACEA) 40 MG capsule Take 1 capsule (40 mg total) by mouth daily. 90 capsule 0  . esomeprazole (NEXIUM) 40 MG capsule TAKE 1 CAPSULE (40 MG TOTAL) BY MOUTH AT BEDTIME. 90 capsule 3  .  estradiol (ESTRACE) 0.1 MG/GM vaginal cream Use 1/2 g vaginally three times a week 42.5 g 3  . estradiol (ESTRACE) 1 MG tablet Take 1 tablet (1 mg total) by mouth daily. 90 tablet 4  . FINACEA 15 % FOAM Apply 1 application topically daily as needed (skin).   3  . fluticasone (FLONASE) 50 MCG/ACT nasal spray Place 2 sprays into the nose daily as needed. For allergies    . lidocaine (XYLOCAINE) 2 % jelly Apply topically as needed (for dysparunia). (Patient taking differently: Apply 1 application topically daily as needed (for dysparunia). ) 30 mL 12  . medroxyPROGESTERone (PROVERA) 5 MG tablet Take 1 tablet (5 mg total) by mouth daily. 90 tablet 3  . nitrofurantoin, macrocrystal-monohydrate, (MACROBID) 100 MG capsule Take 1 capsule (100 mg total) by mouth once as needed (post coital). 30 capsule 6  . NONFORMULARY OR COMPOUNDED ITEM Testosterone propionate  2% in white petrolatum, apply bid for 6 weeks and then daily as directed.  60 grams. 60 each 1  . Probiotic Product (PROBIOTIC DAILY PO) Take 1 tablet by mouth daily at 6 (six) AM.     No current facility-administered medications for this visit.    Allergies  Allergen Reactions  . Lopressor [Metoprolol]     Hallucinations hair falls out    History   Social History  . Marital Status: Married    Spouse Name: N/A  . Number of Children: 0  . Years of Education: N/A   Occupational History  . 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 Smoking status: Former Smoker -- 1.00 packs/day for 25 years    Types: Cigarettes    Quit date: 02/11/1996  . Smokeless tobacco: Never Used     Comment: Married, lives with spouse. 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 Alcohol Use: 1.5 oz/week    3 drink(s) per week  . Drug Use: No  . Sexual Activity:    Partners: Male    Birth Control/ Protection: Post-menopausal   Other Topics Concern  . Not on file   Social History Narrative   Married, lives with spouse in [place]. 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 Lung cancer Mother   . Hypertension Mother   . Hyperlipidemia Mother   . Cancer Father     Esophageal cancer  . Hyperlipidemia Father   . Colon cancer Paternal Uncle   . Sarcoidosis Other   . Testicular cancer Other     Review of Systems:  As stated in the HPI and otherwise negative.   BP 122/80 mmHg  Pulse 78  Ht 5\' 9"  (1.753 m)  Wt 171 lb 6.4 oz (77.747 kg)  BMI 25.30 kg/m2  LMP 05/11/2012  Physical Examination: General: Well developed, well nourished, NAD HEENT: OP clear, mucus membranes moist SKIN: warm, dry. No rashes. Neuro: No focal deficits Musculoskeletal: Muscle strength 5/5 all ext Psychiatric: Mood and affect normal Neck: No JVD, no carotid bruits, no thyromegaly, no lymphadenopathy. Lungs:Clear bilaterally, no wheezes, rhonci,  crackles Cardiovascular: Regular rate and rhythm. No murmurs, gallops or rubs. Abdomen:Soft. Bowel sounds present. Non-tender.  Extremities: No lower extremity edema. Pulses are 2 + in the bilateral DP/PT.  EKG:  EKG is ordered today. The ekg ordered today demonstrates NSR, rate 78 bpm  Recent Labs: 11/02/2013: ALT 16; BUN 10; Creatinine 0.9; Hemoglobin 13.9; Platelets 215.0; Potassium 3.1*; Sodium 137; TSH  1.58   Lipid Panel    Component Value Date/Time   CHOL 240* 11/02/2013 1045   TRIG 191.0* 11/02/2013 1045   HDL 43.40 11/02/2013 1045   CHOLHDL 6 11/02/2013 1045   VLDL 38.2 11/02/2013 1045   LDLCALC 158* 11/02/2013 1045   LDLDIRECT 166.5 03/18/2013 1421     Wt Readings from Last 3 Encounters:  06/30/14 171 lb 6.4 oz (77.747 kg)  06/01/14 170 lb (77.111 kg)  05/16/14 169 lb (76.658 kg)     Other studies Reviewed: Additional studies/ records that were reviewed today include: . Review of the above records demonstrates:    Assessment and Plan:   1. SVT: She is known to have a long RP tachycardia. Tolerating Cardizem.    2. Mitral regurgitation: mild by echo September 2013.   3. Chest pain: Atypical. Occurs only when lying on left side. Does not sound cardiac.   Current medicines are reviewed at length with the patient today.  The patient does not have concerns regarding medicines.  The following changes have been made:  no change  Labs/ tests ordered today include:   Orders Placed This Encounter  Procedures  . EKG 12-Lead    Disposition:   FU with me in 24 months  Signed, Lauree Chandler, MD 06/30/2014 5:07 PM    [facility] [address], [place], [place]  [address] Phone: [phone]; Fax: [phone]

## 2014-07-12 ENCOUNTER — Telehealth: Payer: Self-pay | Admitting: *Deleted

## 2014-07-12 ENCOUNTER — Other Ambulatory Visit: Payer: Self-pay | Admitting: *Deleted

## 2014-07-12 MED ORDER — DILTIAZEM HCL ER COATED BEADS 180 MG PO CP24: ORAL_CAPSULE | ORAL | Status: DC

## 2014-07-12 NOTE — Telephone Encounter (Signed)
Spoke with out pt pharmacy at [facility] and pt has always received generic.  Pharmacy reports name may change depending on generic they are able to get at cheapest price. Pharmacy reports they are unable to get brand name Cartia. I placed call to pt to discuss. Left message to call back

## 2014-07-12 NOTE — Telephone Encounter (Signed)
Patient called and stated that the brand name cartia xt is too expensive and would like to switch back to the generic. She tells me that she had experienced some palpitations from the generic. Please advise. Thanks, MI

## 2014-07-12 NOTE — Telephone Encounter (Signed)
Spoke with pt. She reports at last office visit she had talked with Dr.McAlhany about getting name brand Cartia but due to cost she would like generic sent in. Pt made aware she has always received generic and that I would send refill for 90 day supply to pharmacy

## 2014-07-21 ENCOUNTER — Other Ambulatory Visit: Payer: Self-pay | Admitting: Family Medicine

## 2014-07-21 NOTE — Telephone Encounter (Signed)
Last seen 05/16/14 and filled 04/13/14 #90   Please advise     KP

## 2014-07-25 LAB — FECAL OCCULT BLOOD, IMMUNOCHEMICAL: IFOBT: NEGATIVE

## 2014-07-25 NOTE — Addendum Note (Signed)
Addended by: Lowella Fairy on: 07/25/2014 11:43 AM   Modules accepted: Orders

## 2014-08-31 ENCOUNTER — Other Ambulatory Visit: Payer: Self-pay

## 2014-08-31 DIAGNOSIS — Z1231 Encounter for screening mammogram for malignant neoplasm of breast: Secondary | ICD-10-CM

## 2014-10-05 ENCOUNTER — Ambulatory Visit
Admission: RE | Admit: 2014-10-05 | Discharge: 2014-10-05 | Disposition: A | Discharge status: Home / Self Care | Payer: 59 | Source: Ambulatory Visit

## 2014-10-05 DIAGNOSIS — Z1231 Encounter for screening mammogram for malignant neoplasm of breast: Secondary | ICD-10-CM

## 2014-10-30 ENCOUNTER — Encounter: Payer: Self-pay | Admitting: Behavioral Health

## 2014-10-30 ENCOUNTER — Telehealth: Payer: Self-pay | Admitting: Behavioral Health

## 2014-10-30 NOTE — Addendum Note (Signed)
Addended by: Eduard Roux E on: 10/30/2014 04:09 PM   Modules accepted: Medications

## 2014-10-30 NOTE — Telephone Encounter (Signed)
Pre-Visit Call completed with patient and chart updated.   Pre-Visit Info documented in Specialty Comments under SnapShot.    

## 2014-10-30 NOTE — Telephone Encounter (Signed)
Unable to reach patient at time of Pre-Visit Call.  Left message for patient to return call when available.    

## 2014-10-31 ENCOUNTER — Ambulatory Visit (INDEPENDENT_AMBULATORY_CARE_PROVIDER_SITE_OTHER): Payer: 59 | Admitting: Family Medicine

## 2014-10-31 ENCOUNTER — Encounter: Payer: Self-pay | Admitting: Family Medicine

## 2014-10-31 VITALS — BP 110/74 | HR 70 | Temp 98.0°F | Ht 69.0 in | Wt 175.8 lb

## 2014-10-31 DIAGNOSIS — Z0000 Encounter for general adult medical examination without abnormal findings: Secondary | ICD-10-CM | POA: Diagnosis not present

## 2014-10-31 DIAGNOSIS — E785 Hyperlipidemia, unspecified: Secondary | ICD-10-CM

## 2014-10-31 DIAGNOSIS — E042 Nontoxic multinodular goiter: Secondary | ICD-10-CM | POA: Diagnosis not present

## 2014-10-31 DIAGNOSIS — E039 Hypothyroidism, unspecified: Secondary | ICD-10-CM | POA: Diagnosis not present

## 2014-10-31 DIAGNOSIS — I10 Essential (primary) hypertension: Secondary | ICD-10-CM

## 2014-10-31 DIAGNOSIS — Z23 Encounter for immunization: Secondary | ICD-10-CM

## 2014-10-31 DIAGNOSIS — F3181 Bipolar II disorder: Secondary | ICD-10-CM

## 2014-10-31 MED ORDER — FLUCONAZOLE 150 MG PO TABS: 150.0000 mg | ORAL_TABLET | Freq: Once | ORAL | Status: DC

## 2014-10-31 NOTE — Progress Notes (Signed)
Pre visit review using our clinic review tool, if applicable. No additional management support is needed unless otherwise documented below in the visit note. 

## 2014-10-31 NOTE — Progress Notes (Signed)
Subjective:     Beverly Cole is a 54 y.o. female and is here for a comprehensive physical exam. The patient reports problems - cyst on L breast-- been  there for years.  recently got bigger.  Social History   Social History  . Marital Status: Married    Spouse Name: N/A  . Number of Children: 0  . Years of Education: N/A   Occupational History  . 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 Smoking status: Former Smoker -- 1.00 packs/day for 25 years    Types: Cigarettes    Quit date: 02/11/1996  . Smokeless tobacco: Never Used     Comment: Married, lives with spouse. 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 Alcohol Use: 1.5 oz/week    3 drink(s) per week  . Drug Use: No  . Sexual Activity:    Partners: Male    Birth Control/ Protection: Post-menopausal   Other Topics Concern  . Not on file   Social History Narrative   Married, lives with spouse in [place]. Pt is a Marine scientist with private care [employer]  Topic Date Due  . Hepatitis C Screening  1960/09/29  . INFLUENZA VACCINE  09/11/2014  . HIV Screening  05/16/2015 (Originally 11/23/1975)  . COLONOSCOPY  10/28/2015  . MAMMOGRAM  10/04/2016  . PAP SMEAR  06/01/2017  . TETANUS/TDAP  05/12/2021    The following portions of the patient's history were reviewed and updated as appropriate:  She  has a past medical history of DEPRESSION; GERD; HYPERLIPIDEMIA; MYALGIA; BIPOLAR DISORDER UNSPECIFIED; IRRITABLE BOWEL SYNDROME, HX OF; Hypertension; Atrial tachycardia; Hypertension; Headache(784.0); Bursitis of hip (09/1999); Shortness of breath; Mitral valve prolapse (12/17/2000); Fistula of female genitalia (03/29/2007); Anxiety; Menopausal state (01/2012); and Thyroid nodule (2014). She  does not have any pertinent problems on file. She  has past surgical history that includes Lipoma (R) side (1990). Her family history includes Cancer in her father; Colon cancer in her paternal uncle;  Hyperlipidemia in her father and mother; Hypertension in her mother; Lung cancer in her mother; Sarcoidosis in her other; Testicular cancer in her other. She  reports that she quit smoking about 18 years ago. Her smoking use included Cigarettes. She has a 25 pack-year smoking history. She has never used smokeless tobacco. She reports that she drinks about 1.5 oz of alcohol per week. She reports that she does not use illicit drugs. She has a current medication list which includes the following prescription(s): bupropion, vitamin d3, diltiazem, divalproex, doxycycline, esomeprazole, estradiol, estradiol, finacea, fluticasone, lidocaine, medroxyprogesterone, nitrofurantoin (macrocrystal-monohydrate), NONFORMULARY OR COMPOUNDED ITEM, probiotic product, and fluconazole. Current Outpatient Prescriptions on File Prior to Visit  Medication Sig Dispense Refill  . buPROPion (WELLBUTRIN XL) 150 MG 24 hr tablet Take 150 mg by mouth at bedtime.     . Cholecalciferol (VITAMIN D3) 1000 UNITS CAPS Take 1,000 Units by mouth at bedtime.     Marland Kitchen diltiazem (CARTIA XT) 180 MG 24 hr capsule TAKE 1 CAPSULE (180 MG TOTAL) BY MOUTH DAILY. 90 capsule 3  . divalproex (DEPAKOTE) 500 MG 24 hr tablet Take 1,500 mg by mouth at bedtime.     Marland Kitchen doxycycline (ORACEA) 40 MG capsule TAKE 1 CAPSULE BY MOUTH DAILY. 90 capsule PRN  . esomeprazole (NEXIUM) 40 MG capsule TAKE 1 CAPSULE (40 MG TOTAL) BY MOUTH AT BEDTIME. 90 capsule 3  . estradiol (ESTRACE) 0.1 MG/GM vaginal cream Use 1/2 g vaginally three times a  week 42.5 g 3  . estradiol (ESTRACE) 1 MG tablet Take 1 tablet (1 mg total) by mouth daily. 90 tablet 4  . FINACEA 15 % FOAM Apply 1 application topically daily as needed (skin).   3  . fluticasone (FLONASE) 50 MCG/ACT nasal spray Place 2 sprays into the nose daily as needed. For allergies    . lidocaine (XYLOCAINE) 2 % jelly Apply topically as needed (for dysparunia). (Patient taking differently: Apply 1 application topically daily as  needed (for dysparunia). ) 30 mL 12  . medroxyPROGESTERone (PROVERA) 5 MG tablet Take 1 tablet (5 mg total) by mouth daily. 90 tablet 3  . nitrofurantoin, macrocrystal-monohydrate, (MACROBID) 100 MG capsule Take 1 capsule (100 mg total) by mouth once as needed (post coital). 30 capsule 6  . NONFORMULARY OR COMPOUNDED ITEM Testosterone propionate 2% in white petrolatum, apply bid for 6 weeks and then daily as directed.  60 grams. 60 each 1  . Probiotic Product (PROBIOTIC DAILY PO) Take 1 tablet by mouth daily at 6 (six) AM.     No current facility-administered medications on file prior to visit.   She is allergic to lopressor..  Review of Systems Review of Systems  Constitutional: Negative for activity change, appetite change and fatigue.  HENT: Negative for hearing loss, congestion, tinnitus and ear discharge.  dentist q64m Eyes: Negative for visual disturbance (see optho q1y -- vision corrected to 20/20 with glasses).  Respiratory: Negative for cough, chest tightness and shortness of breath.   Cardiovascular: Negative for chest pain, palpitations and leg swelling.  Gastrointestinal: Negative for abdominal pain, diarrhea, constipation and abdominal distention.  Genitourinary: Negative for urgency, frequency, decreased urine volume and difficulty urinating.  Musculoskeletal: Negative for back pain, arthralgias and gait problem.  Skin: Negative for color change, pallor and rash.  Neurological: Negative for dizziness, light-headedness, numbness and headaches.  Hematological: Negative for adenopathy. Does not bruise/bleed easily.  Psychiatric/Behavioral: Negative for suicidal ideas, confusion, sleep disturbance, self-injury, dysphoric mood, decreased concentration and agitation.       Objective:    BP 110/74 mmHg  Pulse 70  Temp(Src) 98 F (36.7 C) (Oral)  Ht 5\' 9"  (1.753 m)  Wt 175 lb 12.8 oz (79.742 kg)  BMI 25.95 kg/m2  SpO2 98%  LMP 05/11/2012 General appearance: alert,  cooperative, appears stated age and no distress Head: Normocephalic, without obvious abnormality, atraumatic Eyes: conjunctivae/corneas clear. PERRL, EOM's intact. Fundi benign. Ears: normal TM's and external ear canals both ears Nose: Nares normal. Septum midline. Mucosa normal. No drainage or sinus tenderness. Throat: lips, mucosa, and tongue normal; teeth and gums normal Neck: no adenopathy, no carotid bruit, no JVD, supple, symmetrical, trachea midline and thyroid not enlarged, symmetric, no tenderness/mass/nodules Back: symmetric, no curvature. ROM normal. No CVA tenderness. Lungs: clear to auscultation bilaterally Breasts: sebaceous cyst -- L breast, about 11 0'clock Heart: regular rate and rhythm, S1, S2 normal, no murmur, click, rub or gallop Abdomen: soft, non-tender; bowel sounds normal; no masses,  no organomegaly Pelvic: deferred--gyn Extremities: extremities normal, atraumatic, no cyanosis or edema Pulses: 2+ and symmetric Skin: Skin color, texture, turgor normal. No rashes or lesions Lymph nodes: Cervical, supraclavicular, and axillary nodes normal. Neurologic: Alert and oriented X 3, normal strength and tone. Normal symmetric reflexes. Normal coordination and gait    Assessment:    Healthy female exam.      Plan:    see ghm utd Check labs See After Visit Summary for Counseling Recommendations    1. Need for immunization  against influenza   - Flu Vaccine QUAD 36+ mos IM (Fluarix)  2. Essential hypertension con't cartia - CBC with Differential/Platelet - Comprehensive metabolic panel - TSH  3. Hypothyroidism, unspecified hypothyroidism type Pt is requesting to see endo again - TSH  4. Hyperlipidemia Check labs - Lipid panel  5. Multinodular goiter Pt requesting to see endo - TSH - Ambulatory referral to Endocrinology  6. Bipolar 2 disorder Per psych - Valproic Acid level  7. Preventative health care   - Hepatitis C antibody - POCT urinalysis  dipstick

## 2014-10-31 NOTE — Patient Instructions (Signed)
Preventive Care for Adults A healthy lifestyle and preventive care can promote health and wellness. Preventive health guidelines for women include the following key practices.  A routine yearly physical is a good way to check with your health care provider about your health and preventive screening. It is a chance to share any concerns and updates on your health and to receive a thorough exam.  Visit your dentist for a routine exam and preventive care every 6 months. Brush your teeth twice a day and floss once a day. Good oral hygiene prevents tooth decay and gum disease.  The frequency of eye exams is based on your age, health, family medical history, use of contact lenses, and other factors. Follow your health care provider's recommendations for frequency of eye exams.  Eat a healthy diet. Foods like vegetables, fruits, whole grains, low-fat dairy products, and lean protein foods contain the nutrients you need without too many calories. Decrease your intake of foods high in solid fats, added sugars, and salt. Eat the right amount of calories for you.Get information about a proper diet from your health care provider, if necessary.  Regular physical exercise is one of the most important things you can do for your health. Most adults should get at least 150 minutes of moderate-intensity exercise (any activity that increases your heart rate and causes you to sweat) each week. In addition, most adults need muscle-strengthening exercises on 2 or more days a week.  Maintain a healthy weight. The body mass index (BMI) is a screening tool to identify possible weight problems. It provides an estimate of body fat based on height and weight. Your health care provider can find your BMI and can help you achieve or maintain a healthy weight.For adults 20 years and older:  A BMI below 18.5 is considered underweight.  A BMI of 18.5 to 24.9 is normal.  A BMI of 25 to 29.9 is considered overweight.  A BMI of  30 and above is considered obese.  Maintain normal blood lipids and cholesterol levels by exercising and minimizing your intake of saturated fat. Eat a balanced diet with plenty of fruit and vegetables. Blood tests for lipids and cholesterol should begin at age 76 and be repeated every 5 years. If your lipid or cholesterol levels are high, you are over 50, or you are at high risk for heart disease, you may need your cholesterol levels checked more frequently.Ongoing high lipid and cholesterol levels should be treated with medicines if diet and exercise are not working.  If you smoke, find out from your health care provider how to quit. If you do not use tobacco, do not start.  Lung cancer screening is recommended for adults aged 22-80 years who are at high risk for developing lung cancer because of a history of smoking. A yearly low-dose CT scan of the lungs is recommended for people who have at least a 30-pack-year history of smoking and are a current smoker or have quit within the past 15 years. A pack year of smoking is smoking an average of 1 pack of cigarettes a day for 1 year (for example: 1 pack a day for 30 years or 2 packs a day for 15 years). Yearly screening should continue until the smoker has stopped smoking for at least 15 years. Yearly screening should be stopped for people who develop a health problem that would prevent them from having lung cancer treatment.  If you are pregnant, do not drink alcohol. If you are breastfeeding,  be very cautious about drinking alcohol. If you are not pregnant and choose to drink alcohol, do not have more than 1 drink per day. One drink is considered to be 12 ounces (355 mL) of beer, 5 ounces (148 mL) of wine, or 1.5 ounces (44 mL) of liquor.  Avoid use of street drugs. Do not share needles with anyone. Ask for help if you need support or instructions about stopping the use of drugs.  High blood pressure causes heart disease and increases the risk of  stroke. Your blood pressure should be checked at least every 1 to 2 years. Ongoing high blood pressure should be treated with medicines if weight loss and exercise do not work.  If you are 75-52 years old, ask your health care provider if you should take aspirin to prevent strokes.  Diabetes screening involves taking a blood sample to check your fasting blood sugar level. This should be done once every 3 years, after age 15, if you are within normal weight and without risk factors for diabetes. Testing should be considered at a younger age or be carried out more frequently if you are overweight and have at least 1 risk factor for diabetes.  Breast cancer screening is essential preventive care for women. You should practice "breast self-awareness." This means understanding the normal appearance and feel of your breasts and may include breast self-examination. Any changes detected, no matter how small, should be reported to a health care provider. Women in their 58s and 30s should have a clinical breast exam (CBE) by a health care provider as part of a regular health exam every 1 to 3 years. After age 16, women should have a CBE every year. Starting at age 53, women should consider having a mammogram (breast X-ray test) every year. Women who have a family history of breast cancer should talk to their health care provider about genetic screening. Women at a high risk of breast cancer should talk to their health care providers about having an MRI and a mammogram every year.  Breast cancer gene (BRCA)-related cancer risk assessment is recommended for women who have family members with BRCA-related cancers. BRCA-related cancers include breast, ovarian, tubal, and peritoneal cancers. Having family members with these cancers may be associated with an increased risk for harmful changes (mutations) in the breast cancer genes BRCA1 and BRCA2. Results of the assessment will determine the need for genetic counseling and  BRCA1 and BRCA2 testing.  Routine pelvic exams to screen for cancer are no longer recommended for nonpregnant women who are considered low risk for cancer of the pelvic organs (ovaries, uterus, and vagina) and who do not have symptoms. Ask your health care provider if a screening pelvic exam is right for you.  If you have had past treatment for cervical cancer or a condition that could lead to cancer, you need Pap tests and screening for cancer for at least 20 years after your treatment. If Pap tests have been discontinued, your risk factors (such as having a new sexual partner) need to be reassessed to determine if screening should be resumed. Some women have medical problems that increase the chance of getting cervical cancer. In these cases, your health care provider may recommend more frequent screening and Pap tests.  The HPV test is an additional test that may be used for cervical cancer screening. The HPV test looks for the virus that can cause the cell changes on the cervix. The cells collected during the Pap test can be  tested for HPV. The HPV test could be used to screen women aged 30 years and older, and should be used in women of any age who have unclear Pap test results. After the age of 30, women should have HPV testing at the same frequency as a Pap test.  Colorectal cancer can be detected and often prevented. Most routine colorectal cancer screening begins at the age of 50 years and continues through age 75 years. However, your health care provider may recommend screening at an earlier age if you have risk factors for colon cancer. On a yearly basis, your health care provider may provide home test kits to check for hidden blood in the stool. Use of a small camera at the end of a tube, to directly examine the colon (sigmoidoscopy or colonoscopy), can detect the earliest forms of colorectal cancer. Talk to your health care provider about this at age 50, when routine screening begins. Direct  exam of the colon should be repeated every 5-10 years through age 75 years, unless early forms of pre-cancerous polyps or small growths are found.  People who are at an increased risk for hepatitis B should be screened for this virus. You are considered at high risk for hepatitis B if:  You were born in a country where hepatitis B occurs often. Talk with your health care provider about which countries are considered high risk.  Your parents were born in a high-risk country and you have not received a shot to protect against hepatitis B (hepatitis B vaccine).  You have HIV or AIDS.  You use needles to inject street drugs.  You live with, or have sex with, someone who has hepatitis B.  You get hemodialysis treatment.  You take certain medicines for conditions like cancer, organ transplantation, and autoimmune conditions.  Hepatitis C blood testing is recommended for all people born from 1945 through 1965 and any individual with known risks for hepatitis C.  Practice safe sex. Use condoms and avoid high-risk sexual practices to reduce the spread of sexually transmitted infections (STIs). STIs include gonorrhea, chlamydia, syphilis, trichomonas, herpes, HPV, and human immunodeficiency virus (HIV). Herpes, HIV, and HPV are viral illnesses that have no cure. They can result in disability, cancer, and death.  You should be screened for sexually transmitted illnesses (STIs) including gonorrhea and chlamydia if:  You are sexually active and are younger than 24 years.  You are older than 24 years and your health care provider tells you that you are at risk for this type of infection.  Your sexual activity has changed since you were last screened and you are at an increased risk for chlamydia or gonorrhea. Ask your health care provider if you are at risk.  If you are at risk of being infected with HIV, it is recommended that you take a prescription medicine daily to prevent HIV infection. This is  called preexposure prophylaxis (PrEP). You are considered at risk if:  You are a heterosexual woman, are sexually active, and are at increased risk for HIV infection.  You take drugs by injection.  You are sexually active with a partner who has HIV.  Talk with your health care provider about whether you are at high risk of being infected with HIV. If you choose to begin PrEP, you should first be tested for HIV. You should then be tested every 3 months for as long as you are taking PrEP.  Osteoporosis is a disease in which the bones lose minerals and strength   with aging. This can result in serious bone fractures or breaks. The risk of osteoporosis can be identified using a bone density scan. Women ages 65 years and over and women at risk for fractures or osteoporosis should discuss screening with their health care providers. Ask your health care provider whether you should take a calcium supplement or vitamin D to reduce the rate of osteoporosis.  Menopause can be associated with physical symptoms and risks. Hormone replacement therapy is available to decrease symptoms and risks. You should talk to your health care provider about whether hormone replacement therapy is right for you.  Use sunscreen. Apply sunscreen liberally and repeatedly throughout the day. You should seek shade when your shadow is shorter than you. Protect yourself by wearing long sleeves, pants, a wide-brimmed hat, and sunglasses year round, whenever you are outdoors.  Once a month, do a whole body skin exam, using a mirror to look at the skin on your back. Tell your health care provider of new moles, moles that have irregular borders, moles that are larger than a pencil eraser, or moles that have changed in shape or color.  Stay current with required vaccines (immunizations).  Influenza vaccine. All adults should be immunized every year.  Tetanus, diphtheria, and acellular pertussis (Td, Tdap) vaccine. Pregnant women should  receive 1 dose of Tdap vaccine during each pregnancy. The dose should be obtained regardless of the length of time since the last dose. Immunization is preferred during the 27th-36th week of gestation. An adult who has not previously received Tdap or who does not know her vaccine status should receive 1 dose of Tdap. This initial dose should be followed by tetanus and diphtheria toxoids (Td) booster doses every 10 years. Adults with an unknown or incomplete history of completing a 3-dose immunization series with Td-containing vaccines should begin or complete a primary immunization series including a Tdap dose. Adults should receive a Td booster every 10 years.  Varicella vaccine. An adult without evidence of immunity to varicella should receive 2 doses or a second dose if she has previously received 1 dose. Pregnant females who do not have evidence of immunity should receive the first dose after pregnancy. This first dose should be obtained before leaving the health care facility. The second dose should be obtained 4-8 weeks after the first dose.  Human papillomavirus (HPV) vaccine. Females aged 13-26 years who have not received the vaccine previously should obtain the 3-dose series. The vaccine is not recommended for use in pregnant females. However, pregnancy testing is not needed before receiving a dose. If a female is found to be pregnant after receiving a dose, no treatment is needed. In that case, the remaining doses should be delayed until after the pregnancy. Immunization is recommended for any person with an immunocompromised condition through the age of 26 years if she did not get any or all doses earlier. During the 3-dose series, the second dose should be obtained 4-8 weeks after the first dose. The third dose should be obtained 24 weeks after the first dose and 16 weeks after the second dose.  Zoster vaccine. One dose is recommended for adults aged 60 years or older unless certain conditions are  present.  Measles, mumps, and rubella (MMR) vaccine. Adults born before 1957 generally are considered immune to measles and mumps. Adults born in 1957 or later should have 1 or more doses of MMR vaccine unless there is a contraindication to the vaccine or there is laboratory evidence of immunity to   each of the three diseases. A routine second dose of MMR vaccine should be obtained at least 28 days after the first dose for students attending postsecondary schools, health care workers, or international travelers. People who received inactivated measles vaccine or an unknown type of measles vaccine during 1963-1967 should receive 2 doses of MMR vaccine. People who received inactivated mumps vaccine or an unknown type of mumps vaccine before 1979 and are at high risk for mumps infection should consider immunization with 2 doses of MMR vaccine. For females of childbearing age, rubella immunity should be determined. If there is no evidence of immunity, females who are not pregnant should be vaccinated. If there is no evidence of immunity, females who are pregnant should delay immunization until after pregnancy. Unvaccinated health care workers born before 1957 who lack laboratory evidence of measles, mumps, or rubella immunity or laboratory confirmation of disease should consider measles and mumps immunization with 2 doses of MMR vaccine or rubella immunization with 1 dose of MMR vaccine.  Pneumococcal 13-valent conjugate (PCV13) vaccine. When indicated, a person who is uncertain of her immunization history and has no record of immunization should receive the PCV13 vaccine. An adult aged 19 years or older who has certain medical conditions and has not been previously immunized should receive 1 dose of PCV13 vaccine. This PCV13 should be followed with a dose of pneumococcal polysaccharide (PPSV23) vaccine. The PPSV23 vaccine dose should be obtained at least 8 weeks after the dose of PCV13 vaccine. An adult aged 19  years or older who has certain medical conditions and previously received 1 or more doses of PPSV23 vaccine should receive 1 dose of PCV13. The PCV13 vaccine dose should be obtained 1 or more years after the last PPSV23 vaccine dose.  Pneumococcal polysaccharide (PPSV23) vaccine. When PCV13 is also indicated, PCV13 should be obtained first. All adults aged 65 years and older should be immunized. An adult younger than age 65 years who has certain medical conditions should be immunized. Any person who resides in a nursing home or long-term care facility should be immunized. An adult smoker should be immunized. People with an immunocompromised condition and certain other conditions should receive both PCV13 and PPSV23 vaccines. People with human immunodeficiency virus (HIV) infection should be immunized as soon as possible after diagnosis. Immunization during chemotherapy or radiation therapy should be avoided. Routine use of PPSV23 vaccine is not recommended for American Indians, [place] Natives, or people younger than 65 years unless there are medical conditions that require PPSV23 vaccine. When indicated, people who have unknown immunization and have no record of immunization should receive PPSV23 vaccine. One-time revaccination 5 years after the first dose of PPSV23 is recommended for people aged 19-64 years who have chronic kidney failure, nephrotic syndrome, asplenia, or immunocompromised conditions. People who received 1-2 doses of PPSV23 before age 65 years should receive another dose of PPSV23 vaccine at age 65 years or later if at least 5 years have passed since the previous dose. Doses of PPSV23 are not needed for people immunized with PPSV23 at or after age 65 years.  Meningococcal vaccine. Adults with asplenia or persistent complement component deficiencies should receive 2 doses of quadrivalent meningococcal conjugate (MenACWY-D) vaccine. The doses should be obtained at least 2 months apart.  Microbiologists working with certain meningococcal bacteria, military recruits, people at risk during an outbreak, and people who travel to or live in countries with a high rate of meningitis should be immunized. A first-year college student up through age   21 years who is living in a residence hall should receive a dose if she did not receive a dose on or after her 16th birthday. Adults who have certain high-risk conditions should receive one or more doses of vaccine.  Hepatitis A vaccine. Adults who wish to be protected from this disease, have certain high-risk conditions, work with hepatitis A-infected animals, work in hepatitis A research labs, or travel to or work in countries with a high rate of hepatitis A should be immunized. Adults who were previously unvaccinated and who anticipate close contact with an international adoptee during the first 60 days after arrival in the [place] from a country with a high rate of hepatitis A should be immunized.  Hepatitis B vaccine. Adults who wish to be protected from this disease, have certain high-risk conditions, may be exposed to blood or other infectious body fluids, are household contacts or sex partners of hepatitis B positive people, are clients or workers in certain care facilities, or travel to or work in countries with a high rate of hepatitis B should be immunized.  Haemophilus influenzae type b (Hib) vaccine. A previously unvaccinated person with asplenia or sickle cell disease or having a scheduled splenectomy should receive 1 dose of Hib vaccine. Regardless of previous immunization, a recipient of a hematopoietic stem cell transplant should receive a 3-dose series 6-12 months after her successful transplant. Hib vaccine is not recommended for adults with HIV infection. Preventive Services / Frequency Ages 64 to 68 years  Blood pressure check.** / Every 1 to 2 years.  Lipid and cholesterol check.** / Every 5 years beginning at age  22.  Clinical breast exam.** / Every 3 years for women in their 88s and 53s.  BRCA-related cancer risk assessment.** / For women who have family members with a BRCA-related cancer (breast, ovarian, tubal, or peritoneal cancers).  Pap test.** / Every 2 years from ages 90 through 51. Every 3 years starting at age 21 through age 56 or 3 with a history of 3 consecutive normal Pap tests.  HPV screening.** / Every 3 years from ages 24 through ages 1 to 46 with a history of 3 consecutive normal Pap tests.  Hepatitis C blood test.** / For any individual with known risks for hepatitis C.  Skin self-exam. / Monthly.  Influenza vaccine. / Every year.  Tetanus, diphtheria, and acellular pertussis (Tdap, Td) vaccine.** / Consult your health care provider. Pregnant women should receive 1 dose of Tdap vaccine during each pregnancy. 1 dose of Td every 10 years.  Varicella vaccine.** / Consult your health care provider. Pregnant females who do not have evidence of immunity should receive the first dose after pregnancy.  HPV vaccine. / 3 doses over 6 months, if 72 and younger. The vaccine is not recommended for use in pregnant females. However, pregnancy testing is not needed before receiving a dose.  Measles, mumps, rubella (MMR) vaccine.** / You need at least 1 dose of MMR if you were born in 1957 or later. You may also need a 2nd dose. For females of childbearing age, rubella immunity should be determined. If there is no evidence of immunity, females who are not pregnant should be vaccinated. If there is no evidence of immunity, females who are pregnant should delay immunization until after pregnancy.  Pneumococcal 13-valent conjugate (PCV13) vaccine.** / Consult your health care provider.  Pneumococcal polysaccharide (PPSV23) vaccine.** / 1 to 2 doses if you smoke cigarettes or if you have certain conditions.  Meningococcal vaccine.** /  1 dose if you are age 19 to 21 years and a first-year college  student living in a residence hall, or have one of several medical conditions, you need to get vaccinated against meningococcal disease. You may also need additional booster doses.  Hepatitis A vaccine.** / Consult your health care provider.  Hepatitis B vaccine.** / Consult your health care provider.  Haemophilus influenzae type b (Hib) vaccine.** / Consult your health care provider. Ages 40 to 64 years  Blood pressure check.** / Every 1 to 2 years.  Lipid and cholesterol check.** / Every 5 years beginning at age 20 years.  Lung cancer screening. / Every year if you are aged 55-80 years and have a 30-pack-year history of smoking and currently smoke or have quit within the past 15 years. Yearly screening is stopped once you have quit smoking for at least 15 years or develop a health problem that would prevent you from having lung cancer treatment.  Clinical breast exam.** / Every year after age 40 years.  BRCA-related cancer risk assessment.** / For women who have family members with a BRCA-related cancer (breast, ovarian, tubal, or peritoneal cancers).  Mammogram.** / Every year beginning at age 40 years and continuing for as long as you are in good health. Consult with your health care provider.  Pap test.** / Every 3 years starting at age 30 years through age 65 or 70 years with a history of 3 consecutive normal Pap tests.  HPV screening.** / Every 3 years from ages 30 years through ages 65 to 70 years with a history of 3 consecutive normal Pap tests.  Fecal occult blood test (FOBT) of stool. / Every year beginning at age 50 years and continuing until age 75 years. You may not need to do this test if you get a colonoscopy every 10 years.  Flexible sigmoidoscopy or colonoscopy.** / Every 5 years for a flexible sigmoidoscopy or every 10 years for a colonoscopy beginning at age 50 years and continuing until age 75 years.  Hepatitis C blood test.** / For all people born from 1945 through  1965 and any individual with known risks for hepatitis C.  Skin self-exam. / Monthly.  Influenza vaccine. / Every year.  Tetanus, diphtheria, and acellular pertussis (Tdap/Td) vaccine.** / Consult your health care provider. Pregnant women should receive 1 dose of Tdap vaccine during each pregnancy. 1 dose of Td every 10 years.  Varicella vaccine.** / Consult your health care provider. Pregnant females who do not have evidence of immunity should receive the first dose after pregnancy.  Zoster vaccine.** / 1 dose for adults aged 60 years or older.  Measles, mumps, rubella (MMR) vaccine.** / You need at least 1 dose of MMR if you were born in 1957 or later. You may also need a 2nd dose. For females of childbearing age, rubella immunity should be determined. If there is no evidence of immunity, females who are not pregnant should be vaccinated. If there is no evidence of immunity, females who are pregnant should delay immunization until after pregnancy.  Pneumococcal 13-valent conjugate (PCV13) vaccine.** / Consult your health care provider.  Pneumococcal polysaccharide (PPSV23) vaccine.** / 1 to 2 doses if you smoke cigarettes or if you have certain conditions.  Meningococcal vaccine.** / Consult your health care provider.  Hepatitis A vaccine.** / Consult your health care provider.  Hepatitis B vaccine.** / Consult your health care provider.  Haemophilus influenzae type b (Hib) vaccine.** / Consult your health care provider. Ages 65   years and over  Blood pressure check.** / Every 1 to 2 years.  Lipid and cholesterol check.** / Every 5 years beginning at age 22 years.  Lung cancer screening. / Every year if you are aged 73-80 years and have a 30-pack-year history of smoking and currently smoke or have quit within the past 15 years. Yearly screening is stopped once you have quit smoking for at least 15 years or develop a health problem that would prevent you from having lung cancer  treatment.  Clinical breast exam.** / Every year after age 4 years.  BRCA-related cancer risk assessment.** / For women who have family members with a BRCA-related cancer (breast, ovarian, tubal, or peritoneal cancers).  Mammogram.** / Every year beginning at age 40 years and continuing for as long as you are in good health. Consult with your health care provider.  Pap test.** / Every 3 years starting at age 9 years through age 34 or 91 years with 3 consecutive normal Pap tests. Testing can be stopped between 65 and 70 years with 3 consecutive normal Pap tests and no abnormal Pap or HPV tests in the past 10 years.  HPV screening.** / Every 3 years from ages 57 years through ages 64 or 45 years with a history of 3 consecutive normal Pap tests. Testing can be stopped between 65 and 70 years with 3 consecutive normal Pap tests and no abnormal Pap or HPV tests in the past 10 years.  Fecal occult blood test (FOBT) of stool. / Every year beginning at age 15 years and continuing until age 17 years. You may not need to do this test if you get a colonoscopy every 10 years.  Flexible sigmoidoscopy or colonoscopy.** / Every 5 years for a flexible sigmoidoscopy or every 10 years for a colonoscopy beginning at age 86 years and continuing until age 71 years.  Hepatitis C blood test.** / For all people born from [birth year] through 1966 and any individual with known risks for hepatitis C.  Osteoporosis screening.** / A one-time screening for women ages 83 years and over and women at risk for fractures or osteoporosis.  Skin self-exam. / Monthly.  Influenza vaccine. / Every year.  Tetanus, diphtheria, and acellular pertussis (Tdap/Td) vaccine.** / 1 dose of Td every 10 years.  Varicella vaccine.** / Consult your health care provider.  Zoster vaccine.** / 1 dose for adults aged 61 years or older.  Pneumococcal 13-valent conjugate (PCV13) vaccine.** / Consult your health care provider.  Pneumococcal  polysaccharide (PPSV23) vaccine.** / 1 dose for all adults aged 28 years and older.  Meningococcal vaccine.** / Consult your health care provider.  Hepatitis A vaccine.** / Consult your health care provider.  Hepatitis B vaccine.** / Consult your health care provider.  Haemophilus influenzae type b (Hib) vaccine.** / Consult your health care provider. ** Family history and personal history of risk and conditions may change your health care provider's recommendations. Document Released: 03/25/2001 Document Revised: 06/13/2013 Document Reviewed: 06/24/2010 [facility] Patient Information 2015 [place], [place]. This information is not intended to replace advice given to you by your health care provider. Make sure you discuss any questions you have with your health care provider.

## 2014-11-01 LAB — CBC WITH DIFFERENTIAL/PLATELET
Basophils Absolute: 0 10*3/uL (ref 0.0–0.1)
Basophils Relative: 0.4 % (ref 0.0–3.0)
Eosinophils Absolute: 0.1 10*3/uL (ref 0.0–0.7)
Eosinophils Relative: 0.9 % (ref 0.0–5.0)
HCT: 43.9 % (ref 36.0–46.0)
Hemoglobin: 14.6 g/dL (ref 12.0–15.0)
Lymphocytes Relative: 25.3 % (ref 12.0–46.0)
Lymphs Abs: 2.3 10*3/uL (ref 0.7–4.0)
MCHC: 33.3 g/dL (ref 30.0–36.0)
MCV: 90.2 fl (ref 78.0–100.0)
Monocytes Absolute: 0.7 10*3/uL (ref 0.1–1.0)
Monocytes Relative: 7.1 % (ref 3.0–12.0)
Neutro Abs: 6.1 10*3/uL (ref 1.4–7.7)
Neutrophils Relative %: 66.3 % (ref 43.0–77.0)
Platelets: 265 10*3/uL (ref 150.0–400.0)
RBC: 4.87 Mil/uL (ref 3.87–5.11)
RDW: 14.5 % (ref 11.5–15.5)
WBC: 9.2 10*3/uL (ref 4.0–10.5)

## 2014-11-01 LAB — COMPREHENSIVE METABOLIC PANEL
ALT: 13 U/L (ref 0–35)
AST: 17 U/L (ref 0–37)
Albumin: 4.2 g/dL (ref 3.5–5.2)
Alkaline Phosphatase: 42 U/L (ref 39–117)
BUN: 11 mg/dL (ref 6–23)
CO2: 29 mEq/L (ref 19–32)
Calcium: 9.3 mg/dL (ref 8.4–10.5)
Chloride: 104 mEq/L (ref 96–112)
Creatinine, Ser: 0.97 mg/dL (ref 0.40–1.20)
GFR: 63.38 mL/min (ref >60.00)
Glucose, Bld: 78 mg/dL (ref 70–99)
Potassium: 3.6 mEq/L (ref 3.5–5.1)
Sodium: 141 mEq/L (ref 135–145)
Total Bilirubin: 0.4 mg/dL (ref 0.2–1.2)
Total Protein: 7.3 g/dL (ref 6.0–8.3)

## 2014-11-01 LAB — LDL CHOLESTEROL, DIRECT: Direct LDL: 212 mg/dL

## 2014-11-01 LAB — LIPID PANEL
Cholesterol: 254 mg/dL — ABNORMAL HIGH (ref 0–200)
HDL: 42.3 mg/dL (ref >39.00)
NonHDL: 211.67
Total CHOL/HDL Ratio: 6
Triglycerides: 234 mg/dL — ABNORMAL HIGH (ref 0.0–149.0)
VLDL: 46.8 mg/dL — ABNORMAL HIGH (ref 0.0–40.0)

## 2014-11-01 LAB — HEPATITIS C ANTIBODY: HCV Ab: NEGATIVE

## 2014-11-01 LAB — TSH: TSH: 3.11 u[IU]/mL (ref 0.35–4.50)

## 2014-11-01 LAB — VALPROIC ACID LEVEL: Valproic Acid Lvl: 88.2 ug/mL (ref 50.0–100.0)

## 2014-11-27 ENCOUNTER — Telehealth: Payer: Self-pay | Admitting: Nurse Practitioner

## 2014-11-27 ENCOUNTER — Other Ambulatory Visit: Payer: Self-pay | Admitting: Family Medicine

## 2014-11-27 NOTE — Telephone Encounter (Signed)
Patient is not sure if she has yeast infection or bacteria infection. She is also having pain wants to come in to see PG.

## 2014-11-27 NOTE — Telephone Encounter (Signed)
Spoke with patient. Patient states that she has a history of chronic yeast infections. Has been experiencing vaginal dryness and discomfort for 3 weeks. Was seen with her PCP and given 2 Diflucan. Took the last Diflucan 2 weeks ago. States she is still having symptoms. "Nothing has changed. I even tried Monistat and nothing is different. Sitting is painful." Advised she will need to be seen in the office for further evaluation. Patient is agreeable. Declines an appointment for today. Appointment scheduled for tomorrow at 3pm with Kem Boroughs, FNP. Agreeable to date and time.  Routing to provider for final review. Patient agreeable to disposition. Will close encounter.

## 2014-11-28 ENCOUNTER — Encounter: Payer: Self-pay | Admitting: Nurse Practitioner

## 2014-11-28 ENCOUNTER — Ambulatory Visit (INDEPENDENT_AMBULATORY_CARE_PROVIDER_SITE_OTHER): Payer: 59 | Admitting: Nurse Practitioner

## 2014-11-28 VITALS — BP 130/80 | HR 88 | Temp 98.4°F | Ht 69.0 in | Wt 174.0 lb

## 2014-11-28 DIAGNOSIS — N760 Acute vaginitis: Secondary | ICD-10-CM

## 2014-11-28 DIAGNOSIS — R300 Dysuria: Secondary | ICD-10-CM | POA: Diagnosis not present

## 2014-11-28 LAB — POCT URINALYSIS DIPSTICK
Bilirubin, UA: NEGATIVE
Blood, UA: NEGATIVE
Glucose, UA: NEGATIVE
Leukocytes, UA: NEGATIVE
Nitrite, UA: NEGATIVE
Protein, UA: NEGATIVE
Urobilinogen, UA: NEGATIVE
pH, UA: 5

## 2014-11-28 MED ORDER — FLUCONAZOLE 150 MG PO TABS: 150.0000 mg | ORAL_TABLET | Freq: Once | ORAL | Status: DC

## 2014-11-28 MED ORDER — NYSTATIN-TRIAMCINOLONE 100000-0.1 UNIT/GM-% EX OINT: 1.0000 "application " | TOPICAL_OINTMENT | Freq: Two times a day (BID) | CUTANEOUS | Status: DC

## 2014-11-28 MED ORDER — METRONIDAZOLE 0.75 % VA GEL: 1.0000 | Freq: Every day | VAGINAL | Status: DC

## 2014-11-28 NOTE — Patient Instructions (Signed)
Bacterial Vaginosis Bacterial vaginosis is a vaginal infection that occurs when the normal balance of bacteria in the vagina is disrupted. It results from an overgrowth of certain bacteria. This is the most common vaginal infection in women of childbearing age. Treatment is important to prevent complications, especially in pregnant women, as it can cause a premature delivery. CAUSES  Bacterial vaginosis is caused by an increase in harmful bacteria that are normally present in smaller amounts in the vagina. Several different kinds of bacteria can cause bacterial vaginosis. However, the reason that the condition develops is not fully understood. RISK FACTORS Certain activities or behaviors can put you at an increased risk of developing bacterial vaginosis, including:  Having a new sex partner or multiple sex partners.  Douching.  Using an intrauterine device (IUD) for contraception. Women do not get bacterial vaginosis from toilet seats, bedding, swimming pools, or contact with objects around them. SIGNS AND SYMPTOMS  Some women with bacterial vaginosis have no signs or symptoms. Common symptoms include:  Grey vaginal discharge.  A fishlike odor with discharge, especially after sexual intercourse.  Itching or burning of the vagina and vulva.  Burning or pain with urination. DIAGNOSIS  Your health care provider will take a medical history and examine the vagina for signs of bacterial vaginosis. A sample of vaginal fluid may be taken. Your health care provider will look at this sample under a microscope to check for bacteria and abnormal cells. A vaginal pH test may also be done.  TREATMENT  Bacterial vaginosis may be treated with antibiotic medicines. These may be given in the form of a pill or a vaginal cream. A second round of antibiotics may be prescribed if the condition comes back after treatment. Because bacterial vaginosis increases your risk for sexually transmitted diseases, getting  treated can help reduce your risk for chlamydia, gonorrhea, HIV, and herpes. HOME CARE INSTRUCTIONS   Only take over-the-counter or prescription medicines as directed by your health care provider.  If antibiotic medicine was prescribed, take it as directed. Make sure you finish it even if you start to feel better.  Tell all sexual partners that you have a vaginal infection. They should see their health care provider and be treated if they have problems, such as a mild rash or itching.  During treatment, it is important that you follow these instructions:  Avoid sexual activity or use condoms correctly.  Do not douche.  Avoid alcohol as directed by your health care provider.  Avoid breastfeeding as directed by your health care provider. SEEK MEDICAL CARE IF:   Your symptoms are not improving after 3 days of treatment.  You have increased discharge or pain.  You have a fever. MAKE SURE YOU:   Understand these instructions.  Will watch your condition.  Will get help right away if you are not doing well or get worse. FOR MORE INFORMATION  Centers for Disease Control and Prevention, Division of STD Prevention: AppraiserFraud.fi American Sexual Health Association (ASHA): www.ashastd.org    This information is not intended to replace advice given to you by your health care provider. Make sure you discuss any questions you have with your health care provider.   Document Released: 01/27/2005 Document Revised: 02/17/2014 Document Reviewed: 09/08/2012 Elsevier Interactive Patient Education 2016 [employer].   Use extra virgin olive oil to vulva after your bath or shower daily Use triamcinolone cream as directed  to vulva twice a day Use Metrogel vaginal gel at night for 5 nights  After all symptoms are gone:  Use hormone vaginal cream intravaginally 2-3 times a week for a month - need to 'fluff' the tissue again Then use a small vibrator to use and feel comfortable with  insertion and use before trying to be sexually active again

## 2014-11-28 NOTE — Progress Notes (Signed)
54 y.o. Married Caucasian female G0P0 here with complaint of vaginal symptoms of itching, burning, and increase discharge. Describes discharge as white and thin.  She used OTC Monistat X 2 over a month ago without help.  She then went to PCP and was given Diflucan in mid September and maybe a little help from that.  She continues on HRT but not using the vaginal cream hardly at all.  She has not been SA secondary to pain.   Onset of symptoms 2 months ago. Denies new personal products or vaginal dryness. No  STD concerns. Urinary symptoms includes dysuria that is hard for her to tell if from vulva or urethra. . Contraception is post menopausal.   O:  Healthy female WDWN Affect: normal, orientation x 3  Exam: alert, no distress Abdomen: soft and non tender Lymph node: no enlargement or tenderness Pelvic exam: External genital: normal female with slight swollen and irritated vulva with thin white discharge BUS: negative Vagina: thin white discharge noted.  Affirm taken. Cervix: normal, non tender, no CMT Uterus: normal, non tender Adnexa:normal, non tender, no masses or fullness noted   A: Vaginitis - suspect BV  R/O UTI  Atrophic vaginitis that is most likely the initial cause of PH changes   P: Discussed findings of vaginitis and etiology. Discussed Aveeno or baking soda sitz bath for comfort. Avoid moist clothes or pads for extended period of time. If working out in gym clothes or swim suits for long periods of time change underwear or bottoms of swimsuit if possible. Olive Oil/Coconut Oil use for skin protection prior to activity can be used to external skin.  Rx: went ahead and started Metrogel as she was very uncomfortable today  Also given Triamcinolone and Nystatin to use prn vulva BID  Follow with Affirm  She is given written instruction for use of treatment now and afterwards to help with vaginal dryness.  RV prn

## 2014-11-29 LAB — WET PREP BY MOLECULAR PROBE
Candida species: NEGATIVE
Gardnerella vaginalis: POSITIVE — AB
Trichomonas vaginosis: NEGATIVE

## 2014-11-29 LAB — URINE CULTURE
Colony Count: NO GROWTH
Organism ID, Bacteria: NO GROWTH

## 2014-12-01 NOTE — Progress Notes (Signed)
Encounter reviewed by Dr. Tonee Silverstein Amundson C. Silva.  

## 2014-12-11 ENCOUNTER — Telehealth: Payer: Self-pay | Admitting: Nurse Practitioner

## 2014-12-11 MED ORDER — METRONIDAZOLE 500 MG PO TABS: 500.0000 mg | ORAL_TABLET | Freq: Two times a day (BID) | ORAL | Status: DC

## 2014-12-11 NOTE — Telephone Encounter (Signed)
Spoke with patient. Seen by Kem Boroughs, FNP and bacterial vaginosis on affirm 11/28/14. Patient states she hasn't been able to complete a full 5 nights of treatment with Metrogel due to diarrhea that she has each night with her IBS symptoms. Patient continues to have vaginal discharge and irritation, same symptoms as on 11/28/14. She requests to change treatment to oral Flagyl, has used this in the past and tolerated well so that she can complete entire course of treatment. Advised will review with covering provider and return call. Patient agreeable.

## 2014-12-11 NOTE — Telephone Encounter (Signed)
Ok for Flagyl 500 mg po bid for 7 nights.  Thanks.

## 2014-12-11 NOTE — Telephone Encounter (Signed)
Patient calling requesting an Rx for Flagyl. She said, "The problem I was last seen for (DOS 11/28/14) has not gotten better. I have irritable bowel syndrome and the medicine she gave me is not sitting in there long enough." Patient declined an appointment until speaking with the nurse.

## 2014-12-11 NOTE — Telephone Encounter (Signed)
Call to patient and advise order sent for Flagyl 500 mg PO bid x 7 days. Patient agreeable. She will call back with any concerns. Aware of alcohol precautions.  Routing to provider for final review. Patient agreeable to disposition. Will close encounter.

## 2014-12-15 ENCOUNTER — Other Ambulatory Visit (INDEPENDENT_AMBULATORY_CARE_PROVIDER_SITE_OTHER): Payer: 59

## 2014-12-15 ENCOUNTER — Other Ambulatory Visit: Payer: Self-pay | Admitting: *Deleted

## 2014-12-15 ENCOUNTER — Encounter: Payer: Self-pay | Admitting: Internal Medicine

## 2014-12-15 ENCOUNTER — Ambulatory Visit (INDEPENDENT_AMBULATORY_CARE_PROVIDER_SITE_OTHER): Payer: 59 | Admitting: Internal Medicine

## 2014-12-15 VITALS — BP 104/68 | HR 85 | Temp 97.6°F | Resp 12 | Ht 69.0 in | Wt 175.2 lb

## 2014-12-15 DIAGNOSIS — E039 Hypothyroidism, unspecified: Secondary | ICD-10-CM

## 2014-12-15 DIAGNOSIS — E042 Nontoxic multinodular goiter: Secondary | ICD-10-CM

## 2014-12-15 LAB — T4, FREE: Free T4: 0.88 ng/dL (ref 0.60–1.60)

## 2014-12-15 LAB — TSH: TSH: 2.37 u[IU]/mL (ref 0.35–4.50)

## 2014-12-15 LAB — T3, FREE: T3, Free: 2.7 pg/mL (ref 2.3–4.2)

## 2014-12-15 NOTE — Patient Instructions (Signed)
Please stop at the lab.  Please return to the clinic if the labs are abnormal.

## 2014-12-15 NOTE — Progress Notes (Signed)
Patient ID: Beverly Cole, female   DOB: 1960-05-12, 54 y.o.   MRN: [id number]   HPI  Beverly Cole is a 54 y.o.-year-old female, referred by her PCP, Dr. Etter Sjogren, for evaluation for multinodular thyroid. She has seen both my colleagues: Dr. Loanne Drilling and Dr. Dwyane Dee in 2014.  Thyroid U/S (12/02/2013): Small thyroid nodules, with no concerning features.  Ba swallow (04/05/2013): Minimal deformity noted of the lower posterior cervical esophagus from prominent cervical osteophytes, otherwise normal exam.  She had a sleep study >> reportedly no OSA.  Pt c/o: - + choking at night - + food comes up  - + coughing  - no feeling nodules in neck - + hoarseness - no SOB with lying down  I reviewed pt's thyroid tests: Lab Results  Component Value Date   TSH 3.11 10/31/2014   TSH 1.58 11/02/2013   TSH 2.13 03/18/2013   TSH 4.65 11/09/2012   TSH 2.94 03/05/2012   TSH 4.90 05/19/2011   TSH 2.51 10/10/2010   TSH 4.81 04/05/2010   TSH 5.48 03/20/2009   TSH 2.45 07/06/2008   FREET4 0.71 11/09/2012   FREET4 0.77 03/05/2012   FREET4 0.7 03/20/2009   FREET4 0.6 05/21/2006    Component     Latest Ref Rng 03/18/2013  Thyroperoxidase Ab SerPl-aCnc     <35.0 IU/mL <10.0   + FH of thyroid ds - thyroid cancer in mother. No h/o radiation tx to head or neck.  No seaweed or kelp. No recent contrast studies. No steroid use. No herbal supplements. + Biotin - not today.  ROS: Constitutional: no weight gain/loss, + fatigue, + subjective hyperthermia, increased sleep Eyes: no blurry vision, no xerophthalmia ENT: no sore throat, + see HPI Cardiovascular: no CP/SOB/palpitations/leg swelling Respiratory: no cough/SOB Gastrointestinal: no N/V/D/C/+ acid reflux Musculoskeletal: no muscle/joint aches Skin: no rashes Neurological: no tremors/numbness/tingling/dizziness Psychiatric: + depression (with hypersomnia)/+ anxiety  Past Medical History  Diagnosis Date  . DEPRESSION   . GERD   . HYPERLIPIDEMIA    . MYALGIA   . BIPOLAR DISORDER UNSPECIFIED   . IRRITABLE BOWEL SYNDROME, HX OF   . Hypertension   . Atrial tachycardia ([place])   . Hypertension   . Headache(784.0)     usually with cycle  . Bursitis of hip 09/1999    Bilateral - Dr. Berenice Primas  . Shortness of breath     Pulmonary eval 05/2002  . Mitral valve prolapse 12/17/2000    cardiolyte study 12/2000 neg  . Fistula of female genitalia 03/29/2007    enterovesical fistula, not reapired  . Anxiety   . Menopausal state 01/2012    FSH = 88.5  . Thyroid nodule 2014   Past Surgical History  Procedure Laterality Date  . Lipoma (r) side  1990    Right back   Social History   Social History  . Marital Status: Married    Spouse Name: N/A  . Number of Children: 0   Occupational History  . 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 Smoking status: Former Smoker -- 1.00 packs/day for 25 years    Types: Cigarettes    Quit date: 02/11/1996  . Smokeless tobacco: Never Used     Comment: Married, lives with spouse. 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 Alcohol Use: 1.5 oz/week    3 drink(s) per week  . Drug Use: No  . Sexual Activity:    Partners: Male    Birth Control/ Protection: Post-menopausal  Social History Narrative   Married, lives with spouse in [place]. 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 buPROPion (WELLBUTRIN XL) 150 MG 24 hr tablet Take 150 mg by mouth at bedtime.     . Cariprazine HCl (VRAYLAR) 1.5 MG CAPS Take 1 capsule by mouth daily.    . Cholecalciferol (VITAMIN D3) 1000 UNITS CAPS Take 1,000 Units by mouth at bedtime.     Marland Kitchen diltiazem (CARTIA XT) 180 MG 24 hr capsule TAKE 1 CAPSULE (180 MG TOTAL) BY MOUTH DAILY. 90 capsule 3  . divalproex (DEPAKOTE) 500 MG 24 hr tablet Take 1,500 mg by mouth at bedtime.     Marland Kitchen doxycycline (ORACEA) 40 MG capsule TAKE 1 CAPSULE BY MOUTH DAILY. 90 capsule PRN   . esomeprazole (NEXIUM) 40 MG capsule TAKE 1 CAPSULE BY MOUTH AT BEDTIME. 90 capsule 3  . estradiol (ESTRACE) 0.1 MG/GM vaginal cream Use 1/2 g vaginally three times a week 42.5 g 3  . estradiol (ESTRACE) 1 MG tablet Take 1 tablet (1 mg total) by mouth daily. 90 tablet 4  . FINACEA 15 % FOAM Apply 1 application topically daily as needed (skin).   3  . fluconazole (DIFLUCAN) 150 MG tablet Take 1 tablet (150 mg total) by mouth once. Take one tablet.  Repeat in 48 hours if symptoms are not completely resolved. 2 tablet 1  . fluticasone (FLONASE) 50 MCG/ACT nasal spray Place 2 sprays into the nose daily as needed. For allergies    . lidocaine (XYLOCAINE) 2 % jelly Apply topically as needed (for dysparunia). (Patient taking differently: Apply 1 application topically daily as needed (for dysparunia). ) 30 mL 12  . medroxyPROGESTERone (PROVERA) 5 MG tablet Take 1 tablet (5 mg total) by mouth daily. 90 tablet 3  . metroNIDAZOLE (FLAGYL) 500 MG tablet Take 1 tablet (500 mg total) by mouth 2 (two) times daily. 14 tablet 0  . metroNIDAZOLE (METROGEL) 0.75 % vaginal gel Place 1 Applicatorful vaginally at bedtime. 70 g 0  . nitrofurantoin, macrocrystal-monohydrate, (MACROBID) 100 MG capsule Take 1 capsule (100 mg total) by mouth once as needed (post coital). 30 capsule 6  . NONFORMULARY OR COMPOUNDED ITEM Testosterone propionate 2% in white petrolatum, apply bid for 6 weeks and then daily as directed.  60 grams. 60 each 1  . nystatin-triamcinolone ointment (MYCOLOG) Apply 1 application topically 2 (two) times daily. 30 g 0  . Probiotic Product (PROBIOTIC DAILY PO) Take 1 tablet by mouth daily at 6 (six) AM.    . propranolol (INDERAL) 10 MG tablet Take 10 mg by mouth 2 (two) times daily.   1   No current facility-administered medications on file prior to visit.   Allergies  Allergen Reactions  . Lopressor [Metoprolol]     Hallucinations hair falls out   Family History  Problem Relation Age of Onset  .  Lung cancer Mother   . Hypertension Mother   . Hyperlipidemia Mother   . Cancer Father     Esophageal cancer  . Hyperlipidemia Father   . Colon cancer Paternal Uncle   . Sarcoidosis Other   . Testicular cancer Other     PE: BP 104/68 mmHg  Pulse 85  Temp(Src) 97.6 F (36.4 C) (Oral)  Resp 12  Ht 5\' 9"  (1.753 m)  Wt 175 lb 3.2 oz (79.47 kg)  BMI 25.86 kg/m2  SpO2 96%  LMP 05/11/2012 Wt  Readings from Last 3 Encounters:  12/15/14 175 lb 3.2 oz (79.47 kg)  11/28/14 174 lb (78.926 kg)  10/31/14 175 lb 12.8 oz (79.742 kg)    Constitutional: overweight, in NAD Eyes: PERRLA, EOMI, no exophthalmos ENT: moist mucous membranes, no thyromegaly, no cervical lymphadenopathy Cardiovascular: RRR, No MRG Respiratory: CTA B Gastrointestinal: abdomen soft, NT, ND, BS+ Musculoskeletal: no deformities, strength intact in all 4;  Skin: moist, warm, no rashes Neurological: no tremor with outstretched hands, DTR normal in all 4  ASSESSMENT: 1. MNG - Thyroid U/S (12/02/2013):  Right thyroid lobe  Measurements: 5.0 x 1.1 x 1.4 cm. Small 3 mm hypoechoic nodule in the lateral aspect of the mid gland is unchanged compared to prior. Echogenic foci with ring down artifact is consistent with the presence of colloid and highly suggestive of a benign colloid nodule.  Left thyroid lobe  Measurements: 6.0 x 1.2 x 1.3 cm. Hypoechoic nodule with colloid ring down artifact in the midpole measures up to 3 mm. Slightly more inferiorly who there is a second circumscribed hypoechoic nodule with internal ring down artifact which measures up to 3 mm. Overall, both nodules of actually decreased in size compared to the prior ultrasound.  Isthmus  Thickness: 0.4 cm. No nodules visualized.  Lymphadenopathy  None visualized.  IMPRESSION: Stable to decreased size of small bilateral thyroid nodules and cysts compared to 10/13/2012. Additionally, all 3 nodules demonstrate internal ring down  artifact consistent with the presence of colloid and are therefore almost certainly benign degenerating colloid nodules.   PLAN: 1. MNG  - I reviewed the images of her thyroid ultrasound along with the patient. I pointed out that all the nodules are very small, actually small cysts, filled with colloid. Some of them contain colloid granules (they show reverberation artifact, a benign feature). None of the cysts appear worrisome.  - Pt does have a thyroid cancer family history,  however, her thyroid ultrasound does not show any indication she may have the disease. Also, reviewing the images of the thyroid ultrasound and the barium swallow images, I do not see any possible esophageal compression from the thyroid. The barium swallow from 03/2013 showed some indication that she may have mild posterior compression on esophagus from her spine, but not from the thyroid - At this point, I suggested that we recheck her thyroid tests and her thyroid antibodies to see if she has Hashimoto thyroiditis. If she does, there is possible fluctuating enlargement of the thyroid during the periods of decreased immune status, which may correspond with her choking episodes.  - Otherwise, I also suggested that she checks with ENT, for possible vocal cord dysfunction  - I am not sure whether component of anxiety is not playing a role (globus sensation)  - We will schedule another appointment only if the above results are abnormal   - time spent with the patient: 40 minutes, of which >50% was spent in obtaining information about her symptoms, reviewing her previous labs, evaluations, and treatments, counseling her about her condition (please see the discussed topics above), and developing a plan to further investigate it; she had a number of questions which I addressed.  Component     Latest Ref Rng 12/15/2014  TSH     0.35 - 4.50 uIU/mL 2.37  T3, Free     2.3 - 4.2 pg/mL 2.7  Free T4     0.60 - 1.60 ng/dL 0.88   Thyroperoxidase Ab SerPl-aCnc     0 - 34 IU/mL 9  Thyroglobulin Antibody     0.0 - 0.9 IU/mL <1.0   No sign of thyroid dysfunction. No evidence of Hashimoto thyroiditis.

## 2014-12-18 DIAGNOSIS — E042 Nontoxic multinodular goiter: Secondary | ICD-10-CM | POA: Insufficient documentation

## 2014-12-18 LAB — THYROID PEROXIDASE ANTIBODY: Thyroperoxidase Ab SerPl-aCnc: 9 IU/mL (ref 0–34)

## 2014-12-18 LAB — THYROGLOBULIN ANTIBODY: Thyroglobulin Antibody: <1 IU/mL (ref 0.0–0.9)

## 2014-12-29 ENCOUNTER — Encounter: Payer: Self-pay | Admitting: Psychiatry

## 2014-12-29 ENCOUNTER — Ambulatory Visit (INDEPENDENT_AMBULATORY_CARE_PROVIDER_SITE_OTHER): Payer: 59 | Admitting: Psychiatry

## 2014-12-29 VITALS — BP 124/86 | HR 104 | Temp 98.2°F | Ht 68.0 in | Wt 171.4 lb

## 2014-12-29 DIAGNOSIS — F332 Major depressive disorder, recurrent severe without psychotic features: Secondary | ICD-10-CM | POA: Diagnosis not present

## 2014-12-29 NOTE — Progress Notes (Signed)
[facility] MD Progress Note  12/29/2014 7:07 PM Beverly Cole  MRN:  [id number] Subjective:  This is a ECT consult note for this 54 year old woman with a history of depression. Referred by her outpatient psychiatrist Dr. Clovis Pu. Patient states current symptoms are depressed mood which is present almost all the time. There he minimal enjoyment of anything in her life. Very little energy. She feels like life has nothing in it that is worth living for. She has had suicidal thoughts without any actual intent carrying through on it. She does not sleep very well at night appetite is not very good. She is not having any psychotic symptoms does not have auditory or visual hallucinations. She is currently compliant with prescribed medications which is Depakote 2000 mg at night and Wellbutrin 150 mg per day. Her outpatient psychiatrist suggested that she consider ECT. Patient is quite impaired. Does almost nothing around the house. Doesn't enjoy her relationships with her family very much.  Patient states that depression has been present her entire life. At age 39 she claims that she tried to kill her self.  Social history is that the patient lives with her husband. Has no children. Patient is not working outside the home. Husband is employed by the [employer] system.  Medical history patient has high blood pressure and gastric reflux. No history of MI or stroke. She has never had surgery but she has had anesthesia in the past for colonoscopies. No complications.  Substance abuse history: Does not drink or use drugs denies that she's had any substance abuse problems in the past. Principal Problem: @PPROB @ Diagnosis:   Patient Active Problem List   Diagnosis Date Noted  . Goiter, nontoxic, multinodular [E04.2] 12/18/2014  . Multinodular goiter [E04.2] 10/27/2012  . Tremor [R25.1] 05/09/2012  . Fistula of female genitalia [N82.9]   . SVT (supraventricular tachycardia) ([place]) [I47.1] 12/03/2011  . Hypertension [I10]  08/26/2010  . Hyperlipidemia [E78.5] 03/13/2009  . Bipolar 2 disorder ([place]) [F31.81] 03/13/2009  . Hypothyroidism [E03.9] 07/06/2008  . GERD [K21.9] 11/05/2006  . ACNE NEC [L70.8] 11/05/2006  . DEPRESSION [F32.9] 07/23/2006   Total Time spent with patient: 1 hour  Past Psychiatric History: Patient states that she tried to kill herself when she was 54 years old. She has had long-standing depression and unhappiness. She had one other suicide attempt in 1980. She has been seeing her current psychiatrist only about 6 months. She has had multiple medications in the past including several anticonvulsant medicines as well as antidepressants. She has been given a diagnosis of bipolar disorder although the patient denies that she has ever had a manic episode. Fairly recently she was prescribed Vraylar which she took for only about a week and she says that it made her feel suicidal.  Past Medical History:  Past Medical History  Diagnosis Date  . DEPRESSION   . GERD   . HYPERLIPIDEMIA   . MYALGIA   . BIPOLAR DISORDER UNSPECIFIED   . IRRITABLE BOWEL SYNDROME, HX OF   . Hypertension   . Atrial tachycardia ([place])   . Hypertension   . Headache(784.0)     usually with cycle  . Bursitis of hip 09/1999    Bilateral - Dr. Berenice Primas  . Shortness of breath     Pulmonary eval 05/2002  . Mitral valve prolapse 12/17/2000    cardiolyte study 12/2000 neg  . Fistula of female genitalia 03/29/2007    enterovesical fistula, not reapired  . Anxiety   . Menopausal state 01/2012  FSH = 88.5  . Thyroid nodule 2014    Past Surgical History  Procedure Laterality Date  . Lipoma (r) side  1990    Right back   Family History:  Family History  Problem Relation Age of Onset  . Lung cancer Mother   . Hypertension Mother   . Hyperlipidemia Mother   . Cancer Father     Esophageal cancer  . Hyperlipidemia Father   . Depression Father   . Colon cancer Paternal Uncle   . Sarcoidosis Other   . Testicular cancer  Other   . Cancer Brother    Family Psychiatric  History: Patient states that her father had ECT. She doesn't know anything about his symptoms or his response. She had another aunt who had a psychiatric hospitalization. No other known mental health or substance abuse history in the family Social History:  History  Alcohol Use No     History  Drug Use No    Social History   Social History  . Marital Status: Married    Spouse Name: N/A  . Number of Children: 0  . Years of Education: N/A   Occupational History  . 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 Smoking status: Former Smoker -- 1.00 packs/day for 25 years    Types: Cigarettes    Quit date: 02/11/1996  . Smokeless tobacco: Never Used     Comment: Married, lives with spouse. 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 Alcohol Use: No  . Drug Use: No  . Sexual Activity:    Partners: Male    Birth Control/ Protection: Post-menopausal   Other Topics Concern  . None   Social History Narrative   Married, lives with spouse in [place]. 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 buPROPion (WELLBUTRIN XL) 150 MG 24 hr tablet Take 150 mg by mouth at bedtime.     . Cholecalciferol (VITAMIN D3) 1000 UNITS CAPS Take 1,000 Units by mouth at bedtime.     Marland Kitchen diltiazem (CARTIA XT) 180 MG 24 hr capsule TAKE 1 CAPSULE (180 MG TOTAL) BY MOUTH DAILY. 90 capsule 3  . divalproex (DEPAKOTE) 500 MG 24 hr tablet Take 1,500 mg by mouth at bedtime.     Marland Kitchen doxycycline (ORACEA) 40 MG capsule TAKE 1 CAPSULE BY MOUTH DAILY. 90 capsule PRN  . esomeprazole (NEXIUM) 40 MG capsule TAKE 1 CAPSULE BY MOUTH AT BEDTIME. 90 capsule 3  . estradiol (ESTRACE) 0.1 MG/GM vaginal cream Use 1/2 g vaginally  three times a week 42.5 g 3  . estradiol (ESTRACE) 1 MG tablet Take 1 tablet (1 mg total) by mouth daily. 90 tablet 4  . FINACEA 15 % FOAM Apply 1 application topically daily as needed (skin).   3  . fluconazole (DIFLUCAN) 150 MG tablet Take 1 tablet (150 mg total) by mouth once. Take one tablet.  Repeat in 48 hours if symptoms are not completely resolved. 2 tablet 1  . fluticasone (FLONASE) 50 MCG/ACT nasal spray Place 2 sprays into the nose daily as needed. For allergies    . lidocaine (XYLOCAINE)  2 % jelly Apply topically as needed (for dysparunia). (Patient taking differently: Apply 1 application topically daily as needed (for dysparunia). ) 30 mL 12  . medroxyPROGESTERone (PROVERA) 5 MG tablet Take 1 tablet (5 mg total) by mouth daily. 90 tablet 3  . metroNIDAZOLE (FLAGYL) 500 MG tablet Take 1 tablet (500 mg total) by mouth 2 (two) times daily. 14 tablet 0  . metroNIDAZOLE (METROGEL) 0.75 % vaginal gel Place 1 Applicatorful vaginally at bedtime. 70 g 0  . nitrofurantoin, macrocrystal-monohydrate, (MACROBID) 100 MG capsule Take 1 capsule (100 mg total) by mouth once as needed (post coital). 30 capsule 6  . NONFORMULARY OR COMPOUNDED ITEM Testosterone propionate 2% in white petrolatum, apply bid for 6 weeks and then daily as directed.  60 grams. 60 each 1  . nystatin-triamcinolone ointment (MYCOLOG) Apply 1 application topically 2 (two) times daily. 30 g 0  . Probiotic Product (PROBIOTIC DAILY PO) Take 1 tablet by mouth daily at 6 (six) AM.    . propranolol (INDERAL) 10 MG tablet Take 10 mg by mouth 2 (two) times daily.   1  . Cariprazine HCl (VRAYLAR) 1.5 MG CAPS Take 1 capsule by mouth daily.     No current facility-administered medications for this visit.    Lab Results: No results found for this or any previous visit (from the past 48 hour(s)).  Physical Findings: AIMS:  , ,  ,  ,    CIWA:    COWS:     Musculoskeletal: Strength & Muscle Tone: within normal limits Gait & Station:  normal Patient leans: N/A  Psychiatric Specialty Exam: ROS  Blood pressure 124/86, pulse 104, temperature 98.2 F (36.8 C), temperature source Tympanic, height 5\' 8"  (1.727 m), weight 171 lb 6.4 oz (77.747 kg), last menstrual period 05/11/2012, SpO2 97 %.Body mass index is 26.07 kg/(m^2).  General Appearance: Casual  Eye Contact::  Fair  Speech:  Normal Rate  Volume:  Normal  Mood:  Depressed  Affect:  Full Range  Thought Process:  Linear  Orientation:  Full (Time, Place, and Person)  Thought Content:  Negative  Suicidal Thoughts:  Yes.  without intent/plan  Homicidal Thoughts:  No  Memory:  Immediate;   Good Recent;   Good Remote;   Good  Judgement:  Fair  Insight:  Fair  Psychomotor Activity:  Normal  Concentration:  Fair  Recall:  [place] of Knowledge:Good  Language: Good  Akathisia:  No  Handed:  Right  AIMS (if indicated):     Assets:  Communication Skills Desire for Improvement Financial Resources/Insurance Housing Intimacy Leisure Time Physical Health Resilience Social Support  ADL's:  Intact  Cognition: WNL  Sleep:      Treatment Plan Summary: Plan Patient was counseled about the nature of ECT including appropriate indications and a description of the procedure. This 54 year old woman describes symptoms of major depression that have been going on lifelong. She has had minimal response to antidepressant medicine. She continues to be highly impaired despite appropriate medication management. By these criteria the patient would be a good candidate for ECT. Risks and benefits were described including side effects and potential benefit and long-term treatment plan. Patient was given opportunity to ask any questions she wanted. Ultimately she opted to agreed to a trial of right unilateral ECT treatment with a tentative start date of November 28. The usual labs were ordered and the patient was given the order sheet and instructions. I will send her information to the  utilization nurse  for insurance approval. Patient will be advised there is any change in plans. She has our number and can call sooner if need be. Because she is currently taking Depakote I advised her that she will need to be off of that in order for [place] to try ECT. Patient understands risks of worsening of her mood symptoms. She will decrease the dose 2000 mg a day for 5 days and then to 500 mg for 5 days. Call or get in touch with her primary psychiatrist of any problem.  Natan Hartog 12/29/2014, 7:07 PM

## 2015-01-01 ENCOUNTER — Ambulatory Visit
Admission: RE | Admit: 2015-01-01 | Discharge: 2015-01-01 | Disposition: A | Discharge status: Home / Self Care | Payer: 59 | Source: Ambulatory Visit | Attending: Psychiatry | Admitting: Psychiatry

## 2015-01-01 ENCOUNTER — Ambulatory Visit: Payer: 59 | Admitting: Licensed Clinical Social Worker

## 2015-01-01 ENCOUNTER — Encounter
Admission: RE | Admit: 2015-01-01 | Discharge: 2015-01-01 | Disposition: A | Discharge status: Home / Self Care | Payer: 59 | Source: Ambulatory Visit | Attending: Psychiatry | Admitting: Psychiatry

## 2015-01-01 DIAGNOSIS — Z01818 Encounter for other preprocedural examination: Secondary | ICD-10-CM | POA: Diagnosis not present

## 2015-01-01 DIAGNOSIS — F329 Major depressive disorder, single episode, unspecified: Secondary | ICD-10-CM

## 2015-01-01 DIAGNOSIS — F32A Depression, unspecified: Secondary | ICD-10-CM

## 2015-01-01 DIAGNOSIS — Z01810 Encounter for preprocedural cardiovascular examination: Secondary | ICD-10-CM | POA: Diagnosis present

## 2015-01-01 HISTORY — DX: Tremor, unspecified: R25.1

## 2015-01-01 LAB — URINALYSIS COMPLETE WITH MICROSCOPIC (ARMC ONLY)
Bilirubin Urine: NEGATIVE
Glucose, UA: NEGATIVE mg/dL
Hgb urine dipstick: NEGATIVE
Leukocytes, UA: NEGATIVE
Nitrite: NEGATIVE
Protein, ur: NEGATIVE mg/dL
Specific Gravity, Urine: 1.025 (ref 1.005–1.030)
pH: 6 (ref 5.0–8.0)

## 2015-01-01 LAB — BASIC METABOLIC PANEL
Anion gap: 11 (ref 5–15)
BUN: 14 mg/dL (ref 6–20)
CO2: 19 mmol/L — ABNORMAL LOW (ref 22–32)
Calcium: 8.9 mg/dL (ref 8.9–10.3)
Chloride: 103 mmol/L (ref 101–111)
Creatinine, Ser: 0.86 mg/dL (ref 0.44–1.00)
GFR calc Af Amer: >60 mL/min (ref >60)
GFR calc non Af Amer: >60 mL/min (ref >60)
Glucose, Bld: 96 mg/dL (ref 65–99)
Potassium: 3.2 mmol/L — ABNORMAL LOW (ref 3.5–5.1)
Sodium: 133 mmol/L — ABNORMAL LOW (ref 135–145)

## 2015-01-01 LAB — CBC
HCT: 43.2 % (ref 35.0–47.0)
Hemoglobin: 14.6 g/dL (ref 12.0–16.0)
MCH: 30.4 pg (ref 26.0–34.0)
MCHC: 33.8 g/dL (ref 32.0–36.0)
MCV: 89.8 fL (ref 80.0–100.0)
Platelets: 214 10*3/uL (ref 150–440)
RBC: 4.81 MIL/uL (ref 3.80–5.20)
RDW: 14.6 % — ABNORMAL HIGH (ref 11.5–14.5)
WBC: 8.7 10*3/uL (ref 3.6–11.0)

## 2015-01-01 NOTE — Patient Instructions (Signed)
  Your procedure is scheduled on: @ADMITDT2 @ Report to Day Surgery. To find out your arrival time please call [phone] between 1PM - 3PM on   Remember: Instructions that are not followed completely may result in serious medical risk, up to and including death, or upon the discretion of your surgeon and anesthesiologist your surgery may need to be rescheduled.    ____ 1. Do not eat food or drink liquids after midnight. No gum chewing or hard candies.     ____ 2. No Alcohol for 24 hours before or after surgery.   ____ 3. Bring all medications with you on the day of surgery if instructed.    ____ 4. Notify your doctor if there is any change in your medical condition     (cold, fever, infections).     Do not wear jewelry, make-up, hairpins, clips or nail polish.  Do not wear lotions, powders, or perfumes. You may wear deodorant.  Do not shave 48 hours prior to surgery. Men may shave face and neck.  Do not bring valuables to the hospital.    [facility] is not responsible for any belongings or valuables.               Contacts, dentures or bridgework may not be worn into surgery.  Leave your suitcase in the car. After surgery it may be brought to your room.  For patients admitted to the hospital, discharge time is determined by your                treatment team.   Patients discharged the day of surgery will not be allowed to drive home.   Please read over the following fact sheets that you were given:      _x___ Take these medicines the morning of surgery with A SIP OF WATER:    1. diltiazem (CARTIA XT) 180 MG 24 hr capsule  2. esomeprazole (NEXIUM) 40 MG capsule  3.   4.  5.  6.  ____ Fleet Enema (as directed)   ____ Use CHG Soap as directed  ____ Use inhalers on the day of surgery  ____ Stop metformin 2 days prior to surgery    ____ Take 1/2 of usual insulin dose the night before surgery and none on the morning of surgery.   ____ Stop Coumadin/Plavix/aspirin on    ____ Stop Anti-inflammatories on    ____ Stop supplements until after surgery.    ____ Bring C-Pap to the hospital.

## 2015-01-08 ENCOUNTER — Encounter: Payer: Self-pay | Admitting: Anesthesiology

## 2015-01-08 ENCOUNTER — Encounter
Admission: RE | Admit: 2015-01-08 | Discharge: 2015-01-08 | Disposition: A | Discharge status: Home / Self Care | Payer: 59 | Source: Ambulatory Visit | Attending: Psychiatry | Admitting: Psychiatry

## 2015-01-08 DIAGNOSIS — I341 Nonrheumatic mitral (valve) prolapse: Secondary | ICD-10-CM | POA: Insufficient documentation

## 2015-01-08 DIAGNOSIS — E041 Nontoxic single thyroid nodule: Secondary | ICD-10-CM | POA: Diagnosis not present

## 2015-01-08 DIAGNOSIS — K219 Gastro-esophageal reflux disease without esophagitis: Secondary | ICD-10-CM | POA: Insufficient documentation

## 2015-01-08 DIAGNOSIS — Z780 Asymptomatic menopausal state: Secondary | ICD-10-CM | POA: Insufficient documentation

## 2015-01-08 DIAGNOSIS — N321 Vesicointestinal fistula: Secondary | ICD-10-CM | POA: Insufficient documentation

## 2015-01-08 DIAGNOSIS — I10 Essential (primary) hypertension: Secondary | ICD-10-CM | POA: Diagnosis not present

## 2015-01-08 DIAGNOSIS — Z888 Allergy status to other drugs, medicaments and biological substances status: Secondary | ICD-10-CM | POA: Diagnosis not present

## 2015-01-08 DIAGNOSIS — K589 Irritable bowel syndrome without diarrhea: Secondary | ICD-10-CM | POA: Insufficient documentation

## 2015-01-08 DIAGNOSIS — E785 Hyperlipidemia, unspecified: Secondary | ICD-10-CM | POA: Insufficient documentation

## 2015-01-08 DIAGNOSIS — F332 Major depressive disorder, recurrent severe without psychotic features: Secondary | ICD-10-CM | POA: Insufficient documentation

## 2015-01-08 DIAGNOSIS — Z87891 Personal history of nicotine dependence: Secondary | ICD-10-CM | POA: Insufficient documentation

## 2015-01-08 DIAGNOSIS — F419 Anxiety disorder, unspecified: Secondary | ICD-10-CM | POA: Diagnosis not present

## 2015-01-08 DIAGNOSIS — F319 Bipolar disorder, unspecified: Secondary | ICD-10-CM | POA: Diagnosis not present

## 2015-01-08 DIAGNOSIS — I471 Supraventricular tachycardia: Secondary | ICD-10-CM | POA: Insufficient documentation

## 2015-01-08 LAB — POCT I-STAT 4, (NA,K, GLUC, HGB,HCT)
Glucose, Bld: 112 mg/dL — ABNORMAL HIGH (ref 65–99)
HCT: 50 % — ABNORMAL HIGH (ref 36.0–46.0)
Hemoglobin: 17 g/dL — ABNORMAL HIGH (ref 12.0–15.0)
Potassium: 4.5 mmol/L (ref 3.5–5.1)
Sodium: 139 mmol/L (ref 135–145)

## 2015-01-08 MED ORDER — IBUPROFEN 600 MG PO TABS
600.0000 mg | ORAL_TABLET | Freq: Once | ORAL | Status: AC
  Administered 2015-01-08: 600 mg via ORAL

## 2015-01-08 MED ORDER — METHOHEXITAL SODIUM 100 MG/10ML IV SOSY
80.0000 mg | PREFILLED_SYRINGE | Freq: Once | INTRAVENOUS | Status: AC
  Administered 2015-01-08: 80 mg via INTRAVENOUS

## 2015-01-08 MED ORDER — LIDOCAINE HCL (CARDIAC) 20 MG/ML IV SOLN
4.0000 mg | Freq: Once | INTRAVENOUS | Status: AC
  Administered 2015-01-08: 4 mg via INTRAVENOUS

## 2015-01-08 MED ORDER — DEXTROSE 5 % IV SOLN
250.0000 mL | Freq: Once | INTRAVENOUS | Status: AC
  Administered 2015-01-08: 250 mL via INTRAVENOUS

## 2015-01-08 MED ORDER — ONDANSETRON HCL 4 MG/2ML IJ SOLN
4.0000 mg | Freq: Once | INTRAMUSCULAR | Status: AC
  Administered 2015-01-08: 4 mg via INTRAVENOUS

## 2015-01-08 MED ORDER — SUCCINYLCHOLINE CHLORIDE 20 MG/ML IJ SOLN
100.0000 mg | Freq: Once | INTRAMUSCULAR | Status: AC
  Administered 2015-01-08: 100 mg via INTRAVENOUS

## 2015-01-08 NOTE — Progress Notes (Signed)
Motrin given for neck pain and temp 101

## 2015-01-08 NOTE — Anesthesia Postprocedure Evaluation (Signed)
Anesthesia Post Note  Patient: Beverly Cole  Procedure(s) Performed: * No procedures listed *  Patient location during evaluation: PACU Anesthesia Type: General Level of consciousness: awake and alert Pain management: pain level controlled Vital Signs Assessment: post-procedure vital signs reviewed and stable Respiratory status: spontaneous breathing Cardiovascular status: blood pressure returned to baseline Postop Assessment: No headache Anesthetic complications: no    Last Vitals:  Filed Vitals:   01/08/15 1145 01/08/15 1149  BP: 138/78   Pulse: 93 90  Temp:  38.3 C  Resp: 20 16    Last Pain: There were no vitals filed for this visit.               Jhoselyn Ruffini M

## 2015-01-08 NOTE — H&P (Signed)
Beverly Cole is an 54 y.o. female.   Chief Complaint: Patient with severe recurrent major depression here for initiation of ECT treatment. No new physical complaints HPI: No interval change since last evaluation anxious and tearful today no suicidal ideation no psychosis  Past Medical History  Diagnosis Date  . DEPRESSION   . GERD   . HYPERLIPIDEMIA   . MYALGIA   . BIPOLAR DISORDER UNSPECIFIED   . IRRITABLE BOWEL SYNDROME, HX OF   . Hypertension   . Atrial tachycardia ([place])   . Hypertension   . Bursitis of hip 09/1999    Bilateral - Dr. Berenice Primas  . Shortness of breath     Pulmonary eval 05/2002  . Mitral valve prolapse 12/17/2000    cardiolyte study 12/2000 neg  . Fistula of female genitalia 03/29/2007    enterovesical fistula, not reapired  . Anxiety   . Menopausal state 01/2012    FSH = 88.5  . Thyroid nodule 2014  . Tremors of nervous system   . Headache(784.0)     was with menstrual cycle. no longer a problem    Past Surgical History  Procedure Laterality Date  . Lipoma (r) side  1990    Right back    Family History  Problem Relation Age of Onset  . Lung cancer Mother   . Hypertension Mother   . Hyperlipidemia Mother   . Cancer Father     Esophageal cancer  . Hyperlipidemia Father   . Depression Father   . Colon cancer Paternal Uncle   . Sarcoidosis Other   . Testicular cancer Other   . Cancer Brother    Social History:  reports that she quit smoking about 18 years ago. Her smoking use included Cigarettes. She has a 25 pack-year smoking history. She has never used smokeless tobacco. She reports that she does not drink alcohol or use illicit drugs.  Allergies:  Allergies  Allergen Reactions  . Depakote [Divalproex Sodium] Other (See Comments)    Patient becomes suicidal when taking this medication.  Also causes tremors. Patient feels that the reactions were due to the dosing being too high.  She may restart taking in the future  . Lopressor [Metoprolol]    Hallucinations hair falls out  . Nickel Rash     (Not in a hospital admission)  Results for orders placed or performed during the hospital encounter of 01/08/15 (from the past 48 hour(s))  I-STAT 4, (NA,K, GLUC, HGB,HCT)     Status: Abnormal   Collection Time: 01/08/15  9:42 AM  Result Value Ref Range   Sodium 139 135 - 145 mmol/L   Potassium 4.5 3.5 - 5.1 mmol/L   Glucose, Bld 112 (H) 65 - 99 mg/dL   HCT 50.0 (H) 36.0 - 46.0 %   Hemoglobin 17.0 (H) 12.0 - 15.0 g/dL   No results found.  Review of Systems  Constitutional: Negative.   HENT: Negative.   Eyes: Negative.   Respiratory: Negative.   Cardiovascular: Negative.   Gastrointestinal: Negative.   Musculoskeletal: Negative.   Skin: Negative.   Neurological: Negative.   Psychiatric/Behavioral: Positive for depression. Negative for suicidal ideas, memory loss and substance abuse. The patient is nervous/anxious and has insomnia.     Blood pressure 147/92, pulse 107, temperature 97.5 F (36.4 C), temperature source Oral, resp. rate 16, height 5' 8.5" (1.74 m), weight 76.204 kg (168 lb), last menstrual period 05/11/2012, SpO2 98 %. Physical Exam  Nursing note and vitals reviewed. Constitutional: She appears  well-developed and well-nourished.  HENT:  Head: Normocephalic and atraumatic.  Eyes: Conjunctivae are normal. Pupils are equal, round, and reactive to light.  Neck: Normal range of motion.  Cardiovascular: Normal rate, regular rhythm and normal heart sounds.   Respiratory: Effort normal and breath sounds normal. No respiratory distress.  GI: Soft.  Musculoskeletal: Normal range of motion.  Neurological: She is alert.  Skin: Skin is warm and dry.  Psychiatric: Her speech is normal and behavior is normal. Judgment and thought content normal. Her mood appears anxious. Cognition and memory are normal. She exhibits a depressed mood.     Assessment/Plan Patient will receive right unilateral ECT today and continue on 3  times a week schedule for the immediate future  John Clapacs 01/08/2015, 10:54 AM

## 2015-01-08 NOTE — Procedures (Signed)
ECT SERVICES Physician's Interval Evaluation & Treatment Note  Patient Identification: Beverly Cole MRN:  [id number] Date of Evaluation:  01/08/2015 TX #: 1  MADRS: 31  MMSE: 30  P.E. Findings:  Patient is anxious blood pressure is a little elevated this morning. No other new physical findings  Psychiatric Interval Note:  Depressed and anxious  Subjective:  Patient is a 54 y.o. female seen for evaluation for Electroconvulsive Therapy. Chiefly anxious  Treatment Summary:   [x]   Right Unilateral             []  Bilateral   % Energy : 0.3 ms 60%   Impedance: 1050 ohms  Seizure Energy Index: 2194 V squared  Postictal Suppression Index: 65%  Seizure Concordance Index: 79%  Medications  Pre Shock: Xylocaine 4 mg Brevital 80 mg succinylcholine 100 mg  Post Shock: None  Seizure Duration: 8 seconds by EMG, 22 seconds by EEG   Comments: Borderline seizure. Energy level will be increased to 100% for next treatment   Lungs:  [x]   Clear to auscultation               []  Other:   Heart:    [x]   Regular rhythm             []  irregular rhythm    [x]   Previous H&P reviewed, patient examined and there are NO CHANGES                 []   Previous H&P reviewed, patient examined and there are changes noted.   Alethia Berthold, MD 11/28/201610:57 AM

## 2015-01-08 NOTE — Anesthesia Preprocedure Evaluation (Signed)
Anesthesia Evaluation  Patient identified by MRN, date of birth, ID band Patient awake    Airway Mallampati: I  TM Distance: >3 FB Neck ROM: Full    Dental  (+) Teeth Intact   Pulmonary former smoker,    Pulmonary exam normal        Cardiovascular Exercise Tolerance: Good hypertension, Pt. on medications and Pt. on home beta blockers Normal cardiovascular exam     Neuro/Psych PSYCHIATRIC DISORDERS Anxiety Depression Bipolar Disorder    GI/Hepatic GERD  Medicated and Controlled,  Endo/Other  Hypothyroidism Hx of Grave's ds.  Renal/GU      Musculoskeletal   Abdominal Normal abdominal exam  (+)   Peds  Hematology   Anesthesia Other Findings   Reproductive/Obstetrics                             Anesthesia Physical Anesthesia Plan  ASA: III  Anesthesia Plan: General   Post-op Pain Management:    Induction: Intravenous  Airway Management Planned: Mask  Additional Equipment:   Intra-op Plan:   Post-operative Plan:   Informed Consent: I have reviewed the patients History and Physical, chart, labs and discussed the procedure including the risks, benefits and alternatives for the proposed anesthesia with the patient or authorized representative who has indicated his/her understanding and acceptance.     Plan Discussed with: CRNA  Anesthesia Plan Comments:         Anesthesia Quick Evaluation

## 2015-01-08 NOTE — Transfer of Care (Signed)
Immediate Anesthesia Transfer of Care Note  Patient: Beverly Cole  Procedure(s) Performed ECT  Patient Location: PACU  Anesthesia Type:General  Level of Consciousness: awake and patient cooperative  Airway & Oxygen Therapy: Patient Spontanous Breathing and Patient connected to face mask oxygen  Post-op Assessment: Report given to RN  Post vital signs: Reviewed and stable  Last Vitals:  Filed Vitals:   01/08/15 0855  BP: 147/92  Pulse: 107  Temp: 36.4 C  Resp: 16    Complications: No apparent anesthesia complications

## 2015-01-08 NOTE — Anesthesia Procedure Notes (Signed)
Date/Time: 01/08/2015 11:03 AM Performed by: Dionne Bucy Pre-anesthesia Checklist: Patient identified, Emergency Drugs available, Suction available and Patient being monitored Patient Re-evaluated:Patient Re-evaluated prior to inductionOxygen Delivery Method: Circle system utilized Preoxygenation: Pre-oxygenation with 100% oxygen Intubation Type: IV induction Ventilation: Mask ventilation without difficulty and Mask ventilation throughout procedure Airway Equipment and Method: Bite block Placement Confirmation: positive ETCO2 Dental Injury: Teeth and Oropharynx as per pre-operative assessment

## 2015-01-10 ENCOUNTER — Encounter: Payer: Self-pay | Admitting: Anesthesiology

## 2015-01-10 ENCOUNTER — Telehealth (HOSPITAL_COMMUNITY): Payer: Self-pay | Admitting: *Deleted

## 2015-01-10 ENCOUNTER — Ambulatory Visit
Admission: RE | Admit: 2015-01-10 | Discharge: 2015-01-10 | Disposition: A | Discharge status: Home / Self Care | Payer: 59 | Source: Ambulatory Visit | Attending: Psychiatry | Admitting: Psychiatry

## 2015-01-10 DIAGNOSIS — F419 Anxiety disorder, unspecified: Secondary | ICD-10-CM | POA: Insufficient documentation

## 2015-01-10 DIAGNOSIS — I10 Essential (primary) hypertension: Secondary | ICD-10-CM | POA: Insufficient documentation

## 2015-01-10 DIAGNOSIS — F332 Major depressive disorder, recurrent severe without psychotic features: Secondary | ICD-10-CM | POA: Diagnosis not present

## 2015-01-10 DIAGNOSIS — E785 Hyperlipidemia, unspecified: Secondary | ICD-10-CM | POA: Diagnosis not present

## 2015-01-10 DIAGNOSIS — F319 Bipolar disorder, unspecified: Secondary | ICD-10-CM | POA: Diagnosis not present

## 2015-01-10 DIAGNOSIS — K219 Gastro-esophageal reflux disease without esophagitis: Secondary | ICD-10-CM | POA: Insufficient documentation

## 2015-01-10 DIAGNOSIS — I341 Nonrheumatic mitral (valve) prolapse: Secondary | ICD-10-CM | POA: Insufficient documentation

## 2015-01-10 DIAGNOSIS — K589 Irritable bowel syndrome without diarrhea: Secondary | ICD-10-CM | POA: Insufficient documentation

## 2015-01-10 MED ORDER — ONDANSETRON HCL 4 MG/2ML IJ SOLN
4.0000 mg | Freq: Once | INTRAMUSCULAR | Status: AC
  Administered 2015-01-10: 4 mg via INTRAVENOUS

## 2015-01-10 MED ORDER — FENTANYL CITRATE (PF) 100 MCG/2ML IJ SOLN: 25.0000 ug | INTRAMUSCULAR | Status: DC | PRN

## 2015-01-10 MED ORDER — KETOROLAC TROMETHAMINE 30 MG/ML IJ SOLN
30.0000 mg | Freq: Once | INTRAMUSCULAR | Status: AC
  Administered 2015-01-10: 30 mg via INTRAVENOUS

## 2015-01-10 MED ORDER — DEXTROSE 5 % IV SOLN
250.0000 mL | Freq: Once | INTRAVENOUS | Status: AC
  Administered 2015-01-10: 250 mL via INTRAVENOUS

## 2015-01-10 MED ORDER — SUCCINYLCHOLINE CHLORIDE 20 MG/ML IJ SOLN
100.0000 mg | Freq: Once | INTRAMUSCULAR | Status: AC
  Administered 2015-01-10: 100 mg via INTRAVENOUS

## 2015-01-10 MED ORDER — METHOHEXITAL SODIUM 100 MG/10ML IV SOSY
80.0000 mg | PREFILLED_SYRINGE | Freq: Once | INTRAVENOUS | Status: AC
  Administered 2015-01-10: 80 mg via INTRAVENOUS

## 2015-01-10 MED ORDER — ONDANSETRON HCL 4 MG/2ML IJ SOLN: 4.0000 mg | Freq: Once | INTRAMUSCULAR | Status: DC | PRN

## 2015-01-10 MED ORDER — LIDOCAINE HCL (CARDIAC) 20 MG/ML IV SOLN
4.0000 mg | Freq: Once | INTRAVENOUS | Status: AC
  Administered 2015-01-10: 4 mg via INTRAVENOUS

## 2015-01-10 NOTE — H&P (Signed)
Beverly Cole is an 54 y.o. female.   Chief Complaint: Patient continues to be depressed. No active suicidal thoughts. No significant new complaints other than some nausea today. Patient HPI: Patient has been started with ECT treatment for severe major depression  Past Medical History  Diagnosis Date  . DEPRESSION   . GERD   . HYPERLIPIDEMIA   . MYALGIA   . BIPOLAR DISORDER UNSPECIFIED   . IRRITABLE BOWEL SYNDROME, HX OF   . Hypertension   . Atrial tachycardia ([place])   . Hypertension   . Bursitis of hip 09/1999    Bilateral - Dr. Berenice Primas  . Shortness of breath     Pulmonary eval 05/2002  . Mitral valve prolapse 12/17/2000    cardiolyte study 12/2000 neg  . Fistula of female genitalia 03/29/2007    enterovesical fistula, not reapired  . Anxiety   . Menopausal state 01/2012    FSH = 88.5  . Thyroid nodule 2014  . Tremors of nervous system   . Headache(784.0)     was with menstrual cycle. no longer a problem    Past Surgical History  Procedure Laterality Date  . Lipoma (r) side  1990    Right back    Family History  Problem Relation Age of Onset  . Lung cancer Mother   . Hypertension Mother   . Hyperlipidemia Mother   . Cancer Father     Esophageal cancer  . Hyperlipidemia Father   . Depression Father   . Colon cancer Paternal Uncle   . Sarcoidosis Other   . Testicular cancer Other   . Cancer Brother    Social History:  reports that she quit smoking about 18 years ago. Her smoking use included Cigarettes. She has a 25 pack-year smoking history. She has never used smokeless tobacco. She reports that she does not drink alcohol or use illicit drugs.  Allergies:  Allergies  Allergen Reactions  . Depakote [Divalproex Sodium] Other (See Comments)    Patient becomes suicidal when taking this medication.  Also causes tremors. Patient feels that the reactions were due to the dosing being too high.  She may restart taking in the future  . Lopressor [Metoprolol]    Hallucinations hair falls out  . Nickel Rash     (Not in a hospital admission)  No results found for this or any previous visit (from the past 48 hour(s)). No results found.  Review of Systems  Constitutional: Negative.   HENT: Negative.   Eyes: Negative.   Respiratory: Negative.   Cardiovascular: Negative.   Gastrointestinal: Negative.   Musculoskeletal: Negative.   Skin: Negative.   Neurological: Negative.   Psychiatric/Behavioral: Positive for depression. Negative for suicidal ideas, memory loss and substance abuse. The patient is nervous/anxious. The patient does not have insomnia.     Blood pressure 136/58, pulse 89, temperature 96.8 F (36 C), temperature source Oral, resp. rate 18, height 5' 8.5" (1.74 m), weight 76.658 kg (169 lb), last menstrual period 05/11/2012, SpO2 98 %. Physical Exam  Nursing note and vitals reviewed. Constitutional: She appears well-developed and well-nourished.  HENT:  Head: Normocephalic and atraumatic.  Eyes: Conjunctivae are normal. Pupils are equal, round, and reactive to light.  Neck: Normal range of motion.  Cardiovascular: Normal rate, regular rhythm and normal heart sounds.   Respiratory: Effort normal and breath sounds normal. No respiratory distress.  GI: Soft.  Musculoskeletal: Normal range of motion.  Neurological: She is alert.  Skin: Skin is warm and dry.  Psychiatric: Judgment and thought content normal. Her speech is delayed. She is slowed. Cognition and memory are normal. She exhibits a depressed mood.     Assessment/Plan Continue with index course of 3 times a week as scheduled  Makani Seckman 01/10/2015, 10:38 AM

## 2015-01-10 NOTE — Anesthesia Procedure Notes (Signed)
Date/Time: 01/10/2015 10:47 AM Performed by: Dionne Bucy Pre-anesthesia Checklist: Patient identified, Emergency Drugs available, Suction available and Patient being monitored Patient Re-evaluated:Patient Re-evaluated prior to inductionOxygen Delivery Method: Circle system utilized Preoxygenation: Pre-oxygenation with 100% oxygen Intubation Type: IV induction Ventilation: Mask ventilation without difficulty and Mask ventilation throughout procedure Airway Equipment and Method: Bite block Placement Confirmation: positive ETCO2 Dental Injury: Teeth and Oropharynx as per pre-operative assessment

## 2015-01-10 NOTE — Procedures (Signed)
ECT SERVICES Physician's Interval Evaluation & Treatment Note  Patient Identification: Beverly Cole MRN:  [id number] Date of Evaluation:  01/10/2015 TX #: 2  MADRS:   MMSE:   P.E. Findings:  A little nausea without vomiting this morning no other physical complaints no change to exam  Psychiatric Interval Note:  Mood continues to be depressed significant change no memory complaints  Subjective:  Patient is a 54 y.o. female seen for evaluation for Electroconvulsive Therapy. Sick to her stomach. Some pain after last treatment  Treatment Summary:   [x]   Right Unilateral             []  Bilateral   % Energy : 0.3 ms 100%   Impedance: 1230 ohms  Seizure Energy Index: 3690 V squared  Postictal Suppression Index: 42%  Seizure Concordance Index: 80%  Medications  Pre Shock: Xylocaine 4 mg, Toradol 30 mg, Zofran 4 mg, Brevital 80 mg, succinylcholine 100 mg  Post Shock: None  Seizure Duration: 8 seconds by EMG, 20 seconds by EEG   Comments: Change and aesthetic to 100 mg of ketamine. Follow-up treatment Friday.   Lungs:  [x]   Clear to auscultation               []  Other:   Heart:    [x]   Regular rhythm             []  irregular rhythm    [x]   Previous H&P reviewed, patient examined and there are NO CHANGES                 []   Previous H&P reviewed, patient examined and there are changes noted.   Alethia Berthold, MD 11/30/201610:41 AM

## 2015-01-10 NOTE — Transfer of Care (Signed)
Immediate Anesthesia Transfer of Care Note  Patient: Beverly Cole  Procedure(s) Performed: ECT  Patient Location: PACU  Anesthesia Type:General  Level of Consciousness: awake and patient cooperative  Airway & Oxygen Therapy: Patient Spontanous Breathing and Patient connected to face mask oxygen  Post-op Assessment: Report given to RN  Post vital signs: Reviewed and stable  Last Vitals:  Filed Vitals:   01/10/15 0841 01/10/15 1059  BP: 136/58   Pulse: 89 96  Temp: 36 C 36.68F  Resp: 18 27    Complications: No apparent anesthesia complications

## 2015-01-10 NOTE — Telephone Encounter (Signed)
Called for prior authorization of ECT treatment. Was told by [facility] that no authorization is required for outpatient ECT.

## 2015-01-10 NOTE — Anesthesia Postprocedure Evaluation (Signed)
Anesthesia Post Note  Patient: Beverly Cole  Procedure(s) Performed: * No procedures listed *  Patient location during evaluation: PACU Anesthesia Type: General Level of consciousness: awake Pain management: pain level controlled Respiratory status: spontaneous breathing Cardiovascular status: stable Anesthetic complications: no    Last Vitals:  Filed Vitals:   01/10/15 1101 01/10/15 1131  BP:  128/72  Pulse:  82  Temp: 37.6 C   Resp:  20    Last Pain:  Filed Vitals:   01/10/15 1133  PainSc: 0-No pain                 Marcia Lepera S

## 2015-01-10 NOTE — Anesthesia Preprocedure Evaluation (Signed)
Anesthesia Evaluation  Patient identified by MRN, date of birth, ID band Patient awake    Reviewed: Allergy & Precautions, NPO status , Patient's Chart, lab work & pertinent test results, reviewed documented beta blocker date and time   Airway Mallampati: II  TM Distance: >3 FB Neck ROM: Full    Dental  (+) Chipped   Pulmonary former smoker,    Pulmonary exam normal        Cardiovascular Exercise Tolerance: Good hypertension, Pt. on medications and Pt. on home beta blockers Normal cardiovascular exam     Neuro/Psych PSYCHIATRIC DISORDERS Anxiety Depression Bipolar Disorder    GI/Hepatic GERD  Medicated and Controlled,  Endo/Other  Hypothyroidism Hx of Grave's ds.  Renal/GU      Musculoskeletal   Abdominal Normal abdominal exam  (+)   Peds  Hematology   Anesthesia Other Findings   Reproductive/Obstetrics                             Anesthesia Physical Anesthesia Plan  ASA: II  Anesthesia Plan: General   Post-op Pain Management:    Induction: Intravenous  Airway Management Planned: Mask  Additional Equipment:   Intra-op Plan:   Post-operative Plan:   Informed Consent: I have reviewed the patients History and Physical, chart, labs and discussed the procedure including the risks, benefits and alternatives for the proposed anesthesia with the patient or authorized representative who has indicated his/her understanding and acceptance.     Plan Discussed with: CRNA  Anesthesia Plan Comments:         Anesthesia Quick Evaluation

## 2015-01-12 ENCOUNTER — Inpatient Hospital Stay: Admission: RE | Admit: 2015-01-12 | Payer: 59 | Source: Ambulatory Visit

## 2015-01-15 ENCOUNTER — Encounter: Payer: 59 | Admitting: Anesthesiology

## 2015-01-15 ENCOUNTER — Encounter
Admission: RE | Admit: 2015-01-15 | Discharge: 2015-01-15 | Disposition: A | Discharge status: Home / Self Care | Payer: 59 | Source: Ambulatory Visit | Attending: Psychiatry | Admitting: Psychiatry

## 2015-01-15 ENCOUNTER — Encounter: Payer: Self-pay | Admitting: Anesthesiology

## 2015-01-15 DIAGNOSIS — Z87891 Personal history of nicotine dependence: Secondary | ICD-10-CM | POA: Insufficient documentation

## 2015-01-15 DIAGNOSIS — R110 Nausea: Secondary | ICD-10-CM | POA: Diagnosis not present

## 2015-01-15 DIAGNOSIS — F333 Major depressive disorder, recurrent, severe with psychotic symptoms: Secondary | ICD-10-CM | POA: Insufficient documentation

## 2015-01-15 DIAGNOSIS — K589 Irritable bowel syndrome without diarrhea: Secondary | ICD-10-CM | POA: Diagnosis not present

## 2015-01-15 DIAGNOSIS — I341 Nonrheumatic mitral (valve) prolapse: Secondary | ICD-10-CM | POA: Insufficient documentation

## 2015-01-15 DIAGNOSIS — E785 Hyperlipidemia, unspecified: Secondary | ICD-10-CM | POA: Insufficient documentation

## 2015-01-15 DIAGNOSIS — F419 Anxiety disorder, unspecified: Secondary | ICD-10-CM | POA: Insufficient documentation

## 2015-01-15 DIAGNOSIS — I10 Essential (primary) hypertension: Secondary | ICD-10-CM | POA: Diagnosis not present

## 2015-01-15 DIAGNOSIS — F319 Bipolar disorder, unspecified: Secondary | ICD-10-CM | POA: Insufficient documentation

## 2015-01-15 DIAGNOSIS — Z888 Allergy status to other drugs, medicaments and biological substances status: Secondary | ICD-10-CM | POA: Diagnosis not present

## 2015-01-15 DIAGNOSIS — F332 Major depressive disorder, recurrent severe without psychotic features: Secondary | ICD-10-CM | POA: Diagnosis not present

## 2015-01-15 DIAGNOSIS — K219 Gastro-esophageal reflux disease without esophagitis: Secondary | ICD-10-CM | POA: Insufficient documentation

## 2015-01-15 MED ORDER — KETOROLAC TROMETHAMINE 30 MG/ML IJ SOLN
30.0000 mg | Freq: Once | INTRAMUSCULAR | Status: AC
  Administered 2015-01-15: 30 mg via INTRAVENOUS

## 2015-01-15 MED ORDER — ONDANSETRON HCL 4 MG/2ML IJ SOLN
4.0000 mg | Freq: Once | INTRAMUSCULAR | Status: AC
  Administered 2015-01-15: 4 mg via INTRAVENOUS

## 2015-01-15 MED ORDER — DEXTROSE 5 % IV SOLN
INTRAVENOUS | Status: DC | PRN
  Administered 2015-01-15: 11:00:00 via INTRAVENOUS

## 2015-01-15 MED ORDER — KETAMINE HCL 10 MG/ML IJ SOLN
100.0000 mg | Freq: Once | INTRAMUSCULAR | Status: AC
  Administered 2015-01-15: 100 mg via INTRAVENOUS

## 2015-01-15 MED ORDER — PROMETHAZINE HCL 25 MG/ML IJ SOLN
12.5000 mg | INTRAMUSCULAR | Status: AC
  Administered 2015-01-15: 12.5 mg via INTRAVENOUS

## 2015-01-15 MED ORDER — LIDOCAINE HCL (CARDIAC) 20 MG/ML IV SOLN
4.0000 mg | Freq: Once | INTRAVENOUS | Status: AC
  Administered 2015-01-15: 4 mg via INTRAVENOUS

## 2015-01-15 MED ORDER — SUCCINYLCHOLINE CHLORIDE 20 MG/ML IJ SOLN
100.0000 mg | Freq: Once | INTRAMUSCULAR | Status: AC
  Administered 2015-01-15: 100 mg via INTRAVENOUS

## 2015-01-15 MED ORDER — DEXTROSE 5 % IV SOLN
250.0000 mL | Freq: Once | INTRAVENOUS | Status: AC
  Administered 2015-01-15: 250 mL via INTRAVENOUS

## 2015-01-15 NOTE — Transfer of Care (Signed)
Immediate Anesthesia Transfer of Care Note  Patient: Beverly Cole  Procedure(s) Performed: ECT  Patient Location: PACU  Anesthesia Type:General  Level of Consciousness: awake  Airway & Oxygen Therapy: Patient Spontanous Breathing and Patient connected to face mask oxygen  Post-op Assessment: Report given to RN and Post -op Vital signs reviewed and stable  Post vital signs: stable  Last Vitals:  Filed Vitals:   01/15/15 0926 01/15/15 1135  BP: 123/65 141/71  Pulse: 75 92  Temp: 37 C 36.6 C  Resp: 19 19    Complications: No apparent anesthesia complications

## 2015-01-15 NOTE — Procedures (Signed)
ECT SERVICES Physician's Interval Evaluation & Treatment Note  Patient Identification: Beverly Cole MRN:  [id number] Date of Evaluation:  01/15/2015 TX #: 3  MADRS:   MMSE:   P.E. Findings:  Patient is feeling a little bit more nauseated before treatment today and was given Phenergan 12.5 mg intravenous. Otherwise no new complaints. Physically stable  Psychiatric Interval Note:  Mood is feeling a little bit better already. No suicidal thoughts no psychosis. Still lying anxiety mostly focused on the treatment  Subjective:  Patient is a 54 y.o. female seen for evaluation for Electroconvulsive Therapy. Other than the nausea and anxiety no other new complaints  Treatment Summary:   [x]   Right Unilateral             []  Bilateral   % Energy : 0.3 ms 100%   Impedance: 1630 ohms  Seizure Energy Index: 8503 V squared  Postictal Suppression Index: 47%  Seizure Concordance Index: 97%  Medications  Pre Shock: Xylocaine 4 mg, Toradol 30 mg, Zofran 4 mg, ketamine's 100 mg succinylcholine 100 mg, also Phenergan 12.5 mg given before coming into the room  Post Shock: None  Seizure Duration: 29 seconds by EMG 82 seconds by EEG   Comments: Follow-up Wednesday   Lungs:  [x]   Clear to auscultation               []  Other:   Heart:    [x]   Regular rhythm             []  irregular rhythm    [x]   Previous H&P reviewed, patient examined and there are NO CHANGES                 []   Previous H&P reviewed, patient examined and there are changes noted.   Alethia Berthold, MD 12/5/201611:20 AM

## 2015-01-15 NOTE — H&P (Signed)
Beverly Cole is an 54 y.o. female.   Chief Complaint: Patient feels better. Having some nausea persistently. Mood is improved. HPI: Patient receiving ECT treatment for severe depression. Having some nausea today.  Past Medical History  Diagnosis Date  . DEPRESSION   . GERD   . HYPERLIPIDEMIA   . MYALGIA   . BIPOLAR DISORDER UNSPECIFIED   . IRRITABLE BOWEL SYNDROME, HX OF   . Hypertension   . Atrial tachycardia ([place])   . Hypertension   . Bursitis of hip 09/1999    Bilateral - Dr. Berenice Primas  . Shortness of breath     Pulmonary eval 05/2002  . Mitral valve prolapse 12/17/2000    cardiolyte study 12/2000 neg  . Fistula of female genitalia 03/29/2007    enterovesical fistula, not reapired  . Anxiety   . Menopausal state 01/2012    FSH = 88.5  . Thyroid nodule 2014  . Tremors of nervous system   . Headache(784.0)     was with menstrual cycle. no longer a problem    Past Surgical History  Procedure Laterality Date  . Lipoma (r) side  1990    Right back    Family History  Problem Relation Age of Onset  . Lung cancer Mother   . Hypertension Mother   . Hyperlipidemia Mother   . Cancer Father     Esophageal cancer  . Hyperlipidemia Father   . Depression Father   . Colon cancer Paternal Uncle   . Sarcoidosis Other   . Testicular cancer Other   . Cancer Brother    Social History:  reports that she quit smoking about 18 years ago. Her smoking use included Cigarettes. She has a 25 pack-year smoking history. She has never used smokeless tobacco. She reports that she does not drink alcohol or use illicit drugs.  Allergies:  Allergies  Allergen Reactions  . Depakote [Divalproex Sodium] Other (See Comments)    Patient becomes suicidal when taking this medication.  Also causes tremors. Patient feels that the reactions were due to the dosing being too high.  She may restart taking in the future  . Lopressor [Metoprolol]     Hallucinations hair falls out  . Nickel Rash     (Not  in a hospital admission)  No results found for this or any previous visit (from the past 48 hour(s)). No results found.  Review of Systems  Constitutional: Negative.   HENT: Negative.   Eyes: Negative.   Respiratory: Negative.   Cardiovascular: Negative.   Gastrointestinal: Negative.   Musculoskeletal: Negative.   Skin: Negative.   Neurological: Negative.   Psychiatric/Behavioral: Negative for depression, suicidal ideas, hallucinations, memory loss and substance abuse. The patient is nervous/anxious. The patient does not have insomnia.     Blood pressure 123/65, pulse 75, temperature 98.6 F (37 C), temperature source Oral, resp. rate 19, height 5\' 8"  (1.727 m), weight 77.565 kg (171 lb), last menstrual period 05/11/2012, SpO2 98 %. Physical Exam  Nursing note and vitals reviewed. Constitutional: She appears well-developed and well-nourished.  HENT:  Head: Normocephalic and atraumatic.  Eyes: Conjunctivae are normal. Pupils are equal, round, and reactive to light.  Neck: Normal range of motion.  Cardiovascular: Normal rate, regular rhythm and normal heart sounds.   Respiratory: Effort normal and breath sounds normal. No respiratory distress.  GI: Soft.  Musculoskeletal: Normal range of motion.  Neurological: She is alert.  Skin: Skin is warm and dry.  Psychiatric: She has a normal mood and  affect. Her speech is normal and behavior is normal. Judgment and thought content normal. Cognition and memory are normal.     Assessment/Plan Patient was given Phenergan for treatment today rest of the treatment as usual. Follow-up on Wednesday.  Eladia Frame 01/15/2015, 11:14 AM

## 2015-01-15 NOTE — Anesthesia Preprocedure Evaluation (Signed)
Anesthesia Evaluation    Airway Mallampati: II       Dental  (+) Teeth Intact   Pulmonary shortness of breath and with exertion, former smoker,           Cardiovascular hypertension,  Rhythm:Regular Rate:Normal     Neuro/Psych  Headaches, PSYCHIATRIC DISORDERS Anxiety Depression Bipolar Disorder  Neuromuscular disease    GI/Hepatic GERD  ,  Endo/Other  Hypothyroidism   Renal/GU      Musculoskeletal   Abdominal   Peds  Hematology   Anesthesia Other Findings   Reproductive/Obstetrics                             Anesthesia Physical Anesthesia Plan  ASA: III  Anesthesia Plan: General   Post-op Pain Management:    Induction:   Airway Management Planned:   Additional Equipment:   Intra-op Plan:   Post-operative Plan:   Informed Consent:   Plan Discussed with:   Anesthesia Plan Comments:         Anesthesia Quick Evaluation

## 2015-01-16 NOTE — Anesthesia Postprocedure Evaluation (Signed)
Anesthesia Post Note  Patient: Beverly Cole  Procedure(s) Performed: * No procedures listed *  Patient location during evaluation: PACU Anesthesia Type: General Level of consciousness: awake and alert Pain management: pain level controlled Vital Signs Assessment: post-procedure vital signs reviewed and stable Respiratory status: spontaneous breathing, nonlabored ventilation, respiratory function stable and patient connected to nasal cannula oxygen Cardiovascular status: blood pressure returned to baseline and stable Postop Assessment: no signs of nausea or vomiting Anesthetic complications: no    Last Vitals:  Filed Vitals:   01/15/15 1203 01/15/15 1212  BP: 120/61 122/62  Pulse: 78 79  Temp:    Resp: 20 18    Last Pain: There were no vitals filed for this visit.               Molli Barrows

## 2015-01-17 ENCOUNTER — Encounter
Admission: RE | Admit: 2015-01-17 | Discharge: 2015-01-17 | Disposition: A | Discharge status: Home / Self Care | Payer: 59 | Source: Ambulatory Visit | Attending: Psychiatry | Admitting: Psychiatry

## 2015-01-17 ENCOUNTER — Encounter: Payer: Self-pay | Admitting: Anesthesiology

## 2015-01-17 DIAGNOSIS — F322 Major depressive disorder, single episode, severe without psychotic features: Secondary | ICD-10-CM | POA: Insufficient documentation

## 2015-01-17 DIAGNOSIS — Z87891 Personal history of nicotine dependence: Secondary | ICD-10-CM | POA: Diagnosis not present

## 2015-01-17 DIAGNOSIS — F332 Major depressive disorder, recurrent severe without psychotic features: Secondary | ICD-10-CM | POA: Diagnosis not present

## 2015-01-17 DIAGNOSIS — F419 Anxiety disorder, unspecified: Secondary | ICD-10-CM | POA: Diagnosis not present

## 2015-01-17 DIAGNOSIS — E785 Hyperlipidemia, unspecified: Secondary | ICD-10-CM | POA: Insufficient documentation

## 2015-01-17 DIAGNOSIS — F319 Bipolar disorder, unspecified: Secondary | ICD-10-CM | POA: Insufficient documentation

## 2015-01-17 DIAGNOSIS — I341 Nonrheumatic mitral (valve) prolapse: Secondary | ICD-10-CM | POA: Diagnosis not present

## 2015-01-17 DIAGNOSIS — I10 Essential (primary) hypertension: Secondary | ICD-10-CM | POA: Diagnosis not present

## 2015-01-17 DIAGNOSIS — Z888 Allergy status to other drugs, medicaments and biological substances status: Secondary | ICD-10-CM | POA: Insufficient documentation

## 2015-01-17 DIAGNOSIS — Z9109 Other allergy status, other than to drugs and biological substances: Secondary | ICD-10-CM | POA: Diagnosis not present

## 2015-01-17 DIAGNOSIS — K589 Irritable bowel syndrome without diarrhea: Secondary | ICD-10-CM | POA: Diagnosis not present

## 2015-01-17 DIAGNOSIS — K219 Gastro-esophageal reflux disease without esophagitis: Secondary | ICD-10-CM | POA: Insufficient documentation

## 2015-01-17 MED ORDER — LIDOCAINE HCL (CARDIAC) 20 MG/ML IV SOLN
4.0000 mg | Freq: Once | INTRAVENOUS | Status: AC
  Administered 2015-01-17: 4 mg via INTRAVENOUS

## 2015-01-17 MED ORDER — KETAMINE HCL 10 MG/ML IJ SOLN
100.0000 mg | Freq: Once | INTRAMUSCULAR | Status: AC
  Administered 2015-01-17: 100 mg via INTRAVENOUS

## 2015-01-17 MED ORDER — DEXTROSE 5 % IV SOLN
250.0000 mL | Freq: Once | INTRAVENOUS | Status: AC
  Administered 2015-01-17: 250 mL via INTRAVENOUS

## 2015-01-17 MED ORDER — PROMETHAZINE HCL 25 MG/ML IJ SOLN
12.5000 mg | Freq: Once | INTRAMUSCULAR | Status: AC
  Administered 2015-01-17: 12.5 mg via INTRAVENOUS

## 2015-01-17 MED ORDER — KETOROLAC TROMETHAMINE 30 MG/ML IJ SOLN
30.0000 mg | Freq: Once | INTRAMUSCULAR | Status: AC
  Administered 2015-01-17: 30 mg via INTRAVENOUS

## 2015-01-17 MED ORDER — SUCCINYLCHOLINE CHLORIDE 20 MG/ML IJ SOLN
100.0000 mg | Freq: Once | INTRAMUSCULAR | Status: AC
  Administered 2015-01-17: 100 mg via INTRAVENOUS

## 2015-01-17 MED ORDER — ONDANSETRON HCL 4 MG/2ML IJ SOLN
4.0000 mg | Freq: Once | INTRAMUSCULAR | Status: AC
  Administered 2015-01-17: 4 mg via INTRAVENOUS

## 2015-01-17 NOTE — Transfer of Care (Signed)
Immediate Anesthesia Transfer of Care Note  Patient: Beverly Cole  Procedure(s) Performed: ECT  Patient Location: PACU  Anesthesia Type:General  Level of Consciousness: sedated  Airway & Oxygen Therapy: Patient Spontanous Breathing and Patient connected to face mask oxygen  Post-op Assessment: Report given to RN  Post vital signs: Reviewed and stable  Last Vitals:  Filed Vitals:   01/17/15 1029 01/17/15 1030  BP: 148/81 148/81  Pulse: 93 92  Temp: 37.4 C 99.56F  Resp: 18 18    Complications: No apparent anesthesia complications

## 2015-01-17 NOTE — Procedures (Signed)
ECT SERVICES Physician's Interval Evaluation & Treatment Note  Patient Identification: Beverly Cole MRN:  [id number] Date of Evaluation:  01/17/2015 TX #: 4  MADRS: 14  MMSE: 30  P.E. Findings:  No change to physical exam but complains of much less nausea than before  Psychiatric Interval Note:  Mood is subjectively better affect is brighter no complaints of memory loss  Subjective:  Patient is a 54 y.o. female seen for evaluation for Electroconvulsive Therapy. Overall feeling improved  Treatment Summary:   [x]   Right Unilateral             []  Bilateral   % Energy : 0.3 ms 100%   Impedance: 990 ohms  Seizure Energy Index: 6486 V squared  Postictal Suppression Index: 33%  Seizure Concordance Index: 91%  Medications  Pre Shock: Xylocaine 4 mg, Toradol 30 mg, Zofran 4 mg, ketamine 100 mg, succinylcholine 100 mg, with 12.5 mg of Phenergan given in the preop area  Post Shock: None  Seizure Duration: 24 seconds by EMG, 45 seconds by EEG   Comments: Patient is showing significant improvement after just 4 treatments with minimal side effects. She is on the schedule for Friday and then we will reevaluate whether we may be at a point of starting tapering. No other change to treatment plan right now. Patient agrees.   Lungs:  [x]   Clear to auscultation               []  Other:   Heart:    [x]   Regular rhythm             []  irregular rhythm    [x]   Previous H&P reviewed, patient examined and there are NO CHANGES                 []   Previous H&P reviewed, patient examined and there are changes noted.   Alethia Berthold, MD 12/7/201610:12 AM

## 2015-01-17 NOTE — H&P (Signed)
Beverly Cole is an 54 y.o. female.   Chief Complaint: Patient is feeling better. Nausea is much improved. Phenergan seems to of helped last time. No new physical complaints. HPI: Receiving ECT treatment for treatment of severe major depression  Past Medical History  Diagnosis Date  . DEPRESSION   . GERD   . HYPERLIPIDEMIA   . MYALGIA   . BIPOLAR DISORDER UNSPECIFIED   . IRRITABLE BOWEL SYNDROME, HX OF   . Hypertension   . Atrial tachycardia ([place])   . Hypertension   . Bursitis of hip 09/1999    Bilateral - Dr. Berenice Primas  . Shortness of breath     Pulmonary eval 05/2002  . Mitral valve prolapse 12/17/2000    cardiolyte study 12/2000 neg  . Fistula of female genitalia 03/29/2007    enterovesical fistula, not reapired  . Anxiety   . Menopausal state 01/2012    FSH = 88.5  . Thyroid nodule 2014  . Tremors of nervous system   . Headache(784.0)     was with menstrual cycle. no longer a problem    Past Surgical History  Procedure Laterality Date  . Lipoma (r) side  1990    Right back    Family History  Problem Relation Age of Onset  . Lung cancer Mother   . Hypertension Mother   . Hyperlipidemia Mother   . Cancer Father     Esophageal cancer  . Hyperlipidemia Father   . Depression Father   . Colon cancer Paternal Uncle   . Sarcoidosis Other   . Testicular cancer Other   . Cancer Brother    Social History:  reports that she quit smoking about 18 years ago. Her smoking use included Cigarettes. She has a 25 pack-year smoking history. She has never used smokeless tobacco. She reports that she does not drink alcohol or use illicit drugs.  Allergies:  Allergies  Allergen Reactions  . Depakote [Divalproex Sodium] Other (See Comments)    Patient becomes suicidal when taking this medication.  Also causes tremors. Patient feels that the reactions were due to the dosing being too high.  She may restart taking in the future  . Lopressor [Metoprolol]     Hallucinations hair falls out   . Nickel Rash     (Not in a hospital admission)  No results found for this or any previous visit (from the past 48 hour(s)). No results found.  Review of Systems  Constitutional: Negative.   HENT: Negative.   Eyes: Negative.   Respiratory: Negative.   Cardiovascular: Negative.   Gastrointestinal: Negative.  Negative for nausea.  Musculoskeletal: Negative.   Skin: Negative.   Neurological: Negative.   Psychiatric/Behavioral: Negative for depression, suicidal ideas, hallucinations, memory loss and substance abuse. The patient is not nervous/anxious and does not have insomnia.     Blood pressure 118/57, pulse 70, temperature 97.3 F (36.3 C), temperature source Oral, resp. rate 18, height 5\' 8"  (1.727 m), weight 78.019 kg (172 lb), last menstrual period 05/11/2012, SpO2 99 %. Physical Exam  Nursing note and vitals reviewed. Constitutional: She appears well-developed and well-nourished.  HENT:  Head: Normocephalic and atraumatic.  Eyes: Conjunctivae are normal. Pupils are equal, round, and reactive to light.  Neck: Normal range of motion.  Cardiovascular: Normal rate, regular rhythm and normal heart sounds.   Respiratory: Effort normal and breath sounds normal. No respiratory distress.  GI: Soft.  Musculoskeletal: Normal range of motion.  Neurological: She is alert.  Skin: Skin is warm  and dry.  Psychiatric: She has a normal mood and affect. Her speech is normal and behavior is normal. Judgment and thought content normal. Cognition and memory are normal.     Assessment/Plan Patient is tolerating treatment well. This is treatment #4. Significant improvement so far. May be able to start tapering treatments soon.  Tyjuan Demetro 01/17/2015, 10:10 AM

## 2015-01-17 NOTE — Anesthesia Preprocedure Evaluation (Signed)
Anesthesia Evaluation  Patient identified by MRN, date of birth, ID band Patient awake    Reviewed: Allergy & Precautions, NPO status , Patient's Chart, lab work & pertinent test results  Airway Mallampati: II       Dental  (+) Teeth Intact   Pulmonary shortness of breath and with exertion, former smoker,           Cardiovascular hypertension,  Rhythm:Regular Rate:Normal     Neuro/Psych  Headaches, PSYCHIATRIC DISORDERS Anxiety Depression Bipolar Disorder  Neuromuscular disease    GI/Hepatic GERD  ,  Endo/Other  Hypothyroidism   Renal/GU      Musculoskeletal   Abdominal   Peds  Hematology   Anesthesia Other Findings   Reproductive/Obstetrics                             Anesthesia Physical  Anesthesia Plan  ASA: III  Anesthesia Plan: General   Post-op Pain Management:    Induction:   Airway Management Planned:   Additional Equipment:   Intra-op Plan:   Post-operative Plan:   Informed Consent:   Plan Discussed with:   Anesthesia Plan Comments:         Anesthesia Quick Evaluation

## 2015-01-17 NOTE — Anesthesia Procedure Notes (Signed)
Date/Time: 01/17/2015 10:19 AM Performed by: Dionne Bucy Pre-anesthesia Checklist: Patient identified, Emergency Drugs available, Suction available and Patient being monitored Patient Re-evaluated:Patient Re-evaluated prior to inductionOxygen Delivery Method: Circle system utilized Preoxygenation: Pre-oxygenation with 100% oxygen Intubation Type: IV induction Ventilation: Mask ventilation without difficulty and Mask ventilation throughout procedure Airway Equipment and Method: Bite block Placement Confirmation: positive ETCO2 Dental Injury: Teeth and Oropharynx as per pre-operative assessment

## 2015-01-17 NOTE — Anesthesia Postprocedure Evaluation (Signed)
Anesthesia Post Note  Patient: Beverly Cole  Procedure(s) Performed: * No procedures listed *  Patient location during evaluation: PACU Anesthesia Type: General Level of consciousness: awake and alert Pain management: pain level controlled Vital Signs Assessment: post-procedure vital signs reviewed and stable Respiratory status: spontaneous breathing and respiratory function stable Cardiovascular status: blood pressure returned to baseline and stable Anesthetic complications: no    Last Vitals:  Filed Vitals:   01/17/15 1054 01/17/15 1106  BP: 133/84 122/76  Pulse:  66  Temp: 37.4 C   Resp: 19 18    Last Pain:  Filed Vitals:   01/17/15 1107  PainSc: 0-No pain                 KEPHART,WILLIAM K

## 2015-01-19 ENCOUNTER — Encounter
Admission: RE | Admit: 2015-01-19 | Discharge: 2015-01-19 | Disposition: A | Discharge status: Home / Self Care | Payer: 59 | Source: Ambulatory Visit | Attending: Family Medicine | Admitting: Family Medicine

## 2015-01-19 ENCOUNTER — Encounter: Payer: Self-pay | Admitting: Anesthesiology

## 2015-01-19 DIAGNOSIS — E785 Hyperlipidemia, unspecified: Secondary | ICD-10-CM | POA: Diagnosis not present

## 2015-01-19 DIAGNOSIS — I10 Essential (primary) hypertension: Secondary | ICD-10-CM | POA: Diagnosis not present

## 2015-01-19 DIAGNOSIS — F419 Anxiety disorder, unspecified: Secondary | ICD-10-CM | POA: Insufficient documentation

## 2015-01-19 DIAGNOSIS — Z87891 Personal history of nicotine dependence: Secondary | ICD-10-CM | POA: Insufficient documentation

## 2015-01-19 DIAGNOSIS — K589 Irritable bowel syndrome without diarrhea: Secondary | ICD-10-CM | POA: Diagnosis not present

## 2015-01-19 DIAGNOSIS — Z9109 Other allergy status, other than to drugs and biological substances: Secondary | ICD-10-CM | POA: Insufficient documentation

## 2015-01-19 DIAGNOSIS — Z888 Allergy status to other drugs, medicaments and biological substances status: Secondary | ICD-10-CM | POA: Insufficient documentation

## 2015-01-19 DIAGNOSIS — F339 Major depressive disorder, recurrent, unspecified: Secondary | ICD-10-CM | POA: Diagnosis not present

## 2015-01-19 DIAGNOSIS — F319 Bipolar disorder, unspecified: Secondary | ICD-10-CM | POA: Diagnosis not present

## 2015-01-19 DIAGNOSIS — F332 Major depressive disorder, recurrent severe without psychotic features: Secondary | ICD-10-CM | POA: Diagnosis not present

## 2015-01-19 DIAGNOSIS — I341 Nonrheumatic mitral (valve) prolapse: Secondary | ICD-10-CM | POA: Insufficient documentation

## 2015-01-19 DIAGNOSIS — K219 Gastro-esophageal reflux disease without esophagitis: Secondary | ICD-10-CM | POA: Diagnosis not present

## 2015-01-19 MED ORDER — KETOROLAC TROMETHAMINE 30 MG/ML IJ SOLN
30.0000 mg | Freq: Once | INTRAMUSCULAR | Status: AC
  Administered 2015-01-19: 30 mg via INTRAVENOUS

## 2015-01-19 MED ORDER — PROMETHAZINE HCL 25 MG/ML IJ SOLN
12.5000 mg | Freq: Once | INTRAMUSCULAR | Status: AC
  Administered 2015-01-19: 12.5 mg via INTRAVENOUS

## 2015-01-19 MED ORDER — DEXTROSE 5 % IV SOLN
250.0000 mL | Freq: Once | INTRAVENOUS | Status: AC
  Administered 2015-01-19: 250 mL via INTRAVENOUS

## 2015-01-19 MED ORDER — SUCCINYLCHOLINE CHLORIDE 20 MG/ML IJ SOLN
100.0000 mg | Freq: Once | INTRAMUSCULAR | Status: AC
  Administered 2015-01-19: 100 mg via INTRAVENOUS

## 2015-01-19 MED ORDER — KETAMINE HCL 10 MG/ML IJ SOLN
100.0000 mg | Freq: Once | INTRAMUSCULAR | Status: AC
  Administered 2015-01-19: 100 mg via INTRAVENOUS

## 2015-01-19 MED ORDER — ONDANSETRON HCL 4 MG/2ML IJ SOLN
4.0000 mg | Freq: Once | INTRAMUSCULAR | Status: AC
  Administered 2015-01-19: 4 mg via INTRAVENOUS

## 2015-01-19 MED ORDER — LIDOCAINE HCL (CARDIAC) 20 MG/ML IV SOLN
4.0000 mg | Freq: Once | INTRAVENOUS | Status: AC
  Administered 2015-01-19: 4 mg via INTRAVENOUS

## 2015-01-19 NOTE — Transfer of Care (Signed)
Immediate Anesthesia Transfer of Care Note  Patient: Beverly Cole  Procedure(s) Performed: ECT  Patient Location: PACU  Anesthesia Type:General  Level of Consciousness: sedated  Airway & Oxygen Therapy: Patient Spontanous Breathing and Patient connected to face mask oxygen  Post-op Assessment: Report given to RN  Post vital signs: Reviewed and stable  Last Vitals:  Filed Vitals:   01/19/15 0838 01/19/15 1054  BP: 136/72 166/89  Pulse: 66 93  Temp: 35.9 C   Resp: 18 20    Complications: No apparent anesthesia complications

## 2015-01-19 NOTE — Anesthesia Procedure Notes (Signed)
Date/Time: 01/19/2015 10:42 AM Performed by: Dionne Bucy Pre-anesthesia Checklist: Patient identified, Emergency Drugs available, Suction available and Patient being monitored Patient Re-evaluated:Patient Re-evaluated prior to inductionOxygen Delivery Method: Circle system utilized Preoxygenation: Pre-oxygenation with 100% oxygen Intubation Type: IV induction Ventilation: Mask ventilation without difficulty and Mask ventilation throughout procedure Airway Equipment and Method: Bite block Placement Confirmation: positive ETCO2 Dental Injury: Teeth and Oropharynx as per pre-operative assessment

## 2015-01-19 NOTE — H&P (Signed)
Beverly Cole is an 54 y.o. female.   Chief Complaint: Patient feels mood is much improved. No new physical complaint. She is here receiving ECT treatment for recurrent severe depression HPI: Mood continues to be positive since our last treatment with no new physical complaints or onset of memory problems  Past Medical History  Diagnosis Date  . DEPRESSION   . GERD   . HYPERLIPIDEMIA   . MYALGIA   . BIPOLAR DISORDER UNSPECIFIED   . IRRITABLE BOWEL SYNDROME, HX OF   . Hypertension   . Atrial tachycardia ([place])   . Hypertension   . Bursitis of hip 09/1999    Bilateral - Dr. Berenice Primas  . Shortness of breath     Pulmonary eval 05/2002  . Mitral valve prolapse 12/17/2000    cardiolyte study 12/2000 neg  . Fistula of female genitalia 03/29/2007    enterovesical fistula, not reapired  . Anxiety   . Menopausal state 01/2012    FSH = 88.5  . Thyroid nodule 2014  . Tremors of nervous system   . Headache(784.0)     was with menstrual cycle. no longer a problem    Past Surgical History  Procedure Laterality Date  . Lipoma (r) side  1990    Right back    Family History  Problem Relation Age of Onset  . Lung cancer Mother   . Hypertension Mother   . Hyperlipidemia Mother   . Cancer Father     Esophageal cancer  . Hyperlipidemia Father   . Depression Father   . Colon cancer Paternal Uncle   . Sarcoidosis Other   . Testicular cancer Other   . Cancer Brother    Social History:  reports that she quit smoking about 18 years ago. Her smoking use included Cigarettes. She has a 25 pack-year smoking history. She has never used smokeless tobacco. She reports that she does not drink alcohol or use illicit drugs.  Allergies:  Allergies  Allergen Reactions  . Depakote [Divalproex Sodium] Other (See Comments)    Patient becomes suicidal when taking this medication.  Also causes tremors. Patient feels that the reactions were due to the dosing being too high.  She may restart taking in the future   . Lopressor [Metoprolol]     Hallucinations hair falls out  . Nickel Rash     (Not in a hospital admission)  No results found for this or any previous visit (from the past 48 hour(s)). No results found.  Review of Systems  Constitutional: Negative.   HENT: Negative.   Eyes: Negative.   Respiratory: Negative.   Cardiovascular: Negative.   Gastrointestinal: Negative.   Musculoskeletal: Negative.   Skin: Negative.   Neurological: Negative.   Psychiatric/Behavioral: Negative for depression, suicidal ideas, hallucinations, memory loss and substance abuse. The patient is not nervous/anxious and does not have insomnia.     Blood pressure 136/72, pulse 66, temperature 96.7 F (35.9 C), temperature source Oral, resp. rate 18, height 5\' 8"  (1.727 m), weight 78.926 kg (174 lb), last menstrual period 05/11/2012, SpO2 98 %. Physical Exam  Nursing note and vitals reviewed. Constitutional: She appears well-developed and well-nourished.  HENT:  Head: Normocephalic and atraumatic.  Eyes: Conjunctivae are normal. Pupils are equal, round, and reactive to light.  Neck: Normal range of motion.  Cardiovascular: Normal rate, regular rhythm and normal heart sounds.   Respiratory: Effort normal and breath sounds normal. No respiratory distress.  GI: Soft.  Musculoskeletal: Normal range of motion.  Neurological:  She is alert.  Skin: Skin is warm and dry.  Psychiatric: She has a normal mood and affect. Her behavior is normal. Judgment and thought content normal.     Assessment/Plan Patient seems to be doing well and probably plateaued. Advise continued medication management and outpatient therapy. We will begin taper of ECT. Next treatment will be scheduled in one week. She agrees to the plan.  Beverly Cole 01/19/2015, 10:37 AM

## 2015-01-19 NOTE — Procedures (Signed)
ECT SERVICES Physician's Interval Evaluation & Treatment Note  Patient Identification: YAISA PARCHMENT MRN:  [id number] Date of Evaluation:  01/19/2015 TX #: 5  MADRS:   MMSE:   P.E. Findings:  No new physical complaints. Physical exam completely normal vital signs stable  Psychiatric Interval Note:  Mood is reported as being improved. Affect upbeat. No memory problems  Subjective:  Patient is a 54 y.o. female seen for evaluation for Electroconvulsive Therapy. No new complaint  Treatment Summary:   [x]   Right Unilateral             []  Bilateral   % Energy : 0.3 ms 100%   Impedance: 1430 ohms  Seizure Energy Index: 6124 V squared  Postictal Suppression Index: Less than 10%  Seizure Concordance Index: 86%  Medications  Pre Shock: Phenergan 12.5 mg, Xylocaine 4 mg, Toradol 30 mg, Zofran 4 mg, ketamine 100 mg, succinylcholine 100 mg  Post Shock: None  Seizure Duration: 26 seconds by EMG, 38 seconds by EEG   Comments: Follow-up in 1 week on the 16th   Lungs:  [x]   Clear to auscultation               []  Other:   Heart:    [x]   Regular rhythm             []  irregular rhythm    [x]   Previous H&P reviewed, patient examined and there are NO CHANGES                 []   Previous H&P reviewed, patient examined and there are changes noted.   Alethia Berthold, MD 12/9/201610:38 AM   2

## 2015-01-19 NOTE — Anesthesia Preprocedure Evaluation (Signed)
Anesthesia Evaluation  Patient identified by MRN, date of birth, ID band Patient awake    Reviewed: Allergy & Precautions, H&P , NPO status , Patient's Chart, lab work & pertinent test results, reviewed documented beta blocker date and time   Airway Mallampati: II  TM Distance: >3 FB Neck ROM: full    Dental no notable dental hx.    Pulmonary neg pulmonary ROS, shortness of breath and with exertion, former smoker,    Pulmonary exam normal breath sounds clear to auscultation       Cardiovascular Exercise Tolerance: Good hypertension, negative cardio ROS   Rhythm:regular Rate:Normal     Neuro/Psych  Headaches, PSYCHIATRIC DISORDERS  Neuromuscular disease negative neurological ROS  negative psych ROS   GI/Hepatic negative GI ROS, Neg liver ROS, GERD  ,  Endo/Other  negative endocrine ROSHypothyroidism   Renal/GU negative Renal ROS  negative genitourinary   Musculoskeletal   Abdominal   Peds  Hematology negative hematology ROS (+)   Anesthesia Other Findings   Reproductive/Obstetrics negative OB ROS                             Anesthesia Physical Anesthesia Plan  ASA: III  Anesthesia Plan: General   Post-op Pain Management:    Induction:   Airway Management Planned:   Additional Equipment:   Intra-op Plan:   Post-operative Plan:   Informed Consent: I have reviewed the patients History and Physical, chart, labs and discussed the procedure including the risks, benefits and alternatives for the proposed anesthesia with the patient or authorized representative who has indicated his/her understanding and acceptance.   Dental Advisory Given  Plan Discussed with: CRNA  Anesthesia Plan Comments:         Anesthesia Quick Evaluation

## 2015-01-22 NOTE — Anesthesia Postprocedure Evaluation (Signed)
Anesthesia Post Note  Patient: Beverly Cole  Procedure(s) Performed: * No procedures listed *  Patient location during evaluation: PACU Anesthesia Type: General Level of consciousness: awake and alert Pain management: pain level controlled Vital Signs Assessment: post-procedure vital signs reviewed and stable Respiratory status: spontaneous breathing, nonlabored ventilation, respiratory function stable and patient connected to nasal cannula oxygen Cardiovascular status: blood pressure returned to baseline and stable Postop Assessment: no signs of nausea or vomiting Anesthetic complications: no    Last Vitals:  Filed Vitals:   01/19/15 1114 01/19/15 1123  BP: 150/90 137/86  Pulse: 72 67  Temp:    Resp: 15 16    Last Pain: There were no vitals filed for this visit.               Molli Barrows

## 2015-01-26 ENCOUNTER — Telehealth: Payer: Self-pay | Admitting: *Deleted

## 2015-01-26 ENCOUNTER — Inpatient Hospital Stay: Admission: RE | Admit: 2015-01-26 | Payer: 59 | Source: Ambulatory Visit

## 2015-02-07 ENCOUNTER — Telehealth: Payer: Self-pay | Admitting: Family Medicine

## 2015-02-07 DIAGNOSIS — R0789 Other chest pain: Secondary | ICD-10-CM

## 2015-02-07 NOTE — Telephone Encounter (Signed)
[facility] - Client [facility] Patient Name: Beverly Cole DOB: [birth year] Initial Comment Caller States when she lays on her left side her chest hurts, trouble breathing only when she lays down. currently no issues, only when laying down. Nurse Assessment Nurse: Mechele Dawley, RN, Amy Date/Time [name] Time): 02/07/2015 9:25:17 AM Confirm and document reason for call. If symptomatic, describe symptoms. ---STATES THAT WHEN SHE LAYS ON HER LEFT SIDE THAT HER CHEST IS HURTING. ONE WEEK AGO THIS A WEEK AGO. NO SOB. SHE IS HURTING IN THE MIDDLE OF HER CHEST. NO COUGH. NO PROBLEMS LIKE THIS BEFORE. NO CHANGE WITH A DEEP BREATHE. SHE DOES NOT FEEL A SORE SPOT IN HER CHEST. LAST NIGHT IT WAS AROUND A 3/10 AND TODAY NONE. AT NIGHT IS WHEN SHE IS HAVING THE PROBLEM. Has the patient traveled out of the country within the last 30 days? ---Not Applicable Does the patient have any new or worsening symptoms? ---Yes Will a triage be completed? ---Yes Related visit to physician within the last 2 weeks? ---No Does the PT have any chronic conditions? (i.e. diabetes, asthma, etc.) ---No Is this a behavioral health or substance abuse call? ---No Guidelines Guideline Title Affirmed Question Affirmed Notes Chest Pain [1] Chest pain lasting <= 5 minutes AND [2] NO chest pain or cardiac symptoms now (Exceptions: pains lasting a few seconds) Final Disposition User See Physician within [employer], Therapist, sports, Amy Referrals REFERRED TO PCP OFFICE Disagree/Comply: Comply

## 2015-02-08 ENCOUNTER — Ambulatory Visit (HOSPITAL_BASED_OUTPATIENT_CLINIC_OR_DEPARTMENT_OTHER)
Admission: RE | Admit: 2015-02-08 | Discharge: 2015-02-08 | Disposition: A | Discharge status: Home / Self Care | Payer: 59 | Source: Ambulatory Visit | Attending: Family Medicine | Admitting: Family Medicine

## 2015-02-08 ENCOUNTER — Ambulatory Visit (INDEPENDENT_AMBULATORY_CARE_PROVIDER_SITE_OTHER): Payer: 59 | Admitting: Family Medicine

## 2015-02-08 ENCOUNTER — Encounter: Payer: Self-pay | Admitting: Family Medicine

## 2015-02-08 ENCOUNTER — Other Ambulatory Visit: Payer: Self-pay | Admitting: Family Medicine

## 2015-02-08 ENCOUNTER — Ambulatory Visit: Payer: 59 | Admitting: Cardiology

## 2015-02-08 ENCOUNTER — Other Ambulatory Visit: Payer: Self-pay

## 2015-02-08 VITALS — BP 124/82 | HR 94 | Temp 98.0°F | Ht 68.0 in | Wt 170.6 lb

## 2015-02-08 DIAGNOSIS — R079 Chest pain, unspecified: Secondary | ICD-10-CM

## 2015-02-08 DIAGNOSIS — R071 Chest pain on breathing: Secondary | ICD-10-CM

## 2015-02-08 DIAGNOSIS — R7989 Other specified abnormal findings of blood chemistry: Secondary | ICD-10-CM

## 2015-02-08 DIAGNOSIS — R918 Other nonspecific abnormal finding of lung field: Secondary | ICD-10-CM | POA: Insufficient documentation

## 2015-02-08 DIAGNOSIS — R0789 Other chest pain: Secondary | ICD-10-CM

## 2015-02-08 DIAGNOSIS — R0602 Shortness of breath: Secondary | ICD-10-CM

## 2015-02-08 DIAGNOSIS — L700 Acne vulgaris: Secondary | ICD-10-CM | POA: Diagnosis not present

## 2015-02-08 DIAGNOSIS — J90 Pleural effusion, not elsewhere classified: Secondary | ICD-10-CM

## 2015-02-08 DIAGNOSIS — R002 Palpitations: Secondary | ICD-10-CM

## 2015-02-08 DIAGNOSIS — I10 Essential (primary) hypertension: Secondary | ICD-10-CM

## 2015-02-08 LAB — D-DIMER, QUANTITATIVE: D-Dimer, Quant: 0.69 ug/mL-FEU — ABNORMAL HIGH (ref 0.00–0.48)

## 2015-02-08 LAB — TROPONIN I: TNIDX: 0.01 ug/l (ref 0.00–0.06)

## 2015-02-08 MED ORDER — FINACEA 15 % EX FOAM: 1.0000 "application " | Freq: Every day | CUTANEOUS | Status: DC | PRN

## 2015-02-08 MED ORDER — DILTIAZEM HCL ER COATED BEADS 180 MG PO CP24: ORAL_CAPSULE | ORAL | Status: DC

## 2015-02-08 NOTE — Telephone Encounter (Signed)
Spoke with Shirlean Mylar at [place] lab with the results of the D-Dimer and it was 0.69. FYI.

## 2015-02-08 NOTE — Patient Instructions (Signed)

## 2015-02-08 NOTE — Telephone Encounter (Signed)
Spoke with pt and she voices understanding. CT order placed and pt has been notified and will come in tomorrow morning for a CT scan.

## 2015-02-08 NOTE — Progress Notes (Signed)
Pre visit review using our clinic review tool, if applicable. No additional management support is needed unless otherwise documented below in the visit note. 

## 2015-02-08 NOTE — Telephone Encounter (Signed)
See note below. Spoke with ES and he advised how to handle note below.

## 2015-02-08 NOTE — Telephone Encounter (Signed)
This should have been brought to the attention of a provider in the office.  It was elevated.  Pt needs CT chest stat--- I was unable to reach pt on home phone or cell.

## 2015-02-08 NOTE — Progress Notes (Signed)
Patient ID: Beverly Cole, female    DOB: 05/31/1960  Age: 54 y.o. MRN: [id number]    Subjective:  Subjective HPI NGUN SCHEIDEMAN presents for cp and palpitations.  It she lays on her left side she has cp and if she lays on her Right side she is sob.  Pt has had ect x 4 and sob after first tx.  Cp   Review of Systems  Constitutional: Negative for diaphoresis, appetite change, fatigue and unexpected weight change.  Eyes: Negative for pain, redness and visual disturbance.  Respiratory: Positive for chest tightness and shortness of breath. Negative for cough and wheezing.   Cardiovascular: Positive for palpitations. Negative for chest pain and leg swelling.  Endocrine: Negative for cold intolerance, heat intolerance, polydipsia, polyphagia and polyuria.  Genitourinary: Negative for dysuria, frequency and difficulty urinating.  Neurological: Negative for dizziness, light-headedness, numbness and headaches.    History Past Medical History  Diagnosis Date  . DEPRESSION   . GERD   . HYPERLIPIDEMIA   . MYALGIA   . BIPOLAR DISORDER UNSPECIFIED   . IRRITABLE BOWEL SYNDROME, HX OF   . Hypertension   . Atrial tachycardia ([place])   . Hypertension   . Bursitis of hip 09/1999    Bilateral - Dr. Berenice Primas  . Shortness of breath     Pulmonary eval 05/2002  . Mitral valve prolapse 12/17/2000    cardiolyte study 12/2000 neg  . Fistula of female genitalia 03/29/2007    enterovesical fistula, not reapired  . Anxiety   . Menopausal state 01/2012    FSH = 88.5  . Thyroid nodule 2014  . Tremors of nervous system   . Headache(784.0)     was with menstrual cycle. no longer a problem    She has past surgical history that includes Lipoma (R) side (1990).   Her family history includes Cancer in her brother and father; Colon cancer in her paternal uncle; Depression in her father; Hyperlipidemia in her father and mother; Hypertension in her mother; Lung cancer in her mother; Sarcoidosis in her other;  Testicular cancer in her other.She reports that she quit smoking about 19 years ago. Her smoking use included Cigarettes. She has a 25 pack-year smoking history. She has never used smokeless tobacco. She reports that she does not drink alcohol or use illicit drugs.  No current facility-administered medications on file prior to visit.   Current Outpatient Prescriptions on File Prior to Visit  Medication Sig Dispense Refill  . buPROPion (WELLBUTRIN XL) 150 MG 24 hr tablet Take 300 mg by mouth at bedtime.     . Cholecalciferol (VITAMIN D3) 1000 UNITS CAPS Take 1,000 Units by mouth at bedtime.     . divalproex (DEPAKOTE) 500 MG 24 hr tablet Take 1,500 mg by mouth at bedtime. Dr decreasing medication until discontinued    . doxycycline (ORACEA) 40 MG capsule TAKE 1 CAPSULE BY MOUTH DAILY. 90 capsule PRN  . esomeprazole (NEXIUM) 40 MG capsule TAKE 1 CAPSULE BY MOUTH AT BEDTIME. 90 capsule 3  . estradiol (ESTRACE) 0.1 MG/GM vaginal cream Use 1/2 g vaginally three times a week (Patient not taking: Reported on 02/09/2015) 42.5 g 3  . estradiol (ESTRACE) 1 MG tablet Take 1 tablet (1 mg total) by mouth daily. 90 tablet 4  . fluticasone (FLONASE) 50 MCG/ACT nasal spray Place 2 sprays into the nose daily as needed. For allergies    . lidocaine (XYLOCAINE) 2 % jelly Apply topically as needed (for dysparunia). 30 mL 12  .  medroxyPROGESTERone (PROVERA) 5 MG tablet Take 1 tablet (5 mg total) by mouth daily. 90 tablet 3  . nitrofurantoin, macrocrystal-monohydrate, (MACROBID) 100 MG capsule Take 1 capsule (100 mg total) by mouth once as needed (post coital). 30 capsule 6  . NONFORMULARY OR COMPOUNDED ITEM Testosterone propionate 2% in white petrolatum, apply bid for 6 weeks and then daily as directed.  60 grams. (Patient not taking: Reported on 02/09/2015) 60 each 1  . Probiotic Product (PROBIOTIC DAILY PO) Take 1 tablet by mouth daily at 6 (six) AM.    . propranolol (INDERAL) 10 MG tablet Take 20 mg by mouth daily  as needed (shakiness). Using for the shakiness.  Uses prn  1     Objective:  Objective Physical Exam  Constitutional: She is oriented to person, place, and time. She appears well-developed and well-nourished.  HENT:  Head: Normocephalic and atraumatic.  Eyes: Conjunctivae and EOM are normal.  Neck: Normal range of motion. Neck supple. No JVD present. Carotid bruit is not present. No thyromegaly present.  Cardiovascular: Normal rate, regular rhythm and normal heart sounds.   No murmur heard. Pulmonary/Chest: Effort normal and breath sounds normal. No respiratory distress. She has no wheezes. She has no rales. She exhibits no tenderness.  Musculoskeletal: She exhibits no edema.  Neurological: She is alert and oriented to person, place, and time.  Psychiatric: She has a normal mood and affect. Her behavior is normal.  Nursing note and vitals reviewed.  BP 124/82 mmHg  Pulse 94  Temp(Src) 98 F (36.7 C) (Oral)  Ht 5\' 8"  (1.727 m)  Wt 170 lb 9.6 oz (77.384 kg)  BMI 25.95 kg/m2  SpO2 99%  LMP 05/11/2012 Wt Readings from Last 3 Encounters:  02/09/15 170 lb 3.2 oz (77.202 kg)  02/08/15 170 lb 9.6 oz (77.384 kg)  01/19/15 174 lb (78.926 kg)     Lab Results  Component Value Date   WBC 7.8 02/09/2015   HGB 13.3 02/09/2015   HCT 41.1 02/09/2015   PLT 296 02/09/2015   GLUCOSE 91 02/09/2015   CHOL 254* 10/31/2014   TRIG 234.0* 10/31/2014   HDL 42.30 10/31/2014   LDLDIRECT 212.0 10/31/2014   LDLCALC 158* 11/02/2013   ALT 13 10/31/2014   AST 17 10/31/2014   NA 141 02/09/2015   K 3.6 02/09/2015   CL 105 02/09/2015   CREATININE 0.89 02/09/2015   BUN 9 02/09/2015   CO2 22 02/09/2015   TSH 2.37 12/15/2014   INR 1.10 02/09/2015   HGBA1C 5.3 03/20/2009   MICROALBUR 0.6 05/19/2011  ekg-- no acute changes  No results found.   Assessment & Plan:  Plan I have discontinued Ms. Salomone's Cariprazine HCl, metroNIDAZOLE, fluconazole, and metroNIDAZOLE. I am also having her maintain  her divalproex, fluticasone, Vitamin D3, buPROPion, NONFORMULARY OR COMPOUNDED ITEM, Probiotic Product (PROBIOTIC DAILY PO), nitrofurantoin (macrocrystal-monohydrate), medroxyPROGESTERone, lidocaine, estradiol, estradiol, doxycycline, esomeprazole, propranolol, FINACEA, and diltiazem.  Meds ordered this encounter  Medications  . FINACEA 15 % FOAM    Sig: Apply 1 application topically daily as needed (skin).    Dispense:  50 g    Refill:  3  . diltiazem (CARTIA XT) 180 MG 24 hr capsule    Sig: TAKE 1 CAPSULE (180 MG TOTAL) BY MOUTH DAILY.    Dispense:  90 capsule    Refill:  1    Problem List Items Addressed This Visit    None    Visit Diagnoses    Chest pain, unspecified chest pain type    -  Primary    Relevant Orders    EKG 12-Lead (Completed)    Troponin I (Completed)    D-Dimer, Quantitative (Completed)    DG Chest 2 View (Completed)    Palpitations        Relevant Orders    EKG 12-Lead (Completed)    Troponin I (Completed)    D-Dimer, Quantitative (Completed)    DG Chest 2 View (Completed)    Acne vulgaris        Relevant Medications    FINACEA 15 % FOAM    Essential hypertension        Relevant Medications    diltiazem (CARTIA XT) 180 MG 24 hr capsule     pt instructed to go to ER if cp returns  Follow-up: Return if symptoms worsen or fail to improve.  Garnet Koyanagi, DO

## 2015-02-08 NOTE — Telephone Encounter (Signed)
Did not see that---- thankyou---

## 2015-02-09 ENCOUNTER — Ambulatory Visit (HOSPITAL_COMMUNITY): Payer: 59

## 2015-02-09 ENCOUNTER — Inpatient Hospital Stay (HOSPITAL_COMMUNITY)
Admission: EM | Admit: 2015-02-09 | Discharge: 2015-02-10 | DRG: 176 | Disposition: A | Discharge status: Home / Self Care | Payer: 59 | Attending: Internal Medicine | Admitting: Internal Medicine

## 2015-02-09 ENCOUNTER — Other Ambulatory Visit (INDEPENDENT_AMBULATORY_CARE_PROVIDER_SITE_OTHER): Payer: 59

## 2015-02-09 ENCOUNTER — Encounter (HOSPITAL_BASED_OUTPATIENT_CLINIC_OR_DEPARTMENT_OTHER): Payer: Self-pay

## 2015-02-09 ENCOUNTER — Other Ambulatory Visit: Payer: Self-pay

## 2015-02-09 ENCOUNTER — Ambulatory Visit (HOSPITAL_BASED_OUTPATIENT_CLINIC_OR_DEPARTMENT_OTHER)
Admission: RE | Admit: 2015-02-09 | Discharge: 2015-02-09 | Disposition: A | Discharge status: Home / Self Care | Payer: 59 | Source: Ambulatory Visit | Attending: Family Medicine | Admitting: Family Medicine

## 2015-02-09 ENCOUNTER — Ambulatory Visit: Payer: Self-pay | Admitting: Family Medicine

## 2015-02-09 ENCOUNTER — Encounter (HOSPITAL_COMMUNITY): Payer: Self-pay

## 2015-02-09 DIAGNOSIS — R079 Chest pain, unspecified: Secondary | ICD-10-CM | POA: Insufficient documentation

## 2015-02-09 DIAGNOSIS — R7989 Other specified abnormal findings of blood chemistry: Secondary | ICD-10-CM

## 2015-02-09 DIAGNOSIS — J90 Pleural effusion, not elsewhere classified: Secondary | ICD-10-CM

## 2015-02-09 DIAGNOSIS — Z79890 Hormone replacement therapy (postmenopausal): Secondary | ICD-10-CM

## 2015-02-09 DIAGNOSIS — I341 Nonrheumatic mitral (valve) prolapse: Secondary | ICD-10-CM | POA: Diagnosis present

## 2015-02-09 DIAGNOSIS — I10 Essential (primary) hypertension: Secondary | ICD-10-CM | POA: Diagnosis present

## 2015-02-09 DIAGNOSIS — R071 Chest pain on breathing: Secondary | ICD-10-CM

## 2015-02-09 DIAGNOSIS — Z66 Do not resuscitate: Secondary | ICD-10-CM | POA: Diagnosis present

## 2015-02-09 DIAGNOSIS — I2782 Chronic pulmonary embolism: Secondary | ICD-10-CM | POA: Diagnosis present

## 2015-02-09 DIAGNOSIS — I2602 Saddle embolus of pulmonary artery with acute cor pulmonale: Secondary | ICD-10-CM | POA: Diagnosis present

## 2015-02-09 DIAGNOSIS — K219 Gastro-esophageal reflux disease without esophagitis: Secondary | ICD-10-CM | POA: Diagnosis present

## 2015-02-09 DIAGNOSIS — K589 Irritable bowel syndrome without diarrhea: Secondary | ICD-10-CM | POA: Diagnosis present

## 2015-02-09 DIAGNOSIS — E785 Hyperlipidemia, unspecified: Secondary | ICD-10-CM | POA: Diagnosis present

## 2015-02-09 DIAGNOSIS — I2699 Other pulmonary embolism without acute cor pulmonale: Principal | ICD-10-CM

## 2015-02-09 DIAGNOSIS — F319 Bipolar disorder, unspecified: Secondary | ICD-10-CM | POA: Diagnosis present

## 2015-02-09 DIAGNOSIS — F419 Anxiety disorder, unspecified: Secondary | ICD-10-CM | POA: Diagnosis present

## 2015-02-09 DIAGNOSIS — Z79899 Other long term (current) drug therapy: Secondary | ICD-10-CM

## 2015-02-09 DIAGNOSIS — Z87891 Personal history of nicotine dependence: Secondary | ICD-10-CM

## 2015-02-09 HISTORY — DX: Other pulmonary embolism without acute cor pulmonale: I26.99

## 2015-02-09 LAB — CBC WITH DIFFERENTIAL/PLATELET
Basophils Absolute: 0 10*3/uL (ref 0.0–0.1)
Basophils Absolute: 0 10*3/uL (ref 0.0–0.1)
Basophils Relative: 0.4 % (ref 0.0–3.0)
Basophils Relative: 0 %
Eosinophils Absolute: 0 10*3/uL (ref 0.0–0.7)
Eosinophils Absolute: 0.1 10*3/uL (ref 0.0–0.7)
Eosinophils Relative: 0.6 % (ref 0.0–5.0)
Eosinophils Relative: 1 %
HCT: 42.8 % (ref 36.0–46.0)
HCT: 41.1 % (ref 36.0–46.0)
Hemoglobin: 14 g/dL (ref 12.0–15.0)
Hemoglobin: 13.3 g/dL (ref 12.0–15.0)
Lymphocytes Relative: 26.2 % (ref 12.0–46.0)
Lymphocytes Relative: 27 %
Lymphs Abs: 1.9 10*3/uL (ref 0.7–4.0)
Lymphs Abs: 2.1 10*3/uL (ref 0.7–4.0)
MCH: 29.5 pg (ref 26.0–34.0)
MCHC: 32.7 g/dL (ref 30.0–36.0)
MCHC: 32.4 g/dL (ref 30.0–36.0)
MCV: 90 fl (ref 78.0–100.0)
MCV: 91.1 fL (ref 78.0–100.0)
Monocytes Absolute: 0.4 10*3/uL (ref 0.1–1.0)
Monocytes Absolute: 0.6 10*3/uL (ref 0.1–1.0)
Monocytes Relative: 6.3 % (ref 3.0–12.0)
Monocytes Relative: 7 %
Neutro Abs: 4.7 10*3/uL (ref 1.4–7.7)
Neutro Abs: 5.1 10*3/uL (ref 1.7–7.7)
Neutrophils Relative %: 66.5 % (ref 43.0–77.0)
Neutrophils Relative %: 65 %
Platelets: 333 10*3/uL (ref 150.0–400.0)
Platelets: 296 10*3/uL (ref 150–400)
RBC: 4.76 Mil/uL (ref 3.87–5.11)
RBC: 4.51 MIL/uL (ref 3.87–5.11)
RDW: 13.7 % (ref 11.5–15.5)
RDW: 13.5 % (ref 11.5–15.5)
WBC: 7.1 10*3/uL (ref 4.0–10.5)
WBC: 7.8 10*3/uL (ref 4.0–10.5)

## 2015-02-09 LAB — BASIC METABOLIC PANEL
Anion gap: 14 (ref 5–15)
BUN: 9 mg/dL (ref 6–20)
CO2: 22 mmol/L (ref 22–32)
Calcium: 9.6 mg/dL (ref 8.9–10.3)
Chloride: 105 mmol/L (ref 101–111)
Creatinine, Ser: 0.89 mg/dL (ref 0.44–1.00)
GFR calc Af Amer: >60 mL/min (ref >60)
GFR calc non Af Amer: >60 mL/min (ref >60)
Glucose, Bld: 91 mg/dL (ref 65–99)
Potassium: 3.6 mmol/L (ref 3.5–5.1)
Sodium: 141 mmol/L (ref 135–145)

## 2015-02-09 LAB — COMPREHENSIVE METABOLIC PANEL
ALT: 8 U/L (ref 0–35)
AST: 12 U/L (ref 0–37)
Albumin: 4.3 g/dL (ref 3.5–5.2)
Alkaline Phosphatase: 50 U/L (ref 39–117)
BUN: 11 mg/dL (ref 6–23)
CO2: 26 mEq/L (ref 19–32)
Calcium: 9.3 mg/dL (ref 8.4–10.5)
Chloride: 103 mEq/L (ref 96–112)
Creatinine, Ser: 0.94 mg/dL (ref 0.40–1.20)
GFR: 65.66 mL/min (ref >60.00)
Glucose, Bld: 83 mg/dL (ref 70–99)
Potassium: 3.6 mEq/L (ref 3.5–5.1)
Sodium: 139 mEq/L (ref 135–145)
Total Bilirubin: 0.4 mg/dL (ref 0.2–1.2)
Total Protein: 7.5 g/dL (ref 6.0–8.3)

## 2015-02-09 LAB — BRAIN NATRIURETIC PEPTIDE: B Natriuretic Peptide: 11.4 pg/mL (ref 0.0–100.0)

## 2015-02-09 LAB — APTT: aPTT: 26 seconds (ref 24–37)

## 2015-02-09 LAB — HEPARIN LEVEL (UNFRACTIONATED): Heparin Unfractionated: 0.73 IU/mL — ABNORMAL HIGH (ref 0.30–0.70)

## 2015-02-09 LAB — I-STAT TROPONIN, ED: Troponin i, poc: 0.01 ng/mL (ref 0.00–0.08)

## 2015-02-09 LAB — PROTIME-INR
INR: 1.1 (ref 0.00–1.49)
Prothrombin Time: 14.4 seconds (ref 11.6–15.2)

## 2015-02-09 LAB — SEDIMENTATION RATE: Sed Rate: 26 mm/hr — ABNORMAL HIGH (ref 0–22)

## 2015-02-09 MED ORDER — ACETAMINOPHEN 650 MG RE SUPP: 650.0000 mg | Freq: Four times a day (QID) | RECTAL | Status: DC | PRN

## 2015-02-09 MED ORDER — MORPHINE SULFATE (PF) 2 MG/ML IV SOLN: 1.0000 mg | INTRAVENOUS | Status: DC | PRN

## 2015-02-09 MED ORDER — ONDANSETRON HCL 4 MG PO TABS: 4.0000 mg | ORAL_TABLET | Freq: Four times a day (QID) | ORAL | Status: DC | PRN

## 2015-02-09 MED ORDER — DIVALPROEX SODIUM ER 500 MG PO TB24
1500.0000 mg | ORAL_TABLET | Freq: Every day | ORAL | Status: DC
  Administered 2015-02-09: 1500 mg via ORAL
  Filled 2015-02-09 (×2): qty 3

## 2015-02-09 MED ORDER — IOHEXOL 350 MG/ML SOLN
100.0000 mL | Freq: Once | INTRAVENOUS | Status: AC | PRN
  Administered 2015-02-09: 100 mL via INTRAVENOUS

## 2015-02-09 MED ORDER — DILTIAZEM HCL ER COATED BEADS 180 MG PO CP24
180.0000 mg | ORAL_CAPSULE | Freq: Every day | ORAL | Status: DC
  Administered 2015-02-09 – 2015-02-10 (×2): 180 mg via ORAL
  Filled 2015-02-09 (×2): qty 1

## 2015-02-09 MED ORDER — ONDANSETRON HCL 4 MG/2ML IJ SOLN: 4.0000 mg | Freq: Four times a day (QID) | INTRAMUSCULAR | Status: DC | PRN

## 2015-02-09 MED ORDER — SODIUM CHLORIDE 0.9 % IV SOLN
INTRAVENOUS | Status: DC
  Administered 2015-02-09 – 2015-02-10 (×2): via INTRAVENOUS

## 2015-02-09 MED ORDER — HEPARIN BOLUS VIA INFUSION
4000.0000 [IU] | Freq: Once | INTRAVENOUS | Status: AC
  Administered 2015-02-09: 4000 [IU] via INTRAVENOUS
  Filled 2015-02-09: qty 4000

## 2015-02-09 MED ORDER — BUPROPION HCL ER (XL) 300 MG PO TB24
300.0000 mg | ORAL_TABLET | Freq: Every day | ORAL | Status: DC
  Administered 2015-02-09: 300 mg via ORAL
  Filled 2015-02-09 (×2): qty 1

## 2015-02-09 MED ORDER — HEPARIN (PORCINE) IN NACL 100-0.45 UNIT/ML-% IJ SOLN
1250.0000 [IU]/h | INTRAMUSCULAR | Status: DC
  Administered 2015-02-09 – 2015-02-10 (×2): 1250 [IU]/h via INTRAVENOUS
  Filled 2015-02-09 (×2): qty 250

## 2015-02-09 MED ORDER — ACETAMINOPHEN 325 MG PO TABS: 650.0000 mg | ORAL_TABLET | Freq: Four times a day (QID) | ORAL | Status: DC | PRN

## 2015-02-09 MED ORDER — PANTOPRAZOLE SODIUM 40 MG PO TBEC
40.0000 mg | DELAYED_RELEASE_TABLET | Freq: Every day | ORAL | Status: DC
  Administered 2015-02-10: 40 mg via ORAL
  Filled 2015-02-09: qty 1

## 2015-02-09 MED ORDER — ALPRAZOLAM 0.25 MG PO TABS
0.2500 mg | ORAL_TABLET | Freq: Every evening | ORAL | Status: DC | PRN
  Administered 2015-02-10: 0.25 mg via ORAL
  Filled 2015-02-09: qty 1

## 2015-02-09 NOTE — ED Provider Notes (Signed)
CSN: [id number]     Arrival date & time 02/09/15  1026 History   First MD Initiated Contact with Patient 02/09/15 1028     Chief Complaint  Patient presents with  . Shortness of Breath    HPI   Beverly Cole is a 54 y.o. female with a PMH of HLD, HTN, GERD, depression, anxiety who presents to the ED with PE confirmed by her PCP. She was evaluated in clinic yesterday for chest pain and shortness of breath x 1 week. She states her symptoms are only present at night when she lies down. She denies alleviating factors. She underwent CT angio today, which was positive for PE. Currently, she denies chest pain or shortness of breath. She denies recent travel or immobility, recent surgery, history of DVT or PE, history of malignancy. She states she takes estrogen.   Past Medical History  Diagnosis Date  . DEPRESSION   . GERD   . HYPERLIPIDEMIA   . MYALGIA   . BIPOLAR DISORDER UNSPECIFIED   . IRRITABLE BOWEL SYNDROME, HX OF   . Hypertension   . Atrial tachycardia ([place])   . Hypertension   . Bursitis of hip 09/1999    Bilateral - Dr. Berenice Primas  . Shortness of breath     Pulmonary eval 05/2002  . Mitral valve prolapse 12/17/2000    cardiolyte study 12/2000 neg  . Fistula of female genitalia 03/29/2007    enterovesical fistula, not reapired  . Anxiety   . Menopausal state 01/2012    FSH = 88.5  . Thyroid nodule 2014  . Tremors of nervous system   . Headache(784.0)     was with menstrual cycle. no longer a problem   Past Surgical History  Procedure Laterality Date  . Lipoma (r) side  1990    Right back   Family History  Problem Relation Age of Onset  . Lung cancer Mother   . Hypertension Mother   . Hyperlipidemia Mother   . Cancer Father     Esophageal cancer  . Hyperlipidemia Father   . Depression Father   . Colon cancer Paternal Uncle   . Sarcoidosis Other   . Testicular cancer Other   . Cancer Brother    Social History  Substance Use Topics  . Smoking status: Former Smoker  -- 1.00 packs/day for 25 years    Types: Cigarettes    Quit date: 02/11/1996  . Smokeless tobacco: Never Used     Comment: Married, lives with spouse. 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 Alcohol Use: No   OB History    Gravida Para Term Preterm AB TAB SAB Ectopic Multiple Living   0 0              Review of Systems  Constitutional: Negative for fever and chills.  Respiratory: Positive for shortness of breath.   Cardiovascular: Positive for chest pain and palpitations. Negative for leg swelling.  Gastrointestinal: Negative for nausea, vomiting and abdominal pain.  Neurological: Negative for dizziness, light-headedness and headaches.  All other systems reviewed and are negative.     Allergies  Cariprazine; Lopressor; and Nickel  Home Medications   Prior to Admission medications   Medication Sig Start Date End Date Taking? Authorizing Provider  ALPRAZolam Duanne Moron) 0.5 MG tablet Take 0.25-0.5 mg by mouth at bedtime as needed for sleep.   Yes Historical Provider, MD  buPROPion (WELLBUTRIN XL) 150 MG 24 hr tablet Take 300 mg by mouth at bedtime.  Yes Historical Provider, MD  Cholecalciferol (VITAMIN D3) 1000 UNITS CAPS Take 1,000 Units by mouth at bedtime.    Yes Historical Provider, MD  diltiazem (CARTIA XT) 180 MG 24 hr capsule TAKE 1 CAPSULE (180 MG TOTAL) BY MOUTH DAILY. 02/08/15  Yes Jaeleen R Lowne, DO  divalproex (DEPAKOTE) 500 MG 24 hr tablet Take 1,500 mg by mouth at bedtime. Dr decreasing medication until discontinued   Yes Historical Provider, MD  doxycycline (ORACEA) 40 MG capsule TAKE 1 CAPSULE BY MOUTH DAILY. 07/21/14  Yes Alferd Apa Lowne, DO  esomeprazole (NEXIUM) 40 MG capsule TAKE 1 CAPSULE BY MOUTH AT BEDTIME. 11/27/14  Yes Alferd Apa Lowne, DO  estradiol (ESTRACE) 1 MG tablet Take 1 tablet (1 mg total) by mouth daily. 06/01/14  Yes Kem Boroughs, FNP  FINACEA 15 % FOAM Apply 1 application topically daily as needed (skin). 02/08/15  Yes Anjolie R Lowne, DO   fluticasone (FLONASE) 50 MCG/ACT nasal spray Place 2 sprays into the nose daily as needed. For allergies   Yes Historical Provider, MD  lidocaine (XYLOCAINE) 2 % jelly Apply topically as needed (for dysparunia). 06/01/14  Yes Kem Boroughs, FNP  medroxyPROGESTERone (PROVERA) 5 MG tablet Take 1 tablet (5 mg total) by mouth daily. 06/01/14  Yes Kem Boroughs, FNP  nitrofurantoin, macrocrystal-monohydrate, (MACROBID) 100 MG capsule Take 1 capsule (100 mg total) by mouth once as needed (post coital). 06/01/14  Yes Kem Boroughs, FNP  Probiotic Product (PROBIOTIC DAILY PO) Take 1 tablet by mouth daily at 6 (six) AM.   Yes Historical Provider, MD  propranolol (INDERAL) 10 MG tablet Take 20 mg by mouth daily as needed (shakiness). Using for the shakiness.  Uses prn 11/21/14  Yes Historical Provider, MD  estradiol (ESTRACE) 0.1 MG/GM vaginal cream Use 1/2 g vaginally three times a week Patient not taking: Reported on 02/09/2015 06/01/14   Kem Boroughs, FNP  NONFORMULARY OR COMPOUNDED ITEM Testosterone propionate 2% in white petrolatum, apply bid for 6 weeks and then daily as directed.  60 grams. Patient not taking: Reported on 02/09/2015 09/01/13   Nunzio Cobbs, MD    BP 136/67 mmHg  Pulse 88  Temp(Src) 98.3 F (36.8 C)  Resp 13  Wt 77.202 kg  SpO2 100%  LMP 05/11/2012 Physical Exam  Constitutional: She is oriented to person, place, and time. She appears well-developed and well-nourished. No distress.  HENT:  Head: Normocephalic and atraumatic.  Right Ear: External ear normal.  Left Ear: External ear normal.  Nose: Nose normal.  Mouth/Throat: Uvula is midline, oropharynx is clear and moist and mucous membranes are normal.  Eyes: Conjunctivae, EOM and lids are normal. Pupils are equal, round, and reactive to light. Right eye exhibits no discharge. Left eye exhibits no discharge. No scleral icterus.  Neck: Normal range of motion. Neck supple.  Cardiovascular: Normal rate, regular  rhythm, normal heart sounds, intact distal pulses and normal pulses.   Pulmonary/Chest: Effort normal and breath sounds normal. No respiratory distress. She has no wheezes. She has no rales.  Abdominal: Soft. Normal appearance and bowel sounds are normal. She exhibits no distension and no mass. There is no tenderness. There is no rigidity, no rebound and no guarding.  Musculoskeletal: Normal range of motion. She exhibits no edema or tenderness.  Neurological: She is alert and oriented to person, place, and time.  Skin: Skin is warm, dry and intact. No rash noted. She is not diaphoretic. No erythema. No pallor.  Psychiatric: She has a normal mood  and affect. Her speech is normal and behavior is normal.  Nursing note and vitals reviewed.   ED Course  Procedures (including critical care time)  Labs Review Labs Reviewed  CBC WITH DIFFERENTIAL/PLATELET  BASIC METABOLIC PANEL  BRAIN NATRIURETIC PEPTIDE  PROTIME-INR  APTT  HEPARIN LEVEL (UNFRACTIONATED)  I-STAT TROPOININ, ED    Imaging Review Dg Chest 2 View  02/08/2015  CLINICAL DATA:  Chest pain, shortness of breath for 1 month. EXAM: CHEST  2 VIEW COMPARISON:  01/01/2015 FINDINGS: Blunting of the left costophrenic angle likely reflects small left effusion. Linear densities at the left base, likely scarring. Right lung is clear. Heart is normal size. Mediastinal contours are within normal limits. No acute bony abnormality. IMPRESSION: Small left pleural effusion. Left basilar linear densities, likely scarring. Electronically Signed   By: Rolm Baptise M.D.   On: 02/08/2015 15:47   Ct Angio Chest W/cm &/or Wo Cm  02/09/2015  ADDENDUM REPORT: 02/09/2015 10:09 ADDENDUM: Critical Value/emergent results were called by telephone at the time of interpretation on 02/09/2015 at 1006 hours to Dr. Garnet Koyanagi , who verbally acknowledged these results. Electronically Signed   By: Genevie Ann M.D.   On: 02/09/2015 10:09  02/09/2015  CLINICAL DATA:   54 year old female with increasing chest pain, shortness of breath, palpitations for several weeks. Initial encounter. EXAM: CT ANGIOGRAPHY CHEST WITH CONTRAST TECHNIQUE: Multidetector CT imaging of the chest was performed using the standard protocol during bolus administration of intravenous contrast. Multiplanar CT image reconstructions and MIPs were obtained to evaluate the vascular anatomy. CONTRAST:  164mL OMNIPAQUE IOHEXOL 350 MG/ML SOLN COMPARISON:  Chest radiographs 02/08/2015 and earlier. Cardiac CTA 08/12/2011. FINDINGS: Good contrast bolus timing in the pulmonary arterial tree. Positive for bilateral lower lobe pulmonary artery filling defects (posterior basal segmental branches on the left series 6 image 332, and on the right image 331). Some of these pulmonary emboli are nonocclusive. Left upper lobe clot also on series 6, image 247. No central or saddle embolus. RV LV ratio does not appear pathologic (series 6, image 379). There is an associated small layering left pleural effusion. No pericardial or right pleural effusion. Visualized aorta is negative. No mediastinal or hilar lymphadenopathy. Major airways are patent. Dependent atelectasis greater on the left. No consolidation or pulmonary infarcts identified. Negative visualized liver, spleen, adrenal glands, left kidney, and stomach in the upper abdomen. No axillary lymphadenopathy. Negative thoracic inlet. No acute osseous abnormality identified. Review of the MIP images confirms the above findings. IMPRESSION: 1. Positive for bilateral segmental pulmonary emboli. 2. No central or saddle embolus, and physiologic appearing RV/LV ratio. 3. Small layering left pleural effusion with mild pulmonary atelectasis. Electronically Signed: By: Genevie Ann M.D. On: 02/09/2015 10:04   I have personally reviewed and evaluated these images and lab results as part of my medical decision-making.   EKG Interpretation None      CRITICAL CARE Performed by:  Marella Chimes   Total critical care time: 45 minutes  Critical care time was exclusive of separately billable procedures and treating other patients.  Critical care was necessary to treat or prevent imminent or life-threatening deterioration.  Critical care was time spent personally by me on the following activities: development of treatment plan with patient and/or surrogate as well as nursing, discussions with consultants, evaluation of patient's response to treatment, examination of patient, obtaining history from patient or surrogate, ordering and performing treatments and interventions, ordering and review of laboratory studies, ordering and review of radiographic studies,  pulse oximetry and re-evaluation of patient's condition.   MDM   Final diagnoses:  Other acute pulmonary embolism without acute cor pulmonale ([place])    54 year old female presents with PE, confirmed by her primary care physician today. She underwent CT angio this morning prior to arrival, which demonstrated bilateral segmental pulmonary emboli, no central or saddle embolus, physiologic-appearing RV/LV ratio. She was subsequently sent to the ED for further evaluation. Currently, she denies chest pain or shortness of breath.  Patient is afebrile. Vital signs stable. Heart regular rate and rhythm. Lungs clear bilaterally. Abdomen soft, nontender, nondistended. No lower extremity edema.   Will obtain basic labs, coags, troponin, EKG, BNP. Patient started on heparin per pharmacy.  CBC negative for leukocytosis or anemia. BMP unremarkable. Coags within normal limits. BNP within normal limits. Troponin negative.  Patient discussed with and seen by Dr. Rex Kras. Hospitalist consulted for admission. Spoke with Dr. Darrick Meigs, who will admit the patient for further evaluation and management.  BP 136/67 mmHg  Pulse 88  Temp(Src) 98.3 F (36.8 C)  Resp 13  Wt 77.202 kg  SpO2 100%  LMP 05/11/2012    Marella Chimes, PA-C 02/09/15 [place], MD 02/10/15 [phone]

## 2015-02-09 NOTE — H&P (Signed)
PCP:   Garnet Koyanagi, DO   Chief Complaint:  Shortness of breath  HPI:  54 year old female who  has a past medical history of DEPRESSION; GERD; HYPERLIPIDEMIA; MYALGIA; BIPOLAR DISORDER UNSPECIFIED; IRRITABLE BOWEL SYNDROME, HX OF; Hypertension; Atrial tachycardia ([place]); Hypertension; Bursitis of hip (09/1999); Shortness of breath; Mitral valve prolapse (12/17/2000); Fistula of female genitalia (03/29/2007); Anxiety; Menopausal state (01/2012); Thyroid nodule (2014); Tremors of nervous system; and Headache(784.0). Today patient was sent to the ED for pulmonary embolism confirmed by the PCP. Patient was seen at the PCP office yesterday for chest pain and complained of shortness of breath while lying on left side. Today she underwent CT injury gram which was positive for pulmonary embolism. Patient takes estrogen and progesterone hormone therapy. She denies any recent long-distance travel, no surgery. She had  ECT 2 for depression. Denies chest pain at this time. No nausea vomiting or diarrhea. She did not pass out. Complains of intermittent dizziness. Patient was started on heparin protocol in the ED.  Allergies:   Allergies  Allergen Reactions  . Cariprazine Other (See Comments)    Patient becomes suicidal when taking this medication.  Also causes tremors. Patient feels that the reactions were due to the dosing being too high.  She may restart taking in the future  . Lopressor [Metoprolol]     Hallucinations hair falls out  . Nickel Rash      Past Medical History  Diagnosis Date  . DEPRESSION   . GERD   . HYPERLIPIDEMIA   . MYALGIA   . BIPOLAR DISORDER UNSPECIFIED   . IRRITABLE BOWEL SYNDROME, HX OF   . Hypertension   . Atrial tachycardia ([place])   . Hypertension   . Bursitis of hip 09/1999    Bilateral - Dr. Berenice Primas  . Shortness of breath     Pulmonary eval 05/2002  . Mitral valve prolapse 12/17/2000    cardiolyte study 12/2000 neg  . Fistula of female genitalia 03/29/2007   enterovesical fistula, not reapired  . Anxiety   . Menopausal state 01/2012    FSH = 88.5  . Thyroid nodule 2014  . Tremors of nervous system   . Headache(784.0)     was with menstrual cycle. no longer a problem    Past Surgical History  Procedure Laterality Date  . Lipoma (r) side  1990    Right back    Prior to Admission medications   Medication Sig Start Date End Date Taking? Authorizing Provider  ALPRAZolam Duanne Moron) 0.5 MG tablet Take 0.25-0.5 mg by mouth at bedtime as needed for sleep.   Yes Historical Provider, MD  buPROPion (WELLBUTRIN XL) 150 MG 24 hr tablet Take 300 mg by mouth at bedtime.    Yes Historical Provider, MD  Cholecalciferol (VITAMIN D3) 1000 UNITS CAPS Take 1,000 Units by mouth at bedtime.    Yes Historical Provider, MD  diltiazem (CARTIA XT) 180 MG 24 hr capsule TAKE 1 CAPSULE (180 MG TOTAL) BY MOUTH DAILY. 02/08/15  Yes Narda R Lowne, DO  divalproex (DEPAKOTE) 500 MG 24 hr tablet Take 1,500 mg by mouth at bedtime. Dr decreasing medication until discontinued   Yes Historical Provider, MD  doxycycline (ORACEA) 40 MG capsule TAKE 1 CAPSULE BY MOUTH DAILY. 07/21/14  Yes Alferd Apa Lowne, DO  esomeprazole (NEXIUM) 40 MG capsule TAKE 1 CAPSULE BY MOUTH AT BEDTIME. 11/27/14  Yes Alferd Apa Lowne, DO  estradiol (ESTRACE) 1 MG tablet Take 1 tablet (1 mg total) by mouth daily. 06/01/14  Yes Kem Boroughs, FNP  FINACEA 15 % FOAM Apply 1 application topically daily as needed (skin). 02/08/15  Yes Chamia R Lowne, DO  fluticasone (FLONASE) 50 MCG/ACT nasal spray Place 2 sprays into the nose daily as needed. For allergies   Yes Historical Provider, MD  lidocaine (XYLOCAINE) 2 % jelly Apply topically as needed (for dysparunia). 06/01/14  Yes Kem Boroughs, FNP  medroxyPROGESTERone (PROVERA) 5 MG tablet Take 1 tablet (5 mg total) by mouth daily. 06/01/14  Yes Kem Boroughs, FNP  nitrofurantoin, macrocrystal-monohydrate, (MACROBID) 100 MG capsule Take 1 capsule (100 mg total) by mouth  once as needed (post coital). 06/01/14  Yes Kem Boroughs, FNP  Probiotic Product (PROBIOTIC DAILY PO) Take 1 tablet by mouth daily at 6 (six) AM.   Yes Historical Provider, MD  propranolol (INDERAL) 10 MG tablet Take 20 mg by mouth daily as needed (shakiness). Using for the shakiness.  Uses prn 11/21/14  Yes Historical Provider, MD  estradiol (ESTRACE) 0.1 MG/GM vaginal cream Use 1/2 g vaginally three times a week Patient not taking: Reported on 02/09/2015 06/01/14   Kem Boroughs, FNP  NONFORMULARY OR COMPOUNDED ITEM Testosterone propionate 2% in white petrolatum, apply bid for 6 weeks and then daily as directed.  60 grams. Patient not taking: Reported on 02/09/2015 09/01/13   Nunzio Cobbs, MD    Social History:  reports that she quit smoking about 19 years ago. Her smoking use included Cigarettes. She has a 25 pack-year smoking history. She has never used smokeless tobacco. She reports that she does not drink alcohol or use illicit drugs.  Family History  Problem Relation Age of Onset  . Lung cancer Mother   . Hypertension Mother   . Hyperlipidemia Mother   . Cancer Father     Esophageal cancer  . Hyperlipidemia Father   . Depression Father   . Colon cancer Paternal Uncle   . Sarcoidosis Other   . Testicular cancer Other   . Cancer Brother     Danley Danker Weights   02/09/15 1042  Weight: 77.202 kg (170 lb 3.2 oz)    All the positives are listed in BOLD  Review of Systems:  HEENT: Headache, blurred vision, runny nose, sore throat Neck: Hypothyroidism, hyperthyroidism,,lymphadenopathy Chest : Shortness of breath, history of COPD, Asthma Heart : Chest pain, history of coronary arterey disease GI:  Nausea, vomiting, diarrhea, constipation, GERD GU: Dysuria, urgency, frequency of urination, hematuria Neuro: Stroke, seizures, syncope Psych: Depression, anxiety, hallucinations   Physical Exam: Blood pressure 110/52, pulse 85, temperature 98.3 F (36.8 C), resp. rate  19, weight 77.202 kg (170 lb 3.2 oz), last menstrual period 05/11/2012, SpO2 99 %. Constitutional:   Patient is a well-developed and well-nourished female* in no acute distress and cooperative with exam. Head: Normocephalic and atraumatic Mouth: Mucus membranes moist Eyes: PERRL, EOMI, conjunctivae normal Neck: Supple, No Thyromegaly Cardiovascular: RRR, S1 normal, S2 normal Pulmonary/Chest: CTAB, no wheezes, rales, or rhonchi Abdominal: Soft. Non-tender, non-distended, bowel sounds are normal, no masses, organomegaly, or guarding present.  Neurological: A&O x3, Strength is normal and symmetric bilaterally, cranial nerve II-XII are grossly intact, no focal motor deficit, sensory intact to light touch bilaterally.  Extremities : No Cyanosis, Clubbing or Edema  Labs on Admission:  Basic Metabolic Panel:  Recent Labs Lab 02/09/15 1055  NA 141  K 3.6  CL 105  CO2 22  GLUCOSE 91  BUN 9  CREATININE 0.89  CALCIUM 9.6   CBC:  Recent Labs Lab 02/09/15  1055  WBC 7.8  NEUTROABS 5.1  HGB 13.3  HCT 41.1  MCV 91.1  PLT 296    BNP (last 3 results)  Recent Labs  02/09/15 1055  BNP 11.4    ProBNP (last 3 results) No results for input(s): PROBNP in the last 8760 hours.  CBG: No results for input(s): GLUCAP in the last 168 hours.  Radiological Exams on Admission: Dg Chest 2 View  02/08/2015  CLINICAL DATA:  Chest pain, shortness of breath for 1 month. EXAM: CHEST  2 VIEW COMPARISON:  01/01/2015 FINDINGS: Blunting of the left costophrenic angle likely reflects small left effusion. Linear densities at the left base, likely scarring. Right lung is clear. Heart is normal size. Mediastinal contours are within normal limits. No acute bony abnormality. IMPRESSION: Small left pleural effusion. Left basilar linear densities, likely scarring. Electronically Signed   By: Rolm Baptise M.D.   On: 02/08/2015 15:47   Ct Angio Chest W/cm &/or Wo Cm  02/09/2015  ADDENDUM REPORT: 02/09/2015  10:09 ADDENDUM: Critical Value/emergent results were called by telephone at the time of interpretation on 02/09/2015 at 1006 hours to Dr. Garnet Koyanagi , who verbally acknowledged these results. Electronically Signed   By: Genevie Ann M.D.   On: 02/09/2015 10:09  02/09/2015  CLINICAL DATA:  54 year old female with increasing chest pain, shortness of breath, palpitations for several weeks. Initial encounter. EXAM: CT ANGIOGRAPHY CHEST WITH CONTRAST TECHNIQUE: Multidetector CT imaging of the chest was performed using the standard protocol during bolus administration of intravenous contrast. Multiplanar CT image reconstructions and MIPs were obtained to evaluate the vascular anatomy. CONTRAST:  127mL OMNIPAQUE IOHEXOL 350 MG/ML SOLN COMPARISON:  Chest radiographs 02/08/2015 and earlier. Cardiac CTA 08/12/2011. FINDINGS: Good contrast bolus timing in the pulmonary arterial tree. Positive for bilateral lower lobe pulmonary artery filling defects (posterior basal segmental branches on the left series 6 image 332, and on the right image 331). Some of these pulmonary emboli are nonocclusive. Left upper lobe clot also on series 6, image 247. No central or saddle embolus. RV LV ratio does not appear pathologic (series 6, image 379). There is an associated small layering left pleural effusion. No pericardial or right pleural effusion. Visualized aorta is negative. No mediastinal or hilar lymphadenopathy. Major airways are patent. Dependent atelectasis greater on the left. No consolidation or pulmonary infarcts identified. Negative visualized liver, spleen, adrenal glands, left kidney, and stomach in the upper abdomen. No axillary lymphadenopathy. Negative thoracic inlet. No acute osseous abnormality identified. Review of the MIP images confirms the above findings. IMPRESSION: 1. Positive for bilateral segmental pulmonary emboli. 2. No central or saddle embolus, and physiologic appearing RV/LV ratio. 3. Small layering left pleural  effusion with mild pulmonary atelectasis. Electronically Signed: By: Genevie Ann M.D. On: 02/09/2015 10:04    EKG: Independently reviewed. Normal sinus rhythm   Assessment/Plan Active Problems:   Pulmonary embolism (HCC)  Pulmonary embolism CT angiogram positive for bilateral segmental pulmonary emboli. No central or saddle embolus, no right heart strain. Patient started on IV heparin protocol. Patient can be switched to an NOAC in a.m. Morphine when necessary for pain  Depression Patient denies any suicidal thoughts at this time. Continue Wellbutrin, Depakote. When necessary Xanax.  Patient can likely discharge in a.m. if medically stable on NOAC.  Code status: DNR  Family discussion: Admission, patients condition and plan of care including tests being ordered have been discussed with the patient and her husband at bedside* who indicate understanding and agree with the  plan and Code Status.   Time Spent on Admission: 60 min  [facility] Pager: [phone] 02/09/2015, 12:14 PM  If 7PM-7AM, please contact night-coverage  [email or web address]  Password [email or web address]

## 2015-02-09 NOTE — Assessment & Plan Note (Signed)
No acute changes on Ekg Check d dimer stat If and inc in symptoms or cp returns--- go to ER-- pt understands

## 2015-02-09 NOTE — ED Notes (Signed)
Attempted report.  Floor reports that pt has not been assigned yet.

## 2015-02-09 NOTE — Progress Notes (Signed)
ANTICOAGULATION CONSULT NOTE - Initial Consult  Pharmacy Consult for heparin Indication: pulmonary embolus  Allergies  Allergen Reactions  . Cariprazine Other (See Comments)    Patient becomes suicidal when taking this medication.  Also causes tremors. Patient feels that the reactions were due to the dosing being too high.  She may restart taking in the future  . Lopressor [Metoprolol]     Hallucinations hair falls out  . Nickel Rash    Patient Measurements: Weight: 170 lb 3.2 oz (77.202 kg) Heparin Dosing Weight: 77.2 kg  Vital Signs: Temp: 98.3 F (36.8 C) (12/30 1041) BP: 137/80 mmHg (12/30 1041) Pulse Rate: 84 (12/30 1041)  Labs: No results for input(s): HGB, HCT, PLT, APTT, LABPROT, INR, HEPARINUNFRC, CREATININE, CKTOTAL, CKMB, TROPONINI in the last 72 hours.  CrCl cannot be calculated (Patient has no serum creatinine result on file.).   Medical History: Past Medical History  Diagnosis Date  . DEPRESSION   . GERD   . HYPERLIPIDEMIA   . MYALGIA   . BIPOLAR DISORDER UNSPECIFIED   . IRRITABLE BOWEL SYNDROME, HX OF   . Hypertension   . Atrial tachycardia ([place])   . Hypertension   . Bursitis of hip 09/1999    Bilateral - Dr. Berenice Primas  . Shortness of breath     Pulmonary eval 05/2002  . Mitral valve prolapse 12/17/2000    cardiolyte study 12/2000 neg  . Fistula of female genitalia 03/29/2007    enterovesical fistula, not reapired  . Anxiety   . Menopausal state 01/2012    FSH = 88.5  . Thyroid nodule 2014  . Tremors of nervous system   . Headache(784.0)     was with menstrual cycle. no longer a problem    Medications:  See EMR  Assessment: 54 yo female sent from PCP office. Pt complains of chest pain and SOB. CTA shows bilateral PE. SCr 0.8, eCrCl ~60. CBC stable, no anticoagulants PTA.    Goal of Therapy:  Heparin level 0.3-0.7 units/ml Monitor platelets by anticoagulation protocol: Yes    Plan:  -Heparin 4000 units x1 then 1250 units/hr  -Daily HL,  CBC -Check first HL in 6 hours -Monitor s/sx bleeding -F/u transition to PO anticoag    Lilienne Weins, Jake Church 02/09/2015,10:45 AM

## 2015-02-09 NOTE — Progress Notes (Signed)
[place] for heparin Indication: pulmonary embolus  Allergies  Allergen Reactions  . Cariprazine Other (See Comments)    Patient becomes suicidal when taking this medication.  Also causes tremors. Patient feels that the reactions were due to the dosing being too high.  She may restart taking in the future  . Lopressor [Metoprolol]     Hallucinations hair falls out  . Nickel Rash    Patient Measurements: Height: 5\' 9"  (175.3 cm) Weight: 130 lb 4.8 oz (59.104 kg) IBW/kg (Calculated) : 66.2 Heparin Dosing Weight: 77.2 kg  Vital Signs: Temp: 98.2 F (36.8 C) (12/30 1238) Temp Source: Oral (12/30 1238) BP: 131/56 mmHg (12/30 1450) Pulse Rate: 82 (12/30 1238)  Labs:  Recent Labs  02/09/15 0948 02/09/15 1055 02/09/15 1650  HGB 14.0 13.3  --   HCT 42.8 41.1  --   PLT 333.0 296  --   APTT  --  26  --   LABPROT  --  14.4  --   INR  --  1.10  --   HEPARINUNFRC  --   --  0.73*  CREATININE 0.94 0.89  --     Estimated Creatinine Clearance: 66.6 mL/min (by C-G formula based on Cr of 0.89).   Medical History: Past Medical History  Diagnosis Date  . DEPRESSION   . GERD   . HYPERLIPIDEMIA   . MYALGIA   . BIPOLAR DISORDER UNSPECIFIED   . IRRITABLE BOWEL SYNDROME, HX OF   . Hypertension   . Atrial tachycardia ([place])   . Hypertension   . Bursitis of hip 09/1999    Bilateral - Dr. Berenice Primas  . Shortness of breath     Pulmonary eval 05/2002  . Mitral valve prolapse 12/17/2000    cardiolyte study 12/2000 neg  . Fistula of female genitalia 03/29/2007    enterovesical fistula, not reapired  . Anxiety   . Menopausal state 01/2012    FSH = 88.5  . Thyroid nodule 2014  . Tremors of nervous system   . Headache(784.0)     was with menstrual cycle. no longer a problem  . PE (pulmonary embolism) 02/09/2015    BILATERAL    Medications:  See EMR  Assessment: 54 yo female sent from PCP office. Pt complains of chest pain and SOB. CTA shows  bilateral PE (no anticoagulants PTA). Pharmacy has ben consulted to dose heparin  -Initial heparin level= 0.73 on 1250 units/hr   Goal of Therapy:  Heparin level 0.3-0.7 units/ml Monitor platelets by anticoagulation protocol: Yes    Plan:   -Decrease heparin to 1150 units/hr -Daily HL, CBC -F/u transition to PO anticoag  Hildred Laser, Pharm D 02/09/2015 6:27 PM

## 2015-02-09 NOTE — ED Notes (Signed)
Pt was sent by PMD for confirmed PE.  Pt reports her symptoms began a week ago and consists of her not being able to lie flat at night due to SOB.

## 2015-02-10 DIAGNOSIS — I2699 Other pulmonary embolism without acute cor pulmonale: Principal | ICD-10-CM

## 2015-02-10 DIAGNOSIS — Z66 Do not resuscitate: Secondary | ICD-10-CM | POA: Diagnosis present

## 2015-02-10 DIAGNOSIS — I10 Essential (primary) hypertension: Secondary | ICD-10-CM | POA: Diagnosis present

## 2015-02-10 DIAGNOSIS — I2782 Chronic pulmonary embolism: Secondary | ICD-10-CM | POA: Diagnosis present

## 2015-02-10 DIAGNOSIS — R079 Chest pain, unspecified: Secondary | ICD-10-CM | POA: Diagnosis present

## 2015-02-10 DIAGNOSIS — Z87891 Personal history of nicotine dependence: Secondary | ICD-10-CM | POA: Diagnosis not present

## 2015-02-10 DIAGNOSIS — I341 Nonrheumatic mitral (valve) prolapse: Secondary | ICD-10-CM | POA: Diagnosis present

## 2015-02-10 DIAGNOSIS — F419 Anxiety disorder, unspecified: Secondary | ICD-10-CM | POA: Diagnosis present

## 2015-02-10 DIAGNOSIS — E785 Hyperlipidemia, unspecified: Secondary | ICD-10-CM | POA: Diagnosis present

## 2015-02-10 DIAGNOSIS — Z79890 Hormone replacement therapy (postmenopausal): Secondary | ICD-10-CM | POA: Diagnosis not present

## 2015-02-10 DIAGNOSIS — F319 Bipolar disorder, unspecified: Secondary | ICD-10-CM | POA: Diagnosis present

## 2015-02-10 DIAGNOSIS — K219 Gastro-esophageal reflux disease without esophagitis: Secondary | ICD-10-CM | POA: Diagnosis present

## 2015-02-10 DIAGNOSIS — I2602 Saddle embolus of pulmonary artery with acute cor pulmonale: Secondary | ICD-10-CM | POA: Diagnosis present

## 2015-02-10 DIAGNOSIS — Z79899 Other long term (current) drug therapy: Secondary | ICD-10-CM | POA: Diagnosis not present

## 2015-02-10 HISTORY — DX: Other pulmonary embolism without acute cor pulmonale: I26.99

## 2015-02-10 LAB — COMPREHENSIVE METABOLIC PANEL
ALT: 9 U/L — ABNORMAL LOW (ref 14–54)
AST: 13 U/L — ABNORMAL LOW (ref 15–41)
Albumin: 3.2 g/dL — ABNORMAL LOW (ref 3.5–5.0)
Alkaline Phosphatase: 42 U/L (ref 38–126)
Anion gap: 11 (ref 5–15)
BUN: 7 mg/dL (ref 6–20)
CO2: 23 mmol/L (ref 22–32)
Calcium: 8.8 mg/dL — ABNORMAL LOW (ref 8.9–10.3)
Chloride: 111 mmol/L (ref 101–111)
Creatinine, Ser: 0.81 mg/dL (ref 0.44–1.00)
GFR calc Af Amer: >60 mL/min (ref >60)
GFR calc non Af Amer: >60 mL/min (ref >60)
Glucose, Bld: 82 mg/dL (ref 65–99)
Potassium: 3.5 mmol/L (ref 3.5–5.1)
Sodium: 145 mmol/L (ref 135–145)
Total Bilirubin: 0.5 mg/dL (ref 0.3–1.2)
Total Protein: 6.1 g/dL — ABNORMAL LOW (ref 6.5–8.1)

## 2015-02-10 LAB — CBC
HCT: 39.5 % (ref 36.0–46.0)
Hemoglobin: 12.9 g/dL (ref 12.0–15.0)
MCH: 29.8 pg (ref 26.0–34.0)
MCHC: 32.7 g/dL (ref 30.0–36.0)
MCV: 91.2 fL (ref 78.0–100.0)
Platelets: 275 10*3/uL (ref 150–400)
RBC: 4.33 MIL/uL (ref 3.87–5.11)
RDW: 13.6 % (ref 11.5–15.5)
WBC: 8 10*3/uL (ref 4.0–10.5)

## 2015-02-10 LAB — HEPARIN LEVEL (UNFRACTIONATED): Heparin Unfractionated: 0.58 IU/mL (ref 0.30–0.70)

## 2015-02-10 MED ORDER — RIVAROXABAN (XARELTO) EDUCATION KIT FOR DVT/PE PATIENTS
PACK | Freq: Once | Status: AC
  Administered 2015-02-10: 12:00:00
  Filled 2015-02-10: qty 1

## 2015-02-10 MED ORDER — RIVAROXABAN 15 MG PO TABS
15.0000 mg | ORAL_TABLET | Freq: Two times a day (BID) | ORAL | Status: DC
  Administered 2015-02-10: 15 mg via ORAL
  Filled 2015-02-10: qty 1

## 2015-02-10 MED ORDER — RIVAROXABAN 20 MG PO TABS: 20.0000 mg | ORAL_TABLET | Freq: Every day | ORAL | Status: DC

## 2015-02-10 MED ORDER — RIVAROXABAN (XARELTO) VTE STARTER PACK (15 & 20 MG): ORAL_TABLET | ORAL | Status: DC

## 2015-02-10 NOTE — Progress Notes (Signed)
Utilization Review Completed.Jess Toney T12/31/2016  

## 2015-02-10 NOTE — Progress Notes (Signed)
Patient ambulated in the hallway 2x without any assistance. No breathing problems or what so ever. Tolerated it well. No oxygen needed for home at this time.

## 2015-02-10 NOTE — Discharge Summary (Signed)
Physician Discharge Summary  Beverly Cole [id number] DOB: 01-28-1961 DOA: 02/09/2015  PCP: Garnet Koyanagi, DO  Admit date: 02/09/2015 Discharge date: 02/10/2015  Time spent: 35 minutes  Recommendations for Outpatient Follow-up:  1. Outpatient anticoagulation work up   Discharge Diagnoses:  Active Problems:   Pulmonary embolism (HCC)   Pulmonary embolus [facility])   Discharge Condition: improved  Diet recommendation: cardiac  Filed Weights   02/09/15 1042 02/09/15 1238  Weight: 77.202 kg (170 lb 3.2 oz) 59.104 kg (130 lb 4.8 oz)    History of present illness:  54 year old female who  has a past medical history of DEPRESSION; GERD; HYPERLIPIDEMIA; MYALGIA; BIPOLAR DISORDER UNSPECIFIED; IRRITABLE BOWEL SYNDROME, HX OF; Hypertension; Atrial tachycardia ([place]); Hypertension; Bursitis of hip (09/1999); Shortness of breath; Mitral valve prolapse (12/17/2000); Fistula of female genitalia (03/29/2007); Anxiety; Menopausal state (01/2012); Thyroid nodule (2014); Tremors of nervous system; and Headache(784.0). Today patient was sent to the ED for pulmonary embolism confirmed by the PCP. Patient was seen at the PCP office yesterday for chest pain and complained of shortness of breath while lying on left side. Today she underwent CT injury gram which was positive for pulmonary embolism. Patient takes estrogen and progesterone hormone therapy. She denies any recent long-distance travel, no surgery. She had ECT 2 for depression. Denies chest pain at this time. No nausea vomiting or diarrhea. She did not pass out. Complains of intermittent dizziness. Patient was started on heparin protocol in the ED  Hospital Course:  Pulmonary embolism CT angiogram positive for bilateral segmental pulmonary emboli. No central or saddle embolus, no right heart strain.  -change to xarelto No CP Not de-satting -outpatient anticoagulation work up when off xarelto-- 1st episode  Depression Patient denies any  suicidal thoughts at this time. Continue Wellbutrin, Depakote. When necessary Xanax.   Procedures:    Consultations:    Discharge Exam: Filed Vitals:   02/09/15 2020 02/10/15 0530  BP: 120/49 129/76  Pulse: 80 83  Temp: 97.6 F (36.4 C) 98 F (36.7 C)  Resp: 18 18    General: awake, NAD Cardiovascular: rrr Respiratory: clear  Discharge Instructions   Discharge Instructions    Diet - low sodium heart healthy    Complete by:  As directed      Discharge instructions    Complete by:  As directed   Take xarelto with food Outpatient hypercoagulable work up once off xarelto     Increase activity slowly    Complete by:  As directed           Current Discharge Medication List    START taking these medications   Details  Rivaroxaban (XARELTO STARTER PACK) 15 & 20 MG TBPK Take as directed on package: Start with one 15mg  tablet by mouth twice a day with food. On Day 22, switch to one 20mg  tablet once a day with food. Qty: 51 each, Refills: 0      CONTINUE these medications which have NOT CHANGED   Details  ALPRAZolam (XANAX) 0.5 MG tablet Take 0.25-0.5 mg by mouth at bedtime as needed for sleep.    buPROPion (WELLBUTRIN XL) 150 MG 24 hr tablet Take 300 mg by mouth at bedtime.     Cholecalciferol (VITAMIN D3) 1000 UNITS CAPS Take 1,000 Units by mouth at bedtime.     diltiazem (CARTIA XT) 180 MG 24 hr capsule TAKE 1 CAPSULE (180 MG TOTAL) BY MOUTH DAILY. Qty: 90 capsule, Refills: 1   Associated Diagnoses: Essential hypertension    divalproex (DEPAKOTE)  500 MG 24 hr tablet Take 1,500 mg by mouth at bedtime. Dr decreasing medication until discontinued    doxycycline (ORACEA) 40 MG capsule TAKE 1 CAPSULE BY MOUTH DAILY. Qty: 90 capsule, Refills: PRN    esomeprazole (NEXIUM) 40 MG capsule TAKE 1 CAPSULE BY MOUTH AT BEDTIME. Qty: 90 capsule, Refills: 3    FINACEA 15 % FOAM Apply 1 application topically daily as needed (skin). Qty: 50 g, Refills: 3   Associated  Diagnoses: Acne vulgaris    fluticasone (FLONASE) 50 MCG/ACT nasal spray Place 2 sprays into the nose daily as needed. For allergies    lidocaine (XYLOCAINE) 2 % jelly Apply topically as needed (for dysparunia). Qty: 30 mL, Refills: 12   Associated Diagnoses: Postmenopausal atrophic vaginitis    nitrofurantoin, macrocrystal-monohydrate, (MACROBID) 100 MG capsule Take 1 capsule (100 mg total) by mouth once as needed (post coital). Qty: 30 capsule, Refills: 6    Probiotic Product (PROBIOTIC DAILY PO) Take 1 tablet by mouth daily at 6 (six) AM.    propranolol (INDERAL) 10 MG tablet Take 20 mg by mouth daily as needed (shakiness). Using for the shakiness.  Uses prn Refills: 1      STOP taking these medications     estradiol (ESTRACE) 1 MG tablet      medroxyPROGESTERone (PROVERA) 5 MG tablet      estradiol (ESTRACE) 0.1 MG/GM vaginal cream      NONFORMULARY OR COMPOUNDED ITEM        Allergies  Allergen Reactions  . Cariprazine Other (See Comments)    Patient becomes suicidal when taking this medication.  Also causes tremors. Patient feels that the reactions were due to the dosing being too high.  She may restart taking in the future  . Lopressor [Metoprolol]     Hallucinations hair falls out  . Nickel Rash   Follow-up Information    Follow up with Garnet Koyanagi, DO In 1 week.   Specialty:  Family Medicine   Contact information:   [address] [place] [address] [phone]        The results of significant diagnostics from this hospitalization (including imaging, microbiology, ancillary and laboratory) are listed below for reference.    Significant Diagnostic Studies: Dg Chest 2 View  02/08/2015  CLINICAL DATA:  Chest pain, shortness of breath for 1 month. EXAM: CHEST  2 VIEW COMPARISON:  01/01/2015 FINDINGS: Blunting of the left costophrenic angle likely reflects small left effusion. Linear densities at the left base, likely scarring. Right lung  is clear. Heart is normal size. Mediastinal contours are within normal limits. No acute bony abnormality. IMPRESSION: Small left pleural effusion. Left basilar linear densities, likely scarring. Electronically Signed   By: Rolm Baptise M.D.   On: 02/08/2015 15:47   Ct Angio Chest W/cm &/or Wo Cm  02/09/2015  ADDENDUM REPORT: 02/09/2015 10:09 ADDENDUM: Critical Value/emergent results were called by telephone at the time of interpretation on 02/09/2015 at 1006 hours to Dr. Garnet Koyanagi , who verbally acknowledged these results. Electronically Signed   By: Genevie Ann M.D.   On: 02/09/2015 10:09  02/09/2015  CLINICAL DATA:  54 year old female with increasing chest pain, shortness of breath, palpitations for several weeks. Initial encounter. EXAM: CT ANGIOGRAPHY CHEST WITH CONTRAST TECHNIQUE: Multidetector CT imaging of the chest was performed using the standard protocol during bolus administration of intravenous contrast. Multiplanar CT image reconstructions and MIPs were obtained to evaluate the vascular anatomy. CONTRAST:  150mL OMNIPAQUE IOHEXOL 350  MG/ML SOLN COMPARISON:  Chest radiographs 02/08/2015 and earlier. Cardiac CTA 08/12/2011. FINDINGS: Good contrast bolus timing in the pulmonary arterial tree. Positive for bilateral lower lobe pulmonary artery filling defects (posterior basal segmental branches on the left series 6 image 332, and on the right image 331). Some of these pulmonary emboli are nonocclusive. Left upper lobe clot also on series 6, image 247. No central or saddle embolus. RV LV ratio does not appear pathologic (series 6, image 379). There is an associated small layering left pleural effusion. No pericardial or right pleural effusion. Visualized aorta is negative. No mediastinal or hilar lymphadenopathy. Major airways are patent. Dependent atelectasis greater on the left. No consolidation or pulmonary infarcts identified. Negative visualized liver, spleen, adrenal glands, left kidney, and  stomach in the upper abdomen. No axillary lymphadenopathy. Negative thoracic inlet. No acute osseous abnormality identified. Review of the MIP images confirms the above findings. IMPRESSION: 1. Positive for bilateral segmental pulmonary emboli. 2. No central or saddle embolus, and physiologic appearing RV/LV ratio. 3. Small layering left pleural effusion with mild pulmonary atelectasis. Electronically Signed: By: Genevie Ann M.D. On: 02/09/2015 10:04    Microbiology: No results found for this or any previous visit (from the past 240 hour(s)).   Labs: Basic Metabolic Panel:  Recent Labs Lab 02/09/15 0948 02/09/15 1055 02/10/15 0300  NA 139 141 145  K 3.6 3.6 3.5  CL 103 105 111  CO2 26 22 23   GLUCOSE 83 91 82  BUN 11 9 7   CREATININE 0.94 0.89 0.81  CALCIUM 9.3 9.6 8.8*   Liver Function Tests:  Recent Labs Lab 02/09/15 0948 02/10/15 0300  AST 12 13*  ALT 8 9*  ALKPHOS 50 42  BILITOT 0.4 0.5  PROT 7.5 6.1*  ALBUMIN 4.3 3.2*   No results for input(s): LIPASE, AMYLASE in the last 168 hours. No results for input(s): AMMONIA in the last 168 hours. CBC:  Recent Labs Lab 02/09/15 0948 02/09/15 1055 02/10/15 0300  WBC 7.1 7.8 8.0  NEUTROABS 4.7 5.1  --   HGB 14.0 13.3 12.9  HCT 42.8 41.1 39.5  MCV 90.0 91.1 91.2  PLT 333.0 296 275   Cardiac Enzymes: No results for input(s): CKTOTAL, CKMB, CKMBINDEX, TROPONINI in the last 168 hours. BNP: BNP (last 3 results)  Recent Labs  02/09/15 1055  BNP 11.4    ProBNP (last 3 results) No results for input(s): PROBNP in the last 8760 hours.  CBG: No results for input(s): GLUCAP in the last 168 hours.     Signed:  Geradine Girt DO  [facility] 02/10/2015, 12:53 PM

## 2015-02-10 NOTE — Progress Notes (Signed)
Patient is d/ced this afternoon, instructions given and all paper work handed over to her. All questions answered. Assessments remained unchanged prior to discharge.

## 2015-02-10 NOTE — Progress Notes (Signed)
[place] for heparin Indication: pulmonary embolus  Allergies  Allergen Reactions  . Cariprazine Other (See Comments)    Patient becomes suicidal when taking this medication.  Also causes tremors. Patient feels that the reactions were due to the dosing being too high.  She may restart taking in the future  . Lopressor [Metoprolol]     Hallucinations hair falls out  . Nickel Rash    Patient Measurements: Height: 5\' 9"  (175.3 cm) Weight: 130 lb 4.8 oz (59.104 kg) IBW/kg (Calculated) : 66.2 Heparin Dosing Weight: 77.2 kg  Vital Signs: Temp: 98 F (36.7 C) (12/31 0530) Temp Source: Oral (12/31 0530) BP: 129/76 mmHg (12/31 0530) Pulse Rate: 83 (12/31 0530)  Labs:  Recent Labs  02/09/15 0948 02/09/15 1055 02/09/15 1650 02/10/15 0300  HGB 14.0 13.3  --  12.9  HCT 42.8 41.1  --  39.5  PLT 333.0 296  --  275  APTT  --  26  --   --   LABPROT  --  14.4  --   --   INR  --  1.10  --   --   HEPARINUNFRC  --   --  0.73* 0.58  CREATININE 0.94 0.89  --  0.81    Estimated Creatinine Clearance: 73.2 mL/min (by C-G formula based on Cr of 0.81).   Medical History: Past Medical History  Diagnosis Date  . DEPRESSION   . GERD   . HYPERLIPIDEMIA   . MYALGIA   . BIPOLAR DISORDER UNSPECIFIED   . IRRITABLE BOWEL SYNDROME, HX OF   . Hypertension   . Atrial tachycardia ([place])   . Hypertension   . Bursitis of hip 09/1999    Bilateral - Dr. Berenice Primas  . Shortness of breath     Pulmonary eval 05/2002  . Mitral valve prolapse 12/17/2000    cardiolyte study 12/2000 neg  . Fistula of female genitalia 03/29/2007    enterovesical fistula, not reapired  . Anxiety   . Menopausal state 01/2012    FSH = 88.5  . Thyroid nodule 2014  . Tremors of nervous system   . Headache(784.0)     was with menstrual cycle. no longer a problem  . PE (pulmonary embolism) 02/09/2015    BILATERAL     Assessment: 54 yo female direct admission from PCP's office. CTA  shows bilateral PE (no anticoagulants PTA). Started on IV heparin yesterday, now transition to [place]. CBC wnl. Scr 0.81, est. crcl ~ 70 ml/min.  Goal of Therapy:  Monitor platelets by anticoagulation protocol: Yes  Plan:   - xarelto 15 mg po BID, first dose now - D/C IV heparin at the same time of first dose xarelto  - continue xarelto 15 mg BID through 1/20, then change to 20 mg daily on 03/03/2015 - xarelto education with pharmacist.  Maryanna Shape, PharmD, BCPS  Clinical Pharmacist  Pager: [phone]   02/10/2015 11:08 AM

## 2015-02-10 NOTE — Discharge Instructions (Signed)
Information on my medicine - XARELTO (rivaroxaban)  This medication education was reviewed with me or my healthcare representative as part of my discharge preparation.    WHY WAS XARELTO PRESCRIBED FOR YOU? Xarelto was prescribed to treat blood clots that may have been found in the veins of your legs (deep vein thrombosis) or in your lungs (pulmonary embolism) and to reduce the risk of them occurring again.  What do you need to know about Xarelto? The starting dose is one 15 mg tablet taken TWICE daily with food for the FIRST 21 DAYS then on 03/03/2015 the dose is changed to one 20 mg tablet taken ONCE A DAY with your evening meal.  DO NOT stop taking Xarelto without talking to the health care provider who prescribed the medication.  Refill your prescription for 20 mg tablets before you run out.  After discharge, you should have regular check-up appointments with your healthcare provider that is prescribing your Xarelto.  In the future your dose may need to be changed if your kidney function changes by a significant amount.  What do you do if you miss a dose? If you are taking Xarelto TWICE DAILY and you miss a dose, take it as soon as you remember. You may take two 15 mg tablets (total 30 mg) at the same time then resume your regularly scheduled 15 mg twice daily the next day.  If you are taking Xarelto ONCE DAILY and you miss a dose, take it as soon as you remember on the same day then continue your regularly scheduled once daily regimen the next day. Do not take two doses of Xarelto at the same time.   Important Safety Information Xarelto is a blood thinner medicine that can cause bleeding. You should call your healthcare provider right away if you experience any of the following: ? Bleeding from an injury or your nose that does not stop. ? Unusual colored urine (red or dark brown) or unusual colored stools (red or black). ? Unusual bruising for unknown reasons. ? A serious fall  or if you hit your head (even if there is no bleeding).  Some medicines may interact with Xarelto and might increase your risk of bleeding while on Xarelto. To help avoid this, consult your healthcare provider or pharmacist prior to using any new prescription or non-prescription medications, including herbals, vitamins, non-steroidal anti-inflammatory drugs (NSAIDs) and supplements.  This website has more information on Xarelto: [email or web address]

## 2015-02-10 NOTE — Progress Notes (Signed)
ANTICOAGULATION CONSULT NOTE - Follow Up Consult  Pharmacy Consult for Heparin  Indication: pulmonary embolus  Allergies  Allergen Reactions  . Cariprazine Other (See Comments)    Patient becomes suicidal when taking this medication.  Also causes tremors. Patient feels that the reactions were due to the dosing being too high.  She may restart taking in the future  . Lopressor [Metoprolol]     Hallucinations hair falls out  . Nickel Rash   Patient Measurements: Height: 5\' 9"  (175.3 cm) Weight: 130 lb 4.8 oz (59.104 kg) IBW/kg (Calculated) : 66.2  Vital Signs: Temp: 97.6 F (36.4 C) (12/30 2020) Temp Source: Oral (12/30 2020) BP: 120/49 mmHg (12/30 2020) Pulse Rate: 80 (12/30 2020)  Labs:  Recent Labs  02/09/15 0948 02/09/15 1055 02/09/15 1650 02/10/15 0300  HGB 14.0 13.3  --  12.9  HCT 42.8 41.1  --  39.5  PLT 333.0 296  --  275  APTT  --  26  --   --   LABPROT  --  14.4  --   --   INR  --  1.10  --   --   HEPARINUNFRC  --   --  0.73* 0.58  CREATININE 0.94 0.89  --   --     Estimated Creatinine Clearance: 66.6 mL/min (by C-G formula based on Cr of 0.89).   Assessment: Heparin level is therapeutic this AM  Goal of Therapy:  Heparin level 0.3-0.7 units/ml Monitor platelets by anticoagulation protocol: Yes   Plan:  -Continue heparin at 1250 units/hr -1200 HL   Rick Carruthers 02/10/2015,4:25 AM

## 2015-02-13 LAB — ANA: Anti Nuclear Antibody(ANA): NEGATIVE

## 2015-02-14 ENCOUNTER — Telehealth: Payer: Self-pay | Admitting: Family Medicine

## 2015-02-14 ENCOUNTER — Emergency Department (HOSPITAL_COMMUNITY): Payer: 59

## 2015-02-14 ENCOUNTER — Emergency Department (HOSPITAL_COMMUNITY)
Admission: EM | Admit: 2015-02-14 | Discharge: 2015-02-14 | Disposition: A | Discharge status: Home / Self Care | Payer: 59 | Attending: Physician Assistant | Admitting: Physician Assistant

## 2015-02-14 ENCOUNTER — Encounter (HOSPITAL_COMMUNITY): Payer: Self-pay | Admitting: *Deleted

## 2015-02-14 DIAGNOSIS — I10 Essential (primary) hypertension: Secondary | ICD-10-CM | POA: Insufficient documentation

## 2015-02-14 DIAGNOSIS — Z8639 Personal history of other endocrine, nutritional and metabolic disease: Secondary | ICD-10-CM | POA: Diagnosis not present

## 2015-02-14 DIAGNOSIS — R251 Tremor, unspecified: Secondary | ICD-10-CM | POA: Diagnosis not present

## 2015-02-14 DIAGNOSIS — I471 Supraventricular tachycardia: Secondary | ICD-10-CM | POA: Diagnosis not present

## 2015-02-14 DIAGNOSIS — Z7901 Long term (current) use of anticoagulants: Secondary | ICD-10-CM | POA: Diagnosis not present

## 2015-02-14 DIAGNOSIS — K219 Gastro-esophageal reflux disease without esophagitis: Secondary | ICD-10-CM | POA: Insufficient documentation

## 2015-02-14 DIAGNOSIS — Z79899 Other long term (current) drug therapy: Secondary | ICD-10-CM | POA: Insufficient documentation

## 2015-02-14 DIAGNOSIS — J90 Pleural effusion, not elsewhere classified: Secondary | ICD-10-CM | POA: Diagnosis not present

## 2015-02-14 DIAGNOSIS — Z87891 Personal history of nicotine dependence: Secondary | ICD-10-CM | POA: Insufficient documentation

## 2015-02-14 DIAGNOSIS — Z792 Long term (current) use of antibiotics: Secondary | ICD-10-CM | POA: Insufficient documentation

## 2015-02-14 DIAGNOSIS — Z8739 Personal history of other diseases of the musculoskeletal system and connective tissue: Secondary | ICD-10-CM | POA: Diagnosis not present

## 2015-02-14 DIAGNOSIS — F319 Bipolar disorder, unspecified: Secondary | ICD-10-CM | POA: Insufficient documentation

## 2015-02-14 DIAGNOSIS — R0789 Other chest pain: Secondary | ICD-10-CM | POA: Insufficient documentation

## 2015-02-14 DIAGNOSIS — I2699 Other pulmonary embolism without acute cor pulmonale: Secondary | ICD-10-CM | POA: Diagnosis not present

## 2015-02-14 DIAGNOSIS — Z8742 Personal history of other diseases of the female genital tract: Secondary | ICD-10-CM | POA: Insufficient documentation

## 2015-02-14 DIAGNOSIS — R0602 Shortness of breath: Secondary | ICD-10-CM | POA: Diagnosis present

## 2015-02-14 DIAGNOSIS — R208 Other disturbances of skin sensation: Secondary | ICD-10-CM | POA: Diagnosis not present

## 2015-02-14 DIAGNOSIS — Z86711 Personal history of pulmonary embolism: Secondary | ICD-10-CM | POA: Diagnosis not present

## 2015-02-14 LAB — BASIC METABOLIC PANEL
Anion gap: 10 (ref 5–15)
BUN: 13 mg/dL (ref 6–20)
CO2: 23 mmol/L (ref 22–32)
Calcium: 9.6 mg/dL (ref 8.9–10.3)
Chloride: 108 mmol/L (ref 101–111)
Creatinine, Ser: 1.06 mg/dL — ABNORMAL HIGH (ref 0.44–1.00)
GFR calc Af Amer: >60 mL/min (ref >60)
GFR calc non Af Amer: 58 mL/min — ABNORMAL LOW (ref >60)
Glucose, Bld: 101 mg/dL — ABNORMAL HIGH (ref 65–99)
Potassium: 3.6 mmol/L (ref 3.5–5.1)
Sodium: 141 mmol/L (ref 135–145)

## 2015-02-14 LAB — CBC
HCT: 43.8 % (ref 36.0–46.0)
Hemoglobin: 14.4 g/dL (ref 12.0–15.0)
MCH: 29.9 pg (ref 26.0–34.0)
MCHC: 32.9 g/dL (ref 30.0–36.0)
MCV: 90.9 fL (ref 78.0–100.0)
Platelets: 237 10*3/uL (ref 150–400)
RBC: 4.82 MIL/uL (ref 3.87–5.11)
RDW: 13.6 % (ref 11.5–15.5)
WBC: 8.2 10*3/uL (ref 4.0–10.5)

## 2015-02-14 LAB — I-STAT TROPONIN, ED: Troponin i, poc: 0 ng/mL (ref 0.00–0.08)

## 2015-02-14 MED ORDER — IOHEXOL 350 MG/ML SOLN
100.0000 mL | Freq: Once | INTRAVENOUS | Status: AC | PRN
  Administered 2015-02-14: 100 mL via INTRAVENOUS

## 2015-02-14 MED ORDER — SODIUM CHLORIDE 0.9 % IV BOLUS (SEPSIS)
1000.0000 mL | Freq: Once | INTRAVENOUS | Status: AC
  Administered 2015-02-14: 1000 mL via INTRAVENOUS

## 2015-02-14 NOTE — ED Notes (Signed)
Pt reports recent hx of admission due to bilateral PEs. Pt having difficulty breathing and feeling sob since last night and palpitations. At triage, airway is intact. ekg done.

## 2015-02-14 NOTE — Telephone Encounter (Signed)
Per chart review pt is at [facility] ED.

## 2015-02-14 NOTE — ED Provider Notes (Signed)
CSN: [id number]     Arrival date & time 02/14/15  1138 History   First MD Initiated Contact with Patient 02/14/15 1443     Chief Complaint  Patient presents with  . Shortness of Breath     (Consider location/radiation/quality/duration/timing/severity/associated sxs/prior Treatment) HPI  55 year old female with history of depressions, hyperlipidemia, GERD, myalgias, bipolar, recently diagnosed with PE currently on Xarelto presents for evaluation of increased shortness of breath. Patient was diagnosed with bilateral segmental pulmonary emboli 5 days ago and was hospitalized for 1 day. She was discharged with Xarelto. Her initial complaint was pain in the left side of her chest. Patient mentioned her breathing did improve after being on Xarelto however since yesterday she has had intermittent central chest discomfort in which she described as burning sensation worse with breathing and unable to take a deep breath. No active discomfort at this time. No associated fever, chills, lightheadedness, dizziness, diaphoresis, nausea vomiting diarrhea, hematemesis, hematochezia or melena. She is a former smoker and denies any alcohol abuse. Patient states that this felt different from her usual GERD symptoms. Currently she denies any other complaint.     Past Medical History  Diagnosis Date  . DEPRESSION   . GERD   . HYPERLIPIDEMIA   . MYALGIA   . BIPOLAR DISORDER UNSPECIFIED   . IRRITABLE BOWEL SYNDROME, HX OF   . Hypertension   . Atrial tachycardia ([place])   . Hypertension   . Bursitis of hip 09/1999    Bilateral - Dr. Berenice Primas  . Shortness of breath     Pulmonary eval 05/2002  . Mitral valve prolapse 12/17/2000    cardiolyte study 12/2000 neg  . Fistula of female genitalia 03/29/2007    enterovesical fistula, not reapired  . Anxiety   . Menopausal state 01/2012    FSH = 88.5  . Thyroid nodule 2014  . Tremors of nervous system   . Headache(784.0)     was with menstrual cycle. no longer a problem   . PE (pulmonary embolism) 02/09/2015    BILATERAL   Past Surgical History  Procedure Laterality Date  . Lipoma (r) side  1990    Right back  . Colonoscopy     Family History  Problem Relation Age of Onset  . Lung cancer Mother   . Hypertension Mother   . Hyperlipidemia Mother   . Cancer Father     Esophageal cancer  . Hyperlipidemia Father   . Depression Father   . Colon cancer Paternal Uncle   . Sarcoidosis Other   . Testicular cancer Other   . Cancer Brother    Social History  Substance Use Topics  . Smoking status: Former Smoker -- 1.00 packs/day for 25 years    Types: Cigarettes    Quit date: 02/11/1996  . Smokeless tobacco: Never Used     Comment: Married, lives with spouse. 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 Alcohol Use: No   OB History    Gravida Para Term Preterm AB TAB SAB Ectopic Multiple Living   0 0             Review of Systems  All other systems reviewed and are negative.     Allergies  Cariprazine; Lopressor; and Nickel  Home Medications   Prior to Admission medications   Medication Sig Start Date End Date Taking? Authorizing Provider  ALPRAZolam Duanne Moron) 0.5 MG tablet Take 0.25-0.5 mg by mouth at bedtime as needed for sleep.    Historical  Provider, MD  buPROPion (WELLBUTRIN XL) 150 MG 24 hr tablet Take 300 mg by mouth at bedtime.     Historical Provider, MD  Cholecalciferol (VITAMIN D3) 1000 UNITS CAPS Take 1,000 Units by mouth at bedtime.     Historical Provider, MD  diltiazem (CARTIA XT) 180 MG 24 hr capsule TAKE 1 CAPSULE (180 MG TOTAL) BY MOUTH DAILY. 02/08/15   Rosalita Chessman, DO  divalproex (DEPAKOTE) 500 MG 24 hr tablet Take 1,500 mg by mouth at bedtime. Dr decreasing medication until discontinued    Historical Provider, MD  doxycycline (ORACEA) 40 MG capsule TAKE 1 CAPSULE BY MOUTH DAILY. 07/21/14   Rosalita Chessman, DO  esomeprazole (NEXIUM) 40 MG capsule TAKE 1 CAPSULE BY MOUTH AT BEDTIME. 11/27/14   Alferd Apa Lowne, DO   FINACEA 15 % FOAM Apply 1 application topically daily as needed (skin). 02/08/15   Alferd Apa Lowne, DO  fluticasone (FLONASE) 50 MCG/ACT nasal spray Place 2 sprays into the nose daily as needed. For allergies    Historical Provider, MD  lidocaine (XYLOCAINE) 2 % jelly Apply topically as needed (for dysparunia). 06/01/14   Kem Boroughs, FNP  nitrofurantoin, macrocrystal-monohydrate, (MACROBID) 100 MG capsule Take 1 capsule (100 mg total) by mouth once as needed (post coital). 06/01/14   Kem Boroughs, FNP  Probiotic Product (PROBIOTIC DAILY PO) Take 1 tablet by mouth daily at 6 (six) AM.    Historical Provider, MD  propranolol (INDERAL) 10 MG tablet Take 20 mg by mouth daily as needed (shakiness). Using for the shakiness.  Uses prn 11/21/14   Historical Provider, MD  Rivaroxaban (XARELTO STARTER PACK) 15 & 20 MG TBPK Take as directed on package: Start with one 15mg  tablet by mouth twice a day with food. On Day 22, switch to one 20mg  tablet once a day with food. 02/10/15   Geradine Girt, DO   BP 141/78 mmHg  Pulse 95  Temp(Src) 98 F (36.7 C) (Oral)  Resp 20  SpO2 99%  LMP 05/11/2012 Physical Exam  Constitutional: She appears well-developed and well-nourished. No distress.  HENT:  Head: Atraumatic.  Eyes: Conjunctivae are normal.  Neck: Neck supple. No JVD present.  Cardiovascular: Normal rate, regular rhythm and intact distal pulses.   Pulmonary/Chest: Effort normal and breath sounds normal. No respiratory distress. She has no wheezes. She has no rales. She exhibits no tenderness.  Abdominal: Soft. There is no tenderness.  Musculoskeletal: She exhibits no edema.  Neurological: She is alert.  Essential tremor, chronic  Skin: No rash noted.  Psychiatric: She has a normal mood and affect.  Nursing note and vitals reviewed.   ED Course  Procedures (including critical care time) Labs Review Labs Reviewed  BASIC METABOLIC PANEL - Abnormal; Notable for the following:    Glucose, Bld  101 (*)    Creatinine, Ser 1.06 (*)    GFR calc non Af Amer 58 (*)    All other components within normal limits  CBC  I-STAT TROPOININ, ED    Imaging Review Dg Chest 2 View  02/14/2015  CLINICAL DATA:  Pulmonary emboli EXAM: CHEST  2 VIEW COMPARISON:  02/08/2015 FINDINGS: Stable small left pleural effusion. There is no edema, consolidation, or pneumothorax. Normal heart size and mediastinal contours IMPRESSION: Stable since 02/08/2015, including small left effusion. Electronically Signed   By: Monte Fantasia M.D.   On: 02/14/2015 13:08   I have personally reviewed and evaluated these images and lab results as part of my medical decision-making.  EKG Interpretation None     ED ECG REPORT   Date: 02/14/2015  Rate: 97  Rhythm: normal sinus rhythm  QRS Axis: normal  Intervals: normal  ST/T Wave abnormalities: nonspecific T wave changes  Conduction Disutrbances:none  Narrative Interpretation:   Old EKG Reviewed: unchanged  I have personally reviewed the EKG tracing and agree with the computerized printout as noted.   MDM   Final diagnoses:  Burning chest pain    BP 135/76 mmHg  Pulse 83  Temp(Src) 98 F (36.7 C) (Oral)  Resp 15  SpO2 96%  LMP 05/11/2012   3:11 PM Patient presents with pleuritic chest pain, recently diagnosed with bilateral segmental PE 5 days ago and currently on Xarelto. My plan is to obtain a repeat chest CTA to evaluate her clot burden and to rule out central or saddle emboli.  Care discussed with Dr. Thomasene Lot  5:14 PM A repeat chest CT and she'll shown knee complete resolutions of pulmonary emboli with no evidence of new emboli. Decreased size of small left pleural effusion. Patient felt reassured with finding. She is currently symptom free. She is stable for discharge with outpatient follow-up. Strict return precautions discussed.  Domenic Moras, PA-C 02/14/15 [place], MD 02/16/15 [phone]

## 2015-02-14 NOTE — Discharge Instructions (Signed)
You have been evaluated for your chest discomfort.  No evidence of worsening blood clot in your lung. Please continue taking Xarelto as previously prescribed.  Take Nexium and Zantac for heart burn.  Follow up closely with your doctor for further evaluation of your condition.  Return to ER if your condition worsen or if you have other concerns.

## 2015-02-14 NOTE — Telephone Encounter (Signed)
Patient Name: Beverly Cole  DOB: 08-11-1960    Initial Comment Caller states having problems with burning in chest when takes a breathe. Recently in hospital for pulmonary embolism;    Nurse Assessment  Nurse: Orvan Seen, RN, Jacquilin Date/Time (Eastern Time): 02/14/2015 11:00:28 AM  Confirm and document reason for call. If symptomatic, describe symptoms. ---Caller states having problems with burning in chest when takes a breathe. Recently in hospital for pulmonary embolism; - Denies any SOB. Started last HS  Has the patient traveled out of the country within the last 30 days? ---Not Applicable  Does the patient have any new or worsening symptoms? ---Yes  Will a triage be completed? ---Yes  Related visit to physician within the last 2 weeks? ---No  Does the PT have any chronic conditions? (i.e. diabetes, asthma, etc.) ---Yes  List chronic conditions. ---PE  Is this a behavioral health or substance abuse call? ---No     Guidelines    Guideline Title Affirmed Question Affirmed Notes  Chest Pain [1] Chest pain lasts > 5 minutes AND [2] age > 41    Final Disposition User   Call EMS 911 Now Atkins, RN, Jacquilin    Comments  Caller states she is going to have her spouse drive her to the ER- Caller states if anything changes before he can get her to the ER she call EMS   Disagree/Comply: Disagree  Disagree/Comply Reason: Disagree with instructions

## 2015-02-15 NOTE — Telephone Encounter (Signed)
Noted D/c'd home

## 2015-02-19 MED FILL — PROPRANOLOL 10 MG TABLET: 10 | 90 days supply | Qty: 360 | Fill #0

## 2015-02-19 MED FILL — BUPROPION HCL XL 300 MG TAB: 300 | 90 days supply | Qty: 90 | Fill #0

## 2015-02-19 MED FILL — ALPRAZolam 0.25 MG TABS: 0.25 | 30 days supply | Qty: 60 | Fill #0

## 2015-02-20 ENCOUNTER — Encounter: Payer: Self-pay | Admitting: Family Medicine

## 2015-02-20 ENCOUNTER — Ambulatory Visit (INDEPENDENT_AMBULATORY_CARE_PROVIDER_SITE_OTHER): Payer: 59 | Admitting: Family Medicine

## 2015-02-20 VITALS — BP 118/80 | HR 88 | Temp 97.9°F | Ht 69.0 in | Wt 167.4 lb

## 2015-02-20 DIAGNOSIS — I2699 Other pulmonary embolism without acute cor pulmonale: Secondary | ICD-10-CM | POA: Diagnosis not present

## 2015-02-20 MED ORDER — RIVAROXABAN 20 MG PO TABS
20.0000 mg | ORAL_TABLET | Freq: Every day | ORAL | Status: DC
Start: 2015-02-20 — End: 2015-05-17

## 2015-02-20 NOTE — Progress Notes (Signed)
Patient ID: Beverly Cole, female    DOB: 02/09/1961  Age: 55 y.o. MRN: [id number]    Subjective:  Subjective HPI MARCHA Beverly Cole presents for hosp f/u.  She was diagnosed with PE b/l and was put on xaralto in hosp and d/c 02/10/2015.  Pt still has some palpitations and c/p when she lays on her Left side.  Overall she said she is feeling better.    Review of Systems  Constitutional: Negative for diaphoresis, appetite change, fatigue and unexpected weight change.  Eyes: Negative for pain, redness and visual disturbance.  Respiratory: Negative for cough, chest tightness, shortness of breath and wheezing.   Cardiovascular: Positive for chest pain and palpitations. Negative for leg swelling.  Endocrine: Negative for cold intolerance, heat intolerance, polydipsia, polyphagia and polyuria.  Genitourinary: Negative for dysuria, frequency and difficulty urinating.  Neurological: Negative for dizziness, light-headedness, numbness and headaches.    History Past Medical History  Diagnosis Date  . DEPRESSION   . GERD   . HYPERLIPIDEMIA   . MYALGIA   . BIPOLAR DISORDER UNSPECIFIED   . IRRITABLE BOWEL SYNDROME, HX OF   . Hypertension   . Atrial tachycardia ([place])   . Hypertension   . Bursitis of hip 09/1999    Bilateral - Dr. Berenice Primas  . Shortness of breath     Pulmonary eval 05/2002  . Mitral valve prolapse 12/17/2000    cardiolyte study 12/2000 neg  . Fistula of female genitalia 03/29/2007    enterovesical fistula, not reapired  . Anxiety   . Menopausal state 01/2012    FSH = 88.5  . Thyroid nodule 2014  . Tremors of nervous system   . Headache(784.0)     was with menstrual cycle. no longer a problem  . PE (pulmonary embolism) 02/09/2015    BILATERAL    She has past surgical history that includes Lipoma (R) side (1990) and Colonoscopy.   Her family history includes Cancer in her brother and father; Colon cancer in her paternal uncle; Depression in her father; Hyperlipidemia in her  father and mother; Hypertension in her mother; Lung cancer in her mother; Sarcoidosis in her other; Testicular cancer in her other.She reports that she quit smoking about 19 years ago. Her smoking use included Cigarettes. She has a 25 pack-year smoking history. She has never used smokeless tobacco. She reports that she does not drink alcohol or use illicit drugs.  Current Outpatient Prescriptions on File Prior to Visit  Medication Sig Dispense Refill  . ALPRAZolam (XANAX) 0.5 MG tablet Take 0.25-0.5 mg by mouth at bedtime as needed for sleep.    Marland Kitchen buPROPion (WELLBUTRIN XL) 150 MG 24 hr tablet Take 300 mg by mouth at bedtime.     . Cholecalciferol (VITAMIN D3) 1000 UNITS CAPS Take 1,000 Units by mouth at bedtime.     Marland Kitchen diltiazem (CARTIA XT) 180 MG 24 hr capsule TAKE 1 CAPSULE (180 MG TOTAL) BY MOUTH DAILY. 90 capsule 1  . divalproex (DEPAKOTE) 500 MG 24 hr tablet Take 1,500 mg by mouth at bedtime. Dr decreasing medication until discontinued    . doxycycline (ORACEA) 40 MG capsule TAKE 1 CAPSULE BY MOUTH DAILY. 90 capsule PRN  . esomeprazole (NEXIUM) 40 MG capsule TAKE 1 CAPSULE BY MOUTH AT BEDTIME. 90 capsule 3  . FINACEA 15 % FOAM Apply 1 application topically daily as needed (skin). 50 g 3  . fluticasone (FLONASE) 50 MCG/ACT nasal spray Place 2 sprays into the nose daily as needed. For allergies    .  lidocaine (XYLOCAINE) 2 % jelly Apply topically as needed (for dysparunia). 30 mL 12  . nitrofurantoin, macrocrystal-monohydrate, (MACROBID) 100 MG capsule Take 1 capsule (100 mg total) by mouth once as needed (post coital). 30 capsule 6  . Probiotic Product (PROBIOTIC DAILY PO) Take 1 tablet by mouth daily at 6 (six) AM.    . propranolol (INDERAL) 10 MG tablet Take 20 mg by mouth daily as needed (shakiness). Using for the shakiness.  Uses prn  1  . Rivaroxaban (XARELTO STARTER PACK) 15 & 20 MG TBPK Take as directed on package: Start with one 15mg  tablet by mouth twice a day with food. On Day 22,  switch to one 20mg  tablet once a day with food. 51 each 0   No current facility-administered medications on file prior to visit.     Objective:  Objective Physical Exam  Constitutional: She is oriented to person, place, and time. She appears well-developed and well-nourished.  HENT:  Head: Normocephalic and atraumatic.  Eyes: Conjunctivae and EOM are normal.  Neck: Normal range of motion. Neck supple. No JVD present. Carotid bruit is not present. No thyromegaly present.  Cardiovascular: Normal rate, regular rhythm and normal heart sounds.   No murmur heard. Pulmonary/Chest: Effort normal and breath sounds normal. No respiratory distress. She has no wheezes. She has no rales. She exhibits no tenderness.  Musculoskeletal: She exhibits no edema.  Neurological: She is alert and oriented to person, place, and time.  Psychiatric: She has a normal mood and affect. Her behavior is normal.  Nursing note and vitals reviewed.  BP 118/80 mmHg  Pulse 88  Temp(Src) 97.9 F (36.6 C) (Oral)  Ht 5\' 9"  (1.753 m)  Wt 167 lb 6.4 oz (75.932 kg)  BMI 24.71 kg/m2  SpO2 98%  LMP 05/11/2012 Wt Readings from Last 3 Encounters:  02/20/15 167 lb 6.4 oz (75.932 kg)  02/09/15 130 lb 4.8 oz (59.104 kg)  02/08/15 170 lb 9.6 oz (77.384 kg)     Lab Results  Component Value Date   WBC 8.2 02/14/2015   HGB 14.4 02/14/2015   HCT 43.8 02/14/2015   PLT 237 02/14/2015   GLUCOSE 101* 02/14/2015   CHOL 254* 10/31/2014   TRIG 234.0* 10/31/2014   HDL 42.30 10/31/2014   LDLDIRECT 212.0 10/31/2014   LDLCALC 158* 11/02/2013   ALT 9* 02/10/2015   AST 13* 02/10/2015   NA 141 02/14/2015   K 3.6 02/14/2015   CL 108 02/14/2015   CREATININE 1.06* 02/14/2015   BUN 13 02/14/2015   CO2 23 02/14/2015   TSH 2.37 12/15/2014   INR 1.10 02/09/2015   HGBA1C 5.3 03/20/2009   MICROALBUR 0.6 05/19/2011    Dg Chest 2 View  02/14/2015  CLINICAL DATA:  Pulmonary emboli EXAM: CHEST  2 VIEW COMPARISON:  02/08/2015  FINDINGS: Stable small left pleural effusion. There is no edema, consolidation, or pneumothorax. Normal heart size and mediastinal contours IMPRESSION: Stable since 02/08/2015, including small left effusion. Electronically Signed   By: Monte Fantasia M.D.   On: 02/14/2015 13:08   Ct Angio Chest Pe W/cm &/or Wo Cm  02/14/2015  CLINICAL DATA:  Increasing pleuritic chest pain. Segmental pulmonary emboli diagnosed in 01/2015, currently on Xarelto. EXAM: CT ANGIOGRAPHY CHEST WITH CONTRAST TECHNIQUE: Multidetector CT imaging of the chest was performed using the standard protocol during bolus administration of intravenous contrast. Multiplanar CT image reconstructions and MIPs were obtained to evaluate the vascular anatomy. CONTRAST:  133mL OMNIPAQUE IOHEXOL 350 MG/ML SOLN COMPARISON:  02/09/2015  chest CTA FINDINGS: Pulmonary arterial opacification is adequate. Emboli on the prior study are for the most part no longer clearly visible. A distal segmental embolus in the right lower lobe remains faintly visible (series 7, image 3 and 53). No new emboli are identified. Aorta is normal in caliber. Heart size is within normal limits. No enlarged axillary, mediastinal, or hilar lymph nodes are identified. There is a trace pericardial effusion. The small left pleural effusion has decreased in size. Major airways are patent. Dependent atelectasis is present in the left greater than right lower lobes. The visualized portion of the upper abdomen is unremarkable. No acute osseous abnormality is identified. Review of the MIP images confirms the above findings. IMPRESSION: 1. Near complete resolution of pulmonary emboli from the prior study. No evidence of new emboli. 2. Decreased size of small left pleural effusion. Bibasilar atelectasis. Electronically Signed   By: Logan Bores M.D.   On: 02/14/2015 16:45     Assessment & Plan:  Plan I am having Ms. Brickner start on rivaroxaban. I am also having her maintain her divalproex,  fluticasone, Vitamin D3, buPROPion, Probiotic Product (PROBIOTIC DAILY PO), nitrofurantoin (macrocrystal-monohydrate), lidocaine, doxycycline, esomeprazole, propranolol, FINACEA, diltiazem, ALPRAZolam, and Rivaroxaban.  Meds ordered this encounter  Medications  . rivaroxaban (XARELTO) 20 MG TABS tablet    Sig: Take 1 tablet (20 mg total) by mouth daily with supper.    Dispense:  30 tablet    Refill:  2    Problem List Items Addressed This Visit    None    Visit Diagnoses    Other acute pulmonary embolism without acute cor pulmonale (HCC)    -  Primary    Relevant Medications    rivaroxaban (XARELTO) 20 MG TABS tablet    Other Relevant Orders    Ambulatory referral to Hematology     per d/c summary -- refer to hematology --- will need anticoag w/u when off xaralto Probably 6 months of therapy  Follow-up: Return in about 3 months (around 05/21/2015), or if symptoms worsen or fail to improve.  Garnet Koyanagi, DO

## 2015-02-20 NOTE — Patient Instructions (Signed)

## 2015-02-20 NOTE — Progress Notes (Signed)
Pre visit review using our clinic review tool, if applicable. No additional management support is needed unless otherwise documented below in the visit note. 

## 2015-02-28 ENCOUNTER — Encounter: Payer: Self-pay | Admitting: Family Medicine

## 2015-03-01 MED FILL — ESOMEPRAZOLE MAG DR 40 MG C: 40 | 90 days supply | Qty: 90 | Fill #1

## 2015-03-05 MED FILL — XARELTO 20 MG TABLET: 20 | 30 days supply | Qty: 30 | Fill #0

## 2015-03-21 ENCOUNTER — Encounter: Payer: Self-pay | Admitting: Hematology & Oncology

## 2015-03-21 ENCOUNTER — Other Ambulatory Visit (HOSPITAL_BASED_OUTPATIENT_CLINIC_OR_DEPARTMENT_OTHER): Payer: 59

## 2015-03-21 ENCOUNTER — Ambulatory Visit: Payer: 59

## 2015-03-21 ENCOUNTER — Ambulatory Visit (HOSPITAL_BASED_OUTPATIENT_CLINIC_OR_DEPARTMENT_OTHER): Payer: 59 | Admitting: Hematology & Oncology

## 2015-03-21 VITALS — BP 133/67 | HR 113 | Temp 97.6°F | Resp 18 | Ht 69.0 in | Wt 164.0 lb

## 2015-03-21 DIAGNOSIS — I2699 Other pulmonary embolism without acute cor pulmonale: Secondary | ICD-10-CM | POA: Diagnosis not present

## 2015-03-21 DIAGNOSIS — I2602 Saddle embolus of pulmonary artery with acute cor pulmonale: Secondary | ICD-10-CM | POA: Diagnosis not present

## 2015-03-21 LAB — COMPREHENSIVE METABOLIC PANEL (CC13)
ALT: 10 IU/L (ref 0–32)
AST (SGOT): 13 IU/L (ref 0–40)
Albumin, Serum: 4.6 g/dL (ref 3.5–5.5)
Albumin/Globulin Ratio: 2.1 (ref 1.1–2.5)
Alkaline Phosphatase, S: 50 IU/L (ref 39–117)
BUN/Creatinine Ratio: 12 (ref 9–23)
BUN: 10 mg/dL (ref 6–24)
Bilirubin Total: 0.3 mg/dL (ref 0.0–1.2)
Calcium, Ser: 9.5 mg/dL (ref 8.7–10.2)
Carbon Dioxide, Total: 24 mmol/L (ref 18–29)
Chloride, Ser: 103 mmol/L (ref 96–106)
Creatinine, Ser: 0.83 mg/dL (ref 0.57–1.00)
GFR calc Af Amer: 92 mL/min/{1.73_m2} (ref >59)
GFR calc non Af Amer: 80 mL/min/{1.73_m2} (ref >59)
Globulin, Total: 2.2 g/dL (ref 1.5–4.5)
Glucose: 77 mg/dL (ref 65–99)
Potassium, Ser: 4 mmol/L (ref 3.5–5.2)
Sodium: 136 mmol/L (ref 134–144)
Total Protein: 6.8 g/dL (ref 6.0–8.5)

## 2015-03-21 LAB — CBC WITH DIFFERENTIAL (CANCER CENTER ONLY)
BASO#: 0 10*3/uL (ref 0.0–0.2)
BASO%: 0.3 % (ref 0.0–2.0)
EOS%: 0.8 % (ref 0.0–7.0)
Eosinophils Absolute: 0.1 10*3/uL (ref 0.0–0.5)
HCT: 42.2 % (ref 34.8–46.6)
HGB: 14 g/dL (ref 11.6–15.9)
LYMPH#: 2.5 10*3/uL (ref 0.9–3.3)
LYMPH%: 34 % (ref 14.0–48.0)
MCH: 29.9 pg (ref 26.0–34.0)
MCHC: 33.2 g/dL (ref 32.0–36.0)
MCV: 90 fL (ref 81–101)
MONO#: 0.7 10*3/uL (ref 0.1–0.9)
MONO%: 8.9 % (ref 0.0–13.0)
NEUT#: 4.1 10*3/uL (ref 1.5–6.5)
NEUT%: 56 % (ref 39.6–80.0)
Platelets: 193 10*3/uL (ref 145–400)
RBC: 4.69 10*6/uL (ref 3.70–5.32)
RDW: 13.7 % (ref 11.1–15.7)
WBC: 7.3 10*3/uL (ref 3.9–10.0)

## 2015-03-21 NOTE — Progress Notes (Signed)
Referral MD  Reason for Referral: Pulmonary embolism while taking hormone replacement therapy   Chief Complaint  Patient presents with  . OTHER    New Patient  : I had a blood clot in my lung.  HPI: Beverly Cole is a very nice 55 year old white female. She is originally from [place]. She does have multiple medical and psychological issues.  She had been on hormone replacement therapy. This is for menopausal symptoms. She had been on this for about a year and a half.  She saw her family doctor. She was complaining of some shortness of breath. She could not lie down when she went to sleep.  She had a CT angiogram done. Surprisingly enough, this showed that she had pulmonary emboli. These were bilateral segmental emboli. No Dopplers were done of the legs.  She was placed on Xarelto. This was in late December.  No hypercoagulable studies were done at the time.  She does not smoke. She currently is not working. She's had no leg swelling or leg pain. She's had no change in bowel or bladder habits.  She had a colonoscopy about 6 years ago. This was normal what she says.  She had a repeat CT angiogram done. This was done on January 4. This showed near complete resolution of the pulmonary emboli. No new emboli were noted. She had decreased left pleural effusion.  She was kindly referred to the [facility] for further recommendations for anticoagulation.  She does not eat much. She does not smoke. She does not drink. She's had no surgeries in the past. No one in the family has had blood clot problems. She says that a maternal and had stents in her leg which sounds like peripheral arterial disease.  Her husband came with her. He also is from [place]. As above   Past Medical History  Diagnosis Date  . DEPRESSION   . GERD   . HYPERLIPIDEMIA   . MYALGIA   . BIPOLAR DISORDER UNSPECIFIED   . IRRITABLE BOWEL SYNDROME, HX OF   . Hypertension   . Atrial tachycardia ([place])   .  Hypertension   . Bursitis of hip 09/1999    Bilateral - Dr. Berenice Primas  . Shortness of breath     Pulmonary eval 05/2002  . Mitral valve prolapse 12/17/2000    cardiolyte study 12/2000 neg  . Fistula of female genitalia 03/29/2007    enterovesical fistula, not reapired  . Anxiety   . Menopausal state 01/2012    FSH = 88.5  . Thyroid nodule 2014  . Tremors of nervous system   . Headache(784.0)     was with menstrual cycle. no longer a problem  . PE (pulmonary embolism) 02/09/2015    BILATERAL  :  Past Surgical History  Procedure Laterality Date  . Lipoma (r) side  1990    Right back  . Colonoscopy    :   Current outpatient prescriptions:  .  ALPRAZolam (XANAX) 0.5 MG tablet, Take 0.25-0.5 mg by mouth at bedtime as needed for sleep., Disp: , Rfl:  .  buPROPion (WELLBUTRIN XL) 150 MG 24 hr tablet, Take 300 mg by mouth at bedtime. , Disp: , Rfl:  .  Cholecalciferol (VITAMIN D3) 1000 UNITS CAPS, Take 1,000 Units by mouth at bedtime. , Disp: , Rfl:  .  diltiazem (CARTIA XT) 180 MG 24 hr capsule, TAKE 1 CAPSULE (180 MG TOTAL) BY MOUTH DAILY., Disp: 90 capsule, Rfl: 1 .  divalproex (DEPAKOTE) 500 MG 24  hr tablet, Take 1,500 mg by mouth at bedtime. Dr decreasing medication until discontinued, Disp: , Rfl:  .  doxycycline (ORACEA) 40 MG capsule, TAKE 1 CAPSULE BY MOUTH DAILY., Disp: 90 capsule, Rfl: PRN .  esomeprazole (NEXIUM) 40 MG capsule, TAKE 1 CAPSULE BY MOUTH AT BEDTIME., Disp: 90 capsule, Rfl: 3 .  FINACEA 15 % FOAM, Apply 1 application topically daily as needed (skin)., Disp: 50 g, Rfl: 3 .  fluticasone (FLONASE) 50 MCG/ACT nasal spray, Place 2 sprays into the nose daily as needed. For allergies, Disp: , Rfl:  .  lidocaine (XYLOCAINE) 2 % jelly, Apply topically as needed (for dysparunia)., Disp: 30 mL, Rfl: 12 .  nitrofurantoin, macrocrystal-monohydrate, (MACROBID) 100 MG capsule, Take 1 capsule (100 mg total) by mouth once as needed (post coital)., Disp: 30 capsule, Rfl: 6 .   Probiotic Product (PROBIOTIC DAILY PO), Take 1 tablet by mouth daily at 6 (six) AM., Disp: , Rfl:  .  propranolol (INDERAL) 10 MG tablet, Take 20 mg by mouth daily as needed (shakiness). Using for the shakiness.  Uses prn, Disp: , Rfl: 1 .  rivaroxaban (XARELTO) 20 MG TABS tablet, Take 1 tablet (20 mg total) by mouth daily with supper., Disp: 30 tablet, Rfl: 2:  :  Allergies  Allergen Reactions  . Cariprazine Other (See Comments)    Patient becomes suicidal when taking this medication.  Also causes tremors. Patient feels that the reactions were due to the dosing being too high.    Marland Kitchen Lopressor [Metoprolol]     Hallucinations hair falls out  . Nickel Rash  :  Family History  Problem Relation Age of Onset  . Lung cancer Mother   . Hypertension Mother   . Hyperlipidemia Mother   . Cancer Father     Esophageal cancer  . Hyperlipidemia Father   . Depression Father   . Colon cancer Paternal Uncle   . Sarcoidosis Other   . Testicular cancer Other   . Cancer Brother   :  Social History   Social History  . Marital Status: Married    Spouse Name: N/A  . Number of Children: 0  . Years of Education: N/A   Occupational History  . 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 Smoking status: Former Smoker -- 1.00 packs/day for 25 years    Types: Cigarettes    Quit date: 02/11/1996  . Smokeless tobacco: Never Used     Comment: Married, lives with spouse. 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 Alcohol Use: No  . Drug Use: No  . Sexual Activity:    Partners: Male    Birth Control/ Protection: Post-menopausal   Other Topics Concern  . Not on file   Social History Narrative   Married, lives with spouse in [place]. 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  Exam: @IPVITALS @  well-developed and well-nourished white female in no obvious distress. Vital signs are  temperature of 97 6. Pulse 113. Blood pressure 133/67. Weight is 164 pounds. Head and neck exam shows no ocular or oral lesions. She has no palpable cervical or supraclavicular lymph nodes. Lungs are clear. She has no rales, wheezes or rhonchi. No friction rub is noted. Cardiac exam is tachycardia regular. She has no murmurs, rubs or bruits. Abdomen is soft. She has good bowel  sounds. There is no fluid wave. There is no palpable liver or spleen tip. Extremity shows no clubbing, cyanosis or edema. She has negative venous cords in the legs. She a negative Homans sign. She has good pulses in her feet. Neurological exam is nonfocal. Skin exam shows no suspicious skin lesions.    Recent Labs  03/21/15 1455  WBC 7.3  HGB 14.0  HCT 42.2  PLT 193   No results for input(s): NA, K, CL, CO2, GLUCOSE, BUN, CREATININE, CALCIUM in the last 72 hours.  Blood smear review:  None  Pathology: None     Assessment and Plan:  Beverly Cole is a 55 year old white female with a pulmonary embolism. She's never had one before. She is on hormone replacement therapy while this happened.  Because of the fact that she was on HRT, we should do a hypercoagulable workup on her make sure she does not have anything that is thrombophilic.  She does not have any kids. She's never had any miscarriages so we would not have to worry about any future generations if she is positive for a thrombophilic state.  I told her and her husband she needs to be on blood thinner for one year. I think blood clots in the lung require a longer duration of anticoagulation.  She is having some abdominal pain. I told her that Xarelto can cause abdominal discomfort on occasion. If this continues, then she can always switch from Xarelto to [facility], even though ELIQUIS is twice a day dosing.  I spent about 45 minutes with she and her husband. Would like to get Dopplers of her legs to make sure that nothing is going on in the legs. I can get this when we  see her back.  I will plan to see her back in 4 months.

## 2015-03-22 DIAGNOSIS — F3181 Bipolar II disorder: Secondary | ICD-10-CM | POA: Diagnosis not present

## 2015-03-23 LAB — PROTEIN S, TOTAL: Protein S, Total: 135 % (ref 60–150)

## 2015-03-23 LAB — PROTEIN C, TOTAL: Protein C Antigen: 105 % (ref 60–150)

## 2015-03-23 LAB — CARDIOLIPIN ANTIBODIES, IGG, IGM, IGA
Anticardiolipin Ab,IgA,Qn: <9 APL U/mL (ref 0–11)
Anticardiolipin Ab,IgG,Qn: <9 GPL U/mL (ref 0–14)
Anticardiolipin Ab,IgM,Qn: <9 MPL U/mL (ref 0–12)

## 2015-03-23 LAB — PROTEIN S ACTIVITY: Protein S-Functional: 59 % — ABNORMAL LOW (ref 63–140)

## 2015-03-23 LAB — LUPUS ANTICOAGULANT PANEL
PTT-LA: 35.2 s (ref 0.0–40.6)
dRVVT Confirm: 1.3 ratio — ABNORMAL HIGH (ref 0.8–1.2)
dRVVT Mix: 47.8 s — ABNORMAL HIGH (ref 0.0–44.0)
dRVVT: 63.5 s — ABNORMAL HIGH (ref 0.0–44.0)

## 2015-03-23 LAB — PROTEIN C ACTIVITY: Protein C-Functional: 130 % (ref 73–180)

## 2015-03-23 LAB — ANTITHROMBIN III: Antithrombin Activity: 125 % (ref 75–135)

## 2015-03-23 MED FILL — LITHIUM CARBONATE 300 MG CA: 300 | 30 days supply | Qty: 60 | Fill #0

## 2015-03-23 MED FILL — DOXYCYCLINE IR-DR 40 MG CAP: 40 | 90 days supply | Qty: 90 | Fill #1

## 2015-03-24 LAB — BETA-2-GLYCOPROTEIN I ABS, IGG/M/A
Beta-2 Glyco 1 IgA: <9 GPI IgA units (ref 0–25)
Beta-2 Glyco 1 IgM: <9 GPI IgM units (ref 0–32)
Beta-2 Glycoprotein I Ab, IgG: <9 GPI IgG units (ref 0–20)

## 2015-03-28 LAB — FACTOR 5 LEIDEN

## 2015-03-28 LAB — PROTHROMBIN GENE MUTATION

## 2015-04-02 MED FILL — BUPROPION HCL SR 200 MG TAB: 200 | 30 days supply | Qty: 30 | Fill #0

## 2015-04-02 MED FILL — traZODone HCL 50 MG TABS: 50 | 30 days supply | Qty: 30 | Fill #0

## 2015-04-03 DIAGNOSIS — F3181 Bipolar II disorder: Secondary | ICD-10-CM | POA: Diagnosis not present

## 2015-04-04 DIAGNOSIS — F314 Bipolar disorder, current episode depressed, severe, without psychotic features: Secondary | ICD-10-CM | POA: Diagnosis not present

## 2015-04-05 MED FILL — XARELTO 20 MG TABLET: 20 | 30 days supply | Qty: 30 | Fill #1

## 2015-04-12 MED FILL — DILTIAZEM 24HR ER 180 MG CA: 180 | 90 days supply | Qty: 90 | Fill #0

## 2015-04-17 DIAGNOSIS — Z79899 Other long term (current) drug therapy: Secondary | ICD-10-CM | POA: Diagnosis not present

## 2015-04-17 DIAGNOSIS — F314 Bipolar disorder, current episode depressed, severe, without psychotic features: Secondary | ICD-10-CM | POA: Diagnosis not present

## 2015-04-17 DIAGNOSIS — F3181 Bipolar II disorder: Secondary | ICD-10-CM | POA: Diagnosis not present

## 2015-04-17 DIAGNOSIS — R260 Ataxic gait: Secondary | ICD-10-CM | POA: Diagnosis not present

## 2015-04-18 MED FILL — PRAMIPEXOLE 0.25 MG TABLET: 0.25 | 30 days supply | Qty: 30 | Fill #0

## 2015-04-23 MED FILL — traZODone HCL 50 MG TABS: 50 | 90 days supply | Qty: 90 | Fill #0

## 2015-04-30 MED FILL — XARELTO 20 MG TABLET: 20 | 30 days supply | Qty: 30 | Fill #2

## 2015-05-02 MED FILL — BUPROPION HCL SR 200 MG TAB: 200 | 90 days supply | Qty: 90 | Fill #0

## 2015-05-08 DIAGNOSIS — F3181 Bipolar II disorder: Secondary | ICD-10-CM | POA: Diagnosis not present

## 2015-05-15 DIAGNOSIS — F3181 Bipolar II disorder: Secondary | ICD-10-CM | POA: Diagnosis not present

## 2015-05-17 ENCOUNTER — Encounter: Payer: Self-pay | Admitting: Family Medicine

## 2015-05-17 ENCOUNTER — Ambulatory Visit (INDEPENDENT_AMBULATORY_CARE_PROVIDER_SITE_OTHER): Payer: 59 | Admitting: Family Medicine

## 2015-05-17 VITALS — BP 116/74 | HR 75 | Temp 98.3°F | Ht 69.0 in | Wt 157.8 lb

## 2015-05-17 DIAGNOSIS — F3181 Bipolar II disorder: Secondary | ICD-10-CM

## 2015-05-17 DIAGNOSIS — I2699 Other pulmonary embolism without acute cor pulmonale: Secondary | ICD-10-CM | POA: Diagnosis not present

## 2015-05-17 DIAGNOSIS — F3160 Bipolar disorder, current episode mixed, unspecified: Secondary | ICD-10-CM | POA: Diagnosis not present

## 2015-05-17 MED ORDER — RIVAROXABAN 20 MG PO TABS: 20.0000 mg | ORAL_TABLET | Freq: Every day | ORAL | Status: DC

## 2015-05-17 NOTE — Patient Instructions (Signed)

## 2015-05-17 NOTE — Progress Notes (Signed)
Pre visit review using our clinic review tool, if applicable. No additional management support is needed unless otherwise documented below in the visit note. 

## 2015-05-19 DIAGNOSIS — F319 Bipolar disorder, unspecified: Secondary | ICD-10-CM | POA: Insufficient documentation

## 2015-05-19 NOTE — Progress Notes (Signed)
Patient ID: Beverly Cole, female    DOB: [birth year]  Age: 55 y.o. MRN: [id number]    Subjective:  Subjective HPI Beverly Cole presents for f/u PE.  She has been to psych and has had ECT.  She has not felt right since.  She is still working with psychiatry to get her meds correct.    Review of Systems  Constitutional: Negative for diaphoresis, appetite change, fatigue and unexpected weight change.  Eyes: Negative for pain, redness and visual disturbance.  Respiratory: Negative for cough, chest tightness, shortness of breath and wheezing.   Cardiovascular: Negative for chest pain, palpitations and leg swelling.  Endocrine: Negative for cold intolerance, heat intolerance, polydipsia, polyphagia and polyuria.  Genitourinary: Negative for dysuria, frequency and difficulty urinating.  Neurological: Negative for dizziness, light-headedness, numbness and headaches.    History Past Medical History  Diagnosis Date  . DEPRESSION   . GERD   . HYPERLIPIDEMIA   . MYALGIA   . BIPOLAR DISORDER UNSPECIFIED   . IRRITABLE BOWEL SYNDROME, HX OF   . Hypertension   . Atrial tachycardia ([place])   . Hypertension   . Bursitis of hip 09/1999    Bilateral - Dr. Berenice Primas  . Shortness of breath     Pulmonary eval 05/2002  . Mitral valve prolapse 12/17/2000    cardiolyte study 12/2000 neg  . Fistula of female genitalia 03/29/2007    enterovesical fistula, not reapired  . Anxiety   . Menopausal state 01/2012    FSH = 88.5  . Thyroid nodule 2014  . Tremors of nervous system   . Headache(784.0)     was with menstrual cycle. no longer a problem  . PE (pulmonary embolism) 02/09/2015    BILATERAL    She has past surgical history that includes Lipoma (R) side (1990) and Colonoscopy.   Her family history includes Cancer in her brother and father; Colon cancer in her paternal uncle; Depression in her father; Hyperlipidemia in her father and mother; Hypertension in her mother; Lung cancer in her mother;  Sarcoidosis in her other; Testicular cancer in her other.She reports that she quit smoking about 19 years ago. Her smoking use included Cigarettes. She has a 25 pack-year smoking history. She has never used smokeless tobacco. She reports that she does not drink alcohol or use illicit drugs.  Current Outpatient Prescriptions on File Prior to Visit  Medication Sig Dispense Refill  . ALPRAZolam (XANAX) 0.5 MG tablet Take 0.25-0.5 mg by mouth at bedtime as needed for sleep.    . Cholecalciferol (VITAMIN D3) 1000 UNITS CAPS Take 1,000 Units by mouth at bedtime.     Marland Kitchen diltiazem (CARTIA XT) 180 MG 24 hr capsule TAKE 1 CAPSULE (180 MG TOTAL) BY MOUTH DAILY. 90 capsule 1  . divalproex (DEPAKOTE) 500 MG 24 hr tablet Take 1,000 mg by mouth at bedtime. Dr decreasing medication until discontinued    . doxycycline (ORACEA) 40 MG capsule TAKE 1 CAPSULE BY MOUTH DAILY. 90 capsule PRN  . esomeprazole (NEXIUM) 40 MG capsule TAKE 1 CAPSULE BY MOUTH AT BEDTIME. 90 capsule 3  . FINACEA 15 % FOAM Apply 1 application topically daily as needed (skin). 50 g 3  . fluticasone (FLONASE) 50 MCG/ACT nasal spray Place 2 sprays into the nose daily as needed. For allergies    . lidocaine (XYLOCAINE) 2 % jelly Apply topically as needed (for dysparunia). 30 mL 12  . nitrofurantoin, macrocrystal-monohydrate, (MACROBID) 100 MG capsule Take 1 capsule (100 mg total) by mouth  once as needed (post coital). 30 capsule 6  . Probiotic Product (PROBIOTIC DAILY PO) Take 1 tablet by mouth daily at 6 (six) AM.    . propranolol (INDERAL) 10 MG tablet Take 20 mg by mouth daily as needed (shakiness). Reported on 05/17/2015  1   No current facility-administered medications on file prior to visit.     Objective:  Objective Physical Exam  Constitutional: She is oriented to person, place, and time. She appears well-developed and well-nourished.  HENT:  Head: Normocephalic and atraumatic.  Eyes: Conjunctivae and EOM are normal.  Neck: Normal  range of motion. Neck supple. No JVD present. Carotid bruit is not present. No thyromegaly present.  Cardiovascular: Normal rate, regular rhythm and normal heart sounds.   No murmur heard. Pulmonary/Chest: Effort normal and breath sounds normal. No respiratory distress. She has no wheezes. She has no rales. She exhibits no tenderness.  Musculoskeletal: She exhibits no edema.  Neurological: She is alert and oriented to person, place, and time.  Psychiatric: She has a normal mood and affect.  Nursing note and vitals reviewed.  BP 116/74 mmHg  Pulse 75  Temp(Src) 98.3 F (36.8 C) (Oral)  Ht 5\' 9"  (1.753 m)  Wt 157 lb 12.8 oz (71.578 kg)  BMI 23.29 kg/m2  SpO2 98%  LMP 05/11/2012 Wt Readings from Last 3 Encounters:  05/17/15 157 lb 12.8 oz (71.578 kg)  03/21/15 164 lb (74.39 kg)  02/20/15 167 lb 6.4 oz (75.932 kg)     Lab Results  Component Value Date   WBC 7.3 03/21/2015   HGB 14.0 03/21/2015   HCT 42.2 03/21/2015   PLT 193 03/21/2015   GLUCOSE 77 03/21/2015   CHOL 254* 10/31/2014   TRIG 234.0* 10/31/2014   HDL 42.30 10/31/2014   LDLDIRECT 212.0 10/31/2014   LDLCALC 158* 11/02/2013   ALT 10 03/21/2015   AST 13 03/21/2015   NA 136 03/21/2015   K 4.0 03/21/2015   CL 103 03/21/2015   CREATININE 0.83 03/21/2015   BUN 10 03/21/2015   CO2 24 03/21/2015   TSH 2.37 12/15/2014   INR 1.10 02/09/2015   HGBA1C 5.3 03/20/2009   MICROALBUR 0.6 05/19/2011    Dg Chest 2 View  02/14/2015  CLINICAL DATA:  Pulmonary emboli EXAM: CHEST  2 VIEW COMPARISON:  02/08/2015 FINDINGS: Stable small left pleural effusion. There is no edema, consolidation, or pneumothorax. Normal heart size and mediastinal contours IMPRESSION: Stable since 02/08/2015, including small left effusion. Electronically Signed   By: Monte Fantasia M.D.   On: 02/14/2015 13:08   Ct Angio Chest Pe W/cm &/or Wo Cm  02/14/2015  CLINICAL DATA:  Increasing pleuritic chest pain. Segmental pulmonary emboli diagnosed in 01/2015,  currently on Xarelto. EXAM: CT ANGIOGRAPHY CHEST WITH CONTRAST TECHNIQUE: Multidetector CT imaging of the chest was performed using the standard protocol during bolus administration of intravenous contrast. Multiplanar CT image reconstructions and MIPs were obtained to evaluate the vascular anatomy. CONTRAST:  190mL OMNIPAQUE IOHEXOL 350 MG/ML SOLN COMPARISON:  02/09/2015 chest CTA FINDINGS: Pulmonary arterial opacification is adequate. Emboli on the prior study are for the most part no longer clearly visible. A distal segmental embolus in the right lower lobe remains faintly visible (series 7, image 3 and 53). No new emboli are identified. Aorta is normal in caliber. Heart size is within normal limits. No enlarged axillary, mediastinal, or hilar lymph nodes are identified. There is a trace pericardial effusion. The small left pleural effusion has decreased in size. Major airways are  patent. Dependent atelectasis is present in the left greater than right lower lobes. The visualized portion of the upper abdomen is unremarkable. No acute osseous abnormality is identified. Review of the MIP images confirms the above findings. IMPRESSION: 1. Near complete resolution of pulmonary emboli from the prior study. No evidence of new emboli. 2. Decreased size of small left pleural effusion. Bibasilar atelectasis. Electronically Signed   By: Logan Bores M.D.   On: 02/14/2015 16:45     Assessment & Plan:  Plan I have discontinued Beverly Cole buPROPion. I am also having her maintain her divalproex, fluticasone, Vitamin D3, Probiotic Product (PROBIOTIC DAILY PO), nitrofurantoin (macrocrystal-monohydrate), lidocaine, doxycycline, esomeprazole, propranolol, FINACEA, diltiazem, ALPRAZolam, traZODone, pramipexole, and rivaroxaban.  Meds ordered this encounter  Medications  . traZODone (DESYREL) 50 MG tablet    Sig: Take 1 tablet by mouth at bedtime.    Refill:  0  . pramipexole (MIRAPEX) 0.25 MG tablet    Sig: Take 1  tablet by mouth daily.    Refill:  1  . rivaroxaban (XARELTO) 20 MG TABS tablet    Sig: Take 1 tablet (20 mg total) by mouth daily with supper.    Dispense:  30 tablet    Refill:  2    Problem List Items Addressed This Visit      Unprioritized   Pulmonary embolus ([place]) - Primary   Relevant Medications   rivaroxaban (XARELTO) 20 MG TABS tablet      Follow-up: Return if symptoms worsen or fail to improve.  Ann Held, DO

## 2015-05-19 NOTE — Assessment & Plan Note (Signed)
Per psych 

## 2015-05-19 NOTE — Assessment & Plan Note (Signed)
con't xaralto for 1 year per hematology

## 2015-05-28 MED FILL — ESOMEPRAZOLE MAG DR 40 MG C: 40 | 90 days supply | Qty: 90 | Fill #2

## 2015-05-29 DIAGNOSIS — F3181 Bipolar II disorder: Secondary | ICD-10-CM | POA: Diagnosis not present

## 2015-05-29 MED FILL — XARELTO 20 MG TABLET: 20 | 30 days supply | Qty: 30 | Fill #0

## 2015-06-05 ENCOUNTER — Ambulatory Visit (INDEPENDENT_AMBULATORY_CARE_PROVIDER_SITE_OTHER): Payer: 59 | Admitting: Nurse Practitioner

## 2015-06-05 ENCOUNTER — Encounter: Payer: Self-pay | Admitting: Nurse Practitioner

## 2015-06-05 VITALS — BP 100/78 | HR 86 | Resp 16 | Ht 69.0 in | Wt 158.0 lb

## 2015-06-05 DIAGNOSIS — I2699 Other pulmonary embolism without acute cor pulmonale: Secondary | ICD-10-CM

## 2015-06-05 DIAGNOSIS — N952 Postmenopausal atrophic vaginitis: Secondary | ICD-10-CM | POA: Diagnosis not present

## 2015-06-05 DIAGNOSIS — Z1211 Encounter for screening for malignant neoplasm of colon: Secondary | ICD-10-CM

## 2015-06-05 DIAGNOSIS — Z0000 Encounter for general adult medical examination without abnormal findings: Secondary | ICD-10-CM

## 2015-06-05 DIAGNOSIS — Z1151 Encounter for screening for human papillomavirus (HPV): Secondary | ICD-10-CM | POA: Diagnosis not present

## 2015-06-05 DIAGNOSIS — Z01419 Encounter for gynecological examination (general) (routine) without abnormal findings: Secondary | ICD-10-CM | POA: Diagnosis not present

## 2015-06-05 MED ORDER — LIDOCAINE HCL 2 % EX GEL: CUTANEOUS | Status: DC | PRN

## 2015-06-05 MED ORDER — NITROFURANTOIN MONOHYD MACRO 100 MG PO CAPS: 100.0000 mg | ORAL_CAPSULE | Freq: Once | ORAL | Status: DC | PRN

## 2015-06-05 MED FILL — NITROFURANTOIN MONO-MCR 100: 100 | 30 days supply | Qty: 30 | Fill #0

## 2015-06-05 NOTE — Progress Notes (Signed)
55 y.o. G0P0 Married  Caucasian Fe here for annual exam.  New diagnosis of PE bilateral lungs while on HRT.  Initially on Heparin and now on Xeralto.  She has been seen and is having work up for blood clotting disorders.  They are not sure this was related to her HRT.  Patient's last menstrual period was 05/11/2012.          Sexually active: Yes.    The current method of family planning is post menopausal status.    Exercising: No.  The patient does not participate in regular exercise at present. Smoker:  no  Health Maintenance: Pap:  06/02/14 Neg. HR HPV:neg MMG:  10/09/14 BIRADS1:neg Colonoscopy:  10/27/2005 Normal Dr. Collene Mares TDaP:  05/13/2011 Hep C and HIV: 10/31/14 Neg Labs: PCP   reports that she quit smoking about 19 years ago. Her smoking use included Cigarettes. She has a 25 pack-year smoking history. She has never used smokeless tobacco. She reports that she drinks about 1.8 oz of alcohol per week. She reports that she does not use illicit drugs.  Past Medical History  Diagnosis Date  . DEPRESSION   . GERD   . HYPERLIPIDEMIA   . MYALGIA   . BIPOLAR DISORDER UNSPECIFIED   . IRRITABLE BOWEL SYNDROME, HX OF   . Hypertension   . Atrial tachycardia ([place])   . Hypertension   . Bursitis of hip 09/1999    Bilateral - Dr. Berenice Primas  . Shortness of breath     Pulmonary eval 05/2002  . Mitral valve prolapse 12/17/2000    cardiolyte study 12/2000 neg  . Fistula of female genitalia 03/29/2007    enterovesical fistula, not reapired  . Anxiety   . Menopausal state 01/2012    FSH = 88.5  . Thyroid nodule 2014  . Tremors of nervous system   . Headache(784.0)     was with menstrual cycle. no longer a problem  . PE (pulmonary embolism) 02/09/2015    BILATERAL    Past Surgical History  Procedure Laterality Date  . Lipoma (r) side  1990    Right back  . Colonoscopy      Current Outpatient Prescriptions  Medication Sig Dispense Refill  . ALPRAZolam (XANAX) 0.5 MG tablet Take 0.25-0.5 mg  by mouth at bedtime as needed for sleep.    . Cholecalciferol (VITAMIN D3) 1000 UNITS CAPS Take 1,000 Units by mouth at bedtime.     Marland Kitchen diltiazem (CARTIA XT) 180 MG 24 hr capsule TAKE 1 CAPSULE (180 MG TOTAL) BY MOUTH DAILY. 90 capsule 1  . divalproex (DEPAKOTE) 500 MG 24 hr tablet Take 1,000 mg by mouth at bedtime. Dr decreasing medication until discontinued    . doxycycline (ORACEA) 40 MG capsule TAKE 1 CAPSULE BY MOUTH DAILY. 90 capsule PRN  . esomeprazole (NEXIUM) 40 MG capsule TAKE 1 CAPSULE BY MOUTH AT BEDTIME. 90 capsule 3  . FINACEA 15 % FOAM Apply 1 application topically daily as needed (skin). 50 g 3  . fluticasone (FLONASE) 50 MCG/ACT nasal spray Place 2 sprays into the nose daily as needed. For allergies    . lidocaine (XYLOCAINE) 2 % jelly Apply topically as needed (for dysparunia). 30 mL 12  . nitrofurantoin, macrocrystal-monohydrate, (MACROBID) 100 MG capsule Take 1 capsule (100 mg total) by mouth once as needed (post coital). 30 capsule 8  . pramipexole (MIRAPEX) 0.25 MG tablet Take 1 tablet by mouth daily.  1  . Probiotic Product (PROBIOTIC DAILY PO) Take 1 tablet by mouth  daily at 6 (six) AM.    . propranolol (INDERAL) 10 MG tablet Take 20 mg by mouth daily as needed (shakiness). Reported on 05/17/2015  1  . rivaroxaban (XARELTO) 20 MG TABS tablet Take 1 tablet (20 mg total) by mouth daily with supper. 30 tablet 2  . traZODone (DESYREL) 50 MG tablet Take 1 tablet by mouth at bedtime.  0   No current facility-administered medications for this visit.    Family History  Problem Relation Age of Onset  . Lung cancer Mother   . Hypertension Mother   . Hyperlipidemia Mother   . Cancer Father     Esophageal cancer  . Hyperlipidemia Father   . Depression Father   . Colon cancer Paternal Uncle   . Sarcoidosis Other   . Testicular cancer Other   . Cancer Brother     ROS:  Pertinent items are noted in HPI.  Otherwise, a comprehensive ROS was negative.  Exam:   BP 100/78 mmHg   Pulse 86  Resp 16  Ht 5\' 9"  (1.753 m)  Wt 158 lb (71.668 kg)  BMI 23.32 kg/m2  LMP 05/11/2012 Height: 5\' 9"  (175.3 cm) Ht Readings from Last 3 Encounters:  06/05/15 5\' 9"  (1.753 m)  05/17/15 5\' 9"  (1.753 m)  03/21/15 5\' 9"  (1.753 m)    General appearance: alert, cooperative and appears stated age Head: Normocephalic, without obvious abnormality, atraumatic Neck: no adenopathy, supple, symmetrical, trachea midline and thyroid normal to inspection and palpation Lungs: clear to auscultation bilaterally Breasts: normal appearance, no masses or tenderness Heart: regular rate and rhythm Abdomen: soft, non-tender; no masses,  no organomegaly Extremities: extremities normal, atraumatic, no cyanosis or edema Skin: Skin color, texture, turgor normal. No rashes or lesions Lymph nodes: Cervical, supraclavicular, and axillary nodes normal. No abnormal inguinal nodes palpated Neurologic: Grossly normal   Pelvic: External genitalia:  no lesions              Urethra:  normal appearing urethra with no masses, tenderness or lesions              Bartholin's and Skene's: normal                 Vagina: normal appearing vagina with normal color and discharge, no lesions              Cervix: anteverted              Pap taken: Yes.   Bimanual Exam:  Uterus:  normal size, contour, position, consistency, mobility, non-tender              Adnexa: no mass, fullness, tenderness               Rectovaginal: Confirms               Anus:  normal sphincter tone, no lesions  Chaperone present: yes  A:  Well Woman with normal exam  Postmenopausal with HRT 05/2013 - 01/2015  Off HRT secondary to PE bilateral lungs Atrophic vaginitis HTN, history of small thyroid nodule Situational depression with episodes of anxiety  P:   Reviewed health and wellness pertinent to exam  Pap smear as above  Mammogram is due 09/2015  Counseled on breast self exam, mammography screening,  adequate intake of calcium and vitamin D, diet and exercise, Kegel's exercises return annually or prn  An After Visit Summary was printed and given to the patient.

## 2015-06-05 NOTE — Patient Instructions (Addendum)

## 2015-06-08 LAB — IPS PAP TEST WITH HPV

## 2015-06-08 NOTE — Progress Notes (Signed)
Encounter reviewed by Dr. Kynzleigh Bandel Amundson C. Silva.  

## 2015-06-11 DIAGNOSIS — F3181 Bipolar II disorder: Secondary | ICD-10-CM | POA: Diagnosis not present

## 2015-06-18 MED FILL — PRAMIPEXOLE 0.125 MG TABLET: 0.125 | 30 days supply | Qty: 90 | Fill #0

## 2015-06-25 MED FILL — DOXYCYCLINE IR-DR 40 MG CAP: 40 | 90 days supply | Qty: 90 | Fill #2

## 2015-06-29 MED FILL — XARELTO 20 MG TABLET: 20 | 30 days supply | Qty: 30 | Fill #1

## 2015-07-13 MED FILL — traZODone HCL 50 MG TABS: 50 | 90 days supply | Qty: 90 | Fill #0

## 2015-07-13 MED FILL — DIVALPROEX SOD ER 500 MG TA: 500 | 90 days supply | Qty: 360 | Fill #0

## 2015-07-16 ENCOUNTER — Encounter: Payer: Self-pay | Admitting: Hematology & Oncology

## 2015-07-16 ENCOUNTER — Ambulatory Visit (HOSPITAL_BASED_OUTPATIENT_CLINIC_OR_DEPARTMENT_OTHER): Payer: 59 | Admitting: Hematology & Oncology

## 2015-07-16 ENCOUNTER — Other Ambulatory Visit (HOSPITAL_BASED_OUTPATIENT_CLINIC_OR_DEPARTMENT_OTHER): Payer: 59

## 2015-07-16 ENCOUNTER — Ambulatory Visit (HOSPITAL_BASED_OUTPATIENT_CLINIC_OR_DEPARTMENT_OTHER)
Admission: RE | Admit: 2015-07-16 | Discharge: 2015-07-16 | Disposition: A | Discharge status: Home / Self Care | Payer: 59 | Source: Ambulatory Visit | Attending: Hematology & Oncology | Admitting: Hematology & Oncology

## 2015-07-16 ENCOUNTER — Encounter: Payer: Self-pay | Admitting: Nurse Practitioner

## 2015-07-16 VITALS — BP 133/52 | HR 85 | Temp 97.3°F | Resp 18 | Ht 69.0 in | Wt 165.0 lb

## 2015-07-16 DIAGNOSIS — I2699 Other pulmonary embolism without acute cor pulmonale: Secondary | ICD-10-CM | POA: Diagnosis not present

## 2015-07-16 DIAGNOSIS — I2602 Saddle embolus of pulmonary artery with acute cor pulmonale: Secondary | ICD-10-CM

## 2015-07-16 DIAGNOSIS — I2782 Chronic pulmonary embolism: Secondary | ICD-10-CM

## 2015-07-16 DIAGNOSIS — Z7901 Long term (current) use of anticoagulants: Secondary | ICD-10-CM | POA: Diagnosis not present

## 2015-07-16 DIAGNOSIS — I2601 Septic pulmonary embolism with acute cor pulmonale: Secondary | ICD-10-CM

## 2015-07-16 DIAGNOSIS — D6862 Lupus anticoagulant syndrome: Secondary | ICD-10-CM

## 2015-07-16 DIAGNOSIS — L719 Rosacea, unspecified: Secondary | ICD-10-CM

## 2015-07-16 LAB — COMPREHENSIVE METABOLIC PANEL
ALT: 11 U/L (ref 0–55)
AST: 12 U/L (ref 5–34)
Albumin: 3.8 g/dL (ref 3.5–5.0)
Alkaline Phosphatase: 52 U/L (ref 40–150)
Anion Gap: 8 mEq/L (ref 3–11)
BUN: 7.4 mg/dL (ref 7.0–26.0)
CO2: 29 mEq/L (ref 22–29)
Calcium: 9.4 mg/dL (ref 8.4–10.4)
Chloride: 104 mEq/L (ref 98–109)
Creatinine: 0.9 mg/dL (ref 0.6–1.1)
EGFR: 75 mL/min/{1.73_m2} — ABNORMAL LOW (ref >90)
Glucose: 87 mg/dl (ref 70–140)
Potassium: 3.8 mEq/L (ref 3.5–5.1)
Sodium: 142 mEq/L (ref 136–145)
Total Bilirubin: 0.35 mg/dL (ref 0.20–1.20)
Total Protein: 7.5 g/dL (ref 6.4–8.3)

## 2015-07-16 LAB — CBC WITH DIFFERENTIAL (CANCER CENTER ONLY)
BASO#: 0 10*3/uL (ref 0.0–0.2)
BASO%: 0.4 % (ref 0.0–2.0)
EOS%: 1 % (ref 0.0–7.0)
Eosinophils Absolute: 0.1 10*3/uL (ref 0.0–0.5)
HCT: 42.1 % (ref 34.8–46.6)
HGB: 14.1 g/dL (ref 11.6–15.9)
LYMPH#: 2.6 10*3/uL (ref 0.9–3.3)
LYMPH%: 38.9 % (ref 14.0–48.0)
MCH: 30.3 pg (ref 26.0–34.0)
MCHC: 33.5 g/dL (ref 32.0–36.0)
MCV: 90 fL (ref 81–101)
MONO#: 0.5 10*3/uL (ref 0.1–0.9)
MONO%: 7.5 % (ref 0.0–13.0)
NEUT#: 3.5 10*3/uL (ref 1.5–6.5)
NEUT%: 52.2 % (ref 39.6–80.0)
Platelets: 198 10*3/uL (ref 145–400)
RBC: 4.66 10*6/uL (ref 3.70–5.32)
RDW: 13.3 % (ref 11.1–15.7)
WBC: 6.7 10*3/uL (ref 3.9–10.0)

## 2015-07-16 NOTE — Progress Notes (Signed)
Hematology and Oncology Follow Up Visit  Beverly Cole [id number] March 15, 1960 55 y.o. 07/16/2015   Principle Diagnosis:   Bilateral pulmonary embolism  Positive lupus anticoagulant-transient  Mildly depressed protein S level  Current Therapy:    Xarelto 20 mg by mouth daily-finished 1 year therapy in December 2017     Interim History:  Beverly Cole is back for follow-up. We first saw her back in February. That point time, we did a hypercoagulable study on her. Beverly Cole was found have a positive lupus anticoagulant. This will be rechecked when we see her  back.  Beverly Cole also is found a slightly depressed functional protein S level. This was 59%. I'm not sure what this indicates.  Beverly Cole is doing well with the Xarelto. We did do Dopplers of her leg today. Dopplers of her legs did not show any thrombi.  Beverly Cole saws of some slight chest wall discomfort. This seems to be on her left side. There is no obvious shortness of breath. Beverly Cole does use an incentive spirometer. I told her to use this 5 or 6 times a day.  Beverly Cole has had no bleeding. There is no change in bowel or bladder habits.  Beverly Cole's had no rashes. Beverly Cole does have some rosacea on her face. Beverly Cole wants to see a dermatologist. We will see about making a referral.  Beverly Cole will be going for some cool sculpting of her abdomen. I guess this is a new way of decreasing abdominal fat deposition. I will see any problems with her having this as no invasive measures were done with this procedure.  Currently, her performance status is ECOG 0.  Medications:  Current outpatient prescriptions:  .  ALPRAZolam (XANAX) 0.5 MG tablet, Take 0.25-0.5 mg by mouth at bedtime as needed for sleep., Disp: , Rfl:  .  Cholecalciferol (VITAMIN D3) 1000 UNITS CAPS, Take 1,000 Units by mouth at bedtime. , Disp: , Rfl:  .  diltiazem (CARTIA XT) 180 MG 24 hr capsule, TAKE 1 CAPSULE (180 MG TOTAL) BY MOUTH DAILY., Disp: 90 capsule, Rfl: 1 .  divalproex (DEPAKOTE) 500 MG 24 hr tablet, Take  1,000 mg by mouth at bedtime. Dr decreasing medication until discontinued, Disp: , Rfl:  .  doxycycline (ORACEA) 40 MG capsule, TAKE 1 CAPSULE BY MOUTH DAILY., Disp: 90 capsule, Rfl: PRN .  esomeprazole (NEXIUM) 40 MG capsule, TAKE 1 CAPSULE BY MOUTH AT BEDTIME., Disp: 90 capsule, Rfl: 3 .  FINACEA 15 % FOAM, Apply 1 application topically daily as needed (skin)., Disp: 50 g, Rfl: 3 .  fluticasone (FLONASE) 50 MCG/ACT nasal spray, Place 2 sprays into the nose daily as needed. For allergies, Disp: , Rfl:  .  lidocaine (XYLOCAINE) 2 % jelly, Apply topically as needed (for dysparunia)., Disp: 30 mL, Rfl: 12 .  nitrofurantoin, macrocrystal-monohydrate, (MACROBID) 100 MG capsule, Take 1 capsule (100 mg total) by mouth once as needed (post coital)., Disp: 30 capsule, Rfl: 8 .  pramipexole (MIRAPEX) 0.125 MG tablet, 0.125 mg. TAKE 3 TABS DAILY, Disp: , Rfl: 0 .  Probiotic Product (PROBIOTIC DAILY PO), Take 1 tablet by mouth daily at 6 (six) AM., Disp: , Rfl:  .  propranolol (INDERAL) 10 MG tablet, Take 20 mg by mouth daily as needed (shakiness). Reported on 05/17/2015, Disp: , Rfl: 1 .  rivaroxaban (XARELTO) 20 MG TABS tablet, Take 1 tablet (20 mg total) by mouth daily with supper., Disp: 30 tablet, Rfl: 2 .  traZODone (DESYREL) 50 MG tablet, Take 1 tablet by mouth at bedtime.,  Disp: , Rfl: 0  Allergies:  Allergies  Allergen Reactions  . Cariprazine Other (See Comments)    Patient becomes suicidal when taking this medication.  Also causes tremors. Patient feels that the reactions were due to the dosing being too high.    Marland Kitchen Lopressor [Metoprolol]     Hallucinations hair falls out  . Nickel Rash    Past Medical History, Surgical history, Social history, and Family History were reviewed and updated.  Review of Systems: As above  Physical Exam:  height is 5\' 9"  (1.753 m) and weight is 165 lb (74.844 kg). Her oral temperature is 97.3 F (36.3 C). Her blood pressure is 133/52 and her pulse is 85. Her  respiration is 18.   Wt Readings from Last 3 Encounters:  07/16/15 165 lb (74.844 kg)  06/05/15 158 lb (71.668 kg)  05/17/15 157 lb 12.8 oz (71.578 kg)     Well-developed and well-nourished white female in no obvious distress. Head exam shows no ocular or oral lesions. Beverly Cole has no palpable cervical or supraclavicular lymph nodes. Lungs are clear bilaterally. Cardiac exam regular rate and rhythm with no murmurs, rubs or bruits. Abdomen is soft. Beverly Cole has good bowel sounds. There is no fluid wave. There is no palpable liver or spleen tip. Back exam shows no tenderness over the spine, ribs or hips. Extremities shows no clubbing, cyanosis or edema. No venous cords noted in her legs. Beverly Cole is a negative Homans sign bilaterally. Skin exam shows no rashes, ecchymoses or petechia.  Lab Results  Component Value Date   WBC 6.7 07/16/2015   HGB 14.1 07/16/2015   HCT 42.1 07/16/2015   MCV 90 07/16/2015   PLT 198 07/16/2015     Chemistry      Component Value Date/Time   NA 142 07/16/2015 1132   NA 136 03/21/2015 1453   NA 141 02/14/2015 1155   K 3.8 07/16/2015 1132   K 4.0 03/21/2015 1453   K 3.6 02/14/2015 1155   CL 103 03/21/2015 1453   CL 108 02/14/2015 1155   CO2 29 07/16/2015 1132   CO2 24 03/21/2015 1453   CO2 23 02/14/2015 1155   BUN 7.4 07/16/2015 1132   BUN 10 03/21/2015 1453   BUN 13 02/14/2015 1155   CREATININE 0.9 07/16/2015 1132   CREATININE 0.83 03/21/2015 1453   CREATININE 1.06* 02/14/2015 1155      Component Value Date/Time   CALCIUM 9.4 07/16/2015 1132   CALCIUM 9.5 03/21/2015 1453   CALCIUM 9.6 02/14/2015 1155   ALKPHOS 52 07/16/2015 1132   ALKPHOS 50 03/21/2015 1453   ALKPHOS 42 02/10/2015 0300   AST 12 07/16/2015 1132   AST 13 03/21/2015 1453   AST 13* 02/10/2015 0300   ALT 11 07/16/2015 1132   ALT 10 03/21/2015 1453   ALT 9* 02/10/2015 0300   BILITOT 0.35 07/16/2015 1132   BILITOT 0.3 03/21/2015 1453   BILITOT 0.5 02/10/2015 0300         Impression and  Plan: Beverly Cole is a 55 year old white female. Beverly Cole has a pulmonary embolism. This is bilateral pulmonary embolism. Beverly Cole had been on hormonal replacement therapy.  I'm not sure if her hypercoagulable studies are significant for a thrombophilic state. We will have to repeat these when we see her back.  If her lupus anticoagulant is positive, then would manage get her on aspirin.  Otherwise, I think one year of 4 dose anticoagulation would be appropriate and then 1 year of low-dose anticoagulation  would be considered appropriate.  I spent about 25 minutes with Beverly Cole and her husband. It is always nice talking to them.   Volanda Napoleon, MD 6/5/20171:28 PM

## 2015-07-16 NOTE — Progress Notes (Signed)
Referral made with Dr. Allyson Sabal as ordered. Appointment has been made for 08/13/15 @ 1050. Pt is aware.

## 2015-07-17 DIAGNOSIS — F3181 Bipolar II disorder: Secondary | ICD-10-CM | POA: Diagnosis not present

## 2015-07-18 ENCOUNTER — Ambulatory Visit (INDEPENDENT_AMBULATORY_CARE_PROVIDER_SITE_OTHER): Payer: 59 | Admitting: Licensed Clinical Social Worker

## 2015-07-18 DIAGNOSIS — F064 Anxiety disorder due to known physiological condition: Secondary | ICD-10-CM | POA: Diagnosis not present

## 2015-07-18 DIAGNOSIS — F332 Major depressive disorder, recurrent severe without psychotic features: Secondary | ICD-10-CM

## 2015-07-19 MED FILL — PRAMIPEXOLE 0.25 MG TABLET: 0.25 | 30 days supply | Qty: 45 | Fill #0

## 2015-07-19 MED FILL — CARTIA XT 180 MG CAPSULE SA: 180 | 90 days supply | Qty: 90 | Fill #1

## 2015-07-30 MED FILL — XARELTO 20 MG TABLET: 20 | 30 days supply | Qty: 30 | Fill #2

## 2015-08-07 ENCOUNTER — Ambulatory Visit (INDEPENDENT_AMBULATORY_CARE_PROVIDER_SITE_OTHER): Payer: 59 | Admitting: Licensed Clinical Social Worker

## 2015-08-07 DIAGNOSIS — F064 Anxiety disorder due to known physiological condition: Secondary | ICD-10-CM | POA: Diagnosis not present

## 2015-08-07 DIAGNOSIS — F332 Major depressive disorder, recurrent severe without psychotic features: Secondary | ICD-10-CM | POA: Diagnosis not present

## 2015-08-13 DIAGNOSIS — D235 Other benign neoplasm of skin of trunk: Secondary | ICD-10-CM | POA: Diagnosis not present

## 2015-08-13 DIAGNOSIS — D1801 Hemangioma of skin and subcutaneous tissue: Secondary | ICD-10-CM | POA: Diagnosis not present

## 2015-08-13 DIAGNOSIS — L814 Other melanin hyperpigmentation: Secondary | ICD-10-CM | POA: Diagnosis not present

## 2015-08-15 ENCOUNTER — Encounter: Payer: Self-pay | Admitting: Gastroenterology

## 2015-08-17 ENCOUNTER — Encounter (HOSPITAL_COMMUNITY): Payer: Self-pay

## 2015-08-17 ENCOUNTER — Emergency Department (HOSPITAL_COMMUNITY): Payer: 59

## 2015-08-17 ENCOUNTER — Emergency Department (HOSPITAL_COMMUNITY)
Admission: EM | Admit: 2015-08-17 | Discharge: 2015-08-17 | Disposition: A | Discharge status: Home / Self Care | Payer: 59 | Attending: Emergency Medicine | Admitting: Emergency Medicine

## 2015-08-17 DIAGNOSIS — Z79899 Other long term (current) drug therapy: Secondary | ICD-10-CM | POA: Diagnosis not present

## 2015-08-17 DIAGNOSIS — Z87891 Personal history of nicotine dependence: Secondary | ICD-10-CM | POA: Insufficient documentation

## 2015-08-17 DIAGNOSIS — Z7901 Long term (current) use of anticoagulants: Secondary | ICD-10-CM | POA: Insufficient documentation

## 2015-08-17 DIAGNOSIS — R0789 Other chest pain: Secondary | ICD-10-CM | POA: Diagnosis not present

## 2015-08-17 DIAGNOSIS — I10 Essential (primary) hypertension: Secondary | ICD-10-CM | POA: Insufficient documentation

## 2015-08-17 DIAGNOSIS — R079 Chest pain, unspecified: Secondary | ICD-10-CM | POA: Diagnosis not present

## 2015-08-17 DIAGNOSIS — R0602 Shortness of breath: Secondary | ICD-10-CM | POA: Diagnosis not present

## 2015-08-17 LAB — BASIC METABOLIC PANEL
Anion gap: 9 (ref 5–15)
BUN: 13 mg/dL (ref 6–20)
CO2: 22 mmol/L (ref 22–32)
Calcium: 9.2 mg/dL (ref 8.9–10.3)
Chloride: 108 mmol/L (ref 101–111)
Creatinine, Ser: 0.84 mg/dL (ref 0.44–1.00)
GFR calc Af Amer: >60 mL/min (ref >60)
GFR calc non Af Amer: >60 mL/min (ref >60)
Glucose, Bld: 116 mg/dL — ABNORMAL HIGH (ref 65–99)
Potassium: 4 mmol/L (ref 3.5–5.1)
Sodium: 139 mmol/L (ref 135–145)

## 2015-08-17 LAB — CBC
HCT: 41 % (ref 36.0–46.0)
Hemoglobin: 13.2 g/dL (ref 12.0–15.0)
MCH: 28.5 pg (ref 26.0–34.0)
MCHC: 32.2 g/dL (ref 30.0–36.0)
MCV: 88.6 fL (ref 78.0–100.0)
Platelets: 194 10*3/uL (ref 150–400)
RBC: 4.63 MIL/uL (ref 3.87–5.11)
RDW: 13.5 % (ref 11.5–15.5)
WBC: 6.9 10*3/uL (ref 4.0–10.5)

## 2015-08-17 LAB — I-STAT TROPONIN, ED
Troponin i, poc: 0 ng/mL (ref 0.00–0.08)
Troponin i, poc: 0 ng/mL (ref 0.00–0.08)

## 2015-08-17 NOTE — Discharge Instructions (Signed)
1. Medications: usual home medications 2. Treatment: rest, drink plenty of fluids,  3. Follow Up: Please followup with your primary doctor in 2 days for discussion of your diagnoses and further evaluation after today's visit; Please also make an appointment with cardiology; Please return to the ER for return of symptoms or other concerns

## 2015-08-17 NOTE — ED Notes (Addendum)
Pt. BIB [facility] for evaluation of L sided crushing CP. Pt. Denies radiation of pain, states pain is intermittent. Pt. States at the time she was also SOB with diaphoresis and lightheadedness. Pt. With hx of PE, states this feels different. Pt on elliquis. Pt. Given 324 ASA in route.

## 2015-08-17 NOTE — ED Provider Notes (Signed)
CSN: [id number]     Arrival date & time 08/17/15  1757 History   First MD Initiated Contact with Patient 08/17/15 1916     Chief Complaint  Patient presents with  . Chest Pain     (Consider location/radiation/quality/duration/timing/severity/associated sxs/prior Treatment) The history is provided by the patient, the spouse and medical records. No language interpreter was used.     Beverly Cole is a 55 y.o. female  with a hx of HTN, hyperlipidemia, PE (on Xarelto - no missed doses), mitral valve prolapse presents to the Emergency Department complaining of sudden onset intermittent chest pain onset 4pm today.  Pt reports her CP resolved around 8pm.  Associated symptoms include diaphoresis, lightheadedness, nausea.  Pt was at the grocery store and denies any exertion.  No known aggravating or alleviating factors.  Pt denies fever, chills, headache, neck pain, SOB, abd pain, vomiting, diarrhea, weakness, syncope, dysuria.  No recent illness.  Denies personal or family hx of cardiac disease.     Past Medical History  Diagnosis Date  . DEPRESSION   . GERD   . HYPERLIPIDEMIA   . MYALGIA   . BIPOLAR DISORDER UNSPECIFIED   . IRRITABLE BOWEL SYNDROME, HX OF   . Hypertension   . Atrial tachycardia ([place])   . Hypertension   . Bursitis of hip 09/1999    Bilateral - Dr. Berenice Primas  . Shortness of breath     Pulmonary eval 05/2002  . Mitral valve prolapse 12/17/2000    cardiolyte study 12/2000 neg  . Fistula of female genitalia 03/29/2007    enterovesical fistula, not reapired  . Anxiety   . Menopausal state 01/2012    FSH = 88.5  . Thyroid nodule 2014  . Tremors of nervous system   . Headache(784.0)     was with menstrual cycle. no longer a problem  . PE (pulmonary embolism) 02/09/2015    BILATERAL   Past Surgical History  Procedure Laterality Date  . Lipoma (r) side  1990    Right back  . Colonoscopy     Family History  Problem Relation Age of Onset  . Lung cancer Mother   .  Hypertension Mother   . Hyperlipidemia Mother   . Cancer Father     Esophageal cancer  . Hyperlipidemia Father   . Depression Father   . Colon cancer Paternal Uncle   . Sarcoidosis Other   . Testicular cancer Other   . Cancer Brother    Social History  Substance Use Topics  . Smoking status: Former Smoker -- 1.00 packs/day for 25 years    Types: Cigarettes    Quit date: 02/11/1996  . Smokeless tobacco: Never Used     Comment: Married, lives with spouse. 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 Alcohol Use: 1.8 oz/week    3 Standard drinks or equivalent per week   OB History    Gravida Para Term Preterm AB TAB SAB Ectopic Multiple Living   0 0             Review of Systems  Constitutional: Positive for diaphoresis. Negative for fever, appetite change, fatigue and unexpected weight change.  HENT: Negative for mouth sores.   Eyes: Negative for visual disturbance.  Respiratory: Negative for cough, chest tightness, shortness of breath and wheezing.   Cardiovascular: Positive for chest pain.  Gastrointestinal: Negative for nausea, vomiting, abdominal pain, diarrhea and constipation.  Endocrine: Negative for polydipsia, polyphagia and polyuria.  Genitourinary: Negative for dysuria,  urgency, frequency and hematuria.  Musculoskeletal: Negative for back pain and neck stiffness.  Skin: Negative for rash.  Allergic/Immunologic: Negative for immunocompromised state.  Neurological: Positive for light-headedness. Negative for syncope and headaches.  Hematological: Does not bruise/bleed easily.  Psychiatric/Behavioral: Negative for sleep disturbance. The patient is not nervous/anxious.       Allergies  Cariprazine; Lopressor; and Nickel  Home Medications   Prior to Admission medications   Medication Sig Start Date End Date Taking? Authorizing Provider  ALPRAZolam Duanne Moron) 0.5 MG tablet Take 0.25-0.5 mg by mouth at bedtime as needed for sleep.   Yes Historical Provider, MD   BIOTIN PO Take 1 tablet by mouth daily.   Yes Historical Provider, MD  Cholecalciferol (VITAMIN D3) 1000 UNITS CAPS Take 1,000 Units by mouth at bedtime.    Yes Historical Provider, MD  diltiazem (CARTIA XT) 180 MG 24 hr capsule TAKE 1 CAPSULE (180 MG TOTAL) BY MOUTH DAILY. 02/08/15  Yes Hayslee R Lowne Chase, DO  divalproex (DEPAKOTE) 500 MG 24 hr tablet Take 1,000 mg by mouth every morning.    Yes Historical Provider, MD  doxycycline (ORACEA) 40 MG capsule TAKE 1 CAPSULE BY MOUTH DAILY. 07/21/14  Yes Rayan R Lowne Chase, DO  esomeprazole (NEXIUM) 40 MG capsule TAKE 1 CAPSULE BY MOUTH AT BEDTIME. Patient taking differently: TAKE 1 CAPSULE BY MOUTH IN MORNING 11/27/14  Yes Caleesi R Lowne Chase, DO  FINACEA 15 % FOAM Apply 1 application topically daily as needed (skin). 02/08/15  Yes Maurice R Lowne Chase, DO  fluticasone (FLONASE) 50 MCG/ACT nasal spray Place 2 sprays into the nose daily as needed. For allergies   Yes Historical Provider, MD  nitrofurantoin, macrocrystal-monohydrate, (MACROBID) 100 MG capsule Take 1 capsule (100 mg total) by mouth once as needed (post coital). 06/05/15  Yes Kem Boroughs, FNP  pramipexole (MIRAPEX) 0.125 MG tablet Take 0.375 mg by mouth daily. TAKE 3 TABS DAILY 06/18/15  Yes Historical Provider, MD  Probiotic Product (PROBIOTIC DAILY PO) Take 1 tablet by mouth daily.    Yes Historical Provider, MD  propranolol (INDERAL) 10 MG tablet Take 20 mg by mouth daily as needed (shakiness). Reported on 05/17/2015 11/21/14  Yes Historical Provider, MD  rivaroxaban (XARELTO) 20 MG TABS tablet Take 1 tablet (20 mg total) by mouth daily with supper. 05/17/15  Yes Joscelynn R Lowne Chase, DO  traZODone (DESYREL) 50 MG tablet Take 1 tablet by mouth at bedtime. 04/23/15  Yes Historical Provider, MD  lidocaine (XYLOCAINE) 2 % jelly Apply topically as needed (for dysparunia). 06/05/15   Kem Boroughs, FNP   BP 129/84 mmHg  Pulse 80  Temp(Src) 98.5 F (36.9 C) (Oral)  Resp 17  Ht 5\' 9"   (1.753 m)  Wt 74.844 kg  BMI 24.36 kg/m2  SpO2 97%  LMP 05/11/2012 Physical Exam  Constitutional: She appears well-developed and well-nourished. No distress.  Awake, alert, nontoxic appearance  HENT:  Head: Normocephalic and atraumatic.  Mouth/Throat: Oropharynx is clear and moist. No oropharyngeal exudate.  Eyes: Conjunctivae are normal. No scleral icterus.  Neck: Normal range of motion. Neck supple.  Cardiovascular: Normal rate, regular rhythm, normal heart sounds and intact distal pulses.   Pulmonary/Chest: Effort normal and breath sounds normal. No respiratory distress. She has no wheezes.  Equal chest expansion  Abdominal: Soft. Bowel sounds are normal. She exhibits no mass. There is no tenderness. There is no rebound and no guarding.  Musculoskeletal: Normal range of motion. She exhibits no edema.  Neurological: She is alert.  Speech is clear and goal oriented Moves extremities without ataxia  Skin: Skin is warm and dry. She is not diaphoretic.  Psychiatric: She has a normal mood and affect.  Nursing note and vitals reviewed.   ED Course  Procedures (including critical care time) Labs Review Labs Reviewed  BASIC METABOLIC PANEL - Abnormal; Notable for the following:    Glucose, Bld 116 (*)    All other components within normal limits  CBC  I-STAT TROPOININ, ED  Randolm Idol, ED    Imaging Review Dg Chest 2 View  08/17/2015  CLINICAL DATA:  Shortness of breath and left-sided chest pain beginning today. Previous history of pulmonary emboli. EXAM: CHEST  2 VIEW COMPARISON:  02/14/2015 FINDINGS: Heart size is normal. Mediastinal shadows are normal. The lungs are clear. No effusions. No bony abnormalities. IMPRESSION: Normal chest Electronically Signed   By: Nelson Chimes M.D.   On: 08/17/2015 19:24   I have personally reviewed and evaluated these images and lab results as part of my medical decision-making.   EKG Interpretation   Date/Time:  Friday August 17 2015  18:03:01 EDT Ventricular Rate:  78 PR Interval:    QRS Duration: 75 QT Interval:  361 QTC Calculation: 412 R Axis:   64 Text Interpretation:  Sinus rhythm Multiple ventricular premature  complexes Borderline T wave abnormalities Baseline wander in lead(s) II  III aVL aVF No significant change since last tracing Confirmed by LITTLE  MD, RACHEL [phone]) on 08/17/2015 8:44:37 PM      EKG Interpretation  Date/Time:  Friday August 17 2015 22:29:10 EDT Ventricular Rate:  80 PR Interval:    QRS Duration: 91 QT Interval:  441 QTC Calculation: 509 R Axis:   53 Text Interpretation:  Sinus rhythm Nonspecific T abnormalities, diffuse leads Borderline prolonged QT interval No significant change since last tracing Confirmed by LITTLE MD, RACHEL [id number]) on 08/17/2015 10:34:23 PM        MDM   Final diagnoses:  Chest pain, unspecified chest pain type   [name] presents with sudden onset chest pain, now resolved.  She reports the pain did return when she was asked to take a deep breath on lung exam, but again resolved when she returned to normal breathing.    10:24 PM Pt remains CP free.  Delta troponin negative.  Pt denies persistence of other symptoms.   Pt with stable vital signs.  No tracheal deviation, no JVD or new murmur, RRR, breath sounds equal bilaterally, EKG without acute abnormalities, negative troponin, and negative CXR. Repeat ECG unchanged.  Doubt ACS.  No evidence of PNA.  Pt offered admission and discussed risk vs benefit of admission vs discharge home and pt continues to request discharge.  Heart score 4.    Pt has been advised to return to the ED if CP returns, becomes exertional, associated with diaphoresis or nausea, radiates to left jaw/arm, worsens or becomes concerning in any way. Pt appears reliable for follow up and is agreeable to discharge.   Case has been discussed with and seen by Dr. Rex Kras who agrees with the above plan to discharge.    Jarrett Soho Kealey Kemmer,  PA-C 08/17/15 [place], MD 08/19/15 1455

## 2015-08-20 ENCOUNTER — Ambulatory Visit (INDEPENDENT_AMBULATORY_CARE_PROVIDER_SITE_OTHER): Payer: 59 | Admitting: Licensed Clinical Social Worker

## 2015-08-20 DIAGNOSIS — F332 Major depressive disorder, recurrent severe without psychotic features: Secondary | ICD-10-CM

## 2015-08-20 DIAGNOSIS — F064 Anxiety disorder due to known physiological condition: Secondary | ICD-10-CM | POA: Diagnosis not present

## 2015-08-21 DIAGNOSIS — F3181 Bipolar II disorder: Secondary | ICD-10-CM | POA: Diagnosis not present

## 2015-08-22 MED FILL — PRAMIPEXOLE 0.25 MG TABLET: 0.25 | 90 days supply | Qty: 135 | Fill #0

## 2015-08-22 MED FILL — ALPRAZolam 0.25 MG TABS: 0.25 | 15 days supply | Qty: 60 | Fill #0

## 2015-08-27 ENCOUNTER — Ambulatory Visit (INDEPENDENT_AMBULATORY_CARE_PROVIDER_SITE_OTHER): Payer: 59 | Admitting: Licensed Clinical Social Worker

## 2015-08-27 DIAGNOSIS — F332 Major depressive disorder, recurrent severe without psychotic features: Secondary | ICD-10-CM | POA: Diagnosis not present

## 2015-08-29 ENCOUNTER — Telehealth: Payer: Self-pay | Admitting: Family Medicine

## 2015-08-29 MED FILL — ESOMEPRAZOLE MAG DR 40 MG C: 40 | 90 days supply | Qty: 90 | Fill #3

## 2015-08-29 NOTE — Telephone Encounter (Signed)
Attempted to contact patient to reschedule appointment with Dr. Carollee Herter on August 31. Left message for patient to call the office to reschedule. Appointment cancelled

## 2015-08-31 ENCOUNTER — Encounter: Payer: Self-pay | Admitting: Family Medicine

## 2015-08-31 ENCOUNTER — Ambulatory Visit (INDEPENDENT_AMBULATORY_CARE_PROVIDER_SITE_OTHER): Payer: 59 | Admitting: Family Medicine

## 2015-08-31 VITALS — BP 120/78 | HR 78 | Temp 98.1°F | Ht 69.0 in | Wt 164.4 lb

## 2015-08-31 DIAGNOSIS — R0789 Other chest pain: Secondary | ICD-10-CM | POA: Diagnosis not present

## 2015-08-31 DIAGNOSIS — F3181 Bipolar II disorder: Secondary | ICD-10-CM

## 2015-08-31 DIAGNOSIS — Z0000 Encounter for general adult medical examination without abnormal findings: Secondary | ICD-10-CM

## 2015-08-31 NOTE — Assessment & Plan Note (Addendum)
Pt has not had any more chest pain since ER She has an appointment with cardio next week D/w pt going back to er if chest pain returns Er visit reviewed

## 2015-08-31 NOTE — Progress Notes (Signed)
Pre visit review using our clinic review tool, if applicable. No additional management support is needed unless otherwise documented below in the visit note. 

## 2015-08-31 NOTE — Progress Notes (Signed)
Patient ID: Beverly Cole, female    DOB: [birth year]  Age: 55 y.o. MRN: [id number]    Subjective:  Subjective HPI GRISELA KIDNEY presents for f/u from Er for chest pain.  Er records reviewed.  Pt has not had any chest pain since er visit.  She sees cardiology next Friday.  Pt has been under a lot of stress -- trying to get disability.  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    Review of Systems  Constitutional: Negative for fever, chills, diaphoresis, activity change, appetite change, fatigue and unexpected weight change.  Eyes: Negative for pain, redness and visual disturbance.  Respiratory: Negative for cough, chest tightness, shortness of breath and wheezing.   Cardiovascular: Negative for chest pain, palpitations and leg swelling.  Gastrointestinal: Negative for abdominal pain and abdominal distention.  Endocrine: Negative for cold intolerance, heat intolerance, polydipsia, polyphagia and polyuria.  Genitourinary: Negative for dysuria, frequency, hematuria, flank pain, vaginal discharge, difficulty urinating, genital sores, vaginal pain, menstrual problem, pelvic pain and dyspareunia.  Musculoskeletal: Negative for back pain.  Neurological: Negative for dizziness, light-headedness, numbness and headaches.  Psychiatric/Behavioral: Positive for dysphoric mood and decreased concentration.    History Past Medical History  Diagnosis Date  . DEPRESSION   . GERD   . HYPERLIPIDEMIA   . MYALGIA   . BIPOLAR DISORDER UNSPECIFIED   . IRRITABLE BOWEL SYNDROME, HX OF   . Hypertension   . Atrial tachycardia ([place])   . Hypertension   . Bursitis of hip 09/1999    Bilateral - Dr. Berenice Primas  . Shortness of breath     Pulmonary eval 05/2002  . Mitral valve prolapse 12/17/2000    cardiolyte study 12/2000 neg  . Fistula of female genitalia 03/29/2007    enterovesical fistula, not reapired  . Anxiety   . Menopausal state 01/2012    FSH = 88.5  . Thyroid nodule 2014  . Tremors of nervous  system   . Headache(784.0)     was with menstrual cycle. no longer a problem  . PE (pulmonary embolism) 02/09/2015    BILATERAL    She has past surgical history that includes Lipoma (R) side (1990) and Colonoscopy.   Her family history includes Cancer in her brother and father; Colon cancer in her paternal uncle; Depression in her father; Hyperlipidemia in her father and mother; Hypertension in her mother; Lung cancer in her mother; Sarcoidosis in her other; Testicular cancer in her other.She reports that she quit smoking about 19 years ago. Her smoking use included Cigarettes. She has a 25 pack-year smoking history. She has never used smokeless tobacco. She reports that she drinks about 1.8 oz of alcohol per week. She reports that she does not use illicit drugs.  Current Outpatient Prescriptions on File Prior to Visit  Medication Sig Dispense Refill  . ALPRAZolam (XANAX) 0.5 MG tablet Take 0.25-0.5 mg by mouth at bedtime as needed for sleep.    Marland Kitchen BIOTIN PO Take 1 tablet by mouth daily.    . Cholecalciferol (VITAMIN D3) 1000 UNITS CAPS Take 1,000 Units by mouth daily.     Marland Kitchen diltiazem (CARTIA XT) 180 MG 24 hr capsule TAKE 1 CAPSULE (180 MG TOTAL) BY MOUTH DAILY. 90 capsule 1  . divalproex (DEPAKOTE) 500 MG 24 hr tablet Take 1,000 mg by mouth every morning.     Marland Kitchen doxycycline (ORACEA) 40 MG capsule TAKE 1 CAPSULE BY MOUTH DAILY. 90 capsule PRN  . esomeprazole (NEXIUM) 40 MG capsule TAKE 1 CAPSULE  BY MOUTH AT BEDTIME. (Patient taking differently: TAKE 1 CAPSULE BY MOUTH IN MORNING) 90 capsule 3  . FINACEA 15 % FOAM Apply 1 application topically daily as needed (skin). 50 g 3  . fluticasone (FLONASE) 50 MCG/ACT nasal spray Place 2 sprays into the nose daily as needed. For allergies    . lidocaine (XYLOCAINE) 2 % jelly Apply topically as needed (for dysparunia). 30 mL 12  . nitrofurantoin, macrocrystal-monohydrate, (MACROBID) 100 MG capsule Take 1 capsule (100 mg total) by mouth once as needed (post  coital). 30 capsule 8  . pramipexole (MIRAPEX) 0.125 MG tablet Take 0.375 mg by mouth daily. TAKE 3 TABS DAILY  0  . Probiotic Product (PROBIOTIC DAILY PO) Take 1 tablet by mouth daily.     . propranolol (INDERAL) 10 MG tablet Take 20 mg by mouth daily as needed (shakiness). Reported on 05/17/2015  1  . rivaroxaban (XARELTO) 20 MG TABS tablet Take 1 tablet (20 mg total) by mouth daily with supper. 30 tablet 2  . traZODone (DESYREL) 50 MG tablet Take 1 tablet by mouth at bedtime.  0   No current facility-administered medications on file prior to visit.     Objective:  Objective Physical Exam  Constitutional: She is oriented to person, place, and time. She appears well-developed and well-nourished.  HENT:  Head: Normocephalic and atraumatic.  Eyes: Conjunctivae and EOM are normal.  Neck: Normal range of motion. Neck supple. No JVD present. Carotid bruit is not present. No thyromegaly present.  Cardiovascular: Normal rate, regular rhythm and normal heart sounds.   No murmur heard. Pulmonary/Chest: Effort normal and breath sounds normal. No respiratory distress. She has no wheezes. She has no rales. She exhibits no tenderness.  Musculoskeletal: She exhibits no edema.  Neurological: She is alert and oriented to person, place, and time.  Psychiatric: She exhibits a depressed mood.  Pt sees psych and counseling for bipolar  Nursing note and vitals reviewed.  BP 120/78 mmHg  Pulse 78  Temp(Src) 98.1 F (36.7 C) (Oral)  Ht 5\' 9"  (1.753 m)  Wt 164 lb 6 oz (74.56 kg)  BMI 24.26 kg/m2  SpO2 97%  LMP 05/11/2012 Wt Readings from Last 3 Encounters:  08/31/15 164 lb 6 oz (74.56 kg)  08/17/15 165 lb (74.844 kg)  07/16/15 165 lb (74.844 kg)     Lab Results  Component Value Date   WBC 6.9 08/17/2015   HGB 13.2 08/17/2015   HCT 41.0 08/17/2015   PLT 194 08/17/2015   GLUCOSE 116* 08/17/2015   CHOL 254* 10/31/2014   TRIG 234.0* 10/31/2014   HDL 42.30 10/31/2014   LDLDIRECT 212.0  10/31/2014   LDLCALC 158* 11/02/2013   ALT 11 07/16/2015   AST 12 07/16/2015   NA 139 08/17/2015   K 4.0 08/17/2015   CL 108 08/17/2015   CREATININE 0.84 08/17/2015   BUN 13 08/17/2015   CO2 22 08/17/2015   TSH 2.37 12/15/2014   INR 1.10 02/09/2015   HGBA1C 5.3 03/20/2009   MICROALBUR 0.6 05/19/2011    Dg Chest 2 View  08/17/2015  CLINICAL DATA:  Shortness of breath and left-sided chest pain beginning today. Previous history of pulmonary emboli. EXAM: CHEST  2 VIEW COMPARISON:  02/14/2015 FINDINGS: Heart size is normal. Mediastinal shadows are normal. The lungs are clear. No effusions. No bony abnormalities. IMPRESSION: Normal chest Electronically Signed   By: Nelson Chimes M.D.   On: 08/17/2015 19:24     Assessment & Plan:  Plan I am  having Ms. Geerts maintain her divalproex, fluticasone, Vitamin D3, Probiotic Product (PROBIOTIC DAILY PO), doxycycline, esomeprazole, propranolol, FINACEA, diltiazem, ALPRAZolam, traZODone, rivaroxaban, nitrofurantoin (macrocrystal-monohydrate), lidocaine, pramipexole, and BIOTIN PO.  No orders of the defined types were placed in this encounter.    Problem List Items Addressed This Visit      Unprioritized   Bipolar 2 disorder [facility])    Per psych      Chest pain - Primary    Pt has not had any more chest pain since ER She has an appointment with cardio next week D/w pt going back to er if chest pain returns Er visit reviewed       Other Visit Diagnoses    Preventative health care        Relevant Orders    Ambulatory referral to Gastroenterology     pt to wait for colon until oct/ nov  Follow-up: Return in about 3 months (around 12/01/2015), or if symptoms worsen or fail to improve, for as scheduled.  Ann Held, DO

## 2015-08-31 NOTE — Assessment & Plan Note (Signed)
On [place]  Per hematology

## 2015-08-31 NOTE — Assessment & Plan Note (Signed)
Per psych 

## 2015-08-31 NOTE — Patient Instructions (Signed)
Nonspecific Chest Pain  °Chest pain can be caused by many different conditions. There is always a chance that your pain could be related to something serious, such as a heart attack or a blood clot in your lungs. Chest pain can also be caused by conditions that are not life-threatening. If you have chest pain, it is very important to follow up with your health care provider. °CAUSES  °Chest pain can be caused by: °· Heartburn. °· Pneumonia or bronchitis. °· Anxiety or stress. °· Inflammation around your heart (pericarditis) or lung (pleuritis or pleurisy). °· A blood clot in your lung. °· A collapsed lung (pneumothorax). It can develop suddenly on its own (spontaneous pneumothorax) or from trauma to the chest. °· Shingles infection (varicella-zoster virus). °· Heart attack. °· Damage to the bones, muscles, and cartilage that make up your chest wall. This can include: °¨ Bruised bones due to injury. °¨ Strained muscles or cartilage due to frequent or repeated coughing or overwork. °¨ Fracture to one or more ribs. °¨ Sore cartilage due to inflammation (costochondritis). °RISK FACTORS  °Risk factors for chest pain may include: °· Activities that increase your risk for trauma or injury to your chest. °· Respiratory infections or conditions that cause frequent coughing. °· Medical conditions or overeating that can cause heartburn. °· Heart disease or family history of heart disease. °· Conditions or health behaviors that increase your risk of developing a blood clot. °· Having had chicken pox (varicella zoster). °SIGNS AND SYMPTOMS °Chest pain can feel like: °· Burning or tingling on the surface of your chest or deep in your chest. °· Crushing, pressure, aching, or squeezing pain. °· Dull or sharp pain that is worse when you move, cough, or take a deep breath. °· Pain that is also felt in your back, neck, shoulder, or arm, or pain that spreads to any of these areas. °Your chest pain may come and go, or it may stay  constant. °DIAGNOSIS °Lab tests or other studies may be needed to find the cause of your pain. Your health care provider may have you take a test called an ambulatory ECG (electrocardiogram). An ECG records your heartbeat patterns at the time the test is performed. You may also have other tests, such as: °· Transthoracic echocardiogram (TTE). During echocardiography, sound waves are used to create a picture of all of the heart structures and to look at how blood flows through your heart. °· Transesophageal echocardiogram (TEE). This is a more advanced imaging test that obtains images from inside your body. It allows your health care provider to see your heart in finer detail. °· Cardiac monitoring. This allows your health care provider to monitor your heart rate and rhythm in real time. °· Holter monitor. This is a portable device that records your heartbeat and can help to diagnose abnormal heartbeats. It allows your health care provider to track your heart activity for several days, if needed. °· Stress tests. These can be done through exercise or by taking medicine that makes your heart beat more quickly. °· Blood tests. °· Imaging tests. °TREATMENT  °Your treatment depends on what is causing your chest pain. Treatment may include: °· Medicines. These may include: °¨ Acid blockers for heartburn. °¨ Anti-inflammatory medicine. °¨ Pain medicine for inflammatory conditions. °¨ Antibiotic medicine, if an infection is present. °¨ Medicines to dissolve blood clots. °¨ Medicines to treat coronary artery disease. °· Supportive care for conditions that do not require medicines. This may include: °¨ Resting. °¨ Applying heat   or cold packs to injured areas. °¨ Limiting activities until pain decreases. °HOME CARE INSTRUCTIONS °· If you were prescribed an antibiotic medicine, finish it all even if you start to feel better. °· Avoid any activities that bring on chest pain. °· Do not use any tobacco products, including  cigarettes, chewing tobacco, or electronic cigarettes. If you need help quitting, ask your health care provider. °· Do not drink alcohol. °· Take medicines only as directed by your health care provider. °· Keep all follow-up visits as directed by your health care provider. This is important. This includes any further testing if your chest pain does not go away. °· If heartburn is the cause for your chest pain, you may be told to keep your head raised (elevated) while sleeping. This reduces the chance that acid will go from your stomach into your esophagus. °· Make lifestyle changes as directed by your health care provider. These may include: °¨ Getting regular exercise. Ask your health care provider to suggest some activities that are safe for you. °¨ Eating a heart-healthy diet. A registered dietitian can help you to learn healthy eating options. °¨ Maintaining a healthy weight. °¨ Managing diabetes, if necessary. °¨ Reducing stress. °SEEK MEDICAL CARE IF: °· Your chest pain does not go away after treatment. °· You have a rash with blisters on your chest. °· You have a fever. °SEEK IMMEDIATE MEDICAL CARE IF:  °· Your chest pain is worse. °· You have an increasing cough, or you cough up blood. °· You have severe abdominal pain. °· You have severe weakness. °· You faint. °· You have chills. °· You have sudden, unexplained chest discomfort. °· You have sudden, unexplained discomfort in your arms, back, neck, or jaw. °· You have shortness of breath at any time. °· You suddenly start to sweat, or your skin gets clammy. °· You feel nauseous or you vomit. °· You suddenly feel light-headed or dizzy. °· Your heart begins to beat quickly, or it feels like it is skipping beats. °These symptoms may represent a serious problem that is an emergency. Do not wait to see if the symptoms will go away. Get medical help right away. Call your local emergency services (911 in the [place] Do not drive yourself to the hospital. °  °This  information is not intended to replace advice given to you by your health care provider. Make sure you discuss any questions you have with your health care provider. °  °Document Released: 11/06/2004 Document Revised: 02/17/2014 Document Reviewed: 09/02/2013 °[employer]vier Interactive Patient Education ©2016 [employer] ° °

## 2015-09-04 ENCOUNTER — Ambulatory Visit (INDEPENDENT_AMBULATORY_CARE_PROVIDER_SITE_OTHER): Payer: 59 | Admitting: Licensed Clinical Social Worker

## 2015-09-04 DIAGNOSIS — F064 Anxiety disorder due to known physiological condition: Secondary | ICD-10-CM

## 2015-09-04 DIAGNOSIS — F332 Major depressive disorder, recurrent severe without psychotic features: Secondary | ICD-10-CM | POA: Diagnosis not present

## 2015-09-07 ENCOUNTER — Encounter: Payer: Self-pay | Admitting: Physician Assistant

## 2015-09-07 ENCOUNTER — Ambulatory Visit (INDEPENDENT_AMBULATORY_CARE_PROVIDER_SITE_OTHER): Payer: 59 | Admitting: Physician Assistant

## 2015-09-07 ENCOUNTER — Ambulatory Visit (HOSPITAL_COMMUNITY)
Admission: RE | Admit: 2015-09-07 | Discharge: 2015-09-07 | Disposition: A | Discharge status: Home / Self Care | Payer: 59 | Source: Ambulatory Visit | Attending: Physician Assistant | Admitting: Physician Assistant

## 2015-09-07 ENCOUNTER — Telehealth: Payer: Self-pay | Admitting: Nurse Practitioner

## 2015-09-07 ENCOUNTER — Other Ambulatory Visit: Payer: Self-pay | Admitting: Family Medicine

## 2015-09-07 VITALS — BP 116/60 | HR 84 | Ht 69.0 in | Wt 168.8 lb

## 2015-09-07 DIAGNOSIS — I471 Supraventricular tachycardia: Secondary | ICD-10-CM | POA: Diagnosis not present

## 2015-09-07 DIAGNOSIS — R079 Chest pain, unspecified: Secondary | ICD-10-CM

## 2015-09-07 DIAGNOSIS — R0600 Dyspnea, unspecified: Secondary | ICD-10-CM | POA: Insufficient documentation

## 2015-09-07 DIAGNOSIS — Z86711 Personal history of pulmonary embolism: Secondary | ICD-10-CM | POA: Diagnosis not present

## 2015-09-07 DIAGNOSIS — I10 Essential (primary) hypertension: Secondary | ICD-10-CM | POA: Diagnosis not present

## 2015-09-07 DIAGNOSIS — I2699 Other pulmonary embolism without acute cor pulmonale: Secondary | ICD-10-CM

## 2015-09-07 MED ORDER — IOPAMIDOL (ISOVUE-370) INJECTION 76%
100.0000 mL | Freq: Once | INTRAVENOUS | Status: AC | PRN
  Administered 2015-09-07: 100 mL via INTRAVENOUS

## 2015-09-07 MED FILL — DOXYCYCLINE IR-DR 40 MG CAP: 40 | 90 days supply | Qty: 90 | Fill #0

## 2015-09-07 MED FILL — XARELTO 20 MG TABLET: 20 | 30 days supply | Qty: 30 | Fill #0

## 2015-09-07 NOTE — Progress Notes (Signed)
Cardiology Office Note    Date:  09/07/2015  ID:  Beverly Cole, DOB 1960-08-06, MRN [id number] PCP:  Ann Held, DO  Cardiologist:  Dr. Angelena Form  Chief Complaint: chest pain  History of Present Illness:  Beverly Cole is a 55 y.o. female with history of HLD, HTN, mitral valve prolapse, PACs/bradycardia/SVT/short run of atrial fib by event monitor, bipolar disorder, IBS, bilateral PE 01/2015 (followed by heme-onc), GERD who presents for f/u of chest pain. She was diagnosed with MVP in the 1980s. She was evaluated for CP in 2013 with cardiac CT at that time showing normal coronary arteries. She also c/o palpitations and 48-hour monitor showed NSR with bradycardia, PACs and runs of SVT with short run of atrial fibrillation. She was seen in EP clinic by Dr. Rayann Heman October 2013 and he felt that this was most likely a long RP tachycardia. He did not recommend a change in therapy but suggested Flecainide if symptoms worsened and EP f/u only if there were changes. She was last seen in EP clinic in January 2014. She felt that her Toprol was causing hallucinations so she was started on Cardizem and Toprol stopped November 2014. Echo 10/2011 showed EF 55-60%, grade 2 DD, mild MR. With regard to her saddle PE she was found to have positive lupus anticoagulant and mildly depressed protein S level. Dr. Marin Olp plans to recheck her lupus anticoagulant at a later date. He is not certain if her hypercoagulable studies are significant for a thrombophilic state.   She was seen recently in the ER 08/17/15 with complaints of chest pain. She had been at [employer] when she suddenly developed chest pain across her chest. She felt nauseated and sweaty. The pain would come and go and was associated with SOB. She presented to the ER where troponin neg x2. BMET with glucose 116, CBC wnl. She recalls having palpitations in the ER with lots of PVCs on the monitor. She has not had a recurrent episode like the other  day but has noticed chest discomfort when she lays on her left side as well as sensation of SOB when drinking water. She has not had further palpitations (states diltiazem has been wonderful for them). We ambulated her in clinic today and HR increased to max of 114 with pulse ox falling only slightly from 97 to 96%. She had associated SOB with exertion. She said she wonders if part of her HR increase was because she saw another patient wearing a shirt that said "[name] is still my president" and she became emotional because she does not like the current President Trump.   Interestingly before her diagnosis of PE, she had had symptoms for up to a month prior to her workup. Her d-dimer was only 0.69 at time of diagnosis, with HR of 94 and pulse ox of 99%. She had reported chest pain while lying on her left side at that time. She said she occasionally has missed a dose of Xarelto, but no more than once a month.    Past Medical History:  Diagnosis Date  . Anxiety   . Atrial fibrillation ([place])    a. Event monitor 2013 - PACs/bradycardia/SVT/short run of atrial fib by event monitor.   Marland Kitchen BIPOLAR DISORDER UNSPECIFIED   . Bradycardia   . Bursitis of hip 09/1999   Bilateral - Dr. Berenice Primas  . DEPRESSION   . Fistula of female genitalia 03/29/2007   enterovesical fistula, not reapired  . GERD   .  Headache(784.0)    was with menstrual cycle. no longer a problem  . HYPERLIPIDEMIA   . Hypertension   . Hypertension   . IRRITABLE BOWEL SYNDROME, HX OF   . Menopausal state 01/2012   FSH = 88.5  . Mitral valve prolapse 12/17/2000   a. dx 1980s, most recent echo did not demonstrate this.  Marland Kitchen MYALGIA   . Normal coronary arteries    a. by cardiac CT 2013.  Marland Kitchen PAT (paroxysmal atrial tachycardia) ([place])   . PE (pulmonary embolism) 02/09/2015   a. Bilateral PEs 01/2015 when d-dimer 0.69, CP lying on left side. Followed by heme-onc - + lupus anticoagulant, mildly depressed protein S. Dr. Marin Olp is not certain if her  hypercoagulable studies are significant for a thrombophilic state.  . Premature atrial contractions   . Shortness of breath    Pulmonary eval 05/2002  . Thyroid nodule 2014  . Tremors of nervous system     Past Surgical History:  Procedure Laterality Date  . COLONOSCOPY    . Lipoma (R) side  1990   Right back    Current Medications: Current Outpatient Prescriptions  Medication Sig Dispense Refill  . ALPRAZolam (XANAX) 0.5 MG tablet Take 0.25-0.5 mg by mouth at bedtime as needed for sleep.    [place] Kitchen BIOTIN PO Take 1 tablet by mouth daily.    . Cholecalciferol (VITAMIN D3) 1000 UNITS CAPS Take 1,000 Units by mouth daily.     Marland Kitchen diltiazem (CARTIA XT) 180 MG 24 hr capsule TAKE 1 CAPSULE (180 MG TOTAL) BY MOUTH DAILY. 90 capsule 1  . divalproex (DEPAKOTE) 500 MG 24 hr tablet Take 1,000 mg by mouth every morning.     Marland Kitchen doxycycline (ORACEA) 40 MG capsule TAKE 1 CAPSULE BY MOUTH DAILY. 90 capsule PRN  . esomeprazole (NEXIUM) 40 MG capsule TAKE 1 CAPSULE BY MOUTH AT BEDTIME. (Patient taking differently: TAKE 1 CAPSULE BY MOUTH IN MORNING) 90 capsule 3  . FINACEA 15 % FOAM Apply 1 application topically daily as needed (skin). 50 g 3  . fluticasone (FLONASE) 50 MCG/ACT nasal spray Place 2 sprays into the nose daily as needed. For allergies    . lidocaine (XYLOCAINE) 2 % jelly Apply topically as needed (for dysparunia). 30 mL 12  . nitrofurantoin, macrocrystal-monohydrate, (MACROBID) 100 MG capsule Take 1 capsule (100 mg total) by mouth once as needed (post coital). 30 capsule 8  . pramipexole (MIRAPEX) 0.125 MG tablet Take 0.375 mg by mouth daily. TAKE 3 TABS DAILY  0  . Probiotic Product (PROBIOTIC DAILY PO) Take 1 tablet by mouth daily.     . propranolol (INDERAL) 10 MG tablet Take 20 mg by mouth daily as needed (shakiness). Reported on 05/17/2015  1  . rivaroxaban (XARELTO) 20 MG TABS tablet Take 1 tablet (20 mg total) by mouth daily with supper. 30 tablet 2  . traZODone (DESYREL) 50 MG tablet Take 1  tablet by mouth at bedtime.  0   No current facility-administered medications for this visit.      Allergies:   Cariprazine; Lopressor [metoprolol]; and Nickel   Social History   Social History  . Marital status: Married    Spouse name: N/A  . Number of children: 0  . Years of education: N/A   Occupational History  . 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 Smoking status: Former Smoker    Packs/day: 1.00    Years: 25.00    Types: Cigarettes    Quit date:  02/11/1996  . Smokeless tobacco: Never Used     Comment: Married, lives with spouse. 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 Alcohol use 1.8 oz/week    3 Standard drinks or equivalent per week  . Drug use: No  . Sexual activity: Yes    Partners: Male    Birth control/ protection: Post-menopausal   Other Topics Concern  . None   Social History Narrative   Married, lives with spouse in [place]. 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   ROS:   Please see the history of present illness. All other systems are reviewed and otherwise negative.    PHYSICAL EXAM:   VS:  BP 116/60   Pulse 84   Ht 5\' 9"  (1.753 m)   Wt 168 lb 12.8 oz (76.6 kg)   LMP 05/11/2012   BMI 24.93 kg/m   BMI: Body mass index is 24.93 kg/m. GEN: Well nourished, well developed WF, in no acute distress  HEENT: normocephalic, atraumatic Neck: no JVD, carotid bruits, or masses Cardiac: RRR; no murmurs, rubs, or gallops, no edema  Respiratory:  clear to auscultation bilaterally, normal work of breathing GI: soft, nontender, nondistended, + BS MS: no deformity  or atrophy  Skin: warm and dry, no rash Neuro:  Alert and Oriented x 3, Strength and sensation are intact, follows commands Psych: euthymic mood, full affect  Wt Readings from Last 3 Encounters:  09/07/15 168 lb 12.8 oz (76.6 kg)  08/31/15 164 lb 6 oz (74.6 kg)  08/17/15 165 lb (74.8 kg)      Studies/Labs Reviewed:   EKG:  EKG was ordered today and personally reviewed by me and demonstrates NSR 84bpm, nonspecific T Wave changes - seen in 2016  Recent Labs: 12/15/2014: TSH 2.37 02/09/2015: B Natriuretic Peptide 11.4 07/16/2015: ALT 11 08/17/2015: BUN 13; Creatinine, Ser 0.84; Hemoglobin 13.2; Platelets 194; Potassium 4.0; Sodium 139   Lipid Panel    Component Value Date/Time   CHOL 254 (H) 10/31/2014 1408   TRIG 234.0 (H) 10/31/2014 1408   HDL 42.30 10/31/2014 1408   CHOLHDL 6 10/31/2014 1408   VLDL 46.8 (H) 10/31/2014 1408   LDLCALC 158 (H) 11/02/2013 1045   LDLDIRECT 212.0 10/31/2014 1408    Additional studies/ records that were reviewed today include: Summarized above    ASSESSMENT & PLAN:   1. Chest pain - she has history of completely normal coronaries so my first concern would be recurrent PE. She feels her sx are similar to when she had presented in 01/2015. Her HR does increase with activity despite being on diltiazem 180mg  daily. She reports occasionally missing doses of Xarelto but this has been rare. Her prior d-dimer was surprisngly low despite bilateral PEs. I am worried about the possibility of a false negative given small subset of high-risk patients who have negative d-dimers in the face of emboli. She and I are both in agreement of proceeding with stat CT angio to reassess for PE. I will speak with my colleague at the hospital who will likely get the result to our after-hours line. If  this is negative, will plan to arrange ETT to exclude coronary ischemia. 2. Essential HTN - controlled. 3. H/o PE - see above. 4. SVT/PACs/PVCs - she reports increased PVCs in the  ER. Recent potassium was 4.0. Observe for recurrent sx. Would place monitor if she has recurrent sx of chest tightness with sweatiness/nausea.  Disposition: F/u with Dr. Angelena Form or APP in 1 month.   Medication Adjustments/Labs and Tests Ordered: Current medicines are reviewed at length with the patient today.  Concerns regarding medicines are outlined above. Medication changes, Labs and Tests ordered today are summarized above and listed in the Patient Instructions accessible in Encounters.   Raechel Ache PA-C  09/07/2015 4:31 PM    [facility] [address], [place], [place]  [address] Phone: [phone]; Fax: [phone]

## 2015-09-07 NOTE — Patient Instructions (Signed)
Medication Instructions:  Your physician recommends that you continue on your current medications as directed. Please refer to the Current Medication list given to you today.   Labwork: None ordered  Testing/Procedures: Non-Cardiac CT Angiography (CTA), is a special type of CT scan that uses a computer to produce multi-dimensional views of major blood vessels throughout the body. In CT angiography, a contrast material is injected through an IV to help visualize the blood vessels   Follow-Up: Your physician recommends that you schedule a follow-up appointment in: 4 weeks with Dr.Mcalhany or an APP   Any Other Special Instructions Will Be Listed Below (If Applicable).     If you need a refill on your cardiac medications before your next appointment, please call your pharmacy.

## 2015-09-07 NOTE — Telephone Encounter (Signed)
   I called pt to report CTA report, which was performed earlier this evening to r/o PE.    IMPRESSION: 1. No evidence of a pulmonary embolus. Previous pulmonary emboli have resolved. 2. No acute findings.  No evidence of pneumonia or pulmonary edema.  No further eval necessary @ this time.  Caller verbalized understanding and was grateful for the call back.  Murray Hodgkins, NP 09/07/2015, 7:40 PM

## 2015-09-11 ENCOUNTER — Ambulatory Visit (INDEPENDENT_AMBULATORY_CARE_PROVIDER_SITE_OTHER): Payer: 59 | Admitting: Licensed Clinical Social Worker

## 2015-09-11 ENCOUNTER — Telehealth: Payer: Self-pay | Admitting: Cardiovascular Disease

## 2015-09-11 DIAGNOSIS — R079 Chest pain, unspecified: Secondary | ICD-10-CM

## 2015-09-11 DIAGNOSIS — F332 Major depressive disorder, recurrent severe without psychotic features: Secondary | ICD-10-CM

## 2015-09-11 DIAGNOSIS — F064 Anxiety disorder due to known physiological condition: Secondary | ICD-10-CM

## 2015-09-11 NOTE — Telephone Encounter (Signed)
New message    Returning nurse call about test results. Please call.

## 2015-09-12 NOTE — Telephone Encounter (Signed)
Returned pts call. Left another message.

## 2015-09-12 NOTE — Telephone Encounter (Signed)
Follow up ° ° ° ° °Returned a call to the nurse °

## 2015-09-12 NOTE — Telephone Encounter (Signed)
-----   Message from Charlie Pitter, [place] sent at 09/10/2015  7:19 AM EDT ----- Please call patient. Beverly Cole had already notified her no PE - tell her I am glad this was not the case. Given her recent sx and abnormal EKG would proceed with exercise Myoview nuclear stress test. Thx. Dayna Dunn PA-C

## 2015-09-12 NOTE — Telephone Encounter (Signed)
Pt aware of Dayna Dunn's recommendation for Exercise Stress Myoview.  Order will be put in EPIC and pt is aware of the instructions. Pt agreeable with this plan and will await for schedulers to call to get this scheduled

## 2015-09-18 ENCOUNTER — Ambulatory Visit (INDEPENDENT_AMBULATORY_CARE_PROVIDER_SITE_OTHER): Payer: 59 | Admitting: Licensed Clinical Social Worker

## 2015-09-18 DIAGNOSIS — F332 Major depressive disorder, recurrent severe without psychotic features: Secondary | ICD-10-CM

## 2015-09-18 DIAGNOSIS — F064 Anxiety disorder due to known physiological condition: Secondary | ICD-10-CM | POA: Diagnosis not present

## 2015-09-20 ENCOUNTER — Telehealth (HOSPITAL_COMMUNITY): Payer: Self-pay | Admitting: *Deleted

## 2015-09-20 NOTE — Telephone Encounter (Signed)
Left message on voicemail per DPR in reference to upcoming appointment scheduled on 09/26/15 with detailed instructions given per Myocardial Perfusion Study Information Sheet for the test. LM to arrive 15 minutes early, and that it is imperative to arrive on time for appointment to keep from having the test rescheduled. If you need to cancel or reschedule your appointment, please call the office within 24 hours of your appointment. Failure to do so may result in a cancellation of your appointment, and a $50 no show fee. Phone number given for call back for any questions. Kirstie Peri

## 2015-09-24 ENCOUNTER — Other Ambulatory Visit: Payer: Self-pay | Admitting: Nurse Practitioner

## 2015-09-24 DIAGNOSIS — Z1231 Encounter for screening mammogram for malignant neoplasm of breast: Secondary | ICD-10-CM

## 2015-09-25 ENCOUNTER — Ambulatory Visit (INDEPENDENT_AMBULATORY_CARE_PROVIDER_SITE_OTHER): Payer: 59 | Admitting: Licensed Clinical Social Worker

## 2015-09-25 DIAGNOSIS — F332 Major depressive disorder, recurrent severe without psychotic features: Secondary | ICD-10-CM | POA: Diagnosis not present

## 2015-09-25 DIAGNOSIS — F064 Anxiety disorder due to known physiological condition: Secondary | ICD-10-CM

## 2015-09-26 ENCOUNTER — Ambulatory Visit (HOSPITAL_COMMUNITY): Payer: 59 | Attending: Cardiology

## 2015-09-26 DIAGNOSIS — I10 Essential (primary) hypertension: Secondary | ICD-10-CM | POA: Insufficient documentation

## 2015-09-26 DIAGNOSIS — R079 Chest pain, unspecified: Secondary | ICD-10-CM | POA: Insufficient documentation

## 2015-09-26 DIAGNOSIS — R002 Palpitations: Secondary | ICD-10-CM | POA: Insufficient documentation

## 2015-09-26 DIAGNOSIS — R0609 Other forms of dyspnea: Secondary | ICD-10-CM | POA: Diagnosis not present

## 2015-09-26 LAB — MYOCARDIAL PERFUSION IMAGING
Estimated workload: 7 METS
Exercise duration (min): 5 min
Exercise duration (sec): 0 s
LV dias vol: 81 mL (ref 46–106)
LV sys vol: 25 mL
MPHR: 165 {beats}/min
Peak HR: 155 {beats}/min
Percent HR: 93 %
RATE: 0.24
RPE: 19
Rest HR: 58 {beats}/min
SDS: 0
SRS: 0
SSS: 0
TID: 0.8

## 2015-09-26 MED ORDER — TECHNETIUM TC 99M TETROFOSMIN IV KIT
10.3000 | PACK | Freq: Once | INTRAVENOUS | Status: AC | PRN
  Administered 2015-09-26: 10 via INTRAVENOUS
  Filled 2015-09-26: qty 10

## 2015-09-26 MED ORDER — TECHNETIUM TC 99M TETROFOSMIN IV KIT
31.7000 | PACK | Freq: Once | INTRAVENOUS | Status: AC | PRN
  Administered 2015-09-26: 31.7 via INTRAVENOUS
  Filled 2015-09-26: qty 32

## 2015-09-27 MED FILL — FINACEA 15% FOAM: 15 | 30 days supply | Qty: 50 | Fill #1

## 2015-10-01 ENCOUNTER — Encounter: Payer: Self-pay | Admitting: Family Medicine

## 2015-10-04 ENCOUNTER — Encounter: Payer: 59 | Admitting: Family Medicine

## 2015-10-05 ENCOUNTER — Encounter: Payer: 59 | Admitting: Family Medicine

## 2015-10-09 ENCOUNTER — Ambulatory Visit: Payer: Self-pay | Admitting: Physician Assistant

## 2015-10-09 ENCOUNTER — Ambulatory Visit (INDEPENDENT_AMBULATORY_CARE_PROVIDER_SITE_OTHER): Payer: 59 | Admitting: Licensed Clinical Social Worker

## 2015-10-09 DIAGNOSIS — F332 Major depressive disorder, recurrent severe without psychotic features: Secondary | ICD-10-CM

## 2015-10-10 MED FILL — XARELTO 20 MG TABLET: 20 | 30 days supply | Qty: 30 | Fill #1

## 2015-10-11 ENCOUNTER — Encounter: Payer: 59 | Admitting: Family Medicine

## 2015-10-16 ENCOUNTER — Other Ambulatory Visit: Payer: Self-pay

## 2015-10-16 DIAGNOSIS — I10 Essential (primary) hypertension: Secondary | ICD-10-CM

## 2015-10-16 MED ORDER — DILTIAZEM HCL ER COATED BEADS 180 MG PO CP24: ORAL_CAPSULE | ORAL | 1 refills | Status: DC

## 2015-10-16 MED FILL — CARTIA XT 180 MG CAPSULE SA: 180 | 90 days supply | Qty: 90 | Fill #0

## 2015-10-17 ENCOUNTER — Ambulatory Visit (HOSPITAL_BASED_OUTPATIENT_CLINIC_OR_DEPARTMENT_OTHER): Payer: 59 | Admitting: Hematology & Oncology

## 2015-10-17 ENCOUNTER — Encounter: Payer: Self-pay | Admitting: Hematology & Oncology

## 2015-10-17 ENCOUNTER — Other Ambulatory Visit (HOSPITAL_BASED_OUTPATIENT_CLINIC_OR_DEPARTMENT_OTHER): Payer: 59

## 2015-10-17 VITALS — BP 143/64 | HR 80 | Temp 97.8°F | Resp 18 | Ht 69.0 in | Wt 171.0 lb

## 2015-10-17 DIAGNOSIS — I2699 Other pulmonary embolism without acute cor pulmonale: Secondary | ICD-10-CM

## 2015-10-17 DIAGNOSIS — L719 Rosacea, unspecified: Secondary | ICD-10-CM | POA: Diagnosis not present

## 2015-10-17 DIAGNOSIS — I2782 Chronic pulmonary embolism: Secondary | ICD-10-CM

## 2015-10-17 DIAGNOSIS — D6862 Lupus anticoagulant syndrome: Secondary | ICD-10-CM

## 2015-10-17 DIAGNOSIS — I2601 Septic pulmonary embolism with acute cor pulmonale: Secondary | ICD-10-CM | POA: Diagnosis not present

## 2015-10-17 DIAGNOSIS — F3181 Bipolar II disorder: Secondary | ICD-10-CM | POA: Diagnosis not present

## 2015-10-17 DIAGNOSIS — Z7901 Long term (current) use of anticoagulants: Secondary | ICD-10-CM

## 2015-10-17 LAB — CMP (CANCER CENTER ONLY)
ALT(SGPT): 22 U/L (ref 10–47)
AST: 18 U/L (ref 11–38)
Albumin: 3.4 g/dL (ref 3.3–5.5)
Alkaline Phosphatase: 53 U/L (ref 26–84)
BUN, Bld: 10 mg/dL (ref 7–22)
CO2: 29 mEq/L (ref 18–33)
Calcium: 9.1 mg/dL (ref 8.0–10.3)
Chloride: 105 mEq/L (ref 98–108)
Creat: 0.9 mg/dl (ref 0.6–1.2)
Glucose, Bld: 94 mg/dL (ref 73–118)
Potassium: 3.5 mEq/L (ref 3.3–4.7)
Sodium: 134 mEq/L (ref 128–145)
Total Bilirubin: 0.6 mg/dl (ref 0.20–1.60)
Total Protein: 6.9 g/dL (ref 6.4–8.1)

## 2015-10-17 LAB — CBC WITH DIFFERENTIAL (CANCER CENTER ONLY)
BASO#: 0 10*3/uL (ref 0.0–0.2)
BASO%: 0.5 % (ref 0.0–2.0)
EOS%: 1.3 % (ref 0.0–7.0)
Eosinophils Absolute: 0.1 10*3/uL (ref 0.0–0.5)
HCT: 40.5 % (ref 34.8–46.6)
HGB: 14 g/dL (ref 11.6–15.9)
LYMPH#: 2.5 10*3/uL (ref 0.9–3.3)
LYMPH%: 39 % (ref 14.0–48.0)
MCH: 29.9 pg (ref 26.0–34.0)
MCHC: 34.6 g/dL (ref 32.0–36.0)
MCV: 87 fL (ref 81–101)
MONO#: 0.6 10*3/uL (ref 0.1–0.9)
MONO%: 8.7 % (ref 0.0–13.0)
NEUT#: 3.2 10*3/uL (ref 1.5–6.5)
NEUT%: 50.5 % (ref 39.6–80.0)
Platelets: 191 10*3/uL (ref 145–400)
RBC: 4.68 10*6/uL (ref 3.70–5.32)
RDW: 13.9 % (ref 11.1–15.7)
WBC: 6.3 10*3/uL (ref 3.9–10.0)

## 2015-10-17 NOTE — Progress Notes (Addendum)
Hematology and Oncology Follow Up Visit  Beverly Cole [id number] 10-Dec-1960 55 y.o. 10/17/2015   Principle Diagnosis:   Bilateral pulmonary embolism  Positive lupus anticoagulant-transient  Mildly depressed protein S level  Current Therapy:    Xarelto 20 mg by mouth daily-finish 1 year of therapy in December 2017  EC ASA 81 mg po q day     Interim History:  Beverly Cole is back for follow-up. She has had a pretty decent summer. We did go ahead and repeat her CT angiogram. This was done in July. The CT angiogram did not show any evidence of residual pulmonary embolism. We also did Dopplers of her leg. This was also negative for any thrombolic disease.  She has had no problem with bleeding. There's been no change in bowel or bladder habits. I'm not sure she has had the cool sculpting of her abdomen because of adipose tissue.  She is to have a colonoscopy. I told her to stop the Xarelto 2 days beforehand and then restarted the day afterwards.  She has had no cough or shortness of breath. She's had no fever..  Currently, her performance status is ECOG 0.  Medications:  Current Outpatient Prescriptions:  .  ALPRAZolam (XANAX) 0.5 MG tablet, Take 0.25-0.5 mg by mouth at bedtime as needed for sleep., Disp: , Rfl:  .  BIOTIN PO, Take 1 tablet by mouth daily., Disp: , Rfl:  .  Cholecalciferol (VITAMIN D3) 1000 UNITS CAPS, Take 1,000 Units by mouth daily. , Disp: , Rfl:  .  diltiazem (CARTIA XT) 180 MG 24 hr capsule, TAKE 1 CAPSULE (180 MG TOTAL) BY MOUTH DAILY., Disp: 90 capsule, Rfl: 1 .  divalproex (DEPAKOTE) 500 MG 24 hr tablet, Take 1,000 mg by mouth every morning. , Disp: , Rfl:  .  doxycycline (ORACEA) 40 MG capsule, TAKE 1 CAPSULE BY MOUTH DAILY., Disp: 90 capsule, Rfl: 1 .  esomeprazole (NEXIUM) 40 MG capsule, TAKE 1 CAPSULE BY MOUTH AT BEDTIME. (Patient taking differently: TAKE 1 CAPSULE BY MOUTH IN MORNING), Disp: 90 capsule, Rfl: 3 .  FINACEA 15 % FOAM, Apply 1 application  topically daily as needed (skin)., Disp: 50 g, Rfl: 3 .  fluticasone (FLONASE) 50 MCG/ACT nasal spray, Place 2 sprays into the nose daily as needed. For allergies, Disp: , Rfl:  .  lidocaine (XYLOCAINE) 2 % jelly, Apply topically as needed (for dysparunia)., Disp: 30 mL, Rfl: 12 .  nitrofurantoin, macrocrystal-monohydrate, (MACROBID) 100 MG capsule, Take 1 capsule (100 mg total) by mouth once as needed (post coital)., Disp: 30 capsule, Rfl: 8 .  pramipexole (MIRAPEX) 0.125 MG tablet, Take 0.375 mg by mouth daily. TAKE 3 TABS DAILY, Disp: , Rfl: 0 .  Probiotic Product (PROBIOTIC DAILY PO), Take 1 tablet by mouth daily. , Disp: , Rfl:  .  propranolol (INDERAL) 10 MG tablet, Take 20 mg by mouth daily as needed (shakiness). Reported on 05/17/2015, Disp: , Rfl: 1 .  traZODone (DESYREL) 50 MG tablet, Take 1 tablet by mouth at bedtime., Disp: , Rfl: 0 .  XARELTO 20 MG TABS tablet, TAKE 1 TABLET BY MOUTH DAILY WITH SUPPER, Disp: 30 tablet, Rfl: 5  Allergies:  Allergies  Allergen Reactions  . Cariprazine Other (See Comments)    Patient becomes suicidal when taking this medication.   . Lopressor [Metoprolol] Other (See Comments)    Hallucinations hair falls out  . Nickel Rash    Past Medical History, Surgical history, Social history, and Family History were reviewed and updated.  Review of Systems: As above  Physical Exam:  height is 5\' 9"  (1.753 m) and weight is 171 lb (77.6 kg). Her oral temperature is 97.8 F (36.6 C). Her blood pressure is 143/64 (abnormal) and her pulse is 80. Her respiration is 18.   Wt Readings from Last 3 Encounters:  10/17/15 171 lb (77.6 kg)  09/26/15 168 lb (76.2 kg)  09/07/15 168 lb 12.8 oz (76.6 kg)     Well-developed and well-nourished white female in no obvious distress. Head exam shows no ocular or oral lesions. She has no palpable cervical or supraclavicular lymph nodes. Lungs are clear bilaterally. Cardiac exam regular rate and rhythm with no murmurs, rubs  or bruits. Abdomen is soft. She has good bowel sounds. There is no fluid wave. There is no palpable liver or spleen tip. Back exam shows no tenderness over the spine, ribs or hips. Extremities shows no clubbing, cyanosis or edema. No venous cords noted in her legs. She is a negative Homans sign bilaterally. Skin exam shows no rashes, ecchymoses or petechia.  Lab Results  Component Value Date   WBC 6.3 10/17/2015   HGB 14.0 10/17/2015   HCT 40.5 10/17/2015   MCV 87 10/17/2015   PLT 191 10/17/2015     Chemistry      Component Value Date/Time   NA 134 10/17/2015 1033   NA 142 07/16/2015 1132   K 3.5 10/17/2015 1033   K 3.8 07/16/2015 1132   CL 105 10/17/2015 1033   CO2 29 10/17/2015 1033   CO2 29 07/16/2015 1132   BUN 10 10/17/2015 1033   BUN 7.4 07/16/2015 1132   CREATININE 0.9 10/17/2015 1033   CREATININE 0.9 07/16/2015 1132      Component Value Date/Time   CALCIUM 9.1 10/17/2015 1033   CALCIUM 9.4 07/16/2015 1132   ALKPHOS 53 10/17/2015 1033   ALKPHOS 52 07/16/2015 1132   AST 18 10/17/2015 1033   AST 12 07/16/2015 1132   ALT 22 10/17/2015 1033   ALT 11 07/16/2015 1132   BILITOT 0.60 10/17/2015 1033   BILITOT 0.35 07/16/2015 1132         Impression and Plan: Beverly Cole is a 55 year old white female. She had a pulmonary embolism. This was a bilateral pulmonary embolism. She had been on hormonal replacement therapy.  I'm not sure if her hypercoagulable studies are significant for a thrombophilic state. We will have to repeat these when we see her back.  If her lupus anticoagulant is positive, then would might need to get her on aspirin.  Otherwise, I think one year of full dose anticoagulation would be appropriate and then 1 year of low-dose anticoagulation would be considered appropriate.  I will like to see her back in December. At that point time, I think we could then get her over to low-dose maintenance Xarelto.  I spent about 25 minutes with her. It is always  nice talking to them.   Volanda Napoleon, MD 9/6/201711:45 AM   ADDENDUM: She dose have a (+) lupus anti-coagulant. As such, I will add ASA 81mg  to her regimen.

## 2015-10-18 MED FILL — traZODone HCL 50 MG TABS: 50 | 90 days supply | Qty: 90 | Fill #0

## 2015-10-19 ENCOUNTER — Ambulatory Visit
Admission: RE | Admit: 2015-10-19 | Discharge: 2015-10-19 | Disposition: A | Discharge status: Home / Self Care | Payer: 59 | Source: Ambulatory Visit | Attending: Nurse Practitioner | Admitting: Nurse Practitioner

## 2015-10-19 DIAGNOSIS — Z1231 Encounter for screening mammogram for malignant neoplasm of breast: Secondary | ICD-10-CM

## 2015-10-19 LAB — LUPUS ANTICOAGULANT PANEL
PTT-LA: 40.7 s (ref 0.0–51.9)
dRVVT Confirm: 2.1 ratio — ABNORMAL HIGH (ref 0.8–1.2)
dRVVT Mix: 68.7 s — ABNORMAL HIGH (ref 0.0–47.0)
dRVVT: 120.5 s — ABNORMAL HIGH (ref 0.0–47.0)

## 2015-10-19 LAB — PROTEIN S ACTIVITY: Protein S-Functional: 141 % — ABNORMAL HIGH (ref 63–140)

## 2015-10-19 LAB — PROTEIN S, TOTAL: Protein S, Total: 137 % (ref 60–150)

## 2015-10-23 ENCOUNTER — Telehealth: Payer: Self-pay | Admitting: *Deleted

## 2015-10-23 ENCOUNTER — Ambulatory Visit (INDEPENDENT_AMBULATORY_CARE_PROVIDER_SITE_OTHER): Payer: 59 | Admitting: Psychology

## 2015-10-23 DIAGNOSIS — F064 Anxiety disorder due to known physiological condition: Secondary | ICD-10-CM | POA: Diagnosis not present

## 2015-10-23 DIAGNOSIS — F332 Major depressive disorder, recurrent severe without psychotic features: Secondary | ICD-10-CM

## 2015-10-23 NOTE — Telephone Encounter (Signed)
Patient calling asking if she can get a colonoscopy even though she is on blood thinners.  [place] with Dr. Marin Olp to have colonoscopy but needs to be off blood thinner 2 days prior to procedure.  Patient to see them this week.  She will let them know and they will fax [place] proper documentation.

## 2015-10-25 DIAGNOSIS — K5900 Constipation, unspecified: Secondary | ICD-10-CM | POA: Diagnosis not present

## 2015-10-25 DIAGNOSIS — K219 Gastro-esophageal reflux disease without esophagitis: Secondary | ICD-10-CM | POA: Diagnosis not present

## 2015-10-25 DIAGNOSIS — R194 Change in bowel habit: Secondary | ICD-10-CM | POA: Diagnosis not present

## 2015-10-25 DIAGNOSIS — R635 Abnormal weight gain: Secondary | ICD-10-CM | POA: Diagnosis not present

## 2015-10-25 DIAGNOSIS — R1310 Dysphagia, unspecified: Secondary | ICD-10-CM | POA: Diagnosis not present

## 2015-10-25 DIAGNOSIS — Z1211 Encounter for screening for malignant neoplasm of colon: Secondary | ICD-10-CM | POA: Diagnosis not present

## 2015-10-25 DIAGNOSIS — Z800 Family history of malignant neoplasm of digestive organs: Secondary | ICD-10-CM | POA: Diagnosis not present

## 2015-10-26 ENCOUNTER — Other Ambulatory Visit: Payer: Self-pay | Admitting: Gastroenterology

## 2015-10-26 DIAGNOSIS — R1310 Dysphagia, unspecified: Secondary | ICD-10-CM

## 2015-10-26 MED FILL — FINACEA 15% FOAM: 15 | 30 days supply | Qty: 50 | Fill #2

## 2015-11-06 DIAGNOSIS — M4316 Spondylolisthesis, lumbar region: Secondary | ICD-10-CM | POA: Diagnosis not present

## 2015-11-06 DIAGNOSIS — M9901 Segmental and somatic dysfunction of cervical region: Secondary | ICD-10-CM | POA: Diagnosis not present

## 2015-11-06 DIAGNOSIS — M9903 Segmental and somatic dysfunction of lumbar region: Secondary | ICD-10-CM | POA: Diagnosis not present

## 2015-11-06 DIAGNOSIS — M9902 Segmental and somatic dysfunction of thoracic region: Secondary | ICD-10-CM | POA: Diagnosis not present

## 2015-11-06 MED FILL — PRAMIPEXOLE 0.25 MG TABLET: 0.25 | 90 days supply | Qty: 180 | Fill #0

## 2015-11-07 DIAGNOSIS — M9903 Segmental and somatic dysfunction of lumbar region: Secondary | ICD-10-CM | POA: Diagnosis not present

## 2015-11-07 DIAGNOSIS — M9902 Segmental and somatic dysfunction of thoracic region: Secondary | ICD-10-CM | POA: Diagnosis not present

## 2015-11-07 DIAGNOSIS — M9901 Segmental and somatic dysfunction of cervical region: Secondary | ICD-10-CM | POA: Diagnosis not present

## 2015-11-07 DIAGNOSIS — M4316 Spondylolisthesis, lumbar region: Secondary | ICD-10-CM | POA: Diagnosis not present

## 2015-11-08 DIAGNOSIS — M9901 Segmental and somatic dysfunction of cervical region: Secondary | ICD-10-CM | POA: Diagnosis not present

## 2015-11-08 DIAGNOSIS — M9902 Segmental and somatic dysfunction of thoracic region: Secondary | ICD-10-CM | POA: Diagnosis not present

## 2015-11-08 DIAGNOSIS — M4316 Spondylolisthesis, lumbar region: Secondary | ICD-10-CM | POA: Diagnosis not present

## 2015-11-08 DIAGNOSIS — M9903 Segmental and somatic dysfunction of lumbar region: Secondary | ICD-10-CM | POA: Diagnosis not present

## 2015-11-12 MED FILL — XARELTO 20 MG TABLET: 20 | 30 days supply | Qty: 30 | Fill #2

## 2015-11-13 ENCOUNTER — Encounter: Payer: Self-pay | Admitting: Family Medicine

## 2015-11-13 ENCOUNTER — Ambulatory Visit (INDEPENDENT_AMBULATORY_CARE_PROVIDER_SITE_OTHER): Payer: 59 | Admitting: Family Medicine

## 2015-11-13 VITALS — BP 121/78 | HR 75 | Temp 98.0°F | Resp 16 | Ht 69.0 in | Wt 168.0 lb

## 2015-11-13 DIAGNOSIS — Z23 Encounter for immunization: Secondary | ICD-10-CM

## 2015-11-13 DIAGNOSIS — R760 Raised antibody titer: Secondary | ICD-10-CM | POA: Diagnosis not present

## 2015-11-13 DIAGNOSIS — L709 Acne, unspecified: Secondary | ICD-10-CM | POA: Diagnosis not present

## 2015-11-13 DIAGNOSIS — I10 Essential (primary) hypertension: Secondary | ICD-10-CM

## 2015-11-13 DIAGNOSIS — Z0000 Encounter for general adult medical examination without abnormal findings: Secondary | ICD-10-CM

## 2015-11-13 LAB — POCT URINALYSIS DIPSTICK
Blood, UA: NEGATIVE
Glucose, UA: NEGATIVE
Leukocytes, UA: NEGATIVE
Nitrite, UA: NEGATIVE
Spec Grav, UA: >=1.03
Urobilinogen, UA: 0.2
pH, UA: 6

## 2015-11-13 MED ORDER — TRETINOIN 0.05 % EX CREA: TOPICAL_CREAM | Freq: Every day | CUTANEOUS | 3 refills | Status: DC

## 2015-11-13 NOTE — Progress Notes (Signed)
Subjective:     Beverly Cole is a 55 y.o. female and is here for a comprehensive physical exam. The patient reports problems - + lupus anticoagulant --- she needs a rheum referral.  Social History   Social History  . Marital status: Married    Spouse name: N/A  . Number of children: 0  . Years of education: N/A   Occupational History  . 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 Smoking status: Former Smoker    Packs/day: 1.00    Years: 25.00    Types: Cigarettes    Quit date: 02/11/1996  . Smokeless tobacco: Never Used     Comment: Married, lives with spouse. 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 Alcohol use 1.8 oz/week    3 Standard drinks or equivalent per week  . Drug use: No  . Sexual activity: Yes    Partners: Male    Birth control/ protection: Post-menopausal   Other Topics Concern  . Not on file   Social History Narrative   Married, lives with spouse in [place]. 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 COLONOSCOPY  02/13/2016 (Originally 10/28/2015)  . MAMMOGRAM  10/18/2017  . PAP SMEAR  06/05/2018  . TETANUS/TDAP  05/12/2021  . INFLUENZA VACCINE  Completed  . Hepatitis C Screening  Completed  . HIV Screening  Completed    The following portions of the patient's history were reviewed and updated as appropriate:  She  has a past medical history of Anxiety; Atrial fibrillation ([place]); BIPOLAR DISORDER UNSPECIFIED; Bradycardia; Bursitis of hip (09/1999); DEPRESSION; Fistula of female genitalia (03/29/2007); GERD; Headache(784.0); HYPERLIPIDEMIA; Hypertension; Hypertension; IRRITABLE BOWEL SYNDROME, HX OF; Menopausal state (01/2012); Mitral valve prolapse (12/17/2000); MYALGIA; Normal coronary arteries; PAT (paroxysmal atrial tachycardia) ([place]); PE (pulmonary embolism) (02/09/2015); Premature atrial contractions; Shortness of breath; Thyroid  nodule (2014); and Tremors of nervous system. She  does not have any pertinent problems on file. She  has a past surgical history that includes Lipoma (R) side (1990) and Colonoscopy. Her family history includes Cancer in her brother and father; Colon cancer in her paternal uncle; Depression in her father; Hyperlipidemia in her father and mother; Hypertension in her mother; Lung cancer in her mother; Sarcoidosis in her other; Testicular cancer in her other. She  reports that she quit smoking about 19 years ago. Her smoking use included Cigarettes. She has a 25.00 pack-year smoking history. She has never used smokeless tobacco. She reports that she drinks about 1.8 oz of alcohol per week . She reports that she does not use drugs. She has a current medication list which includes the following prescription(s): alprazolam, aspirin ec, biotin, vitamin d3, diltiazem, divalproex, doxycycline, esomeprazole, finacea, fluticasone, lidocaine, nitrofurantoin (macrocrystal-monohydrate), pramipexole, probiotic product, propranolol, trazodone, xarelto, and tretinoin. Current Outpatient Prescriptions on File Prior to Visit  Medication Sig Dispense Refill  . ALPRAZolam (XANAX) 0.5 MG tablet Take 0.25-0.5 mg by mouth at bedtime as needed for sleep.    Marland Kitchen BIOTIN PO Take 1 tablet by mouth daily.    . Cholecalciferol (VITAMIN D3) 1000 UNITS CAPS Take 1,000 Units by mouth daily.     Marland Kitchen diltiazem (CARTIA XT) 180 MG 24 hr capsule TAKE 1 CAPSULE (180 MG TOTAL) BY MOUTH DAILY. 90 capsule 1  . divalproex (DEPAKOTE) 500 MG 24 hr tablet Take  1,000 mg by mouth every morning.     Marland Kitchen doxycycline (ORACEA) 40 MG capsule TAKE 1 CAPSULE BY MOUTH DAILY. 90 capsule 1  . esomeprazole (NEXIUM) 40 MG capsule TAKE 1 CAPSULE BY MOUTH AT BEDTIME. (Patient taking differently: TAKE 1 CAPSULE BY MOUTH IN MORNING) 90 capsule 3  . FINACEA 15 % FOAM Apply 1 application topically daily as needed (skin). 50 g 3  . fluticasone (FLONASE) 50 MCG/ACT nasal  spray Place 2 sprays into the nose daily as needed. For allergies    . lidocaine (XYLOCAINE) 2 % jelly Apply topically as needed (for dysparunia). 30 mL 12  . nitrofurantoin, macrocrystal-monohydrate, (MACROBID) 100 MG capsule Take 1 capsule (100 mg total) by mouth once as needed (post coital). 30 capsule 8  . pramipexole (MIRAPEX) 0.125 MG tablet Take 0.375 mg by mouth daily. TAKE 3 TABS DAILY  0  . Probiotic Product (PROBIOTIC DAILY PO) Take 1 tablet by mouth daily.     . propranolol (INDERAL) 10 MG tablet Take 20 mg by mouth daily as needed (shakiness). Reported on 05/17/2015  1  . traZODone (DESYREL) 50 MG tablet Take 1 tablet by mouth at bedtime.  0  . XARELTO 20 MG TABS tablet TAKE 1 TABLET BY MOUTH DAILY WITH SUPPER 30 tablet 5   No current facility-administered medications on file prior to visit.    She is allergic to cariprazine; lopressor [metoprolol]; and nickel..  Review of Systems Review of Systems  Constitutional: Negative for activity change, appetite change and fatigue.  HENT: Negative for hearing loss, congestion, tinnitus and ear discharge.  dentist q53m Eyes: Negative for visual disturbance (see optho q1y -- vision corrected to 20/20 with glasses).  Respiratory: Negative for cough, chest tightness and shortness of breath.   Cardiovascular: Negative for chest pain, palpitations and leg swelling.  Gastrointestinal: Negative for abdominal pain, diarrhea, constipation and abdominal distention.  Genitourinary: Negative for urgency, frequency, decreased urine volume and difficulty urinating.  Musculoskeletal: Negative for back pain, arthralgias and gait problem.  Skin: Negative for color change, pallor and rash.  Neurological: Negative for dizziness, light-headedness, numbness and headaches.  Hematological: Negative for adenopathy. Does not bruise/bleed easily.  Psychiatric/Behavioral: Negative for suicidal ideas, confusion, sleep disturbance, self-injury, dysphoric mood,  decreased concentration and agitation.       Objective:    BP 121/78 (BP Location: Right Arm, Patient Position: Sitting, Cuff Size: Normal)   Pulse 75   Temp 98 F (36.7 C) (Oral)   Resp 16   Ht 5\' 9"  (1.753 m)   Wt 168 lb (76.2 kg)   LMP 05/11/2012   SpO2 97%   BMI 24.81 kg/m  General appearance: alert, cooperative, appears stated age and no distress Head: Normocephalic, without obvious abnormality, atraumatic Eyes: conjunctivae/corneas clear. PERRL, EOM's intact. Fundi benign. Ears: normal TM's and external ear canals both ears Nose: Nares normal. Septum midline. Mucosa normal. No drainage or sinus tenderness. Throat: lips, mucosa, and tongue normal; teeth and gums normal Neck: no adenopathy, no carotid bruit, no JVD, supple, symmetrical, trachea midline and thyroid not enlarged, symmetric, no tenderness/mass/nodules Back: symmetric, no curvature. ROM normal. No CVA tenderness. Lungs: clear to auscultation bilaterally Breasts: gyn Heart: regular rate and rhythm, S1, S2 normal, no murmur, click, rub or gallop Abdomen: soft, non-tender; bowel sounds normal; no masses,  no organomegaly Pelvic: deferred --gyn Extremities: extremities normal, atraumatic, no cyanosis or edema Pulses: 2+ and symmetric Skin: Skin color, texture, turgor normal. No rashes or lesions Lymph nodes: Cervical, supraclavicular, and  axillary nodes normal. Neurologic: Alert and oriented X 3, normal strength and tone. Normal symmetric reflexes. Normal coordination and gait    Assessment:    Healthy female exam.      Plan:    ghm utd Check labs See After Visit Summary for Counseling Recommendations    1. Adult acne Refill med - tretinoin (RETIN-A) 0.05 % cream; Apply topically at bedtime.  Dispense: 45 g; Refill: 3  2. Encounter for immunization   - aspirin EC 81 MG tablet; Take 81 mg by mouth daily. - tretinoin (RETIN-A) 0.05 % cream; Apply topically at bedtime.  Dispense: 45 g; Refill: 3 -  Ambulatory referral to Rheumatology  3. Lupus anticoagulant positive + per hematology-- referral put in - Ambulatory referral to Rheumatology  4. Preventative health care See above--- labs from hematology reviewed - POCT urinalysis dipstick - TSH - Lipid panel  5. Essential hypertension stable - POCT urinalysis dipstick - TSH - Lipid panel

## 2015-11-13 NOTE — Progress Notes (Signed)
Pre visit review using our clinic review tool, if applicable. No additional management support is needed unless otherwise documented below in the visit note. 

## 2015-11-13 NOTE — Patient Instructions (Signed)
Preventive Care for Adults, Female A healthy lifestyle and preventive care can promote health and wellness. Preventive health guidelines for women include the following key practices.  A routine yearly physical is a good way to check with your health care provider about your health and preventive screening. It is a chance to share any concerns and updates on your health and to receive a thorough exam.  Visit your dentist for a routine exam and preventive care every 6 months. Brush your teeth twice a day and floss once a day. Good oral hygiene prevents tooth decay and gum disease.  The frequency of eye exams is based on your age, health, family medical history, use of contact lenses, and other factors. Follow your health care provider's recommendations for frequency of eye exams.  Eat a healthy diet. Foods like vegetables, fruits, whole grains, low-fat dairy products, and lean protein foods contain the nutrients you need without too many calories. Decrease your intake of foods high in solid fats, added sugars, and salt. Eat the right amount of calories for you.Get information about a proper diet from your health care provider, if necessary.  Regular physical exercise is one of the most important things you can do for your health. Most adults should get at least 150 minutes of moderate-intensity exercise (any activity that increases your heart rate and causes you to sweat) each week. In addition, most adults need muscle-strengthening exercises on 2 or more days a week.  Maintain a healthy weight. The body mass index (BMI) is a screening tool to identify possible weight problems. It provides an estimate of body fat based on height and weight. Your health care provider can find your BMI and can help you achieve or maintain a healthy weight.For adults 20 years and older:  A BMI below 18.5 is considered underweight.  A BMI of 18.5 to 24.9 is normal.  A BMI of 25 to 29.9 is considered overweight.  A  BMI of 30 and above is considered obese.  Maintain normal blood lipids and cholesterol levels by exercising and minimizing your intake of saturated fat. Eat a balanced diet with plenty of fruit and vegetables. Blood tests for lipids and cholesterol should begin at age 45 and be repeated every 5 years. If your lipid or cholesterol levels are high, you are over 50, or you are at high risk for heart disease, you may need your cholesterol levels checked more frequently.Ongoing high lipid and cholesterol levels should be treated with medicines if diet and exercise are not working.  If you smoke, find out from your health care provider how to quit. If you do not use tobacco, do not start.  Lung cancer screening is recommended for adults aged 45-80 years who are at high risk for developing lung cancer because of a history of smoking. A yearly low-dose CT scan of the lungs is recommended for people who have at least a 30-pack-year history of smoking and are a current smoker or have quit within the past 15 years. A pack year of smoking is smoking an average of 1 pack of cigarettes a day for 1 year (for example: 1 pack a day for 30 years or 2 packs a day for 15 years). Yearly screening should continue until the smoker has stopped smoking for at least 15 years. Yearly screening should be stopped for people who develop a health problem that would prevent them from having lung cancer treatment.  If you are pregnant, do not drink alcohol. If you are  breastfeeding, be very cautious about drinking alcohol. If you are not pregnant and choose to drink alcohol, do not have more than 1 drink per day. One drink is considered to be 12 ounces (355 mL) of beer, 5 ounces (148 mL) of wine, or 1.5 ounces (44 mL) of liquor.  Avoid use of street drugs. Do not share needles with anyone. Ask for help if you need support or instructions about stopping the use of drugs.  High blood pressure causes heart disease and increases the risk  of stroke. Your blood pressure should be checked at least every 1 to 2 years. Ongoing high blood pressure should be treated with medicines if weight loss and exercise do not work.  If you are 55-79 years old, ask your health care provider if you should take aspirin to prevent strokes.  Diabetes screening is done by taking a blood sample to check your blood glucose level after you have not eaten for a certain period of time (fasting). If you are not overweight and you do not have risk factors for diabetes, you should be screened once every 3 years starting at age 45. If you are overweight or obese and you are 40-70 years of age, you should be screened for diabetes every year as part of your cardiovascular risk assessment.  Breast cancer screening is essential preventive care for women. You should practice "breast self-awareness." This means understanding the normal appearance and feel of your breasts and may include breast self-examination. Any changes detected, no matter how small, should be reported to a health care provider. Women in their 20s and 30s should have a clinical breast exam (CBE) by a health care provider as part of a regular health exam every 1 to 3 years. After age 40, women should have a CBE every year. Starting at age 40, women should consider having a mammogram (breast X-ray test) every year. Women who have a family history of breast cancer should talk to their health care provider about genetic screening. Women at a high risk of breast cancer should talk to their health care providers about having an MRI and a mammogram every year.  Breast cancer gene (BRCA)-related cancer risk assessment is recommended for women who have family members with BRCA-related cancers. BRCA-related cancers include breast, ovarian, tubal, and peritoneal cancers. Having family members with these cancers may be associated with an increased risk for harmful changes (mutations) in the breast cancer genes BRCA1 and  BRCA2. Results of the assessment will determine the need for genetic counseling and BRCA1 and BRCA2 testing.  Your health care provider may recommend that you be screened regularly for cancer of the pelvic organs (ovaries, uterus, and vagina). This screening involves a pelvic examination, including checking for microscopic changes to the surface of your cervix (Pap test). You may be encouraged to have this screening done every 3 years, beginning at age 21.  For women ages 30-65, health care providers may recommend pelvic exams and Pap testing every 3 years, or they may recommend the Pap and pelvic exam, combined with testing for human papilloma virus (HPV), every 5 years. Some types of HPV increase your risk of cervical cancer. Testing for HPV may also be done on women of any age with unclear Pap test results.  Other health care providers may not recommend any screening for nonpregnant women who are considered low risk for pelvic cancer and who do not have symptoms. Ask your health care provider if a screening pelvic exam is right for   you.  If you have had past treatment for cervical cancer or a condition that could lead to cancer, you need Pap tests and screening for cancer for at least 20 years after your treatment. If Pap tests have been discontinued, your risk factors (such as having a new sexual partner) need to be reassessed to determine if screening should resume. Some women have medical problems that increase the chance of getting cervical cancer. In these cases, your health care provider may recommend more frequent screening and Pap tests.  Colorectal cancer can be detected and often prevented. Most routine colorectal cancer screening begins at the age of 50 years and continues through age 75 years. However, your health care provider may recommend screening at an earlier age if you have risk factors for colon cancer. On a yearly basis, your health care provider may provide home test kits to check  for hidden blood in the stool. Use of a small camera at the end of a tube, to directly examine the colon (sigmoidoscopy or colonoscopy), can detect the earliest forms of colorectal cancer. Talk to your health care provider about this at age 50, when routine screening begins. Direct exam of the colon should be repeated every 5-10 years through age 75 years, unless early forms of precancerous polyps or small growths are found.  People who are at an increased risk for hepatitis B should be screened for this virus. You are considered at high risk for hepatitis B if:  You were born in a country where hepatitis B occurs often. Talk with your health care provider about which countries are considered high risk.  Your parents were born in a high-risk country and you have not received a shot to protect against hepatitis B (hepatitis B vaccine).  You have HIV or AIDS.  You use needles to inject street drugs.  You live with, or have sex with, someone who has hepatitis B.  You get hemodialysis treatment.  You take certain medicines for conditions like cancer, organ transplantation, and autoimmune conditions.  Hepatitis C blood testing is recommended for all people born from 1945 through 1965 and any individual with known risks for hepatitis C.  Practice safe sex. Use condoms and avoid high-risk sexual practices to reduce the spread of sexually transmitted infections (STIs). STIs include gonorrhea, chlamydia, syphilis, trichomonas, herpes, HPV, and human immunodeficiency virus (HIV). Herpes, HIV, and HPV are viral illnesses that have no cure. They can result in disability, cancer, and death.  You should be screened for sexually transmitted illnesses (STIs) including gonorrhea and chlamydia if:  You are sexually active and are younger than 24 years.  You are older than 24 years and your health care provider tells you that you are at risk for this type of infection.  Your sexual activity has changed  since you were last screened and you are at an increased risk for chlamydia or gonorrhea. Ask your health care provider if you are at risk.  If you are at risk of being infected with HIV, it is recommended that you take a prescription medicine daily to prevent HIV infection. This is called preexposure prophylaxis (PrEP). You are considered at risk if:  You are sexually active and do not regularly use condoms or know the HIV status of your partner(s).  You take drugs by injection.  You are sexually active with a partner who has HIV.  Talk with your health care provider about whether you are at high risk of being infected with HIV. If   you choose to begin PrEP, you should first be tested for HIV. You should then be tested every 3 months for as long as you are taking PrEP.  Osteoporosis is a disease in which the bones lose minerals and strength with aging. This can result in serious bone fractures or breaks. The risk of osteoporosis can be identified using a bone density scan. Women ages 67 years and over and women at risk for fractures or osteoporosis should discuss screening with their health care providers. Ask your health care provider whether you should take a calcium supplement or vitamin D to reduce the rate of osteoporosis.  Menopause can be associated with physical symptoms and risks. Hormone replacement therapy is available to decrease symptoms and risks. You should talk to your health care provider about whether hormone replacement therapy is right for you.  Use sunscreen. Apply sunscreen liberally and repeatedly throughout the day. You should seek shade when your shadow is shorter than you. Protect yourself by wearing long sleeves, pants, a wide-brimmed hat, and sunglasses year round, whenever you are outdoors.  Once a month, do a whole body skin exam, using a mirror to look at the skin on your back. Tell your health care provider of new moles, moles that have irregular borders, moles that  are larger than a pencil eraser, or moles that have changed in shape or color.  Stay current with required vaccines (immunizations).  Influenza vaccine. All adults should be immunized every year.  Tetanus, diphtheria, and acellular pertussis (Td, Tdap) vaccine. Pregnant women should receive 1 dose of Tdap vaccine during each pregnancy. The dose should be obtained regardless of the length of time since the last dose. Immunization is preferred during the 27th-36th week of gestation. An adult who has not previously received Tdap or who does not know her vaccine status should receive 1 dose of Tdap. This initial dose should be followed by tetanus and diphtheria toxoids (Td) booster doses every 10 years. Adults with an unknown or incomplete history of completing a 3-dose immunization series with Td-containing vaccines should begin or complete a primary immunization series including a Tdap dose. Adults should receive a Td booster every 10 years.  Varicella vaccine. An adult without evidence of immunity to varicella should receive 2 doses or a second dose if she has previously received 1 dose. Pregnant females who do not have evidence of immunity should receive the first dose after pregnancy. This first dose should be obtained before leaving the health care facility. The second dose should be obtained 4-8 weeks after the first dose.  Human papillomavirus (HPV) vaccine. Females aged 13-26 years who have not received the vaccine previously should obtain the 3-dose series. The vaccine is not recommended for use in pregnant females. However, pregnancy testing is not needed before receiving a dose. If a female is found to be pregnant after receiving a dose, no treatment is needed. In that case, the remaining doses should be delayed until after the pregnancy. Immunization is recommended for any person with an immunocompromised condition through the age of 61 years if she did not get any or all doses earlier. During the  3-dose series, the second dose should be obtained 4-8 weeks after the first dose. The third dose should be obtained 24 weeks after the first dose and 16 weeks after the second dose.  Zoster vaccine. One dose is recommended for adults aged 30 years or older unless certain conditions are present.  Measles, mumps, and rubella (MMR) vaccine. Adults born  before 1957 generally are considered immune to measles and mumps. Adults born in 1957 or later should have 1 or more doses of MMR vaccine unless there is a contraindication to the vaccine or there is laboratory evidence of immunity to each of the three diseases. A routine second dose of MMR vaccine should be obtained at least 28 days after the first dose for students attending postsecondary schools, health care workers, or international travelers. People who received inactivated measles vaccine or an unknown type of measles vaccine during 1963-1967 should receive 2 doses of MMR vaccine. People who received inactivated mumps vaccine or an unknown type of mumps vaccine before 1979 and are at high risk for mumps infection should consider immunization with 2 doses of MMR vaccine. For females of childbearing age, rubella immunity should be determined. If there is no evidence of immunity, females who are not pregnant should be vaccinated. If there is no evidence of immunity, females who are pregnant should delay immunization until after pregnancy. Unvaccinated health care workers born before 1957 who lack laboratory evidence of measles, mumps, or rubella immunity or laboratory confirmation of disease should consider measles and mumps immunization with 2 doses of MMR vaccine or rubella immunization with 1 dose of MMR vaccine.  Pneumococcal 13-valent conjugate (PCV13) vaccine. When indicated, a person who is uncertain of his immunization history and has no record of immunization should receive the PCV13 vaccine. All adults 65 years of age and older should receive this  vaccine. An adult aged 19 years or older who has certain medical conditions and has not been previously immunized should receive 1 dose of PCV13 vaccine. This PCV13 should be followed with a dose of pneumococcal polysaccharide (PPSV23) vaccine. Adults who are at high risk for pneumococcal disease should obtain the PPSV23 vaccine at least 8 weeks after the dose of PCV13 vaccine. Adults older than 56 years of age who have normal immune system function should obtain the PPSV23 vaccine dose at least 1 year after the dose of PCV13 vaccine.  Pneumococcal polysaccharide (PPSV23) vaccine. When PCV13 is also indicated, PCV13 should be obtained first. All adults aged 65 years and older should be immunized. An adult younger than age 65 years who has certain medical conditions should be immunized. Any person who resides in a nursing home or long-term care facility should be immunized. An adult smoker should be immunized. People with an immunocompromised condition and certain other conditions should receive both PCV13 and PPSV23 vaccines. People with human immunodeficiency virus (HIV) infection should be immunized as soon as possible after diagnosis. Immunization during chemotherapy or radiation therapy should be avoided. Routine use of PPSV23 vaccine is not recommended for American Indians, [place] Natives, or people younger than 65 years unless there are medical conditions that require PPSV23 vaccine. When indicated, people who have unknown immunization and have no record of immunization should receive PPSV23 vaccine. One-time revaccination 5 years after the first dose of PPSV23 is recommended for people aged 19-64 years who have chronic kidney failure, nephrotic syndrome, asplenia, or immunocompromised conditions. People who received 1-2 doses of PPSV23 before age 65 years should receive another dose of PPSV23 vaccine at age 65 years or later if at least 5 years have passed since the previous dose. Doses of PPSV23 are not  needed for people immunized with PPSV23 at or after age 65 years.  Meningococcal vaccine. Adults with asplenia or persistent complement component deficiencies should receive 2 doses of quadrivalent meningococcal conjugate (MenACWY-D) vaccine. The doses should be obtained   at least 2 months apart. Microbiologists working with certain meningococcal bacteria, [place] recruits, people at risk during an outbreak, and people who travel to or live in countries with a high rate of meningitis should be immunized. A first-year college student up through age 34 years who is living in a residence hall should receive a dose if she did not receive a dose on or after her 16th birthday. Adults who have certain high-risk conditions should receive one or more doses of vaccine.  Hepatitis A vaccine. Adults who wish to be protected from this disease, have certain high-risk conditions, work with hepatitis A-infected animals, work in hepatitis A research labs, or travel to or work in countries with a high rate of hepatitis A should be immunized. Adults who were previously unvaccinated and who anticipate close contact with an international adoptee during the first 60 days after arrival in the [place] from a country with a high rate of hepatitis A should be immunized.  Hepatitis B vaccine. Adults who wish to be protected from this disease, have certain high-risk conditions, may be exposed to blood or other infectious body fluids, are household contacts or sex partners of hepatitis B positive people, are clients or workers in certain care facilities, or travel to or work in countries with a high rate of hepatitis B should be immunized.  Haemophilus influenzae type b (Hib) vaccine. A previously unvaccinated person with asplenia or sickle cell disease or having a scheduled splenectomy should receive 1 dose of Hib vaccine. Regardless of previous immunization, a recipient of a hematopoietic stem cell transplant should receive a  3-dose series 6-12 months after her successful transplant. Hib vaccine is not recommended for adults with HIV infection. Preventive Services / Frequency Ages 35 to 4 years  Blood pressure check.** / Every 3-5 years.  Lipid and cholesterol check.** / Every 5 years beginning at age 60.  Clinical breast exam.** / Every 3 years for women in their 71s and 10s.  BRCA-related cancer risk assessment.** / For women who have family members with a BRCA-related cancer (breast, ovarian, tubal, or peritoneal cancers).  Pap test.** / Every 2 years from ages 76 through 26. Every 3 years starting at age 61 through age 76 or 93 with a history of 3 consecutive normal Pap tests.  HPV screening.** / Every 3 years from ages 37 through ages 60 to 51 with a history of 3 consecutive normal Pap tests.  Hepatitis C blood test.** / For any individual with known risks for hepatitis C.  Skin self-exam. / Monthly.  Influenza vaccine. / Every year.  Tetanus, diphtheria, and acellular pertussis (Tdap, Td) vaccine.** / Consult your health care provider. Pregnant women should receive 1 dose of Tdap vaccine during each pregnancy. 1 dose of Td every 10 years.  Varicella vaccine.** / Consult your health care provider. Pregnant females who do not have evidence of immunity should receive the first dose after pregnancy.  HPV vaccine. / 3 doses over 6 months, if 93 and younger. The vaccine is not recommended for use in pregnant females. However, pregnancy testing is not needed before receiving a dose.  Measles, mumps, rubella (MMR) vaccine.** / You need at least 1 dose of MMR if you were born in 1957 or later. You may also need a 2nd dose. For females of childbearing age, rubella immunity should be determined. If there is no evidence of immunity, females who are not pregnant should be vaccinated. If there is no evidence of immunity, females who are  pregnant should delay immunization until after pregnancy.  Pneumococcal  13-valent conjugate (PCV13) vaccine.** / Consult your health care provider.  Pneumococcal polysaccharide (PPSV23) vaccine.** / 1 to 2 doses if you smoke cigarettes or if you have certain conditions.  Meningococcal vaccine.** / 1 dose if you are age 68 to 8 years and a Market researcher living in a residence hall, or have one of several medical conditions, you need to get vaccinated against meningococcal disease. You may also need additional booster doses.  Hepatitis A vaccine.** / Consult your health care provider.  Hepatitis B vaccine.** / Consult your health care provider.  Haemophilus influenzae type b (Hib) vaccine.** / Consult your health care provider. Ages 7 to 53 years  Blood pressure check.** / Every year.  Lipid and cholesterol check.** / Every 5 years beginning at age 25 years.  Lung cancer screening. / Every year if you are aged 11-80 years and have a 30-pack-year history of smoking and currently smoke or have quit within the past 15 years. Yearly screening is stopped once you have quit smoking for at least 15 years or develop a health problem that would prevent you from having lung cancer treatment.  Clinical breast exam.** / Every year after age 48 years.  BRCA-related cancer risk assessment.** / For women who have family members with a BRCA-related cancer (breast, ovarian, tubal, or peritoneal cancers).  Mammogram.** / Every year beginning at age 41 years and continuing for as long as you are in good health. Consult with your health care provider.  Pap test.** / Every 3 years starting at age 65 years through age 37 or 70 years with a history of 3 consecutive normal Pap tests.  HPV screening.** / Every 3 years from ages 72 years through ages 60 to 40 years with a history of 3 consecutive normal Pap tests.  Fecal occult blood test (FOBT) of stool. / Every year beginning at age 21 years and continuing until age 5 years. You may not need to do this test if you get  a colonoscopy every 10 years.  Flexible sigmoidoscopy or colonoscopy.** / Every 5 years for a flexible sigmoidoscopy or every 10 years for a colonoscopy beginning at age 35 years and continuing until age 48 years.  Hepatitis C blood test.** / For all people born from [birth year] through 1965 and any individual with known risks for hepatitis C.  Skin self-exam. / Monthly.  Influenza vaccine. / Every year.  Tetanus, diphtheria, and acellular pertussis (Tdap/Td) vaccine.** / Consult your health care provider. Pregnant women should receive 1 dose of Tdap vaccine during each pregnancy. 1 dose of Td every 10 years.  Varicella vaccine.** / Consult your health care provider. Pregnant females who do not have evidence of immunity should receive the first dose after pregnancy.  Zoster vaccine.** / 1 dose for adults aged 30 years or older.  Measles, mumps, rubella (MMR) vaccine.** / You need at least 1 dose of MMR if you were born in 1957 or later. You may also need a second dose. For females of childbearing age, rubella immunity should be determined. If there is no evidence of immunity, females who are not pregnant should be vaccinated. If there is no evidence of immunity, females who are pregnant should delay immunization until after pregnancy.  Pneumococcal 13-valent conjugate (PCV13) vaccine.** / Consult your health care provider.  Pneumococcal polysaccharide (PPSV23) vaccine.** / 1 to 2 doses if you smoke cigarettes or if you have certain conditions.  Meningococcal vaccine.** /  Consult your health care provider.  Hepatitis A vaccine.** / Consult your health care provider.  Hepatitis B vaccine.** / Consult your health care provider.  Haemophilus influenzae type b (Hib) vaccine.** / Consult your health care provider. Ages 64 years and over  Blood pressure check.** / Every year.  Lipid and cholesterol check.** / Every 5 years beginning at age 23 years.  Lung cancer screening. / Every year if you  are aged 16-80 years and have a 30-pack-year history of smoking and currently smoke or have quit within the past 15 years. Yearly screening is stopped once you have quit smoking for at least 15 years or develop a health problem that would prevent you from having lung cancer treatment.  Clinical breast exam.** / Every year after age 74 years.  BRCA-related cancer risk assessment.** / For women who have family members with a BRCA-related cancer (breast, ovarian, tubal, or peritoneal cancers).  Mammogram.** / Every year beginning at age 44 years and continuing for as long as you are in good health. Consult with your health care provider.  Pap test.** / Every 3 years starting at age 58 years through age 22 or 39 years with 3 consecutive normal Pap tests. Testing can be stopped between 65 and 70 years with 3 consecutive normal Pap tests and no abnormal Pap or HPV tests in the past 10 years.  HPV screening.** / Every 3 years from ages 64 years through ages 70 or 61 years with a history of 3 consecutive normal Pap tests. Testing can be stopped between 65 and 70 years with 3 consecutive normal Pap tests and no abnormal Pap or HPV tests in the past 10 years.  Fecal occult blood test (FOBT) of stool. / Every year beginning at age 40 years and continuing until age 27 years. You may not need to do this test if you get a colonoscopy every 10 years.  Flexible sigmoidoscopy or colonoscopy.** / Every 5 years for a flexible sigmoidoscopy or every 10 years for a colonoscopy beginning at age 7 years and continuing until age 32 years.  Hepatitis C blood test.** / For all people born from [birth year] through 1966 and any individual with known risks for hepatitis C.  Osteoporosis screening.** / A one-time screening for women ages 30 years and over and women at risk for fractures or osteoporosis.  Skin self-exam. / Monthly.  Influenza vaccine. / Every year.  Tetanus, diphtheria, and acellular pertussis (Tdap/Td)  vaccine.** / 1 dose of Td every 10 years.  Varicella vaccine.** / Consult your health care provider.  Zoster vaccine.** / 1 dose for adults aged 35 years or older.  Pneumococcal 13-valent conjugate (PCV13) vaccine.** / Consult your health care provider.  Pneumococcal polysaccharide (PPSV23) vaccine.** / 1 dose for all adults aged 46 years and older.  Meningococcal vaccine.** / Consult your health care provider.  Hepatitis A vaccine.** / Consult your health care provider.  Hepatitis B vaccine.** / Consult your health care provider.  Haemophilus influenzae type b (Hib) vaccine.** / Consult your health care provider. ** Family history and personal history of risk and conditions may change your health care provider's recommendations.   This information is not intended to replace advice given to you by your health care provider. Make sure you discuss any questions you have with your health care provider.   Document Released: 03/25/2001 Document Revised: 02/17/2014 Document Reviewed: 06/24/2010 Elsevier Interactive Patient Education [employer].

## 2015-11-14 ENCOUNTER — Ambulatory Visit
Admission: RE | Admit: 2015-11-14 | Discharge: 2015-11-14 | Disposition: A | Discharge status: Home / Self Care | Payer: 59 | Source: Ambulatory Visit | Attending: Gastroenterology | Admitting: Gastroenterology

## 2015-11-14 ENCOUNTER — Ambulatory Visit (INDEPENDENT_AMBULATORY_CARE_PROVIDER_SITE_OTHER): Payer: 59 | Admitting: Psychology

## 2015-11-14 DIAGNOSIS — R1310 Dysphagia, unspecified: Secondary | ICD-10-CM

## 2015-11-14 DIAGNOSIS — F332 Major depressive disorder, recurrent severe without psychotic features: Secondary | ICD-10-CM | POA: Diagnosis not present

## 2015-11-14 DIAGNOSIS — F064 Anxiety disorder due to known physiological condition: Secondary | ICD-10-CM

## 2015-11-14 DIAGNOSIS — K219 Gastro-esophageal reflux disease without esophagitis: Secondary | ICD-10-CM | POA: Diagnosis not present

## 2015-11-14 LAB — LIPID PANEL
Cholesterol: 236 mg/dL — ABNORMAL HIGH (ref 0–200)
HDL: 49.7 mg/dL (ref >39.00)
LDL Cholesterol: 152 mg/dL — ABNORMAL HIGH (ref 0–99)
NonHDL: 186.6
Total CHOL/HDL Ratio: 5
Triglycerides: 171 mg/dL — ABNORMAL HIGH (ref 0.0–149.0)
VLDL: 34.2 mg/dL (ref 0.0–40.0)

## 2015-11-14 LAB — TSH: TSH: 2.15 u[IU]/mL (ref 0.35–4.50)

## 2015-11-15 ENCOUNTER — Telehealth: Payer: Self-pay

## 2015-11-15 MED FILL — TRETINOIN 0.05% CREAM: 0.05 | 90 days supply | Qty: 45 | Fill #0

## 2015-11-15 NOTE — Telephone Encounter (Signed)
PA initiated for tretinoin (RETIN-A) 0.05 % cream via telephone. PA approved 11/15/15 through 11/14/2016. PA # [id number].    KP

## 2015-11-20 MED FILL — GAVILYTE-G SOLUTION: 236 | 1 days supply | Qty: 4000 | Fill #0

## 2015-11-21 DIAGNOSIS — F3181 Bipolar II disorder: Secondary | ICD-10-CM | POA: Diagnosis not present

## 2015-11-23 MED FILL — DIVALPROEX SOD ER 250 MG TA: 250 | 30 days supply | Qty: 30 | Fill #0

## 2015-11-23 MED FILL — FINACEA 15% FOAM: 15 | 30 days supply | Qty: 50 | Fill #3

## 2015-11-27 ENCOUNTER — Ambulatory Visit (INDEPENDENT_AMBULATORY_CARE_PROVIDER_SITE_OTHER): Payer: 59 | Admitting: Psychology

## 2015-11-27 DIAGNOSIS — F064 Anxiety disorder due to known physiological condition: Secondary | ICD-10-CM | POA: Diagnosis not present

## 2015-11-27 DIAGNOSIS — F332 Major depressive disorder, recurrent severe without psychotic features: Secondary | ICD-10-CM

## 2015-11-28 DIAGNOSIS — R1310 Dysphagia, unspecified: Secondary | ICD-10-CM | POA: Diagnosis not present

## 2015-11-28 DIAGNOSIS — K449 Diaphragmatic hernia without obstruction or gangrene: Secondary | ICD-10-CM | POA: Diagnosis not present

## 2015-11-28 DIAGNOSIS — Z1211 Encounter for screening for malignant neoplasm of colon: Secondary | ICD-10-CM | POA: Diagnosis not present

## 2015-11-28 DIAGNOSIS — K317 Polyp of stomach and duodenum: Secondary | ICD-10-CM | POA: Diagnosis not present

## 2015-11-28 DIAGNOSIS — K228 Other specified diseases of esophagus: Secondary | ICD-10-CM | POA: Diagnosis not present

## 2015-11-28 DIAGNOSIS — K219 Gastro-esophageal reflux disease without esophagitis: Secondary | ICD-10-CM | POA: Diagnosis not present

## 2015-11-28 DIAGNOSIS — K5730 Diverticulosis of large intestine without perforation or abscess without bleeding: Secondary | ICD-10-CM | POA: Diagnosis not present

## 2015-12-07 ENCOUNTER — Other Ambulatory Visit: Payer: Self-pay | Admitting: Family Medicine

## 2015-12-07 MED FILL — ESOMEPRAZOLE MAG DR 40 MG C: 40 | 90 days supply | Qty: 90 | Fill #0

## 2015-12-07 MED FILL — XARELTO 20 MG TABLET: 20 | 30 days supply | Qty: 30 | Fill #3

## 2015-12-11 ENCOUNTER — Ambulatory Visit (INDEPENDENT_AMBULATORY_CARE_PROVIDER_SITE_OTHER): Payer: 59 | Admitting: Psychology

## 2015-12-11 ENCOUNTER — Telehealth: Payer: Self-pay | Admitting: Family Medicine

## 2015-12-11 DIAGNOSIS — F064 Anxiety disorder due to known physiological condition: Secondary | ICD-10-CM | POA: Diagnosis not present

## 2015-12-11 DIAGNOSIS — F332 Major depressive disorder, recurrent severe without psychotic features: Secondary | ICD-10-CM

## 2015-12-11 NOTE — Telephone Encounter (Signed)
Patient dropped of "Patient Insturctions After Upper GI Endoscopy Form"

## 2015-12-17 ENCOUNTER — Other Ambulatory Visit: Payer: Self-pay | Admitting: Family Medicine

## 2015-12-17 DIAGNOSIS — L700 Acne vulgaris: Secondary | ICD-10-CM

## 2015-12-17 MED FILL — FINACEA 15% FOAM: 15 | 30 days supply | Qty: 50 | Fill #0

## 2015-12-18 DIAGNOSIS — F3181 Bipolar II disorder: Secondary | ICD-10-CM | POA: Diagnosis not present

## 2015-12-18 NOTE — Progress Notes (Signed)
Chief Complaint  Patient presents with  . Palpitations    History of Present Illness: 55 yo WF with history of HLD, HTN, Bipolar disorder, IBS, Mitral valve prolapse, bilateral PE (December 2016) here today for cardiac follow up. She was seen as a new patient 10/08/11 to establish cardiology care. She was diagnosed with MVP in the 1980s. She told me that she had been having sharp pains in her chest, occasionally associated with SOB. These episodes lasted for a few minutes. She was seen in the ED at [facility] 08/11/11 and had a coronary CT 08/12/11 which showed normal coronary arteries. Also notes skipped heart beats several times per week. I ordered an echo which showed normal LV size and function, mild MR. A 48 hour Holter monitor showed NSR with bradycardia, PACs and runs of SVT with short run of atrial fibrillation. She was seen in EP clinic by Dr. Rayann Heman October 2013 and he felt that this was most likely a long RP tachycardia. He did not recommend a change in therapy but suggested Flecainide if symptoms worsened and EP f/u only if there were changes. She was last seen in EP clinic in January 2014. She felt that her Toprol was causing hallucinations so she was started on Cardizem and Toprol stopped November 2014. She was found to have bilateral PE in December 2016 and has been followed in Oncology. She has been treated with Xarelto. She was seen in our office July 2017 by Melina Copa, PA-C following an ED visit for chest pain. Troponin negative. Reportedly had PVCs on the monitor in the ED. Chest CTA without evidence of recurrent PE. Nuclear stress test August 2017 with no evidence of ischemia. LVEF=69%.   She is here for follow up. She tells me that she has had rare palpitations but none lasting more than a few minutes. She has no chest pains. No SOB, near syncope, lower ext edema.   Primary Care Physician: Ann Held, DO   Past Medical History:  Diagnosis Date  . Anxiety   . Atrial  fibrillation ([place])    a. Event monitor 2013 - PACs/bradycardia/SVT/short run of atrial fib by event monitor.   Marland Kitchen BIPOLAR DISORDER UNSPECIFIED   . Bradycardia   . Bursitis of hip 09/1999   Bilateral - Dr. Berenice Primas  . DEPRESSION   . Fistula of female genitalia 03/29/2007   enterovesical fistula, not reapired  . GERD   . Headache(784.0)    was with menstrual cycle. no longer a problem  . HYPERLIPIDEMIA   . Hypertension   . Hypertension   . IRRITABLE BOWEL SYNDROME, HX OF   . Menopausal state 01/2012   FSH = 88.5  . Mitral valve prolapse 12/17/2000   a. dx 1980s, most recent echo did not demonstrate this.  Marland Kitchen MYALGIA   . Normal coronary arteries    a. by cardiac CT 2013.  Marland Kitchen PAT (paroxysmal atrial tachycardia) ([place])   . PE (pulmonary embolism) 02/09/2015   a. Bilateral PEs 01/2015 when d-dimer 0.69, CP lying on left side. Followed by heme-onc - + lupus anticoagulant, mildly depressed protein S. Dr. Marin Olp is not certain if her hypercoagulable studies are significant for a thrombophilic state.  . Premature atrial contractions   . Shortness of breath    Pulmonary eval 05/2002  . Thyroid nodule 2014  . Tremors of nervous system     Past Surgical History:  Procedure Laterality Date  . COLONOSCOPY    . Lipoma (R) side  1990   Right back    Current Outpatient Prescriptions  Medication Sig Dispense Refill  . ALPRAZolam (XANAX) 0.5 MG tablet Take 0.25-0.5 mg by mouth at bedtime as needed for sleep.    Marland Kitchen aspirin EC 81 MG tablet Take 81 mg by mouth daily.    Marland Kitchen BIOTIN PO Take 1 tablet by mouth daily.    . Cholecalciferol (VITAMIN D3) 1000 UNITS CAPS Take 1,000 Units by mouth daily.     Marland Kitchen diltiazem (CARTIA XT) 180 MG 24 hr capsule TAKE 1 CAPSULE (180 MG TOTAL) BY MOUTH DAILY. 90 capsule 1  . divalproex (DEPAKOTE) 500 MG 24 hr tablet Take 1,000 mg by mouth every morning.     Marland Kitchen esomeprazole (NEXIUM) 40 MG capsule TAKE 1 CAPSULE BY MOUTH AT BEDTIME. 90 capsule 3  . FINACEA 15 % FOAM APPLY  TOPICALLY DAILY AS NEEDED 50 g 3  . fluticasone (FLONASE) 50 MCG/ACT nasal spray Place 2 sprays into the nose daily as needed. For allergies    . lidocaine (XYLOCAINE) 2 % jelly Apply topically as needed (for dysparunia). 30 mL 12  . nitrofurantoin, macrocrystal-monohydrate, (MACROBID) 100 MG capsule Take 1 capsule (100 mg total) by mouth once as needed (post coital). 30 capsule 8  . pramipexole (MIRAPEX) 0.125 MG tablet Take 0.375 mg by mouth daily. TAKE 3 TABS DAILY  0  . Probiotic Product (PROBIOTIC DAILY PO) Take 1 tablet by mouth daily.     . propranolol (INDERAL) 10 MG tablet Take 20 mg by mouth daily as needed (shakiness). Reported on 05/17/2015  1  . traZODone (DESYREL) 50 MG tablet Take 1 tablet by mouth at bedtime.  0  . tretinoin (RETIN-A) 0.05 % cream Apply topically at bedtime. 45 g 3  . XARELTO 20 MG TABS tablet TAKE 1 TABLET BY MOUTH DAILY WITH SUPPER 30 tablet 5   No current facility-administered medications for this visit.     Allergies  Allergen Reactions  . Cariprazine Other (See Comments)    Patient becomes suicidal when taking this medication.   . Lopressor [Metoprolol] Other (See Comments)    Hallucinations hair falls out  . Nickel Rash    Social History   Social History  . Marital status: Married    Spouse name: N/A  . Number of children: 0  . Years of education: N/A   Occupational History  . 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 Smoking status: Former Smoker    Packs/day: 1.00    Years: 25.00    Types: Cigarettes    Quit date: 02/11/1996  . Smokeless tobacco: Never Used     Comment: Married, lives with spouse. 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 Alcohol use 1.8 oz/week    3 Standard drinks or equivalent per week  . Drug use: No  . Sexual activity: Yes    Partners: Male    Birth control/ protection: Post-menopausal   Other Topics Concern  . Not on file   Social History Narrative   Married, lives with spouse  in [place]. 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 Lung cancer Mother   . Hypertension Mother   . Hyperlipidemia Mother   . Cancer Father     Esophageal cancer  . Hyperlipidemia Father   . Depression Father   .  Cancer Brother   . Colon cancer Paternal Uncle   . Sarcoidosis Other   . Testicular cancer Other     Review of Systems:  As stated in the HPI and otherwise negative.   BP 102/70   Pulse 84   Ht 5\' 9"  (1.753 m)   Wt 168 lb (76.2 kg)   LMP 05/11/2012   BMI 24.81 kg/m   Physical Examination: General: Well developed, well nourished, NAD  HEENT: OP clear, mucus membranes moist  SKIN: warm, dry. No rashes. Neuro: No focal deficits  Musculoskeletal: Muscle strength 5/5 all ext  Psychiatric: Mood and affect normal  Neck: No JVD, no carotid bruits, no thyromegaly, no lymphadenopathy.  Lungs:Clear bilaterally, no wheezes, rhonci, crackles Cardiovascular: Regular rate and rhythm. No murmurs, gallops or rubs. Abdomen:Soft. Bowel sounds present. Non-tender.  Extremities: No lower extremity edema. Pulses are 2 + in the bilateral DP/PT.  EKG:  EKG is not ordered today. The ekg ordered today demonstrates  Recent Labs: 02/09/2015: B Natriuretic Peptide 11.4 10/17/2015: ALT(SGPT) 22; BUN, Bld 10; Creat 0.9; HGB 14.0; Platelets 191; Potassium 3.5; Sodium 134 11/13/2015: TSH 2.15   Lipid Panel    Component Value Date/Time   CHOL 236 (H) 11/13/2015 1500   TRIG 171.0 (H) 11/13/2015 1500   HDL 49.70 11/13/2015 1500   CHOLHDL 5 11/13/2015 1500   VLDL 34.2 11/13/2015 1500   LDLCALC 152 (H) 11/13/2015 1500   LDLDIRECT 212.0 10/31/2014 1408     Wt Readings from Last 3 Encounters:  12/19/15 168 lb (76.2 kg)  11/13/15 168 lb (76.2 kg)  10/17/15 171 lb (77.6 kg)     Other studies Reviewed: Additional studies/ records that were reviewed today include: . Review of  the above records demonstrates:    Assessment and Plan:   1. SVT: She is known to have a long RP tachycardia. Tolerating Cardizem.  Rare palpitations.   2. Mitral regurgitation: mild by echo September 2013. Repeat echo now.   3. Chest pain: Atypical. Occurs rarely. Stress test August Occurs only when lying on left side. Does not sound cardiac.  4. HTN: BP controlled. No changes.    5. History of PE: on Xarelto. Followed by heme/onc clinic.   Current medicines are reviewed at length with the patient today.  The patient does not have concerns regarding medicines.  The following changes have been made:  no change  Labs/ tests ordered today include:   Orders Placed This Encounter  Procedures  . ECHOCARDIOGRAM COMPLETE    Disposition:   FU with me in 24 months  Signed, Lauree Chandler, MD 12/19/2015 10:38 AM    [facility] [address], [place], [place]  [address] Phone: [phone]; Fax: [phone]

## 2015-12-19 ENCOUNTER — Encounter: Payer: Self-pay | Admitting: Cardiovascular Disease

## 2015-12-19 ENCOUNTER — Ambulatory Visit (INDEPENDENT_AMBULATORY_CARE_PROVIDER_SITE_OTHER): Payer: 59 | Admitting: Cardiovascular Disease

## 2015-12-19 VITALS — BP 102/70 | HR 84 | Ht 69.0 in | Wt 168.0 lb

## 2015-12-19 DIAGNOSIS — I471 Supraventricular tachycardia: Secondary | ICD-10-CM

## 2015-12-19 DIAGNOSIS — I340 Nonrheumatic mitral (valve) insufficiency: Secondary | ICD-10-CM

## 2015-12-19 DIAGNOSIS — I10 Essential (primary) hypertension: Secondary | ICD-10-CM | POA: Diagnosis not present

## 2015-12-19 MED FILL — QUETIAPINE ER 50 MG TABLET: 50 | 18 days supply | Qty: 60 | Fill #0

## 2015-12-19 NOTE — Patient Instructions (Signed)
Medication Instructions:  Your physician recommends that you continue on your current medications as directed. Please refer to the Current Medication list given to you today.   Labwork: none  Testing/Procedures: Your physician has requested that you have an echocardiogram. Echocardiography is a painless test that uses sound waves to create images of your heart. It provides your doctor with information about the size and shape of your heart and how well your heart's chambers and valves are working. This procedure takes approximately one hour. There are no restrictions for this procedure.    Follow-Up: Your physician recommends that you schedule a follow-up appointment in: 12 months. Please call our office in about 9 months to schedule this appointment    Any Other Special Instructions Will Be Listed Below (If Applicable).     If you need a refill on your cardiac medications before your next appointment, please call your pharmacy.   

## 2015-12-25 ENCOUNTER — Ambulatory Visit (INDEPENDENT_AMBULATORY_CARE_PROVIDER_SITE_OTHER): Payer: 59 | Admitting: Psychology

## 2015-12-25 DIAGNOSIS — F332 Major depressive disorder, recurrent severe without psychotic features: Secondary | ICD-10-CM

## 2015-12-25 DIAGNOSIS — F064 Anxiety disorder due to known physiological condition: Secondary | ICD-10-CM

## 2015-12-31 DIAGNOSIS — F3181 Bipolar II disorder: Secondary | ICD-10-CM | POA: Diagnosis not present

## 2016-01-01 ENCOUNTER — Other Ambulatory Visit: Payer: Self-pay | Admitting: Family Medicine

## 2016-01-01 ENCOUNTER — Telehealth: Payer: Self-pay | Admitting: Family Medicine

## 2016-01-01 DIAGNOSIS — E049 Nontoxic goiter, unspecified: Secondary | ICD-10-CM

## 2016-01-01 MED FILL — QUETIAPINE ER 300 MG TABLET: 300 | 30 days supply | Qty: 30 | Fill #0

## 2016-01-01 NOTE — Telephone Encounter (Signed)
Pt says for the last 2/3 months her thyroid gland has been swollen. Pt says that she has had a EDG and also a Barium Swallow test completed that didn't show any concerns. Pt would like to know if provider could order a ultra sound for her?   Please advise.

## 2016-01-01 NOTE — Telephone Encounter (Signed)
Order in.

## 2016-01-02 NOTE — Telephone Encounter (Signed)
Called patient and left a detailed message that her order was in ans she should be expecting a phone call possibly after the holidays.  PC

## 2016-01-07 MED FILL — XARELTO 20 MG TABLET: 20 | 30 days supply | Qty: 30 | Fill #4

## 2016-01-08 ENCOUNTER — Ambulatory Visit (INDEPENDENT_AMBULATORY_CARE_PROVIDER_SITE_OTHER): Payer: 59 | Admitting: Psychology

## 2016-01-08 ENCOUNTER — Ambulatory Visit (HOSPITAL_BASED_OUTPATIENT_CLINIC_OR_DEPARTMENT_OTHER)
Admission: RE | Admit: 2016-01-08 | Discharge: 2016-01-08 | Disposition: A | Discharge status: Home / Self Care | Payer: 59 | Source: Ambulatory Visit | Attending: Family Medicine | Admitting: Family Medicine

## 2016-01-08 DIAGNOSIS — E049 Nontoxic goiter, unspecified: Secondary | ICD-10-CM

## 2016-01-08 DIAGNOSIS — F332 Major depressive disorder, recurrent severe without psychotic features: Secondary | ICD-10-CM | POA: Diagnosis not present

## 2016-01-08 DIAGNOSIS — E010 Iodine-deficiency related diffuse (endemic) goiter: Secondary | ICD-10-CM | POA: Diagnosis not present

## 2016-01-10 DIAGNOSIS — Z800 Family history of malignant neoplasm of digestive organs: Secondary | ICD-10-CM | POA: Diagnosis not present

## 2016-01-10 DIAGNOSIS — K219 Gastro-esophageal reflux disease without esophagitis: Secondary | ICD-10-CM | POA: Diagnosis not present

## 2016-01-14 ENCOUNTER — Telehealth: Payer: Self-pay | Admitting: Cardiovascular Disease

## 2016-01-14 MED FILL — QUETIAPINE ER 400 MG TABLET: 400 | 30 days supply | Qty: 30 | Fill #0

## 2016-01-14 NOTE — Telephone Encounter (Signed)
Left message to call back  

## 2016-01-14 NOTE — Telephone Encounter (Signed)
New message;    Pt is scheduled for an echo tomorrow. Pt wants to know what her diagnosis is for her insurance company please.-Did not need this encounter.

## 2016-01-14 NOTE — Telephone Encounter (Signed)
I spoke with pt and told her prior Josem Kaufmann was not needed for echo

## 2016-01-15 ENCOUNTER — Ambulatory Visit (HOSPITAL_COMMUNITY): Payer: 59 | Attending: Cardiology

## 2016-01-15 ENCOUNTER — Other Ambulatory Visit: Payer: Self-pay

## 2016-01-15 DIAGNOSIS — I340 Nonrheumatic mitral (valve) insufficiency: Secondary | ICD-10-CM | POA: Insufficient documentation

## 2016-01-15 DIAGNOSIS — I501 Left ventricular failure: Secondary | ICD-10-CM | POA: Insufficient documentation

## 2016-01-16 ENCOUNTER — Other Ambulatory Visit (HOSPITAL_BASED_OUTPATIENT_CLINIC_OR_DEPARTMENT_OTHER): Payer: 59

## 2016-01-16 ENCOUNTER — Ambulatory Visit (HOSPITAL_BASED_OUTPATIENT_CLINIC_OR_DEPARTMENT_OTHER): Payer: 59 | Admitting: Hematology & Oncology

## 2016-01-16 VITALS — BP 105/53 | HR 89 | Temp 97.6°F | Resp 20 | Wt 172.0 lb

## 2016-01-16 DIAGNOSIS — D6862 Lupus anticoagulant syndrome: Secondary | ICD-10-CM

## 2016-01-16 DIAGNOSIS — Z7901 Long term (current) use of anticoagulants: Secondary | ICD-10-CM

## 2016-01-16 DIAGNOSIS — I2699 Other pulmonary embolism without acute cor pulmonale: Secondary | ICD-10-CM

## 2016-01-16 DIAGNOSIS — Z86711 Personal history of pulmonary embolism: Secondary | ICD-10-CM

## 2016-01-16 DIAGNOSIS — I2782 Chronic pulmonary embolism: Secondary | ICD-10-CM

## 2016-01-16 DIAGNOSIS — Z7982 Long term (current) use of aspirin: Secondary | ICD-10-CM | POA: Diagnosis not present

## 2016-01-16 LAB — COMPREHENSIVE METABOLIC PANEL
ALT: 24 U/L (ref 0–55)
AST: 16 U/L (ref 5–34)
Albumin: 3.6 g/dL (ref 3.5–5.0)
Alkaline Phosphatase: 81 U/L (ref 40–150)
Anion Gap: 12 mEq/L — ABNORMAL HIGH (ref 3–11)
BUN: 12.6 mg/dL (ref 7.0–26.0)
CO2: 20 mEq/L — ABNORMAL LOW (ref 22–29)
Calcium: 9.5 mg/dL (ref 8.4–10.4)
Chloride: 110 mEq/L — ABNORMAL HIGH (ref 98–109)
Creatinine: 0.9 mg/dL (ref 0.6–1.1)
EGFR: 75 mL/min/{1.73_m2} — ABNORMAL LOW (ref >90)
Glucose: 115 mg/dl (ref 70–140)
Potassium: 3.3 mEq/L — ABNORMAL LOW (ref 3.5–5.1)
Sodium: 143 mEq/L (ref 136–145)
Total Bilirubin: 0.59 mg/dL (ref 0.20–1.20)
Total Protein: 7.1 g/dL (ref 6.4–8.3)

## 2016-01-16 LAB — CBC WITH DIFFERENTIAL (CANCER CENTER ONLY)
BASO#: 0 10*3/uL (ref 0.0–0.2)
BASO%: 0.7 % (ref 0.0–2.0)
EOS%: 2.8 % (ref 0.0–7.0)
Eosinophils Absolute: 0.2 10*3/uL (ref 0.0–0.5)
HCT: 36.9 % (ref 34.8–46.6)
HGB: 12.7 g/dL (ref 11.6–15.9)
LYMPH#: 2.4 10*3/uL (ref 0.9–3.3)
LYMPH%: 42.7 % (ref 14.0–48.0)
MCH: 29.2 pg (ref 26.0–34.0)
MCHC: 34.4 g/dL (ref 32.0–36.0)
MCV: 85 fL (ref 81–101)
MONO#: 0.3 10*3/uL (ref 0.1–0.9)
MONO%: 5.4 % (ref 0.0–13.0)
NEUT#: 2.8 10*3/uL (ref 1.5–6.5)
NEUT%: 48.4 % (ref 39.6–80.0)
Platelets: 240 10*3/uL (ref 145–400)
RBC: 4.35 10*6/uL (ref 3.70–5.32)
RDW: 14.1 % (ref 11.1–15.7)
WBC: 5.7 10*3/uL (ref 3.9–10.0)

## 2016-01-16 NOTE — Progress Notes (Signed)
Hematology and Oncology Follow Up Visit  Beverly Cole [id number] 1960/05/25 55 y.o. 01/16/2016   Principle Diagnosis:   Bilateral pulmonary embolism  Positive lupus anticoagulant  Mildly depressed protein S level  Current Therapy:    Xarelto 20 mg by mouth daily-finish 1 year of therapy in December 2017  Xarelto 10 mg by mouth daily-to complete 1 year in December 2018  EC ASA 81 mg po q day     Interim History:  Beverly Cole is back for follow-up. She is doing quite well. She feels okay. She's had no positive with the Xarelto. She's been on full dose Xarelto for one year. We will get her on 10 mg dose now. I think this would be reasonable. She says that her family doctor prescribes this.   She is to forward to going to [place] in April. She has been there 10 times. It is her anniversary.   We last saw her, she still had a lupus anticoagulant. As such, we now have her on aspirin.   She's had no bleeding. She's had no fever. She's had no leg swelling. She's had no cough or shortness of breath. There has been no chest wall pain. .  Currently, her performance status is ECOG 0.  Medications:  Current Outpatient Prescriptions:  .  ALPRAZolam (XANAX) 0.5 MG tablet, Take 0.25-0.5 mg by mouth at bedtime as needed for sleep., Disp: , Rfl:  .  aspirin EC 81 MG tablet, Take 81 mg by mouth daily., Disp: , Rfl:  .  BIOTIN PO, Take 1 tablet by mouth daily., Disp: , Rfl:  .  Cholecalciferol (VITAMIN D3) 1000 UNITS CAPS, Take 1,000 Units by mouth daily. , Disp: , Rfl:  .  dexlansoprazole (DEXILANT) 60 MG capsule, Take 60 mg by mouth daily., Disp: , Rfl:  .  diltiazem (CARTIA XT) 180 MG 24 hr capsule, TAKE 1 CAPSULE (180 MG TOTAL) BY MOUTH DAILY., Disp: 90 capsule, Rfl: 1 .  FINACEA 15 % FOAM, APPLY TOPICALLY DAILY AS NEEDED, Disp: 50 g, Rfl: 3 .  fluticasone (FLONASE) 50 MCG/ACT nasal spray, Place 2 sprays into the nose daily as needed. For allergies, Disp: , Rfl:  .  lidocaine (XYLOCAINE) 2 %  jelly, Apply topically as needed (for dysparunia)., Disp: 30 mL, Rfl: 12 .  nitrofurantoin, macrocrystal-monohydrate, (MACROBID) 100 MG capsule, Take 1 capsule (100 mg total) by mouth once as needed (post coital)., Disp: 30 capsule, Rfl: 8 .  pramipexole (MIRAPEX) 0.25 MG tablet, Take 0.5 mg by mouth at bedtime. , Disp: , Rfl: 0 .  Probiotic Product (PROBIOTIC DAILY PO), Take 1 tablet by mouth daily. , Disp: , Rfl:  .  QUEtiapine (SEROQUEL XR) 400 MG 24 hr tablet, Take 400 mg by mouth at bedtime., Disp: , Rfl: 0 .  tretinoin (RETIN-A) 0.05 % cream, Apply topically at bedtime., Disp: 45 g, Rfl: 3 .  XARELTO 20 MG TABS tablet, TAKE 1 TABLET BY MOUTH DAILY WITH SUPPER, Disp: 30 tablet, Rfl: 5 .  propranolol (INDERAL) 10 MG tablet, Take 20 mg by mouth daily as needed (shakiness). Reported on 05/17/2015, Disp: , Rfl: 1  Allergies:  Allergies  Allergen Reactions  . Cariprazine Other (See Comments)    Patient becomes suicidal when taking this medication.   . Lopressor [Metoprolol] Other (See Comments)    Hallucinations hair falls out  . Nickel Rash    Past Medical History, Surgical history, Social history, and Family History were reviewed and updated.  Review of Systems: As  above  Physical Exam:  weight is 171 lb 15.7 oz (78 kg). Her oral temperature is 97.6 F (36.4 C). Her blood pressure is 105/53 (abnormal) and her pulse is 89. Her respiration is 20.   Wt Readings from Last 3 Encounters:  01/16/16 171 lb 15.7 oz (78 kg)  12/19/15 168 lb (76.2 kg)  11/13/15 168 lb (76.2 kg)     Well-developed and well-nourished white female in no obvious distress. Head exam shows no ocular or oral lesions. She has no palpable cervical or supraclavicular lymph nodes. Lungs are clear bilaterally. Cardiac exam regular rate and rhythm with no murmurs, rubs or bruits. Abdomen is soft. She has good bowel sounds. There is no fluid wave. There is no palpable liver or spleen tip. Back exam shows no tenderness over  the spine, ribs or hips. Extremities shows no clubbing, cyanosis or edema. No venous cords noted in her legs. She is a negative Homans sign bilaterally. Skin exam shows no rashes, ecchymoses or petechia.  Lab Results  Component Value Date   WBC 5.7 01/16/2016   HGB 12.7 01/16/2016   HCT 36.9 01/16/2016   MCV 85 01/16/2016   PLT 240 01/16/2016     Chemistry      Component Value Date/Time   NA 143 01/16/2016 1005   K 3.3 (L) 01/16/2016 1005   CL 105 10/17/2015 1033   CO2 20 (L) 01/16/2016 1005   BUN 12.6 01/16/2016 1005   CREATININE 0.9 01/16/2016 1005      Component Value Date/Time   CALCIUM 9.5 01/16/2016 1005   ALKPHOS 81 01/16/2016 1005   AST 16 01/16/2016 1005   ALT 24 01/16/2016 1005   BILITOT 0.59 01/16/2016 1005         Impression and Plan: Beverly Cole is a 55 year old white female. She had a pulmonary embolism. This was a bilateral pulmonary embolism. She had been on hormonal replacement therapy.  We will now get her on low-dose Xarelto. We will get her on for one year. I think this would be reasonable. I think this would be very helpful.  She is taking baby aspirin. She does have the lupus anticoagulant.  I would like to see her back in about 6 months. She will be going to [place] in April for her anniversary. She really likes going to [place].   Volanda Napoleon, MD 12/6/20175:06 PM

## 2016-01-17 DIAGNOSIS — Z6824 Body mass index (BMI) 24.0-24.9, adult: Secondary | ICD-10-CM | POA: Diagnosis not present

## 2016-01-17 DIAGNOSIS — R760 Raised antibody titer: Secondary | ICD-10-CM | POA: Diagnosis not present

## 2016-01-17 DIAGNOSIS — M2550 Pain in unspecified joint: Secondary | ICD-10-CM | POA: Diagnosis not present

## 2016-01-17 LAB — D-DIMER, QUANTITATIVE (NOT AT ARMC): D-DIMER: <0.2 mg/L FEU (ref 0.00–0.49)

## 2016-01-23 ENCOUNTER — Ambulatory Visit (INDEPENDENT_AMBULATORY_CARE_PROVIDER_SITE_OTHER): Payer: 59 | Admitting: Psychology

## 2016-01-23 DIAGNOSIS — F332 Major depressive disorder, recurrent severe without psychotic features: Secondary | ICD-10-CM | POA: Diagnosis not present

## 2016-01-23 DIAGNOSIS — F064 Anxiety disorder due to known physiological condition: Secondary | ICD-10-CM

## 2016-01-24 MED FILL — DILTIAZEM 24HR ER 180 MG CA: 180 | 90 days supply | Qty: 90 | Fill #1

## 2016-01-28 ENCOUNTER — Other Ambulatory Visit: Payer: Self-pay | Admitting: Family Medicine

## 2016-01-28 DIAGNOSIS — I2699 Other pulmonary embolism without acute cor pulmonale: Secondary | ICD-10-CM

## 2016-01-28 MED ORDER — RIVAROXABAN 20 MG PO TABS: 20.0000 mg | ORAL_TABLET | Freq: Every day | ORAL | 1 refills | Status: DC

## 2016-01-28 MED FILL — DEXILANT DR 60 MG CAPSULE: 60 | 90 days supply | Qty: 90 | Fill #0

## 2016-01-28 MED FILL — XARELTO 20 MG TABLET: 20 | 90 days supply | Qty: 90 | Fill #0

## 2016-01-28 NOTE — Telephone Encounter (Signed)
Refill done as requested 

## 2016-01-28 NOTE — Telephone Encounter (Signed)
Patient called and request a 3 month supply of XARELTO 20 MG TABS tablet Please advise   Pharmacy: [place], [place] - [address] [facility].

## 2016-01-31 DIAGNOSIS — F3181 Bipolar II disorder: Secondary | ICD-10-CM | POA: Diagnosis not present

## 2016-02-01 MED FILL — PRAMIPEXOLE 0.25 MG TABLET: 0.25 | 90 days supply | Qty: 270 | Fill #0

## 2016-02-01 MED FILL — QUETIAPINE ER 300 MG TABLET: 300 | 30 days supply | Qty: 60 | Fill #0

## 2016-02-05 MED FILL — TRETINOIN 0.05% CREAM: 0.05 | 90 days supply | Qty: 45 | Fill #1

## 2016-02-13 DIAGNOSIS — K219 Gastro-esophageal reflux disease without esophagitis: Secondary | ICD-10-CM | POA: Insufficient documentation

## 2016-02-13 DIAGNOSIS — R070 Pain in throat: Secondary | ICD-10-CM | POA: Diagnosis not present

## 2016-02-27 ENCOUNTER — Ambulatory Visit: Payer: 59 | Admitting: Psychology

## 2016-03-12 ENCOUNTER — Ambulatory Visit (INDEPENDENT_AMBULATORY_CARE_PROVIDER_SITE_OTHER): Payer: 59 | Admitting: Psychology

## 2016-03-12 DIAGNOSIS — F332 Major depressive disorder, recurrent severe without psychotic features: Secondary | ICD-10-CM

## 2016-03-13 DIAGNOSIS — F3181 Bipolar II disorder: Secondary | ICD-10-CM | POA: Diagnosis not present

## 2016-03-18 ENCOUNTER — Ambulatory Visit: Payer: 59 | Admitting: Psychology

## 2016-03-19 MED FILL — QUETIAPINE ER 300 MG TABLET: 300 | 90 days supply | Qty: 90 | Fill #0

## 2016-03-19 MED FILL — DIVALPROEX SOD ER 500 MG TA: 500 | 90 days supply | Qty: 180 | Fill #0

## 2016-04-01 DIAGNOSIS — H5213 Myopia, bilateral: Secondary | ICD-10-CM | POA: Diagnosis not present

## 2016-04-09 ENCOUNTER — Ambulatory Visit (INDEPENDENT_AMBULATORY_CARE_PROVIDER_SITE_OTHER): Payer: 59 | Admitting: Psychology

## 2016-04-09 DIAGNOSIS — F332 Major depressive disorder, recurrent severe without psychotic features: Secondary | ICD-10-CM

## 2016-04-10 ENCOUNTER — Telehealth: Payer: Self-pay | Admitting: Family Medicine

## 2016-04-10 DIAGNOSIS — I2782 Chronic pulmonary embolism: Secondary | ICD-10-CM

## 2016-04-10 NOTE — Telephone Encounter (Signed)
Caller name: Relationship to patient: Self Can be reached: [phone] Pharmacy:  Reason for call: Patient no longer desires to see Dr. Jonette Eva and would like to be referred to another Hematologist

## 2016-04-10 NOTE — Telephone Encounter (Signed)
Ok to refer to hematology--does she have a preference as to where?  [address] [place], [place]?

## 2016-04-10 NOTE — Telephone Encounter (Signed)
Please advise 

## 2016-04-10 NOTE — Telephone Encounter (Signed)
Spoke w/ Pt, Pt requesting to stay in [facility] system but would like to see another hematologist besides Dr. Marin Olp. Referral placed.

## 2016-04-10 NOTE — Addendum Note (Signed)
Addended byDamita Dunnings D on: 04/10/2016 04:20 PM   Modules accepted: Orders

## 2016-04-11 MED FILL — PRAMIPEXOLE 1 MG TABLET: 1 | 90 days supply | Qty: 90 | Fill #0

## 2016-04-18 MED FILL — DEXILANT DR 60 MG CAPSULE: 60 | 30 days supply | Qty: 30 | Fill #1

## 2016-04-25 ENCOUNTER — Other Ambulatory Visit: Payer: Self-pay | Admitting: Family Medicine

## 2016-04-25 ENCOUNTER — Ambulatory Visit (INDEPENDENT_AMBULATORY_CARE_PROVIDER_SITE_OTHER): Payer: 59 | Admitting: Family Medicine

## 2016-04-25 ENCOUNTER — Telehealth: Payer: Self-pay | Admitting: Family Medicine

## 2016-04-25 ENCOUNTER — Ambulatory Visit: Payer: Self-pay | Admitting: Medical

## 2016-04-25 ENCOUNTER — Encounter (HOSPITAL_BASED_OUTPATIENT_CLINIC_OR_DEPARTMENT_OTHER): Payer: Self-pay

## 2016-04-25 ENCOUNTER — Ambulatory Visit (HOSPITAL_BASED_OUTPATIENT_CLINIC_OR_DEPARTMENT_OTHER)
Admission: RE | Admit: 2016-04-25 | Discharge: 2016-04-25 | Disposition: A | Discharge status: Home / Self Care | Payer: 59 | Source: Ambulatory Visit | Attending: Family Medicine | Admitting: Family Medicine

## 2016-04-25 VITALS — BP 126/72 | HR 88 | Temp 98.2°F | Wt 167.0 lb

## 2016-04-25 DIAGNOSIS — R079 Chest pain, unspecified: Secondary | ICD-10-CM

## 2016-04-25 DIAGNOSIS — K760 Fatty (change of) liver, not elsewhere classified: Secondary | ICD-10-CM | POA: Diagnosis not present

## 2016-04-25 DIAGNOSIS — R002 Palpitations: Secondary | ICD-10-CM

## 2016-04-25 DIAGNOSIS — I2782 Chronic pulmonary embolism: Secondary | ICD-10-CM

## 2016-04-25 DIAGNOSIS — J90 Pleural effusion, not elsewhere classified: Secondary | ICD-10-CM | POA: Diagnosis not present

## 2016-04-25 DIAGNOSIS — Z86711 Personal history of pulmonary embolism: Secondary | ICD-10-CM | POA: Diagnosis not present

## 2016-04-25 DIAGNOSIS — R0602 Shortness of breath: Secondary | ICD-10-CM

## 2016-04-25 DIAGNOSIS — I313 Pericardial effusion (noninflammatory): Secondary | ICD-10-CM | POA: Diagnosis not present

## 2016-04-25 LAB — COMPREHENSIVE METABOLIC PANEL
ALT: 11 U/L (ref 6–29)
AST: 15 U/L (ref 10–35)
Albumin: 4.1 g/dL (ref 3.6–5.1)
Alkaline Phosphatase: 65 U/L (ref 33–130)
BUN: 14 mg/dL (ref 7–25)
CO2: 22 mmol/L (ref 20–31)
Calcium: 9.1 mg/dL (ref 8.6–10.4)
Chloride: 107 mmol/L (ref 98–110)
Creat: 0.75 mg/dL (ref 0.50–1.05)
Glucose, Bld: 80 mg/dL (ref 65–99)
Potassium: 3.8 mmol/L (ref 3.5–5.3)
Sodium: 142 mmol/L (ref 135–146)
Total Bilirubin: 0.3 mg/dL (ref 0.2–1.2)
Total Protein: 7.1 g/dL (ref 6.1–8.1)

## 2016-04-25 LAB — CBC
HCT: 40.5 % (ref 35.0–45.0)
Hemoglobin: 13.2 g/dL (ref 11.7–15.5)
MCH: 28 pg (ref 27.0–33.0)
MCHC: 32.6 g/dL (ref 32.0–36.0)
MCV: 86 fL (ref 80.0–100.0)
MPV: 9 fL (ref 7.5–12.5)
Platelets: 195 10*3/uL (ref 140–400)
RBC: 4.71 MIL/uL (ref 3.80–5.10)
RDW: 15.1 % — ABNORMAL HIGH (ref 11.0–15.0)
WBC: 5.8 10*3/uL (ref 3.8–10.8)

## 2016-04-25 MED ORDER — IOPAMIDOL (ISOVUE-370) INJECTION 76%
100.0000 mL | Freq: Once | INTRAVENOUS | Status: AC | PRN
  Administered 2016-04-25: 100 mL via INTRAVENOUS

## 2016-04-25 NOTE — Progress Notes (Signed)
Patient ID: Beverly Cole, female   DOB: 14-Feb-1960, 56 y.o.   MRN: [id number]   Subjective:  I acted as a Education administrator for [employer], DO. Beverly Cole, [place]   Patient ID: Beverly Cole, female    DOB: 05-08-1960, 56 y.o.   MRN: [id number]  Chief Complaint  Patient presents with  . Shortness of Breath    Shortness of Breath  Associated symptoms include chest pain. Pertinent negatives include no fever, headaches, leg swelling, rash or vomiting.    Patient is in today for acute visit for dyspnea. Patient states she has had some chest pain, palpitations, shortness of breath. Patient has a Hx of pulmonary embolism. Patient has no additional acute concerns noted at this time.  Pt has had inc cp and sob over the last few weeks   Patient Care Team: Ann Held, DO as PCP - General (Family Medicine) Kem Boroughs, FNP as Nurse Practitioner (Nurse Practitioner) Kathrynn Ducking, MD as Consulting Physician (Neurology) Melida Quitter, MD as Consulting Physician Regina Eck, CNM as Referring Physician (Certified Nurse Midwife) Juanita Craver, MD as Consulting Physician (Gastroenterology) Volanda Napoleon, MD as Consulting Physician (Oncology) Purnell Shoemaker., MD as Attending Physician (Psychiatry) Barnett Abu, LCSW (Inactive) as Social Worker (Licensed Clinical Social Worker) Burnell Blanks, MD as Consulting Physician (Cardiology) Sharol Roussel, [place] as Physician Assistant (Optometry) Suszanne Finch, DC (Chiropractic Medicine) Kem Boroughs, FNP as Nurse Practitioner (Family Medicine)   Past Medical History:  Diagnosis Date  . Anxiety   . Atrial fibrillation ([place])    a. Event monitor 2013 - PACs/bradycardia/SVT/short run of atrial fib by event monitor.   Marland Kitchen BIPOLAR DISORDER UNSPECIFIED   . Bradycardia   . Bursitis of hip 09/1999   Bilateral - Dr. Berenice Primas  . DEPRESSION   . Fistula of female genitalia 03/29/2007   enterovesical fistula, not reapired  . GERD   .  Headache(784.0)    was with menstrual cycle. no longer a problem  . HYPERLIPIDEMIA   . Hypertension   . Hypertension   . IRRITABLE BOWEL SYNDROME, HX OF   . Menopausal state 01/2012   FSH = 88.5  . Mitral valve prolapse 12/17/2000   a. dx 1980s, most recent echo did not demonstrate this.  Marland Kitchen MYALGIA   . Normal coronary arteries    a. by cardiac CT 2013.  Marland Kitchen PAT (paroxysmal atrial tachycardia) ([place])   . PE (pulmonary embolism) 02/09/2015   a. Bilateral PEs 01/2015 when d-dimer 0.69, CP lying on left side. Followed by heme-onc - + lupus anticoagulant, mildly depressed protein S. Dr. Marin Olp is not certain if her hypercoagulable studies are significant for a thrombophilic state.  . Premature atrial contractions   . Shortness of breath    Pulmonary eval 05/2002  . Thyroid nodule 2014  . Tremors of nervous system     Past Surgical History:  Procedure Laterality Date  . COLONOSCOPY    . Lipoma (R) side  1990   Right back    Family History  Problem Relation Age of Onset  . Lung cancer Mother   . Hypertension Mother   . Hyperlipidemia Mother   . Cancer Father     Esophageal cancer  . Hyperlipidemia Father   . Depression Father   . Cancer Brother   . Colon cancer Paternal Uncle   . Sarcoidosis Other   . Testicular cancer Other     Social History   Social History  . Marital  status: Married    Spouse name: N/A  . Number of children: 0  . Years of education: N/A   Occupational History  . 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 Smoking status: Former Smoker    Packs/day: 1.00    Years: 25.00    Types: Cigarettes    Quit date: 02/11/1996  . Smokeless tobacco: Never Used     Comment: Married, lives with spouse. 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 Alcohol use 1.8 oz/week    3 Standard drinks or equivalent per week  . Drug use: No  . Sexual activity: Yes    Partners: Male    Birth control/ protection: Post-menopausal   Other Topics  Concern  . Not on file   Social History Narrative   Married, lives with spouse in [place]. 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 ALPRAZolam (XANAX) 0.5 MG tablet Take 0.25-0.5 mg by mouth at bedtime as needed for sleep.    Marland Kitchen aspirin EC 81 MG tablet Take 81 mg by mouth daily.    Marland Kitchen BIOTIN PO Take 1 tablet by mouth daily.    . Cholecalciferol (VITAMIN D3) 1000 UNITS CAPS Take 1,000 Units by mouth daily.     Marland Kitchen dexlansoprazole (DEXILANT) 60 MG capsule Take 60 mg by mouth daily.    Marland Kitchen diltiazem (CARTIA XT) 180 MG 24 hr capsule TAKE 1 CAPSULE (180 MG TOTAL) BY MOUTH DAILY. 90 capsule 1  . FINACEA 15 % FOAM APPLY TOPICALLY DAILY AS NEEDED 50 g 3  . fluticasone (FLONASE) 50 MCG/ACT nasal spray Place 2 sprays into the nose daily as needed. For allergies    . lidocaine (XYLOCAINE) 2 % jelly Apply topically as needed (for dysparunia). 30 mL 12  . nitrofurantoin, macrocrystal-monohydrate, (MACROBID) 100 MG capsule Take 1 capsule (100 mg total) by mouth once as needed (post coital). 30 capsule 8  . pramipexole (MIRAPEX) 0.25 MG tablet Take 1 mg by mouth at bedtime.   0  . Probiotic Product (PROBIOTIC DAILY PO) Take 1 tablet by mouth daily.     . propranolol (INDERAL) 10 MG tablet Take 20 mg by mouth daily as needed (shakiness). Reported on 05/17/2015  1  . QUEtiapine (SEROQUEL XR) 400 MG 24 hr tablet Take 200 mg by mouth at bedtime.   0  . tretinoin (RETIN-A) 0.05 % cream Apply topically at bedtime. 45 g 3  . rivaroxaban (XARELTO) 20 MG TABS tablet Take 1 tablet (20 mg total) by mouth daily with supper. (Patient not taking: Reported on 04/25/2016) 90 tablet 1   No facility-administered medications prior to visit.     Allergies  Allergen Reactions  . Cariprazine Other (See Comments)    Patient becomes suicidal when taking this medication.   . Lopressor [Metoprolol]  Other (See Comments)    Hallucinations hair falls out  . Nickel Rash    Review of Systems  Constitutional: Negative for fever and malaise/fatigue.  HENT: Negative for congestion.   Eyes: Negative for blurred vision.  Respiratory: Positive for shortness of breath. Negative for cough.   Cardiovascular: Positive for chest pain. Negative for palpitations and leg swelling.  Gastrointestinal: Negative for vomiting.  Musculoskeletal: Negative for back pain.  Skin: Negative for rash.  Neurological: Negative for loss of consciousness and  headaches.       Objective:    Physical Exam  Constitutional: She is oriented to person, place, and time. She appears well-developed and well-nourished.  HENT:  Head: Normocephalic and atraumatic.  Eyes: Conjunctivae and EOM are normal.  Neck: Normal range of motion. Neck supple. No JVD present. Carotid bruit is not present. No thyromegaly present.  Cardiovascular: Normal rate, regular rhythm and normal heart sounds.   No murmur heard. Pulmonary/Chest: Effort normal and breath sounds normal. No respiratory distress. She has no wheezes. She has no rales. She exhibits no tenderness.  Musculoskeletal: She exhibits no edema.  Neurological: She is alert and oriented to person, place, and time.  Psychiatric: She has a normal mood and affect. Her behavior is normal. Judgment and thought content normal.  Nursing note and vitals reviewed.   BP 126/72 (BP Location: Left Arm, Patient Position: Sitting, Cuff Size: Normal)   Pulse 88   Temp 98.2 F (36.8 C) (Oral)   Wt 167 lb (75.8 kg)   LMP 05/11/2012   SpO2 99% Comment: RA  BMI 24.66 kg/m  Wt Readings from Last 3 Encounters:  04/25/16 167 lb (75.8 kg)  01/16/16 171 lb 15.7 oz (78 kg)  12/19/15 168 lb (76.2 kg)      Immunization History  Administered Date(s) Administered  . Influenza Split 03/04/2011  . Influenza Whole 02/06/2012, 11/01/2012  . Influenza,inj,Quad PF,36+ Mos 10/31/2014, 11/13/2015    . Influenza-Unspecified 10/27/2013  . PPD Test 03/23/2012, 04/05/2013, 05/16/2014  . Td 05/31/2002  . Tdap 05/13/2011    Health Maintenance  Topic Date Due  . MAMMOGRAM  10/18/2017  . PAP SMEAR  06/05/2018  . TETANUS/TDAP  05/12/2021  . COLONOSCOPY  11/27/2025  . INFLUENZA VACCINE  Completed  . Hepatitis C Screening  Completed  . HIV Screening  Completed    Lab Results  Component Value Date   WBC 5.8 04/25/2016   HGB 13.2 04/25/2016   HCT 40.5 04/25/2016   PLT 195 04/25/2016   GLUCOSE 80 04/25/2016   CHOL 236 (H) 11/13/2015   TRIG 171.0 (H) 11/13/2015   HDL 49.70 11/13/2015   LDLDIRECT 212.0 10/31/2014   LDLCALC 152 (H) 11/13/2015   ALT 11 04/25/2016   AST 15 04/25/2016   NA 142 04/25/2016   K 3.8 04/25/2016   CL 107 04/25/2016   CREATININE 0.75 04/25/2016   BUN 14 04/25/2016   CO2 22 04/25/2016   TSH 2.15 11/13/2015   INR 1.10 02/09/2015   HGBA1C 5.3 03/20/2009   MICROALBUR 0.6 05/19/2011    Lab Results  Component Value Date   TSH 2.15 11/13/2015   Lab Results  Component Value Date   WBC 5.8 04/25/2016   HGB 13.2 04/25/2016   HCT 40.5 04/25/2016   MCV 86.0 04/25/2016   PLT 195 04/25/2016   Lab Results  Component Value Date   NA 142 04/25/2016   K 3.8 04/25/2016   CHLORIDE 110 (H) 01/16/2016   CO2 22 04/25/2016   GLUCOSE 80 04/25/2016   BUN 14 04/25/2016   CREATININE 0.75 04/25/2016   BILITOT 0.3 04/25/2016   ALKPHOS 65 04/25/2016   AST 15 04/25/2016   ALT 11 04/25/2016   PROT 7.1 04/25/2016   ALBUMIN 4.1 04/25/2016   CALCIUM 9.1 04/25/2016   ANIONGAP 12 (H) 01/16/2016   EGFR 75 (L) 01/16/2016   GFR 65.66 02/09/2015   Lab Results  Component Value Date   CHOL 236 (H) 11/13/2015   Lab Results  Component Value Date  HDL 49.70 11/13/2015   Lab Results  Component Value Date   LDLCALC 152 (H) 11/13/2015   Lab Results  Component Value Date   TRIG 171.0 (H) 11/13/2015   Lab Results  Component Value Date   CHOLHDL 5 11/13/2015    Lab Results  Component Value Date   HGBA1C 5.3 03/20/2009  ekg--nsr- no changes from previous      Assessment & Plan:   Problem List Items Addressed This Visit      Unprioritized   Shortness of breath - Primary    Pt with 1 week hx of worsening sob and cp and hx of PE xaralto dec to 10 mg daily by oncology       Relevant Orders   EKG 12-Lead (Completed)   CT ANGIO CHEST PE W OR WO CONTRAST (Completed)   CBC (Completed)   Comp Met (CMET) (Completed)   D-Dimer, Quantitative (Completed)   Chest pain    Check CT r/o PE---pt taking 10 mg xaralto daily       Relevant Orders   EKG 12-Lead (Completed)   CT ANGIO CHEST PE W OR WO CONTRAST (Completed)   CBC (Completed)   Comp Met (CMET) (Completed)   D-Dimer, Quantitative (Completed)   Pulmonary embolus ([facility])    con't xaralto Ct chest now         Relevant Medications   rivaroxaban (XARELTO) 10 MG TABS tablet    Other Visit Diagnoses    Palpitations       Relevant Orders   CBC (Completed)   Comp Met (CMET) (Completed)   D-Dimer, Quantitative (Completed)   History of pulmonary embolism       Relevant Orders   EKG 12-Lead (Completed)   CT ANGIO CHEST PE W OR WO CONTRAST (Completed)   CBC (Completed)   Comp Met (CMET) (Completed)   D-Dimer, Quantitative (Completed)      I am having Ms. Riding maintain her fluticasone, Vitamin D3, Probiotic Product (PROBIOTIC DAILY PO), propranolol, ALPRAZolam, nitrofurantoin (macrocrystal-monohydrate), lidocaine, pramipexole, BIOTIN PO, diltiazem, aspirin EC, tretinoin, FINACEA, QUEtiapine, dexlansoprazole, Divalproex Sodium (DEPAKOTE PO), and rivaroxaban.  Meds ordered this encounter  Medications  . Divalproex Sodium (DEPAKOTE PO)    Sig: Take 1,000 mg by mouth at bedtime.  . rivaroxaban (XARELTO) 10 MG TABS tablet    Sig: Take 10 mg by mouth daily.    CMA served as Education administrator during this visit. History, Physical and Plan performed by medical provider. Documentation and orders  reviewed and attested to.  Ann Held, DO

## 2016-04-25 NOTE — Assessment & Plan Note (Signed)
Pt with 1 week hx of worsening sob and cp and hx of PE xaralto dec to 10 mg daily by oncology

## 2016-04-25 NOTE — Telephone Encounter (Signed)
Patient called stating that for the past 2 hours she has been SOB and seems to be having trouble breathing. Transferred to [facility]

## 2016-04-25 NOTE — Assessment & Plan Note (Addendum)
con't xaralto Ct chest now

## 2016-04-25 NOTE — Progress Notes (Signed)
Pre visit review using our clinic review tool, if applicable. No additional management support is needed unless otherwise documented below in the visit note. 

## 2016-04-25 NOTE — Patient Instructions (Signed)
Nonspecific Chest Pain °Chest pain can be caused by many different conditions. There is always a chance that your pain could be related to something serious, such as a heart attack or a blood clot in your lungs. Chest pain can also be caused by conditions that are not life-threatening. If you have chest pain, it is very important to follow up with your health care provider. °What are the causes? °Causes of this condition include: °· Heartburn. °· Pneumonia or bronchitis. °· Anxiety or stress. °· Inflammation around your heart (pericarditis) or lung (pleuritis or pleurisy). °· A blood clot in your lung. °· A collapsed lung (pneumothorax). This can develop suddenly on its own (spontaneous pneumothorax) or from trauma to the chest. °· Shingles infection (varicella-zoster virus). °· Heart attack. °· Damage to the bones, muscles, and cartilage that make up your chest wall. This can include: °? Bruised bones due to injury. °? Strained muscles or cartilage due to frequent or repeated coughing or overwork. °? Fracture to one or more ribs. °? Sore cartilage due to inflammation (costochondritis). ° °What increases the risk? °Risk factors for this condition may include: °· Activities that increase your risk for trauma or injury to your chest. °· Respiratory infections or conditions that cause frequent coughing. °· Medical conditions or overeating that can cause heartburn. °· Heart disease or family history of heart disease. °· Conditions or health behaviors that increase your risk of developing a blood clot. °· Having had chicken pox (varicella zoster). ° °What are the signs or symptoms? °Chest pain can feel like: °· Burning or tingling on the surface of your chest or deep in your chest. °· Crushing, pressure, aching, or squeezing pain. °· Dull or sharp pain that is worse when you move, cough, or take a deep breath. °· Pain that is also felt in your back, neck, shoulder, or arm, or pain that spreads to any of these  areas. ° °Your chest pain may come and go, or it may stay constant. °How is this diagnosed? °Lab tests or other studies may be needed to find the cause of your pain. Your health care provider may have you take a test called an ECG (electrocardiogram). An ECG records your heartbeat patterns at the time the test is performed. You may also have other tests, such as: °· Transthoracic echocardiogram (TTE). In this test, sound waves are used to create a picture of the heart structures and to look at how blood flows through your heart. °· Transesophageal echocardiogram (TEE). This is a more advanced imaging test that takes images from inside your body. It allows your health care provider to see your heart in finer detail. °· Cardiac monitoring. This allows your health care provider to monitor your heart rate and rhythm in real time. °· Holter monitor. This is a portable device that records your heartbeat and can help to diagnose abnormal heartbeats. It allows your health care provider to track your heart activity for several days, if needed. °· Stress tests. These can be done through exercise or by taking medicine that makes your heart beat more quickly. °· Blood tests. °· Other imaging tests. ° °How is this treated? °Treatment depends on what is causing your chest pain. Treatment may include: °· Medicines. These may include: °? Acid blockers for heartburn. °? Anti-inflammatory medicine. °? Pain medicine for inflammatory conditions. °? Antibiotic medicine, if an infection is present. °? Medicines to dissolve blood clots. °? Medicines to treat coronary artery disease (CAD). °· Supportive care for conditions that   do not require medicines. This may include: °? Resting. °? Applying heat or cold packs to injured areas. °? Limiting activities until pain decreases. ° °Follow these instructions at home: °Medicines °· If you were prescribed an antibiotic, take it as told by your health care provider. Do not stop taking the  antibiotic even if you start to feel better. °· Take over-the-counter and prescription medicines only as told by your health care provider. °Lifestyle °· Do not use any products that contain nicotine or tobacco, such as cigarettes and e-cigarettes. If you need help quitting, ask your health care provider. °· Do not drink alcohol. °· Make lifestyle changes as directed by your health care provider. These may include: °? Getting regular exercise. Ask your health care provider to suggest some activities that are safe for you. °? Eating a heart-healthy diet. A registered dietitian can help you to learn healthy eating options. °? Maintaining a healthy weight. °? Managing diabetes, if necessary. °? Reducing stress, such as with yoga or relaxation techniques. °General instructions °· Avoid any activities that bring on chest pain. °· If heartburn is the cause for your chest pain, raise (elevate) the head of your bed about 6 inches (15 cm) by putting blocks under the legs. Sleeping with more pillows does not effectively relieve heartburn because it only changes the position of your head. °· Keep all follow-up visits as told by your health care provider. This is important. This includes any further testing if your chest pain does not go away. °Contact a health care provider if: °· Your chest pain does not go away. °· You have a rash with blisters on your chest. °· You have a fever. °· You have chills. °Get help right away if: °· Your chest pain is worse. °· You have a cough that gets worse, or you cough up blood. °· You have severe pain in your abdomen. °· You have severe weakness. °· You faint. °· You have sudden, unexplained chest discomfort. °· You have sudden, unexplained discomfort in your arms, back, neck, or jaw. °· You have shortness of breath at any time. °· You suddenly start to sweat, or your skin gets clammy. °· You feel nauseous or you vomit. °· You suddenly feel light-headed or dizzy. °· Your heart begins to beat  quickly, or it feels like it is skipping beats. °These symptoms may represent a serious problem that is an emergency. Do not wait to see if the symptoms will go away. Get medical help right away. Call your local emergency services (911 in the [place] Do not drive yourself to the hospital. °This information is not intended to replace advice given to you by your health care provider. Make sure you discuss any questions you have with your health care provider. °Document Released: 11/06/2004 Document Revised: 10/22/2015 Document Reviewed: 10/22/2015 °Elsevier Interactive Patient Education © 2017 [employer] ° °

## 2016-04-25 NOTE — Telephone Encounter (Signed)
Patient Name: Beverly Cole DOB: May 13, 1960 Initial Comment Caller states having shortness of breath, burning when she breathes and she has hx of PE Nurse Assessment Nurse: Lavera Guise, RN, Vaughan Basta Date/Time (Eastern Time): 04/25/2016 12:57:22 PM Confirm and document reason for call. If symptomatic, describe symptoms. ---Caller states having shortness of breath, burning when she breathes and she has hx of PE; has gotten worse in last 2 hours. Does the patient have any new or worsening symptoms? ---Yes Will a triage be completed? ---Yes Related visit to physician within the last 2 weeks? ---No Does the PT have any chronic conditions? (i.e. diabetes, asthma, etc.) ---Yes List chronic conditions. ---hx of PE bipolar GERD hypertension anxiety Is this a behavioral health or substance abuse call? ---No Guidelines Guideline Title Affirmed Question Affirmed Notes Breathing Difficulty [1] MODERATE difficulty breathing (e.g., speaks in phrases, SOB even at rest, pulse 100-120) AND [2] NEW-onset or WORSE than normal Final Disposition User Go to ED Now Lavera Guise, RN, [facility] - ED Disagree/Comply: Comply

## 2016-04-25 NOTE — Telephone Encounter (Signed)
Patient seen in the office today by PCP.

## 2016-04-26 LAB — D-DIMER, QUANTITATIVE (NOT AT ARMC): D-Dimer, Quant: 0.3 mcg/mL FEU (ref <0.50)

## 2016-04-27 ENCOUNTER — Encounter: Payer: Self-pay | Admitting: Family Medicine

## 2016-04-27 NOTE — Assessment & Plan Note (Signed)
Check CT r/o PE---pt taking 10 mg xaralto daily

## 2016-04-28 ENCOUNTER — Other Ambulatory Visit: Payer: Self-pay | Admitting: Family Medicine

## 2016-04-28 ENCOUNTER — Encounter: Payer: Self-pay | Admitting: Family Medicine

## 2016-04-28 DIAGNOSIS — F3181 Bipolar II disorder: Secondary | ICD-10-CM | POA: Diagnosis not present

## 2016-04-28 MED ORDER — RIVAROXABAN 10 MG PO TABS: 10.0000 mg | ORAL_TABLET | Freq: Every day | ORAL | 2 refills | Status: DC

## 2016-04-28 MED FILL — XARELTO 10 MG TABLET: 10 | 90 days supply | Qty: 90 | Fill #0

## 2016-04-28 NOTE — Telephone Encounter (Signed)
Spoke to the patient and refilled her medication.

## 2016-04-28 NOTE — Telephone Encounter (Signed)
Relation to [id number] Call back number:[phone] Pharmacy: [place], [place] - [address]. [phone] (Phone) [phone] (Fax)     Reason for call:  Patient requesting a refill rivaroxaban (XARELTO) 10 MG TABS tablet

## 2016-04-30 ENCOUNTER — Encounter: Payer: Self-pay | Admitting: Pulmonary Disease

## 2016-04-30 DIAGNOSIS — E663 Overweight: Secondary | ICD-10-CM | POA: Diagnosis not present

## 2016-04-30 DIAGNOSIS — M2550 Pain in unspecified joint: Secondary | ICD-10-CM | POA: Diagnosis not present

## 2016-04-30 DIAGNOSIS — R760 Raised antibody titer: Secondary | ICD-10-CM | POA: Diagnosis not present

## 2016-04-30 DIAGNOSIS — Z6825 Body mass index (BMI) 25.0-25.9, adult: Secondary | ICD-10-CM | POA: Diagnosis not present

## 2016-04-30 DIAGNOSIS — D8989 Other specified disorders involving the immune mechanism, not elsewhere classified: Secondary | ICD-10-CM | POA: Diagnosis not present

## 2016-04-30 NOTE — Telephone Encounter (Signed)
Please call pulm for me---- maybe ask karen if Dr Elsworth Soho can see her when he is here next (upstairs) ---  If they can not see her soon Have jen / kristie see if she can be seen somewhere else -- maybe [facility]

## 2016-04-30 NOTE — Telephone Encounter (Signed)
Spoke with Beverly Cole who stated that Dr. Elsworth Soho has an appt available tomorrow at 10:45 am.  Called patient to verify that she would be able to make appt.  Pt did not answer.  Left a message for call back.

## 2016-04-30 NOTE — Telephone Encounter (Signed)
Noted.  Left a message for Beverly Cole to call back regarding message below.

## 2016-04-30 NOTE — Telephone Encounter (Signed)
Caller name: Relationship to patient: Self Can be reached: [phone]  Pharmacy:  Reason for call: Patient called to ask provider if she can call a Pulmonologist to get her in within the next few days. States Dr. Amil Amen suggested she see Pulmonology.

## 2016-04-30 NOTE — Telephone Encounter (Signed)
Spoke to Santiago Glad who stated she would look into getting patient seen sooner.  She will call back with update.

## 2016-04-30 NOTE — Telephone Encounter (Signed)
Called patient again and left a message for call back.

## 2016-05-01 ENCOUNTER — Encounter: Payer: Self-pay | Admitting: Pulmonary Disease

## 2016-05-01 ENCOUNTER — Telehealth: Payer: Self-pay | Admitting: Pulmonary Disease

## 2016-05-01 ENCOUNTER — Encounter: Payer: Self-pay | Admitting: Family Medicine

## 2016-05-01 ENCOUNTER — Institutional Professional Consult (permissible substitution): Payer: Self-pay | Admitting: Pulmonary Disease

## 2016-05-01 ENCOUNTER — Ambulatory Visit (INDEPENDENT_AMBULATORY_CARE_PROVIDER_SITE_OTHER): Payer: 59 | Admitting: Pulmonary Disease

## 2016-05-01 VITALS — BP 110/80 | HR 96 | Ht 69.0 in | Wt 171.4 lb

## 2016-05-01 DIAGNOSIS — R0600 Dyspnea, unspecified: Secondary | ICD-10-CM | POA: Diagnosis not present

## 2016-05-01 DIAGNOSIS — I2782 Chronic pulmonary embolism: Secondary | ICD-10-CM | POA: Diagnosis not present

## 2016-05-01 DIAGNOSIS — R0602 Shortness of breath: Secondary | ICD-10-CM

## 2016-05-01 NOTE — Assessment & Plan Note (Signed)
Repeated CT angiogram has not shown pulmonary emboli. She is maintained on low-dose Xarelto for her positive lupus anticoagulant

## 2016-05-01 NOTE — Progress Notes (Signed)
Subjective:    Patient ID: Beverly Cole, female    DOB: 1960-04-21, 56 y.o.   MRN: [id number]  HPI  Chief Complaint  Patient presents with  . Pulmonary Consult    referred by Dr. Etter Sjogren. Pt states she has fluid in her lungs per her CT scan. Pt is having SOB that has been ongoing for a few weeks. Chest pain when she takes a deep breath.     56 year old remote smoker presents for evaluation of abnormal imaging studies. She reports dyspnea on exertion for about 2-3 weeks. She also has a lot of chronic symptoms such as palpitations and feeling dizzy. She has a history of pulmonary embolism 01/2015 and was found to have positive lupus anticoagulant, has been maintained on Xarelto since then (ennever) . She had 3 CT scans since then for dyspnea, all of which have been negative for pulmonary embolism, last CT scan was done in 04/15/16 which also commented on small layering bilateral effusions. I have reviewed these films and I'm not impressed with the presence of effusions.  She denies cough, wheezing or childhood history of asthma. Is no history of pedal edema, orthopnea or paroxysmal nocturnal dyspnea. She does have history of mitral valve prolapse.  Echo 01/2016 shows mild mitral regurgitation with normal RVSP and grade 1 diastolic dysfunction with normal LV function  She does have chronic anxiety and bipolar disorder but she feels that her symptoms of dyspnea are not related to her symptoms of anxiety. She is a remote smoker and had her most smoked about 1.5 pack per day, but 20 pack years before she quit in her 43s. She is a disabled and retired Therapist, sports  She did not desaturate on walking around the office today for 3 laps. Spirometry showed a ratio of 82 with FEV1 of 84% and FVC of 80%     Significant tests/ events  Echo 01/2016 nml LV function, mild MR, grade 1 DD CT angio 04/2016 >> small BL effusions, neg PE   Past Medical History:  Diagnosis Date  . Anxiety   . Atrial fibrillation  ([place])    a. Event monitor 2013 - PACs/bradycardia/SVT/short run of atrial fib by event monitor.   Marland Kitchen BIPOLAR DISORDER UNSPECIFIED   . Bradycardia   . Bursitis of hip 09/1999   Bilateral - Dr. Berenice Primas  . DEPRESSION   . Fistula of female genitalia 03/29/2007   enterovesical fistula, not reapired  . GERD   . Headache(784.0)    was with menstrual cycle. no longer a problem  . HYPERLIPIDEMIA   . Hypertension   . Hypertension   . IRRITABLE BOWEL SYNDROME, HX OF   . Menopausal state 01/2012   FSH = 88.5  . Mitral valve prolapse 12/17/2000   a. dx 1980s, most recent echo did not demonstrate this.  Marland Kitchen MYALGIA   . Normal coronary arteries    a. by cardiac CT 2013.  Marland Kitchen PAT (paroxysmal atrial tachycardia) ([place])   . PE (pulmonary embolism) 02/09/2015   a. Bilateral PEs 01/2015 when d-dimer 0.69, CP lying on left side. Followed by heme-onc - + lupus anticoagulant, mildly depressed protein S. Dr. Marin Olp is not certain if her hypercoagulable studies are significant for a thrombophilic state.  . Premature atrial contractions   . Shortness of breath    Pulmonary eval 05/2002  . Thyroid nodule 2014  . Tremors of nervous system    Past Surgical History:  Procedure Laterality Date  . COLONOSCOPY    . Lipoma (  R) side  1990   Right back    Allergies  Allergen Reactions  . Cariprazine Other (See Comments)    Patient becomes suicidal when taking this medication.   . Lopressor [Metoprolol] Other (See Comments)    Hallucinations hair falls out  . Nickel Rash     Social History   Social History  . Marital status: Married    Spouse name: N/A  . Number of children: 0  . Years of education: N/A   Occupational History  . 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 Smoking status: Former Smoker    Packs/day: 1.00    Years: 25.00    Types: Cigarettes    Quit date: 02/11/1996  . Smokeless tobacco: Never Used     Comment: Married, lives with spouse. 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 Alcohol use 1.8 oz/week    3 Standard drinks or equivalent per week  . Drug use: No  . Sexual activity: Yes    Partners: Male    Birth control/ protection: Post-menopausal   Other Topics Concern  . Not on file   Social History Narrative   Married, lives with spouse in [place]. 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 Lung cancer Mother   . Hypertension Mother   . Hyperlipidemia Mother   . Cancer Father     Esophageal cancer  . Hyperlipidemia Father   . Depression Father   . Cancer Brother   . Colon cancer Paternal Uncle   . Sarcoidosis Other   . Testicular cancer Other      Review of Systems    Constitutional: negative for anorexia, fevers and sweats  Eyes: negative for irritation, redness and visual disturbance  Ears, nose, mouth, throat, and face: negative for earaches, epistaxis, nasal congestion and sore throat  Respiratory: negative for cough,  sputum and wheezing  Cardiovascular: negative for chest pain, dyspnea, lower extremity edema, orthopnea, palpitations and syncope  Gastrointestinal: negative for abdominal pain, constipation, diarrhea, melena, nausea and vomiting  Genitourinary:negative for dysuria, frequency and hematuria  Hematologic/lymphatic: negative for bleeding, easy bruising and lymphadenopathy  Musculoskeletal:negative for arthralgias, muscle weakness and stiff joints  Neurological: negative for coordination problems, gait problems, headaches and weakness  Endocrine: negative for diabetic symptoms including polydipsia, polyuria and weight loss     Objective:   Physical Exam  Gen. Pleasant, well-nourished, in no distress, normal affect ENT - no lesions, no post nasal drip Neck: No JVD, no thyromegaly, no carotid bruits Lungs: no use of accessory muscles, no dullness to percussion, clear without rales or  rhonchi  Cardiovascular: Rhythm regular, heart sounds  normal, no murmurs or gallops, no peripheral edema Abdomen: soft and non-tender, no hepatosplenomegaly, BS normal. Musculoskeletal: No deformities, no cyanosis or clubbing Neuro:  alert, non focal       Assessment & Plan:

## 2016-05-01 NOTE — Assessment & Plan Note (Signed)
No clear etiology identified. She does not seem to have significant obstructive lung disease, there is no evidence of pulmonary fibrosis or other interstitial lung disease. She does not report hypertension on her echo. I am really not impressed with pleural effusions, she may have grade 1 diastolic dysfunction but does not have any other findings to suggest overt congestive heart failure. I do not feel that she needs diuretics at this time.  I wonder if her symptoms of dyspnea at rest may be related to anxiety

## 2016-05-01 NOTE — Telephone Encounter (Signed)
Verified appt in Epic w/ RA this afternoon 3.22.18 @ 1415 Will sign off

## 2016-05-01 NOTE — Progress Notes (Signed)
   Subjective:    Patient ID: Beverly Cole, female    DOB: May 26, 1960, 56 y.o.   MRN: [id number]  HPI    Review of Systems  Constitutional: Positive for unexpected weight change. Negative for fever.  HENT: Positive for trouble swallowing. Negative for congestion, dental problem, ear pain, nosebleeds, postnasal drip, rhinorrhea, sinus pressure, sneezing and sore throat.   Eyes: Negative.  Negative for redness and itching.  Respiratory: Positive for cough and shortness of breath. Negative for chest tightness and wheezing.   Cardiovascular: Positive for palpitations. Negative for leg swelling.  Gastrointestinal: Negative.  Negative for nausea and vomiting.  Genitourinary: Negative.  Negative for dysuria.  Musculoskeletal: Positive for joint swelling.  Skin: Negative.  Negative for rash.  Neurological: Negative.  Negative for headaches.  Hematological: Bruises/bleeds easily.  Psychiatric/Behavioral: Negative.  Negative for dysphoric mood. The patient is not nervous/anxious.        Objective:   Physical Exam        Assessment & Plan:

## 2016-05-01 NOTE — Patient Instructions (Signed)
Lung function appears good Oxygen level did not fall with walking Not impressed with fluid around your lungs-no need for diuretics at this time   Call if worse

## 2016-05-07 ENCOUNTER — Ambulatory Visit (INDEPENDENT_AMBULATORY_CARE_PROVIDER_SITE_OTHER): Payer: 59 | Admitting: Psychology

## 2016-05-07 DIAGNOSIS — F332 Major depressive disorder, recurrent severe without psychotic features: Secondary | ICD-10-CM

## 2016-05-23 ENCOUNTER — Other Ambulatory Visit: Payer: Self-pay | Admitting: Family Medicine

## 2016-05-23 DIAGNOSIS — I10 Essential (primary) hypertension: Secondary | ICD-10-CM

## 2016-05-23 MED FILL — DEXILANT DR 60 MG CAPSULE: 60 | 30 days supply | Qty: 30 | Fill #2

## 2016-05-23 MED FILL — CARTIA XT 180 MG CAPSULE SA: 180 | 90 days supply | Qty: 90 | Fill #0

## 2016-05-30 ENCOUNTER — Institutional Professional Consult (permissible substitution): Payer: Self-pay | Admitting: Internal Medicine

## 2016-06-10 ENCOUNTER — Ambulatory Visit: Payer: 59 | Admitting: Nurse Practitioner

## 2016-06-13 ENCOUNTER — Ambulatory Visit: Payer: 59 | Admitting: Nurse Practitioner

## 2016-06-17 ENCOUNTER — Encounter: Payer: Self-pay | Admitting: Obstetrics and Gynecology

## 2016-06-17 ENCOUNTER — Ambulatory Visit: Payer: 59 | Admitting: Nurse Practitioner

## 2016-06-17 ENCOUNTER — Ambulatory Visit (INDEPENDENT_AMBULATORY_CARE_PROVIDER_SITE_OTHER): Payer: 59 | Admitting: Obstetrics and Gynecology

## 2016-06-17 VITALS — BP 110/60 | HR 80 | Resp 16 | Ht 69.0 in | Wt 171.0 lb

## 2016-06-17 DIAGNOSIS — N941 Unspecified dyspareunia: Secondary | ICD-10-CM | POA: Diagnosis not present

## 2016-06-17 DIAGNOSIS — Z01419 Encounter for gynecological examination (general) (routine) without abnormal findings: Secondary | ICD-10-CM

## 2016-06-17 DIAGNOSIS — N952 Postmenopausal atrophic vaginitis: Secondary | ICD-10-CM

## 2016-06-17 DIAGNOSIS — Z124 Encounter for screening for malignant neoplasm of cervix: Secondary | ICD-10-CM | POA: Diagnosis not present

## 2016-06-17 NOTE — Progress Notes (Signed)
56 y.o. G0P0 MarriedCaucasianF here for annual exam.  Postmenopausal, no bleeding. Not sexually active secondary to pain.  She had a PE on HRT, she has anticardiolipid antibodies and a mild protein S def. She is on Xaralto (weaning off) and ASA daily.  Her brother and aunts have both had clots (PE and DVT).     Patient's last menstrual period was 05/11/2012.          Sexually active: Yes.    The current method of family planning is post menopausal status.    Exercising: No.  The patient does not participate in regular exercise at present. Smoker:  Former   Health Maintenance: Pap:  06-05-15 WNL NEG HR HPV 06-02-14 WNL NEG HR HPV History of abnormal Pap:  Yes years ago cryosurgery  MMG:  10-19-15 WNL  Colonoscopy:  11-28-15 WNL  BMD:   Never TDaP:  05-13-11  Gardasil: NA   reports that she quit smoking about 20 years ago. Her smoking use included Cigarettes. She has a 25.00 pack-year smoking history. She has never used smokeless tobacco. She reports that she drinks about 0.6 oz of alcohol per week . She reports that she does not use drugs. Married x 30 years, great husband. She is on disability, worked as a Therapist, sports in the past.   Past Medical History:  Diagnosis Date  . Anxiety   . Atrial fibrillation ([place])    a. Event monitor 2013 - PACs/bradycardia/SVT/short run of atrial fib by event monitor.   Marland Kitchen BIPOLAR DISORDER UNSPECIFIED   . Bradycardia   . Bursitis of hip 09/1999   Bilateral - Dr. Berenice Primas  . DEPRESSION   . GERD   . Headache(784.0)    was with menstrual cycle. no longer a problem  . HYPERLIPIDEMIA   . Hypertension   . Hypertension   . IRRITABLE BOWEL SYNDROME, HX OF   . Menopausal state 01/2012   FSH = 88.5  . Mitral valve prolapse 12/17/2000   a. dx 1980s, most recent echo did not demonstrate this.  Marland Kitchen MYALGIA   . Normal coronary arteries    a. by cardiac CT 2013.  Marland Kitchen PAT (paroxysmal atrial tachycardia) ([place])   . PE (pulmonary embolism) 02/09/2015   a. Bilateral PEs 01/2015  when d-dimer 0.69, CP lying on left side. Followed by heme-onc - + lupus anticoagulant, mildly depressed protein S. Dr. Marin Olp is not certain if her hypercoagulable studies are significant for a thrombophilic state.  . Premature atrial contractions   . Shortness of breath    Pulmonary eval 05/2002  . Thyroid nodule 2014  . Tremors of nervous system     Past Surgical History:  Procedure Laterality Date  . COLONOSCOPY    . Lipoma (R) side  1990   Right back    Current Outpatient Prescriptions  Medication Sig Dispense Refill  . ALPRAZolam (XANAX) 0.5 MG tablet Take 0.25-0.5 mg by mouth at bedtime as needed for sleep.    Marland Kitchen aspirin EC 81 MG tablet Take 81 mg by mouth daily.    Marland Kitchen BIOTIN PO Take 1 tablet by mouth daily.    . Cholecalciferol (VITAMIN D3) 1000 UNITS CAPS Take 1,000 Units by mouth daily.     Marland Kitchen dexlansoprazole (DEXILANT) 60 MG capsule Take 60 mg by mouth daily.    Marland Kitchen diltiazem (CARDIZEM CD) 180 MG 24 hr capsule TAKE 1 CAPSULE BY MOUTH DAILY. 90 capsule 1  . Divalproex Sodium (DEPAKOTE PO) Take 1,000 mg by mouth at bedtime.    Marland Kitchen  FINACEA 15 % FOAM APPLY TOPICALLY DAILY AS NEEDED 50 g 3  . fluticasone (FLONASE) 50 MCG/ACT nasal spray Place 2 sprays into the nose daily as needed. For allergies    . lidocaine (XYLOCAINE) 2 % jelly Apply topically as needed (for dysparunia). 30 mL 12  . nitrofurantoin, macrocrystal-monohydrate, (MACROBID) 100 MG capsule Take 1 capsule (100 mg total) by mouth once as needed (post coital). 30 capsule 8  . pramipexole (MIRAPEX) 0.25 MG tablet Take 0.25 mg by mouth 4 (four) times daily.   0  . Probiotic Product (PROBIOTIC DAILY PO) Take 1 tablet by mouth daily.     . propranolol (INDERAL) 10 MG tablet Take 20 mg by mouth daily as needed (shakiness). Reported on 05/17/2015  1  . QUEtiapine (SEROQUEL) 300 MG tablet     . rivaroxaban (XARELTO) 10 MG TABS tablet Take 1 tablet (10 mg total) by mouth daily. 90 tablet 2  . tretinoin (RETIN-A) 0.05 % cream Apply  topically at bedtime. 45 g 3   No current facility-administered medications for this visit.     Family History  Problem Relation Age of Onset  . Lung cancer Mother   . Hypertension Mother   . Hyperlipidemia Mother   . Cancer Father     Esophageal cancer  . Hyperlipidemia Father   . Depression Father   . Cancer Brother   . Pulmonary embolism Brother   . Colon cancer Paternal Uncle   . Sarcoidosis Other   . Testicular cancer Other     Review of Systems  Constitutional:       Weight gain   HENT: Negative.   Eyes: Negative.   Respiratory: Negative.   Cardiovascular: Negative.   Gastrointestinal: Negative.   Endocrine: Negative.   Musculoskeletal:       Leg swelling  Skin: Negative.   Allergic/Immunologic: Negative.   Neurological: Negative.   Psychiatric/Behavioral: Negative.     Exam:   BP 110/60 (BP Location: Right Arm, Patient Position: Sitting, Cuff Size: Normal)   Pulse 80   Resp 16   Ht 5\' 9"  (1.753 m)   Wt 171 lb (77.6 kg)   LMP 05/11/2012   BMI 25.25 kg/m   Weight change: @WEIGHTCHANGE @ Height:   Height: 5\' 9"  (175.3 cm)  Ht Readings from Last 3 Encounters:  06/17/16 5\' 9"  (1.753 m)  05/01/16 5\' 9"  (1.753 m)  12/19/15 5\' 9"  (1.753 m)    General appearance: alert, cooperative and appears stated age Head: Normocephalic, without obvious abnormality, atraumatic Neck: no adenopathy, supple, symmetrical, trachea midline and thyroid normal to inspection and palpation Lungs: clear to auscultation bilaterally Cardiovascular: regular rate and rhythm Breasts: normal appearance, no masses or tenderness Abdomen: soft, non-tender; bowel sounds normal; no masses,  no organomegaly Extremities: extremities normal, atraumatic, no cyanosis or edema Skin: Skin color, texture, turgor normal. No rashes or lesions Lymph nodes: Cervical, supraclavicular, and axillary nodes normal. No abnormal inguinal nodes palpated Neurologic: Grossly normal   Pelvic: External  genitalia:  no lesions              Urethra:  normal appearing urethra with no masses, tenderness or lesions              Bartholins and Skenes: normal                 Vagina: atrophic appearing vagina with normal color and discharge, no lesions              Cervix: no lesions  Bimanual Exam:  Uterus:  normal size, contour, position, consistency, mobility, non-tender              Adnexa: no mass, fullness, tenderness               Rectovaginal: Confirms               Anus:  normal sphincter tone, no lesions  Chaperone was present for exam.  A:  Well Woman with normal exam  Dyspareunia, can't take vaginal estrogen secondary to clotting issues (unless she stays on the anticoagulant)  Dyspareunia, vaginal atrophy  Bipolar, seeing psychiatry  P:   Pap with reflex pap (patient request)  Mammogram and Colonoscopy are UTD  Labs and immunizations with primary  Discussed possible Mona-lisa touch  Discussed breast self exam  Discussed calcium and vit D intake

## 2016-06-17 NOTE — Progress Notes (Deleted)
56 y.o. G0P0 Married  Caucasian Fe here for annual exam.    Patient's last menstrual period was 05/11/2012.          Sexually active: {yes no:314532}  The current method of family planning is {contraception:315051}.    Exercising: {yes no:314532}  {types:19826} Smoker:  no  Health Maintenance: Pap:  06/05/15, negative with negative HR HPV  05/13/11, negative with neg HR HPV (wants pap Yearly)  (Remote CIN II with cryo 1982) History of Abnormal Pap: {YES NO:22349} MMG:  10/19/15 BIRADS 1 negative/density c Self Breast exams: {YES NO:22349} Colonoscopy:  *** BMD:   never TDaP:  05/13/11 Hep C and HIV: *** Labs: ***   reports that she quit smoking about 20 years ago. Her smoking use included Cigarettes. She has a 25.00 pack-year smoking history. She has never used smokeless tobacco. She reports that she drinks about 1.8 oz of alcohol per week . She reports that she does not use drugs.  Past Medical History:  Diagnosis Date  . Anxiety   . Atrial fibrillation ([place])    a. Event monitor 2013 - PACs/bradycardia/SVT/short run of atrial fib by event monitor.   Marland Kitchen BIPOLAR DISORDER UNSPECIFIED   . Bradycardia   . Bursitis of hip 09/1999   Bilateral - Dr. Berenice Primas  . DEPRESSION   . Fistula of female genitalia 03/29/2007   enterovesical fistula, not reapired  . GERD   . Headache(784.0)    was with menstrual cycle. no longer a problem  . HYPERLIPIDEMIA   . Hypertension   . Hypertension   . IRRITABLE BOWEL SYNDROME, HX OF   . Menopausal state 01/2012   FSH = 88.5  . Mitral valve prolapse 12/17/2000   a. dx 1980s, most recent echo did not demonstrate this.  Marland Kitchen MYALGIA   . Normal coronary arteries    a. by cardiac CT 2013.  Marland Kitchen PAT (paroxysmal atrial tachycardia) ([place])   . PE (pulmonary embolism) 02/09/2015   a. Bilateral PEs 01/2015 when d-dimer 0.69, CP lying on left side. Followed by heme-onc - + lupus anticoagulant, mildly depressed protein S. Dr. Marin Olp is not certain if her hypercoagulable  studies are significant for a thrombophilic state.  . Premature atrial contractions   . Shortness of breath    Pulmonary eval 05/2002  . Thyroid nodule 2014  . Tremors of nervous system     Past Surgical History:  Procedure Laterality Date  . COLONOSCOPY    . Lipoma (R) side  1990   Right back    Current Outpatient Prescriptions  Medication Sig Dispense Refill  . ALPRAZolam (XANAX) 0.5 MG tablet Take 0.25-0.5 mg by mouth at bedtime as needed for sleep.    Marland Kitchen aspirin EC 81 MG tablet Take 81 mg by mouth daily.    Marland Kitchen BIOTIN PO Take 1 tablet by mouth daily.    . Cholecalciferol (VITAMIN D3) 1000 UNITS CAPS Take 1,000 Units by mouth daily.     Marland Kitchen dexlansoprazole (DEXILANT) 60 MG capsule Take 60 mg by mouth daily.    Marland Kitchen diltiazem (CARDIZEM CD) 180 MG 24 hr capsule TAKE 1 CAPSULE BY MOUTH DAILY. 90 capsule 1  . Divalproex Sodium (DEPAKOTE PO) Take 1,000 mg by mouth at bedtime.    Marland Kitchen FINACEA 15 % FOAM APPLY TOPICALLY DAILY AS NEEDED 50 g 3  . fluticasone (FLONASE) 50 MCG/ACT nasal spray Place 2 sprays into the nose daily as needed. For allergies    . lidocaine (XYLOCAINE) 2 % jelly Apply topically as  needed (for dysparunia). 30 mL 12  . nitrofurantoin, macrocrystal-monohydrate, (MACROBID) 100 MG capsule Take 1 capsule (100 mg total) by mouth once as needed (post coital). 30 capsule 8  . pramipexole (MIRAPEX) 0.25 MG tablet Take 1 mg by mouth at bedtime.   0  . Probiotic Product (PROBIOTIC DAILY PO) Take 1 tablet by mouth daily.     . propranolol (INDERAL) 10 MG tablet Take 20 mg by mouth daily as needed (shakiness). Reported on 05/17/2015  1  . QUEtiapine (SEROQUEL XR) 400 MG 24 hr tablet Take 200 mg by mouth at bedtime.   0  . rivaroxaban (XARELTO) 10 MG TABS tablet Take 1 tablet (10 mg total) by mouth daily. 90 tablet 2  . tretinoin (RETIN-A) 0.05 % cream Apply topically at bedtime. 45 g 3   No current facility-administered medications for this visit.     Family History  Problem Relation Age  of Onset  . Lung cancer Mother   . Hypertension Mother   . Hyperlipidemia Mother   . Cancer Father     Esophageal cancer  . Hyperlipidemia Father   . Depression Father   . Cancer Brother   . Colon cancer Paternal Uncle   . Sarcoidosis Other   . Testicular cancer Other     ROS:  Pertinent items are noted in HPI.  Otherwise, a comprehensive ROS was negative.  Exam:   LMP 05/11/2012    Ht Readings from Last 3 Encounters:  05/01/16 5\' 9"  (1.753 m)  12/19/15 5\' 9"  (1.753 m)  11/13/15 5\' 9"  (1.753 m)    General appearance: alert, cooperative and appears stated age Head: Normocephalic, without obvious abnormality, atraumatic Neck: no adenopathy, supple, symmetrical, trachea midline and thyroid {EXAM; THYROID:18604} Lungs: clear to auscultation bilaterally Breasts: {Exam; breast:13139::"normal appearance, no masses or tenderness"} Heart: regular rate and rhythm Abdomen: soft, non-tender; no masses,  no organomegaly Extremities: extremities normal, atraumatic, no cyanosis or edema Skin: Skin color, texture, turgor normal. No rashes or lesions Lymph nodes: Cervical, supraclavicular, and axillary nodes normal. No abnormal inguinal nodes palpated Neurologic: Grossly normal   Pelvic: External genitalia:  no lesions              Urethra:  normal appearing urethra with no masses, tenderness or lesions              Bartholin's and Skene's: normal                 Vagina: normal appearing vagina with normal color and discharge, no lesions              Cervix: {exam; cervix:14595}              Pap taken: {yes no:314532} Bimanual Exam:  Uterus:  {exam; uterus:12215}              Adnexa: {exam; adnexa:12223}               Rectovaginal: Confirms               Anus:  normal sphincter tone, no lesions  Chaperone present: ***  A:  Well Woman with normal exam  P:   Reviewed health and wellness pertinent to exam  Pap smear: {YES NO:22349}  {plan; gyn:5269::"mammogram","pap smear","return  annually or prn"}  An After Visit Summary was printed and given to the patient.

## 2016-06-17 NOTE — Patient Instructions (Signed)

## 2016-06-18 LAB — IPS PAP TEST WITH REFLEX TO HPV

## 2016-06-20 DIAGNOSIS — J439 Emphysema, unspecified: Secondary | ICD-10-CM

## 2016-06-20 HISTORY — DX: Emphysema, unspecified: J43.9

## 2016-06-20 MED FILL — DEXILANT DR 60 MG CAPSULE: 60 | 30 days supply | Qty: 30 | Fill #3

## 2016-06-24 ENCOUNTER — Ambulatory Visit (INDEPENDENT_AMBULATORY_CARE_PROVIDER_SITE_OTHER): Payer: 59 | Admitting: Psychology

## 2016-06-24 DIAGNOSIS — F332 Major depressive disorder, recurrent severe without psychotic features: Secondary | ICD-10-CM | POA: Diagnosis not present

## 2016-07-02 DIAGNOSIS — F3181 Bipolar II disorder: Secondary | ICD-10-CM | POA: Diagnosis not present

## 2016-07-03 MED FILL — PRAMIPEXOLE 1 MG TABLET: 1 | 90 days supply | Qty: 90 | Fill #0

## 2016-07-03 MED FILL — QUETIAPINE ER 400 MG TABLET: 400 | 90 days supply | Qty: 90 | Fill #0

## 2016-07-03 MED FILL — DIVALPROEX SOD ER 500 MG TA: 500 | 90 days supply | Qty: 180 | Fill #0

## 2016-07-15 DIAGNOSIS — L728 Other follicular cysts of the skin and subcutaneous tissue: Secondary | ICD-10-CM | POA: Diagnosis not present

## 2016-07-15 DIAGNOSIS — L821 Other seborrheic keratosis: Secondary | ICD-10-CM | POA: Diagnosis not present

## 2016-07-15 DIAGNOSIS — K130 Diseases of lips: Secondary | ICD-10-CM | POA: Diagnosis not present

## 2016-07-15 DIAGNOSIS — D239 Other benign neoplasm of skin, unspecified: Secondary | ICD-10-CM | POA: Diagnosis not present

## 2016-07-15 MED FILL — KETOCONAZOLE 2% CREAM: 2 | 20 days supply | Qty: 60 | Fill #0

## 2016-07-16 ENCOUNTER — Other Ambulatory Visit (HOSPITAL_BASED_OUTPATIENT_CLINIC_OR_DEPARTMENT_OTHER): Payer: 59

## 2016-07-16 ENCOUNTER — Ambulatory Visit (HOSPITAL_BASED_OUTPATIENT_CLINIC_OR_DEPARTMENT_OTHER): Payer: 59 | Admitting: Hematology & Oncology

## 2016-07-16 VITALS — BP 112/68 | HR 116 | Temp 97.4°F | Resp 17 | Wt 164.0 lb

## 2016-07-16 DIAGNOSIS — I2782 Chronic pulmonary embolism: Secondary | ICD-10-CM

## 2016-07-16 DIAGNOSIS — Z7901 Long term (current) use of anticoagulants: Secondary | ICD-10-CM | POA: Diagnosis not present

## 2016-07-16 DIAGNOSIS — D6862 Lupus anticoagulant syndrome: Secondary | ICD-10-CM

## 2016-07-16 DIAGNOSIS — I2699 Other pulmonary embolism without acute cor pulmonale: Secondary | ICD-10-CM

## 2016-07-16 LAB — CBC WITH DIFFERENTIAL (CANCER CENTER ONLY)
BASO#: 0 10*3/uL (ref 0.0–0.2)
BASO%: 0.4 % (ref 0.0–2.0)
EOS%: 1.5 % (ref 0.0–7.0)
Eosinophils Absolute: 0.1 10*3/uL (ref 0.0–0.5)
HCT: 45.3 % (ref 34.8–46.6)
HGB: 15.2 g/dL (ref 11.6–15.9)
LYMPH#: 2.5 10*3/uL (ref 0.9–3.3)
LYMPH%: 48.9 % — ABNORMAL HIGH (ref 14.0–48.0)
MCH: 29.4 pg (ref 26.0–34.0)
MCHC: 33.6 g/dL (ref 32.0–36.0)
MCV: 88 fL (ref 81–101)
MONO#: 0.4 10*3/uL (ref 0.1–0.9)
MONO%: 7.5 % (ref 0.0–13.0)
NEUT#: 2.2 10*3/uL (ref 1.5–6.5)
NEUT%: 41.7 % (ref 39.6–80.0)
Platelets: 160 10*3/uL (ref 145–400)
RBC: 5.17 10*6/uL (ref 3.70–5.32)
RDW: 15.1 % (ref 11.1–15.7)
WBC: 5.2 10*3/uL (ref 3.9–10.0)

## 2016-07-16 LAB — CMP (CANCER CENTER ONLY)
ALT(SGPT): 35 U/L (ref 10–47)
AST: 29 U/L (ref 11–38)
Albumin: 3.9 g/dL (ref 3.3–5.5)
Alkaline Phosphatase: 64 U/L (ref 26–84)
BUN, Bld: 15 mg/dL (ref 7–22)
CO2: 26 mEq/L (ref 18–33)
Calcium: 9.3 mg/dL (ref 8.0–10.3)
Chloride: 102 mEq/L (ref 98–108)
Creat: 1.6 mg/dl — ABNORMAL HIGH (ref 0.6–1.2)
Glucose, Bld: 114 mg/dL (ref 73–118)
Potassium: 4 mEq/L (ref 3.3–4.7)
Sodium: 136 mEq/L (ref 128–145)
Total Bilirubin: 0.6 mg/dl (ref 0.20–1.60)
Total Protein: 7.6 g/dL (ref 6.4–8.1)

## 2016-07-16 NOTE — Progress Notes (Signed)
Hematology and Oncology Follow Up Visit  Beverly Cole [id number] 1960/04/14 56 y.o. 07/16/2016   Principle Diagnosis:   Bilateral pulmonary embolism  Positive lupus anticoagulant  Mildly depressed protein S level  Current Therapy:    Xarelto 20 mg by mouth daily-finish 1 year of therapy in December 2017  Xarelto 10 mg by mouth daily-to complete 1 year in December 2018  EC ASA 81 mg po q day     Interim History:  Beverly Cole is back for follow-up. Unfortunately, she had itself time on their trip to [place]. The plane flight was very taxing on her. She has back issues and the long plane flight did not help this. She had a lot of swelling in her legs. She even had on compression stockings. Once she and her husband got to [place], she enjoyed it. They went swimming. Unfortunately they did not see any sea turtles  Fortunately, the volcano had not yet blown on the [place] of [place]  She is followed by rheumatology.  She's had no bleeding. She's had no change in bowel or bladder habits. Her psychiatric medications cause some constipation.  Currently, her performance status is ECOG 0.  Medications:  Current Outpatient Prescriptions:  .  ALPRAZolam (XANAX) 0.5 MG tablet, Take 0.25-0.5 mg by mouth at bedtime as needed for sleep., Disp: , Rfl:  .  aspirin EC 81 MG tablet, Take 81 mg by mouth daily., Disp: , Rfl:  .  BIOTIN PO, Take 1 tablet by mouth daily., Disp: , Rfl:  .  Cholecalciferol (VITAMIN D3) 1000 UNITS CAPS, Take 1,000 Units by mouth daily. , Disp: , Rfl:  .  dexlansoprazole (DEXILANT) 60 MG capsule, Take 60 mg by mouth daily., Disp: , Rfl:  .  diltiazem (CARDIZEM CD) 180 MG 24 hr capsule, TAKE 1 CAPSULE BY MOUTH DAILY., Disp: 90 capsule, Rfl: 1 .  Divalproex Sodium (DEPAKOTE PO), Take 1,000 mg by mouth at bedtime., Disp: , Rfl:  .  FINACEA 15 % FOAM, APPLY TOPICALLY DAILY AS NEEDED, Disp: 50 g, Rfl: 3 .  fluticasone (FLONASE) 50 MCG/ACT nasal spray, Place 2 sprays into the  nose daily as needed. For allergies, Disp: , Rfl:  .  lidocaine (XYLOCAINE) 2 % jelly, Apply topically as needed (for dysparunia)., Disp: 30 mL, Rfl: 12 .  nitrofurantoin, macrocrystal-monohydrate, (MACROBID) 100 MG capsule, Take 1 capsule (100 mg total) by mouth once as needed (post coital)., Disp: 30 capsule, Rfl: 8 .  pramipexole (MIRAPEX) 0.25 MG tablet, Take 1 mg by mouth 4 (four) times daily. , Disp: , Rfl: 0 .  Probiotic Product (PROBIOTIC DAILY PO), Take 1 tablet by mouth daily. , Disp: , Rfl:  .  propranolol (INDERAL) 10 MG tablet, Take 20 mg by mouth daily as needed (shakiness). Reported on 05/17/2015, Disp: , Rfl: 1 .  QUEtiapine (SEROQUEL) 300 MG tablet, 400 mg. , Disp: , Rfl:  .  rivaroxaban (XARELTO) 10 MG TABS tablet, Take 1 tablet (10 mg total) by mouth daily., Disp: 90 tablet, Rfl: 2 .  tretinoin (RETIN-A) 0.05 % cream, Apply topically at bedtime., Disp: 45 g, Rfl: 3  Allergies:  Allergies  Allergen Reactions  . Cariprazine Other (See Comments)    Patient becomes suicidal when taking this medication.   . Lopressor [Metoprolol] Other (See Comments)    Hallucinations hair falls out  . Nickel Rash    Past Medical History, Surgical history, Social history, and Family History were reviewed and updated.  Review of Systems: As above  Physical Exam:  weight is 164 lb (74.4 kg). Her oral temperature is 97.4 F (36.3 C). Her blood pressure is 112/68 and her pulse is 116 (abnormal). Her respiration is 17 and oxygen saturation is 99%.   Wt Readings from Last 3 Encounters:  07/16/16 164 lb (74.4 kg)  06/17/16 171 lb (77.6 kg)  05/01/16 171 lb 6.4 oz (77.7 kg)     Well-developed and well-nourished white female in no obvious distress. Head exam shows no ocular or oral lesions. She has no palpable cervical or supraclavicular lymph nodes. Lungs are clear bilaterally. Cardiac exam regular rate and rhythm with no murmurs, rubs or bruits. Abdomen is soft. She has good bowel sounds.  There is no fluid wave. There is no palpable liver or spleen tip. Back exam shows no tenderness over the spine, ribs or hips. Extremities shows no clubbing, cyanosis or edema. No venous cords noted in her legs. She is a negative Homans sign bilaterally. Skin exam shows no rashes, ecchymoses or petechia.  Lab Results  Component Value Date   WBC 5.2 07/16/2016   HGB 15.2 07/16/2016   HCT 45.3 07/16/2016   MCV 88 07/16/2016   PLT 160 07/16/2016     Chemistry      Component Value Date/Time   NA 136 07/16/2016 1001   NA 143 01/16/2016 1005   K 4.0 07/16/2016 1001   K 3.3 (L) 01/16/2016 1005   CL 102 07/16/2016 1001   CO2 26 07/16/2016 1001   CO2 20 (L) 01/16/2016 1005   BUN 15 07/16/2016 1001   BUN 12.6 01/16/2016 1005   CREATININE 1.6 (H) 07/16/2016 1001   CREATININE 0.9 01/16/2016 1005      Component Value Date/Time   CALCIUM 9.3 07/16/2016 1001   CALCIUM 9.5 01/16/2016 1005   ALKPHOS 64 07/16/2016 1001   ALKPHOS 81 01/16/2016 1005   AST 29 07/16/2016 1001   AST 16 01/16/2016 1005   ALT 35 07/16/2016 1001   ALT 24 01/16/2016 1005   BILITOT 0.60 07/16/2016 1001   BILITOT 0.59 01/16/2016 1005         Impression and Plan: Ms. Beverly Cole is a 56 year old white female. She had a pulmonary embolism. This was a bilateral pulmonary embolism. She had been on hormonal replacement therapy.  She's doing well with low-dose Xarelto.   I feel that that she had a tough time on her trip to [place]. I know that flying for 8 hours straight can really take a lot out of you.   We will still plan to have her finish of low-dose Xarelto in December. Hopefully, we can get her off formal anticoagulation at that time.   I probably will put her on full dose aspirin.  I will see her back in December.  Volanda Napoleon, MD 6/6/201811:26 AM

## 2016-07-17 MED FILL — DEXILANT DR 60 MG CAPSULE: 60 | 30 days supply | Qty: 30 | Fill #4

## 2016-07-18 LAB — LUPUS ANTICOAGULANT PANEL
PTT-LA: 40.2 s (ref 0.0–51.9)
dRVVT Confirm: 1.9 ratio — ABNORMAL HIGH (ref 0.8–1.2)
dRVVT Mix: 64.5 s — ABNORMAL HIGH (ref 0.0–47.0)
dRVVT: 119.1 s — ABNORMAL HIGH (ref 0.0–47.0)

## 2016-07-21 ENCOUNTER — Encounter (HOSPITAL_COMMUNITY): Payer: Self-pay

## 2016-07-21 ENCOUNTER — Emergency Department (HOSPITAL_COMMUNITY)
Admission: EM | Admit: 2016-07-21 | Discharge: 2016-07-22 | Disposition: A | Discharge status: Home / Self Care | Payer: 59 | Attending: Emergency Medicine | Admitting: Emergency Medicine

## 2016-07-21 DIAGNOSIS — I951 Orthostatic hypotension: Secondary | ICD-10-CM | POA: Insufficient documentation

## 2016-07-21 DIAGNOSIS — I10 Essential (primary) hypertension: Secondary | ICD-10-CM | POA: Diagnosis not present

## 2016-07-21 DIAGNOSIS — Z7982 Long term (current) use of aspirin: Secondary | ICD-10-CM | POA: Insufficient documentation

## 2016-07-21 DIAGNOSIS — R404 Transient alteration of awareness: Secondary | ICD-10-CM | POA: Diagnosis not present

## 2016-07-21 DIAGNOSIS — E860 Dehydration: Secondary | ICD-10-CM | POA: Diagnosis not present

## 2016-07-21 DIAGNOSIS — Z79899 Other long term (current) drug therapy: Secondary | ICD-10-CM | POA: Diagnosis not present

## 2016-07-21 DIAGNOSIS — Z87891 Personal history of nicotine dependence: Secondary | ICD-10-CM | POA: Diagnosis not present

## 2016-07-21 DIAGNOSIS — R55 Syncope and collapse: Secondary | ICD-10-CM

## 2016-07-21 DIAGNOSIS — Z7901 Long term (current) use of anticoagulants: Secondary | ICD-10-CM | POA: Diagnosis not present

## 2016-07-21 HISTORY — DX: Emphysema, unspecified: J43.9

## 2016-07-21 LAB — CBC WITH DIFFERENTIAL/PLATELET
Basophils Absolute: 0 10*3/uL (ref 0.0–0.1)
Basophils Relative: 0 %
Eosinophils Absolute: 0.1 10*3/uL (ref 0.0–0.7)
Eosinophils Relative: 2 %
HCT: 40.3 % (ref 36.0–46.0)
Hemoglobin: 12.8 g/dL (ref 12.0–15.0)
Lymphocytes Relative: 38 %
Lymphs Abs: 2.2 10*3/uL (ref 0.7–4.0)
MCH: 28.3 pg (ref 26.0–34.0)
MCHC: 31.8 g/dL (ref 30.0–36.0)
MCV: 89 fL (ref 78.0–100.0)
Monocytes Absolute: 0.4 10*3/uL (ref 0.1–1.0)
Monocytes Relative: 7 %
Neutro Abs: 3.1 10*3/uL (ref 1.7–7.7)
Neutrophils Relative %: 53 %
Platelets: 167 10*3/uL (ref 150–400)
RBC: 4.53 MIL/uL (ref 3.87–5.11)
RDW: 14.8 % (ref 11.5–15.5)
WBC: 5.7 10*3/uL (ref 4.0–10.5)

## 2016-07-21 LAB — BASIC METABOLIC PANEL
Anion gap: 10 (ref 5–15)
BUN: 11 mg/dL (ref 6–20)
CO2: 21 mmol/L — ABNORMAL LOW (ref 22–32)
Calcium: 9 mg/dL (ref 8.9–10.3)
Chloride: 111 mmol/L (ref 101–111)
Creatinine, Ser: 0.87 mg/dL (ref 0.44–1.00)
GFR calc Af Amer: >60 mL/min (ref >60)
GFR calc non Af Amer: >60 mL/min (ref >60)
Glucose, Bld: 134 mg/dL — ABNORMAL HIGH (ref 65–99)
Potassium: 3.4 mmol/L — ABNORMAL LOW (ref 3.5–5.1)
Sodium: 142 mmol/L (ref 135–145)

## 2016-07-21 MED ORDER — SODIUM CHLORIDE 0.9 % IV BOLUS (SEPSIS)
1000.0000 mL | Freq: Once | INTRAVENOUS | Status: AC
  Administered 2016-07-21: 1000 mL via INTRAVENOUS

## 2016-07-21 NOTE — ED Notes (Signed)
EKG delayed, due to pt wanting to wait for room to come available.

## 2016-07-21 NOTE — ED Provider Notes (Signed)
[facility] DEPT Provider Note   CSN: [id number] Arrival date & time: 07/21/16  2221  By signing my name below, I, Jeanell Sparrow, attest that this documentation has been prepared under the direction and in the presence of Farah Benish, Barbette Hair, MD. Electronically Signed: Jeanell Sparrow, Scribe. 07/21/2016. 11:16 PM.  History   Chief Complaint Chief Complaint  Patient presents with  . Near Syncope   The history is provided by the patient. No language interpreter was used.   HPI Comments: Beverly Cole is a 56 y.o. female with a PMHx of Afib, PE, and SVT who presents to the Emergency Department complaining of near-syncope that occurred this evening. She went from seated to standing and took some steps before feeling lightheaded. Her husband prevented her fall. No head injury or LOC. She reports associated SOB and heart palpitations during the episode. She currently denies any symptoms. She admits to a recent trip to [place]. She is compliant on Xarelto. She only had 2 cups of coffee today, no food. Thinks she may be dehydrated. Denies any recent alcohol use, chest pain, or other complaints at this time. Denies room spinning dizziness.  Past Medical History:  Diagnosis Date  . Anxiety   . Atrial fibrillation ([place])    a. Event monitor 2013 - PACs/bradycardia/SVT/short run of atrial fib by event monitor.   Marland Kitchen BIPOLAR DISORDER UNSPECIFIED   . Bradycardia   . Bursitis of hip 09/1999   Bilateral - Dr. Berenice Primas  . DEPRESSION   . Emphysema lung ([place]) 06/20/2016   pt states pulmonalongist stated started recently  . GERD   . Headache(784.0)    was with menstrual cycle. no longer a problem  . HYPERLIPIDEMIA   . Hypertension   . Hypertension   . IRRITABLE BOWEL SYNDROME, HX OF   . Menopausal state 01/2012   FSH = 88.5  . Mitral valve prolapse 12/17/2000   a. dx 1980s, most recent echo did not demonstrate this.  Marland Kitchen MYALGIA   . Normal coronary arteries    a. by cardiac CT 2013.  Marland Kitchen PAT  (paroxysmal atrial tachycardia) ([place])   . PE (pulmonary embolism) 02/09/2015   a. Bilateral PEs 01/2015 when d-dimer 0.69, CP lying on left side. Followed by heme-onc - + lupus anticoagulant, mildly depressed protein S. Dr. Marin Olp is not certain if her hypercoagulable studies are significant for a thrombophilic state.  . Premature atrial contractions   . Shortness of breath    Pulmonary eval 05/2002  . Thyroid nodule 2014  . Tremors of nervous system     Patient Active Problem List   Diagnosis Date Noted  . Shortness of breath 04/25/2016  . Bipolar disorder ([place]) 05/19/2015  . Pulmonary embolus ([place]) 02/10/2015  . Chest pain 02/09/2015  . Severe recurrent major depression without psychotic features ([place])   . Goiter, nontoxic, multinodular 12/18/2014  . Multinodular goiter 10/27/2012  . Tremor 05/09/2012  . SVT (supraventricular tachycardia) ([place]) 12/03/2011  . Hypertension 08/26/2010  . Hyperlipidemia 03/13/2009  . Bipolar 2 disorder ([place]) 03/13/2009  . Hypothyroidism 07/06/2008  . GERD 11/05/2006  . ACNE NEC 11/05/2006  . DEPRESSION 07/23/2006    Past Surgical History:  Procedure Laterality Date  . COLONOSCOPY    . Lipoma (R) side  1990   Right back    OB History    Gravida Para Term Preterm AB Living   0 0           SAB TAB Ectopic Multiple Live Births  Home Medications    Prior to Admission medications   Medication Sig Start Date End Date Taking? Authorizing Provider  ALPRAZolam Duanne Moron) 0.5 MG tablet Take 0.25-0.5 mg by mouth at bedtime as needed for sleep.    [provider]  aspirin EC 81 MG tablet Take 81 mg by mouth daily.    [provider]  BIOTIN PO Take 1 tablet by mouth daily.    [provider]  Cholecalciferol (VITAMIN D3) 1000 UNITS CAPS Take 1,000 Units by mouth daily.     [provider]  dexlansoprazole (DEXILANT) 60 MG capsule Take 60 mg by mouth daily.    [provider]  diltiazem  (CARDIZEM CD) 180 MG 24 hr capsule TAKE 1 CAPSULE BY MOUTH DAILY. 05/23/16   Ann Held, DO  Divalproex Sodium (DEPAKOTE PO) Take 1,000 mg by mouth at bedtime.    [provider]  FINACEA 15 % FOAM APPLY TOPICALLY DAILY AS NEEDED 12/17/15   Carollee Herter, Alferd Apa, DO  fluticasone (FLONASE) 50 MCG/ACT nasal spray Place 2 sprays into the nose daily as needed. For allergies    [provider]  lidocaine (XYLOCAINE) 2 % jelly Apply topically as needed (for dysparunia). 06/05/15   Kem Boroughs, FNP  nitrofurantoin, macrocrystal-monohydrate, (MACROBID) 100 MG capsule Take 1 capsule (100 mg total) by mouth once as needed (post coital). 06/05/15   Kem Boroughs, FNP  pramipexole (MIRAPEX) 0.25 MG tablet Take 1 mg by mouth 4 (four) times daily.  06/18/15   [provider]  Probiotic Product (PROBIOTIC DAILY PO) Take 1 tablet by mouth daily.     [provider]  propranolol (INDERAL) 10 MG tablet Take 20 mg by mouth daily as needed (shakiness). Reported on 05/17/2015 11/21/14   [provider]  QUEtiapine (SEROQUEL) 300 MG tablet 400 mg.  02/11/16   [provider]  rivaroxaban (XARELTO) 10 MG TABS tablet Take 1 tablet (10 mg total) by mouth daily. 04/28/16   Ann Held, DO  tretinoin (RETIN-A) 0.05 % cream Apply topically at bedtime. 11/13/15   Ann Held, DO    Family History Family History  Problem Relation Age of Onset  . Lung cancer Mother   . Hypertension Mother   . Hyperlipidemia Mother   . Cancer Father        Esophageal cancer  . Hyperlipidemia Father   . Depression Father   . Cancer Brother   . Pulmonary embolism Brother   . Colon cancer Paternal Uncle   . Sarcoidosis Other   . Testicular cancer Other     Social History Social History  Substance Use Topics  . Smoking status: Former Smoker    Packs/day: 1.00    Years: 25.00    Types: Cigarettes    Quit date: 02/11/1996  . Smokeless tobacco: Never Used       Comment: Married, lives with spouse. 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 Alcohol use 0.6 oz/week    1 Standard drinks or equivalent per week     Allergies   Cariprazine; Lopressor [metoprolol]; and Nickel   Review of Systems Review of Systems  Constitutional: Negative for fever.  Respiratory: Positive for shortness of breath.   Cardiovascular: Positive for palpitations. Negative for chest pain.  Genitourinary: Negative for dysuria.  Neurological: Positive for light-headedness. Negative for dizziness, syncope and weakness.  All other systems reviewed and are negative.    Physical Exam Updated Vital Signs BP Marland Kitchen)  132/59 (BP Location: Right Arm)   Pulse 91   Temp 98 F (36.7 C) (Oral)   Resp 16   LMP 05/11/2012   SpO2 97%   Physical Exam  Constitutional: She is oriented to person, place, and time. She appears well-developed and well-nourished.  HENT:  Head: Normocephalic and atraumatic.  Mucous membranes dry  Eyes: Pupils are equal, round, and reactive to light.  Cardiovascular: Normal rate, regular rhythm and normal heart sounds.   No murmur heard. Pulmonary/Chest: Effort normal and breath sounds normal. No respiratory distress. She has no wheezes.  Abdominal: Soft. There is no tenderness.  Musculoskeletal: She exhibits no edema.  Neurological: She is alert and oriented to person, place, and time.  Cranial nerves II through XII intact, 5 out of 5 strength in all 4 extremities, no dysmetria to finger-nose-finger  Skin: Skin is warm and dry.  Psychiatric: She has a normal mood and affect.  Nursing note and vitals reviewed.    ED Treatments / Results  DIAGNOSTIC STUDIES: Oxygen Saturation is 97% on RA, normal by my interpretation.    COORDINATION OF CARE: 11:20 PM- Pt advised of plan for treatment and pt agrees.  Labs (all labs ordered are listed, but only abnormal results are displayed) Labs Reviewed  BASIC METABOLIC PANEL - Abnormal; Notable for  the following:       Result Value   Potassium 3.4 (*)    CO2 21 (*)    Glucose, Bld 134 (*)    All other components within normal limits  CBC WITH DIFFERENTIAL/PLATELET    EKG  EKG Interpretation  Date/Time:  Monday July 21 2016 22:44:59 EDT Ventricular Rate:  84 PR Interval:    QRS Duration: 82 QT Interval:  352 QTC Calculation: 416 R Axis:   39 Text Interpretation:  Sinus rhythm Borderline T wave abnormalities Confirmed by Thayer Jew [phone]) on 07/21/2016 10:58:55 PM       Radiology No results found.  Procedures Procedures (including critical care time)  Medications Ordered in ED Medications  sodium chloride 0.9 % bolus 1,000 mL (0 mLs Intravenous Stopped 07/22/16 0039)  sodium chloride 0.9 % bolus 1,000 mL (1,000 mLs Intravenous New Bag/Given 07/22/16 0047)     Initial Impression / Assessment and Plan / ED Course  I have reviewed the triage vital signs and the nursing notes.  Pertinent labs & imaging results that were available during my care of the patient were reviewed by me and considered in my medical decision making (see chart for details).    Patient presents with lightheaded near syncope. She is nontoxic. Vital signs reassuring. No evidence of arrhythmia on EKG. She is compliant on Xarelto. Doubt PE as she is hemodynamically stable. She is orthostatic which would suggest dehydration. Patient was given 2 L of fluid and states she feels much better. She is ambulatory without difficulty or assistance. She is otherwise neurologically intact. Doubt central etiology.  After history, exam, and medical workup I feel the patient has been appropriately medically screened and is safe for discharge home. Pertinent diagnoses were discussed with the patient. Patient was given return precautions.   Final Clinical Impressions(s) / ED Diagnoses   Final diagnoses:  Near syncope  Orthostasis  Dehydration    New Prescriptions New Prescriptions   No medications on  file   I personally performed the services described in this documentation, which was scribed in my presence. The recorded information has been reviewed and is accurate.     Quadasia Newsham, Barbette Hair,  MD 07/22/16 0210

## 2016-07-21 NOTE — ED Notes (Signed)
PA student at bedside.

## 2016-07-21 NOTE — ED Triage Notes (Signed)
BIB [facility] pt woke up from the bed and took a couple of steps and felt like she was going to pass out, husband helped her to the floor. PT denize any LOC, pt states she felt SOB and some palpitations when walking. PT has hx of afib, PE, and SVT

## 2016-07-22 DIAGNOSIS — Z79899 Other long term (current) drug therapy: Secondary | ICD-10-CM | POA: Diagnosis not present

## 2016-07-22 DIAGNOSIS — Z7901 Long term (current) use of anticoagulants: Secondary | ICD-10-CM | POA: Diagnosis not present

## 2016-07-22 DIAGNOSIS — I10 Essential (primary) hypertension: Secondary | ICD-10-CM | POA: Diagnosis not present

## 2016-07-22 DIAGNOSIS — E860 Dehydration: Secondary | ICD-10-CM | POA: Diagnosis not present

## 2016-07-22 DIAGNOSIS — Z87891 Personal history of nicotine dependence: Secondary | ICD-10-CM | POA: Diagnosis not present

## 2016-07-22 DIAGNOSIS — I951 Orthostatic hypotension: Secondary | ICD-10-CM | POA: Diagnosis not present

## 2016-07-22 DIAGNOSIS — Z7982 Long term (current) use of aspirin: Secondary | ICD-10-CM | POA: Diagnosis not present

## 2016-07-22 MED ORDER — SODIUM CHLORIDE 0.9 % IV BOLUS (SEPSIS)
1000.0000 mL | Freq: Once | INTRAVENOUS | Status: AC
  Administered 2016-07-22: 1000 mL via INTRAVENOUS

## 2016-07-22 NOTE — Discharge Instructions (Signed)
Make sure you are drinking enough fluids.

## 2016-07-22 NOTE — ED Notes (Signed)
Pt was able to successfully ambulate to the bathroom to void

## 2016-07-22 NOTE — ED Notes (Signed)
Pt is ambulating to the bathroom to void.  She has had ginger ale and crackers and tolerated both well

## 2016-07-30 NOTE — Telephone Encounter (Signed)
closed

## 2016-08-19 MED FILL — DEXILANT DR 60 MG CAPSULE: 60 | 30 days supply | Qty: 30 | Fill #5

## 2016-08-25 MED FILL — CARTIA XT 180 MG CAPSULE SA: 180 | 90 days supply | Qty: 90 | Fill #1

## 2016-08-25 MED FILL — XARELTO 10 MG TABLET: 10 | 90 days supply | Qty: 90 | Fill #1

## 2016-08-27 DIAGNOSIS — F3181 Bipolar II disorder: Secondary | ICD-10-CM | POA: Diagnosis not present

## 2016-08-28 MED FILL — BUPROPION HCL XL 150 MG TAB: 150 | 18 days supply | Qty: 30 | Fill #0

## 2016-08-28 MED FILL — PRAMIPEXOLE 1 MG TABLET: 1 | 90 days supply | Qty: 180 | Fill #0

## 2016-08-29 MED FILL — QUETIAPINE ER 300 MG TABLET: 300 | 10 days supply | Qty: 20 | Fill #0

## 2016-09-03 ENCOUNTER — Ambulatory Visit (INDEPENDENT_AMBULATORY_CARE_PROVIDER_SITE_OTHER): Payer: 59 | Admitting: Psychology

## 2016-09-03 DIAGNOSIS — F332 Major depressive disorder, recurrent severe without psychotic features: Secondary | ICD-10-CM

## 2016-09-08 MED FILL — QUETIAPINE ER 300 MG TABLET: 300 | 30 days supply | Qty: 60 | Fill #1

## 2016-09-08 MED FILL — BUPROPION HCL XL 300 MG TAB: 300 | 90 days supply | Qty: 90 | Fill #0

## 2016-09-08 MED FILL — ALPRAZolam 0.25 MG TABS: 0.25 | 15 days supply | Qty: 60 | Fill #0

## 2016-09-09 ENCOUNTER — Other Ambulatory Visit: Payer: Self-pay | Admitting: Nurse Practitioner

## 2016-09-09 DIAGNOSIS — Z1231 Encounter for screening mammogram for malignant neoplasm of breast: Secondary | ICD-10-CM

## 2016-09-10 ENCOUNTER — Telehealth: Payer: Self-pay | Admitting: Obstetrics and Gynecology

## 2016-09-10 NOTE — Telephone Encounter (Signed)
Left message on voicemail to call and reschedule cancelled appointment. Mail letter °

## 2016-09-22 MED FILL — DEXILANT DR 60 MG CAPSULE: 60 | 30 days supply | Qty: 30 | Fill #6

## 2016-09-29 MED FILL — DIVALPROEX SOD ER 500 MG TA: 500 | 90 days supply | Qty: 180 | Fill #1

## 2016-10-01 ENCOUNTER — Ambulatory Visit (INDEPENDENT_AMBULATORY_CARE_PROVIDER_SITE_OTHER): Payer: 59 | Admitting: Psychology

## 2016-10-01 DIAGNOSIS — F331 Major depressive disorder, recurrent, moderate: Secondary | ICD-10-CM | POA: Diagnosis not present

## 2016-10-09 DIAGNOSIS — F3181 Bipolar II disorder: Secondary | ICD-10-CM | POA: Diagnosis not present

## 2016-10-17 MED FILL — DEXILANT DR 60 MG CAPSULE: 60 | 30 days supply | Qty: 30 | Fill #7

## 2016-10-21 ENCOUNTER — Ambulatory Visit: Payer: Self-pay

## 2016-10-28 MED FILL — KETOCONAZOLE 2% CREAM: 2 | 20 days supply | Qty: 60 | Fill #1

## 2016-10-29 ENCOUNTER — Ambulatory Visit (INDEPENDENT_AMBULATORY_CARE_PROVIDER_SITE_OTHER): Payer: 59 | Admitting: Psychology

## 2016-10-29 DIAGNOSIS — F332 Major depressive disorder, recurrent severe without psychotic features: Secondary | ICD-10-CM

## 2016-10-31 MED FILL — PROPRANOLOL 20 MG TABLET: 20 | 25 days supply | Qty: 100 | Fill #0

## 2016-11-12 ENCOUNTER — Ambulatory Visit
Admission: RE | Admit: 2016-11-12 | Discharge: 2016-11-12 | Disposition: A | Discharge status: Home / Self Care | Payer: 59 | Source: Ambulatory Visit | Attending: Nurse Practitioner | Admitting: Nurse Practitioner

## 2016-11-12 DIAGNOSIS — Z1231 Encounter for screening mammogram for malignant neoplasm of breast: Secondary | ICD-10-CM

## 2016-11-19 MED FILL — DEXILANT DR 60 MG CAPSULE: 60 | 30 days supply | Qty: 30 | Fill #8

## 2016-11-20 ENCOUNTER — Other Ambulatory Visit: Payer: Self-pay | Admitting: Family Medicine

## 2016-11-20 DIAGNOSIS — I10 Essential (primary) hypertension: Secondary | ICD-10-CM

## 2016-11-20 MED FILL — CARTIA XT 180 MG CAPSULE SA: 180 | 90 days supply | Qty: 90 | Fill #0

## 2016-11-25 ENCOUNTER — Ambulatory Visit (INDEPENDENT_AMBULATORY_CARE_PROVIDER_SITE_OTHER): Payer: 59 | Admitting: Family Medicine

## 2016-11-25 ENCOUNTER — Encounter: Payer: Self-pay | Admitting: Family Medicine

## 2016-11-25 VITALS — BP 120/80 | HR 70 | Temp 97.8°F | Ht 69.0 in | Wt 169.0 lb

## 2016-11-25 DIAGNOSIS — F3181 Bipolar II disorder: Secondary | ICD-10-CM | POA: Diagnosis not present

## 2016-11-25 DIAGNOSIS — L709 Acne, unspecified: Secondary | ICD-10-CM

## 2016-11-25 DIAGNOSIS — Z0000 Encounter for general adult medical examination without abnormal findings: Secondary | ICD-10-CM

## 2016-11-25 DIAGNOSIS — Z23 Encounter for immunization: Secondary | ICD-10-CM

## 2016-11-25 LAB — POC URINALSYSI DIPSTICK (AUTOMATED)
Bilirubin, UA: NEGATIVE
Blood, UA: NEGATIVE
Glucose, UA: NEGATIVE
Leukocytes, UA: NEGATIVE
Nitrite, UA: NEGATIVE
Spec Grav, UA: 1.025 (ref 1.010–1.025)
Urobilinogen, UA: 1 E.U./dL
pH, UA: 6 (ref 5.0–8.0)

## 2016-11-25 MED ORDER — KETOCONAZOLE 2 % EX CREA: 1.0000 "application " | TOPICAL_CREAM | Freq: Every day | CUTANEOUS | 3 refills | Status: DC

## 2016-11-25 MED FILL — XARELTO 10 MG TABLET: 10 | 90 days supply | Qty: 90 | Fill #2

## 2016-11-25 MED FILL — KETOCONAZOLE 2% CREAM: 2 | 30 days supply | Qty: 60 | Fill #0

## 2016-11-25 NOTE — Patient Instructions (Signed)
Preventive Care 40-64 Years, Female Preventive care refers to lifestyle choices and visits with your health care provider that can promote health and wellness. What does preventive care include?  A yearly physical exam. This is also called an annual well check.  Dental exams once or twice a year.  Routine eye exams. Ask your health care provider how often you should have your eyes checked.  Personal lifestyle choices, including: ? Daily care of your teeth and gums. ? Regular physical activity. ? Eating a healthy diet. ? Avoiding tobacco and drug use. ? Limiting alcohol use. ? Practicing safe sex. ? Taking low-dose aspirin daily starting at age 58. ? Taking vitamin and mineral supplements as recommended by your health care provider. What happens during an annual well check? The services and screenings done by your health care provider during your annual well check will depend on your age, overall health, lifestyle risk factors, and family history of disease. Counseling Your health care provider may ask you questions about your:  Alcohol use.  Tobacco use.  Drug use.  Emotional well-being.  Home and relationship well-being.  Sexual activity.  Eating habits.  Work and work Statistician.  Method of birth control.  Menstrual cycle.  Pregnancy history.  Screening You may have the following tests or measurements:  Height, weight, and BMI.  Blood pressure.  Lipid and cholesterol levels. These may be checked every 5 years, or more frequently if you are over 81 years old.  Skin check.  Lung cancer screening. You may have this screening every year starting at age 78 if you have a 30-pack-year history of smoking and currently smoke or have quit within the past 15 years.  Fecal occult blood test (FOBT) of the stool. You may have this test every year starting at age 65.  Flexible sigmoidoscopy or colonoscopy. You may have a sigmoidoscopy every 5 years or a colonoscopy  every 10 years starting at age 30.  Hepatitis C blood test.  Hepatitis B blood test.  Sexually transmitted disease (STD) testing.  Diabetes screening. This is done by checking your blood sugar (glucose) after you have not eaten for a while (fasting). You may have this done every 1-3 years.  Mammogram. This may be done every 1-2 years. Talk to your health care provider about when you should start having regular mammograms. This may depend on whether you have a family history of breast cancer.  BRCA-related cancer screening. This may be done if you have a family history of breast, ovarian, tubal, or peritoneal cancers.  Pelvic exam and Pap test. This may be done every 3 years starting at age 80. Starting at age 36, this may be done every 5 years if you have a Pap test in combination with an HPV test.  Bone density scan. This is done to screen for osteoporosis. You may have this scan if you are at high risk for osteoporosis.  Discuss your test results, treatment options, and if necessary, the need for more tests with your health care provider. Vaccines Your health care provider may recommend certain vaccines, such as:  Influenza vaccine. This is recommended every year.  Tetanus, diphtheria, and acellular pertussis (Tdap, Td) vaccine. You may need a Td booster every 10 years.  Varicella vaccine. You may need this if you have not been vaccinated.  Zoster vaccine. You may need this after age 5.  Measles, mumps, and rubella (MMR) vaccine. You may need at least one dose of MMR if you were born in  1958 or later. You may also need a second dose.  Pneumococcal 13-valent conjugate (PCV13) vaccine. You may need this if you have certain conditions and were not previously vaccinated.  Pneumococcal polysaccharide (PPSV23) vaccine. You may need one or two doses if you smoke cigarettes or if you have certain conditions.  Meningococcal vaccine. You may need this if you have certain  conditions.  Hepatitis A vaccine. You may need this if you have certain conditions or if you travel or work in places where you may be exposed to hepatitis A.  Hepatitis B vaccine. You may need this if you have certain conditions or if you travel or work in places where you may be exposed to hepatitis B.  Haemophilus influenzae type b (Hib) vaccine. You may need this if you have certain conditions.  Talk to your health care provider about which screenings and vaccines you need and how often you need them. This information is not intended to replace advice given to you by your health care provider. Make sure you discuss any questions you have with your health care provider. Document Released: 02/23/2015 Document Revised: 10/17/2015 Document Reviewed: 11/28/2014 Elsevier Interactive Patient Education  2017 [employer].

## 2016-11-25 NOTE — Progress Notes (Signed)
Subjective:     Beverly Cole is a 56 y.o. female and is here for a comprehensive physical exam. The patient reports no new problems .  Social History   Social History  . Marital status: Married    Spouse name: N/A  . Number of children: 0  . Years of education: N/A   Occupational History  . 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 Smoking status: Former Smoker    Packs/day: 1.00    Years: 25.00    Types: Cigarettes    Quit date: 02/11/1996  . Smokeless tobacco: Never Used     Comment: Married, lives with spouse. 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 Alcohol use 0.6 oz/week    1 Standard drinks or equivalent per week  . Drug use: No  . Sexual activity: Yes    Partners: Male    Birth control/ protection: Post-menopausal   Other Topics Concern  . Not on file   Social History Narrative   Married, lives with spouse in [place]. 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 MAMMOGRAM  11/13/2018  . PAP SMEAR  06/18/2019  . TETANUS/TDAP  05/12/2021  . COLONOSCOPY  11/27/2025  . INFLUENZA VACCINE  Completed  . Hepatitis C Screening  Completed  . HIV Screening  Completed    The following portions of the patient's history were reviewed and updated as appropriate:  She  has a past medical history of Anxiety; Atrial fibrillation ([place]); BIPOLAR DISORDER UNSPECIFIED; Bradycardia; Bursitis of hip (09/1999); DEPRESSION; Emphysema lung ([place]) (06/20/2016); GERD; Headache(784.0); HYPERLIPIDEMIA; Hypertension; Hypertension; IRRITABLE BOWEL SYNDROME, HX OF; Menopausal state (01/2012); Mitral valve prolapse (12/17/2000); MYALGIA; Normal coronary arteries; PAT (paroxysmal atrial tachycardia) ([place]); PE (pulmonary embolism) (02/09/2015); Premature atrial contractions; Shortness of breath; Thyroid nodule (2014); and Tremors of nervous system. She  does not have any  pertinent problems on file. She  has a past surgical history that includes Lipoma (R) side (1990) and Colonoscopy. Her family history includes Cancer in her brother and father; Colon cancer in her paternal uncle; Depression in her father; Hyperlipidemia in her father and mother; Hypertension in her mother; Lung cancer in her mother; Pulmonary embolism in her brother; Sarcoidosis in her other; Testicular cancer in her other. She  reports that she quit smoking about 20 years ago. Her smoking use included Cigarettes. She has a 25.00 pack-year smoking history. She has never used smokeless tobacco. She reports that she drinks about 0.6 oz of alcohol per week . She reports that she does not use drugs. She has a current medication list which includes the following prescription(s): alprazolam, aspirin ec, biotin, cartia xt, vitamin d3, dexlansoprazole, divalproex sodium, finacea, fluticasone, lidocaine, nitrofurantoin (macrocrystal-monohydrate), pramipexole, probiotic product, propranolol, quetiapine, rivaroxaban, tretinoin, and ketoconazole. Current Outpatient Prescriptions on File Prior to Visit  Medication Sig Dispense Refill  . ALPRAZolam (XANAX) 0.5 MG tablet Take 0.25-0.5 mg by mouth at bedtime as needed for sleep.    Marland Kitchen aspirin EC 81 MG tablet Take 81 mg by mouth daily.    Marland Kitchen BIOTIN PO Take 1 tablet by mouth daily.    Marland Kitchen CARTIA XT 180 MG 24 hr capsule TAKE 1 CAPSULE BY MOUTH DAILY. 90 capsule 1  . Cholecalciferol (VITAMIN D3) 1000 UNITS CAPS Take 1,000 Units by mouth daily.     Marland Kitchen  dexlansoprazole (DEXILANT) 60 MG capsule Take 60 mg by mouth daily.    . Divalproex Sodium (DEPAKOTE PO) Take 1,000 mg by mouth at bedtime.    Marland Kitchen FINACEA 15 % FOAM APPLY TOPICALLY DAILY AS NEEDED 50 g 3  . fluticasone (FLONASE) 50 MCG/ACT nasal spray Place 2 sprays into the nose daily as needed. For allergies    . lidocaine (XYLOCAINE) 2 % jelly Apply topically as needed (for dysparunia). 30 mL 12  . nitrofurantoin,  macrocrystal-monohydrate, (MACROBID) 100 MG capsule Take 1 capsule (100 mg total) by mouth once as needed (post coital). 30 capsule 8  . pramipexole (MIRAPEX) 0.25 MG tablet Take 1 mg by mouth 4 (four) times daily.   0  . Probiotic Product (PROBIOTIC DAILY PO) Take 1 tablet by mouth daily.     . propranolol (INDERAL) 10 MG tablet Take 20 mg by mouth daily as needed (shakiness). Reported on 05/17/2015  1  . QUEtiapine (SEROQUEL) 300 MG tablet 400 mg.     . rivaroxaban (XARELTO) 10 MG TABS tablet Take 1 tablet (10 mg total) by mouth daily. 90 tablet 2  . tretinoin (RETIN-A) 0.05 % cream Apply topically at bedtime. 45 g 3   No current facility-administered medications on file prior to visit.    She is allergic to cariprazine; lopressor [metoprolol]; and nickel..  Review of Systems Review of Systems  Constitutional: Negative for activity change, appetite change and fatigue.  HENT: Negative for hearing loss, congestion, tinnitus and ear discharge.  dentist q47m Eyes: Negative for visual disturbance (see optho q1y -- vision corrected to 20/20 with glasses).  Respiratory: Negative for cough, chest tightness and shortness of breath.   Cardiovascular: Negative for chest pain, palpitations and leg swelling.  Gastrointestinal: Negative for abdominal pain, diarrhea, constipation and abdominal distention.  Genitourinary: Negative for urgency, frequency, decreased urine volume and difficulty urinating.  Musculoskeletal: Negative for back pain, arthralgias and gait problem.  Skin: Negative for color change, pallor and rash.  Neurological: Negative for dizziness, light-headedness, numbness and headaches.  Hematological: Negative for adenopathy. Does not bruise/bleed easily.  Psychiatric/Behavioral: Negative for suicidal ideas, confusion, sleep disturbance, self-injury, dysphoric mood, decreased concentration and agitation.       Objective:    BP 120/80   Pulse 70   Temp 97.8 F (36.6 C) (Oral)   Ht  5\' 9"  (1.753 m)   Wt 169 lb (76.7 kg)   LMP 05/11/2012   SpO2 98%   BMI 24.96 kg/m  General appearance: alert, cooperative, appears stated age and no distress Head: Normocephalic, without obvious abnormality, atraumatic Eyes: conjunctivae/corneas clear. PERRL, EOM's intact. Fundi benign. Ears: normal TM's and external ear canals both ears Nose: Nares normal. Septum midline. Mucosa normal. No drainage or sinus tenderness. Throat: lips, mucosa, and tongue normal; teeth and gums normal Neck: no adenopathy, no carotid bruit, no JVD, supple, symmetrical, trachea midline and thyroid not enlarged, symmetric, no tenderness/mass/nodules Back: symmetric, no curvature. ROM normal. No CVA tenderness. Lungs: clear to auscultation bilaterally Breasts: -gyn Heart: regular rate and rhythm, S1, S2 normal, no murmur, click, rub or gallop Abdomen: soft, non-tender; bowel sounds normal; no masses,  no organomegaly Pelvic: deferred--gyn Extremities: extremities normal, atraumatic, no cyanosis or edema Pulses: 2+ and symmetric Skin: Skin color, texture, turgor normal. No rashes or lesions Lymph nodes: Cervical, supraclavicular, and axillary nodes normal. Neurologic: Alert and oriented X 3, normal strength and tone. Normal symmetric reflexes. Normal coordination and gait    Assessment:    Healthy female exam.  Plan:    ghm utd Check labs  See After Visit Summary for Counseling Recommendations    1. Preventative health care See above - Lipid panel - CBC with Differential/Platelet - TSH - Comprehensive metabolic panel - POCT Urinalysis Dipstick (Automated)  2. Need for immunization against influenza   - Flu Vaccine QUAD 6+ mos IM (Fluarix)  3. Acne, unspecified acne type Stable  - ketoconazole (NIZORAL) 2 % cream; Apply 1 application topically daily.  Dispense: 60 g; Refill: 3  4. Bipolar II disorder ([place]) Per psych - Valproic acid level  5. Need for shingles vaccine shingrix  given

## 2016-11-26 ENCOUNTER — Ambulatory Visit (INDEPENDENT_AMBULATORY_CARE_PROVIDER_SITE_OTHER): Payer: 59 | Admitting: Psychology

## 2016-11-26 DIAGNOSIS — Z23 Encounter for immunization: Secondary | ICD-10-CM

## 2016-11-26 DIAGNOSIS — Z0000 Encounter for general adult medical examination without abnormal findings: Secondary | ICD-10-CM | POA: Diagnosis not present

## 2016-11-26 DIAGNOSIS — L709 Acne, unspecified: Secondary | ICD-10-CM | POA: Diagnosis not present

## 2016-11-26 DIAGNOSIS — F3181 Bipolar II disorder: Secondary | ICD-10-CM | POA: Diagnosis not present

## 2016-11-26 DIAGNOSIS — F332 Major depressive disorder, recurrent severe without psychotic features: Secondary | ICD-10-CM | POA: Diagnosis not present

## 2016-11-26 LAB — CBC WITH DIFFERENTIAL/PLATELET
Basophils Absolute: 0.1 10*3/uL (ref 0.0–0.1)
Basophils Relative: 0.8 % (ref 0.0–3.0)
Eosinophils Absolute: 0.1 10*3/uL (ref 0.0–0.7)
Eosinophils Relative: 1.1 % (ref 0.0–5.0)
HCT: 42.5 % (ref 36.0–46.0)
Hemoglobin: 14.2 g/dL (ref 12.0–15.0)
Lymphocytes Relative: 34.6 % (ref 12.0–46.0)
Lymphs Abs: 2.6 10*3/uL (ref 0.7–4.0)
MCHC: 33.3 g/dL (ref 30.0–36.0)
MCV: 89.7 fl (ref 78.0–100.0)
Monocytes Absolute: 0.5 10*3/uL (ref 0.1–1.0)
Monocytes Relative: 7.3 % (ref 3.0–12.0)
Neutro Abs: 4.2 10*3/uL (ref 1.4–7.7)
Neutrophils Relative %: 56.2 % (ref 43.0–77.0)
Platelets: 273 10*3/uL (ref 150.0–400.0)
RBC: 4.74 Mil/uL (ref 3.87–5.11)
RDW: 15.5 % (ref 11.5–15.5)
WBC: 7.5 10*3/uL (ref 4.0–10.5)

## 2016-11-26 LAB — COMPREHENSIVE METABOLIC PANEL
ALT: 15 U/L (ref 0–35)
AST: 16 U/L (ref 0–37)
Albumin: 4.3 g/dL (ref 3.5–5.2)
Alkaline Phosphatase: 54 U/L (ref 39–117)
BUN: 11 mg/dL (ref 6–23)
CO2: 24 mEq/L (ref 19–32)
Calcium: 9.5 mg/dL (ref 8.4–10.5)
Chloride: 103 mEq/L (ref 96–112)
Creatinine, Ser: 0.92 mg/dL (ref 0.40–1.20)
GFR: 66.87 mL/min (ref >60.00)
Glucose, Bld: 71 mg/dL (ref 70–99)
Potassium: 3.4 mEq/L — ABNORMAL LOW (ref 3.5–5.1)
Sodium: 142 mEq/L (ref 135–145)
Total Bilirubin: 0.3 mg/dL (ref 0.2–1.2)
Total Protein: 7.3 g/dL (ref 6.0–8.3)

## 2016-11-26 LAB — LIPID PANEL
Cholesterol: 260 mg/dL — ABNORMAL HIGH (ref 0–200)
HDL: 48.1 mg/dL (ref >39.00)
NonHDL: 212
Total CHOL/HDL Ratio: 5
Triglycerides: 215 mg/dL — ABNORMAL HIGH (ref 0.0–149.0)
VLDL: 43 mg/dL — ABNORMAL HIGH (ref 0.0–40.0)

## 2016-11-26 LAB — TSH: TSH: 4 u[IU]/mL (ref 0.35–4.50)

## 2016-11-26 LAB — VALPROIC ACID LEVEL: Valproic Acid Lvl: 73.7 mg/L (ref 50.0–100.0)

## 2016-11-26 LAB — LDL CHOLESTEROL, DIRECT: Direct LDL: 184 mg/dL

## 2016-12-01 ENCOUNTER — Other Ambulatory Visit: Payer: Self-pay | Admitting: Family Medicine

## 2016-12-01 ENCOUNTER — Telehealth: Payer: Self-pay

## 2016-12-01 DIAGNOSIS — E785 Hyperlipidemia, unspecified: Secondary | ICD-10-CM

## 2016-12-01 NOTE — Telephone Encounter (Signed)
-----   Message from Ann Held, DO sent at 11/30/2016  4:06 PM EDT ----- Cholesterol--- LDL goal < 100,  HDL >40,  TG < 150.  Diet and exercise will increase HDL and decrease LDL and TG.  Fish,  Fish Oil, Flaxseed oil will also help increase the HDL and decrease Triglycerides.   Recheck labs in 3 months Lipid cmp  .

## 2016-12-01 NOTE — Telephone Encounter (Signed)
Called to inform Pt of lab results. Left message for Pt to call back.

## 2016-12-02 NOTE — Telephone Encounter (Signed)
Spoke to pt --- she just wanted to let us know she believes her vitals were falsified  Forwarded to [place]

## 2016-12-02 NOTE — Telephone Encounter (Addendum)
Relation to GE:EATV Call back [phone]   Reason for call:  Patient would like to speak with Dr. Etter Sjogren directly regarding her lab results, please advise

## 2016-12-03 DIAGNOSIS — F3181 Bipolar II disorder: Secondary | ICD-10-CM | POA: Diagnosis not present

## 2016-12-04 MED FILL — QUETIAPINE ER 300 MG TABLET: 300 | 30 days supply | Qty: 60 | Fill #2

## 2016-12-08 MED FILL — PRAMIPEXOLE 0.25 MG TABLET: 0.25 | 90 days supply | Qty: 270 | Fill #1

## 2016-12-12 MED FILL — LINZESS 145 MCG CAPSULE: 145 | 30 days supply | Qty: 30 | Fill #0

## 2016-12-16 MED FILL — DEXILANT DR 60 MG CAPSULE: 60 | 30 days supply | Qty: 30 | Fill #9

## 2016-12-24 ENCOUNTER — Telehealth: Payer: Self-pay | Admitting: Family Medicine

## 2016-12-24 ENCOUNTER — Ambulatory Visit (INDEPENDENT_AMBULATORY_CARE_PROVIDER_SITE_OTHER): Payer: 59 | Admitting: Psychology

## 2016-12-24 DIAGNOSIS — F332 Major depressive disorder, recurrent severe without psychotic features: Secondary | ICD-10-CM

## 2016-12-24 NOTE — Telephone Encounter (Signed)
Pt would like to schedule her shingles vac. Advised pt that I need approval. Is it okay to schedule?    Do we have shingles vac in stock?    Please advise for scheduling?

## 2016-12-25 NOTE — Telephone Encounter (Signed)
We have a few. Per Dr. Etter Sjogren ok to make appt.

## 2016-12-29 NOTE — Telephone Encounter (Signed)
lvm for pt to return call to schedule shingles vac.

## 2016-12-31 DIAGNOSIS — F3181 Bipolar II disorder: Secondary | ICD-10-CM | POA: Diagnosis not present

## 2017-01-05 MED FILL — QUETIAPINE ER 300 MG TABLET: 300 | 90 days supply | Qty: 180 | Fill #0

## 2017-01-05 MED FILL — PRAMIPEXOLE 1 MG TABLET: 1 | 90 days supply | Qty: 180 | Fill #0

## 2017-01-05 MED FILL — BUPROPION HCL SR 200 MG TAB: 200 | 90 days supply | Qty: 90 | Fill #0

## 2017-01-05 MED FILL — LINZESS 145 MCG CAPSULE: 145 | 90 days supply | Qty: 90 | Fill #0

## 2017-01-05 MED FILL — DIVALPROEX SOD ER 500 MG TA: 500 | 90 days supply | Qty: 180 | Fill #0

## 2017-01-07 ENCOUNTER — Ambulatory Visit (INDEPENDENT_AMBULATORY_CARE_PROVIDER_SITE_OTHER): Payer: 59 | Admitting: Psychology

## 2017-01-07 DIAGNOSIS — F332 Major depressive disorder, recurrent severe without psychotic features: Secondary | ICD-10-CM

## 2017-01-16 ENCOUNTER — Ambulatory Visit (INDEPENDENT_AMBULATORY_CARE_PROVIDER_SITE_OTHER): Payer: 59

## 2017-01-16 DIAGNOSIS — Z23 Encounter for immunization: Secondary | ICD-10-CM

## 2017-01-16 MED FILL — DEXILANT DR 60 MG CAPSULE: 60 | 30 days supply | Qty: 30 | Fill #10

## 2017-01-20 ENCOUNTER — Ambulatory Visit: Payer: 59 | Admitting: Psychology

## 2017-01-21 ENCOUNTER — Other Ambulatory Visit (HOSPITAL_BASED_OUTPATIENT_CLINIC_OR_DEPARTMENT_OTHER): Payer: 59

## 2017-01-21 ENCOUNTER — Ambulatory Visit (HOSPITAL_BASED_OUTPATIENT_CLINIC_OR_DEPARTMENT_OTHER): Payer: 59 | Admitting: Hematology & Oncology

## 2017-01-21 ENCOUNTER — Other Ambulatory Visit: Payer: Self-pay

## 2017-01-21 DIAGNOSIS — D6862 Lupus anticoagulant syndrome: Secondary | ICD-10-CM

## 2017-01-21 DIAGNOSIS — Z86711 Personal history of pulmonary embolism: Secondary | ICD-10-CM

## 2017-01-21 DIAGNOSIS — I2782 Chronic pulmonary embolism: Secondary | ICD-10-CM

## 2017-01-21 DIAGNOSIS — I2602 Saddle embolus of pulmonary artery with acute cor pulmonale: Secondary | ICD-10-CM

## 2017-01-21 LAB — CMP (CANCER CENTER ONLY)
ALT(SGPT): 22 U/L (ref 10–47)
AST: 21 U/L (ref 11–38)
Albumin: 3.3 g/dL (ref 3.3–5.5)
Alkaline Phosphatase: 62 U/L (ref 26–84)
BUN, Bld: 9 mg/dL (ref 7–22)
CO2: 29 mEq/L (ref 18–33)
Calcium: 9.2 mg/dL (ref 8.0–10.3)
Chloride: 106 mEq/L (ref 98–108)
Creat: 0.9 mg/dl (ref 0.6–1.2)
Glucose, Bld: 95 mg/dL (ref 73–118)
Potassium: 3.2 mEq/L — ABNORMAL LOW (ref 3.3–4.7)
Sodium: 142 mEq/L (ref 128–145)
Total Bilirubin: 0.6 mg/dl (ref 0.20–1.60)
Total Protein: 6.9 g/dL (ref 6.4–8.1)

## 2017-01-21 LAB — CBC WITH DIFFERENTIAL (CANCER CENTER ONLY)
BASO#: 0 10*3/uL (ref 0.0–0.2)
BASO%: 0.4 % (ref 0.0–2.0)
EOS%: 6.5 % (ref 0.0–7.0)
Eosinophils Absolute: 0.5 10*3/uL (ref 0.0–0.5)
HCT: 38.2 % (ref 34.8–46.6)
HGB: 12.8 g/dL (ref 11.6–15.9)
LYMPH#: 3.6 10*3/uL — ABNORMAL HIGH (ref 0.9–3.3)
LYMPH%: 51.1 % — ABNORMAL HIGH (ref 14.0–48.0)
MCH: 30 pg (ref 26.0–34.0)
MCHC: 33.5 g/dL (ref 32.0–36.0)
MCV: 90 fL (ref 81–101)
MONO#: 0.7 10*3/uL (ref 0.1–0.9)
MONO%: 10.4 % (ref 0.0–13.0)
NEUT#: 2.2 10*3/uL (ref 1.5–6.5)
NEUT%: 31.6 % — ABNORMAL LOW (ref 39.6–80.0)
Platelets: 176 10*3/uL (ref 145–400)
RBC: 4.27 10*6/uL (ref 3.70–5.32)
RDW: 14.5 % (ref 11.1–15.7)
WBC: 7 10*3/uL (ref 3.9–10.0)

## 2017-01-21 NOTE — Progress Notes (Signed)
Hematology and Oncology Follow Up Visit  Beverly Cole [id number] Sep 07, 1960 56 y.o. 01/21/2017   Principle Diagnosis:   Bilateral pulmonary embolism  Positive lupus anticoagulant  Mildly depressed protein S level  Current Therapy:    Xarelto 20 mg by mouth daily-finish 1 year of therapy in December 2017  Xarelto 10 mg by mouth daily-to complete 1 year in December 2018 - d/c on 01/21/2017  EC ASA 325 mg po q day - start on 01/21/2017     Interim History:  Beverly Cole is back for follow-up.  She is doing pretty well.  She really has no specific complaints since we last saw her.  She has had no problems with bleeding.  She has had no issues with headache.  She has had no cough or shortness of breath.  We will stop the Xarelto now.  We will get her on full dose aspirin.  We last checked her Protein S level which was last year, her level was 141%.  She does have the positive lupus anticoagulant.  She has had no leg swelling.  She has had no leg cramps.  She has had no nausea or vomiting.  There is been no change in bowel or bladder habits.  She and her husband a very nice Thanksgiving.  They plan on staying local for Christmas.  Currently, her performance status is ECOG 0.  Medications:  Current Outpatient Medications:  .  ALPRAZolam (XANAX) 0.5 MG tablet, Take 0.25-0.5 mg by mouth at bedtime as needed for sleep., Disp: , Rfl:  .  aspirin EC 81 MG tablet, Take 81 mg by mouth daily., Disp: , Rfl:  .  BIOTIN PO, Take 1 tablet by mouth daily., Disp: , Rfl:  .  CARTIA XT 180 MG 24 hr capsule, TAKE 1 CAPSULE BY MOUTH DAILY., Disp: 90 capsule, Rfl: 1 .  Cholecalciferol (VITAMIN D3) 1000 UNITS CAPS, Take 1,000 Units by mouth daily. , Disp: , Rfl:  .  dexlansoprazole (DEXILANT) 60 MG capsule, Take 60 mg by mouth daily., Disp: , Rfl:  .  Divalproex Sodium (DEPAKOTE PO), Take 1,000 mg by mouth at bedtime., Disp: , Rfl:  .  FINACEA 15 % FOAM, APPLY TOPICALLY DAILY AS NEEDED, Disp: 50 g,  Rfl: 3 .  fluticasone (FLONASE) 50 MCG/ACT nasal spray, Place 2 sprays into the nose daily as needed. For allergies, Disp: , Rfl:  .  ketoconazole (NIZORAL) 2 % cream, Apply 1 application topically daily., Disp: 60 g, Rfl: 3 .  lidocaine (XYLOCAINE) 2 % jelly, Apply topically as needed (for dysparunia)., Disp: 30 mL, Rfl: 12 .  Linaclotide (LINZESS PO), Take by mouth daily., Disp: , Rfl:  .  nitrofurantoin, macrocrystal-monohydrate, (MACROBID) 100 MG capsule, Take 1 capsule (100 mg total) by mouth once as needed (post coital)., Disp: 30 capsule, Rfl: 8 .  pramipexole (MIRAPEX) 1 MG tablet, Take 1 mg by mouth every evening. , Disp: , Rfl: 0 .  Probiotic Product (PROBIOTIC DAILY PO), Take 1 tablet by mouth daily. , Disp: , Rfl:  .  propranolol (INDERAL) 10 MG tablet, Take 20 mg by mouth daily as needed (shakiness). Reported on 05/17/2015, Disp: , Rfl: 1 .  QUEtiapine Fumarate (SEROQUEL PO), 600 mg daily. , Disp: , Rfl:  .  rivaroxaban (XARELTO) 10 MG TABS tablet, Take 1 tablet (10 mg total) by mouth daily., Disp: 90 tablet, Rfl: 2 .  tretinoin (RETIN-A) 0.05 % cream, Apply topically at bedtime., Disp: 45 g, Rfl: 3  Allergies:  Allergies  Allergen Reactions  . Cariprazine Other (See Comments)    Patient becomes suicidal when taking this medication.   . Lopressor [Metoprolol] Other (See Comments)    Hallucinations hair falls out  . Nickel Rash    Past Medical History, Surgical history, Social history, and Family History were reviewed and updated.  Review of Systems: As stated in the interim history  Physical Exam:  vitals were not taken for this visit.   Wt Readings from Last 3 Encounters:  11/25/16 169 lb (76.7 kg)  07/16/16 164 lb (74.4 kg)  06/17/16 171 lb (77.6 kg)     Physical Exam  Constitutional: She is oriented to person, place, and time.  HENT:  Head: Normocephalic and atraumatic.  Mouth/Throat: Oropharynx is clear and moist.  Eyes: EOM are normal. Pupils are equal,  round, and reactive to light.  Neck: Normal range of motion.  Cardiovascular: Normal rate, regular rhythm and normal heart sounds.  Pulmonary/Chest: Effort normal and breath sounds normal.  Abdominal: Soft. Bowel sounds are normal.  Musculoskeletal: Normal range of motion. She exhibits no edema, tenderness or deformity.  Lymphadenopathy:    She has no cervical adenopathy.  Neurological: She is alert and oriented to person, place, and time.  Skin: Skin is warm and dry. No rash noted. No erythema.  Psychiatric: She has a normal mood and affect. Her behavior is normal. Judgment and thought content normal.  Vitals reviewed.  .  Lab Results  Component Value Date   WBC 7.0 01/21/2017   HGB 12.8 01/21/2017   HCT 38.2 01/21/2017   MCV 90 01/21/2017   PLT 176 01/21/2017     Chemistry      Component Value Date/Time   NA 142 01/21/2017 1013   NA 143 01/16/2016 1005   K 3.2 (L) 01/21/2017 1013   K 3.3 (L) 01/16/2016 1005   CL 106 01/21/2017 1013   CO2 29 01/21/2017 1013   CO2 20 (L) 01/16/2016 1005   BUN 9 01/21/2017 1013   BUN 12.6 01/16/2016 1005   CREATININE 0.9 01/21/2017 1013   CREATININE 0.9 01/16/2016 1005      Component Value Date/Time   CALCIUM 9.2 01/21/2017 1013   CALCIUM 9.5 01/16/2016 1005   ALKPHOS 62 01/21/2017 1013   ALKPHOS 81 01/16/2016 1005   AST 21 01/21/2017 1013   AST 16 01/16/2016 1005   ALT 22 01/21/2017 1013   ALT 24 01/16/2016 1005   BILITOT 0.60 01/21/2017 1013   BILITOT 0.59 01/16/2016 1005         Impression and Plan: Beverly Cole is a 56 year old white female. She had a pulmonary embolism. This was a bilateral pulmonary embolism. She had been on hormonal replacement therapy.  Again, we will get her off Xarelto.  I will have her take full dose aspirin given that she has a positive lupus anticoagulant.  I would like to see her back in 4 months.  If all looks good in 4 months, then I think we can let her go from the clinic as we really would not  be adding much to her medical care.    Volanda Napoleon, MD 12/12/201811:27 AM

## 2017-01-29 MED FILL — ALPRAZolam 0.25 MG TABS: 0.25 | 15 days supply | Qty: 60 | Fill #1

## 2017-02-16 MED FILL — DEXILANT DR 60 MG CAPSULE: 60 | 30 days supply | Qty: 30 | Fill #0

## 2017-02-25 MED FILL — CARTIA XT 180 MG CAPSULE SA: 180 | 90 days supply | Qty: 90 | Fill #1

## 2017-03-11 DIAGNOSIS — F3181 Bipolar II disorder: Secondary | ICD-10-CM | POA: Diagnosis not present

## 2017-03-12 MED FILL — LINZESS 72 MCG CAPSULE: 72 | 30 days supply | Qty: 30 | Fill #0

## 2017-03-12 MED FILL — LITHIUM CARBONATE 150 MG CA: 150 | 90 days supply | Qty: 90 | Fill #0

## 2017-03-15 ENCOUNTER — Encounter: Payer: Self-pay | Admitting: Family Medicine

## 2017-03-16 ENCOUNTER — Emergency Department (HOSPITAL_BASED_OUTPATIENT_CLINIC_OR_DEPARTMENT_OTHER): Payer: 59

## 2017-03-16 ENCOUNTER — Inpatient Hospital Stay (HOSPITAL_BASED_OUTPATIENT_CLINIC_OR_DEPARTMENT_OTHER)
Admission: EM | Admit: 2017-03-16 | Discharge: 2017-03-19 | DRG: 339 | Disposition: A | Discharge status: Home / Self Care | Payer: 59 | Attending: Surgery | Admitting: Surgery

## 2017-03-16 ENCOUNTER — Other Ambulatory Visit: Payer: Self-pay

## 2017-03-16 ENCOUNTER — Encounter (HOSPITAL_BASED_OUTPATIENT_CLINIC_OR_DEPARTMENT_OTHER): Payer: Self-pay

## 2017-03-16 DIAGNOSIS — K581 Irritable bowel syndrome with constipation: Secondary | ICD-10-CM | POA: Diagnosis present

## 2017-03-16 DIAGNOSIS — I4891 Unspecified atrial fibrillation: Secondary | ICD-10-CM | POA: Diagnosis present

## 2017-03-16 DIAGNOSIS — R109 Unspecified abdominal pain: Secondary | ICD-10-CM | POA: Diagnosis not present

## 2017-03-16 DIAGNOSIS — R760 Raised antibody titer: Secondary | ICD-10-CM

## 2017-03-16 DIAGNOSIS — K3532 Acute appendicitis with perforation and localized peritonitis, without abscess: Secondary | ICD-10-CM | POA: Diagnosis not present

## 2017-03-16 DIAGNOSIS — F32A Depression, unspecified: Secondary | ICD-10-CM

## 2017-03-16 DIAGNOSIS — D6862 Lupus anticoagulant syndrome: Secondary | ICD-10-CM | POA: Diagnosis present

## 2017-03-16 DIAGNOSIS — E785 Hyperlipidemia, unspecified: Secondary | ICD-10-CM | POA: Diagnosis not present

## 2017-03-16 DIAGNOSIS — F3181 Bipolar II disorder: Secondary | ICD-10-CM | POA: Diagnosis present

## 2017-03-16 DIAGNOSIS — Z888 Allergy status to other drugs, medicaments and biological substances status: Secondary | ICD-10-CM | POA: Diagnosis not present

## 2017-03-16 DIAGNOSIS — Z8679 Personal history of other diseases of the circulatory system: Secondary | ICD-10-CM | POA: Diagnosis not present

## 2017-03-16 DIAGNOSIS — E039 Hypothyroidism, unspecified: Secondary | ICD-10-CM | POA: Diagnosis present

## 2017-03-16 DIAGNOSIS — Z780 Asymptomatic menopausal state: Secondary | ICD-10-CM

## 2017-03-16 DIAGNOSIS — G709 Myoneural disorder, unspecified: Secondary | ICD-10-CM | POA: Diagnosis present

## 2017-03-16 DIAGNOSIS — J439 Emphysema, unspecified: Secondary | ICD-10-CM | POA: Diagnosis present

## 2017-03-16 DIAGNOSIS — R110 Nausea: Secondary | ICD-10-CM | POA: Diagnosis not present

## 2017-03-16 DIAGNOSIS — Z87891 Personal history of nicotine dependence: Secondary | ICD-10-CM | POA: Diagnosis not present

## 2017-03-16 DIAGNOSIS — Z91048 Other nonmedicinal substance allergy status: Secondary | ICD-10-CM

## 2017-03-16 DIAGNOSIS — K3533 Acute appendicitis with perforation and localized peritonitis, with abscess: Secondary | ICD-10-CM | POA: Diagnosis not present

## 2017-03-16 DIAGNOSIS — I10 Essential (primary) hypertension: Secondary | ICD-10-CM | POA: Diagnosis not present

## 2017-03-16 DIAGNOSIS — K5641 Fecal impaction: Secondary | ICD-10-CM | POA: Diagnosis present

## 2017-03-16 DIAGNOSIS — K3580 Unspecified acute appendicitis: Secondary | ICD-10-CM | POA: Diagnosis not present

## 2017-03-16 DIAGNOSIS — F419 Anxiety disorder, unspecified: Secondary | ICD-10-CM | POA: Diagnosis present

## 2017-03-16 DIAGNOSIS — R1031 Right lower quadrant pain: Secondary | ICD-10-CM | POA: Diagnosis not present

## 2017-03-16 DIAGNOSIS — Z7982 Long term (current) use of aspirin: Secondary | ICD-10-CM | POA: Diagnosis not present

## 2017-03-16 DIAGNOSIS — I341 Nonrheumatic mitral (valve) prolapse: Secondary | ICD-10-CM | POA: Diagnosis present

## 2017-03-16 DIAGNOSIS — F329 Major depressive disorder, single episode, unspecified: Secondary | ICD-10-CM

## 2017-03-16 DIAGNOSIS — K219 Gastro-esophageal reflux disease without esophagitis: Secondary | ICD-10-CM | POA: Diagnosis not present

## 2017-03-16 DIAGNOSIS — Z7901 Long term (current) use of anticoagulants: Secondary | ICD-10-CM

## 2017-03-16 DIAGNOSIS — Z79899 Other long term (current) drug therapy: Secondary | ICD-10-CM | POA: Diagnosis not present

## 2017-03-16 DIAGNOSIS — Z86711 Personal history of pulmonary embolism: Secondary | ICD-10-CM

## 2017-03-16 DIAGNOSIS — R140 Abdominal distension (gaseous): Secondary | ICD-10-CM | POA: Diagnosis not present

## 2017-03-16 DIAGNOSIS — K589 Irritable bowel syndrome without diarrhea: Secondary | ICD-10-CM

## 2017-03-16 HISTORY — DX: Other pulmonary embolism without acute cor pulmonale: I26.99

## 2017-03-16 LAB — HEPATIC FUNCTION PANEL
ALT: 19 U/L (ref 14–54)
AST: 23 U/L (ref 15–41)
Albumin: 3.6 g/dL (ref 3.5–5.0)
Alkaline Phosphatase: 65 U/L (ref 38–126)
Bilirubin, Direct: 0.1 mg/dL (ref 0.1–0.5)
Indirect Bilirubin: 0.5 mg/dL (ref 0.3–0.9)
Total Bilirubin: 0.6 mg/dL (ref 0.3–1.2)
Total Protein: 7.5 g/dL (ref 6.5–8.1)

## 2017-03-16 LAB — I-STAT CHEM 8, ED
BUN: 10 mg/dL (ref 6–20)
Calcium, Ion: 1.15 mmol/L (ref 1.15–1.40)
Chloride: 101 mmol/L (ref 101–111)
Creatinine, Ser: 1 mg/dL (ref 0.44–1.00)
Glucose, Bld: 104 mg/dL — ABNORMAL HIGH (ref 65–99)
HCT: 38 % (ref 36.0–46.0)
Hemoglobin: 12.9 g/dL (ref 12.0–15.0)
Potassium: 3.8 mmol/L (ref 3.5–5.1)
Sodium: 141 mmol/L (ref 135–145)
TCO2: 28 mmol/L (ref 22–32)

## 2017-03-16 LAB — URINALYSIS, ROUTINE W REFLEX MICROSCOPIC
Bilirubin Urine: NEGATIVE
Glucose, UA: NEGATIVE mg/dL
Hgb urine dipstick: NEGATIVE
Ketones, ur: NEGATIVE mg/dL
Nitrite: NEGATIVE
Protein, ur: NEGATIVE mg/dL
Specific Gravity, Urine: <1.005 — ABNORMAL LOW (ref 1.005–1.030)
pH: 7 (ref 5.0–8.0)

## 2017-03-16 LAB — CBC WITH DIFFERENTIAL/PLATELET
Basophils Absolute: 0 10*3/uL (ref 0.0–0.1)
Basophils Relative: 0 %
Eosinophils Absolute: 0 10*3/uL (ref 0.0–0.7)
Eosinophils Relative: 0 %
HCT: 39.4 % (ref 36.0–46.0)
Hemoglobin: 13 g/dL (ref 12.0–15.0)
Lymphocytes Relative: 13 %
Lymphs Abs: 2.2 10*3/uL (ref 0.7–4.0)
MCH: 29.3 pg (ref 26.0–34.0)
MCHC: 33 g/dL (ref 30.0–36.0)
MCV: 88.7 fL (ref 78.0–100.0)
Monocytes Absolute: 2 10*3/uL — ABNORMAL HIGH (ref 0.1–1.0)
Monocytes Relative: 12 %
Neutro Abs: 12.8 10*3/uL — ABNORMAL HIGH (ref 1.7–7.7)
Neutrophils Relative %: 75 %
Platelets: 269 10*3/uL (ref 150–400)
RBC: 4.44 MIL/uL (ref 3.87–5.11)
RDW: 14.3 % (ref 11.5–15.5)
WBC: 17 10*3/uL — ABNORMAL HIGH (ref 4.0–10.5)

## 2017-03-16 LAB — URINALYSIS, MICROSCOPIC (REFLEX)

## 2017-03-16 LAB — LIPASE, BLOOD: Lipase: 23 U/L (ref 11–51)

## 2017-03-16 MED ORDER — ALPRAZOLAM 0.25 MG PO TABS: 0.2500 mg | ORAL_TABLET | Freq: Every evening | ORAL | Status: DC | PRN

## 2017-03-16 MED ORDER — ONDANSETRON HCL 4 MG/2ML IJ SOLN
4.0000 mg | Freq: Once | INTRAMUSCULAR | Status: AC
  Administered 2017-03-16: 4 mg via INTRAVENOUS

## 2017-03-16 MED ORDER — ONDANSETRON HCL 4 MG/2ML IJ SOLN
4.0000 mg | Freq: Four times a day (QID) | INTRAMUSCULAR | Status: DC | PRN
  Administered 2017-03-16: 4 mg via INTRAVENOUS
  Filled 2017-03-16: qty 2

## 2017-03-16 MED ORDER — PANTOPRAZOLE SODIUM 40 MG PO TBEC
40.0000 mg | DELAYED_RELEASE_TABLET | Freq: Two times a day (BID) | ORAL | Status: DC
  Administered 2017-03-16 – 2017-03-19 (×5): 40 mg via ORAL
  Filled 2017-03-16 (×5): qty 1

## 2017-03-16 MED ORDER — SACCHAROMYCES BOULARDII 250 MG PO CAPS
250.0000 mg | ORAL_CAPSULE | Freq: Two times a day (BID) | ORAL | Status: DC
  Administered 2017-03-16 – 2017-03-18 (×5): 250 mg via ORAL
  Filled 2017-03-16 (×6): qty 1

## 2017-03-16 MED ORDER — SIMETHICONE 80 MG PO CHEW
40.0000 mg | CHEWABLE_TABLET | Freq: Four times a day (QID) | ORAL | Status: DC | PRN
  Filled 2017-03-16: qty 1

## 2017-03-16 MED ORDER — GABAPENTIN 300 MG PO CAPS
300.0000 mg | ORAL_CAPSULE | Freq: Every day | ORAL | Status: AC
  Administered 2017-03-16 – 2017-03-18 (×3): 300 mg via ORAL
  Filled 2017-03-16 (×4): qty 1

## 2017-03-16 MED ORDER — PHENOL 1.4 % MT LIQD: 1.0000 | OROMUCOSAL | Status: DC | PRN

## 2017-03-16 MED ORDER — LIP MEDEX EX OINT
1.0000 "application " | TOPICAL_OINTMENT | Freq: Two times a day (BID) | CUTANEOUS | Status: DC
  Administered 2017-03-17 – 2017-03-18 (×5): 1 via TOPICAL
  Filled 2017-03-16 (×2): qty 7

## 2017-03-16 MED ORDER — METRONIDAZOLE IN NACL 5-0.79 MG/ML-% IV SOLN
500.0000 mg | Freq: Three times a day (TID) | INTRAVENOUS | Status: DC
  Administered 2017-03-17 (×2): 500 mg via INTRAVENOUS
  Filled 2017-03-16 (×5): qty 100

## 2017-03-16 MED ORDER — ONDANSETRON 4 MG PO TBDP: 4.0000 mg | ORAL_TABLET | Freq: Four times a day (QID) | ORAL | Status: DC | PRN

## 2017-03-16 MED ORDER — PROCHLORPERAZINE EDISYLATE 5 MG/ML IJ SOLN: 5.0000 mg | INTRAMUSCULAR | Status: DC | PRN

## 2017-03-16 MED ORDER — CHLORHEXIDINE GLUCONATE CLOTH 2 % EX PADS
6.0000 | MEDICATED_PAD | Freq: Once | CUTANEOUS | Status: AC
  Administered 2017-03-17: 6 via TOPICAL

## 2017-03-16 MED ORDER — PROPRANOLOL HCL 20 MG PO TABS
20.0000 mg | ORAL_TABLET | Freq: Every day | ORAL | Status: DC | PRN
  Filled 2017-03-16: qty 1

## 2017-03-16 MED ORDER — DEXTROSE 5 % IV SOLN
2.0000 g | Freq: Once | INTRAVENOUS | Status: AC
  Administered 2017-03-16: 2 g via INTRAVENOUS
  Filled 2017-03-16: qty 2

## 2017-03-16 MED ORDER — METHOCARBAMOL 1000 MG/10ML IJ SOLN
1000.0000 mg | Freq: Four times a day (QID) | INTRAVENOUS | Status: DC | PRN
  Filled 2017-03-16: qty 10

## 2017-03-16 MED ORDER — ACETAMINOPHEN 500 MG PO TABS
1000.0000 mg | ORAL_TABLET | Freq: Three times a day (TID) | ORAL | Status: DC
  Administered 2017-03-16 – 2017-03-18 (×6): 1000 mg via ORAL
  Filled 2017-03-16 (×7): qty 2

## 2017-03-16 MED ORDER — QUETIAPINE FUMARATE ER 300 MG PO TB24
600.0000 mg | ORAL_TABLET | Freq: Every day | ORAL | Status: DC
  Administered 2017-03-17: 600 mg via ORAL
  Filled 2017-03-16 (×3): qty 2

## 2017-03-16 MED ORDER — FENTANYL CITRATE (PF) 100 MCG/2ML IJ SOLN
50.0000 ug | INTRAMUSCULAR | Status: DC | PRN
  Administered 2017-03-16: 50 ug via INTRAVENOUS
  Filled 2017-03-16: qty 2

## 2017-03-16 MED ORDER — ENOXAPARIN SODIUM 40 MG/0.4ML ~~LOC~~ SOLN
40.0000 mg | SUBCUTANEOUS | Status: DC
  Administered 2017-03-16 – 2017-03-18 (×3): 40 mg via SUBCUTANEOUS
  Filled 2017-03-16 (×3): qty 0.4

## 2017-03-16 MED ORDER — DEXTROSE 5 % IV SOLN: 2.0000 g | INTRAVENOUS | Status: DC

## 2017-03-16 MED ORDER — DEXTROSE 5 % IV SOLN
2.0000 g | INTRAVENOUS | Status: DC
  Administered 2017-03-17: 2 g via INTRAVENOUS
  Filled 2017-03-16: qty 2

## 2017-03-16 MED ORDER — MAGIC MOUTHWASH
15.0000 mL | Freq: Four times a day (QID) | ORAL | Status: DC | PRN
  Filled 2017-03-16: qty 15

## 2017-03-16 MED ORDER — IOPAMIDOL (ISOVUE-300) INJECTION 61%
100.0000 mL | Freq: Once | INTRAVENOUS | Status: AC | PRN
  Administered 2017-03-16: 100 mL via INTRAVENOUS

## 2017-03-16 MED ORDER — DIPHENHYDRAMINE HCL 12.5 MG/5ML PO ELIX: 12.5000 mg | ORAL_SOLUTION | Freq: Four times a day (QID) | ORAL | Status: DC | PRN

## 2017-03-16 MED ORDER — ALUM & MAG HYDROXIDE-SIMETH 200-200-20 MG/5ML PO SUSP: 30.0000 mL | Freq: Four times a day (QID) | ORAL | Status: DC | PRN

## 2017-03-16 MED ORDER — LINACLOTIDE 145 MCG PO CAPS: 145.0000 ug | ORAL_CAPSULE | Freq: Every day | ORAL | Status: DC

## 2017-03-16 MED ORDER — CHLORHEXIDINE GLUCONATE CLOTH 2 % EX PADS: 6.0000 | MEDICATED_PAD | Freq: Once | CUTANEOUS | Status: DC

## 2017-03-16 MED ORDER — HYDROCORTISONE 2.5 % RE CREA: 1.0000 "application " | TOPICAL_CREAM | Freq: Four times a day (QID) | RECTAL | Status: DC | PRN

## 2017-03-16 MED ORDER — HYDROMORPHONE HCL 1 MG/ML IJ SOLN
0.5000 mg | INTRAMUSCULAR | Status: DC | PRN
  Administered 2017-03-16 – 2017-03-17 (×8): 1 mg via INTRAVENOUS
  Administered 2017-03-19 (×2): 2 mg via INTRAVENOUS
  Filled 2017-03-16: qty 1
  Filled 2017-03-16: qty 2
  Filled 2017-03-16: qty 1
  Filled 2017-03-16: qty 2
  Filled 2017-03-16 (×6): qty 1

## 2017-03-16 MED ORDER — FLUTICASONE PROPIONATE 50 MCG/ACT NA SUSP: 2.0000 | Freq: Every day | NASAL | Status: DC | PRN

## 2017-03-16 MED ORDER — ACETAMINOPHEN 650 MG RE SUPP: 650.0000 mg | Freq: Four times a day (QID) | RECTAL | Status: DC | PRN

## 2017-03-16 MED ORDER — ACETAMINOPHEN 500 MG PO TABS
1000.0000 mg | ORAL_TABLET | ORAL | Status: AC
  Administered 2017-03-17: 1000 mg via ORAL
  Filled 2017-03-16: qty 2

## 2017-03-16 MED ORDER — SODIUM CHLORIDE 0.9 % IV SOLN
8.0000 mg | Freq: Four times a day (QID) | INTRAVENOUS | Status: DC | PRN
  Filled 2017-03-16: qty 4

## 2017-03-16 MED ORDER — ONDANSETRON HCL 4 MG/2ML IJ SOLN
4.0000 mg | Freq: Once | INTRAMUSCULAR | Status: AC
  Administered 2017-03-16: 4 mg via INTRAVENOUS
  Filled 2017-03-16: qty 2

## 2017-03-16 MED ORDER — LORAZEPAM 2 MG/ML IJ SOLN
0.5000 mg | Freq: Three times a day (TID) | INTRAMUSCULAR | Status: DC | PRN
  Administered 2017-03-17 – 2017-03-19 (×3): 1 mg via INTRAVENOUS
  Filled 2017-03-16 (×3): qty 1

## 2017-03-16 MED ORDER — METRONIDAZOLE IN NACL 5-0.79 MG/ML-% IV SOLN: 500.0000 mg | Freq: Three times a day (TID) | INTRAVENOUS | Status: DC

## 2017-03-16 MED ORDER — METOCLOPRAMIDE HCL 5 MG/ML IJ SOLN: 10.0000 mg | Freq: Four times a day (QID) | INTRAMUSCULAR | Status: DC | PRN

## 2017-03-16 MED ORDER — ASPIRIN EC 81 MG PO TBEC: 81.0000 mg | DELAYED_RELEASE_TABLET | Freq: Every day | ORAL | Status: DC

## 2017-03-16 MED ORDER — GABAPENTIN 300 MG PO CAPS
300.0000 mg | ORAL_CAPSULE | ORAL | Status: AC
  Administered 2017-03-17: 300 mg via ORAL
  Filled 2017-03-16 (×2): qty 1

## 2017-03-16 MED ORDER — ONDANSETRON HCL 4 MG/2ML IJ SOLN
4.0000 mg | Freq: Four times a day (QID) | INTRAMUSCULAR | Status: DC | PRN
  Administered 2017-03-17: 4 mg via INTRAVENOUS
  Filled 2017-03-16: qty 2

## 2017-03-16 MED ORDER — DIVALPROEX SODIUM 500 MG PO DR TAB: 1000.0000 mg | DELAYED_RELEASE_TABLET | Freq: Every day | ORAL | Status: DC

## 2017-03-16 MED ORDER — LACTATED RINGERS IV BOLUS (SEPSIS): 1000.0000 mL | Freq: Three times a day (TID) | INTRAVENOUS | Status: AC | PRN

## 2017-03-16 MED ORDER — DIPHENHYDRAMINE HCL 50 MG/ML IJ SOLN: 12.5000 mg | Freq: Four times a day (QID) | INTRAMUSCULAR | Status: DC | PRN

## 2017-03-16 MED ORDER — MORPHINE SULFATE (PF) 4 MG/ML IV SOLN
4.0000 mg | Freq: Once | INTRAVENOUS | Status: AC
  Administered 2017-03-16: 4 mg via INTRAVENOUS
  Filled 2017-03-16: qty 1

## 2017-03-16 MED ORDER — LACTATED RINGERS IV BOLUS (SEPSIS): 1000.0000 mL | Freq: Once | INTRAVENOUS | Status: DC

## 2017-03-16 MED ORDER — ACETAMINOPHEN 325 MG PO TABS: 650.0000 mg | ORAL_TABLET | Freq: Four times a day (QID) | ORAL | Status: DC | PRN

## 2017-03-16 MED ORDER — SODIUM CHLORIDE 0.9 % IV BOLUS (SEPSIS)
1000.0000 mL | Freq: Once | INTRAVENOUS | Status: AC
  Administered 2017-03-16: 1000 mL via INTRAVENOUS

## 2017-03-16 MED ORDER — METRONIDAZOLE IN NACL 5-0.79 MG/ML-% IV SOLN
500.0000 mg | Freq: Once | INTRAVENOUS | Status: AC
  Administered 2017-03-16: 500 mg via INTRAVENOUS
  Filled 2017-03-16: qty 100

## 2017-03-16 MED ORDER — ONDANSETRON HCL 4 MG/2ML IJ SOLN
INTRAMUSCULAR | Status: AC
  Filled 2017-03-16: qty 2

## 2017-03-16 MED ORDER — GUAIFENESIN-DM 100-10 MG/5ML PO SYRP
10.0000 mL | ORAL_SOLUTION | ORAL | Status: DC | PRN
  Administered 2017-03-18 – 2017-03-19 (×2): 10 mL via ORAL
  Filled 2017-03-16 (×2): qty 10

## 2017-03-16 MED ORDER — HALOPERIDOL LACTATE 5 MG/ML IJ SOLN: 2.0000 mg | Freq: Four times a day (QID) | INTRAMUSCULAR | Status: DC | PRN

## 2017-03-16 MED ORDER — MENTHOL 3 MG MT LOZG: 1.0000 | LOZENGE | OROMUCOSAL | Status: DC | PRN

## 2017-03-16 MED ORDER — CELECOXIB 200 MG PO CAPS
200.0000 mg | ORAL_CAPSULE | ORAL | Status: AC
  Administered 2017-03-17: 200 mg via ORAL
  Filled 2017-03-16 (×3): qty 1

## 2017-03-16 MED ORDER — HYDROCORTISONE 1 % EX CREA: 1.0000 "application " | TOPICAL_CREAM | Freq: Three times a day (TID) | CUTANEOUS | Status: DC | PRN

## 2017-03-16 MED ORDER — LACTATED RINGERS IV SOLN
INTRAVENOUS | Status: DC
  Administered 2017-03-17 – 2017-03-18 (×3): via INTRAVENOUS

## 2017-03-16 NOTE — ED Notes (Signed)
Patient transported to CT 

## 2017-03-16 NOTE — ED Triage Notes (Signed)
C/o abd pain x 2-3 days-nausea-denies v/d-NAD-steady gait

## 2017-03-16 NOTE — ED Notes (Signed)
ED TO INPATIENT HANDOFF REPORT  Name/Age/Gender Beverly Cole 57 y.o. female  Code Status    Code Status Orders  (From admission, onward)        Start     Ordered   03/16/17 1807  Full code  Continuous     03/16/17 1809    Code Status History    Date Active Date Inactive Code Status Order ID Comments User Context   02/09/2015 12:35 02/10/2015 17:24 DNR [id number]  Oswald Hillock, MD Inpatient   08/11/2011 14:34 08/12/2011 13:41 Full Code [id number]  Ezequiel Essex, MD ED    Advance Directive Documentation     Most Recent Value  Type of Advance Directive  Healthcare Power of [place], Living will  Pre-existing out of facility DNR order (yellow form or pink MOST form)  No data  "MOST" Form in Place?  No data      Home/SNF/Other Home  Chief Complaint abdominal pain  Level of Care/Admitting Diagnosis ED Disposition    ED Disposition Condition Comment   Admit  Hospital Area: [facility] [[id number]]  Level of Care: Med-Surg [16]  Diagnosis: Acute appendicitis [151761]  Admitting Physician: [place], [place]  Attending Physician: CCS, MD [[id number]]  Estimated length of stay: past midnight tomorrow  Certification:: I certify this patient will need inpatient services for at least 2 midnights  PT Class (Do Not Modify): Inpatient [101]  PT Acc Code (Do Not Modify): Private [1]       Medical History Past Medical History:  Diagnosis Date  . Anxiety   . Atrial fibrillation ([place])    a. Event monitor 2013 - PACs/bradycardia/SVT/short run of atrial fib by event monitor.   Marland Kitchen BIPOLAR DISORDER UNSPECIFIED   . Bradycardia   . Bursitis of hip 09/1999   Bilateral - Dr. Berenice Primas  . DEPRESSION   . Emphysema lung ([place]) 06/20/2016   pt states pulmonalongist stated started recently  . GERD   . Headache(784.0)    was with menstrual cycle. no longer a problem  . HYPERLIPIDEMIA   . Hypertension   . Hypertension   . IRRITABLE BOWEL SYNDROME, HX OF   . Menopausal state  01/2012   FSH = 88.5  . Mitral valve prolapse 12/17/2000   a. dx 1980s, most recent echo did not demonstrate this.  Marland Kitchen MYALGIA   . Normal coronary arteries    a. by cardiac CT 2013.  Marland Kitchen PAT (paroxysmal atrial tachycardia) ([place])   . PE (pulmonary embolism) 02/09/2015   a. Bilateral PEs 01/2015 when d-dimer 0.69, CP lying on left side. Followed by heme-onc - + lupus anticoagulant, mildly depressed protein S. Dr. Marin Olp is not certain if her hypercoagulable studies are significant for a thrombophilic state.  . Premature atrial contractions   . Pulmonary embolus ([place]) 02/10/2015  . Shortness of breath    Pulmonary eval 05/2002  . Thyroid nodule 2014  . Tremors of nervous system     Allergies Allergies  Allergen Reactions  . Cariprazine Other (See Comments)    Patient becomes suicidal when taking this medication.   . Lopressor [Metoprolol] Other (See Comments)    Hallucinations hair falls out  . Nickel Rash    IV Location/Drains/Wounds Patient Lines/Drains/Airways Status   Active Line/Drains/Airways    Name:   Placement date:   Placement time:   Site:   Days:   Peripheral IV 03/16/17 Right Antecubital   03/16/17    1256    Antecubital   less than 1  Labs/Imaging Results for orders placed or performed during the hospital encounter of 03/16/17 (from the past 48 hour(s))  Urinalysis, Routine w reflex microscopic     Status: Abnormal   Collection Time: 03/16/17 12:35 PM  Result Value Ref Range   Color, Urine YELLOW YELLOW   APPearance CLEAR CLEAR   Specific Gravity, Urine <1.005 (L) 1.005 - 1.030   pH 7.0 5.0 - 8.0   Glucose, UA NEGATIVE NEGATIVE mg/dL   Hgb urine dipstick NEGATIVE NEGATIVE   Bilirubin Urine NEGATIVE NEGATIVE   Ketones, ur NEGATIVE NEGATIVE mg/dL   Protein, ur NEGATIVE NEGATIVE mg/dL   Nitrite NEGATIVE NEGATIVE   Leukocytes, UA MODERATE (A) NEGATIVE    Comment: Performed at [facility], [address] [place], [place] [address]   Urinalysis, Microscopic (reflex)     Status: Abnormal   Collection Time: 03/16/17 12:35 PM  Result Value Ref Range   RBC / HPF 0-5 0 - 5 RBC/hpf   WBC, UA 6-30 0 - 5 WBC/hpf   Bacteria, UA RARE (A) NONE SEEN   Squamous Epithelial / LPF 0-5 (A) NONE SEEN   Mucus PRESENT     Comment: Performed at [facility], [address] [place], [place] [address]  Hepatic function panel     Status: None   Collection Time: 03/16/17 12:47 PM  Result Value Ref Range   Total Protein 7.5 6.5 - 8.1 g/dL   Albumin 3.6 3.5 - 5.0 g/dL   AST 23 15 - 41 U/L   ALT 19 14 - 54 U/L   Alkaline Phosphatase 65 38 - 126 U/L   Total Bilirubin 0.6 0.3 - 1.2 mg/dL   Bilirubin, Direct 0.1 0.1 - 0.5 mg/dL   Indirect Bilirubin 0.5 0.3 - 0.9 mg/dL    Comment: Performed at [facility], [address] [place], [place] [address]  Lipase, blood     Status: None   Collection Time: 03/16/17 12:47 PM  Result Value Ref Range   Lipase 23 11 - 51 U/L    Comment: Performed at [facility], [address] [place], [place] [address]  CBC with Differential     Status: Abnormal   Collection Time: 03/16/17 12:47 PM  Result Value Ref Range   WBC 17.0 (H) 4.0 - 10.5 K/uL   RBC 4.44 3.87 - 5.11 MIL/uL   Hemoglobin 13.0 12.0 - 15.0 g/dL   HCT 39.4 36.0 - 46.0 %   MCV 88.7 78.0 - 100.0 fL   MCH 29.3 26.0 - 34.0 pg   MCHC 33.0 30.0 - 36.0 g/dL   RDW 14.3 11.5 - 15.5 %   Platelets 269 150 - 400 K/uL   Neutrophils Relative % 75 %   Lymphocytes Relative 13 %   Monocytes Relative 12 %   Eosinophils Relative 0 %   Basophils Relative 0 %   Neutro Abs 12.8 (H) 1.7 - 7.7 K/uL   Lymphs Abs 2.2 0.7 - 4.0 K/uL   Monocytes Absolute 2.0 (H) 0.1 - 1.0 K/uL   Eosinophils Absolute 0.0 0.0 - 0.7 K/uL   Basophils Absolute 0.0 0.0 - 0.1 K/uL   WBC Morphology VACUOLATED NEUTROPHILS     Comment: Performed at [facility], [address] [place], [place] [address]  I-stat Chem 8, ED (when main lab  is down)     Status: Abnormal   Collection Time: 03/16/17 12:57 PM  Result Value Ref Range  Sodium 141 135 - 145 mmol/L   Potassium 3.8 3.5 - 5.1 mmol/L   Chloride 101 101 - 111 mmol/L   BUN 10 6 - 20 mg/dL   Creatinine, Ser 1.00 0.44 - 1.00 mg/dL   Glucose, Bld 104 (H) 65 - 99 mg/dL   Calcium, Ion 1.15 1.15 - 1.40 mmol/L   TCO2 28 22 - 32 mmol/L   Hemoglobin 12.9 12.0 - 15.0 g/dL   HCT 38.0 36.0 - 46.0 %   Ct Abdomen Pelvis W Contrast  Result Date: 03/16/2017 CLINICAL DATA:  Worsening right lower quadrant abdominal pain over the past few days. EXAM: CT ABDOMEN AND PELVIS WITH CONTRAST TECHNIQUE: Multidetector CT imaging of the abdomen and pelvis was performed using the standard protocol following bolus administration of intravenous contrast. CONTRAST:  115mL ISOVUE-300 IOPAMIDOL (ISOVUE-300) INJECTION 61% COMPARISON:  CT abdomen pelvis dated November 05, 2005. FINDINGS: Lower chest: No acute abnormality. Hepatobiliary: No focal liver abnormality is seen. No gallstones, gallbladder wall thickening, or biliary dilatation. Pancreas: Slightly prominent main pancreatic duct proximally. Otherwise unremarkable. Spleen: Normal in size without focal abnormality. Adrenals/Urinary Tract: The adrenal glands are unremarkable. Unchanged punctate calculus in the midpole of the right kidney. Subcentimeter low-density lesion in the right kidney remains too small to characterize, but is stable. No hydronephrosis. The bladder is unremarkable. Stomach/Bowel: Markedly dilated appendix with surrounding inflammatory changes, consistent with acute appendicitis. Appendix: Location: Right lower quadrant. Diameter: 2.2 cm. Appendicolith: 7 mm appendicolith within the proximal appendix. Mucosal hyper-enhancement: Present. Extraluminal gas: None. Periappendiceal collection: None. The stomach is within normal limits. No bowel obstruction. Extensive left-sided colonic diverticulosis. Vascular/Lymphatic: No significant vascular  findings are present. No enlarged abdominal or pelvic lymph nodes. Reproductive: Uterus and bilateral adnexa are unremarkable. Other: Small amount of free fluid in the pelvis. No pneumoperitoneum. Musculoskeletal: No acute or significant osseous findings. Unchanged bilateral L5 pars defects with grade 2 anterolisthesis at L5-S1. IMPRESSION: 1. Acute, uncomplicated appendicitis. 2. Unchanged punctate right nephrolithiasis. 3. Unchanged bilateral L5 pars defects with grade 2 anterolisthesis at L5-S1. Electronically Signed   By: Titus Dubin M.D.   On: 03/16/2017 13:58    Pending Labs Unresulted Labs (From admission, onward)   Start     Ordered   03/23/17 0500  Creatinine, serum  (enoxaparin (LOVENOX)    CrCl >/= 30 ml/min)  Weekly,   R    Comments:  while on enoxaparin therapy    03/16/17 1809   03/16/17 1817  HIV antibody (Routine Testing)  Add-on,   R     03/16/17 1816      Vitals/Pain Today's Vitals   03/16/17 1732 03/16/17 1900 03/16/17 1930 03/16/17 1940  BP: (!) 128/59 126/70 139/74   Pulse: 93 95 99   Resp: 16  19   Temp: (!) 100.6 F (38.1 C)     TempSrc: Oral     SpO2: 95% (!) 88% 95%   Weight:      Height:      PainSc:    8     Isolation Precautions No active isolations  Medications Medications  fentaNYL (SUBLIMAZE) injection 50 mcg (50 mcg Intravenous Given 03/16/17 1506)  gabapentin (NEURONTIN) capsule 300 mg (not administered)  acetaminophen (TYLENOL) tablet 1,000 mg (not administered)  celecoxib (CELEBREX) capsule 200 mg (not administered)  Chlorhexidine Gluconate Cloth 2 % PADS 6 each (not administered)    And  Chlorhexidine Gluconate Cloth 2 % PADS 6 each (not administered)  cefTRIAXone (ROCEPHIN) 2 g in dextrose 5 % 50  mL IVPB (not administered)    And  metroNIDAZOLE (FLAGYL) IVPB 500 mg (not administered)  enoxaparin (LOVENOX) injection 40 mg (not administered)  lactated ringers infusion (not administered)  diphenhydrAMINE (BENADRYL) 12.5 MG/5ML elixir  12.5 mg (not administered)    Or  diphenhydrAMINE (BENADRYL) injection 12.5 mg (not administered)  ondansetron (ZOFRAN-ODT) disintegrating tablet 4 mg (not administered)    Or  ondansetron (ZOFRAN) injection 4 mg (not administered)  simethicone (MYLICON) chewable tablet 40 mg (not administered)  cefTRIAXone (ROCEPHIN) 2 g in dextrose 5 % 50 mL IVPB (not administered)    And  metroNIDAZOLE (FLAGYL) IVPB 500 mg (not administered)  lactated ringers bolus 1,000 mL (not administered)  lactated ringers bolus 1,000 mL (not administered)  methocarbamol (ROBAXIN) 1,000 mg in dextrose 5 % 50 mL IVPB (not administered)  HYDROmorphone (DILAUDID) injection 0.5-2 mg (1 mg Intravenous Given 03/16/17 1940)  acetaminophen (TYLENOL) tablet 1,000 mg (not administered)  gabapentin (NEURONTIN) capsule 300 mg (not administered)  prochlorperazine (COMPAZINE) injection 5-10 mg (not administered)  metoCLOPramide (REGLAN) injection 10 mg (not administered)  ondansetron (ZOFRAN) injection 4 mg (not administered)    Or  ondansetron (ZOFRAN) 8 mg in sodium chloride 0.9 % 50 mL IVPB (not administered)  lip balm (CARMEX) ointment 1 application (not administered)  magic mouthwash (not administered)  guaiFENesin-dextromethorphan (ROBITUSSIN DM) 100-10 MG/5ML syrup 10 mL (not administered)  hydrocortisone (ANUSOL-HC) 2.5 % rectal cream 1 application (not administered)  alum & mag hydroxide-simeth (MAALOX/MYLANTA) 200-200-20 MG/5ML suspension 30 mL (not administered)  hydrocortisone cream 1 % 1 application (not administered)  menthol-cetylpyridinium (CEPACOL) lozenge 3 mg (not administered)  phenol (CHLORASEPTIC) mouth spray 1-2 spray (not administered)  fluticasone (FLONASE) 50 MCG/ACT nasal spray 2 spray (not administered)  propranolol (INDERAL) tablet 20 mg (not administered)  QUEtiapine (SEROQUEL XR) 24 hr tablet 600 mg (not administered)  LORazepam (ATIVAN) injection 0.5-1 mg (not administered)  haloperidol lactate  (HALDOL) injection 2-5 mg (not administered)  pantoprazole (PROTONIX) EC tablet 40 mg (not administered)  saccharomyces boulardii (FLORASTOR) capsule 250 mg (not administered)  sodium chloride 0.9 % bolus 1,000 mL (0 mLs Intravenous Stopped 03/16/17 1438)  ondansetron (ZOFRAN) injection 4 mg (4 mg Intravenous Given 03/16/17 1304)  morphine 4 MG/ML injection 4 mg (4 mg Intravenous Given 03/16/17 1307)  iopamidol (ISOVUE-300) 61 % injection 100 mL (100 mLs Intravenous Contrast Given 03/16/17 1333)  cefTRIAXone (ROCEPHIN) 2 g in dextrose 5 % 50 mL IVPB (0 g Intravenous Stopped 03/16/17 1504)    And  metroNIDAZOLE (FLAGYL) IVPB 500 mg (0 mg Intravenous Stopped 03/16/17 1729)  ondansetron (ZOFRAN) injection 4 mg (4 mg Intravenous Given 03/16/17 1505)  morphine 4 MG/ML injection 4 mg (4 mg Intravenous Given 03/16/17 1548)    Mobility Ambulatory with assistance

## 2017-03-16 NOTE — Telephone Encounter (Signed)
Pt at ED.

## 2017-03-16 NOTE — ED Notes (Signed)
Report called to charge nurse, Marzetta Board RN at [employer] [facility]. Will call back when [facility] has arrived

## 2017-03-16 NOTE — ED Notes (Addendum)
Given sip of soda and took couple bites of frozen meal. ED provider informed, reminded of NPO

## 2017-03-16 NOTE — ED Provider Notes (Signed)
Patient transferred from [facility] and care assumed from [place], [place], we see her note for full details, but in brief Beverly Cole is a 57 y.o. female who presents with 2 days of right lower quadrant pain, associated with nausea, chills, no noted fevers, no diarrhea.  CT abdomen pelvis shows evidence of uncomplicated appendicitis.  Patient has a white count of 17, labs otherwise unremarkable.  Pain controlled with morphine, Zofran, patient also given fluid bolus.  Patient started on ceftriaxone and Flagyl.  PA Alroy Dust spoke with Dr. gross with general surgery, who plans to see the patient for surgical evaluation.  6:00 PM On evaluation, Dr. Johney Maine with general surgery is at the bedside, he will admit to surgery service with plans to go to the OR this even for laparoscopic appendectomy     Jacqlyn Larsen, PA-C 03/16/17 Cameron Proud, MD 03/17/17 1515

## 2017-03-16 NOTE — Progress Notes (Signed)
Pt gives permission for her husband to remain in her room while the questions on the nursing admission history are being asked. Lucius Conn BSN, RN-BC Admissions RN 03/16/2017 7:21 PM

## 2017-03-16 NOTE — H&P (Signed)
[place] [place], [address] [address] Phone: [phone] FAX: [place]  1960-07-15 [id number]  CARE TEAM:  PCP: Carollee Herter, Alferd Apa, DO  Outpatient Care Team: Patient Care Team: Carollee Herter, Alferd Apa, DO as PCP - General (Family Medicine) Kem Boroughs, [place] as Nurse Practitioner (Nurse Practitioner) Kathrynn Ducking, MD as Consulting Physician (Neurology) Melida Quitter, MD as Consulting Physician Hollice Espy Phylis Bougie, CNM as Referring Physician (Certified Nurse Midwife) Juanita Craver, MD as Consulting Physician (Gastroenterology) Marin Olp Rudell Cobb, MD as Consulting Physician (Oncology) Cottle, Billey Co., MD as Attending Physician (Psychiatry) Edilia Bo, Candida Peeling, LCSW (Inactive) as Social Worker (Licensed Clinical Social Worker) Angelena Form, Annita Brod, MD as Consulting Physician (Cardiology) Sharol Roussel, [place] as Physician Assistant (Optometry) [place], Marlou Sa, [place] (Chiropractic Medicine) Kem Boroughs, [place] as Nurse Practitioner (Family Medicine)  Inpatient Treatment Team: Treatment Team: Attending Provider: Nolon Nations, MD; Consulting Physician: Edison Pace, Md, MD; Technician: Estill Bamberg, [place]; Physician Assistant: Janet Berlin; Registered Nurse: Marilynne Drivers, RN   This patient is a 57 y.o.female who presents today for surgical evaluation at the request of Jola Schmidt, [facility] ED.   Chief complaint / Reason for evaluation: Appendicitis  Middle-aged woman with history of anxiety or depression, bipolar disorder, IBS with constipation, hypertension.  Lupus anticoagulant positive with pulmonary embolism December 2017.  Finished 1 year of Xarelto anticoagulation and followed by hematology.  History of atrial fibrillation noted in 2013.  Patient notes abdominal pain starting 2 days ago in the lower abdomen.  More focal in the right lower side now.  Has had some intermittent crampy pains in the past  week but different these past 2 days.  Takes Linzess to help with IBS constipation issues.  Last bowel movement about 2 days ago.  Some symptomatic fevers.  Worse with cough and car ride and bed shake.  Symptoms worsened.  Encouraged by her husband to come to emergency room.  Exam history and CT scan very suspicious for appendicitis.  Surgical consultation requested.  Patient transferred to [facility] where general surgery is available.  No personal nor family history of GI/colon cancer, inflammatory bowel disease, allergy such as Celiac Sprue, dietary/dairy problems, colitis, ulcers nor gastritis.  No recent sick contacts/gastroenteritis.  No travel outside the country.  No changes in diet.  No dysphagia to solids or liquids.  No significant heartburn or reflux.  No hematochezia, hematemesis, coffee ground emesis.  No evidence of prior gastric/peptic ulceration.    Assessment  Beverly Cole  57 y.o. female       Problem List:  Principal Problem:   Acute appendicitis Active Problems:   Hypothyroidism   Bipolar 2 disorder ([place])   History of pulmonary embolus - Dec 2017   Lupus anticoagulant positive   Irritable bowel syndrome (IBS)   Anxiety and depression   Acute appendicitis  Plan:  Admit  IV antibiotics.  IV fluids.  Diagnostic laparoscopy with appendectomy.  Lovenox for DVT prophylaxis especially in the setting of pulmonary embolus 14 months ago.  Anxiolysis.  Consult chaplaincy and perhaps other consults to help through support.  -VTE prophylaxis- SCDs, etc  -mobilize as tolerated to help recovery  45 minutes spent in review, evaluation, examination, counseling, and coordination of care.  More than 50% of that time was spent in counseling.  Adin Hector, M.D., F.A.C.S. Gastrointestinal and Minimally Invasive Surgery [facility] [phone]  [address], [address] [place], [place] [address] [phone] Main /  Paging   03/16/2017      Past Medical History:  Diagnosis Date  . Anxiety   . Atrial fibrillation ([place])    a. Event monitor 2013 - PACs/bradycardia/SVT/short run of atrial fib by event monitor.   Marland Kitchen BIPOLAR DISORDER UNSPECIFIED   . Bradycardia   . Bursitis of hip 09/1999   Bilateral - Dr. Berenice Primas  . DEPRESSION   . Emphysema lung ([place]) 06/20/2016   pt states pulmonalongist stated started recently  . GERD   . Headache(784.0)    was with menstrual cycle. no longer a problem  . HYPERLIPIDEMIA   . Hypertension   . Hypertension   . IRRITABLE BOWEL SYNDROME, HX OF   . Menopausal state 01/2012   FSH = 88.5  . Mitral valve prolapse 12/17/2000   a. dx 1980s, most recent echo did not demonstrate this.  Marland Kitchen MYALGIA   . Normal coronary arteries    a. by cardiac CT 2013.  Marland Kitchen PAT (paroxysmal atrial tachycardia) ([place])   . PE (pulmonary embolism) 02/09/2015   a. Bilateral PEs 01/2015 when d-dimer 0.69, CP lying on left side. Followed by heme-onc - + lupus anticoagulant, mildly depressed protein S. Dr. Marin Olp is not certain if her hypercoagulable studies are significant for a thrombophilic state.  . Premature atrial contractions   . Pulmonary embolus ([place]) 02/10/2015  . Shortness of breath    Pulmonary eval 05/2002  . Thyroid nodule 2014  . Tremors of nervous system     Past Surgical History:  Procedure Laterality Date  . COLONOSCOPY    . Lipoma (R) side  1990   Right back    Social History   Socioeconomic History  . Marital status: Married    Spouse name: Not on file  . Number of children: 0  . Years of education: Not on file  . Highest education level: Not on file  Social Needs  . Financial resource strain: Not on file  . Food insecurity - worry: Not on file  . Food insecurity - inability: Not on file  . Transportation needs - medical: Not on file  . Transportation needs - non-medical: Not on file  Occupational History  . Occupation: Nurse-Personal Care/HH    Employer:  UNEMPLOYED  Tobacco Use  . Smoking status: Former Smoker    Packs/day: 1.00    Years: 25.00    Pack years: 25.00    Types: Cigarettes    Last attempt to quit: 02/11/1996    Years since quitting: 21.1  . Smokeless tobacco: Never Used  . Tobacco comment: Married, lives with spouse. 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 Alcohol use: Yes    Comment: occ  . Drug use: No  . Sexual activity: Yes    Partners: Male    Birth control/protection: Post-menopausal  Other Topics Concern  . Not on file  Social History Narrative   Married, lives with spouse in [place]. 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 Lung cancer Mother   . Hypertension Mother   . Hyperlipidemia Mother   . Cancer Father        Esophageal cancer  . Hyperlipidemia Father   . Depression Father   . Cancer Brother   .  Pulmonary embolism Brother   . Colon cancer Paternal Uncle   . Sarcoidosis Other   . Testicular cancer Other     Current Facility-Administered Medications  Medication Dose Route Frequency Provider Last Rate Last Dose  . acetaminophen (TYLENOL) tablet 650 mg  650 mg Oral Q6H PRN Michael Boston, MD       Or  . acetaminophen (TYLENOL) suppository 650 mg  650 mg Rectal Q6H PRN Michael Boston, MD      . Derrill Memo ON 03/17/2017] acetaminophen (TYLENOL) tablet 1,000 mg  1,000 mg Oral On Call to OR Michael Boston, MD      . acetaminophen (TYLENOL) tablet 1,000 mg  1,000 mg Oral TID Michael Boston, MD      . ALPRAZolam Duanne Moron) tablet 0.25-0.5 mg  0.25-0.5 mg Oral QHS PRN Michael Boston, MD      . alum & mag hydroxide-simeth (MAALOX/MYLANTA) 200-200-20 MG/5ML suspension 30 mL  30 mL Oral Q6H PRN Michael Boston, MD      . aspirin EC tablet 81 mg  81 mg Oral Daily Michael Boston, MD      . Derrill Memo ON 03/17/2017] cefTRIAXone (ROCEPHIN) 2 g in dextrose 5 % 50 mL IVPB  2 g Intravenous  On Call to OR Michael Boston, MD       And  . metroNIDAZOLE (FLAGYL) IVPB 500 mg  500 mg Intravenous Tor Netters, MD      . cefTRIAXone (ROCEPHIN) 2 g in dextrose 5 % 50 mL IVPB  2 g Intravenous Q24H Michael Boston, MD       And  . metroNIDAZOLE (FLAGYL) IVPB 500 mg  500 mg Intravenous Tor Netters, MD      . Derrill Memo ON 03/17/2017] celecoxib (CELEBREX) capsule 200 mg  200 mg Oral On Call to OR Michael Boston, MD      . Chlorhexidine Gluconate Cloth 2 % PADS 6 each  6 each Topical Once Michael Boston, MD       And  . Chlorhexidine Gluconate Cloth 2 % PADS 6 each  6 each Topical Once Michael Boston, MD      . diphenhydrAMINE (BENADRYL) 12.5 MG/5ML elixir 12.5 mg  12.5 mg Oral Q6H PRN Michael Boston, MD       Or  . diphenhydrAMINE (BENADRYL) injection 12.5 mg  12.5 mg Intravenous Q6H PRN Michael Boston, MD      . divalproex (DEPAKOTE) DR tablet 1,000 mg  1,000 mg Oral Ardeen Fillers, MD      . enoxaparin (LOVENOX) injection 40 mg  40 mg Subcutaneous Q24H Michael Boston, MD      . fentaNYL (SUBLIMAZE) injection 50 mcg  50 mcg Intravenous Q1H PRN Avie Echevaria B, PA-C   50 mcg at 03/16/17 1506  . fluticasone (FLONASE) 50 MCG/ACT nasal spray 2 spray  2 spray Each Nare Daily Michael Boston, MD      . Derrill Memo ON 03/17/2017] gabapentin (NEURONTIN) capsule 300 mg  300 mg Oral On Call to OR Michael Boston, MD      . gabapentin (NEURONTIN) capsule 300 mg  300 mg Oral Ardeen Fillers, MD      . guaiFENesin-dextromethorphan (ROBITUSSIN DM) 100-10 MG/5ML syrup 10 mL  10 mL Oral Q4H PRN Michael Boston, MD      . haloperidol lactate (HALDOL) injection 2-5 mg  2-5 mg Intravenous Q6H PRN Michael Boston, MD      . hydrocortisone (ANUSOL-HC) 2.5 % rectal cream 1 application  1 application Topical QID PRN Michael Boston, MD      .  hydrocortisone cream 1 % 1 application  1 application Topical TID PRN Michael Boston, MD      . HYDROmorphone (DILAUDID) injection 0.5-2 mg  0.5-2 mg Intravenous Q2H PRN Michael Boston, MD       . lactated ringers bolus 1,000 mL  1,000 mL Intravenous Once Michael Boston, MD      . lactated ringers bolus 1,000 mL  1,000 mL Intravenous Q8H PRN Michael Boston, MD      . lactated ringers infusion   Intravenous Continuous Michael Boston, MD      . Derrill Memo ON 03/17/2017] linaclotide (LINZESS) capsule 145 mcg  145 mcg Oral QAC breakfast Michael Boston, MD      . lip balm (CARMEX) ointment 1 application  1 application Topical BID Michael Boston, MD      . LORazepam (ATIVAN) injection 0.5-1 mg  0.5-1 mg Intravenous Q8H PRN Michael Boston, MD      . magic mouthwash  15 mL Oral QID PRN Michael Boston, MD      . menthol-cetylpyridinium (CEPACOL) lozenge 3 mg  1 lozenge Oral PRN Michael Boston, MD      . methocarbamol (ROBAXIN) 1,000 mg in dextrose 5 % 50 mL IVPB  1,000 mg Intravenous Q6H PRN Michael Boston, MD      . metoCLOPramide (REGLAN) injection 10 mg  10 mg Intravenous Q6H PRN Michael Boston, MD      . ondansetron (ZOFRAN) injection 4 mg  4 mg Intravenous Q6H PRN Michael Boston, MD       Or  . ondansetron (ZOFRAN) 8 mg in sodium chloride 0.9 % 50 mL IVPB  8 mg Intravenous Q6H PRN Michael Boston, MD      . ondansetron (ZOFRAN-ODT) disintegrating tablet 4 mg  4 mg Oral Q6H PRN Michael Boston, MD       Or  . ondansetron (ZOFRAN) injection 4 mg  4 mg Intravenous Q6H PRN Michael Boston, MD      . phenol (CHLORASEPTIC) mouth spray 1-2 spray  1-2 spray Mouth/Throat PRN Michael Boston, MD      . prochlorperazine (COMPAZINE) injection 5-10 mg  5-10 mg Intravenous Q4H PRN Michael Boston, MD      . propranolol (INDERAL) tablet 20 mg  20 mg Oral Daily PRN Michael Boston, MD      . QUEtiapine (SEROQUEL) tablet 600 mg  600 mg Oral Daily Michael Boston, MD      . simethicone [facility]) chewable tablet 40 mg  40 mg Oral Q6H PRN Michael Boston, MD       Current Outpatient Medications  Medication Sig Dispense Refill  . ALPRAZolam (XANAX) 0.5 MG tablet Take 0.25-0.5 mg by mouth at bedtime as needed for sleep.    Marland Kitchen aspirin  EC 81 MG tablet Take 81 mg by mouth daily.    Marland Kitchen CARTIA XT 180 MG 24 hr capsule TAKE 1 CAPSULE BY MOUTH DAILY. 90 capsule 1  . dexlansoprazole (DEXILANT) 60 MG capsule Take 60 mg by mouth daily.    . Divalproex Sodium (DEPAKOTE PO) Take 1,000 mg by mouth at bedtime.    . fluticasone (FLONASE) 50 MCG/ACT nasal spray Place 2 sprays into the nose daily as needed. For allergies    . ketoconazole (NIZORAL) 2 % cream Apply 1 application topically daily. 60 g 3  . lidocaine (XYLOCAINE) 2 % jelly Apply topically as needed (for dysparunia). 30 mL 12  . Linaclotide (LINZESS PO) Take by mouth daily.    . nitrofurantoin, macrocrystal-monohydrate, (MACROBID) 100 MG capsule Take  1 capsule (100 mg total) by mouth once as needed (post coital). 30 capsule 8  . pramipexole (MIRAPEX) 1 MG tablet Take 1 mg by mouth every evening.   0  . Probiotic Product (PROBIOTIC DAILY PO) Take 1 tablet by mouth daily.     . propranolol (INDERAL) 10 MG tablet Take 20 mg by mouth daily as needed (shakiness). Reported on 05/17/2015  1  . QUEtiapine Fumarate (SEROQUEL PO) 600 mg daily.        Allergies  Allergen Reactions  . Cariprazine Other (See Comments)    Patient becomes suicidal when taking this medication.   . Lopressor [Metoprolol] Other (See Comments)    Hallucinations hair falls out  . Nickel Rash    ROS:   All other systems reviewed & are negative except per HPI or as noted below: Constitutional:  No fevers, chills, sweats.  Weight stable Eyes:  No vision changes, No discharge HENT:  No sore throats, nasal drainage Lymph: No neck swelling, No bruising easily Pulmonary:  No cough, productive sputum CV: No orthopnea, PND  Patient walks 20 minutes without difficulty.  No exertional chest/neck/shoulder/arm pain. GI: No personal nor family history of GI/colon cancer, inflammatory bowel disease, allergy such as Celiac Sprue, dietary/dairy problems, colitis, ulcers nor gastritis.  No recent sick contacts/gastroenteritis.   No travel outside the country.  No changes in diet. Renal: No UTIs, No hematuria Genital:  No drainage, bleeding, masses Musculoskeletal: No severe joint pain.  Good ROM major joints Skin:  No sores or lesions.  No rashes Heme/Lymph:  No easy bleeding.  No swollen lymph nodes Neuro: No focal weakness/numbness.  No seizures Psych: Curious about DO NOT RESUSCITATE but denies suicidal ideation.  No hallucinations.  Chronic anxiety and some depression as well.  BP (!) 128/59 (BP Location: Left Arm)   Pulse 93   Temp (!) 100.6 F (38.1 C) (Oral)   Resp 16   Ht 5\' 9"  (1.753 m)   Wt 77.1 kg (170 lb)   LMP 05/11/2012   SpO2 95%   BMI 25.10 kg/m   Physical Exam: General: Pt awake/alert/oriented x4 in mild major acute distress Eyes: PERRL, normal EOM. Sclera nonicteric Neuro: CN II-XII intact w/o focal sensory/motor deficits. Lymph: No head/neck/groin lymphadenopathy Psych:  No delerium/psychosis/paranoia.  Mildly anxious but consolable. HENT: Normocephalic, Mucus membranes moist.  No thrush Neck: Supple, No tracheal deviation Chest: No pain.  Good respiratory excursion. CV:  Pulses intact.  Regular rhythm Abdomen: Soft, mildly distended.  Obese.  Obvious tenderness to palpation right lower quadrant over McBurney's point with focal peritonitis.  Left side of abdomen and right upper quadrant Nontender.  No incarcerated hernias. Gen:  No inguinal hernias.  No inguinal lymphadenopathy.   Ext:  SCDs BLE.  No significant edema.  No cyanosis Skin: No petechiae / purpurea.  No major sores Musculoskeletal: No severe joint pain.  Good ROM major joints   Results:   Labs: Results for orders placed or performed during the hospital encounter of 03/16/17 (from the past 48 hour(s))  Urinalysis, Routine w reflex microscopic     Status: Abnormal   Collection Time: 03/16/17 12:35 PM  Result Value Ref Range   Color, Urine YELLOW YELLOW   APPearance CLEAR CLEAR   Specific Gravity, Urine <1.005 (L)  1.005 - 1.030   pH 7.0 5.0 - 8.0   Glucose, UA NEGATIVE NEGATIVE mg/dL   Hgb urine dipstick NEGATIVE NEGATIVE   Bilirubin Urine NEGATIVE NEGATIVE   Ketones, ur NEGATIVE NEGATIVE  mg/dL   Protein, ur NEGATIVE NEGATIVE mg/dL   Nitrite NEGATIVE NEGATIVE   Leukocytes, UA MODERATE (A) NEGATIVE    Comment: Performed at [facility], [address] [place], [place] [address]  Urinalysis, Microscopic (reflex)     Status: Abnormal   Collection Time: 03/16/17 12:35 PM  Result Value Ref Range   RBC / HPF 0-5 0 - 5 RBC/hpf   WBC, UA 6-30 0 - 5 WBC/hpf   Bacteria, UA RARE (A) NONE SEEN   Squamous Epithelial / LPF 0-5 (A) NONE SEEN   Mucus PRESENT     Comment: Performed at [facility], [address] [place], [place] [address]  Hepatic function panel     Status: None   Collection Time: 03/16/17 12:47 PM  Result Value Ref Range   Total Protein 7.5 6.5 - 8.1 g/dL   Albumin 3.6 3.5 - 5.0 g/dL   AST 23 15 - 41 U/L   ALT 19 14 - 54 U/L   Alkaline Phosphatase 65 38 - 126 U/L   Total Bilirubin 0.6 0.3 - 1.2 mg/dL   Bilirubin, Direct 0.1 0.1 - 0.5 mg/dL   Indirect Bilirubin 0.5 0.3 - 0.9 mg/dL    Comment: Performed at [facility], [address] [place], [place] [address]  Lipase, blood     Status: None   Collection Time: 03/16/17 12:47 PM  Result Value Ref Range   Lipase 23 11 - 51 U/L    Comment: Performed at [facility], [address] [place], [place] [address]  CBC with Differential     Status: Abnormal   Collection Time: 03/16/17 12:47 PM  Result Value Ref Range   WBC 17.0 (H) 4.0 - 10.5 K/uL   RBC 4.44 3.87 - 5.11 MIL/uL   Hemoglobin 13.0 12.0 - 15.0 g/dL   HCT 39.4 36.0 - 46.0 %   MCV 88.7 78.0 - 100.0 fL   MCH 29.3 26.0 - 34.0 pg   MCHC 33.0 30.0 - 36.0 g/dL   RDW 14.3 11.5 - 15.5 %   Platelets 269 150 - 400 K/uL   Neutrophils Relative % 75 %   Lymphocytes Relative 13 %   Monocytes Relative 12 %   Eosinophils Relative 0 %    Basophils Relative 0 %   Neutro Abs 12.8 (H) 1.7 - 7.7 K/uL   Lymphs Abs 2.2 0.7 - 4.0 K/uL   Monocytes Absolute 2.0 (H) 0.1 - 1.0 K/uL   Eosinophils Absolute 0.0 0.0 - 0.7 K/uL   Basophils Absolute 0.0 0.0 - 0.1 K/uL   WBC Morphology VACUOLATED NEUTROPHILS     Comment: Performed at [facility], [address] [place], [place] [address]  I-stat Chem 8, ED (when main lab is down)     Status: Abnormal   Collection Time: 03/16/17 12:57 PM  Result Value Ref Range   Sodium 141 135 - 145 mmol/L   Potassium 3.8 3.5 - 5.1 mmol/L   Chloride 101 101 - 111 mmol/L   BUN 10 6 - 20 mg/dL   Creatinine, Ser 1.00 0.44 - 1.00 mg/dL   Glucose, Bld 104 (H) 65 - 99 mg/dL   Calcium, Ion 1.15 1.15 - 1.40 mmol/L   TCO2 28 22 - 32 mmol/L   Hemoglobin 12.9 12.0 - 15.0 g/dL   HCT 38.0 36.0 - 46.0 %    Imaging / Studies: Ct Abdomen Pelvis W Contrast  Result  Date: 03/16/2017 CLINICAL DATA:  Worsening right lower quadrant abdominal pain over the past few days. EXAM: CT ABDOMEN AND PELVIS WITH CONTRAST TECHNIQUE: Multidetector CT imaging of the abdomen and pelvis was performed using the standard protocol following bolus administration of intravenous contrast. CONTRAST:  174mL ISOVUE-300 IOPAMIDOL (ISOVUE-300) INJECTION 61% COMPARISON:  CT abdomen pelvis dated November 05, 2005. FINDINGS: Lower chest: No acute abnormality. Hepatobiliary: No focal liver abnormality is seen. No gallstones, gallbladder wall thickening, or biliary dilatation. Pancreas: Slightly prominent main pancreatic duct proximally. Otherwise unremarkable. Spleen: Normal in size without focal abnormality. Adrenals/Urinary Tract: The adrenal glands are unremarkable. Unchanged punctate calculus in the midpole of the right kidney. Subcentimeter low-density lesion in the right kidney remains too small to characterize, but is stable. No hydronephrosis. The bladder is unremarkable. Stomach/Bowel: Markedly dilated appendix with surrounding  inflammatory changes, consistent with acute appendicitis. Appendix: Location: Right lower quadrant. Diameter: 2.2 cm. Appendicolith: 7 mm appendicolith within the proximal appendix. Mucosal hyper-enhancement: Present. Extraluminal gas: None. Periappendiceal collection: None. The stomach is within normal limits. No bowel obstruction. Extensive left-sided colonic diverticulosis. Vascular/Lymphatic: No significant vascular findings are present. No enlarged abdominal or pelvic lymph nodes. Reproductive: Uterus and bilateral adnexa are unremarkable. Other: Small amount of free fluid in the pelvis. No pneumoperitoneum. Musculoskeletal: No acute or significant osseous findings. Unchanged bilateral L5 pars defects with grade 2 anterolisthesis at L5-S1. IMPRESSION: 1. Acute, uncomplicated appendicitis. 2. Unchanged punctate right nephrolithiasis. 3. Unchanged bilateral L5 pars defects with grade 2 anterolisthesis at L5-S1. Electronically Signed   By: Titus Dubin M.D.   On: 03/16/2017 13:58    Medications / Allergies: per chart  Antibiotics: Anti-infectives (From admission, onward)   Start     Dose/Rate Route Frequency Ordered Stop   03/17/17 0600  cefTRIAXone (ROCEPHIN) 2 g in dextrose 5 % 50 mL IVPB     2 g 100 mL/hr over 30 Minutes Intravenous On call to O.R. 03/16/17 1809 03/18/17 0559   03/16/17 1815  cefTRIAXone (ROCEPHIN) 2 g in dextrose 5 % 50 mL IVPB     2 g 100 mL/hr over 30 Minutes Intravenous Every 24 hours 03/16/17 1809     03/16/17 1815  metroNIDAZOLE (FLAGYL) IVPB 500 mg     500 mg 100 mL/hr over 60 Minutes Intravenous Every 8 hours 03/16/17 1809     03/16/17 1809  metroNIDAZOLE (FLAGYL) IVPB 500 mg     500 mg 100 mL/hr over 60 Minutes Intravenous Every 8 hours 03/16/17 1809     03/16/17 1415  cefTRIAXone (ROCEPHIN) 2 g in dextrose 5 % 50 mL IVPB     2 g 100 mL/hr over 30 Minutes Intravenous  Once 03/16/17 1410 03/16/17 1504   03/16/17 1415  metroNIDAZOLE (FLAGYL) IVPB 500 mg     500  mg 100 mL/hr over 60 Minutes Intravenous  Once 03/16/17 1410 03/16/17 1729        Note: Portions of this report may have been transcribed using voice recognition software. Every effort was made to ensure accuracy; however, inadvertent computerized transcription errors may be present.   Any transcriptional errors that result from this process are unintentional.    Adin Hector, M.D., F.A.C.S. Gastrointestinal and Minimally Invasive Surgery [facility] [address], [place], [place] [address] [phone] Main / Paging   03/16/2017

## 2017-03-16 NOTE — ED Notes (Signed)
Family at bedside. 

## 2017-03-16 NOTE — ED Notes (Signed)
Per [facility]-RLQ pain, positive acute appey-1 mg of dilaudid given in route-transfer from [facility]

## 2017-03-16 NOTE — ED Provider Notes (Addendum)
[facility] Provider Note   CSN: [id number] Arrival date & time: 03/16/17  1130     History   Chief Complaint Chief Complaint  Patient presents with  . Abdominal Pain    HPI Beverly Cole is a 57 y.o. female with past medical history significant for anxiety/depression, A. fib, bipolar, IBS, hypertension, PE (no longer on anticoagulant) presenting with 2 days of intermittent nonradiating sharp lower abdominal pain bilaterally.  She also reports epigastric ache that has been constant.  Husband in the room adding that she mentions some pain last week as well.  The pain comes in waves and associated with nausea but no vomiting. Denies dysuria, hematuria, frequency, vaginal discharge, abnormal bleeding.  Last bowel movement was 2 days ago and baseline for patient, no melena or blood in the stool.  She endorses chills but denies fever.  No diarrhea, cough, congestion or other symptoms.  Pain is aggravated by movement.  HPI  Past Medical History:  Diagnosis Date  . Anxiety   . Atrial fibrillation ([place])    a. Event monitor 2013 - PACs/bradycardia/SVT/short run of atrial fib by event monitor.   Marland Kitchen BIPOLAR DISORDER UNSPECIFIED   . Bradycardia   . Bursitis of hip 09/1999   Bilateral - Dr. Berenice Primas  . DEPRESSION   . Emphysema lung ([place]) 06/20/2016   pt states pulmonalongist stated started recently  . GERD   . Headache(784.0)    was with menstrual cycle. no longer a problem  . HYPERLIPIDEMIA   . Hypertension   . Hypertension   . IRRITABLE BOWEL SYNDROME, HX OF   . Menopausal state 01/2012   FSH = 88.5  . Mitral valve prolapse 12/17/2000   a. dx 1980s, most recent echo did not demonstrate this.  Marland Kitchen MYALGIA   . Normal coronary arteries    a. by cardiac CT 2013.  Marland Kitchen PAT (paroxysmal atrial tachycardia) ([place])   . PE (pulmonary embolism) 02/09/2015   a. Bilateral PEs 01/2015 when d-dimer 0.69, CP lying on left side. Followed by heme-onc - + lupus anticoagulant,  mildly depressed protein S. Dr. Marin Olp is not certain if her hypercoagulable studies are significant for a thrombophilic state.  . Premature atrial contractions   . Shortness of breath    Pulmonary eval 05/2002  . Thyroid nodule 2014  . Tremors of nervous system     Patient Active Problem List   Diagnosis Date Noted  . Need for shingles vaccine 11/25/2016  . Shortness of breath 04/25/2016  . Pulmonary embolus ([place]) 02/10/2015  . Chest pain 02/09/2015  . Severe recurrent major depression without psychotic features ([place])   . Goiter, nontoxic, multinodular 12/18/2014  . Multinodular goiter 10/27/2012  . Tremor 05/09/2012  . SVT (supraventricular tachycardia) ([place]) 12/03/2011  . Hypertension 08/26/2010  . Hyperlipidemia 03/13/2009  . Bipolar 2 disorder ([place]) 03/13/2009  . Hypothyroidism 07/06/2008  . GERD 11/05/2006  . ACNE NEC 11/05/2006  . DEPRESSION 07/23/2006    Past Surgical History:  Procedure Laterality Date  . COLONOSCOPY    . Lipoma (R) side  1990   Right back    OB History    Gravida Para Term Preterm AB Living   0 0           SAB TAB Ectopic Multiple Live Births                   Home Medications    Prior to Admission medications   Medication Sig Start Date  End Date Taking? Authorizing Provider  ALPRAZolam Duanne Moron) 0.5 MG tablet Take 0.25-0.5 mg by mouth at bedtime as needed for sleep.    [provider]  aspirin EC 81 MG tablet Take 81 mg by mouth daily.    [provider]  CARTIA XT 180 MG 24 hr capsule TAKE 1 CAPSULE BY MOUTH DAILY. 11/20/16   Mosie Lukes, MD  dexlansoprazole (DEXILANT) 60 MG capsule Take 60 mg by mouth daily.    [provider]  Divalproex Sodium (DEPAKOTE PO) Take 1,000 mg by mouth at bedtime.    [provider]  fluticasone (FLONASE) 50 MCG/ACT nasal spray Place 2 sprays into the nose daily as needed. For allergies    [provider]  ketoconazole (NIZORAL) 2 % cream Apply 1 application  topically daily. 11/25/16   Roma Schanz R, DO  lidocaine (XYLOCAINE) 2 % jelly Apply topically as needed (for dysparunia). 06/05/15   Kem Boroughs, FNP  Linaclotide (LINZESS PO) Take by mouth daily.    [provider]  nitrofurantoin, macrocrystal-monohydrate, (MACROBID) 100 MG capsule Take 1 capsule (100 mg total) by mouth once as needed (post coital). 06/05/15   Kem Boroughs, FNP  pramipexole (MIRAPEX) 1 MG tablet Take 1 mg by mouth every evening.  06/18/15   [provider]  Probiotic Product (PROBIOTIC DAILY PO) Take 1 tablet by mouth daily.     [provider]  propranolol (INDERAL) 10 MG tablet Take 20 mg by mouth daily as needed (shakiness). Reported on 05/17/2015 11/21/14   [provider]  QUEtiapine Fumarate (SEROQUEL PO) 600 mg daily.  02/11/16   [provider]    Family History Family History  Problem Relation Age of Onset  . Lung cancer Mother   . Hypertension Mother   . Hyperlipidemia Mother   . Cancer Father        Esophageal cancer  . Hyperlipidemia Father   . Depression Father   . Cancer Brother   . Pulmonary embolism Brother   . Colon cancer Paternal Uncle   . Sarcoidosis Other   . Testicular cancer Other     Social History Social History   Tobacco Use  . Smoking status: Former Smoker    Packs/day: 1.00    Years: 25.00    Pack years: 25.00    Types: Cigarettes    Last attempt to quit: 02/11/1996    Years since quitting: 21.1  . Smokeless tobacco: Never Used  . Tobacco comment: Married, lives with spouse. 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 Alcohol use: Yes    Comment: occ  . Drug use: No     Allergies   Cariprazine; Lopressor [metoprolol]; and Nickel   Review of Systems Review of Systems  Constitutional: Positive for chills. Negative for diaphoresis and fever.  HENT: Negative for congestion.   Respiratory: Negative for cough, choking, chest tightness, shortness of  breath, wheezing and stridor.   Cardiovascular: Negative for chest pain, palpitations and leg swelling.  Gastrointestinal: Positive for abdominal distention, abdominal pain and nausea. Negative for blood in stool, constipation, diarrhea and vomiting.  Genitourinary: Negative for difficulty urinating, dysuria, flank pain, hematuria, pelvic pain, urgency, vaginal bleeding, vaginal discharge and vaginal pain.  Musculoskeletal: Negative for arthralgias, back pain, gait problem, joint swelling, neck pain and neck stiffness.  Skin: Negative for color change, pallor and rash.  Neurological: Negative for dizziness, weakness, light-headedness, numbness and headaches.  Psychiatric/Behavioral: Negative for behavioral problems.  Physical Exam Updated Vital Signs BP 132/64 (BP Location: Right Arm)   Pulse 92   Temp 98.6 F (37 C) (Oral)   Resp 16   Ht 5\' 9"  (1.753 m)   Wt 77.1 kg (170 lb)   LMP 05/11/2012   SpO2 96%   BMI 25.10 kg/m   Physical Exam  Constitutional: She appears well-developed and well-nourished.  Non-toxic appearance. She does not appear ill. No distress.  Afebrile, nontoxic appearing in no acute distress, sitting comfortably in bed.  HENT:  Head: Atraumatic.  Eyes: EOM are normal. No scleral icterus.  Neck: Normal range of motion.  Cardiovascular: Normal rate, regular rhythm and normal heart sounds.  Pulmonary/Chest: Effort normal. No stridor. No respiratory distress. She has no wheezes. She has no rhonchi. She has no rales. She exhibits no tenderness.  Abdominal: Soft. Normal aorta and bowel sounds are normal. She exhibits distension. There is tenderness in the right lower quadrant. There is no rigidity, no guarding and no CVA tenderness.  Abdomen appears distended but is soft.  Tenderness to palpation at McBurney's point.  No tenderness to palpation otherwise.  Musculoskeletal: Normal range of motion.  Neurological: She is alert.  Skin: Skin is warm and dry. No rash  noted. She is not diaphoretic. No cyanosis or erythema. No pallor.  Psychiatric: She has a normal mood and affect. Her behavior is normal.  Nursing note and vitals reviewed.    ED Treatments / Results  Labs (all labs ordered are listed, but only abnormal results are displayed) Labs Reviewed  URINALYSIS, ROUTINE W REFLEX MICROSCOPIC - Abnormal; Notable for the following components:      Result Value   Specific Gravity, Urine <1.005 (*)    Leukocytes, UA MODERATE (*)    All other components within normal limits  CBC WITH DIFFERENTIAL/PLATELET - Abnormal; Notable for the following components:   WBC 17.0 (*)    Neutro Abs 12.8 (*)    Monocytes Absolute 2.0 (*)    All other components within normal limits  URINALYSIS, MICROSCOPIC (REFLEX) - Abnormal; Notable for the following components:   Bacteria, UA RARE (*)    Squamous Epithelial / LPF 0-5 (*)    All other components within normal limits  I-STAT CHEM 8, ED - Abnormal; Notable for the following components:   Glucose, Bld 104 (*)    All other components within normal limits  HEPATIC FUNCTION PANEL  LIPASE, BLOOD    EKG  EKG Interpretation None       Radiology Ct Abdomen Pelvis W Contrast  Result Date: 03/16/2017 CLINICAL DATA:  Worsening right lower quadrant abdominal pain over the past few days. EXAM: CT ABDOMEN AND PELVIS WITH CONTRAST TECHNIQUE: Multidetector CT imaging of the abdomen and pelvis was performed using the standard protocol following bolus administration of intravenous contrast. CONTRAST:  111mL ISOVUE-300 IOPAMIDOL (ISOVUE-300) INJECTION 61% COMPARISON:  CT abdomen pelvis dated November 05, 2005. FINDINGS: Lower chest: No acute abnormality. Hepatobiliary: No focal liver abnormality is seen. No gallstones, gallbladder wall thickening, or biliary dilatation. Pancreas: Slightly prominent main pancreatic duct proximally. Otherwise unremarkable. Spleen: Normal in size without focal abnormality. Adrenals/Urinary  Tract: The adrenal glands are unremarkable. Unchanged punctate calculus in the midpole of the right kidney. Subcentimeter low-density lesion in the right kidney remains too small to characterize, but is stable. No hydronephrosis. The bladder is unremarkable. Stomach/Bowel: Markedly dilated appendix with surrounding inflammatory changes, consistent with acute appendicitis. Appendix: Location: Right lower quadrant. Diameter: 2.2 cm. Appendicolith: 7 mm appendicolith within  the proximal appendix. Mucosal hyper-enhancement: Present. Extraluminal gas: None. Periappendiceal collection: None. The stomach is within normal limits. No bowel obstruction. Extensive left-sided colonic diverticulosis. Vascular/Lymphatic: No significant vascular findings are present. No enlarged abdominal or pelvic lymph nodes. Reproductive: Uterus and bilateral adnexa are unremarkable. Other: Small amount of free fluid in the pelvis. No pneumoperitoneum. Musculoskeletal: No acute or significant osseous findings. Unchanged bilateral L5 pars defects with grade 2 anterolisthesis at L5-S1. IMPRESSION: 1. Acute, uncomplicated appendicitis. 2. Unchanged punctate right nephrolithiasis. 3. Unchanged bilateral L5 pars defects with grade 2 anterolisthesis at L5-S1. Electronically Signed   By: Titus Dubin M.D.   On: 03/16/2017 13:58    Procedures Procedures (including critical care time) CRITICAL CARE Performed by: Emeline General   Total critical care time: 30 minutes  Critical care time was exclusive of separately billable procedures and treating other patients.  Critical care was necessary to treat or prevent imminent or life-threatening deterioration.  Critical care was time spent personally by me on the following activities: development of treatment plan with patient and/or surrogate as well as nursing, discussions with consultants, evaluation of patient's response to treatment, examination of patient, obtaining history from  patient or surrogate, ordering and performing treatments and interventions, ordering and review of laboratory studies, ordering and review of radiographic studies, pulse oximetry and re-evaluation of patient's condition.   Medications Ordered in ED Medications  cefTRIAXone (ROCEPHIN) 2 g in dextrose 5 % 50 mL IVPB (0 g Intravenous Stopped 03/16/17 1504)    And  metroNIDAZOLE (FLAGYL) IVPB 500 mg (500 mg Intravenous New Bag/Given 03/16/17 1507)  fentaNYL (SUBLIMAZE) injection 50 mcg (50 mcg Intravenous Given 03/16/17 1506)  sodium chloride 0.9 % bolus 1,000 mL (0 mLs Intravenous Stopped 03/16/17 1438)  ondansetron (ZOFRAN) injection 4 mg (4 mg Intravenous Given 03/16/17 1304)  morphine 4 MG/ML injection 4 mg (4 mg Intravenous Given 03/16/17 1307)  iopamidol (ISOVUE-300) 61 % injection 100 mL (100 mLs Intravenous Contrast Given 03/16/17 1333)  ondansetron (ZOFRAN) injection 4 mg (4 mg Intravenous Given 03/16/17 1505)     Initial Impression / Assessment and Plan / ED Course  I have reviewed the triage vital signs and the nursing notes.  Pertinent labs & imaging results that were available during my care of the patient were reviewed by me and considered in my medical decision making (see chart for details).    Patient presenting with 2 weeks of intermittent lower abdominal pain and epigastric ache.  Endorses nausea but no vomiting.  She is afebrile and nontoxic appearing.  Tender to palpation at McBurney's point.  History and exam findings concerning for appendicitis.  Ordered CT abdomen pelvis, fluids, analgesia and will reassess.  On reassessment, patient reported improvement.  Pain later returned and improved after analgesia. No nausea/vomiting.  CT abdomen and pelvis with evidence of uncomplicated appendicitis.  Started IV antibiotics in the emergency department. White count 17  Consulted general surgery.  Spoke to Dr. Johney Maine who will be seeing patient at [facility] for surgical evaluation. Patient  will be transferred to [facility].  Accepting Physician Dr. Maryan Rued. On reassessment, patient was comfortable prior to transfer.  Final Clinical Impressions(s) / ED Diagnoses   Final diagnoses:  Acute appendicitis, unspecified acute appendicitis type    ED Discharge Orders    None       Dossie Der 03/16/17 1524    Jola Schmidt, MD 03/16/17 1606    Emeline General, PA-C 03/16/17 1719    Jola Schmidt, MD  03/17/17 1515  

## 2017-03-16 NOTE — ED Notes (Signed)
ED Provider at bedside. 

## 2017-03-16 NOTE — ED Notes (Signed)
Report given to [employer], RN with [employer] 

## 2017-03-16 NOTE — ED Notes (Signed)
All valuables given to husband

## 2017-03-17 ENCOUNTER — Encounter (HOSPITAL_COMMUNITY): Admission: EM | Disposition: A | Discharge status: Home / Self Care | Payer: Self-pay | Source: Home / Self Care

## 2017-03-17 ENCOUNTER — Inpatient Hospital Stay (HOSPITAL_COMMUNITY): Payer: 59 | Admitting: Anesthesiology

## 2017-03-17 ENCOUNTER — Encounter (HOSPITAL_COMMUNITY): Payer: Self-pay

## 2017-03-17 HISTORY — PX: LAPAROSCOPIC APPENDECTOMY: SHX408

## 2017-03-17 LAB — HIV ANTIBODY (ROUTINE TESTING W REFLEX): HIV Screen 4th Generation wRfx: NONREACTIVE

## 2017-03-17 LAB — SURGICAL PCR SCREEN
MRSA, PCR: NEGATIVE
Staphylococcus aureus: NEGATIVE

## 2017-03-17 SURGERY — APPENDECTOMY, LAPAROSCOPIC
Anesthesia: General | Site: Abdomen

## 2017-03-17 MED ORDER — SUGAMMADEX SODIUM 200 MG/2ML IV SOLN
INTRAVENOUS | Status: DC | PRN
  Administered 2017-03-17: 160 mg via INTRAVENOUS

## 2017-03-17 MED ORDER — LACTATED RINGERS IV SOLN
INTRAVENOUS | Status: DC | PRN
  Administered 2017-03-17: 15:00:00 via INTRAVENOUS

## 2017-03-17 MED ORDER — PIPERACILLIN-TAZOBACTAM 3.375 G IVPB
3.3750 g | Freq: Three times a day (TID) | INTRAVENOUS | Status: DC
  Administered 2017-03-18 – 2017-03-19 (×5): 3.375 g via INTRAVENOUS
  Filled 2017-03-17 (×5): qty 50

## 2017-03-17 MED ORDER — LIDOCAINE 2% (20 MG/ML) 5 ML SYRINGE
INTRAMUSCULAR | Status: DC | PRN
  Administered 2017-03-17: 1.5 mg/kg/h via INTRAVENOUS

## 2017-03-17 MED ORDER — ROCURONIUM BROMIDE 10 MG/ML (PF) SYRINGE
PREFILLED_SYRINGE | INTRAVENOUS | Status: DC | PRN
  Administered 2017-03-17: 10 mg via INTRAVENOUS
  Administered 2017-03-17: 40 mg via INTRAVENOUS

## 2017-03-17 MED ORDER — LIDOCAINE 2% (20 MG/ML) 5 ML SYRINGE
INTRAMUSCULAR | Status: AC
  Filled 2017-03-17: qty 10

## 2017-03-17 MED ORDER — ONDANSETRON HCL 4 MG/2ML IJ SOLN
INTRAMUSCULAR | Status: AC
  Filled 2017-03-17: qty 2

## 2017-03-17 MED ORDER — KETAMINE HCL 10 MG/ML IJ SOLN
INTRAMUSCULAR | Status: DC | PRN
  Administered 2017-03-17: 30 mg via INTRAVENOUS

## 2017-03-17 MED ORDER — OXYCODONE HCL 5 MG PO TABS
10.0000 mg | ORAL_TABLET | ORAL | Status: DC | PRN
  Administered 2017-03-17 – 2017-03-19 (×4): 10 mg via ORAL
  Filled 2017-03-17 (×4): qty 2

## 2017-03-17 MED ORDER — FENTANYL CITRATE (PF) 100 MCG/2ML IJ SOLN
INTRAMUSCULAR | Status: DC | PRN
  Administered 2017-03-17: 100 ug via INTRAVENOUS
  Administered 2017-03-17: 50 ug via INTRAVENOUS

## 2017-03-17 MED ORDER — MIDAZOLAM HCL 5 MG/5ML IJ SOLN
INTRAMUSCULAR | Status: DC | PRN
  Administered 2017-03-17: 2 mg via INTRAVENOUS

## 2017-03-17 MED ORDER — ONDANSETRON HCL 4 MG/2ML IJ SOLN: 4.0000 mg | Freq: Once | INTRAMUSCULAR | Status: DC | PRN

## 2017-03-17 MED ORDER — KETAMINE HCL 10 MG/ML IJ SOLN
INTRAMUSCULAR | Status: AC
  Filled 2017-03-17: qty 1

## 2017-03-17 MED ORDER — METRONIDAZOLE IN NACL 5-0.79 MG/ML-% IV SOLN
500.0000 mg | Freq: Three times a day (TID) | INTRAVENOUS | Status: DC
  Administered 2017-03-17 – 2017-03-18 (×3): 500 mg via INTRAVENOUS
  Filled 2017-03-17 (×7): qty 100

## 2017-03-17 MED ORDER — DEXAMETHASONE SODIUM PHOSPHATE 10 MG/ML IJ SOLN
INTRAMUSCULAR | Status: DC | PRN
  Administered 2017-03-17: 10 mg via INTRAVENOUS

## 2017-03-17 MED ORDER — ONDANSETRON HCL 4 MG/2ML IJ SOLN
INTRAMUSCULAR | Status: DC | PRN
  Administered 2017-03-17: 4 mg via INTRAVENOUS

## 2017-03-17 MED ORDER — BUPIVACAINE-EPINEPHRINE 0.25% -1:200000 IJ SOLN
INTRAMUSCULAR | Status: AC
  Filled 2017-03-17: qty 1

## 2017-03-17 MED ORDER — PROPOFOL 10 MG/ML IV BOLUS
INTRAVENOUS | Status: AC
  Filled 2017-03-17: qty 20

## 2017-03-17 MED ORDER — PROPOFOL 10 MG/ML IV BOLUS
INTRAVENOUS | Status: DC | PRN
  Administered 2017-03-17: 140 mg via INTRAVENOUS

## 2017-03-17 MED ORDER — LIDOCAINE 2% (20 MG/ML) 5 ML SYRINGE
INTRAMUSCULAR | Status: DC | PRN
  Administered 2017-03-17: 50 mg via INTRAVENOUS

## 2017-03-17 MED ORDER — FENTANYL CITRATE (PF) 250 MCG/5ML IJ SOLN
INTRAMUSCULAR | Status: AC
  Filled 2017-03-17: qty 5

## 2017-03-17 MED ORDER — ROCURONIUM BROMIDE 10 MG/ML (PF) SYRINGE
PREFILLED_SYRINGE | INTRAVENOUS | Status: AC
  Filled 2017-03-17: qty 5

## 2017-03-17 MED ORDER — DEXAMETHASONE SODIUM PHOSPHATE 10 MG/ML IJ SOLN
INTRAMUSCULAR | Status: AC
  Filled 2017-03-17: qty 1

## 2017-03-17 MED ORDER — FENTANYL CITRATE (PF) 100 MCG/2ML IJ SOLN: 25.0000 ug | INTRAMUSCULAR | Status: DC | PRN

## 2017-03-17 MED ORDER — 0.9 % SODIUM CHLORIDE (POUR BTL) OPTIME
TOPICAL | Status: DC | PRN
  Administered 2017-03-17: 1000 mL

## 2017-03-17 MED ORDER — LACTATED RINGERS IR SOLN
Status: DC | PRN
  Administered 2017-03-17: 1000 mL

## 2017-03-17 MED ORDER — MIDAZOLAM HCL 2 MG/2ML IJ SOLN
INTRAMUSCULAR | Status: AC
  Filled 2017-03-17: qty 2

## 2017-03-17 MED ORDER — BUPIVACAINE-EPINEPHRINE 0.25% -1:200000 IJ SOLN
INTRAMUSCULAR | Status: DC | PRN
  Administered 2017-03-17: 20 mL

## 2017-03-17 MED ORDER — SUGAMMADEX SODIUM 200 MG/2ML IV SOLN
INTRAVENOUS | Status: AC
  Filled 2017-03-17: qty 2

## 2017-03-17 SURGICAL SUPPLY — 57 items
ADH SKN CLS APL DERMABOND .7 (GAUZE/BANDAGES/DRESSINGS) ×1
APPLIER CLIP 5 13 M/L LIGAMAX5 (MISCELLANEOUS)
APPLIER CLIP ROT 10 11.4 M/L (STAPLE)
APR CLP MED LRG 11.4X10 (STAPLE)
APR CLP MED LRG 5 ANG JAW (MISCELLANEOUS)
BAG SPEC RTRVL 10 TROC 200 (ENDOMECHANICALS) ×1
BAG SPEC RTRVL LRG 6X4 10 (ENDOMECHANICALS) ×1
CABLE HIGH FREQUENCY MONO STRZ (ELECTRODE) ×2 IMPLANT
CHLORAPREP W/TINT 26ML (MISCELLANEOUS) ×2 IMPLANT
CLIP APPLIE 5 13 M/L LIGAMAX5 (MISCELLANEOUS) IMPLANT
CLIP APPLIE ROT 10 11.4 M/L (STAPLE) IMPLANT
COVER SURGICAL LIGHT HANDLE (MISCELLANEOUS) ×2 IMPLANT
CUTTER FLEX LINEAR 45M (STAPLE) ×1 IMPLANT
DECANTER SPIKE VIAL GLASS SM (MISCELLANEOUS) ×2 IMPLANT
DERMABOND ADVANCED (GAUZE/BANDAGES/DRESSINGS) ×1
DERMABOND ADVANCED .7 DNX12 (GAUZE/BANDAGES/DRESSINGS) IMPLANT
DEVICE TROCAR PUNCTURE CLOSURE (ENDOMECHANICALS) IMPLANT
DRAPE LAPAROSCOPIC ABDOMINAL (DRAPES) ×2 IMPLANT
DRAPE WARM FLUID 44X44 (DRAPE) ×2 IMPLANT
DRSG TEGADERM 2-3/8X2-3/4 SM (GAUZE/BANDAGES/DRESSINGS) ×4 IMPLANT
DRSG TEGADERM 4X4.75 (GAUZE/BANDAGES/DRESSINGS) ×2 IMPLANT
ELECT REM PT RETURN 15FT ADLT (MISCELLANEOUS) ×2 IMPLANT
ENDOLOOP SUT PDS II  0 18 (SUTURE)
ENDOLOOP SUT PDS II 0 18 (SUTURE) IMPLANT
GAUZE SPONGE 2X2 8PLY STRL LF (GAUZE/BANDAGES/DRESSINGS) ×1 IMPLANT
GLOVE BIO SURGEON STRL SZ7.5 (GLOVE) ×1 IMPLANT
GLOVE BIOGEL PI IND STRL 7.5 (GLOVE) IMPLANT
GLOVE BIOGEL PI INDICATOR 7.5 (GLOVE) ×4
GLOVE SURG SS PI 7.5 STRL IVOR (GLOVE) ×2 IMPLANT
GOWN STRL REUS W/TWL 2XL LVL3 (GOWN DISPOSABLE) ×1 IMPLANT
GOWN STRL REUS W/TWL XL LVL3 (GOWN DISPOSABLE) ×3 IMPLANT
IRRIG SUCT STRYKERFLOW 2 WTIP (MISCELLANEOUS) ×2
IRRIGATION SUCT STRKRFLW 2 WTP (MISCELLANEOUS) ×1 IMPLANT
KIT BASIN OR (CUSTOM PROCEDURE TRAY) ×2 IMPLANT
PAD POSITIONING PINK XL (MISCELLANEOUS) ×2 IMPLANT
POUCH RETRIEVAL ECOSAC 10 (ENDOMECHANICALS) IMPLANT
POUCH RETRIEVAL ECOSAC 10MM (ENDOMECHANICALS) ×1
POUCH SPECIMEN RETRIEVAL 10MM (ENDOMECHANICALS) ×2 IMPLANT
RELOAD 45 THICK GREEN (ENDOMECHANICALS) ×6 IMPLANT
RELOAD 45 VASCULAR/THIN (ENDOMECHANICALS) IMPLANT
RELOAD STAPLE 45 2.5 WHT GRN (ENDOMECHANICALS) IMPLANT
RELOAD STAPLE 45 3.5 BLU ETS (ENDOMECHANICALS) IMPLANT
RELOAD STAPLE 45 GRN THCK ETS (ENDOMECHANICALS) IMPLANT
RELOAD STAPLE TA45 3.5 REG BLU (ENDOMECHANICALS) IMPLANT
SCISSORS METZENBAUM CVD 33 (INSTRUMENTS) ×2 IMPLANT
SHEARS HARMONIC ACE PLUS 36CM (ENDOMECHANICALS) IMPLANT
SLEEVE XCEL OPT CAN 5 100 (ENDOMECHANICALS) ×2 IMPLANT
SPONGE GAUZE 2X2 STER 10/PKG (GAUZE/BANDAGES/DRESSINGS) ×1
SUT MNCRL AB 4-0 PS2 18 (SUTURE) ×2 IMPLANT
SUT SILK 2 0 SH (SUTURE) IMPLANT
SUT VICRYL 0 UR6 27IN ABS (SUTURE) ×1 IMPLANT
TOWEL OR 17X26 10 PK STRL BLUE (TOWEL DISPOSABLE) ×2 IMPLANT
TRAY FOLEY W/METER SILVER 16FR (SET/KITS/TRAYS/PACK) IMPLANT
TRAY LAPAROSCOPIC (CUSTOM PROCEDURE TRAY) ×2 IMPLANT
TROCAR BLADELESS OPT 5 100 (ENDOMECHANICALS) ×2 IMPLANT
TROCAR XCEL BLUNT TIP 100MML (ENDOMECHANICALS) ×1 IMPLANT
TUBING INSUF HEATED (TUBING) ×2 IMPLANT

## 2017-03-17 NOTE — Op Note (Signed)
Preoperative Diagnosis: Acute appendicitis, unspecified acute appendicitis type [K35.80]  Postoprative Diagnosis: Acute appendicitis with perforation  Procedure: Procedure(s): APPENDECTOMY LAPAROSCOPIC   Surgeon: Excell Seltzer T   Assistants: None  Anesthesia:  General endotracheal anesthesia  Indications: Patient is a 57 year old female who presents with 2-3 days of nausea and right lower quadrant abdominal pain.  She has localized tenderness and CT scan has shown evidence of acute appendicitis with fecaliths but no apparent perforation.  We have recommended proceeding with urgent laparoscopic appendectomy.  I discussed the procedure and indications with the patient and her husband.  We discussed expected recovery and potential complications including anesthetic risks, bleeding, infection, visceral injury or possible need for open procedure.  They understand and agree to proceed.    Procedure Detail: Patient was brought to the operating room, placed in the supine position on the operating table, and general endotracheal anesthesia induced.  Foley catheter was placed.  She had received preoperative IV antibiotics.  The abdomen was widely sterilely prepped and draped.  Patient timeout was performed and correct procedure verified.  Access was obtained with a 1/2 cm incision at the umbilicus with an open Hassan technique through a mattress suture of 0 Vicryl and pneumoperitoneum established.  Under direct vision 5 mm trochars were placed in the epigastrium and left lower quadrant.  There was noted to be an inflammatory process in the right lower quadrant with terminal ileum, sigmoid colon and cecum fairly densely adherent to the lateral abdominal wall.  These fairly dense inflammatory adhesions were carefully taken down with blunt and sucker dissection.  The appendix was exposed which was severely inflamed, very dilated and densely adherent to the lateral pelvic sidewall.  Using blunt and subcu  dissection the appendix was able to be freed from the lateral abdominal wall.  At about the midpoint of the appendix I encountered a perforation with several small fecaliths outside the appendix which were suctioned and removed.  The mesoappendix and lateral peritoneal attachments which were very thickened were carefully dissected with harmonic scalpel.  The inflammatory process was somewhat adherent to the terminal ileum near the ileocecal valve but this was able to be separated with careful blunt dissection and the mesoappendix sequentially divided with harmonic scalpel and fatty attachments laterally and around the tip of the cecum bluntly divided as well until I was able to free the appendix onto the tip of the cecum.  I could clearly visualize the ileocecal valve posteriorly.  The appendix was still quite thickened at its base but the tissue was much softer across the tip of the cecum.  I divided the appendix from the tip of the cecum with 2 firings of the 45 mm green load stapler.  The staple line was intact although there may have been some slight splitting of the serosa.  There was good blood supply.  The appendix was placed in an eco-sac and brought out through the umbilical incision after enlarging the fascial incision and skin incision for about half a centimeter size of the appendix.  The abdomen was then thoroughly irrigated with several liters of saline.  Hemostasis was assured.  I then carefully examined the staple line, the cecum the terminal ileum and the sigmoid colon with no evidence of injury or disruption.  CO2 was evacuated and the [facility] trocar removed.  The fascial defect was closed with the previously placed mattress suture and one additional figure-of-eight suture of 0 Vicryl.  I reinsufflated the abdomen to ensure the viscera were caught in  the closure.  There was no bleeding at the surgery site.  All CO2 was evacuated and trochars removed.  Skin incisions were closed with subcuticular 5-0  Monocryl and Dermabond.  Sponge needle and instrument counts were correct.    Findings: Appendicitis with perforation  Estimated Blood Loss:  less than 100 mL         Drains: None  Blood Given: none          Specimens: Appendix        Complications:  * No complications entered in OR log *         Disposition: PACU - hemodynamically stable.         Condition: stable

## 2017-03-17 NOTE — Progress Notes (Signed)
Medications administered by student RN [id number] with supervision of Clinical Instructor Trystan Akhtar MSN, RN-BC or patient's assigned RN.   

## 2017-03-17 NOTE — Anesthesia Preprocedure Evaluation (Signed)
Anesthesia Evaluation  Patient identified by MRN, date of birth, ID band Patient awake    Reviewed: Allergy & Precautions, NPO status , Patient's Chart, lab work & pertinent test results  Airway Mallampati: II  TM Distance: >3 FB Neck ROM: Full    Dental  (+) Dental Advisory Given   Pulmonary COPD, former smoker,    breath sounds clear to auscultation       Cardiovascular hypertension,  Rhythm:Regular Rate:Normal  NM stress test in 2017: negative for ischemia. Echo normal EF. Trace MR   Neuro/Psych Anxiety Depression Bipolar Disorder  Neuromuscular disease    GI/Hepatic Neg liver ROS, GERD  ,Acute appendicitis   Endo/Other  negative endocrine ROS  Renal/GU negative Renal ROS     Musculoskeletal   Abdominal   Peds  Hematology negative hematology ROS (+)   Anesthesia Other Findings   Reproductive/Obstetrics                             Lab Results  Component Value Date   WBC 17.0 (H) 03/16/2017   HGB 12.9 03/16/2017   HCT 38.0 03/16/2017   MCV 88.7 03/16/2017   PLT 269 03/16/2017   Lab Results  Component Value Date   CREATININE 1.00 03/16/2017   BUN 10 03/16/2017   NA 141 03/16/2017   K 3.8 03/16/2017   CL 101 03/16/2017   CO2 29 01/21/2017    Anesthesia Physical Anesthesia Plan  ASA: II and emergent  Anesthesia Plan: General   Post-op Pain Management:    Induction: Intravenous  PONV Risk Score and Plan: 3 and Ondansetron, Dexamethasone and Treatment may vary due to age or medical condition  Airway Management Planned: Oral ETT  Additional Equipment:   Intra-op Plan:   Post-operative Plan: Extubation in OR  Informed Consent: I have reviewed the patients History and Physical, chart, labs and discussed the procedure including the risks, benefits and alternatives for the proposed anesthesia with the patient or authorized representative who has indicated his/her  understanding and acceptance.   Dental advisory given  Plan Discussed with: CRNA  Anesthesia Plan Comments:         Anesthesia Quick Evaluation

## 2017-03-17 NOTE — Progress Notes (Signed)
Patient ID: Beverly Cole, female   DOB: 09/02/1960, 57 y.o.   MRN: [id number] Day of Surgery   Subjective: Right lower quadrant abdominal pain unchanged  Objective: Vital signs in last 24 hours: Temp:  [98.3 F (36.8 C)-100.9 F (38.3 C)] 98.5 F (36.9 C) (02/05 1237) Pulse Rate:  [92-105] 95 (02/05 1017) Resp:  [12-20] 12 (02/05 1017) BP: (105-139)/(46-83) 105/46 (02/05 1017) SpO2:  [88 %-100 %] 93 % (02/05 1017) Weight:  [80.4 kg (177 lb 4 oz)] 80.4 kg (177 lb 4 oz) (02/04 2117) Last BM Date: 03/15/17  Intake/Output from previous day: 02/04 0701 - 02/05 0700 In: 1050 [IV Piggyback:1050] Out: -  Intake/Output this shift: No intake/output data recorded.  General appearance: alert, cooperative and mild distress GI: abnormal findings:  moderate tenderness in the RLQ  Lab Results:  Recent Labs    03/16/17 1247 03/16/17 1257  WBC 17.0*  --   HGB 13.0 12.9  HCT 39.4 38.0  PLT 269  --    BMET Recent Labs    03/16/17 1257  NA 141  K 3.8  CL 101  GLUCOSE 104*  BUN 10  CREATININE 1.00     Studies/Results: Ct Abdomen Pelvis W Contrast  Result Date: 03/16/2017 CLINICAL DATA:  Worsening right lower quadrant abdominal pain over the past few days. EXAM: CT ABDOMEN AND PELVIS WITH CONTRAST TECHNIQUE: Multidetector CT imaging of the abdomen and pelvis was performed using the standard protocol following bolus administration of intravenous contrast. CONTRAST:  162mL ISOVUE-300 IOPAMIDOL (ISOVUE-300) INJECTION 61% COMPARISON:  CT abdomen pelvis dated November 05, 2005. FINDINGS: Lower chest: No acute abnormality. Hepatobiliary: No focal liver abnormality is seen. No gallstones, gallbladder wall thickening, or biliary dilatation. Pancreas: Slightly prominent main pancreatic duct proximally. Otherwise unremarkable. Spleen: Normal in size without focal abnormality. Adrenals/Urinary Tract: The adrenal glands are unremarkable. Unchanged punctate calculus in the midpole of the right  kidney. Subcentimeter low-density lesion in the right kidney remains too small to characterize, but is stable. No hydronephrosis. The bladder is unremarkable. Stomach/Bowel: Markedly dilated appendix with surrounding inflammatory changes, consistent with acute appendicitis. Appendix: Location: Right lower quadrant. Diameter: 2.2 cm. Appendicolith: 7 mm appendicolith within the proximal appendix. Mucosal hyper-enhancement: Present. Extraluminal gas: None. Periappendiceal collection: None. The stomach is within normal limits. No bowel obstruction. Extensive left-sided colonic diverticulosis. Vascular/Lymphatic: No significant vascular findings are present. No enlarged abdominal or pelvic lymph nodes. Reproductive: Uterus and bilateral adnexa are unremarkable. Other: Small amount of free fluid in the pelvis. No pneumoperitoneum. Musculoskeletal: No acute or significant osseous findings. Unchanged bilateral L5 pars defects with grade 2 anterolisthesis at L5-S1. IMPRESSION: 1. Acute, uncomplicated appendicitis. 2. Unchanged punctate right nephrolithiasis. 3. Unchanged bilateral L5 pars defects with grade 2 anterolisthesis at L5-S1. Electronically Signed   By: Titus Dubin M.D.   On: 03/16/2017 13:58    Anti-infectives: Anti-infectives (From admission, onward)   Start     Dose/Rate Route Frequency Ordered Stop   03/17/17 1400  cefTRIAXone (ROCEPHIN) 2 g in dextrose 5 % 50 mL IVPB     2 g 100 mL/hr over 30 Minutes Intravenous Every 24 hours 03/16/17 1809     03/17/17 0600  cefTRIAXone (ROCEPHIN) 2 g in dextrose 5 % 50 mL IVPB  Status:  Discontinued     2 g 100 mL/hr over 30 Minutes Intravenous On call to O.R. 03/16/17 1809 03/16/17 2154   03/16/17 2200  metroNIDAZOLE (FLAGYL) IVPB 500 mg     500 mg 100 mL/hr over 60  Minutes Intravenous Every 8 hours 03/16/17 1809     03/16/17 1809  metroNIDAZOLE (FLAGYL) IVPB 500 mg  Status:  Discontinued     500 mg 100 mL/hr over 60 Minutes Intravenous Every 8 hours  03/16/17 1809 03/16/17 2154   03/16/17 1415  cefTRIAXone (ROCEPHIN) 2 g in dextrose 5 % 50 mL IVPB     2 g 100 mL/hr over 30 Minutes Intravenous  Once 03/16/17 1410 03/16/17 1504   03/16/17 1415  metroNIDAZOLE (FLAGYL) IVPB 500 mg     500 mg 100 mL/hr over 60 Minutes Intravenous  Once 03/16/17 1410 03/16/17 1729      Assessment/Plan: s/p Procedure(s): APPENDECTOMY LAPAROSCOPIC Acute appendicitis.  For laparoscopic appendectomy today.  I discussed the nature of the procedure and expected recovery with the patient and all her questions answered.   LOS: 1 day    Edward Jolly 03/17/2017

## 2017-03-17 NOTE — Anesthesia Postprocedure Evaluation (Signed)
Anesthesia Post Note  Patient: Beverly Cole  Procedure(s) Performed: APPENDECTOMY LAPAROSCOPIC (N/A Abdomen)     Patient location during evaluation: PACU Anesthesia Type: General Level of consciousness: awake and alert Pain management: pain level controlled Vital Signs Assessment: post-procedure vital signs reviewed and stable Respiratory status: spontaneous breathing, nonlabored ventilation, respiratory function stable and patient connected to nasal cannula oxygen Cardiovascular status: blood pressure returned to baseline and stable Postop Assessment: no apparent nausea or vomiting Anesthetic complications: no    Last Vitals:  Vitals:   03/17/17 1800 03/17/17 1818  BP: 130/64 (!) 126/55  Pulse: 93 96  Resp: 13 14  Temp: 37.2 C 36.8 C  SpO2: 94% 94%    Last Pain:  Vitals:   03/17/17 1841  TempSrc:   PainSc: 2                  Tiajuana Amass

## 2017-03-17 NOTE — Transfer of Care (Signed)
Immediate Anesthesia Transfer of Care Note  Patient: Beverly Cole  Procedure(s) Performed: Procedure(s): APPENDECTOMY LAPAROSCOPIC (N/A)  Patient Location: PACU  Anesthesia Type:General  Level of Consciousness:  sedated, patient cooperative and responds to stimulation  Airway & Oxygen Therapy:Patient Spontanous Breathing and Patient connected to face mask oxgen  Post-op Assessment:  Report given to PACU RN and Post -op Vital signs reviewed and stable  Post vital signs:  Reviewed and stable  Last Vitals:  Vitals:   03/17/17 1508 03/17/17 1737  BP:  125/66  Pulse:  (!) 104  Resp:  12  Temp: 37.4 C 37.2 C  SpO2:  67%    Complications: No apparent anesthesia complications'

## 2017-03-17 NOTE — Anesthesia Procedure Notes (Signed)
Procedure Name: Intubation Date/Time: 03/17/2017 3:34 PM Performed by: Anne Fu, CRNA Pre-anesthesia Checklist: Patient identified, Emergency Drugs available, Suction available, Patient being monitored and Timeout performed Patient Re-evaluated:Patient Re-evaluated prior to induction Oxygen Delivery Method: Circle system utilized Preoxygenation: Pre-oxygenation with 100% oxygen Induction Type: IV induction Ventilation: Mask ventilation without difficulty Laryngoscope Size: Mac and 3 Grade View: Grade I Tube type: Oral Tube size: 7.5 mm Number of attempts: 1 Airway Equipment and Method: Video-laryngoscopy and Stylet Placement Confirmation: ETT inserted through vocal cords under direct vision,  positive ETCO2 and breath sounds checked- equal and bilateral Secured at: 21 cm Tube secured with: Tape Dental Injury: Teeth and Oropharynx as per pre-operative assessment  Difficulty Due To: Difficult Airway- due to limited oral opening Comments: Pt noted small chin and limited oral opening initial attempt with video scope with Grade 1 view noted anterior layrnx on assessment passed ETT atraumatically with clear bilateral lung sounds noted post intubation.

## 2017-03-18 ENCOUNTER — Encounter (HOSPITAL_COMMUNITY): Payer: Self-pay | Admitting: General Surgery

## 2017-03-18 LAB — CBC
HCT: 32.9 % — ABNORMAL LOW (ref 36.0–46.0)
Hemoglobin: 10.8 g/dL — ABNORMAL LOW (ref 12.0–15.0)
MCH: 28.6 pg (ref 26.0–34.0)
MCHC: 32.8 g/dL (ref 30.0–36.0)
MCV: 87 fL (ref 78.0–100.0)
Platelets: 218 10*3/uL (ref 150–400)
RBC: 3.78 MIL/uL — ABNORMAL LOW (ref 3.87–5.11)
RDW: 13.8 % (ref 11.5–15.5)
WBC: 12.7 10*3/uL — ABNORMAL HIGH (ref 4.0–10.5)

## 2017-03-18 MED ORDER — POLYETHYLENE GLYCOL 3350 17 G PO PACK
17.0000 g | PACK | Freq: Every day | ORAL | Status: DC
  Administered 2017-03-18: 17 g via ORAL
  Filled 2017-03-18: qty 1

## 2017-03-18 NOTE — Progress Notes (Signed)
[facility] Progress Note  1 Day Post-Op  Subjective: CC: abdominal pain Patient with abdominal pain in the RLQ. Denies n/v. No flatus yet. Tolerating CLD. Patient ambulated in hallway some already. UOP good. VSS.   Objective: Vital signs in last 24 hours: Temp:  [97.6 F (36.4 C)-100.4 F (38 C)] 97.6 F (36.4 C) (02/06 0618) Pulse Rate:  [83-104] 83 (02/06 0618) Resp:  [12-17] 16 (02/06 0618) BP: (105-141)/(46-75) 114/56 (02/06 0618) SpO2:  [92 %-97 %] 93 % (02/06 0618) Last BM Date: 03/15/17  Intake/Output from previous day: 02/05 0701 - 02/06 0700 In: 2166.7 [P.O.:120; I.V.:1796.7; IV Piggyback:250] Out: 1450 [Urine:1400; Blood:50] Intake/Output this shift: No intake/output data recorded.  PE: Gen:  Alert, NAD, pleasant Card:  Regular rate and rhythm, pedal pulses 2+ BL Pulm:  Normal effort, clear to auscultation bilaterally Abd: Soft, appropriately tender, moderately distended, bowel sounds normoactive, no HSM, incisions C/D/I with some mild erythema Skin: warm and dry, no rashes  Psych: A&Ox3   Lab Results:  Recent Labs    03/16/17 1247 03/16/17 1257 03/18/17 0441  WBC 17.0*  --  12.7*  HGB 13.0 12.9 10.8*  HCT 39.4 38.0 32.9*  PLT 269  --  218   BMET Recent Labs    03/16/17 1257  NA 141  K 3.8  CL 101  GLUCOSE 104*  BUN 10  CREATININE 1.00   PT/INR No results for input(s): LABPROT, INR in the last 72 hours. CMP     Component Value Date/Time   NA 141 03/16/2017 1257   NA 142 01/21/2017 1013   NA 143 01/16/2016 1005   K 3.8 03/16/2017 1257   K 3.2 (L) 01/21/2017 1013   K 3.3 (L) 01/16/2016 1005   CL 101 03/16/2017 1257   CL 106 01/21/2017 1013   CO2 29 01/21/2017 1013   CO2 20 (L) 01/16/2016 1005   GLUCOSE 104 (H) 03/16/2017 1257   GLUCOSE 95 01/21/2017 1013   BUN 10 03/16/2017 1257   BUN 9 01/21/2017 1013   BUN 12.6 01/16/2016 1005   CREATININE 1.00 03/16/2017 1257   CREATININE 0.9 01/21/2017 1013   CREATININE 0.9  01/16/2016 1005   CALCIUM 9.2 01/21/2017 1013   CALCIUM 9.5 01/16/2016 1005   PROT 7.5 03/16/2017 1247   PROT 6.9 01/21/2017 1013   PROT 7.1 01/16/2016 1005   ALBUMIN 3.6 03/16/2017 1247   ALBUMIN 3.3 01/21/2017 1013   ALBUMIN 3.6 01/16/2016 1005   AST 23 03/16/2017 1247   AST 21 01/21/2017 1013   AST 16 01/16/2016 1005   ALT 19 03/16/2017 1247   ALT 22 01/21/2017 1013   ALT 24 01/16/2016 1005   ALKPHOS 65 03/16/2017 1247   ALKPHOS 62 01/21/2017 1013   ALKPHOS 81 01/16/2016 1005   BILITOT 0.6 03/16/2017 1247   BILITOT 0.60 01/21/2017 1013   BILITOT 0.59 01/16/2016 1005   GFRNONAA >60 07/21/2016 2257   GFRAA >60 07/21/2016 2257   Lipase     Component Value Date/Time   LIPASE 23 03/16/2017 1247       Studies/Results: Ct Abdomen Pelvis W Contrast  Result Date: 03/16/2017 CLINICAL DATA:  Worsening right lower quadrant abdominal pain over the past few days. EXAM: CT ABDOMEN AND PELVIS WITH CONTRAST TECHNIQUE: Multidetector CT imaging of the abdomen and pelvis was performed using the standard protocol following bolus administration of intravenous contrast. CONTRAST:  179mL ISOVUE-300 IOPAMIDOL (ISOVUE-300) INJECTION 61% COMPARISON:  CT abdomen pelvis dated November 05, 2005. FINDINGS: Lower chest: No  acute abnormality. Hepatobiliary: No focal liver abnormality is seen. No gallstones, gallbladder wall thickening, or biliary dilatation. Pancreas: Slightly prominent main pancreatic duct proximally. Otherwise unremarkable. Spleen: Normal in size without focal abnormality. Adrenals/Urinary Tract: The adrenal glands are unremarkable. Unchanged punctate calculus in the midpole of the right kidney. Subcentimeter low-density lesion in the right kidney remains too small to characterize, but is stable. No hydronephrosis. The bladder is unremarkable. Stomach/Bowel: Markedly dilated appendix with surrounding inflammatory changes, consistent with acute appendicitis. Appendix: Location: Right lower  quadrant. Diameter: 2.2 cm. Appendicolith: 7 mm appendicolith within the proximal appendix. Mucosal hyper-enhancement: Present. Extraluminal gas: None. Periappendiceal collection: None. The stomach is within normal limits. No bowel obstruction. Extensive left-sided colonic diverticulosis. Vascular/Lymphatic: No significant vascular findings are present. No enlarged abdominal or pelvic lymph nodes. Reproductive: Uterus and bilateral adnexa are unremarkable. Other: Small amount of free fluid in the pelvis. No pneumoperitoneum. Musculoskeletal: No acute or significant osseous findings. Unchanged bilateral L5 pars defects with grade 2 anterolisthesis at L5-S1. IMPRESSION: 1. Acute, uncomplicated appendicitis. 2. Unchanged punctate right nephrolithiasis. 3. Unchanged bilateral L5 pars defects with grade 2 anterolisthesis at L5-S1. Electronically Signed   By: Titus Dubin M.D.   On: 03/16/2017 13:58    Anti-infectives: Anti-infectives (From admission, onward)   Start     Dose/Rate Route Frequency Ordered Stop   03/18/17 0000  piperacillin-tazobactam (ZOSYN) IVPB 3.375 g     3.375 g 12.5 mL/hr over 240 Minutes Intravenous Every 8 hours 03/17/17 1836     03/17/17 1500  metroNIDAZOLE (FLAGYL) IVPB 500 mg     500 mg 100 mL/hr over 60 Minutes Intravenous Every 8 hours 03/17/17 1441     03/17/17 1400  cefTRIAXone (ROCEPHIN) 2 g in dextrose 5 % 50 mL IVPB  Status:  Discontinued     2 g 100 mL/hr over 30 Minutes Intravenous Every 24 hours 03/16/17 1809 03/17/17 1836   03/17/17 0600  cefTRIAXone (ROCEPHIN) 2 g in dextrose 5 % 50 mL IVPB  Status:  Discontinued     2 g 100 mL/hr over 30 Minutes Intravenous On call to O.R. 03/16/17 1809 03/16/17 2154   03/16/17 2200  metroNIDAZOLE (FLAGYL) IVPB 500 mg  Status:  Discontinued     500 mg 100 mL/hr over 60 Minutes Intravenous Every 8 hours 03/16/17 1809 03/17/17 1441   03/16/17 1809  metroNIDAZOLE (FLAGYL) IVPB 500 mg  Status:  Discontinued     500 mg 100 mL/hr  over 60 Minutes Intravenous Every 8 hours 03/16/17 1809 03/16/17 2154   03/16/17 1415  cefTRIAXone (ROCEPHIN) 2 g in dextrose 5 % 50 mL IVPB     2 g 100 mL/hr over 30 Minutes Intravenous  Once 03/16/17 1410 03/16/17 1504   03/16/17 1415  metroNIDAZOLE (FLAGYL) IVPB 500 mg     500 mg 100 mL/hr over 60 Minutes Intravenous  Once 03/16/17 1410 03/16/17 1729       Assessment/Plan Hypothyroidism Bipolar 2 disorder History of pulmonary embolus - Dec 2017 Lupus anticoagulant positive Irritable bowel syndrome (IBS) Anxiety and depression  Acute appendicitis S/P laparoscopic appendectomy 03/17/17 Dr. Excell Seltzer - POD#1 - advance to DYS 1 diet - encourage ambulation and OOB - pain control - possible d/c this afternoon on 5d PO abx  FEN: DYS 1, IVF VTE: SCDs, lovenox ID: rocephin/flagyl 2/4 >2/5; Zosyn 2/6 >>   LOS: 2 days    Brigid Re , [facility] 03/18/2017, 8:28 AM Pager: [phone] Consults: [phone] Mon-Fri 7:00 am-4:30 pm Sat-Sun 7:00 am-11:30  am

## 2017-03-18 NOTE — Progress Notes (Signed)
   03/18/17 1600  Clinical Encounter Type  Visited With Health care provider;Patient and family together  Visit Type Initial  Referral From Nurse  Consult/Referral To Chaplain   Responded to a [facility] for information on a HCPA.  Patient said what she wants is a DNR and that she has had a history of suicidal ideations since the age of 41.  DID NOT express any desires about current ideations.  She was very calm in stating this, but was relaxed and non anxious.  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  She shared some about her husband and the wonderful support she gets from him, seems very stable and looking forward to going home.  Will follow as needed. Chaplain Katherene Ponto

## 2017-03-19 MED ORDER — OXYCODONE HCL 10 MG PO TABS: 5.0000 mg | ORAL_TABLET | Freq: Four times a day (QID) | ORAL | 0 refills | Status: DC | PRN

## 2017-03-19 MED ORDER — METHOCARBAMOL 500 MG PO TABS: 500.0000 mg | ORAL_TABLET | Freq: Three times a day (TID) | ORAL | 0 refills | Status: DC | PRN

## 2017-03-19 MED ORDER — AMOXICILLIN-POT CLAVULANATE 875-125 MG PO TABS: 1.0000 | ORAL_TABLET | Freq: Two times a day (BID) | ORAL | 0 refills | Status: AC

## 2017-03-19 MED FILL — METHOCARBAMOL 500 MG TABS: 500 | 10 days supply | Qty: 30 | Fill #0

## 2017-03-19 MED FILL — AMOX TR-K CLV 875-125 MG TA: 875-125 | 5 days supply | Qty: 10 | Fill #0

## 2017-03-19 MED FILL — oxyCODONE HCL 10 MG TABS: 10 | 3 days supply | Qty: 15 | Fill #0

## 2017-03-19 NOTE — Discharge Instructions (Signed)
°  CCS ______[facility] LAPAROSCOPIC SURGERY: POST OP INSTRUCTIONS Always review your discharge instruction sheet given to you by the facility where your surgery was performed. IF YOU HAVE DISABILITY OR FAMILY LEAVE FORMS, YOU MUST BRING THEM TO THE OFFICE FOR PROCESSING.   DO NOT GIVE THEM TO YOUR DOCTOR.  1. A prescription for pain medication may be given to you upon discharge.  Take your pain medication as prescribed, if needed.  If narcotic pain medicine is not needed, then you may take acetaminophen (Tylenol) or ibuprofen (Advil) as needed. 2. Take your usually prescribed medications unless otherwise directed. 3. If you need a refill on your pain medication, please contact your pharmacy.  They will contact our office to request authorization. Prescriptions will not be filled after 5pm or on week-ends. 4. You should follow a light diet the first few days after arrival home, such as soup and crackers, etc.  Be sure to include lots of fluids daily. 5. Most patients will experience some swelling and bruising in the area of the incisions.  Ice packs will help.  Swelling and bruising can take several days to resolve.  6. It is common to experience some constipation if taking pain medication after surgery.  Increasing fluid intake and taking a stool softener (such as Colace) will usually help or prevent this problem from occurring.  A mild laxative (Milk of Magnesia or Miralax) should be taken according to package instructions if there are no bowel movements after 48 hours. 7. Unless discharge instructions indicate otherwise, you may remove your bandages 24-48 hours after surgery, and you may shower at that time.  You may have steri-strips (small skin tapes) in place directly over the incision.  These strips should be left on the skin for 7-10 days.  If your surgeon used skin glue on the incision, you may shower in 24 hours.  The glue will flake off over the next 2-3 weeks.  Any sutures  or staples will be removed at the office during your follow-up visit. 8. ACTIVITIES:  You may resume regular (light) daily activities beginning the next day--such as daily self-care, walking, climbing stairs--gradually increasing activities as tolerated.  You may have sexual intercourse when it is comfortable.  Refrain from any heavy lifting or straining until approved by your doctor. a. You may drive when you are no longer taking prescription pain medication, you can comfortably wear a seatbelt, and you can safely maneuver your car and apply brakes. b. RETURN TO WORK:  __________________________________________________________ 9. You should see your doctor in the office for a follow-up appointment approximately 2-3 weeks after your surgery.  Make sure that you call for this appointment within a day or two after you arrive home to insure a convenient appointment time.  WHEN TO CALL YOUR DOCTOR: 1. Fever over 101.0 2. Inability to urinate 3. Continued bleeding from incision. 4. Increased pain, redness, or drainage from the incision. 5. Increasing abdominal pain  The clinic staff is available to answer your questions during regular business hours.  Please dont hesitate to call and ask to speak to one of the nurses for clinical concerns.  If you have a medical emergency, go to the nearest emergency room or call 911.  A surgeon from [facility] is always on call at the hospital. [address] [place], [place], [place]  [address] ? [address], [place], [place]   [id number] [phone] ? [phone] ? FAX [phone] Web site: [email or web address]

## 2017-03-19 NOTE — Discharge Summary (Signed)
[facility] Discharge Summary   Patient ID: Beverly Cole MRN: [id number] DOB/AGE: [birth year] 57 y.o.  Admit date: 03/16/2017 Discharge date: 03/19/2017  Admitting Diagnosis: Acute appendicitis  Discharge Diagnosis Patient Active Problem List   Diagnosis Date Noted  . Acute perforated appendicitis s/p lap appendectomy 03/17/2017 03/16/2017  . History of pulmonary embolus - Dec 2017 03/16/2017  . Lupus anticoagulant positive 03/16/2017  . Irritable bowel syndrome (IBS) 03/16/2017  . Anxiety and depression 03/16/2017  . Need for shingles vaccine 11/25/2016  . Shortness of breath 04/25/2016  . Chest pain 02/09/2015  . Severe recurrent major depression without psychotic features ([place])   . Goiter, nontoxic, multinodular 12/18/2014  . Tremor 05/09/2012  . SVT (supraventricular tachycardia) ([place]) 12/03/2011  . Hypertension 08/26/2010  . Hyperlipidemia 03/13/2009  . Bipolar 2 disorder ([place]) 03/13/2009  . Hypothyroidism 07/06/2008  . GERD 11/05/2006  . ACNE NEC 11/05/2006  . DEPRESSION 07/23/2006    Consultants None Imaging: No results found.  Procedures Dr. Excell Seltzer (03/17/17) - Laparoscopic Appendectomy  Hospital Course:  Patient is a 57 y/o female who presented to [facility] with abdominal pain.  Workup showed acute appendicitis and patient was transferred to [facility] for surgical evaluation.  Patient was admitted and underwent procedure listed above.  Tolerated procedure well and was transferred to the floor.  Diet was advanced as tolerated.  On POD#2, the patient was voiding well, tolerating diet, ambulating well, pain well controlled, vital signs stable, incisions c/d/i and felt stable for discharge home.  Patient will follow up in our office in 2 weeks and knows to call with questions or concerns. She will call to confirm appointment date/time.    Physical Exam: General:  Alert, NAD, pleasant, comfortable Abd:  Soft, ND, mild tenderness, incisions C/D/I  I have personally  looked this patient up in the [place] Controlled Substance Database and reviewed their medications.  Allergies as of 03/19/2017      Reactions   Cariprazine Other (See Comments)   Patient becomes suicidal when taking this medication.    Lopressor [metoprolol] Other (See Comments)   Hallucinations hair falls out   Nickel Rash      Medication List    STOP taking these medications   dexlansoprazole 60 MG capsule Commonly known as:  DEXILANT   lidocaine 2 % jelly Commonly known as:  XYLOCAINE   lithium carbonate 150 MG capsule   nitrofurantoin (macrocrystal-monohydrate) 100 MG capsule Commonly known as:  MACROBID     TAKE these medications   ALPRAZolam 0.25 MG tablet Commonly known as:  XANAX TAKE 1 TO 2 TABLETS BY MOUTH TWICE DAILY AS NEEDED FOR ANXIETY   amoxicillin-clavulanate 875-125 MG tablet Commonly known as:  AUGMENTIN Take 1 tablet by mouth every 12 (twelve) hours for 5 days.   aspirin EC 325 MG tablet Take 325 mg by mouth daily.   buPROPion 200 MG 12 hr tablet Commonly known as:  WELLBUTRIN SR TAKE 1 TABLET BY MOUTH DAILY IN THE MORNING   CARTIA XT 180 MG 24 hr capsule Generic drug:  diltiazem TAKE 1 CAPSULE BY MOUTH DAILY.   divalproex 500 MG 24 hr tablet Commonly known as:  DEPAKOTE ER Take 1,000 mg by mouth at bedtime.   fluticasone 50 MCG/ACT nasal spray Commonly known as:  FLONASE Place 2 sprays into the nose daily as needed. For allergies   ketoconazole 2 % cream Commonly known as:  NIZORAL Apply 1 application topically daily.   LINZESS PO Take 72 mcg by mouth daily.  What changed:  Another medication with the same name was removed. Continue taking this medication, and follow the directions you see here.   methocarbamol 500 MG tablet Commonly known as:  ROBAXIN Take 1 tablet (500 mg total) by mouth every 8 (eight) hours as needed for muscle spasms.   Oxycodone HCl 10 MG Tabs Take 0.5-1 tablets (5-10 mg total) by mouth every 6 (six) hours as  needed for moderate pain or severe pain (5 mg for moderate pain; 10 mg for severe pain).   pramipexole 0.25 MG tablet Commonly known as:  MIRAPEX Take 0.25 mg by mouth at bedtime. What changed:  Another medication with the same name was removed. Continue taking this medication, and follow the directions you see here.   PROBIOTIC DAILY PO Take 1 tablet by mouth daily.   propranolol 10 MG tablet Commonly known as:  INDERAL Take 20 mg by mouth daily as needed (shakiness). Reported on 05/17/2015   QUEtiapine 300 MG 24 hr tablet Commonly known as:  SEROQUEL XR TAKE 2 TABLETS BY MOUTH DAILY IN THE EVENING        Follow-up Information    [facility] Follow up on 03/31/2017.   Specialty:  General Surgery Why:  your appointment is at 11:45 AM.  Be at the office 30 minutes early.  Bring photo ID and insurance information.   Contact information: [address] [place] [address] [phone]           Signed: Brigid Re, [facility] 03/19/2017, 9:19 AM Pager: [phone] Consults: [phone] Mon-Fri 7:00 am-4:30 pm Sat-Sun 7:00 am-11:30 am

## 2017-03-20 ENCOUNTER — Other Ambulatory Visit: Payer: Self-pay | Admitting: *Deleted

## 2017-03-20 ENCOUNTER — Encounter: Payer: Self-pay | Admitting: *Deleted

## 2017-03-20 MED FILL — traMADol HCL 50 MG TABS: 50 | 4 days supply | Qty: 15 | Fill #0

## 2017-03-20 NOTE — Patient Outreach (Signed)
[place] [facility]) Care Management  03/20/2017  Beverly Cole Sep 02, 1960 [id number]   Subjective: Received voicemail message from Ms. Beverly Cole, states she is returning my call, is having a problem with diarrhea, wanted to verify that she has enough pain medications, and requested call back.  Telephone call to patient's home / mobile number, spoke with patient, and HIPAA verified.  Discussed [facility] Care Management UMR Transition of care follow up, patient voiced understanding, and is in agreement to follow up.   Patient states she is doing ok, other than the diarrhea, discussed symptoms to report to the MD, verbally given contact number for surgeon's office, voiced understanding, and states she will call surgeon's office today to report symptoms, and follow up regarding the pain medication.  Patient states she is able to manage self care and has assistance as needed with activities of daily living / home management.  Patient voices understanding of medical diagnosis, surgery, and treatment plan. States she is accessing the following [facility] benefits: outpatient pharmacy, hospital indemnity (not sure if chosen, will ask her husband who is the [employer] Employee to follow up, will file claim if appropriate), husband has a flexible work schedule, and no family medical leave act (FMLA) at this time.    Patient states she does not have any education material, transition of care, care coordination, disease management, disease monitoring, transportation, community resource, or pharmacy needs at this time.  States she is very appreciative of the follow up and is in agreement to receive [employer] Management information.      Objective: Per KPN (Knowledge Performance Now, point of care tool) and chart review, patient hospitalized 03/16/17 -03/19/17 for Acute appendicitis with perforation.      Status post APPENDECTOMY LAPAROSCOPIC on 03/17/17.   Patient also has a history of pulmonary embolus - Dec 2017, Lupus  anticoagulant positive, Bipolar 2 disorder , Severe recurrent major depression without psychotic features, Goiter (nontoxic, multinodular ), hypothyroidism, Hyperlipidemia, and hypertension.     Assessment: Received UMR Transition of care referral on 03/17/17.   Transition of care follow up completed, no care management needs, and will proceed with case closure.      Plan: RNCM will send patient successful outreach letter, [facility] pamphlet, and magnet. RNCM will send case closure due to follow up completed / no care management needs request to [facility].      Beverly Cole. Annia Friendly, BSN, [place] Management [facility] Telephonic CM Phone: [phone] Fax: [phone]

## 2017-03-20 NOTE — Patient Outreach (Signed)
[place] [facility]) Care Management  03/20/2017  Beverly Cole May 19, 1960 [id number]   Subjective: Telephone call to patient's home  / mobile number, no answer, left HIPAA compliant voicemail message, and requested call back.    Objective: Per KPN (Knowledge Performance Now, point of care tool) and chart review, patient hospitalized 03/16/17 -03/19/17 for Acute appendicitis with perforation.      Status post APPENDECTOMY LAPAROSCOPIC on 03/17/17.   Patient also has a history of pulmonary embolus - Dec 2017, Lupus anticoagulant positive, Bipolar 2 disorder , Severe recurrent major depression without psychotic features, Goiter (nontoxic, multinodular ), hypothyroidism, Hyperlipidemia, and hypertension.     Assessment: Received UMR Transition of care referral on 03/17/17.   Transition of care follow up pending patient contact.      Plan: RNCM will call patient for 2nd telephone outreach attempt, transition of care follow up, within 10 business days if no return call.      Yohance Hathorne H. Annia Friendly, BSN, [place] Management [facility] Telephonic CM Phone: [phone] Fax: [phone]

## 2017-03-23 ENCOUNTER — Ambulatory Visit: Payer: Self-pay | Admitting: *Deleted

## 2017-03-24 DIAGNOSIS — Z9049 Acquired absence of other specified parts of digestive tract: Secondary | ICD-10-CM | POA: Diagnosis not present

## 2017-04-14 DIAGNOSIS — K5730 Diverticulosis of large intestine without perforation or abscess without bleeding: Secondary | ICD-10-CM | POA: Diagnosis not present

## 2017-04-14 DIAGNOSIS — K219 Gastro-esophageal reflux disease without esophagitis: Secondary | ICD-10-CM | POA: Diagnosis not present

## 2017-04-14 DIAGNOSIS — Z800 Family history of malignant neoplasm of digestive organs: Secondary | ICD-10-CM | POA: Diagnosis not present

## 2017-04-14 DIAGNOSIS — K5900 Constipation, unspecified: Secondary | ICD-10-CM | POA: Diagnosis not present

## 2017-04-16 MED FILL — DEXILANT DR 60 MG CAPSULE: 60 | 30 days supply | Qty: 30 | Fill #0

## 2017-04-17 MED FILL — DIVALPROEX SOD ER 500 MG TA: 500 | 90 days supply | Qty: 180 | Fill #0

## 2017-04-21 ENCOUNTER — Ambulatory Visit (INDEPENDENT_AMBULATORY_CARE_PROVIDER_SITE_OTHER): Payer: 59 | Admitting: Psychology

## 2017-04-21 DIAGNOSIS — F332 Major depressive disorder, recurrent severe without psychotic features: Secondary | ICD-10-CM | POA: Diagnosis not present

## 2017-05-06 MED FILL — QUETIAPINE ER 300 MG TABLET: 300 | 90 days supply | Qty: 180 | Fill #0

## 2017-05-14 ENCOUNTER — Ambulatory Visit (INDEPENDENT_AMBULATORY_CARE_PROVIDER_SITE_OTHER): Payer: 59 | Admitting: Psychology

## 2017-05-14 DIAGNOSIS — F331 Major depressive disorder, recurrent, moderate: Secondary | ICD-10-CM

## 2017-05-20 ENCOUNTER — Other Ambulatory Visit: Payer: Self-pay

## 2017-05-20 ENCOUNTER — Ambulatory Visit: Payer: Self-pay | Admitting: Family

## 2017-05-22 MED FILL — KETOCONAZOLE 2% CREAM: 2 | 30 days supply | Qty: 60 | Fill #1

## 2017-05-26 ENCOUNTER — Encounter: Payer: Self-pay | Admitting: Family Medicine

## 2017-05-26 ENCOUNTER — Ambulatory Visit (INDEPENDENT_AMBULATORY_CARE_PROVIDER_SITE_OTHER): Payer: 59 | Admitting: Family Medicine

## 2017-05-26 VITALS — BP 136/80 | HR 68 | Temp 98.0°F | Resp 16 | Ht 69.0 in | Wt 174.0 lb

## 2017-05-26 DIAGNOSIS — E785 Hyperlipidemia, unspecified: Secondary | ICD-10-CM

## 2017-05-26 DIAGNOSIS — I10 Essential (primary) hypertension: Secondary | ICD-10-CM | POA: Diagnosis not present

## 2017-05-26 NOTE — Progress Notes (Signed)
Patient ID: Beverly Cole, female   DOB: 03-08-1960, 57 y.o.   MRN: [id number]     Subjective:  I acted as a Education administrator for Dr. Carollee Herter.  Guerry Bruin, [place]   Patient ID: Beverly Cole, female    DOB: November 30, 1960, 57 y.o.   MRN: [id number]  Chief Complaint  Patient presents with  . Hypertension  . Hyperlipidemia    HPI  Patient is in today for follow up blood pressure and cholesterol.  Patient Care Team: Carollee Herter, Alferd Apa, DO as PCP - General (Family Medicine) Kem Boroughs, [place] as Nurse Practitioner (Nurse Practitioner) Kathrynn Ducking, MD as Consulting Physician (Neurology) Melida Quitter, MD as Consulting Physician Hollice Espy Phylis Bougie, CNM as Referring Physician (Certified Nurse Midwife) Juanita Craver, MD as Consulting Physician (Gastroenterology) Marin Olp Rudell Cobb, MD as Consulting Physician (Oncology) Cottle, Billey Co., MD as Attending Physician (Psychiatry) Edilia Bo, Candida Peeling, LCSW (Inactive) as Social Worker (Licensed Clinical Social Worker) Burnell Blanks, MD as Consulting Physician (Cardiology) Sharol Roussel, [place] as Physician Assistant (Optometry) Meylor, Marlou Sa, [place] (Chiropractic Medicine) Kem Boroughs, FNP as Nurse Practitioner (Family Medicine)   Past Medical History:  Diagnosis Date  . Anxiety   . Atrial fibrillation ([place])    a. Event monitor 2013 - PACs/bradycardia/SVT/short run of atrial fib by event monitor.   Marland Kitchen BIPOLAR DISORDER UNSPECIFIED   . Bradycardia   . Bursitis of hip 09/1999   Bilateral - Dr. Berenice Primas  . DEPRESSION   . Emphysema lung ([place]) 06/20/2016   pt states pulmonalongist stated started recently  . GERD   . Headache(784.0)    was with menstrual cycle. no longer a problem  . HYPERLIPIDEMIA   . Hypertension   . Hypertension   . IRRITABLE BOWEL SYNDROME, HX OF   . Menopausal state 01/2012   FSH = 88.5  . Mitral valve prolapse 12/17/2000   a. dx 1980s, most recent echo did not demonstrate this.  Marland Kitchen MYALGIA   . Normal coronary arteries      a. by cardiac CT 2013.  Marland Kitchen PAT (paroxysmal atrial tachycardia) ([place])   . PE (pulmonary embolism) 02/09/2015   a. Bilateral PEs 01/2015 when d-dimer 0.69, CP lying on left side. Followed by heme-onc - + lupus anticoagulant, mildly depressed protein S. Dr. Marin Olp is not certain if her hypercoagulable studies are significant for a thrombophilic state.  . Premature atrial contractions   . Pulmonary embolus ([place]) 02/10/2015  . Shortness of breath    Pulmonary eval 05/2002  . Thyroid nodule 2014  . Tremors of nervous system     Past Surgical History:  Procedure Laterality Date  . COLONOSCOPY    . LAPAROSCOPIC APPENDECTOMY N/A 03/17/2017   Procedure: APPENDECTOMY LAPAROSCOPIC;  Surgeon: Excell Seltzer, MD;  Location: [facility] ORS;  Service: General;  Laterality: N/A;  . Lipoma (R) side  1990   Right back    Family History  Problem Relation Age of Onset  . Lung cancer Mother   . Hypertension Mother   . Hyperlipidemia Mother   . Cancer Father        Esophageal cancer  . Hyperlipidemia Father   . Depression Father   . Cancer Brother   . Pulmonary embolism Brother   . Colon cancer Paternal Uncle   . Sarcoidosis Other   . Testicular cancer Other     Social History   Socioeconomic History  . Marital status: Married    Spouse name: Not on file  . Number of children: 0  .  Years of education: Not on file  . Highest education level: Not on file  Occupational History  . Occupation: Nurse-Personal Care/HH    Employer: UNEMPLOYED  Social Needs  . Financial resource strain: Not on file  . Food insecurity:    Worry: Not on file    Inability: Not on file  . Transportation needs:    Medical: Not on file    Non-medical: Not on file  Tobacco Use  . Smoking status: Former Smoker    Packs/day: 1.00    Years: 25.00    Pack years: 25.00    Types: Cigarettes    Last attempt to quit: 02/11/1996    Years since quitting: 21.3  . Smokeless tobacco: Never Used  . Tobacco comment: Married,  lives with spouse. 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 Alcohol use: Yes    Comment: occ  . Drug use: No  . Sexual activity: Yes    Partners: Male    Birth control/protection: Post-menopausal  Lifestyle  . Physical activity:    Days per week: Not on file    Minutes per session: Not on file  . Stress: Not on file  Relationships  . Social connections:    Talks on phone: Not on file    Gets together: Not on file    Attends religious service: Not on file    Active member of club or organization: Not on file    Attends meetings of clubs or organizations: Not on file    Relationship status: Not on file  . Intimate partner violence:    Fear of current or ex partner: Not on file    Emotionally abused: Not on file    Physically abused: Not on file    Forced sexual activity: Not on file  Other Topics Concern  . Not on file  Social History Narrative   Married, lives with spouse in [place]. 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 ALPRAZolam (XANAX) 0.25 MG tablet TAKE 1 TO 2 TABLETS BY MOUTH TWICE DAILY AS NEEDED FOR ANXIETY  1  . aspirin EC 325 MG tablet Take 325 mg by mouth daily.    Marland Kitchen buPROPion (WELLBUTRIN SR) 200 MG 12 hr tablet TAKE 1 TABLET BY MOUTH DAILY IN THE MORNING  0  . CARTIA XT 180 MG 24 hr capsule TAKE 1 CAPSULE BY MOUTH DAILY. 90 capsule 1  . divalproex (DEPAKOTE ER) 500 MG 24 hr tablet Take 1,000 mg by mouth at bedtime.  0  . fluticasone (FLONASE) 50 MCG/ACT nasal spray Place 2 sprays into the nose daily as needed. For allergies    . ketoconazole (NIZORAL) 2 % cream Apply 1 application topically daily. 60 g 3  . Linaclotide (LINZESS PO) Take 72 mcg by mouth daily.     . pramipexole (MIRAPEX) 0.25 MG tablet Take 0.25 mg by mouth at bedtime.   1  . Probiotic Product (PROBIOTIC DAILY PO) Take 1  tablet by mouth daily.     . propranolol (INDERAL) 10 MG tablet Take 20 mg by mouth daily as needed (shakiness). Reported on 05/17/2015  1  . QUEtiapine (SEROQUEL XR) 300 MG 24 hr tablet TAKE 2 TABLETS BY MOUTH DAILY IN THE EVENING  0  . methocarbamol (ROBAXIN) 500 MG tablet  Take 1 tablet (500 mg total) by mouth every 8 (eight) hours as needed for muscle spasms. 30 tablet 0  . oxyCODONE 10 MG TABS Take 0.5-1 tablets (5-10 mg total) by mouth every 6 (six) hours as needed for moderate pain or severe pain (5 mg for moderate pain; 10 mg for severe pain). 15 tablet 0   No facility-administered medications prior to visit.     Allergies  Allergen Reactions  . Cariprazine Other (See Comments)    Patient becomes suicidal when taking this medication.   . Lopressor [Metoprolol] Other (See Comments)    Hallucinations hair falls out  . Nickel Rash    Review of Systems  Constitutional: Negative for fever and malaise/fatigue.  HENT: Negative for congestion.   Eyes: Negative for blurred vision.  Respiratory: Negative for cough and shortness of breath.   Cardiovascular: Negative for chest pain, palpitations and leg swelling.  Gastrointestinal: Negative for vomiting.  Musculoskeletal: Negative for back pain.  Skin: Negative for rash.  Neurological: Negative for loss of consciousness and headaches.       Objective:    Physical Exam  Constitutional: She is oriented to person, place, and time. She appears well-developed and well-nourished.  HENT:  Head: Normocephalic and atraumatic.  Eyes: Conjunctivae and EOM are normal.  Neck: Normal range of motion. Neck supple. No JVD present. Carotid bruit is not present. No thyromegaly present.  Cardiovascular: Normal rate, regular rhythm and normal heart sounds.  No murmur heard. Pulmonary/Chest: Effort normal and breath sounds normal. No respiratory distress. She has no wheezes. She has no rales. She exhibits no tenderness.  Musculoskeletal: She exhibits  no edema.  Neurological: She is alert and oriented to person, place, and time.  Psychiatric: She has a normal mood and affect.  Nursing note and vitals reviewed.   BP 136/80 (BP Location: Left Arm, Cuff Size: Normal)   Pulse 68   Temp 98 F (36.7 C) (Oral)   Resp 16   Ht '5\' 9"'  (1.753 m)   Wt 174 lb (78.9 kg)   LMP 05/11/2012   SpO2 98%   BMI 25.70 kg/m  Wt Readings from Last 3 Encounters:  05/26/17 174 lb (78.9 kg)  03/16/17 177 lb 4 oz (80.4 kg)  11/25/16 169 lb (76.7 kg)   BP Readings from Last 3 Encounters:  05/26/17 136/80  03/19/17 125/62  11/25/16 120/80     Immunization History  Administered Date(s) Administered  . Influenza Split 03/04/2011  . Influenza Whole 02/06/2012, 11/01/2012  . Influenza,inj,Quad PF,6+ Mos 10/31/2014, 11/13/2015, 11/26/2016  . Influenza-Unspecified 10/27/2013  . PPD Test 03/23/2012, 04/05/2013, 05/16/2014  . Td 05/31/2002  . Tdap 05/13/2011  . Zoster Recombinat (Shingrix) 11/25/2016, 01/16/2017    Health Maintenance  Topic Date Due  . INFLUENZA VACCINE  09/10/2017  . MAMMOGRAM  11/13/2018  . PAP SMEAR  06/18/2019  . TETANUS/TDAP  05/12/2021  . COLONOSCOPY  11/27/2025  . Hepatitis C Screening  Completed  . HIV Screening  Completed    Lab Results  Component Value Date   WBC 12.7 (H) 03/18/2017   HGB 10.8 (L) 03/18/2017   HCT 32.9 (L) 03/18/2017   PLT 218 03/18/2017   GLUCOSE 104 (H) 03/16/2017   CHOL 260 (H) 11/25/2016   TRIG 215.0 (H) 11/25/2016   HDL 48.10 11/25/2016   LDLDIRECT 184.0 11/25/2016   LDLCALC 152 (H) 11/13/2015   ALT 19 03/16/2017   AST 23 03/16/2017   NA 141 03/16/2017   K 3.8 03/16/2017  CL 101 03/16/2017   CREATININE 1.00 03/16/2017   BUN 10 03/16/2017   CO2 29 01/21/2017   TSH 4.00 11/25/2016   INR 1.10 02/09/2015   HGBA1C 5.3 03/20/2009   MICROALBUR 0.6 05/19/2011    Lab Results  Component Value Date   TSH 4.00 11/25/2016   Lab Results  Component Value Date   WBC 12.7 (H) 03/18/2017    HGB 10.8 (L) 03/18/2017   HCT 32.9 (L) 03/18/2017   MCV 87.0 03/18/2017   PLT 218 03/18/2017   Lab Results  Component Value Date   NA 141 03/16/2017   K 3.8 03/16/2017   CHLORIDE 110 (H) 01/16/2016   CO2 29 01/21/2017   GLUCOSE 104 (H) 03/16/2017   BUN 10 03/16/2017   CREATININE 1.00 03/16/2017   BILITOT 0.6 03/16/2017   ALKPHOS 65 03/16/2017   AST 23 03/16/2017   ALT 19 03/16/2017   PROT 7.5 03/16/2017   ALBUMIN 3.6 03/16/2017   CALCIUM 9.2 01/21/2017   ANIONGAP 10 07/21/2016   EGFR 75 (L) 01/16/2016   GFR 66.87 11/25/2016   Lab Results  Component Value Date   CHOL 260 (H) 11/25/2016   Lab Results  Component Value Date   HDL 48.10 11/25/2016   Lab Results  Component Value Date   LDLCALC 152 (H) 11/13/2015   Lab Results  Component Value Date   TRIG 215.0 (H) 11/25/2016   Lab Results  Component Value Date   CHOLHDL 5 11/25/2016   Lab Results  Component Value Date   HGBA1C 5.3 03/20/2009         Assessment & Plan:   Problem List Items Addressed This Visit      Unprioritized   Hyperlipidemia LDL goal <100 - Primary    Tolerating statin, encouraged heart healthy diet, avoid trans fats, minimize simple carbs and saturated fats. Increase exercise as tolerated      Relevant Orders   Comprehensive metabolic panel   Lipid panel   Hypertension    Well controlled, no changes to meds. Encouraged heart healthy diet such as the DASH diet and exercise as tolerated.          I have discontinued Marlowe Sax. Boateng's Oxycodone HCl and methocarbamol. I am also having her maintain her fluticasone, Probiotic Product (PROBIOTIC DAILY PO), propranolol, CARTIA XT, ketoconazole, Linaclotide (LINZESS PO), buPROPion, ALPRAZolam, divalproex, pramipexole, QUEtiapine, and aspirin EC.  No orders of the defined types were placed in this encounter.   CMA served as Education administrator during this visit. History, Physical and Plan performed by medical provider. Documentation and orders  reviewed and attested to.  Ann Held, DO

## 2017-05-26 NOTE — Assessment & Plan Note (Signed)
Tolerating statin, encouraged heart healthy diet, avoid trans fats, minimize simple carbs and saturated fats. Increase exercise as tolerated 

## 2017-05-26 NOTE — Assessment & Plan Note (Signed)
Well controlled, no changes to meds. Encouraged heart healthy diet such as the DASH diet and exercise as tolerated.  °

## 2017-05-26 NOTE — Patient Instructions (Signed)

## 2017-05-27 ENCOUNTER — Other Ambulatory Visit: Payer: Self-pay | Admitting: Family Medicine

## 2017-05-27 DIAGNOSIS — I10 Essential (primary) hypertension: Secondary | ICD-10-CM

## 2017-05-27 LAB — COMPREHENSIVE METABOLIC PANEL
ALT: 25 U/L (ref 0–35)
AST: 23 U/L (ref 0–37)
Albumin: 4.2 g/dL (ref 3.5–5.2)
Alkaline Phosphatase: 54 U/L (ref 39–117)
BUN: 10 mg/dL (ref 6–23)
CO2: 28 mEq/L (ref 19–32)
Calcium: 9.7 mg/dL (ref 8.4–10.5)
Chloride: 105 mEq/L (ref 96–112)
Creatinine, Ser: 0.93 mg/dL (ref 0.40–1.20)
GFR: 65.93 mL/min (ref >60.00)
Glucose, Bld: 73 mg/dL (ref 70–99)
Potassium: 3.9 mEq/L (ref 3.5–5.1)
Sodium: 144 mEq/L (ref 135–145)
Total Bilirubin: 0.3 mg/dL (ref 0.2–1.2)
Total Protein: 7.1 g/dL (ref 6.0–8.3)

## 2017-05-27 LAB — LIPID PANEL
Cholesterol: 259 mg/dL — ABNORMAL HIGH (ref 0–200)
HDL: 50.5 mg/dL (ref >39.00)
NonHDL: 208.14
Total CHOL/HDL Ratio: 5
Triglycerides: 295 mg/dL — ABNORMAL HIGH (ref 0.0–149.0)
VLDL: 59 mg/dL — ABNORMAL HIGH (ref 0.0–40.0)

## 2017-05-27 LAB — LDL CHOLESTEROL, DIRECT: Direct LDL: 186 mg/dL

## 2017-05-27 MED FILL — LINZESS 72 MCG CAPSULE: 72 | 30 days supply | Qty: 30 | Fill #1

## 2017-05-27 MED FILL — DEXILANT DR 60 MG CAPSULE: 60 | 30 days supply | Qty: 30 | Fill #1

## 2017-05-27 MED FILL — CARTIA XT 180 MG CAPSULE SA: 180 | 90 days supply | Qty: 90 | Fill #0

## 2017-06-02 MED ORDER — SIMVASTATIN 20 MG PO TABS: 20.0000 mg | ORAL_TABLET | Freq: Every day | ORAL | 2 refills | Status: DC

## 2017-06-02 NOTE — Addendum Note (Signed)
Addended by: Bartholome Bill on: 06/02/2017 11:11 AM   Modules accepted: Orders

## 2017-06-03 DIAGNOSIS — F3181 Bipolar II disorder: Secondary | ICD-10-CM | POA: Diagnosis not present

## 2017-06-04 ENCOUNTER — Inpatient Hospital Stay (HOSPITAL_BASED_OUTPATIENT_CLINIC_OR_DEPARTMENT_OTHER): Payer: 59 | Admitting: Family

## 2017-06-04 ENCOUNTER — Inpatient Hospital Stay: Payer: 59 | Attending: Hematology & Oncology

## 2017-06-04 VITALS — BP 125/83 | HR 87 | Resp 17 | Wt 171.0 lb

## 2017-06-04 DIAGNOSIS — Z7982 Long term (current) use of aspirin: Secondary | ICD-10-CM

## 2017-06-04 DIAGNOSIS — I2602 Saddle embolus of pulmonary artery with acute cor pulmonale: Secondary | ICD-10-CM

## 2017-06-04 DIAGNOSIS — Z86711 Personal history of pulmonary embolism: Secondary | ICD-10-CM | POA: Insufficient documentation

## 2017-06-04 DIAGNOSIS — D6862 Lupus anticoagulant syndrome: Secondary | ICD-10-CM | POA: Diagnosis not present

## 2017-06-04 DIAGNOSIS — I2782 Chronic pulmonary embolism: Secondary | ICD-10-CM

## 2017-06-04 DIAGNOSIS — Z79899 Other long term (current) drug therapy: Secondary | ICD-10-CM | POA: Insufficient documentation

## 2017-06-04 LAB — CMP (CANCER CENTER ONLY)
ALT: 21 U/L (ref 0–55)
AST: 21 U/L (ref 5–34)
Albumin: 4.1 g/dL (ref 3.5–5.0)
Alkaline Phosphatase: 61 U/L (ref 40–150)
Anion gap: 10 (ref 3–11)
BUN: 10 mg/dL (ref 7–26)
CO2: 23 mmol/L (ref 22–29)
Calcium: 9.9 mg/dL (ref 8.4–10.4)
Chloride: 109 mmol/L (ref 98–109)
Creatinine: 0.86 mg/dL (ref 0.60–1.10)
GFR, Est AFR Am: >60 mL/min (ref >60)
GFR, Estimated: >60 mL/min (ref >60)
Glucose, Bld: 92 mg/dL (ref 70–140)
Potassium: 3.8 mmol/L (ref 3.5–5.1)
Sodium: 142 mmol/L (ref 136–145)
Total Bilirubin: 0.3 mg/dL (ref 0.2–1.2)
Total Protein: 7.5 g/dL (ref 6.4–8.3)

## 2017-06-04 LAB — CBC WITH DIFFERENTIAL (CANCER CENTER ONLY)
Basophils Absolute: 0 10*3/uL (ref 0.0–0.1)
Basophils Relative: 1 %
Eosinophils Absolute: 0 10*3/uL (ref 0.0–0.5)
Eosinophils Relative: 1 %
HCT: 40.1 % (ref 34.8–46.6)
Hemoglobin: 13.6 g/dL (ref 11.6–15.9)
Lymphocytes Relative: 41 %
Lymphs Abs: 2.2 10*3/uL (ref 0.9–3.3)
MCH: 29.3 pg (ref 26.0–34.0)
MCHC: 33.9 g/dL (ref 32.0–36.0)
MCV: 86.4 fL (ref 81.0–101.0)
Monocytes Absolute: 0.4 10*3/uL (ref 0.1–0.9)
Monocytes Relative: 7 %
Neutro Abs: 2.7 10*3/uL (ref 1.5–6.5)
Neutrophils Relative %: 50 %
Platelet Count: 214 10*3/uL (ref 145–400)
RBC: 4.64 MIL/uL (ref 3.70–5.32)
RDW: 14.9 % (ref 11.1–15.7)
WBC Count: 5.4 10*3/uL (ref 3.9–10.0)

## 2017-06-04 MED FILL — OLANZapine 15 MG TABS: 15 | 30 days supply | Qty: 30 | Fill #0

## 2017-06-04 MED FILL — BUPROPION HCL SR 200 MG TAB: 200 | 90 days supply | Qty: 90 | Fill #0

## 2017-06-04 MED FILL — SERTRALINE HCL 50 MG TABLET: 50 | 30 days supply | Qty: 30 | Fill #0

## 2017-06-04 NOTE — Progress Notes (Signed)
Hematology and Oncology Follow Up Visit  Beverly Cole [id number] May 02, 1960 57 y.o. 06/04/2017   Principle Diagnosis:  Bilateral pulmonary embolism Positive lupus anticoagulant Mildly depressed protein S level  Past Therapy: Xarelto 20 mg by mouth daily-finish 1 year of therapy in December 2017 Xarelto 10 mg by mouth daily-to complete 1 year in December 2018 - d/c on 01/21/2017  Current Therapy:   EC ASA 325 mg po q day - started on 01/21/2017   Interim History:  Beverly Cole is here today for follow-up. She is doing well and has no complaints at this time.  She is taking her full dose aspirin as prescribed.  She has had no episodes of bleeding, no bruising or petechiae.  She has SOB with exertion related to emphysema. She states that this is stable.  No fever, chills, n/v, cough, rash, dizziness, chest pain, palpitations, abdominal pain or changes in bowel or bladder habits.  No lymphadenopathy found on exam.  Her appendix ruptured in February had to have surgery. She has recuperated nicely.  No swelling, tenderness, numbness or tingling in her extremities. No c/o pain.  She has a good appetite and is starting Nutrisystem along with her husband. She admits that she needs to better hydrate. Her weight is stable.   ECOG Performance Status: 0 - Asymptomatic  Medications:  Allergies as of 06/04/2017      Reactions   Cariprazine Other (See Comments)   Patient becomes suicidal when taking this medication.    Lopressor [metoprolol] Other (See Comments)   Hallucinations hair falls out   Nickel Rash      Medication List        Accurate as of 06/04/17  2:20 PM. Always use your most recent med list.          ALPRAZolam 0.25 MG tablet Commonly known as:  XANAX TAKE 1 TO 2 TABLETS BY MOUTH TWICE DAILY AS NEEDED FOR ANXIETY   aspirin EC 325 MG tablet Take 325 mg by mouth daily.   buPROPion 200 MG 12 hr tablet Commonly known as:  WELLBUTRIN SR TAKE 1 TABLET BY MOUTH DAILY IN  THE MORNING   CARTIA XT 180 MG 24 hr capsule Generic drug:  diltiazem TAKE 1 CAPSULE BY MOUTH DAILY.   divalproex 500 MG 24 hr tablet Commonly known as:  DEPAKOTE ER Take 1,000 mg by mouth at bedtime.   fluticasone 50 MCG/ACT nasal spray Commonly known as:  FLONASE Place 2 sprays into the nose daily as needed. For allergies   ketoconazole 2 % cream Commonly known as:  NIZORAL Apply 1 application topically daily.   LINZESS PO Take 72 mcg by mouth daily.   pramipexole 0.25 MG tablet Commonly known as:  MIRAPEX Take 0.25 mg by mouth at bedtime.   PROBIOTIC DAILY PO Take 1 tablet by mouth daily.   propranolol 10 MG tablet Commonly known as:  INDERAL Take 20 mg by mouth daily as needed (shakiness). Reported on 05/17/2015   QUEtiapine 300 MG 24 hr tablet Commonly known as:  SEROQUEL XR TAKE 2 TABLETS BY MOUTH DAILY IN THE EVENING   simvastatin 20 MG tablet Commonly known as:  ZOCOR Take 1 tablet (20 mg total) by mouth at bedtime.       Allergies:  Allergies  Allergen Reactions  . Cariprazine Other (See Comments)    Patient becomes suicidal when taking this medication.   . Lopressor [Metoprolol] Other (See Comments)    Hallucinations hair falls out  . Nickel Rash  Past Medical History, Surgical history, Social history, and Family History were reviewed and updated.  Review of Systems: All other 10 point review of systems is negative.   Physical Exam:  vitals were not taken for this visit.   Wt Readings from Last 3 Encounters:  05/26/17 174 lb (78.9 kg)  03/16/17 177 lb 4 oz (80.4 kg)  11/25/16 169 lb (76.7 kg)    Ocular: Sclerae unicteric, pupils equal, round and reactive to light Ear-nose-throat: Oropharynx clear, dentition fair Lymphatic: No cervical, supraclavicular or axillary adenopathy Lungs no rales or rhonchi, good excursion bilaterally Heart regular rate and rhythm, no murmur appreciated Abd soft, nontender, positive bowel sounds, no liver or  spleen tip palpated on exam, no fluid wave  MSK no focal spinal tenderness, no joint edema Neuro: non-focal, well-oriented, appropriate affect Breasts: Deferred   Lab Results  Component Value Date   WBC 12.7 (H) 03/18/2017   HGB 10.8 (L) 03/18/2017   HCT 32.9 (L) 03/18/2017   MCV 87.0 03/18/2017   PLT 218 03/18/2017   No results found for: FERRITIN, IRON, TIBC, UIBC, IRONPCTSAT Lab Results  Component Value Date   RBC 3.78 (L) 03/18/2017   No results found for: KPAFRELGTCHN, LAMBDASER, KAPLAMBRATIO No results found for: IGGSERUM, IGA, IGMSERUM No results found for: Odetta Pink, SPEI   Chemistry      Component Value Date/Time   NA 144 05/26/2017 1445   NA 142 01/21/2017 1013   NA 143 01/16/2016 1005   K 3.9 05/26/2017 1445   K 3.2 (L) 01/21/2017 1013   K 3.3 (L) 01/16/2016 1005   CL 105 05/26/2017 1445   CL 106 01/21/2017 1013   CO2 28 05/26/2017 1445   CO2 29 01/21/2017 1013   CO2 20 (L) 01/16/2016 1005   BUN 10 05/26/2017 1445   BUN 9 01/21/2017 1013   BUN 12.6 01/16/2016 1005   CREATININE 0.93 05/26/2017 1445   CREATININE 0.9 01/21/2017 1013   CREATININE 0.9 01/16/2016 1005      Component Value Date/Time   CALCIUM 9.7 05/26/2017 1445   CALCIUM 9.2 01/21/2017 1013   CALCIUM 9.5 01/16/2016 1005   ALKPHOS 54 05/26/2017 1445   ALKPHOS 62 01/21/2017 1013   ALKPHOS 81 01/16/2016 1005   AST 23 05/26/2017 1445   AST 21 01/21/2017 1013   AST 16 01/16/2016 1005   ALT 25 05/26/2017 1445   ALT 22 01/21/2017 1013   ALT 24 01/16/2016 1005   BILITOT 0.3 05/26/2017 1445   BILITOT 0.60 01/21/2017 1013   BILITOT 0.59 01/16/2016 1005      Impression and Plan: Beverly Cole is a very pleasant 57 yo caucasian female with history of bilateral pulmonary embolism while on hormonal replacement therapy. She also had a slightly low protein S level and positive lupus anticoagulant.  She did well with Xarelto therapy and is now on  full dose aspirin. So far, there has been no evidence of recurrence. She will continue on full dose aspirin.  We will plan to see her back in another 6 months for follow-up.  She will contact our office with any questions or concerns. We can certainly see her sooner if need be.   Laverna Peace, NP 4/25/20192:20 PM

## 2017-06-05 LAB — PROTEIN S ACTIVITY: Protein S Activity: 117 % (ref 63–140)

## 2017-06-05 LAB — PROTEIN S, TOTAL AND FREE
Protein S Ag, Free: 190 % — ABNORMAL HIGH (ref 57–157)
Protein S Ag, Total: 146 % (ref 60–150)

## 2017-06-05 LAB — LUPUS ANTICOAGULANT PANEL
DRVVT: 38.1 s (ref 0.0–47.0)
PTT Lupus Anticoagulant: 30.2 s (ref 0.0–51.9)

## 2017-06-11 ENCOUNTER — Ambulatory Visit (INDEPENDENT_AMBULATORY_CARE_PROVIDER_SITE_OTHER): Payer: 59 | Admitting: Psychology

## 2017-06-11 DIAGNOSIS — F331 Major depressive disorder, recurrent, moderate: Secondary | ICD-10-CM

## 2017-06-19 ENCOUNTER — Ambulatory Visit: Payer: 59 | Admitting: Nurse Practitioner

## 2017-06-23 ENCOUNTER — Other Ambulatory Visit (HOSPITAL_COMMUNITY)
Admission: RE | Admit: 2017-06-23 | Discharge: 2017-06-23 | Disposition: A | Discharge status: Home / Self Care | Payer: 59 | Source: Ambulatory Visit | Attending: Family Medicine | Admitting: Family Medicine

## 2017-06-23 ENCOUNTER — Ambulatory Visit (INDEPENDENT_AMBULATORY_CARE_PROVIDER_SITE_OTHER): Payer: 59 | Admitting: Family Medicine

## 2017-06-23 ENCOUNTER — Encounter: Payer: Self-pay | Admitting: Family Medicine

## 2017-06-23 VITALS — BP 122/70 | HR 79 | Temp 97.9°F | Resp 16 | Ht 69.0 in | Wt 169.0 lb

## 2017-06-23 DIAGNOSIS — N760 Acute vaginitis: Secondary | ICD-10-CM | POA: Diagnosis not present

## 2017-06-23 DIAGNOSIS — R82998 Other abnormal findings in urine: Secondary | ICD-10-CM

## 2017-06-23 DIAGNOSIS — N390 Urinary tract infection, site not specified: Secondary | ICD-10-CM

## 2017-06-23 DIAGNOSIS — R319 Hematuria, unspecified: Secondary | ICD-10-CM

## 2017-06-23 LAB — POC URINALSYSI DIPSTICK (AUTOMATED)
Blood, UA: NEGATIVE
Glucose, UA: NEGATIVE
Nitrite, UA: POSITIVE
Spec Grav, UA: 1.015 (ref 1.010–1.025)
Urobilinogen, UA: 1 E.U./dL
pH, UA: 6 (ref 5.0–8.0)

## 2017-06-23 MED ORDER — CIPROFLOXACIN HCL 250 MG PO TABS: 250.0000 mg | ORAL_TABLET | Freq: Two times a day (BID) | ORAL | 0 refills | Status: DC

## 2017-06-23 MED FILL — CIPROFLOXACIN HCL 250 MG TA: 250 | 5 days supply | Qty: 10 | Fill #0

## 2017-06-23 NOTE — Progress Notes (Signed)
Patient ID: Beverly Cole, female   DOB: [birth year], 57 y.o.   MRN: [id number]    Subjective:  I acted as a Education administrator for Dr. Carollee Herter.  Guerry Bruin, [place]   Patient ID: Beverly Cole, female    DOB: 06/01/1960, 57 y.o.   MRN: [id number]  Chief Complaint  Patient presents with  . Dysuria  . vaginal odor    HPI  Patient is in today for frequency and vaginal odor.  Only had dysuria once.  No vaginal discharge.  Patient Care Team: Carollee Herter, Alferd Apa, DO as PCP - General (Family Medicine) Kem Boroughs, [place] as Nurse Practitioner (Nurse Practitioner) Kathrynn Ducking, MD as Consulting Physician (Neurology) Melida Quitter, MD as Consulting Physician Hollice Espy Phylis Bougie, CNM as Referring Physician (Certified Nurse Midwife) Juanita Craver, MD as Consulting Physician (Gastroenterology) Marin Olp Rudell Cobb, MD as Consulting Physician (Oncology) Cottle, Billey Co., MD as Attending Physician (Psychiatry) Edilia Bo, Candida Peeling, LCSW (Inactive) as Social Worker (Licensed Clinical Social Worker) Burnell Blanks, MD as Consulting Physician (Cardiology) Sharol Roussel, [place] as Physician Assistant (Optometry) Meylor, Marlou Sa, [place] (Chiropractic Medicine) Kem Boroughs, FNP as Nurse Practitioner (Family Medicine)   Past Medical History:  Diagnosis Date  . Anxiety   . Atrial fibrillation ([place])    a. Event monitor 2013 - PACs/bradycardia/SVT/short run of atrial fib by event monitor.   Marland Kitchen BIPOLAR DISORDER UNSPECIFIED   . Bradycardia   . Bursitis of hip 09/1999   Bilateral - Dr. Berenice Primas  . DEPRESSION   . Emphysema lung ([place]) 06/20/2016   pt states pulmonalongist stated started recently  . GERD   . Headache(784.0)    was with menstrual cycle. no longer a problem  . HYPERLIPIDEMIA   . Hypertension   . Hypertension   . IRRITABLE BOWEL SYNDROME, HX OF   . Menopausal state 01/2012   FSH = 88.5  . Mitral valve prolapse 12/17/2000   a. dx 1980s, most recent echo did not demonstrate this.  Marland Kitchen MYALGIA   .  Normal coronary arteries    a. by cardiac CT 2013.  Marland Kitchen PAT (paroxysmal atrial tachycardia) ([place])   . PE (pulmonary embolism) 02/09/2015   a. Bilateral PEs 01/2015 when d-dimer 0.69, CP lying on left side. Followed by heme-onc - + lupus anticoagulant, mildly depressed protein S. Dr. Marin Olp is not certain if her hypercoagulable studies are significant for a thrombophilic state.  . Premature atrial contractions   . Pulmonary embolus ([place]) 02/10/2015  . Shortness of breath    Pulmonary eval 05/2002  . Thyroid nodule 2014  . Tremors of nervous system     Past Surgical History:  Procedure Laterality Date  . COLONOSCOPY    . LAPAROSCOPIC APPENDECTOMY N/A 03/17/2017   Procedure: APPENDECTOMY LAPAROSCOPIC;  Surgeon: Excell Seltzer, MD;  Location: [facility];  Service: General;  Laterality: N/A;  . Lipoma (R) side  1990   Right back    Family History  Problem Relation Age of Onset  . Lung cancer Mother   . Hypertension Mother   . Hyperlipidemia Mother   . Cancer Father        Esophageal cancer  . Hyperlipidemia Father   . Depression Father   . Cancer Brother   . Pulmonary embolism Brother   . Colon cancer Paternal Uncle   . Sarcoidosis Other   . Testicular cancer Other     Social History   Socioeconomic History  . Marital status: Married    Spouse name: Not on file  .  Number of children: 0  . Years of education: Not on file  . Highest education level: Not on file  Occupational History  . Occupation: Nurse-Personal Care/HH    Employer: UNEMPLOYED  Social Needs  . Financial resource strain: Not on file  . Food insecurity:    Worry: Not on file    Inability: Not on file  . Transportation needs:    Medical: Not on file    Non-medical: Not on file  Tobacco Use  . Smoking status: Former Smoker    Packs/day: 1.00    Years: 25.00    Pack years: 25.00    Types: Cigarettes    Last attempt to quit: 02/11/1996    Years since quitting: 21.3  . Smokeless tobacco: Never Used  .  Tobacco comment: Married, lives with spouse. 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 Alcohol use: Yes    Comment: occ  . Drug use: No  . Sexual activity: Yes    Partners: Male    Birth control/protection: Post-menopausal  Lifestyle  . Physical activity:    Days per week: Not on file    Minutes per session: Not on file  . Stress: Not on file  Relationships  . Social connections:    Talks on phone: Not on file    Gets together: Not on file    Attends religious service: Not on file    Active member of club or organization: Not on file    Attends meetings of clubs or organizations: Not on file    Relationship status: Not on file  . Intimate partner violence:    Fear of current or ex partner: Not on file    Emotionally abused: Not on file    Physically abused: Not on file    Forced sexual activity: Not on file  Other Topics Concern  . Not on file  Social History Narrative   Married, lives with spouse in [place]. 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 ALPRAZolam (XANAX) 0.25 MG tablet TAKE 1 TO 2 TABLETS BY MOUTH TWICE DAILY AS NEEDED FOR ANXIETY  1  . aspirin EC 325 MG tablet Take 325 mg by mouth daily.    Marland Kitchen buPROPion (WELLBUTRIN SR) 200 MG 12 hr tablet TAKE 1 TABLET BY MOUTH DAILY IN THE MORNING  0  . CARTIA XT 180 MG 24 hr capsule TAKE 1 CAPSULE BY MOUTH DAILY. 90 capsule 1  . divalproex (DEPAKOTE ER) 500 MG 24 hr tablet Take 1,000 mg by mouth at bedtime.  0  . fluticasone (FLONASE) 50 MCG/ACT nasal spray Place 2 sprays into the nose daily as needed. For allergies    . ketoconazole (NIZORAL) 2 % cream Apply 1 application topically daily. 60 g 3  . Probiotic Product (PROBIOTIC DAILY PO) Take 1 tablet by mouth daily.     . propranolol (INDERAL) 10 MG tablet Take 20 mg by mouth daily as needed (shakiness).  Reported on 05/17/2015  1  . sertraline (ZOLOFT) 50 MG tablet Take 50 mg by mouth daily.  1   No facility-administered medications prior to visit.     Allergies  Allergen Reactions  . Cariprazine Other (See Comments)    Patient becomes suicidal when taking this medication.   Marland Kitchen Lopressor [Metoprolol] Other (  See Comments)    Hallucinations hair falls out  . Nickel Rash    Review of Systems  Constitutional: Negative for fever and malaise/fatigue.  HENT: Negative for congestion.   Eyes: Negative for blurred vision.  Respiratory: Negative for cough and shortness of breath.   Cardiovascular: Negative for chest pain, palpitations and leg swelling.  Gastrointestinal: Negative for vomiting.  Genitourinary: Positive for dysuria and frequency. Negative for flank pain, hematuria and urgency.  Musculoskeletal: Negative for back pain.  Skin: Negative for rash.  Neurological: Negative for loss of consciousness and headaches.       Objective:    Physical Exam  Constitutional: She is oriented to person, place, and time. She appears well-developed and well-nourished.  HENT:  Head: Normocephalic and atraumatic.  Eyes: Conjunctivae and EOM are normal.  Neck: Normal range of motion. Neck supple. No JVD present. Carotid bruit is not present. No thyromegaly present.  Cardiovascular: Normal rate, regular rhythm and normal heart sounds.  No murmur heard. Pulmonary/Chest: Effort normal and breath sounds normal. No respiratory distress. She has no wheezes. She has no rales. She exhibits no tenderness.  Musculoskeletal: She exhibits no edema.  Neurological: She is alert and oriented to person, place, and time.  Psychiatric: She has a normal mood and affect.  Nursing note and vitals reviewed.   BP 122/70 (BP Location: Right Arm, Cuff Size: Normal)   Pulse 79   Temp 97.9 F (36.6 C) (Oral)   Resp 16   Ht _0  (1.753 m)   Wt 169 lb (76.7 kg)   LMP 05/11/2012   SpO2 95%   BMI 24.96 kg/m  Wt  Readings from Last 3 Encounters:  06/23/17 169 lb (76.7 kg)  06/04/17 171 lb (77.6 kg)  05/26/17 174 lb (78.9 kg)   BP Readings from Last 3 Encounters:  06/23/17 122/70  06/04/17 125/83  05/26/17 136/80     Immunization History  Administered Date(s) Administered  . Influenza Split 03/04/2011  . Influenza Whole 02/06/2012, 11/01/2012  . Influenza,inj,Quad PF,6+ Mos 10/31/2014, 11/13/2015, 11/26/2016  . Influenza-Unspecified 10/27/2013  . PPD Test 03/23/2012, 04/05/2013, 05/16/2014  . Td 05/31/2002  . Tdap 05/13/2011  . Zoster Recombinat (Shingrix) 11/25/2016, 01/16/2017    Health Maintenance  Topic Date Due  . INFLUENZA VACCINE  09/10/2017  . MAMMOGRAM  11/13/2018  . PAP SMEAR  06/18/2019  . TETANUS/TDAP  05/12/2021  . COLONOSCOPY  11/27/2025  . Hepatitis C Screening  Completed  . HIV Screening  Completed    Lab Results  Component Value Date   WBC 5.4 06/04/2017   HGB 13.6 06/04/2017   HCT 40.1 06/04/2017   PLT 214 06/04/2017   GLUCOSE 92 06/04/2017   CHOL 259 (H) 05/26/2017   TRIG 295.0 (H) 05/26/2017   HDL 50.50 05/26/2017   LDLDIRECT 186.0 05/26/2017   LDLCALC 152 (H) 11/13/2015   ALT 21 06/04/2017   AST 21 06/04/2017   NA 142 06/04/2017   K 3.8 06/04/2017   CL 109 06/04/2017   CREATININE 0.86 06/04/2017   BUN 10 06/04/2017   CO2 23 06/04/2017   TSH 4.00 11/25/2016   INR 1.10 02/09/2015   HGBA1C 5.3 03/20/2009   MICROALBUR 0.6 05/19/2011    Lab Results  Component Value Date   TSH 4.00 11/25/2016   Lab Results  Component Value Date   WBC 5.4 06/04/2017   HGB 13.6 06/04/2017   HCT 40.1 06/04/2017   MCV 86.4 06/04/2017   PLT 214 06/04/2017   Lab Results  Component Value Date   NA 142 06/04/2017   K 3.8 06/04/2017   CHLORIDE 110 (H) 01/16/2016   CO2 23 06/04/2017   GLUCOSE 92 06/04/2017   BUN 10 06/04/2017   CREATININE 0.86 06/04/2017   BILITOT 0.3 06/04/2017   ALKPHOS 61 06/04/2017   AST 21 06/04/2017   ALT 21 06/04/2017   PROT 7.5  06/04/2017   ALBUMIN 4.1 06/04/2017   CALCIUM 9.9 06/04/2017   ANIONGAP 10 06/04/2017   EGFR 75 (L) 01/16/2016   GFR 65.93 05/26/2017   Lab Results  Component Value Date   CHOL 259 (H) 05/26/2017   Lab Results  Component Value Date   HDL 50.50 05/26/2017   Lab Results  Component Value Date   LDLCALC 152 (H) 11/13/2015   Lab Results  Component Value Date   TRIG 295.0 (H) 05/26/2017   Lab Results  Component Value Date   CHOLHDL 5 05/26/2017   Lab Results  Component Value Date   HGBA1C 5.3 03/20/2009         Assessment & Plan:   Problem List Items Addressed This Visit    None    Visit Diagnoses    Acute vaginitis    -  Primary   Relevant Orders   POCT Urinalysis Dipstick (Automated) (Completed)   Urine cytology ancillary only   Urinary tract infection with hematuria, site unspecified       Relevant Medications   ciprofloxacin (CIPRO) 250 MG tablet   Other Relevant Orders   Urine Culture   Leukocytes in urine       Relevant Orders   Urine Culture      I am having Kendrick Fries K. Spradlin start on ciprofloxacin. I am also having her maintain her fluticasone, Probiotic Product (PROBIOTIC DAILY PO), propranolol, ketoconazole, buPROPion, ALPRAZolam, divalproex, aspirin EC, CARTIA XT, and sertraline.  Meds ordered this encounter  Medications  . ciprofloxacin (CIPRO) 250 MG tablet    Sig: Take 1 tablet (250 mg total) by mouth 2 (two) times daily.    Dispense:  10 tablet    Refill:  0    CMA served as scribe during this visit. History, Physical and Plan performed by medical provider. Documentation and orders reviewed and attested to.  Ann Held, DO

## 2017-06-23 NOTE — Patient Instructions (Signed)

## 2017-06-24 LAB — URINE CULTURE
MICRO NUMBER:: 90586174
Result:: NO GROWTH
SPECIMEN QUALITY:: ADEQUATE

## 2017-06-26 LAB — URINE CYTOLOGY ANCILLARY ONLY
Bacterial vaginitis: NEGATIVE
Candida vaginitis: NEGATIVE

## 2017-06-29 MED FILL — DEXILANT DR 60 MG CAPSULE: 60 | 30 days supply | Qty: 30 | Fill #2

## 2017-07-02 ENCOUNTER — Ambulatory Visit (INDEPENDENT_AMBULATORY_CARE_PROVIDER_SITE_OTHER): Payer: 59 | Admitting: Obstetrics and Gynecology

## 2017-07-02 ENCOUNTER — Encounter: Payer: Self-pay | Admitting: Obstetrics and Gynecology

## 2017-07-02 ENCOUNTER — Other Ambulatory Visit: Payer: Self-pay

## 2017-07-02 ENCOUNTER — Other Ambulatory Visit (HOSPITAL_COMMUNITY)
Admission: RE | Admit: 2017-07-02 | Discharge: 2017-07-02 | Disposition: A | Discharge status: Home / Self Care | Payer: 59 | Source: Ambulatory Visit | Attending: Obstetrics and Gynecology | Admitting: Obstetrics and Gynecology

## 2017-07-02 VITALS — BP 126/64 | HR 70 | Resp 18 | Ht 69.0 in | Wt 168.8 lb

## 2017-07-02 DIAGNOSIS — R3915 Urgency of urination: Secondary | ICD-10-CM

## 2017-07-02 DIAGNOSIS — N952 Postmenopausal atrophic vaginitis: Secondary | ICD-10-CM | POA: Diagnosis not present

## 2017-07-02 DIAGNOSIS — N941 Unspecified dyspareunia: Secondary | ICD-10-CM

## 2017-07-02 DIAGNOSIS — Z124 Encounter for screening for malignant neoplasm of cervix: Secondary | ICD-10-CM | POA: Insufficient documentation

## 2017-07-02 DIAGNOSIS — Z01419 Encounter for gynecological examination (general) (routine) without abnormal findings: Secondary | ICD-10-CM | POA: Diagnosis not present

## 2017-07-02 DIAGNOSIS — R350 Frequency of micturition: Secondary | ICD-10-CM

## 2017-07-02 NOTE — Progress Notes (Signed)
57 y.o. G0P0 MarriedCaucasianF here for annual exam. No vaginal bleeding. Can't have sex because it is too painful. They are okay with it.   H/O PE on HRT, has anticardiolipin antibodies and protein S deficiency.    She went off of Seroquel, secondary to side effects. She has a diagnosis of bipolar, really more depression. At the moment she is doing okay with her other meds, she has an appointment to see her Psychiatrist. Her husband is a great support for her, married x 30 years.      Patient's last menstrual period was 05/11/2012.          Sexually active: Yes.   No intercourse.  The current method of family planning is post menopausal status.    Exercising: No.  The patient does not participate in regular exercise at present. Smoker:  Former  Health Maintenance: Pap: 06-17-16 Neg, 06-05-15 Neg:Neg HR HPV History of abnormal Pap:  Yes, Hx cryotherapy years ago MMG:  11-12-16 Density C/Neg/BiRads1 Colonoscopy: 10-19-15 normal BMD:   never TDaP:  05-13-11 Gardasil: no   reports that she quit smoking about 21 years ago. Her smoking use included cigarettes. She has a 25.00 pack-year smoking history. She has never used smokeless tobacco. She reports that she drinks alcohol. She reports that she does not use drugs. Very rare ETOH. She is on disability  Past Medical History:  Diagnosis Date  . Anxiety   . Atrial fibrillation ([place])    a. Event monitor 2013 - PACs/bradycardia/SVT/short run of atrial fib by event monitor.   Marland Kitchen BIPOLAR DISORDER UNSPECIFIED   . Bradycardia   . Bursitis of hip 09/1999   Bilateral - Dr. Berenice Primas  . DEPRESSION   . Emphysema lung ([place]) 06/20/2016   pt states pulmonalongist stated started recently  . GERD   . Headache(784.0)    was with menstrual cycle. no longer a problem  . HYPERLIPIDEMIA   . Hypertension   . Hypertension   . IRRITABLE BOWEL SYNDROME, HX OF   . Menopausal state 01/2012   FSH = 88.5  . Mitral valve prolapse 12/17/2000   a. dx 1980s, most recent  echo did not demonstrate this.  Marland Kitchen MYALGIA   . Normal coronary arteries    a. by cardiac CT 2013.  Marland Kitchen PAT (paroxysmal atrial tachycardia) ([place])   . PE (pulmonary embolism) 02/09/2015   a. Bilateral PEs 01/2015 when d-dimer 0.69, CP lying on left side. Followed by heme-onc - + lupus anticoagulant, mildly depressed protein S. Dr. Marin Olp is not certain if her hypercoagulable studies are significant for a thrombophilic state.  . Premature atrial contractions   . Pulmonary embolus ([place]) 02/10/2015  . Shortness of breath    Pulmonary eval 05/2002  . Thyroid nodule 2014  . Tremors of nervous system     Past Surgical History:  Procedure Laterality Date  . APPENDECTOMY    . COLONOSCOPY    . LAPAROSCOPIC APPENDECTOMY N/A 03/17/2017   Procedure: APPENDECTOMY LAPAROSCOPIC;  Surgeon: Excell Seltzer, MD;  Location: [facility];  Service: General;  Laterality: N/A;  . Lipoma (R) side  1990   Right back    Current Outpatient Medications  Medication Sig Dispense Refill  . ALPRAZolam (XANAX) 0.25 MG tablet TAKE 1 TO 2 TABLETS BY MOUTH TWICE DAILY AS NEEDED FOR ANXIETY  1  . aspirin EC 325 MG tablet Take 325 mg by mouth daily.    Marland Kitchen buPROPion (WELLBUTRIN SR) 200 MG 12 hr tablet TAKE 1 TABLET BY MOUTH DAILY  IN THE MORNING  0  . CARTIA XT 180 MG 24 hr capsule TAKE 1 CAPSULE BY MOUTH DAILY. 90 capsule 1  . divalproex (DEPAKOTE ER) 500 MG 24 hr tablet Take 1,000 mg by mouth at bedtime.  0  . fluticasone (FLONASE) 50 MCG/ACT nasal spray Place 2 sprays into the nose daily as needed. For allergies    . ketoconazole (NIZORAL) 2 % cream Apply 1 application topically daily. 60 g 3  . Probiotic Product (PROBIOTIC DAILY PO) Take 1 tablet by mouth daily.     . propranolol (INDERAL) 10 MG tablet Take 20 mg by mouth daily as needed (shakiness). Reported on 05/17/2015  1  . sertraline (ZOLOFT) 50 MG tablet Take 50 mg by mouth daily.  1   No current facility-administered medications for this visit.     Family History   Problem Relation Age of Onset  . Lung cancer Mother   . Hypertension Mother   . Hyperlipidemia Mother   . Cancer Father        Esophageal cancer  . Hyperlipidemia Father   . Depression Father   . Cancer Brother   . Pulmonary embolism Brother   . Colon cancer Paternal Uncle   . Sarcoidosis Other   . Testicular cancer Other     Review of Systems  Constitutional: Negative.   HENT: Negative.   Eyes: Negative.   Respiratory: Negative.   Cardiovascular: Positive for palpitations.  Gastrointestinal: Negative.   Endocrine: Negative.   Genitourinary: Positive for frequency and urgency.  Musculoskeletal: Negative.   Skin: Negative.   Allergic/Immunologic: Negative.   Neurological: Negative.   Hematological: Negative.   Psychiatric/Behavioral:       Depression  She had a "UTI" a few weeks ago (culture was negative). She was treated, but is still having frequency and urgency. No current pain. Nocturia x 5.   Exam:   BP 126/64 (BP Location: Right Arm, Patient Position: Sitting, Cuff Size: Normal)   Pulse 70   Resp 18   Ht 5\' 9"  (1.753 m)   Wt 168 lb 12.8 oz (76.6 kg)   LMP 05/11/2012   BMI 24.93 kg/m   Weight change: @WEIGHTCHANGE @ Height:   Height: 5\' 9"  (175.3 cm)  Ht Readings from Last 3 Encounters:  07/02/17 5\' 9"  (1.753 m)  06/23/17 5\' 9"  (1.753 m)  05/26/17 5\' 9"  (1.753 m)    General appearance: alert, cooperative and appears stated age Head: Normocephalic, without obvious abnormality, atraumatic Neck: no adenopathy, supple, symmetrical, trachea midline and thyroid normal to inspection and palpation Lungs: clear to auscultation bilaterally Cardiovascular: regular rate and rhythm Breasts: normal appearance, no masses or tenderness Abdomen: soft, non-tender; non distended,  no masses,  no organomegaly Extremities: extremities normal, atraumatic, no cyanosis or edema Skin: Skin color, texture, turgor normal. No rashes or lesions Lymph nodes: Cervical, supraclavicular,  and axillary nodes normal. No abnormal inguinal nodes palpated Neurologic: Grossly normal   Pelvic: External genitalia:  no lesions              Urethra:  normal appearing urethra with no masses, tenderness or lesions              Bartholins and Skenes: normal                 Vagina: normal appearing atrophic vagina with normal color and discharge, no lesions              Cervix: no lesions  Bimanual Exam:  Uterus:  normal size, contour, position, consistency, mobility, non-tender              Adnexa: no mass, fullness, tenderness               Rectovaginal: Confirms               Anus:  normal sphincter tone, no lesions  Chaperone was present for exam.  A:  Well Woman with normal exam  Urinary frequency, and urgency  P:   Pap with reflex hpv (patient desires)  Mammogram and colonoscopy are UTD  Discussed breast self exam  Discussed calcium and vit D intake  Labs with primary MD  Urine for ua, c&s  If culture is negative and she continues to have urinary c/o would send to urology

## 2017-07-02 NOTE — Patient Instructions (Signed)

## 2017-07-03 LAB — URINE CULTURE

## 2017-07-03 LAB — URINALYSIS, MICROSCOPIC ONLY: Casts: NONE SEEN /lpf

## 2017-07-03 MED FILL — SERTRALINE HCL 50 MG TABLET: 50 | 30 days supply | Qty: 30 | Fill #1

## 2017-07-07 LAB — CYTOLOGY - PAP: Diagnosis: NEGATIVE

## 2017-07-13 ENCOUNTER — Telehealth: Payer: Self-pay | Admitting: Obstetrics and Gynecology

## 2017-07-13 ENCOUNTER — Encounter: Payer: Self-pay | Admitting: Obstetrics and Gynecology

## 2017-07-13 DIAGNOSIS — R3915 Urgency of urination: Secondary | ICD-10-CM

## 2017-07-13 DIAGNOSIS — R350 Frequency of micturition: Secondary | ICD-10-CM

## 2017-07-13 NOTE — Telephone Encounter (Signed)
Patient sent the following correspondence through [place]. Routing to triage to assist patient with request.  ----- Message from [place], Generic sent at 07/13/2017 4:54 AM EDT -----    I am still having problems with urination. I would like to see a urologist ASAP. After I saw you I counted how many times I urinated in 24 hours and it was 16!! It is clear at night.   Thanks Beverly Cole

## 2017-07-13 NOTE — Telephone Encounter (Signed)
Dr.Jertosn, okay to refer to [facility] at this time?

## 2017-07-13 NOTE — Telephone Encounter (Signed)
Yes, please place the referral

## 2017-07-14 NOTE — Telephone Encounter (Signed)
Referral placed to [facility] at this time. All information faxed. Left detailed message for patient that referral has been placed and that she will be contacted regarding scheduling. Encounter closed.

## 2017-07-15 MED FILL — DIVALPROEX SOD ER 500 MG TA: 500 | 90 days supply | Qty: 180 | Fill #0

## 2017-07-16 ENCOUNTER — Ambulatory Visit (INDEPENDENT_AMBULATORY_CARE_PROVIDER_SITE_OTHER): Payer: 59 | Admitting: Psychology

## 2017-07-16 DIAGNOSIS — F331 Major depressive disorder, recurrent, moderate: Secondary | ICD-10-CM

## 2017-07-29 MED FILL — DEXILANT DR 60 MG CAPSULE: 60 | 30 days supply | Qty: 30 | Fill #3

## 2017-07-31 DIAGNOSIS — R350 Frequency of micturition: Secondary | ICD-10-CM | POA: Diagnosis not present

## 2017-07-31 DIAGNOSIS — R351 Nocturia: Secondary | ICD-10-CM | POA: Diagnosis not present

## 2017-08-04 DIAGNOSIS — F3181 Bipolar II disorder: Secondary | ICD-10-CM | POA: Diagnosis not present

## 2017-08-06 MED FILL — SERTRALINE HCL 50 MG TABLET: 50 | 90 days supply | Qty: 90 | Fill #0

## 2017-09-01 ENCOUNTER — Ambulatory Visit (INDEPENDENT_AMBULATORY_CARE_PROVIDER_SITE_OTHER): Payer: 59 | Admitting: Psychology

## 2017-09-01 DIAGNOSIS — F331 Major depressive disorder, recurrent, moderate: Secondary | ICD-10-CM

## 2017-09-02 MED FILL — BUPROPION HCL SR 200 MG TAB: 200 | 90 days supply | Qty: 90 | Fill #0

## 2017-09-02 MED FILL — CARTIA XT 180 MG CAPSULE SA: 180 | 90 days supply | Qty: 90 | Fill #1

## 2017-09-02 MED FILL — DEXILANT DR 60 MG CAPSULE: 60 | 30 days supply | Qty: 30 | Fill #4

## 2017-10-05 MED FILL — DIVALPROEX SOD ER 500 MG TA: 500 | 90 days supply | Qty: 180 | Fill #0

## 2017-10-05 MED FILL — DEXILANT DR 60 MG CAPSULE: 60 | 30 days supply | Qty: 30 | Fill #5

## 2017-10-06 ENCOUNTER — Ambulatory Visit: Payer: 59 | Admitting: Psychology

## 2017-10-15 DIAGNOSIS — L719 Rosacea, unspecified: Secondary | ICD-10-CM | POA: Diagnosis not present

## 2017-10-15 DIAGNOSIS — L718 Other rosacea: Secondary | ICD-10-CM | POA: Diagnosis not present

## 2017-10-15 DIAGNOSIS — H16423 Pannus (corneal), bilateral: Secondary | ICD-10-CM | POA: Diagnosis not present

## 2017-10-15 DIAGNOSIS — H10413 Chronic giant papillary conjunctivitis, bilateral: Secondary | ICD-10-CM | POA: Diagnosis not present

## 2017-10-28 ENCOUNTER — Ambulatory Visit (INDEPENDENT_AMBULATORY_CARE_PROVIDER_SITE_OTHER): Payer: 59 | Admitting: Psychology

## 2017-10-28 DIAGNOSIS — F3181 Bipolar II disorder: Secondary | ICD-10-CM | POA: Diagnosis not present

## 2017-10-28 DIAGNOSIS — F331 Major depressive disorder, recurrent, moderate: Secondary | ICD-10-CM

## 2017-11-02 MED FILL — SERTRALINE HCL 50 MG TABLET: 50 | 90 days supply | Qty: 90 | Fill #1

## 2017-11-02 MED FILL — DEXILANT DR 60 MG CAPSULE: 60 | 30 days supply | Qty: 30 | Fill #6

## 2017-11-03 ENCOUNTER — Encounter: Payer: Self-pay | Admitting: Family

## 2017-11-05 IMAGING — CR DG CHEST 2V
2 series · 2 of 2 positions shown · non-contrast
Comparison: 08/11/2011

CLINICAL DATA: Preoperative evaluation for ECT

EXAM:
CHEST - 2 VIEW

[chest pa]
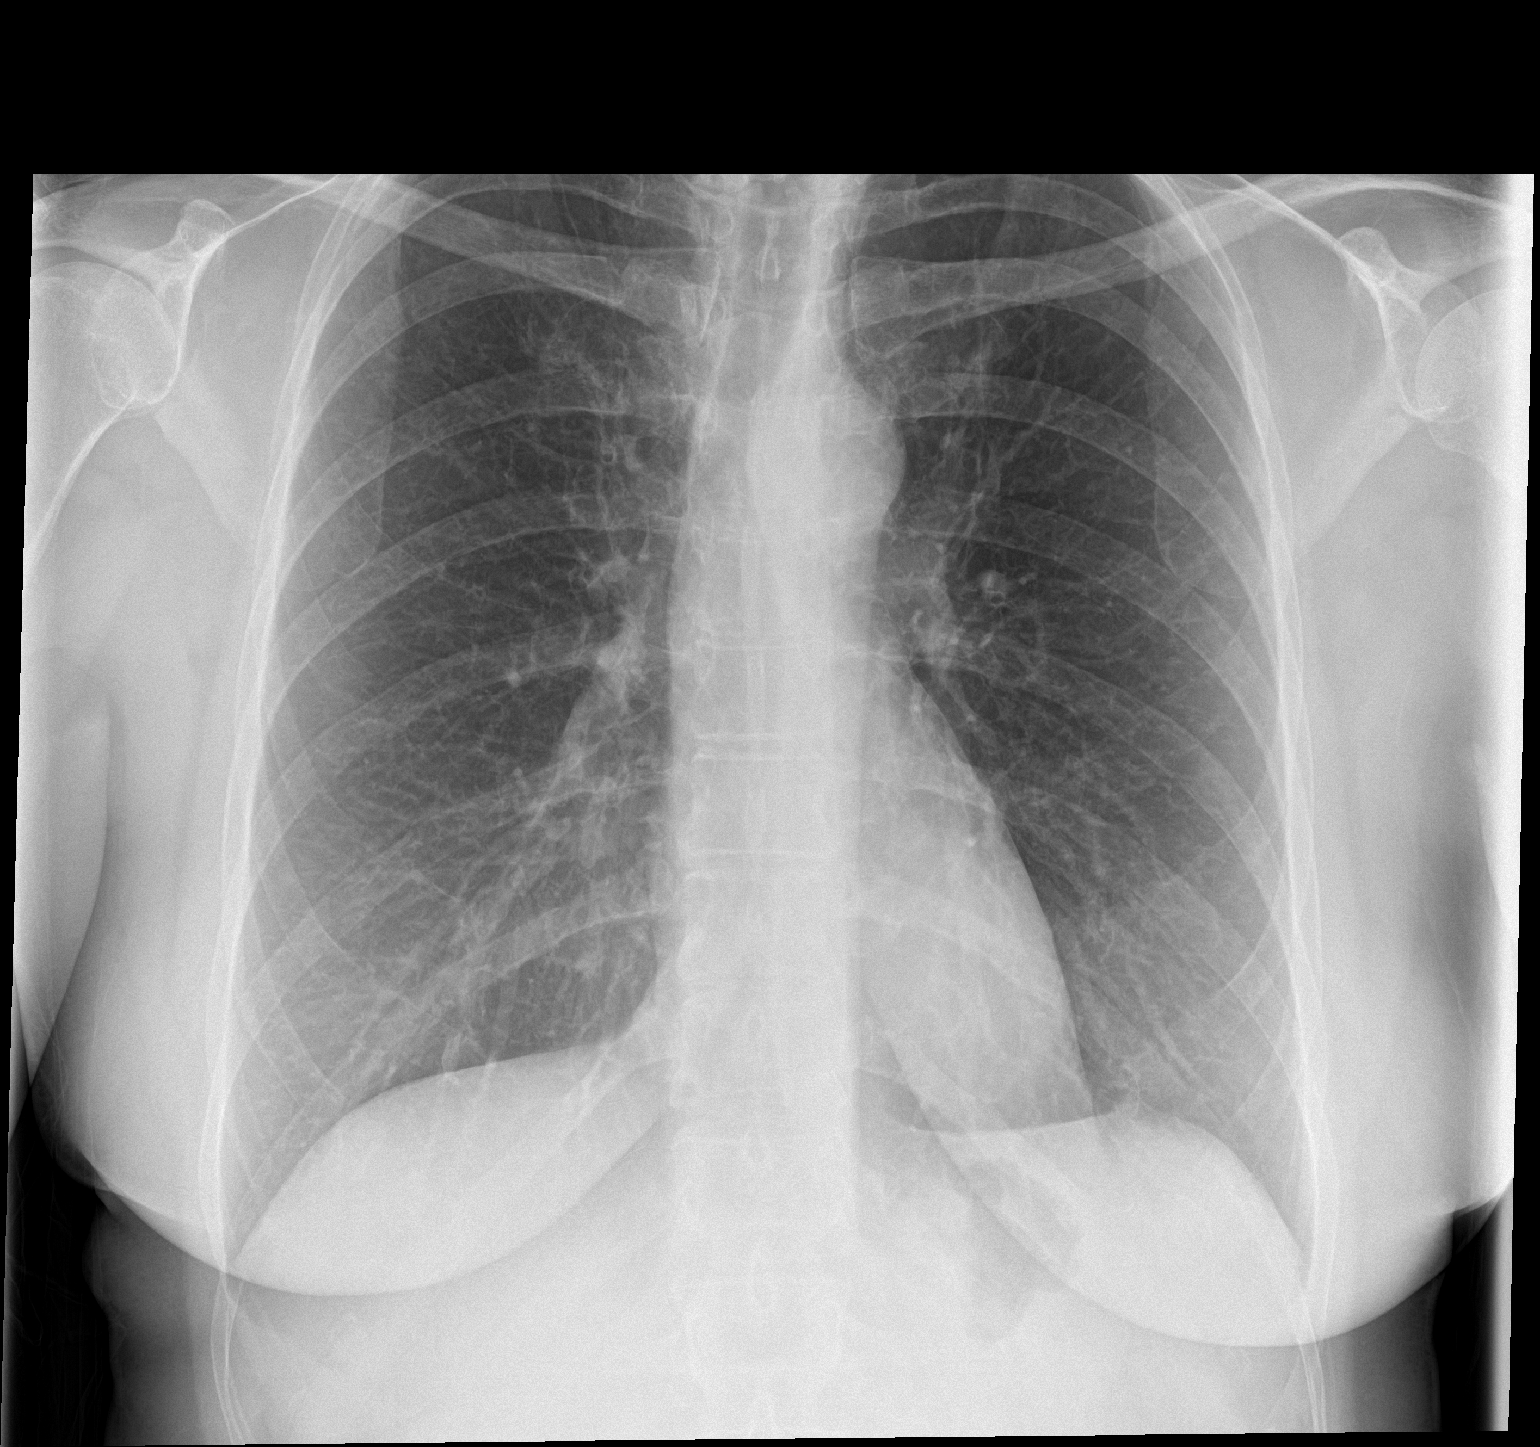

[chest lat]
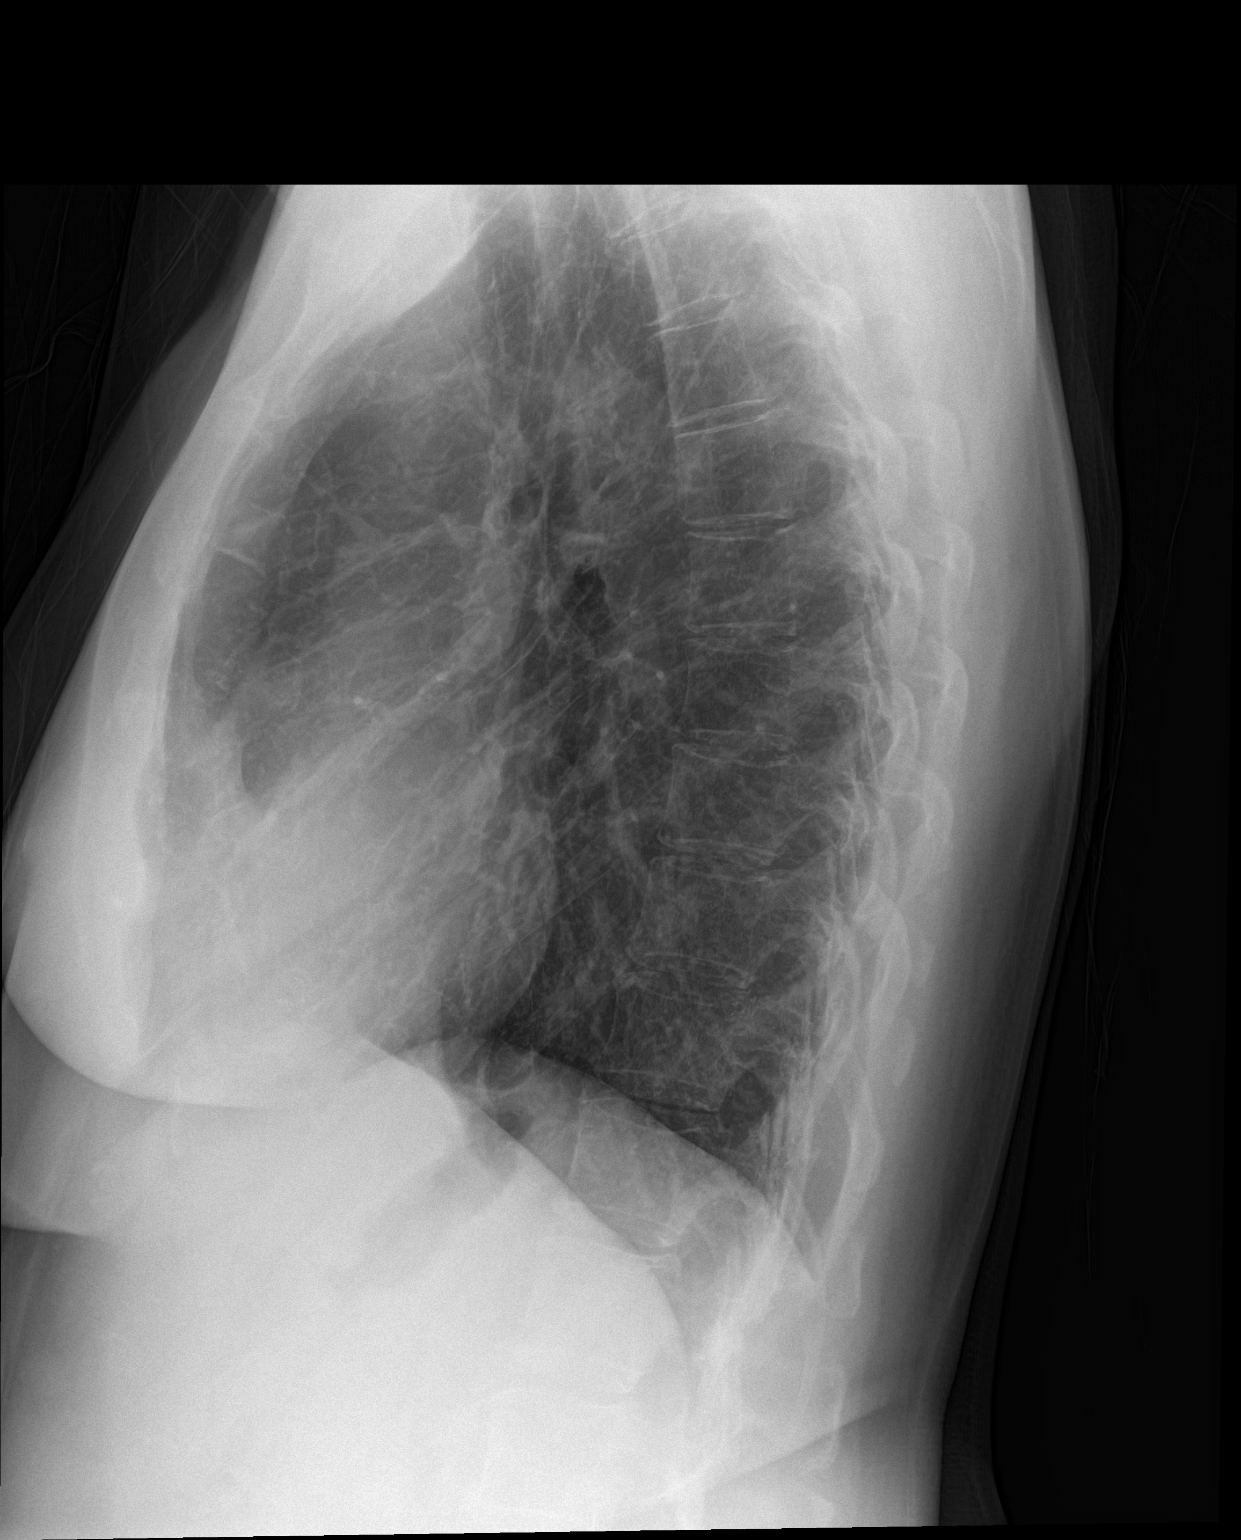

[2 of 2 positions shown; findings below may reference images not displayed]

FINDINGS: The heart size and mediastinal contours are within normal limits.
Both lungs are clear. The visualized skeletal structures are
unremarkable.
IMPRESSION: No active disease.

## 2017-11-11 ENCOUNTER — Ambulatory Visit: Payer: Self-pay | Admitting: Cardiovascular Disease

## 2017-11-11 NOTE — Progress Notes (Deleted)
No chief complaint on file.   History of Present Illness: 57 yo female with history of HLD, HTN, Bipolar disorder, IBS, Mitral valve prolapse, bilateral PE (December 2016) here today for cardiac follow up. She was seen as a new patient in August 2013 to establish cardiology care. She was diagnosed with MVP in the 1980s. She told me that she had been having sharp pains in her chest, occasionally associated with SOB. These episodes lasted for a few minutes. She was seen in the ED at [facility] 08/11/11 and had a coronary CTA 08/12/11 which showed normal coronary arteries. Also noted skipped heart beats several times per week. I ordered an echo which showed normal LV size and function, mild MR. A 48 hour Holter monitor showed NSR with bradycardia, PACs and runs of SVT with short run of atrial fibrillation. She was seen in EP clinic by Dr. Rayann Heman October 2013 and he felt that this was most likely a long RP tachycardia. He did not recommend a change in therapy but suggested Flecainide if symptoms worsened and EP f/u only if there were changes. She was last seen in EP clinic in January 2014. She felt that her Toprol was causing hallucinations so she was started on Cardizem and Toprol stopped November 2014. She was found to have bilateral PE in December 2016 and has been followed in Oncology. She has been treated with Xarelto. She was seen in our office July 2017 by Melina Copa, PA-C following an ED visit for chest pain. Troponin negative. Reportedly had PVCs on the monitor in the ED. Chest CTA without evidence of recurrent PE. Nuclear stress test August 2017 with no evidence of ischemia. LVEF=69%. Her Xarelto has been stopped. Echo December 2017 with normal LV systolic function, NKNL=97-67%. Trace MR.   She is here today for follow up. The patient denies any chest pain, dyspnea, palpitations, lower extremity edema, orthopnea, PND, dizziness, near syncope or syncope.   Primary Care Physician: Carollee Herter, Alferd Apa,  DO   Past Medical History:  Diagnosis Date  . Anxiety   . Atrial fibrillation ([place])    a. Event monitor 2013 - PACs/bradycardia/SVT/short run of atrial fib by event monitor.   Beverly Cole BIPOLAR DISORDER UNSPECIFIED   . Bradycardia   . Bursitis of hip 09/1999   Bilateral - Dr. Berenice Primas  . DEPRESSION   . Emphysema lung ([place]) 06/20/2016   pt states pulmonalongist stated started recently  . GERD   . Headache(784.0)    was with menstrual cycle. no longer a problem  . HYPERLIPIDEMIA   . Hypertension   . Hypertension   . IRRITABLE BOWEL SYNDROME, HX OF   . Menopausal state 01/2012   FSH = 88.5  . Mitral valve prolapse 12/17/2000   a. dx 1980s, most recent echo did not demonstrate this.  Beverly Cole MYALGIA   . Normal coronary arteries    a. by cardiac CT 2013.  Beverly Cole PAT (paroxysmal atrial tachycardia) ([place])   . PE (pulmonary embolism) 02/09/2015   a. Bilateral PEs 01/2015 when d-dimer 0.69, CP lying on left side. Followed by heme-onc - + lupus anticoagulant, mildly depressed protein S. Dr. Marin Olp is not certain if her hypercoagulable studies are significant for a thrombophilic state.  . Premature atrial contractions   . Pulmonary embolus ([place]) 02/10/2015  . Shortness of breath    Pulmonary eval 05/2002  . Thyroid nodule 2014  . Tremors of nervous system     Past Surgical History:  Procedure Laterality Date  .  APPENDECTOMY    . COLONOSCOPY    . LAPAROSCOPIC APPENDECTOMY N/A 03/17/2017   Procedure: APPENDECTOMY LAPAROSCOPIC;  Surgeon: Excell Seltzer, MD;  Location: [facility]S;  Service: General;  Laterality: N/A;  . Lipoma (R) side  1990   Right back    Current Outpatient Medications  Medication Sig Dispense Refill  . ALPRAZolam (XANAX) 0.25 MG tablet TAKE 1 TO 2 TABLETS BY MOUTH TWICE DAILY AS NEEDED FOR ANXIETY  1  . aspirin EC 325 MG tablet Take 325 mg by mouth daily.    Beverly Cole buPROPion (WELLBUTRIN SR) 200 MG 12 hr tablet TAKE 1 TABLET BY MOUTH DAILY IN THE MORNING  0  . CARTIA XT 180 MG 24 hr  capsule TAKE 1 CAPSULE BY MOUTH DAILY. 90 capsule 1  . divalproex (DEPAKOTE ER) 500 MG 24 hr tablet Take 1,000 mg by mouth at bedtime.  0  . fluticasone (FLONASE) 50 MCG/ACT nasal spray Place 2 sprays into the nose daily as needed. For allergies    . ketoconazole (NIZORAL) 2 % cream Apply 1 application topically daily. 60 g 3  . Probiotic Product (PROBIOTIC DAILY PO) Take 1 tablet by mouth daily.     . propranolol (INDERAL) 10 MG tablet Take 20 mg by mouth daily as needed (shakiness). Reported on 05/17/2015  1  . sertraline (ZOLOFT) 50 MG tablet Take 50 mg by mouth daily.  1   No current facility-administered medications for this visit.     Allergies  Allergen Reactions  . Cariprazine Other (See Comments)    Patient becomes suicidal when taking this medication.   . Lopressor [Metoprolol] Other (See Comments)    Hallucinations hair falls out  . Nickel Rash  . Ciprocin-Fluocin-Procin [Fluocinolone] Nausea Only    Social History   Socioeconomic History  . Marital status: Married    Spouse name: Not on file  . Number of children: 0  . Years of education: Not on file  . Highest education level: Not on file  Occupational History  . Occupation: Nurse-Personal Care/HH    Employer: UNEMPLOYED  Social Needs  . Financial resource strain: Not on file  . Food insecurity:    Worry: Not on file    Inability: Not on file  . Transportation needs:    Medical: Not on file    Non-medical: Not on file  Tobacco Use  . Smoking status: Former Smoker    Packs/day: 1.00    Years: 25.00    Pack years: 25.00    Types: Cigarettes    Last attempt to quit: 02/11/1996    Years since quitting: 21.7  . Smokeless tobacco: Never Used  . Tobacco comment: Married, lives with spouse. 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 Alcohol use: Yes    Comment: occ  . Drug use: No  . Sexual activity: Yes    Partners: Male    Birth control/protection: Post-menopausal  Lifestyle   . Physical activity:    Days per week: Not on file    Minutes per session: Not on file  . Stress: Not on file  Relationships  . Social connections:    Talks on phone: Not on file    Gets together: Not on file    Attends religious service: Not on file    Active member of club or organization: Not on file    Attends meetings of clubs or organizations: Not on file    Relationship status: Not on file  .  Intimate partner violence:    Fear of current or ex partner: Not on file    Emotionally abused: Not on file    Physically abused: Not on file    Forced sexual activity: Not on file  Other Topics Concern  . Not on file  Social History Narrative   Married, lives with spouse in [place]. 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 Lung cancer Mother   . Hypertension Mother   . Hyperlipidemia Mother   . Cancer Father        Esophageal cancer  . Hyperlipidemia Father   . Depression Father   . Cancer Brother   . Pulmonary embolism Brother   . Colon cancer Paternal Uncle   . Sarcoidosis Other   . Testicular cancer Other     Review of Systems:  As stated in the HPI and otherwise negative.   LMP 05/11/2012   Physical Examination: General: Well developed, well nourished, NAD  HEENT: OP clear, mucus membranes moist  SKIN: warm, dry. No rashes. Neuro: No focal deficits  Musculoskeletal: Muscle strength 5/5 all ext  Psychiatric: Mood and affect normal  Neck: No JVD, no carotid bruits, no thyromegaly, no lymphadenopathy.  Lungs:Clear bilaterally, no wheezes, rhonci, crackles Cardiovascular: Regular rate and rhythm. No murmurs, gallops or rubs. Abdomen:Soft. Bowel sounds present. Non-tender.  Extremities: No lower extremity edema. Pulses are 2 + in the bilateral DP/PT.  EKG:  EKG is not *** ordered today. The ekg ordered today demonstrates   Recent Labs: 11/25/2016: TSH  4.00 06/04/2017: ALT 21; BUN 10; Creatinine 0.86; Hemoglobin 13.6; Platelet Count 214; Potassium 3.8; Sodium 142   Lipid Panel    Component Value Date/Time   CHOL 259 (H) 05/26/2017 1445   TRIG 295.0 (H) 05/26/2017 1445   HDL 50.50 05/26/2017 1445   CHOLHDL 5 05/26/2017 1445   VLDL 59.0 (H) 05/26/2017 1445   LDLCALC 152 (H) 11/13/2015 1500   LDLDIRECT 186.0 05/26/2017 1445     Wt Readings from Last 3 Encounters:  07/02/17 168 lb 12.8 oz (76.6 kg)  06/23/17 169 lb (76.7 kg)  06/04/17 171 lb (77.6 kg)     Other studies Reviewed: Additional studies/ records that were reviewed today include: . Review of the above records demonstrates:    Assessment and Plan:   1. SVT: She is known to have a long RP tachycardia. She has rare palpitations. Continue Cardizem.    2. Mitral regurgitation: Trace MR by echo December 2017.    3. HTN: BP is controlled. No changes  4. History of PE: ***? On Xarelto still?  Current medicines are reviewed at length with the patient today.  The patient does not have concerns regarding medicines.  The following changes have been made:  no change  Labs/ tests ordered today include:   No orders of the defined types were placed in this encounter.   Disposition:   FU with me in 24 months  Signed, Lauree Chandler, MD 11/11/2017 8:00 AM    [address], [place], [place]  [address] Phone: [phone]; Fax: [phone]

## 2017-11-19 DIAGNOSIS — L719 Rosacea, unspecified: Secondary | ICD-10-CM | POA: Diagnosis not present

## 2017-11-19 DIAGNOSIS — D224 Melanocytic nevi of scalp and neck: Secondary | ICD-10-CM | POA: Diagnosis not present

## 2017-11-19 DIAGNOSIS — D485 Neoplasm of uncertain behavior of skin: Secondary | ICD-10-CM | POA: Diagnosis not present

## 2017-11-19 DIAGNOSIS — L905 Scar conditions and fibrosis of skin: Secondary | ICD-10-CM | POA: Diagnosis not present

## 2017-11-19 DIAGNOSIS — Z1283 Encounter for screening for malignant neoplasm of skin: Secondary | ICD-10-CM | POA: Diagnosis not present

## 2017-11-19 DIAGNOSIS — D235 Other benign neoplasm of skin of trunk: Secondary | ICD-10-CM | POA: Diagnosis not present

## 2017-11-19 DIAGNOSIS — L700 Acne vulgaris: Secondary | ICD-10-CM | POA: Diagnosis not present

## 2017-11-19 DIAGNOSIS — L812 Freckles: Secondary | ICD-10-CM | POA: Diagnosis not present

## 2017-11-19 DIAGNOSIS — Z808 Family history of malignant neoplasm of other organs or systems: Secondary | ICD-10-CM | POA: Diagnosis not present

## 2017-11-19 DIAGNOSIS — L820 Inflamed seborrheic keratosis: Secondary | ICD-10-CM | POA: Diagnosis not present

## 2017-11-23 ENCOUNTER — Other Ambulatory Visit: Payer: Self-pay | Admitting: Family Medicine

## 2017-11-23 DIAGNOSIS — I10 Essential (primary) hypertension: Secondary | ICD-10-CM

## 2017-11-23 MED FILL — CARTIA XT 180 MG CAPSULE SA: 180 | 90 days supply | Qty: 90 | Fill #0

## 2017-11-23 MED FILL — TRETINOIN 0.025% CREAM: 0.025 | 15 days supply | Qty: 45 | Fill #0

## 2017-11-26 ENCOUNTER — Encounter: Payer: Self-pay | Admitting: Family Medicine

## 2017-11-26 ENCOUNTER — Ambulatory Visit (INDEPENDENT_AMBULATORY_CARE_PROVIDER_SITE_OTHER): Payer: 59 | Admitting: Family Medicine

## 2017-11-26 VITALS — BP 116/84 | HR 84 | Temp 97.7°F | Resp 16 | Ht 69.0 in | Wt 162.2 lb

## 2017-11-26 DIAGNOSIS — R0600 Dyspnea, unspecified: Secondary | ICD-10-CM

## 2017-11-26 DIAGNOSIS — Z0000 Encounter for general adult medical examination without abnormal findings: Secondary | ICD-10-CM | POA: Diagnosis not present

## 2017-11-26 DIAGNOSIS — E039 Hypothyroidism, unspecified: Secondary | ICD-10-CM

## 2017-11-26 DIAGNOSIS — I10 Essential (primary) hypertension: Secondary | ICD-10-CM

## 2017-11-26 DIAGNOSIS — Z86711 Personal history of pulmonary embolism: Secondary | ICD-10-CM | POA: Diagnosis not present

## 2017-11-26 DIAGNOSIS — R0609 Other forms of dyspnea: Secondary | ICD-10-CM

## 2017-11-26 DIAGNOSIS — F332 Major depressive disorder, recurrent severe without psychotic features: Secondary | ICD-10-CM

## 2017-11-26 DIAGNOSIS — I471 Supraventricular tachycardia, unspecified: Secondary | ICD-10-CM

## 2017-11-26 DIAGNOSIS — E785 Hyperlipidemia, unspecified: Secondary | ICD-10-CM

## 2017-11-26 MED ORDER — BUDESONIDE-FORMOTEROL FUMARATE 80-4.5 MCG/ACT IN AERO: 2.0000 | INHALATION_SPRAY | Freq: Two times a day (BID) | RESPIRATORY_TRACT | 3 refills | Status: DC

## 2017-11-26 MED FILL — SYMBICORT 80-4.5 MCG INH: 80-4.5 | 30 days supply | Qty: 10 | Fill #0

## 2017-11-26 NOTE — Progress Notes (Signed)
Subjective:     Beverly Cole is a 57 y.o. female and is here for a comprehensive physical exam. The patient reports problems - neck pain since changing pillows--- she did change back but pain is still there in base of neck and shoulders.  .  Social History   Socioeconomic History  . Marital status: Married    Spouse name: Not on file  . Number of children: 0  . Years of education: Not on file  . Highest education level: Not on file  Occupational History  . Occupation: Nurse-Personal Care/HH    Employer: UNEMPLOYED  Social Needs  . Financial resource strain: Not on file  . Food insecurity:    Worry: Not on file    Inability: Not on file  . Transportation needs:    Medical: Not on file    Non-medical: Not on file  Tobacco Use  . Smoking status: Former Smoker    Packs/day: 1.00    Years: 25.00    Pack years: 25.00    Types: Cigarettes    Last attempt to quit: 02/11/1996    Years since quitting: 21.8  . Smokeless tobacco: Never Used  . Tobacco comment: Married, lives with spouse. 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 Alcohol use: Yes    Comment: occ  . Drug use: No  . Sexual activity: Yes    Partners: Male    Birth control/protection: Post-menopausal  Lifestyle  . Physical activity:    Days per week: Not on file    Minutes per session: Not on file  . Stress: Not on file  Relationships  . Social connections:    Talks on phone: Not on file    Gets together: Not on file    Attends religious service: Not on file    Active member of club or organization: Not on file    Attends meetings of clubs or organizations: Not on file    Relationship status: Not on file  . Intimate partner violence:    Fear of current or ex partner: Not on file    Emotionally abused: Not on file    Physically abused: Not on file    Forced sexual activity: Not on file  Other Topics Concern  . Not on file  Social History Narrative   Married, lives with  spouse in [place]. 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 MAMMOGRAM  11/12/2017  . PAP SMEAR  07/02/2020  . TETANUS/TDAP  05/12/2021  . COLONOSCOPY  11/27/2025  . INFLUENZA VACCINE  Completed  . Hepatitis C Screening  Completed  . HIV Screening  Completed    The following portions of the patient's history were reviewed and updated as appropriate:  She  has a past medical history of Anxiety, Atrial fibrillation ([place]), BIPOLAR DISORDER UNSPECIFIED, Bradycardia, Bursitis of hip (09/1999), DEPRESSION, Emphysema lung ([place]) (06/20/2016), GERD, Headache(784.0), HYPERLIPIDEMIA, Hypertension, Hypertension, IRRITABLE BOWEL SYNDROME, HX OF, Menopausal state (01/2012), Mitral valve prolapse (12/17/2000), MYALGIA, Normal coronary arteries, PAT (paroxysmal atrial tachycardia) ([place]), PE (pulmonary embolism) (02/09/2015), Premature atrial contractions, Pulmonary embolus ([place]) (02/10/2015), Shortness of breath, Thyroid nodule (2014), and Tremors of nervous system. She does not have any pertinent problems on file. She  has a past surgical history that includes Lipoma (R) side (1990); Colonoscopy; laparoscopic appendectomy (N/A, 03/17/2017);  and Appendectomy. Her family history includes Cancer in her brother and father; Colon cancer in her paternal uncle; Depression in her father; Hyperlipidemia in her father and mother; Hypertension in her mother; Lung cancer in her mother; Pulmonary embolism in her brother; Sarcoidosis in her other; Testicular cancer in her other. She  reports that she quit smoking about 21 years ago. Her smoking use included cigarettes. She has a 25.00 pack-year smoking history. She has never used smokeless tobacco. She reports that she drinks alcohol. She reports that she does not use drugs. She has a current medication list which includes the following prescription(s): alprazolam, aspirin ec,  bupropion, cartia xt, divalproex, fluticasone, ketoconazole, probiotic product, propranolol, sertraline, and budesonide-formoterol. Current Outpatient Medications on File Prior to Visit  Medication Sig Dispense Refill  . ALPRAZolam (XANAX) 0.25 MG tablet TAKE 1 TO 2 TABLETS BY MOUTH TWICE DAILY AS NEEDED FOR ANXIETY  1  . aspirin EC 325 MG tablet Take 325 mg by mouth daily.    Marland Kitchen buPROPion (WELLBUTRIN SR) 200 MG 12 hr tablet TAKE 1 TABLET BY MOUTH DAILY IN THE MORNING  0  . CARTIA XT 180 MG 24 hr capsule TAKE 1 CAPSULE BY MOUTH DAILY. 90 capsule 1  . divalproex (DEPAKOTE ER) 500 MG 24 hr tablet Take 1,000 mg by mouth at bedtime.  0  . fluticasone (FLONASE) 50 MCG/ACT nasal spray Place 2 sprays into the nose daily as needed. For allergies    . ketoconazole (NIZORAL) 2 % cream Apply 1 application topically daily. 60 g 3  . Probiotic Product (PROBIOTIC DAILY PO) Take 1 tablet by mouth daily.     . propranolol (INDERAL) 10 MG tablet Take 20 mg by mouth daily as needed (shakiness). Reported on 05/17/2015  1  . sertraline (ZOLOFT) 50 MG tablet Take 50 mg by mouth daily.  1   No current facility-administered medications on file prior to visit.    She is allergic to cariprazine; lopressor [metoprolol]; nickel; and ciprocin-fluocin-procin [fluocinolone]..  Review of Systems Review of Systems  Constitutional: Negative for activity change, appetite change and fatigue.  HENT: Negative for hearing loss, congestion, tinnitus and ear discharge.  dentist q103m Eyes: Negative for visual disturbance (see optho q1y -- vision corrected to 20/20 with glasses).  Respiratory: Negative for cough, chest tightness and shortness of breath.   Cardiovascular: Negative for chest pain, palpitations and leg swelling.  Gastrointestinal: Negative for abdominal pain, diarrhea, constipation and abdominal distention.  Genitourinary: Negative for urgency, frequency, decreased urine volume and difficulty urinating.  Musculoskeletal:  Negative for back pain, arthralgias and gait problem.  Skin: Negative for color change, pallor and rash.  Neurological: Negative for dizziness, light-headedness, numbness and headaches.  Hematological: Negative for adenopathy. Does not bruise/bleed easily.  Psychiatric/Behavioral: Negative for suicidal ideas, confusion, sleep disturbance, self-injury, dysphoric mood, decreased concentration and agitation.      Objective:    BP 116/84 (BP Location: Right Arm, Cuff Size: Normal)   Pulse 84   Temp 97.7 F (36.5 C) (Oral)   Resp 16   Ht 5\' 9"  (1.753 m)   Wt 162 lb 3.2 oz (73.6 kg)   LMP 05/11/2012   SpO2 97%   BMI 23.95 kg/m  General appearance: alert, cooperative, appears stated age and no distress Head: Normocephalic, without obvious abnormality, atraumatic Eyes: conjunctivae/corneas clear. PERRL, EOM's intact. Fundi benign. Ears: normal TM's and external ear canals both ears Nose: Nares normal. Septum midline. Mucosa normal. No drainage or sinus tenderness. Throat: lips, mucosa, and  tongue normal; teeth and gums normal Neck: no adenopathy, no carotid bruit, no JVD, supple, symmetrical, trachea midline and thyroid not enlarged, symmetric, no tenderness/mass/nodules Back: symmetric, no curvature. ROM normal. No CVA tenderness. Lungs: clear to auscultation bilaterally Breasts: gyn Heart: regular rate and rhythm, S1, S2 normal, no murmur, click, rub or gallop Abdomen: soft, non-tender; bowel sounds normal; no masses,  no organomegaly Pelvic: deferred--gyn Extremities: extremities normal, atraumatic, no cyanosis or edema Pulses: 2+ and symmetric Skin: Skin color, texture, turgor normal. No rashes or lesions Lymph nodes: Cervical, supraclavicular, and axillary nodes normal. Neurologic: Alert and oriented X 3, normal strength and tone. Normal symmetric reflexes. Normal coordination and gait    Assessment:    Healthy female exam.      Plan:  Check labs    ghm utd See After  Visit Summary for Counseling Recommendations    .1. Preventative health care See above - Comprehensive metabolic panel - CBC with Differential/Platelet - TSH - Lipid panel  2. Hyperlipidemia, unspecified hyperlipidemia type Encouraged heart healthy diet, increase exercise, avoid trans fats, consider a krill oil cap daily - Comprehensive metabolic panel - CBC with Differential/Platelet - TSH - Lipid panel  3. Essential hypertension Well controlled, no changes to meds. Encouraged heart healthy diet such as the DASH diet and exercise as tolerated.  - Comprehensive metabolic panel - CBC with Differential/Platelet - TSH - Lipid panel  4. Hypothyroidism, unspecified type Check labs  Stable--off meds - TSH  5. DOE (dyspnea on exertion) stable - budesonide-formoterol (SYMBICORT) 80-4.5 MCG/ACT inhaler; Inhale 2 puffs into the lungs 2 (two) times daily.  Dispense: 1 Inhaler; : 3  6. SVT (supraventricular tachycardia) [facility]) Per cardiology  7. Severe recurrent major depression without psychotic features [facility]) Per psych  8. Hyperlipidemia LDL goal <100 Encouraged heart healthy diet, increase exercise, avoid trans fats, consider a krill oil cap daily  9. History of pulmonary embolus - Dec 2017 Per hematology

## 2017-11-26 NOTE — Patient Instructions (Signed)
Preventive Care 40-64 Years, Female Preventive care refers to lifestyle choices and visits with your health care provider that can promote health and wellness. What does preventive care include?  A yearly physical exam. This is also called an annual well check.  Dental exams once or twice a year.  Routine eye exams. Ask your health care provider how often you should have your eyes checked.  Personal lifestyle choices, including: ? Daily care of your teeth and gums. ? Regular physical activity. ? Eating a healthy diet. ? Avoiding tobacco and drug use. ? Limiting alcohol use. ? Practicing safe sex. ? Taking low-dose aspirin daily starting at age 58. ? Taking vitamin and mineral supplements as recommended by your health care provider. What happens during an annual well check? The services and screenings done by your health care provider during your annual well check will depend on your age, overall health, lifestyle risk factors, and family history of disease. Counseling Your health care provider may ask you questions about your:  Alcohol use.  Tobacco use.  Drug use.  Emotional well-being.  Home and relationship well-being.  Sexual activity.  Eating habits.  Work and work Statistician.  Method of birth control.  Menstrual cycle.  Pregnancy history.  Screening You may have the following tests or measurements:  Height, weight, and BMI.  Blood pressure.  Lipid and cholesterol levels. These may be checked every 5 years, or more frequently if you are over 81 years old.  Skin check.  Lung cancer screening. You may have this screening every year starting at age 78 if you have a 30-pack-year history of smoking and currently smoke or have quit within the past 15 years.  Fecal occult blood test (FOBT) of the stool. You may have this test every year starting at age 65.  Flexible sigmoidoscopy or colonoscopy. You may have a sigmoidoscopy every 5 years or a colonoscopy  every 10 years starting at age 30.  Hepatitis C blood test.  Hepatitis B blood test.  Sexually transmitted disease (STD) testing.  Diabetes screening. This is done by checking your blood sugar (glucose) after you have not eaten for a while (fasting). You may have this done every 1-3 years.  Mammogram. This may be done every 1-2 years. Talk to your health care provider about when you should start having regular mammograms. This may depend on whether you have a family history of breast cancer.  BRCA-related cancer screening. This may be done if you have a family history of breast, ovarian, tubal, or peritoneal cancers.  Pelvic exam and Pap test. This may be done every 3 years starting at age 80. Starting at age 36, this may be done every 5 years if you have a Pap test in combination with an HPV test.  Bone density scan. This is done to screen for osteoporosis. You may have this scan if you are at high risk for osteoporosis.  Discuss your test results, treatment options, and if necessary, the need for more tests with your health care provider. Vaccines Your health care provider may recommend certain vaccines, such as:  Influenza vaccine. This is recommended every year.  Tetanus, diphtheria, and acellular pertussis (Tdap, Td) vaccine. You may need a Td booster every 10 years.  Varicella vaccine. You may need this if you have not been vaccinated.  Zoster vaccine. You may need this after age 5.  Measles, mumps, and rubella (MMR) vaccine. You may need at least one dose of MMR if you were born in  1957 or later. You may also need a second dose.  Pneumococcal 13-valent conjugate (PCV13) vaccine. You may need this if you have certain conditions and were not previously vaccinated.  Pneumococcal polysaccharide (PPSV23) vaccine. You may need one or two doses if you smoke cigarettes or if you have certain conditions.  Meningococcal vaccine. You may need this if you have certain  conditions.  Hepatitis A vaccine. You may need this if you have certain conditions or if you travel or work in places where you may be exposed to hepatitis A.  Hepatitis B vaccine. You may need this if you have certain conditions or if you travel or work in places where you may be exposed to hepatitis B.  Haemophilus influenzae type b (Hib) vaccine. You may need this if you have certain conditions.  Talk to your health care provider about which screenings and vaccines you need and how often you need them. This information is not intended to replace advice given to you by your health care provider. Make sure you discuss any questions you have with your health care provider. Document Released: 02/23/2015 Document Revised: 10/17/2015 Document Reviewed: 11/28/2014 Elsevier Interactive Patient Education  2018 Elsevier Inc.  

## 2017-11-27 LAB — CBC WITH DIFFERENTIAL/PLATELET
Basophils Absolute: 0.1 10*3/uL (ref 0.0–0.1)
Basophils Relative: 0.8 % (ref 0.0–3.0)
Eosinophils Absolute: 0.1 10*3/uL (ref 0.0–0.7)
Eosinophils Relative: 1.4 % (ref 0.0–5.0)
HCT: 41.5 % (ref 36.0–46.0)
Hemoglobin: 14 g/dL (ref 12.0–15.0)
Lymphocytes Relative: 41.1 % (ref 12.0–46.0)
Lymphs Abs: 3.4 10*3/uL (ref 0.7–4.0)
MCHC: 33.7 g/dL (ref 30.0–36.0)
MCV: 87.7 fl (ref 78.0–100.0)
Monocytes Absolute: 0.6 10*3/uL (ref 0.1–1.0)
Monocytes Relative: 7.4 % (ref 3.0–12.0)
Neutro Abs: 4.1 10*3/uL (ref 1.4–7.7)
Neutrophils Relative %: 49.3 % (ref 43.0–77.0)
Platelets: 290 10*3/uL (ref 150.0–400.0)
RBC: 4.73 Mil/uL (ref 3.87–5.11)
RDW: 15.4 % (ref 11.5–15.5)
WBC: 8.3 10*3/uL (ref 4.0–10.5)

## 2017-11-27 LAB — LIPID PANEL
Cholesterol: 237 mg/dL — ABNORMAL HIGH (ref 0–200)
HDL: 44.8 mg/dL (ref >39.00)
LDL Cholesterol: 154 mg/dL — ABNORMAL HIGH (ref 0–99)
NonHDL: 191.7
Total CHOL/HDL Ratio: 5
Triglycerides: 189 mg/dL — ABNORMAL HIGH (ref 0.0–149.0)
VLDL: 37.8 mg/dL (ref 0.0–40.0)

## 2017-11-27 LAB — COMPREHENSIVE METABOLIC PANEL
ALT: 20 U/L (ref 0–35)
AST: 20 U/L (ref 0–37)
Albumin: 4.5 g/dL (ref 3.5–5.2)
Alkaline Phosphatase: 56 U/L (ref 39–117)
BUN: 13 mg/dL (ref 6–23)
CO2: 27 mEq/L (ref 19–32)
Calcium: 9.5 mg/dL (ref 8.4–10.5)
Chloride: 103 mEq/L (ref 96–112)
Creatinine, Ser: 1.02 mg/dL (ref 0.40–1.20)
GFR: 59.16 mL/min — ABNORMAL LOW (ref >60.00)
Glucose, Bld: 75 mg/dL (ref 70–99)
Potassium: 3.7 mEq/L (ref 3.5–5.1)
Sodium: 141 mEq/L (ref 135–145)
Total Bilirubin: 0.4 mg/dL (ref 0.2–1.2)
Total Protein: 7.2 g/dL (ref 6.0–8.3)

## 2017-11-27 LAB — TSH: TSH: 2.99 u[IU]/mL (ref 0.35–4.50)

## 2017-11-29 NOTE — Assessment & Plan Note (Signed)
Per hematology 

## 2017-11-29 NOTE — Assessment & Plan Note (Signed)
Encouraged heart healthy diet, increase exercise, avoid trans fats, consider a krill oil cap daily 

## 2017-11-29 NOTE — Assessment & Plan Note (Signed)
Well controlled, no changes to meds. Encouraged heart healthy diet such as the DASH diet and exercise as tolerated.  °

## 2017-11-29 NOTE — Assessment & Plan Note (Signed)
Check labs 

## 2017-11-29 NOTE — Assessment & Plan Note (Signed)
Per cardiology 

## 2017-11-29 NOTE — Assessment & Plan Note (Signed)
Per psych 

## 2017-12-01 MED FILL — DEXILANT DR 60 MG CAPSULE: 60 | 30 days supply | Qty: 30 | Fill #7

## 2017-12-01 MED FILL — BUPROPION HCL SR 200 MG TAB: 200 | 90 days supply | Qty: 90 | Fill #1

## 2017-12-02 DIAGNOSIS — L820 Inflamed seborrheic keratosis: Secondary | ICD-10-CM | POA: Diagnosis not present

## 2017-12-02 DIAGNOSIS — L905 Scar conditions and fibrosis of skin: Secondary | ICD-10-CM | POA: Diagnosis not present

## 2017-12-02 NOTE — Addendum Note (Signed)
Addended by: Damita Dunnings D on: 12/02/2017 11:49 AM   Modules accepted: Orders

## 2017-12-03 ENCOUNTER — Inpatient Hospital Stay: Payer: 59

## 2017-12-03 ENCOUNTER — Encounter: Payer: Self-pay | Admitting: Family

## 2017-12-03 ENCOUNTER — Inpatient Hospital Stay: Payer: 59 | Attending: Family | Admitting: Family

## 2017-12-03 ENCOUNTER — Other Ambulatory Visit: Payer: Self-pay

## 2017-12-03 VITALS — BP 132/59 | HR 76 | Temp 98.2°F | Resp 17 | Wt 163.1 lb

## 2017-12-03 DIAGNOSIS — Z7982 Long term (current) use of aspirin: Secondary | ICD-10-CM | POA: Diagnosis not present

## 2017-12-03 DIAGNOSIS — I2782 Chronic pulmonary embolism: Secondary | ICD-10-CM

## 2017-12-03 DIAGNOSIS — Z87891 Personal history of nicotine dependence: Secondary | ICD-10-CM | POA: Diagnosis not present

## 2017-12-03 DIAGNOSIS — D6859 Other primary thrombophilia: Secondary | ICD-10-CM

## 2017-12-03 DIAGNOSIS — Z86711 Personal history of pulmonary embolism: Secondary | ICD-10-CM

## 2017-12-03 DIAGNOSIS — I2602 Saddle embolus of pulmonary artery with acute cor pulmonale: Secondary | ICD-10-CM

## 2017-12-03 DIAGNOSIS — R760 Raised antibody titer: Secondary | ICD-10-CM

## 2017-12-03 DIAGNOSIS — Z23 Encounter for immunization: Secondary | ICD-10-CM

## 2017-12-03 LAB — CBC WITH DIFFERENTIAL (CANCER CENTER ONLY)
Abs Immature Granulocytes: 0.02 10*3/uL (ref 0.00–0.07)
Basophils Absolute: 0 10*3/uL (ref 0.0–0.1)
Basophils Relative: 1 %
Eosinophils Absolute: 0.1 10*3/uL (ref 0.0–0.5)
Eosinophils Relative: 1 %
HCT: 42.2 % (ref 36.0–46.0)
Hemoglobin: 13.4 g/dL (ref 12.0–15.0)
Immature Granulocytes: 0 %
Lymphocytes Relative: 36 %
Lymphs Abs: 2 10*3/uL (ref 0.7–4.0)
MCH: 28 pg (ref 26.0–34.0)
MCHC: 31.8 g/dL (ref 30.0–36.0)
MCV: 88.3 fL (ref 80.0–100.0)
Monocytes Absolute: 0.3 10*3/uL (ref 0.1–1.0)
Monocytes Relative: 5 %
Neutro Abs: 3.2 10*3/uL (ref 1.7–7.7)
Neutrophils Relative %: 57 %
Platelet Count: 197 10*3/uL (ref 150–400)
RBC: 4.78 MIL/uL (ref 3.87–5.11)
RDW: 14.6 % (ref 11.5–15.5)
WBC Count: 5.6 10*3/uL (ref 4.0–10.5)
nRBC: 0 % (ref 0.0–0.2)

## 2017-12-03 LAB — CMP (CANCER CENTER ONLY)
ALT: 26 U/L (ref 10–47)
AST: 26 U/L (ref 11–38)
Albumin: 3.7 g/dL (ref 3.5–5.0)
Alkaline Phosphatase: 59 U/L (ref 26–84)
Anion gap: 1 — ABNORMAL LOW (ref 5–15)
BUN: 8 mg/dL (ref 7–22)
CO2: 23 mmol/L (ref 18–33)
Calcium: 9.1 mg/dL (ref 8.0–10.3)
Chloride: 114 mmol/L — ABNORMAL HIGH (ref 98–108)
Creatinine: 1.1 mg/dL (ref 0.60–1.20)
Glucose, Bld: 128 mg/dL — ABNORMAL HIGH (ref 73–118)
Potassium: 3.3 mmol/L (ref 3.3–4.7)
Sodium: 138 mmol/L (ref 128–145)
Total Bilirubin: 0.5 mg/dL (ref 0.2–1.6)
Total Protein: 7.2 g/dL (ref 6.4–8.1)

## 2017-12-03 NOTE — Progress Notes (Signed)
Hematology and Oncology Follow Up Visit  Beverly Cole [id number] 11/18/1960 57 y.o. 12/03/2017   Principle Diagnosis:  Bilateral pulmonary embolism Positive lupus anticoagulant Mildly depressed protein S level  Past Therapy: Xarelto 20 mg by mouth daily-finish 1 year of therapy in December 2017 Xarelto 10 mg by mouth daily-to complete 1 year in December 2018- d/c on 01/21/2017  Current Therapy:   EC ASA325mg  po q day - started on 01/21/2017   Interim History:  Beverly Cole is here today for follow-up. She continues to do well and has no complaints at this time.  She is tolerating full dose aspirin PO daily nicely. No episodes of bleeding. She does bruise easily but not in excess.  No fever, chills, n/v, cough, rash, dizziness, chest pain, abdominal pain or changes in bowel or bladder habits.  She states that she has had occasional palpitations for years and that this seems to have improved.  She has seen a pulmonologist for her SOB and states that she was diagnosed with early emphysema. She quit smoking 20 years ago.  No swelling, tenderness, numbness or tingling in her extremities. No c/o pain at this time.  No lymphadenopathy noted on exam.  She has a benign familial tremor.  She states that she has a fear of falling a will walk with a cane at times for support.  She has maintained a good appetite and is staying well hydrated. Her weight is stable.   ECOG Performance Status: 1 - Symptomatic but completely ambulatory  Medications:  Allergies as of 12/03/2017      Reactions   Cariprazine Other (See Comments)   Patient becomes suicidal when taking this medication.  Patient becomes suicidal when taking this medication.    Metoprolol Other (See Comments)   Hallucinations hair falls out Hallucinations hair falls out   Nickel Rash   Ciprocin-fluocin-procin [fluocinolone] Nausea Only      Medication List        Accurate as of 12/03/17  8:39 PM. Always use your most recent  med list.          ALPRAZolam 0.25 MG tablet Commonly known as:  XANAX TAKE 1 TO 2 TABLETS BY MOUTH TWICE DAILY AS NEEDED FOR ANXIETY   aspirin EC 325 MG tablet Take 325 mg by mouth daily.   budesonide-formoterol 80-4.5 MCG/ACT inhaler Commonly known as:  SYMBICORT Inhale 2 puffs into the lungs 2 (two) times daily.   buPROPion 200 MG 12 hr tablet Commonly known as:  WELLBUTRIN SR TAKE 1 TABLET BY MOUTH DAILY IN THE MORNING   CARTIA XT 180 MG 24 hr capsule Generic drug:  diltiazem TAKE 1 CAPSULE BY MOUTH DAILY.   DEXILANT 60 MG capsule Generic drug:  dexlansoprazole Take 60 mg by mouth daily.   divalproex 500 MG 24 hr tablet Commonly known as:  DEPAKOTE ER Take 1,000 mg by mouth at bedtime.   fluticasone 50 MCG/ACT nasal spray Commonly known as:  FLONASE Place 2 sprays into the nose daily as needed. For allergies   ketoconazole 2 % cream Commonly known as:  NIZORAL Apply 1 application topically daily.   pramipexole 0.25 MG tablet Commonly known as:  MIRAPEX Take 0.25 mg by mouth as needed.   PROBIOTIC DAILY PO Take 1 tablet by mouth daily.   sertraline 50 MG tablet Commonly known as:  ZOLOFT Take 50 mg by mouth daily.   tretinoin 0.025 % cream Commonly known as:  RETIN-A       Allergies:  Allergies  Allergen Reactions  . Cariprazine Other (See Comments)    Patient becomes suicidal when taking this medication.  Patient becomes suicidal when taking this medication.   . Metoprolol Other (See Comments)    Hallucinations hair falls out Hallucinations hair falls out  . Nickel Rash  . Ciprocin-Fluocin-Procin [Fluocinolone] Nausea Only    Past Medical History, Surgical history, Social history, and Family History were reviewed and updated.  Review of Systems: All other 10 point review of systems is negative.   Physical Exam:  weight is 163 lb 1.9 oz (74 kg). Her oral temperature is 98.2 F (36.8 C). Her blood pressure is 132/59 (abnormal) and her  pulse is 76. Her respiration is 17 and oxygen saturation is 97%.   Wt Readings from Last 3 Encounters:  12/03/17 163 lb 1.9 oz (74 kg)  11/26/17 162 lb 3.2 oz (73.6 kg)  07/02/17 168 lb 12.8 oz (76.6 kg)    Ocular: Sclerae unicteric, pupils equal, round and reactive to light Ear-nose-throat: Oropharynx clear, dentition fair Lymphatic: No cervical, supraclavicular or axillary adenopathy Lungs no rales or rhonchi, good excursion bilaterally Heart regular rate and rhythm, no murmur appreciated Abd soft, nontender, positive bowel sounds, no liver or spleen tip palpated on exam, no fluid wave  MSK no focal spinal tenderness, no joint edema Neuro: non-focal, well-oriented, appropriate affect Breasts: Deferred   Lab Results  Component Value Date   WBC 5.6 12/03/2017   HGB 13.4 12/03/2017   HCT 42.2 12/03/2017   MCV 88.3 12/03/2017   PLT 197 12/03/2017   No results found for: FERRITIN, IRON, TIBC, UIBC, IRONPCTSAT Lab Results  Component Value Date   RBC 4.78 12/03/2017   No results found for: KPAFRELGTCHN, LAMBDASER, KAPLAMBRATIO No results found for: Kandis Cocking, IGMSERUM No results found for: Odetta Pink, SPEI   Chemistry      Component Value Date/Time   NA 138 12/03/2017 1408   NA 142 01/21/2017 1013   NA 143 01/16/2016 1005   K 3.3 12/03/2017 1408   K 3.2 (L) 01/21/2017 1013   K 3.3 (L) 01/16/2016 1005   CL 114 (H) 12/03/2017 1408   CL 106 01/21/2017 1013   CO2 23 12/03/2017 1408   CO2 29 01/21/2017 1013   CO2 20 (L) 01/16/2016 1005   BUN 8 12/03/2017 1408   BUN 9 01/21/2017 1013   BUN 12.6 01/16/2016 1005   CREATININE 1.10 12/03/2017 1408   CREATININE 0.9 01/21/2017 1013   CREATININE 0.9 01/16/2016 1005      Component Value Date/Time   CALCIUM 9.1 12/03/2017 1408   CALCIUM 9.2 01/21/2017 1013   CALCIUM 9.5 01/16/2016 1005   ALKPHOS 59 12/03/2017 1408   ALKPHOS 62 01/21/2017 1013   ALKPHOS 81 01/16/2016  1005   AST 26 12/03/2017 1408   AST 16 01/16/2016 1005   ALT 26 12/03/2017 1408   ALT 22 01/21/2017 1013   ALT 24 01/16/2016 1005   BILITOT 0.5 12/03/2017 1408   BILITOT 0.59 01/16/2016 1005       Impression and Plan: Beverly Cole is a very pleasant 57 yo caucasian female with history of bilateral pulmonary embolism while on hormonal replacement therapy. She also had a slightly low protein S level and positive lupus anticoagulant.  She is now on full dose aspirin daily and doing well. So far, there has been no evidence of recurrence.  We will plan to see her in another year per her preference. She will contact  our office with any questions or concerns. We can certainly see her sooner if need be.   Laverna Peace, NP 10/24/20198:39 PM

## 2017-12-04 LAB — PROTEIN S, TOTAL: Protein S Ag, Total: 142 % (ref 60–150)

## 2017-12-04 LAB — LUPUS ANTICOAGULANT PANEL
DRVVT: 42.5 s (ref 0.0–47.0)
PTT Lupus Anticoagulant: 30.3 s (ref 0.0–51.9)

## 2017-12-04 LAB — PROTEIN S ACTIVITY: Protein S Activity: 133 % (ref 63–140)

## 2017-12-08 ENCOUNTER — Ambulatory Visit: Payer: 59 | Admitting: Psychology

## 2017-12-24 ENCOUNTER — Ambulatory Visit (INDEPENDENT_AMBULATORY_CARE_PROVIDER_SITE_OTHER): Payer: 59 | Admitting: Psychology

## 2017-12-24 DIAGNOSIS — F331 Major depressive disorder, recurrent, moderate: Secondary | ICD-10-CM | POA: Diagnosis not present

## 2017-12-29 ENCOUNTER — Telehealth: Payer: Self-pay | Admitting: *Deleted

## 2017-12-29 NOTE — Telephone Encounter (Signed)
Patient sent a letter over asking to get records from chiropractor Dr. Deatra Robinson and that she has not started her inhaler yet because she is afraid of getting thrush.  Advised that she come by to sign a release of records and for the inhaler Dr. Etter Sjogren stated to make sure she brushes her tongue and rinse her mouth out after each use she should be fine.

## 2017-12-31 MED FILL — DEXILANT DR 60 MG CAPSULE: 60 | 30 days supply | Qty: 30 | Fill #8

## 2017-12-31 MED FILL — DIVALPROEX SOD ER 500 MG TA: 500 | 90 days supply | Qty: 180 | Fill #1

## 2018-01-06 ENCOUNTER — Encounter: Payer: Self-pay | Admitting: Emergency Medicine

## 2018-01-11 ENCOUNTER — Ambulatory Visit (INDEPENDENT_AMBULATORY_CARE_PROVIDER_SITE_OTHER): Payer: 59 | Admitting: Cardiovascular Disease

## 2018-01-11 ENCOUNTER — Encounter: Payer: Self-pay | Admitting: Cardiovascular Disease

## 2018-01-11 VITALS — BP 120/82 | HR 70 | Ht 69.0 in | Wt 162.0 lb

## 2018-01-11 DIAGNOSIS — I10 Essential (primary) hypertension: Secondary | ICD-10-CM | POA: Diagnosis not present

## 2018-01-11 DIAGNOSIS — I471 Supraventricular tachycardia: Secondary | ICD-10-CM | POA: Diagnosis not present

## 2018-01-11 DIAGNOSIS — I340 Nonrheumatic mitral (valve) insufficiency: Secondary | ICD-10-CM | POA: Diagnosis not present

## 2018-01-11 NOTE — Progress Notes (Signed)
Chief Complaint  Patient presents with  . Follow-up    SVT    History of Present Illness: 57 yo female with history of hyperlipidiemia, HTN, Bipolar disorder, IBS, Mitral valve prolapse, bilateral PE (December 2016) here today for cardiac follow up. She was seen as a new patient 10/08/11 to establish cardiology care. She was diagnosed with MVP in the 1980s. At her first visit here in 2013 she c/o sharp chest pains. She was seen in the ED at [facility] 08/11/11 and had a coronary CT 08/12/11 which showed normal coronary arteries. Also notes skipped heart beats several times per week. Echo in 2013 showed normal LV size and function, mild MR. A 48 hour Holter monitor showed NSR with bradycardia, PACs and runs of SVT with short run of atrial fibrillation. She was seen in EP clinic by Dr. Rayann Heman October 2013 and he felt that this was most likely a long RP tachycardia. He did not recommend a change in therapy but suggested Flecainide if symptoms worsened and EP f/u only if there were changes. She was last seen in EP clinic in January 2014. She felt that her Toprol was causing hallucinations so she was started on Cardizem and Toprol stopped November 2014. She was found to have bilateral PE in December 2016 and has been followed in Oncology. She has been treated with Xarelto. She was seen in our office July 2017 by Melina Copa, PA-C following an ED visit for chest pain. Troponin negative. Reportedly had PVCs on the monitor in the ED. Chest CTA without evidence of recurrent PE. Nuclear stress test August 2017 with no evidence of ischemia. LVEF=69%. Echo December 2017 with EGBT=51-76%, grade 1 diastolic dysfunction. No valve disease.   She is here today for follow up. The patient denies any chest pain, dyspnea, palpitations, lower extremity edema, orthopnea, PND, dizziness, near syncope or syncope.   Primary Care Physician: Carollee Herter, Alferd Apa, DO   Past Medical History:  Diagnosis Date  . Anxiety   . Atrial  fibrillation ([place])    a. Event monitor 2013 - PACs/bradycardia/SVT/short run of atrial fib by event monitor.   Marland Kitchen BIPOLAR DISORDER UNSPECIFIED   . Bradycardia   . Bursitis of hip 09/1999   Bilateral - Dr. Berenice Primas  . DEPRESSION   . Emphysema lung ([place]) 06/20/2016   pt states pulmonalongist stated started recently  . GERD   . Headache(784.0)    was with menstrual cycle. no longer a problem  . HYPERLIPIDEMIA   . Hypertension   . Hypertension   . IRRITABLE BOWEL SYNDROME, HX OF   . Menopausal state 01/2012   FSH = 88.5  . Mitral valve prolapse 12/17/2000   a. dx 1980s, most recent echo did not demonstrate this.  Marland Kitchen MYALGIA   . Normal coronary arteries    a. by cardiac CT 2013.  Marland Kitchen PAT (paroxysmal atrial tachycardia) ([place])   . PE (pulmonary embolism) 02/09/2015   a. Bilateral PEs 01/2015 when d-dimer 0.69, CP lying on left side. Followed by heme-onc - + lupus anticoagulant, mildly depressed protein S. Dr. Marin Olp is not certain if her hypercoagulable studies are significant for a thrombophilic state.  . Premature atrial contractions   . Pulmonary embolus ([place]) 02/10/2015  . Shortness of breath    Pulmonary eval 05/2002  . Thyroid nodule 2014  . Tremors of nervous system     Past Surgical History:  Procedure Laterality Date  . APPENDECTOMY    . COLONOSCOPY    .  LAPAROSCOPIC APPENDECTOMY N/A 03/17/2017   Procedure: APPENDECTOMY LAPAROSCOPIC;  Surgeon: Excell Seltzer, MD;  Location: [facility] ORS;  Service: General;  Laterality: N/A;  . Lipoma (R) side  1990   Right back    Current Outpatient Medications  Medication Sig Dispense Refill  . ALPRAZolam (XANAX) 0.25 MG tablet TAKE 1 TO 2 TABLETS BY MOUTH TWICE DAILY AS NEEDED FOR ANXIETY  1  . aspirin EC 325 MG tablet Take 325 mg by mouth daily.    . budesonide-formoterol (SYMBICORT) 80-4.5 MCG/ACT inhaler Inhale 2 puffs into the lungs 2 (two) times daily. 1 Inhaler 3  . buPROPion (WELLBUTRIN SR) 200 MG 12 hr tablet TAKE 1 TABLET BY MOUTH  DAILY IN THE MORNING  0  . CARTIA XT 180 MG 24 hr capsule TAKE 1 CAPSULE BY MOUTH DAILY. 90 capsule 1  . DEXILANT 60 MG capsule Take 60 mg by mouth daily.  12  . divalproex (DEPAKOTE ER) 500 MG 24 hr tablet Take 1,000 mg by mouth at bedtime.  0  . fluticasone (FLONASE) 50 MCG/ACT nasal spray Place 2 sprays into the nose daily as needed. For allergies    . ketoconazole (NIZORAL) 2 % cream Apply 1 application topically daily. 60 g 3  . Probiotic Product (PROBIOTIC DAILY PO) Take 1 tablet by mouth daily.     . sertraline (ZOLOFT) 50 MG tablet Take 50 mg by mouth daily.  1  . tretinoin (RETIN-A) 0.025 % cream   4   No current facility-administered medications for this visit.     Allergies  Allergen Reactions  . Cariprazine Other (See Comments)    Patient becomes suicidal when taking this medication.  Patient becomes suicidal when taking this medication.   . Metoprolol Other (See Comments)    Hallucinations hair falls out Hallucinations hair falls out  . Nickel Rash  . Ciprocin-Fluocin-Procin [Fluocinolone] Nausea Only    Social History   Socioeconomic History  . Marital status: Married    Spouse name: Not on file  . Number of children: 0  . Years of education: Not on file  . Highest education level: Not on file  Occupational History  . Occupation: Nurse-Personal Care/HH    Employer: UNEMPLOYED  Social Needs  . Financial resource strain: Not on file  . Food insecurity:    Worry: Not on file    Inability: Not on file  . Transportation needs:    Medical: Not on file    Non-medical: Not on file  Tobacco Use  . Smoking status: Former Smoker    Packs/day: 1.00    Years: 25.00    Pack years: 25.00    Types: Cigarettes    Last attempt to quit: 02/11/1996    Years since quitting: 21.9  . Smokeless tobacco: Never Used  . Tobacco comment: Married, lives with spouse. 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 Alcohol use: Yes    Comment: occ  .  Drug use: No  . Sexual activity: Yes    Partners: Male    Birth control/protection: Post-menopausal  Lifestyle  . Physical activity:    Days per week: Not on file    Minutes per session: Not on file  . Stress: Not on file  Relationships  . Social connections:    Talks on phone: Not on file    Gets together: Not on file    Attends religious service: Not on file    Active member of club or  organization: Not on file    Attends meetings of clubs or organizations: Not on file    Relationship status: Not on file  . Intimate partner violence:    Fear of current or ex partner: Not on file    Emotionally abused: Not on file    Physically abused: Not on file    Forced sexual activity: Not on file  Other Topics Concern  . Not on file  Social History Narrative   Married, lives with spouse in [place]. 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 Lung cancer Mother   . Hypertension Mother   . Hyperlipidemia Mother   . Cancer Father        Esophageal cancer  . Hyperlipidemia Father   . Depression Father   . Cancer Brother   . Pulmonary embolism Brother   . Colon cancer Paternal Uncle   . Sarcoidosis Other   . Testicular cancer Other     Review of Systems:  As stated in the HPI and otherwise negative.   BP 120/82   Pulse 70   Ht 5\' 9"  (1.753 m)   Wt 162 lb (73.5 kg)   LMP 05/11/2012   SpO2 98%   BMI 23.92 kg/m   Physical Examination: General: Well developed, well nourished, NAD  HEENT: OP clear, mucus membranes moist  SKIN: warm, dry. No rashes. Neuro: No focal deficits  Musculoskeletal: Muscle strength 5/5 all ext  Psychiatric: Mood and affect normal  Neck: No JVD, no carotid bruits, no thyromegaly, no lymphadenopathy.  Lungs:Clear bilaterally, no wheezes, rhonci, crackles Cardiovascular: Regular rate and rhythm. No murmurs, gallops or rubs. Abdomen:Soft. Bowel  sounds present. Non-tender.  Extremities: No lower extremity edema. Pulses are 2 + in the bilateral DP/PT.  Echo December 2017: Left ventricle: The cavity size was normal. Wall thickness was   normal. Systolic function was normal. The estimated ejection   fraction was in the range of 50% to 55%. Wall motion was normal;   there were no regional wall motion abnormalities. Doppler   parameters are consistent with abnormal left ventricular   relaxation (grade 1 diastolic dysfunction).  Impressions: - Normal LV systolic function; grade 1 diastolic dysfunction;   trace MR.  EKG:  EKG is ordered today. The ekg ordered today demonstrates NSR, rate 70 bpm. Non specific ST and T wave abn, unchanged.   Recent Labs: 11/26/2017: TSH 2.99 12/03/2017: ALT 26; BUN 8; Creatinine 1.10; Hemoglobin 13.4; Platelet Count 197; Potassium 3.3; Sodium 138   Lipid Panel    Component Value Date/Time   CHOL 237 (H) 11/26/2017 1451   TRIG 189.0 (H) 11/26/2017 1451   HDL 44.80 11/26/2017 1451   CHOLHDL 5 11/26/2017 1451   VLDL 37.8 11/26/2017 1451   LDLCALC 154 (H) 11/26/2017 1451   LDLDIRECT 186.0 05/26/2017 1445     Wt Readings from Last 3 Encounters:  01/11/18 162 lb (73.5 kg)  12/03/17 163 lb 1.9 oz (74 kg)  11/26/17 162 lb 3.2 oz (73.6 kg)     Other studies Reviewed: Additional studies/ records that were reviewed today include: . Review of the above records demonstrates:    Assessment and Plan:   1. SVT: She is known to have a long RP tachycardia. She has rare palpitations. Will continue Cardizem.    2. Mitral regurgitation: Trivial by echo in  December 2017.    3. HTN: BP is well controlled. No changes today  5. History of PE: She is now off of Xarelto. Followed by heme/onc clinic.   Current medicines are reviewed at length with the patient today.  The patient does not have concerns regarding medicines.  The following changes have been made:  no change  Labs/ tests ordered today  include:   Orders Placed This Encounter  Procedures  . EKG 12-Lead    Disposition:   FU with me in 12 months  Signed, Lauree Chandler, MD 01/11/2018 1:31 PM    [facility] [address], [place], [place]  [address] Phone: [phone]; Fax: [phone]

## 2018-01-11 NOTE — Patient Instructions (Signed)
Medication Instructions:  Your physician recommends that you continue on your current medications as directed. Please refer to the Current Medication list given to you today.  If you need a refill on your cardiac medications before your next appointment, please call your pharmacy.   Lab work: none If you have labs (blood work) drawn today and your tests are completely normal, you will receive your results only by: Marland Kitchen MyChart Message (if you have MyChart) OR . A paper copy in the mail If you have any lab test that is abnormal or we need to change your treatment, we will call you to review the results.  Testing/Procedures: none  Follow-Up: At [facility], you and your health needs are our priority.  As part of our continuing mission to provide you with exceptional heart care, we have created designated Provider Care Teams.  These Care Teams include your primary Cardiologist (physician) and Advanced Practice Providers (APPs -  Physician Assistants and Nurse Practitioners) who all work together to provide you with the care you need, when you need it. You will need a follow up appointment in 12 months.  Please call our office 2 months in advance to schedule this appointment.  You may see No primary care provider on file. or one of the following Advanced Practice Providers on your designated Care Team:   Lyda Jester, PA-C Melina Copa, PA-C . Ermalinda Barrios, PA-C  Any Other Special Instructions Will Be Listed Below (If Applicable).

## 2018-01-20 ENCOUNTER — Encounter: Payer: Self-pay | Admitting: Psychiatry

## 2018-01-20 ENCOUNTER — Ambulatory Visit (INDEPENDENT_AMBULATORY_CARE_PROVIDER_SITE_OTHER): Payer: 59 | Admitting: Psychiatry

## 2018-01-20 DIAGNOSIS — F314 Bipolar disorder, current episode depressed, severe, without psychotic features: Secondary | ICD-10-CM

## 2018-01-20 DIAGNOSIS — F423 Hoarding disorder: Secondary | ICD-10-CM | POA: Diagnosis not present

## 2018-01-20 DIAGNOSIS — G250 Essential tremor: Secondary | ICD-10-CM | POA: Diagnosis not present

## 2018-01-20 DIAGNOSIS — F4001 Agoraphobia with panic disorder: Secondary | ICD-10-CM

## 2018-01-20 DIAGNOSIS — F40233 Fear of injury: Secondary | ICD-10-CM

## 2018-01-20 DIAGNOSIS — F411 Generalized anxiety disorder: Secondary | ICD-10-CM

## 2018-01-20 NOTE — Progress Notes (Signed)
Beverly Cole [id number] 11-Feb-1961 57 y.o.  Subjective:   Patient ID:  Beverly Cole is a 57 y.o. (DOB 12-29-1960) female.  Chief Complaint:  Chief Complaint  Patient presents with  . Follow-up    Medication Management    HPI Beverly Cole presents to the office today for follow-up of chronic depression and anxiety.  Lately depression worse than anxiety.  Pt reports that mood is Anxious and Depressed and describes anxiety as Moderate. Anxiety symptoms include: Excessive Worry,. Past hx panic so avoidant. Pt reports sleeps excessively. Pt reports that appetite is good. Pt reports that energy is poor and loss of interest or pleasure in usual activities, poor motivation and withdrawn from usual activities. Concentration is poor. Suicidal thoughts:  denied by patient. But chronic death thoughts.  Poor productivity overall.  Watches a lot of tv.  Chronic poor self care, showers only weekly.  Just don't care.  Chronic disability.  She thinks sertraline helped the anxiety some.  Collects TV shows, she can't erase unless she watches entirely.  B6 helps tremor at current dosage.  Afraid to try new meds after negative reaction to Vraylar.  Past psych med trials: Wellbutrin XL 300, ECT 2016 without help, Vraylar SI, pramipexole, Seroquel which caused side effects of constipation, a Alythia, lamotrigine, Latuda, and refuses lithium because of altered taste.  She has a history of ataxia on 1500 mg of Depakote.  Review of Systems:  Review of Systems  Neurological: Negative for tremors and weakness.  Psychiatric/Behavioral: Positive for dysphoric mood and sleep disturbance. Negative for agitation, behavioral problems, confusion, decreased concentration, hallucinations, self-injury and suicidal ideas. The patient is nervous/anxious. The patient is not hyperactive.     Medications: I have reviewed the patient's current medications.  Current Outpatient Medications  Medication Sig Dispense Refill   . ALPRAZolam (XANAX) 0.25 MG tablet TAKE 1 TO 2 TABLETS BY MOUTH TWICE DAILY AS NEEDED FOR ANXIETY  1  . aspirin EC 325 MG tablet Take 325 mg by mouth daily.    . budesonide-formoterol (SYMBICORT) 80-4.5 MCG/ACT inhaler Inhale 2 puffs into the lungs 2 (two) times daily. 1 Inhaler 3  . buPROPion (WELLBUTRIN SR) 200 MG 12 hr tablet TAKE 1 TABLET BY MOUTH DAILY IN THE MORNING  0  . CARTIA XT 180 MG 24 hr capsule TAKE 1 CAPSULE BY MOUTH DAILY. 90 capsule 1  . DEXILANT 60 MG capsule Take 60 mg by mouth daily.  12  . divalproex (DEPAKOTE ER) 500 MG 24 hr tablet Take 1,000 mg by mouth at bedtime.  0  . fluticasone (FLONASE) 50 MCG/ACT nasal spray Place 2 sprays into the nose daily as needed. For allergies    . Probiotic Product (PROBIOTIC DAILY PO) Take 1 tablet by mouth daily.     . Pyridoxine HCl (VITAMIN B-6) 250 MG tablet Take 250 mg by mouth 2 (two) times daily.    . sertraline (ZOLOFT) 50 MG tablet Take 50 mg by mouth daily.  1  . tretinoin (RETIN-A) 0.025 % cream   4  . ketoconazole (NIZORAL) 2 % cream Apply 1 application topically daily. 60 g 3   No current facility-administered medications for this visit.     Medication Side Effects: None  Allergies:  Allergies  Allergen Reactions  . Cariprazine Other (See Comments)    Patient becomes suicidal when taking this medication.  Patient becomes suicidal when taking this medication.   . Metoprolol Other (See Comments)    Hallucinations hair falls out Hallucinations  hair falls out  . Nickel Rash    Past Medical History:  Diagnosis Date  . Anxiety   . Atrial fibrillation ([place])    a. Event monitor 2013 - PACs/bradycardia/SVT/short run of atrial fib by event monitor.   Marland Kitchen BIPOLAR DISORDER UNSPECIFIED   . Bradycardia   . Bursitis of hip 09/1999   Bilateral - Dr. Berenice Primas  . DEPRESSION   . Emphysema lung ([place]) 06/20/2016   pt states pulmonalongist stated started recently  . GERD   . Headache(784.0)    was with menstrual cycle. no  longer a problem  . HYPERLIPIDEMIA   . Hypertension   . Hypertension   . IRRITABLE BOWEL SYNDROME, HX OF   . Menopausal state 01/2012   FSH = 88.5  . Mitral valve prolapse 12/17/2000   a. dx 1980s, most recent echo did not demonstrate this.  Marland Kitchen MYALGIA   . Normal coronary arteries    a. by cardiac CT 2013.  Marland Kitchen PAT (paroxysmal atrial tachycardia) ([place])   . PE (pulmonary embolism) 02/09/2015   a. Bilateral PEs 01/2015 when d-dimer 0.69, CP lying on left side. Followed by heme-onc - + lupus anticoagulant, mildly depressed protein S. Dr. Marin Olp is not certain if her hypercoagulable studies are significant for a thrombophilic state.  . Premature atrial contractions   . Pulmonary embolus ([place]) 02/10/2015  . Shortness of breath    Pulmonary eval 05/2002  . Thyroid nodule 2014  . Tremors of nervous system     Family History  Problem Relation Age of Onset  . Lung cancer Mother   . Hypertension Mother   . Hyperlipidemia Mother   . Cancer Father        Esophageal cancer  . Hyperlipidemia Father   . Depression Father   . Cancer Brother   . Pulmonary embolism Brother   . Colon cancer Paternal Uncle   . Sarcoidosis Other   . Testicular cancer Other     Social History   Socioeconomic History  . Marital status: Married    Spouse name: Not on file  . Number of children: 0  . Years of education: Not on file  . Highest education level: Not on file  Occupational History  . Occupation: Nurse-Personal Care/HH    Employer: UNEMPLOYED  Social Needs  . Financial resource strain: Not on file  . Food insecurity:    Worry: Not on file    Inability: Not on file  . Transportation needs:    Medical: Not on file    Non-medical: Not on file  Tobacco Use  . Smoking status: Former Smoker    Packs/day: 1.00    Years: 25.00    Pack years: 25.00    Types: Cigarettes    Last attempt to quit: 02/11/1996    Years since quitting: 21.9  . Smokeless tobacco: Never Used  . Tobacco comment: Married,  lives with spouse. 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 Alcohol use: Yes    Comment: occ  . Drug use: No  . Sexual activity: Yes    Partners: Male    Birth control/protection: Post-menopausal  Lifestyle  . Physical activity:    Days per week: Not on file    Minutes per session: Not on file  . Stress: Not on file  Relationships  . Social connections:    Talks on phone: Not on file    Gets together: Not on file    Attends religious  service: Not on file    Active member of club or organization: Not on file    Attends meetings of clubs or organizations: Not on file    Relationship status: Not on file  . Intimate partner violence:    Fear of current or ex partner: Not on file    Emotionally abused: Not on file    Physically abused: Not on file    Forced sexual activity: Not on file  Other Topics Concern  . Not on file  Social History Narrative   Married, lives with spouse in [place]. 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   Please see review of systems for further details on the patient's review from today.   Objective:   Physical Exam:  LMP 05/11/2012   Physical Exam  Neurological: She displays no tremor. Gait normal.  Psychiatric: Her speech is normal and behavior is normal. Her mood appears anxious. Thought content is not paranoid. Cognition and memory are normal. She exhibits a depressed mood. She expresses no homicidal and no suicidal ideation.  Chronic death thoughts Fair-poor insight and judgment. She is inattentive.    Lab Review:     Component Value Date/Time   NA 138 12/03/2017 1408   NA 142 01/21/2017 1013   NA 143 01/16/2016 1005   K 3.3 12/03/2017 1408   K 3.2 (L) 01/21/2017 1013   K 3.3 (L) 01/16/2016 1005   CL 114 (H) 12/03/2017 1408    CL 106 01/21/2017 1013   CO2 23 12/03/2017 1408   CO2 29 01/21/2017 1013   CO2 20 (L) 01/16/2016 1005   GLUCOSE 128 (H) 12/03/2017 1408   GLUCOSE 95 01/21/2017 1013   BUN 8 12/03/2017 1408   BUN 9 01/21/2017 1013   BUN 12.6 01/16/2016 1005   CREATININE 1.10 12/03/2017 1408   CREATININE 0.9 01/21/2017 1013   CREATININE 0.9 01/16/2016 1005   CALCIUM 9.1 12/03/2017 1408   CALCIUM 9.2 01/21/2017 1013   CALCIUM 9.5 01/16/2016 1005   PROT 7.2 12/03/2017 1408   PROT 6.9 01/21/2017 1013   PROT 7.1 01/16/2016 1005   ALBUMIN 3.7 12/03/2017 1408   ALBUMIN 3.3 01/21/2017 1013   ALBUMIN 3.6 01/16/2016 1005   AST 26 12/03/2017 1408   AST 16 01/16/2016 1005   ALT 26 12/03/2017 1408   ALT 22 01/21/2017 1013   ALT 24 01/16/2016 1005   ALKPHOS 59 12/03/2017 1408   ALKPHOS 62 01/21/2017 1013   ALKPHOS 81 01/16/2016 1005   BILITOT 0.5 12/03/2017 1408   BILITOT 0.59 01/16/2016 1005   GFRNONAA >60 06/04/2017 1408   GFRAA >60 06/04/2017 1408       Component Value Date/Time   WBC 5.6 12/03/2017 1408   WBC 8.3 11/26/2017 1451   RBC 4.78 12/03/2017 1408   HGB 13.4 12/03/2017 1408   HGB 12.8 01/21/2017 1013   HCT 42.2 12/03/2017 1408   HCT 38.2 01/21/2017 1013   PLT 197 12/03/2017 1408   PLT 176 01/21/2017 1013   MCV 88.3 12/03/2017 1408   MCV 90 01/21/2017 1013   MCH 28.0 12/03/2017 1408   MCHC 31.8 12/03/2017 1408   RDW 14.6 12/03/2017 1408   RDW 14.5 01/21/2017 1013   LYMPHSABS 2.0 12/03/2017 1408   LYMPHSABS 3.6 (H) 01/21/2017 1013  MONOABS 0.3 12/03/2017 1408   EOSABS 0.1 12/03/2017 1408   EOSABS 0.5 01/21/2017 1013   BASOSABS 0.0 12/03/2017 1408   BASOSABS 0.0 01/21/2017 1013    No results found for: POCLITH, LITHIUM   Lab Results  Component Value Date   PHENYTOIN <0.5 ug/mL (L) 11/05/2006   VALPROATE 73.7 11/25/2016     .res Assessment: Plan:    Bipolar disorder with severe depression ([place])  Generalized anxiety disorder  Panic disorder with  agoraphobia  Fear of injury  Benign essential tremor  Hoarding behavior - TV shows   Lifelong history of chronic depression and anxiety with very poor functioning continues.  She gets some benefit from the current medications.  She is not currently manic.  She is tolerating the medications.  She is had multiple medication failures as noted above.  She is fearful of trying new medications because of a history of suicidal thoughts on Vraylar.  She refuses med changes today.  Chronic severe depression and poor function and negative thinking.  Refuses olanzapine DT fear of weight gain.    Still seeing therapist.  This appt was 30 mins.  FU 3 mo  Lynder Parents, MD, DFAPA   Please see After Visit Summary for patient specific instructions.  Future Appointments  Date Time Provider [place]  02/17/2018  3:00 PM Barrie Folk,  [facility] None  12/01/2018  2:30 PM Salvadore Dom, MD [place] None  12/03/2018  1:45 PM [facility] None  12/03/2018  2:15 PM [place], Holli Humbles, NP [facility] None    No orders of the defined types were placed in this encounter.     -------------------------------

## 2018-01-25 MED FILL — SYMBICORT 80-4.5 MCG INH: 80-4.5 | 30 days supply | Qty: 10 | Fill #1

## 2018-01-25 MED FILL — DEXILANT DR 60 MG CAPSULE: 60 | 30 days supply | Qty: 30 | Fill #9

## 2018-01-25 MED FILL — SERTRALINE HCL 50 MG TABLET: 50 | 30 days supply | Qty: 30 | Fill #0

## 2018-02-17 ENCOUNTER — Ambulatory Visit (INDEPENDENT_AMBULATORY_CARE_PROVIDER_SITE_OTHER): Payer: 59 | Admitting: Psychology

## 2018-02-17 DIAGNOSIS — F331 Major depressive disorder, recurrent, moderate: Secondary | ICD-10-CM | POA: Diagnosis not present

## 2018-02-18 MED FILL — SERTRALINE HCL 50 MG TABLET: 50 | 30 days supply | Qty: 30 | Fill #1

## 2018-02-18 MED FILL — DEXILANT DR 60 MG CAPSULE: 60 | 30 days supply | Qty: 30 | Fill #10

## 2018-02-26 ENCOUNTER — Other Ambulatory Visit: Payer: Self-pay

## 2018-02-26 MED ORDER — BUPROPION HCL ER (SR) 200 MG PO TB12: ORAL_TABLET | ORAL | 0 refills | Status: DC

## 2018-02-27 ENCOUNTER — Ambulatory Visit: Payer: Self-pay | Admitting: Internal Medicine

## 2018-03-01 ENCOUNTER — Ambulatory Visit (INDEPENDENT_AMBULATORY_CARE_PROVIDER_SITE_OTHER): Payer: 59 | Admitting: Family Medicine

## 2018-03-01 ENCOUNTER — Encounter: Payer: Self-pay | Admitting: Family Medicine

## 2018-03-01 ENCOUNTER — Ambulatory Visit: Payer: Self-pay | Admitting: Family Medicine

## 2018-03-01 VITALS — BP 136/76 | HR 86 | Resp 16 | Ht 69.0 in | Wt 164.0 lb

## 2018-03-01 DIAGNOSIS — R300 Dysuria: Secondary | ICD-10-CM | POA: Diagnosis not present

## 2018-03-01 DIAGNOSIS — R296 Repeated falls: Secondary | ICD-10-CM | POA: Diagnosis not present

## 2018-03-01 LAB — POC URINALSYSI DIPSTICK (AUTOMATED)
Glucose, UA: POSITIVE — AB
Nitrite, UA: POSITIVE
Protein, UA: POSITIVE — AB
Spec Grav, UA: 1.02 (ref 1.010–1.025)
Urobilinogen, UA: >=8 E.U./dL — AB
pH, UA: 5 (ref 5.0–8.0)

## 2018-03-01 MED ORDER — NONFORMULARY OR COMPOUNDED ITEM: Status: DC

## 2018-03-01 MED ORDER — CIPROFLOXACIN HCL 250 MG PO TABS: 250.0000 mg | ORAL_TABLET | Freq: Two times a day (BID) | ORAL | 0 refills | Status: DC

## 2018-03-01 MED ORDER — NONFORMULARY OR COMPOUNDED ITEM: 0 refills | Status: DC

## 2018-03-01 MED ORDER — PHENAZOPYRIDINE HCL 200 MG PO TABS: 200.0000 mg | ORAL_TABLET | Freq: Three times a day (TID) | ORAL | 0 refills | Status: DC | PRN

## 2018-03-01 MED FILL — BUPROPION HCL SR 200 MG TAB: 200 | 90 days supply | Qty: 90 | Fill #0

## 2018-03-01 MED FILL — PHENAZOPYRIDINE 200 MG TAB: 200 | 3 days supply | Qty: 10 | Fill #0

## 2018-03-01 MED FILL — CIPROFLOXACIN HCL 250 MG TA: 250 | 5 days supply | Qty: 10 | Fill #0

## 2018-03-01 NOTE — Progress Notes (Signed)
Patient ID: Beverly Cole, female    DOB: Aug 11, 1960  Age: 58 y.o. MRN: [id number]    Subjective:  Subjective  HPI Beverly Cole presents for dysuria and urinary frequency. X several days   No fever  Review of Systems  Constitutional: Negative for activity change, appetite change, fatigue and unexpected weight change.  Respiratory: Negative for cough and shortness of breath.   Cardiovascular: Negative for chest pain and palpitations.  Genitourinary: Positive for dysuria and frequency.  Psychiatric/Behavioral: Negative for behavioral problems and dysphoric mood. The patient is not nervous/anxious.     History Past Medical History:  Diagnosis Date  . Anxiety   . Atrial fibrillation ([place])    a. Event monitor 2013 - PACs/bradycardia/SVT/short run of atrial fib by event monitor.   Marland Kitchen BIPOLAR DISORDER UNSPECIFIED   . Bradycardia   . Bursitis of hip 09/1999   Bilateral - Dr. Berenice Primas  . DEPRESSION   . Emphysema lung ([place]) 06/20/2016   pt states pulmonalongist stated started recently  . GERD   . Headache(784.0)    was with menstrual cycle. no longer a problem  . HYPERLIPIDEMIA   . Hypertension   . Hypertension   . IRRITABLE BOWEL SYNDROME, HX OF   . Menopausal state 01/2012   FSH = 88.5  . Mitral valve prolapse 12/17/2000   a. dx 1980s, most recent echo did not demonstrate this.  Marland Kitchen MYALGIA   . Normal coronary arteries    a. by cardiac CT 2013.  Marland Kitchen PAT (paroxysmal atrial tachycardia) ([place])   . PE (pulmonary embolism) 02/09/2015   a. Bilateral PEs 01/2015 when d-dimer 0.69, CP lying on left side. Followed by heme-onc - + lupus anticoagulant, mildly depressed protein S. Dr. Marin Olp is not certain if Beverly Cole hypercoagulable studies are significant for a thrombophilic state.  . Premature atrial contractions   . Pulmonary embolus ([place]) 02/10/2015  . Shortness of breath    Pulmonary eval 05/2002  . Thyroid nodule 2014  . Tremors of nervous system     She has a past surgical history that  includes Lipoma (R) side (1990); Colonoscopy; laparoscopic appendectomy (N/A, 03/17/2017); and Appendectomy.   Beverly Cole family history includes Cancer in Beverly Cole brother and father; Colon cancer in Beverly Cole paternal uncle; Depression in Beverly Cole father; Hyperlipidemia in Beverly Cole father and mother; Hypertension in Beverly Cole mother; Lung cancer in Beverly Cole mother; Pulmonary embolism in Beverly Cole brother; Sarcoidosis in an other family member; Testicular cancer in an other family member.She reports that she quit smoking about 22 years ago. Beverly Cole smoking use included cigarettes. She has a 25.00 pack-year smoking history. She has never used smokeless tobacco. She reports current alcohol use. She reports that she does not use drugs.  Current Outpatient Medications on File Prior to Visit  Medication Sig Dispense Refill  . Nitrofurantoin Monohyd Macro (MACROBID PO) Take 1 capsule by mouth. After sex    . ALPRAZolam (XANAX) 0.25 MG tablet TAKE 1 TO 2 TABLETS BY MOUTH TWICE DAILY AS NEEDED FOR ANXIETY  1  . aspirin EC 325 MG tablet Take 325 mg by mouth daily.    . budesonide-formoterol (SYMBICORT) 80-4.5 MCG/ACT inhaler Inhale 2 puffs into the lungs 2 (two) times daily. 1 Inhaler 3  . buPROPion (WELLBUTRIN SR) 200 MG 12 hr tablet TAKE 1 TABLET BY MOUTH DAILY IN THE MORNING 90 tablet 0  . CARTIA XT 180 MG 24 hr capsule TAKE 1 CAPSULE BY MOUTH DAILY. 90 capsule 1  . DEXILANT 60 MG capsule Take 60 mg  by mouth daily.  12  . divalproex (DEPAKOTE ER) 500 MG 24 hr tablet Take 1,000 mg by mouth at bedtime.   0  . fluticasone (FLONASE) 50 MCG/ACT nasal spray Place 2 sprays into the nose daily as needed. For allergies    . ketoconazole (NIZORAL) 2 % cream Apply 1 application topically daily. 60 g 3  . Probiotic Product (PROBIOTIC DAILY PO) Take 1 tablet by mouth daily.     . Pyridoxine HCl (VITAMIN B-6) 250 MG tablet Take 250 mg by mouth 2 (two) times daily.    . sertraline (ZOLOFT) 50 MG tablet Take 50 mg by mouth daily.   1  . tretinoin (RETIN-A) 0.025 %  cream   4   No current facility-administered medications on file prior to visit.      Objective:  Objective  Physical Exam Vitals signs and nursing note reviewed.  Constitutional:      Appearance: She is well-developed.  HENT:     Right Ear: External ear normal.     Left Ear: External ear normal.  Eyes:     General:        Right eye: No discharge.        Left eye: No discharge.     Conjunctiva/sclera: Conjunctivae normal.  Cardiovascular:     Rate and Rhythm: Normal rate and regular rhythm.     Heart sounds: Normal heart sounds. No murmur.  Pulmonary:     Effort: Pulmonary effort is normal. No respiratory distress.     Breath sounds: Normal breath sounds. No wheezing or rales.  Chest:     Chest wall: No tenderness.  Abdominal:     Tenderness: There is no abdominal tenderness. There is no right CVA tenderness, left CVA tenderness, guarding or rebound.  Lymphadenopathy:     Cervical: No cervical adenopathy.  Neurological:     Mental Status: She is alert and oriented to person, place, and time.    BP 136/76 (BP Location: Right Arm, Cuff Size: Normal)   Pulse 86   Resp 16   Ht 5\' 9"  (1.753 m)   Wt 164 lb (74.4 kg)   LMP 05/11/2012   SpO2 95%   BMI 24.22 kg/m  Wt Readings from Last 3 Encounters:  03/01/18 164 lb (74.4 kg)  01/11/18 162 lb (73.5 kg)  12/03/17 163 lb 1.9 oz (74 kg)     Lab Results  Component Value Date   WBC 5.6 12/03/2017   HGB 13.4 12/03/2017   HCT 42.2 12/03/2017   PLT 197 12/03/2017   GLUCOSE 128 (H) 12/03/2017   CHOL 237 (H) 11/26/2017   TRIG 189.0 (H) 11/26/2017   HDL 44.80 11/26/2017   LDLDIRECT 186.0 05/26/2017   LDLCALC 154 (H) 11/26/2017   ALT 26 12/03/2017   AST 26 12/03/2017   NA 138 12/03/2017   K 3.3 12/03/2017   CL 114 (H) 12/03/2017   CREATININE 1.10 12/03/2017   BUN 8 12/03/2017   CO2 23 12/03/2017   TSH 2.99 11/26/2017   INR 1.10 02/09/2015   HGBA1C 5.3 03/20/2009   MICROALBUR 0.6 05/19/2011    No results  found.   Assessment & Plan:  Plan  I am having Beverly Cole. Six "Beverly Cole" start on NONFORMULARY OR COMPOUNDED ITEM, ciprofloxacin, phenazopyridine, and NONFORMULARY OR COMPOUNDED ITEM. I am also having Beverly Cole maintain Beverly Cole fluticasone, Probiotic Product (PROBIOTIC DAILY PO), ketoconazole, ALPRAZolam, divalproex, aspirin EC, sertraline, CARTIA XT, budesonide-formoterol, DEXILANT, tretinoin, vitamin B-6, buPROPion, and Nitrofurantoin Monohyd Macro (MACROBID  PO).  Meds ordered this encounter  Medications  . NONFORMULARY OR COMPOUNDED ITEM    Sig: Azelaic acid; metronidazole / ivermectin cream  Bid  . ciprofloxacin (CIPRO) 250 MG tablet    Sig: Take 1 tablet (250 mg total) by mouth 2 (two) times daily.    Dispense:  10 tablet    Refill:  0  . phenazopyridine (PYRIDIUM) 200 MG tablet    Sig: Take 1 tablet (200 mg total) by mouth 3 (three) times daily as needed for pain.    Dispense:  10 tablet    Refill:  0  . NONFORMULARY OR COMPOUNDED ITEM    Sig: Walker  #1  ---  Type per pt choice   Dx hx of falling    Dispense:  1 each    Refill:  0    Problem List Items Addressed This Visit    None    Visit Diagnoses    Dysuria    -  Primary   Relevant Medications   ciprofloxacin (CIPRO) 250 MG tablet   phenazopyridine (PYRIDIUM) 200 MG tablet   Other Relevant Orders   POCT Urinalysis Dipstick (Automated) (Completed)   Urine Culture   POCT Urinalysis Dipstick (Automated)   Falling       Relevant Medications   NONFORMULARY OR COMPOUNDED ITEM   Other Relevant Orders   Ambulatory referral to Physical Therapy      Follow-up: Return in about 2 weeks (around 03/15/2018), or if symptoms worsen or fail to improve.  [place], DO    +----------------------------------------------------------------------------------------------

## 2018-03-01 NOTE — Patient Instructions (Signed)
Fall Prevention in the Home, Adult  Falls can cause injuries and can affect people from all age groups. There are many simple things that you can do to make your home safe and to help prevent falls. Ask for help when making these changes, if needed.  What actions can I take to prevent falls?  General instructions  · Use good lighting in all rooms. Replace any light bulbs that burn out.  · Turn on lights if it is dark. Use night-lights.  · Place frequently used items in easy-to-reach places. Lower the shelves around your home if necessary.  · Set up furniture so that there are clear paths around it. Avoid moving your furniture around.  · Remove throw rugs and other tripping hazards from the floor.  · Avoid walking on wet floors.  · Fix any uneven floor surfaces.  · Add color or contrast paint or tape to grab bars and handrails in your home. Place contrasting color strips on the first and last steps of stairways.  · When you use a stepladder, make sure that it is completely opened and that the sides are firmly locked. Have someone hold the ladder while you are using it. Do not climb a closed stepladder.  · Be aware of any and all pets.  What can I do in the bathroom?         · Keep the floor dry. Immediately clean up any water that spills onto the floor.  · Remove soap buildup in the tub or shower on a regular basis.  · Use non-skid mats or decals on the floor of the tub or shower.  · Attach bath mats securely with double-sided, non-slip rug tape.  · If you need to sit down while you are in the shower, use a plastic, non-slip stool.  · Install grab bars by the toilet and in the tub and shower. Do not use towel bars as grab bars.  What can I do in the bedroom?  · Make sure that a bedside light is easy to reach.  · Do not use oversized bedding that drapes onto the floor.  · Have a firm chair that has side arms to use for getting dressed.  What can I do in the kitchen?  · Clean up any spills right away.  · If you need to  reach for something above you, use a sturdy step stool that has a grab bar.  · Keep electrical cables out of the way.  · Do not use floor polish or wax that makes floors slippery. If you must use wax, make sure that it is non-skid floor wax.  What can I do in the stairways?  · Do not leave any items on the stairs.  · Make sure that you have a light switch at the top of the stairs and the bottom of the stairs. Have them installed if you do not have them.  · Make sure that there are handrails on both sides of the stairs. Fix handrails that are broken or loose. Make sure that handrails are as long as the stairways.  · Install non-slip stair treads on all stairs in your home.  · Avoid having throw rugs at the top or bottom of stairways, or secure the rugs with carpet tape to prevent them from moving.  · Choose a carpet design that does not hide the edge of steps on the stairway.  · Check any carpeting to make sure that it is firmly attached   to the stairs. Fix any carpet that is loose or worn.  What can I do on the outside of my home?  · Use bright outdoor lighting.  · Regularly repair the edges of walkways and driveways and fix any cracks.  · Remove high doorway thresholds.  · Trim any shrubbery on the main path into your home.  · Regularly check that handrails are securely fastened and in good repair. Both sides of any steps should have handrails.  · Install guardrails along the edges of any raised decks or porches.  · Clear walkways of debris and clutter, including tools and rocks.  · Have leaves, snow, and ice cleared regularly.  · Use sand or salt on walkways during winter months.  · In the garage, clean up any spills right away, including grease or oil spills.  What other actions can I take?  · Wear closed-toe shoes that fit well and support your feet. Wear shoes that have rubber soles or low heels.  · Use mobility aids as needed, such as canes, walkers, scooters, and crutches.  · Review your medicines with your  health care provider. Some medicines can cause dizziness or changes in blood pressure, which increase your risk of falling.  Talk with your health care provider about other ways that you can decrease your risk of falls. This may include working with a physical therapist or trainer to improve your strength, balance, and endurance.  Where to find more information  · Centers for Disease Control and Prevention, [place]: https://www.cdc.gov  · National Institute on Aging: https://go4life.nia.nih.gov  Contact a health care provider if:  · You are afraid of falling at home.  · You feel weak, drowsy, or dizzy at home.  · You fall at home.  Summary  · There are many simple things that you can do to make your home safe and to help prevent falls.  · Ways to make your home safe include removing tripping hazards and installing grab bars in the bathroom.  · Ask for help when making these changes in your home.  This information is not intended to replace advice given to you by your health care provider. Make sure you discuss any questions you have with your health care provider.  Document Released: 01/17/2002 Document Revised: 09/11/2016 Document Reviewed: 09/11/2016  Elsevier Interactive Patient Education © 2019 [employer]

## 2018-03-04 LAB — URINE CULTURE
MICRO NUMBER:: 77859
SPECIMEN QUALITY:: ADEQUATE

## 2018-03-10 ENCOUNTER — Telehealth: Payer: Self-pay | Admitting: Obstetrics and Gynecology

## 2018-03-10 ENCOUNTER — Encounter: Payer: Self-pay | Admitting: Obstetrics and Gynecology

## 2018-03-10 MED ORDER — NITROFURANTOIN MACROCRYSTAL 50 MG PO CAPS: ORAL_CAPSULE | ORAL | 1 refills | Status: DC

## 2018-03-10 MED FILL — NITROFURANTOIN MCR 50 MG CA: 50 | 30 days supply | Qty: 30 | Fill #0

## 2018-03-10 NOTE — Telephone Encounter (Signed)
Routing refill request to Dr. Talbert Nan

## 2018-03-10 NOTE — Telephone Encounter (Signed)
Patient sent the following correspondence through [facility]. Routing to triage to assist patient with request.  Chong Sicilian has subscribed Macrobid, one pill after sex in the past. My pills are old and recently had a UTI. Can you please update a prescription to [facility]?  I called the patient to offer her an appointment but she declined stating she has already been seen by her PCP and no longer has a urinary tract infection. She said she just needs the prescription refilled so she can take Macrobid after sex to avoid UTI's in the future.

## 2018-03-10 NOTE — Telephone Encounter (Signed)
It is recommended to only use prophylactic antibiotics if patients are getting recurrent UTI's with intercourse. At her last 2 visits she reported not being sexually active.  I will send in a script, but she should make sure she is well hydrated, wipes front to back and voids after sex. The risk of taking antibiotics is developing resistant bacteria.

## 2018-03-11 NOTE — Telephone Encounter (Signed)
Responded to patient via mychart.  Encounter closed.

## 2018-03-14 ENCOUNTER — Encounter: Payer: Self-pay | Admitting: Family Medicine

## 2018-03-15 ENCOUNTER — Encounter: Payer: Self-pay | Admitting: Family Medicine

## 2018-03-15 ENCOUNTER — Telehealth: Payer: Self-pay

## 2018-03-15 MED FILL — DEXILANT DR 60 MG CAPSULE: 60 | 30 days supply | Qty: 30 | Fill #11

## 2018-03-15 MED FILL — CARTIA XT 180 MG CAPSULE SA: 180 | 90 days supply | Qty: 90 | Fill #1

## 2018-03-15 NOTE — Telephone Encounter (Signed)
Copied from [place] [id number]. Topic: General - Inquiry >> Mar 15, 2018  4:48 PM Berneta Levins wrote: Reason for CRM:   See MyChart message from 03/14/2018 and 03/15/2018.  Pt wants to know if she can just drop a sample off to determine if she still has a UTI. Pt can be reached at [phone]

## 2018-03-16 ENCOUNTER — Other Ambulatory Visit: Payer: Self-pay

## 2018-03-16 ENCOUNTER — Encounter: Payer: Self-pay | Admitting: Family Medicine

## 2018-03-16 ENCOUNTER — Other Ambulatory Visit: Payer: Self-pay | Admitting: *Deleted

## 2018-03-16 DIAGNOSIS — R300 Dysuria: Secondary | ICD-10-CM

## 2018-03-16 MED ORDER — SERTRALINE HCL 50 MG PO TABS: 50.0000 mg | ORAL_TABLET | Freq: Every day | ORAL | 1 refills | Status: DC

## 2018-03-16 MED FILL — SERTRALINE HCL 50 MG TABLET: 50 | 90 days supply | Qty: 90 | Fill #0 | Status: TO

## 2018-03-16 NOTE — Telephone Encounter (Signed)
See mychart messages

## 2018-03-16 NOTE — Telephone Encounter (Signed)
See other messages

## 2018-03-16 NOTE — Telephone Encounter (Signed)
mychart message sent to patient

## 2018-03-16 NOTE — Telephone Encounter (Signed)
See other phone message  

## 2018-03-16 NOTE — Telephone Encounter (Signed)
Please communicate with the patient: Okay to bring a UA and urine culture. DX UTI.

## 2018-03-16 NOTE — Telephone Encounter (Signed)
Dr. Etter Sjogren did a urine culture on this patient on 03/01/18 which showed a UTI.  Patient states now that it has came back.  Usually Dr. Etter Sjogren likes to recheck in 2 weeks. Will it be ok if patient comes in for lab only to recheck urine?

## 2018-03-17 ENCOUNTER — Other Ambulatory Visit (INDEPENDENT_AMBULATORY_CARE_PROVIDER_SITE_OTHER): Payer: Commercial Managed Care - PPO

## 2018-03-17 ENCOUNTER — Telehealth: Payer: Self-pay | Admitting: Family Medicine

## 2018-03-17 ENCOUNTER — Encounter: Payer: Self-pay | Admitting: Family Medicine

## 2018-03-17 DIAGNOSIS — R300 Dysuria: Secondary | ICD-10-CM

## 2018-03-17 LAB — POC URINALSYSI DIPSTICK (AUTOMATED)
Glucose, UA: POSITIVE — AB
Nitrite, UA: POSITIVE
Protein, UA: POSITIVE — AB
Spec Grav, UA: 1.025 (ref 1.010–1.025)
Urobilinogen, UA: 4 E.U./dL — AB
pH, UA: 5.5 (ref 5.0–8.0)

## 2018-03-17 NOTE — Telephone Encounter (Signed)
Copied from [place] [id number]. Topic: General - Other >> Mar 17, 2018  4:45 PM Keene Breath wrote: Reason for CRM: Patient called to check the status of her urine test for a UTI and wants to know if an antibiotic has been sent into the pharmacy.  Patient would like it sent to [id number] - [place], [place]. [phone] (Phone) [phone] (Fax).  Please advise and call patient back when it has been sent.  CB# [phone]

## 2018-03-18 ENCOUNTER — Encounter: Payer: Self-pay | Admitting: Family Medicine

## 2018-03-18 MED ORDER — SULFAMETHOXAZOLE-TRIMETHOPRIM 800-160 MG PO TABS: 1.0000 | ORAL_TABLET | Freq: Two times a day (BID) | ORAL | 0 refills | Status: AC

## 2018-03-18 MED FILL — SULFAMETHOXAZOLE-TMP DS TAB: 800-160 | 7 days supply | Qty: 14 | Fill #0

## 2018-03-18 NOTE — Telephone Encounter (Signed)
Message complete.  Please see phone message.

## 2018-03-18 NOTE — Telephone Encounter (Signed)
Message completed.  Please see phone message.

## 2018-03-18 NOTE — Telephone Encounter (Signed)
The urine culture is pending.  I would prefer to wait for urine culture however I'm  leaving town today. Okay to call for Bactrim DS 1 p.o. twice daily #14. Please check her chart tomorrow, see if the urine culture come back and ask the doctor of the day if Bactrim isappropriate.

## 2018-03-18 NOTE — Telephone Encounter (Signed)
Patient notified that prescription was being sent in.  Also advised that we were still awaiting culture results and will call when they come in.

## 2018-03-18 NOTE — Telephone Encounter (Signed)
Message has been taken care of.  See phone note.

## 2018-03-18 NOTE — Telephone Encounter (Signed)
Message completed.  See phone message.

## 2018-03-18 NOTE — Telephone Encounter (Signed)
Patient came in and did urine yesterday.  She has sent to [facility] since then.  I ordered them under your name.  Based on her urine dip.  Would you be able to send in an antibiotic for her?

## 2018-03-19 ENCOUNTER — Ambulatory Visit: Payer: 59 | Admitting: Psychology

## 2018-03-19 LAB — URINE CULTURE
MICRO NUMBER:: 154970
SPECIMEN QUALITY:: ADEQUATE

## 2018-03-22 ENCOUNTER — Telehealth: Payer: Self-pay | Admitting: *Deleted

## 2018-03-22 MED ORDER — CEPHALEXIN 500 MG PO CAPS: 500.0000 mg | ORAL_CAPSULE | Freq: Two times a day (BID) | ORAL | 0 refills | Status: AC

## 2018-03-22 NOTE — Telephone Encounter (Signed)
Left detailed message on machine that she was positive for UTI and that the bactrim should work.  I will let Dr. Etter Sjogren know that this has been her second UTI in a month and what would be next step.    Keflex cancelled at pharmacy.

## 2018-03-22 NOTE — Telephone Encounter (Signed)
Dr.  Larose Kells sent in bactrim last week.  You want her to discontinue the bactrim?

## 2018-03-22 NOTE — Telephone Encounter (Signed)
Can you please review this patients urine culture?  She uses mychart.

## 2018-03-22 NOTE — Telephone Encounter (Signed)
I didn't realize that. She can continue bactrim. Could you please cancel keflex rx?

## 2018-03-22 NOTE — Telephone Encounter (Signed)
Please advise pt that urine shows UTI. Please begin keflex x 5 days. Call if symptoms worsen or if symptoms fail to improve.

## 2018-03-23 ENCOUNTER — Other Ambulatory Visit: Payer: Self-pay | Admitting: Family Medicine

## 2018-03-23 DIAGNOSIS — N390 Urinary tract infection, site not specified: Secondary | ICD-10-CM

## 2018-03-23 NOTE — Telephone Encounter (Signed)
She needs a urology referral

## 2018-03-25 ENCOUNTER — Encounter: Payer: Self-pay | Admitting: *Deleted

## 2018-03-25 NOTE — Telephone Encounter (Signed)
mychart message sent to patient

## 2018-03-26 ENCOUNTER — Encounter: Payer: Self-pay | Admitting: Family Medicine

## 2018-03-30 ENCOUNTER — Encounter: Payer: Self-pay | Admitting: Family Medicine

## 2018-04-01 DIAGNOSIS — N3000 Acute cystitis without hematuria: Secondary | ICD-10-CM | POA: Diagnosis not present

## 2018-04-01 DIAGNOSIS — R3915 Urgency of urination: Secondary | ICD-10-CM | POA: Diagnosis not present

## 2018-04-01 DIAGNOSIS — N200 Calculus of kidney: Secondary | ICD-10-CM | POA: Diagnosis not present

## 2018-04-01 MED FILL — TRIMETHOPRIM 100 MG TABLET: 100 | 30 days supply | Qty: 30 | Fill #0

## 2018-04-05 ENCOUNTER — Ambulatory Visit: Payer: Self-pay | Admitting: Cardiovascular Disease

## 2018-04-08 ENCOUNTER — Other Ambulatory Visit: Payer: Self-pay

## 2018-04-08 MED ORDER — DIVALPROEX SODIUM ER 500 MG PO TB24: 1000.0000 mg | ORAL_TABLET | Freq: Every day | ORAL | 0 refills | Status: DC

## 2018-04-08 MED FILL — DEXILANT DR 60 MG CAPSULE: 60 | 30 days supply | Qty: 30 | Fill #12

## 2018-04-08 MED FILL — DIVALPROEX SOD ER 500 MG TA: 500 | 90 days supply | Qty: 180 | Fill #0

## 2018-04-08 NOTE — Progress Notes (Signed)
Refill request from [facility] for pt's Divalproex ER 500mg  2 tablets at bedtime. Next office visit 05/11/2018 Current with appts.

## 2018-04-21 ENCOUNTER — Ambulatory Visit: Payer: 59 | Admitting: Psychiatry

## 2018-04-28 ENCOUNTER — Telehealth: Payer: Self-pay | Admitting: Psychiatry

## 2018-04-28 NOTE — Telephone Encounter (Signed)
Patient has been summons for jury duty, but feels she is not capable due to her anxiety. She is requesting a note to pardon her of this obligation. Jury duty scheduled for 05/17/2018

## 2018-04-28 NOTE — Telephone Encounter (Signed)
Agree with jury excuse's for severe major depression, generalized anxiety, and social anxiety disorder.  Please write an excuse for her.

## 2018-05-04 ENCOUNTER — Other Ambulatory Visit: Payer: Self-pay | Admitting: Psychiatry

## 2018-05-04 DIAGNOSIS — N3000 Acute cystitis without hematuria: Secondary | ICD-10-CM | POA: Diagnosis not present

## 2018-05-04 DIAGNOSIS — R102 Pelvic and perineal pain: Secondary | ICD-10-CM | POA: Diagnosis not present

## 2018-05-04 DIAGNOSIS — N200 Calculus of kidney: Secondary | ICD-10-CM | POA: Diagnosis not present

## 2018-05-04 MED ORDER — BUPROPION HCL ER (SR) 200 MG PO TB12: ORAL_TABLET | ORAL | 0 refills | Status: DC

## 2018-05-05 ENCOUNTER — Ambulatory Visit (INDEPENDENT_AMBULATORY_CARE_PROVIDER_SITE_OTHER): Payer: 59 | Admitting: Psychology

## 2018-05-05 DIAGNOSIS — F331 Major depressive disorder, recurrent, moderate: Secondary | ICD-10-CM | POA: Diagnosis not present

## 2018-05-07 MED FILL — BUPROPION HCL SR 200 MG TAB: 200 | 90 days supply | Qty: 90 | Fill #0

## 2018-05-07 MED FILL — DEXILANT DR 60 MG CAPSULE: 60 | 30 days supply | Qty: 30 | Fill #0

## 2018-05-11 ENCOUNTER — Encounter: Payer: Self-pay | Admitting: Psychiatry

## 2018-05-11 ENCOUNTER — Ambulatory Visit (INDEPENDENT_AMBULATORY_CARE_PROVIDER_SITE_OTHER): Payer: 59 | Admitting: Psychiatry

## 2018-05-11 ENCOUNTER — Other Ambulatory Visit: Payer: Self-pay

## 2018-05-11 DIAGNOSIS — F314 Bipolar disorder, current episode depressed, severe, without psychotic features: Secondary | ICD-10-CM

## 2018-05-11 DIAGNOSIS — F4001 Agoraphobia with panic disorder: Secondary | ICD-10-CM

## 2018-05-11 DIAGNOSIS — F411 Generalized anxiety disorder: Secondary | ICD-10-CM | POA: Diagnosis not present

## 2018-05-11 DIAGNOSIS — G250 Essential tremor: Secondary | ICD-10-CM | POA: Diagnosis not present

## 2018-05-11 MED ORDER — BUSPIRONE HCL 30 MG PO TABS: ORAL_TABLET | ORAL | 0 refills | Status: DC

## 2018-05-11 MED FILL — busPIRone HCL 30 MG TABS: 30 | 90 days supply | Qty: 180 | Fill #0

## 2018-05-11 NOTE — Progress Notes (Signed)
Beverly Cole [id number] [birth year] 58 y.o.  Subjective:   Patient ID:  Beverly Cole is a 58 y.o. (DOB 11/03/1960) female.  Chief Complaint:  Chief Complaint  Patient presents with  . Depression  . Anorexia  . Follow-up    Medication management    Depression         Associated symptoms include no decreased concentration and no suicidal ideas.  [name]  today for follow-up of chronic depression and anxiety.  Lately depression worse than anxiety.  Last seen January 20, 2018.  Patient refused any medication changes   Pt reports that mood is unstable up and down without mania  Anxious and Depressed and describes anxiety as Moderate. Anxiety symptoms include: Excessive Worry,. Depressive cycles last a few weeks. Mood swings predate Zoloft.  She thinks it reduces her obsessiveness.  Past hx panic so avoidant. Never get to relax.  Pt reports sleeps excessively. Pt reports that appetite is good. Pt reports that energy is poor and loss of interest or pleasure in usual activities, poor motivation and withdrawn from usual activities. Concentration is poor. Suicidal thoughts:  denied by patient. But chronic death thoughts.  Poor productivity overall.  Watches a lot of tv.  Chronic poor self care, showers only weekly.  Just don't care.  Chronic disability.  She thinks sertraline helped the anxiety some.  Likes that H is at home.  Helps her mood.  Doesn't care about going out.  Collects TV shows, she can't erase unless she watches entirely.  B6  Was stopped.  Afraid to try new meds after negative reaction to Vraylar.  Past psych med trials: Wellbutrin XL 300, ECT 2016 without help, Vraylar SI, pramipexole, Seroquel which caused side effects of constipation, Abilify , lamotrigine, Latuda, and refuses lithium because of altered taste.  She has a history of ataxia on 1500 mg of Depakote.  Remotely took buspirone and thinks that helped her.  Review of Systems:  Review of Systems   Neurological: Negative for tremors and weakness.  Psychiatric/Behavioral: Positive for depression, dysphoric mood and sleep disturbance. Negative for agitation, behavioral problems, confusion, decreased concentration, hallucinations, self-injury and suicidal ideas. The patient is nervous/anxious. The patient is not hyperactive.     Medications: I have reviewed the patient's current medications.  Current Outpatient Medications  Medication Sig Dispense Refill  . ALPRAZolam (XANAX) 0.25 MG tablet TAKE 1 TO 2 TABLETS BY MOUTH TWICE DAILY AS NEEDED FOR ANXIETY  1  . budesonide-formoterol (SYMBICORT) 80-4.5 MCG/ACT inhaler Inhale 2 puffs into the lungs 2 (two) times daily. 1 Inhaler 3  . buPROPion (WELLBUTRIN SR) 200 MG 12 hr tablet TAKE 1 TABLET BY MOUTH DAILY IN THE MORNING 90 tablet 0  . CARTIA XT 180 MG 24 hr capsule TAKE 1 CAPSULE BY MOUTH DAILY. 90 capsule 1  . DEXILANT 60 MG capsule Take 60 mg by mouth daily.  12  . divalproex (DEPAKOTE ER) 500 MG 24 hr tablet Take 2 tablets (1,000 mg total) by mouth at bedtime. 180 tablet 0  . fluticasone (FLONASE) 50 MCG/ACT nasal spray Place 2 sprays into the nose daily as needed. For allergies    . Probiotic Product (PROBIOTIC DAILY PO) Take 1 tablet by mouth daily.     . propranolol (INDERAL) 20 MG tablet Take 20 mg by mouth 2 (two) times daily.    Marland Kitchen aspirin EC 325 MG tablet Take 325 mg by mouth daily.    . busPIRone (BUSPAR) 30 MG tablet 1/3 twice daily  for 1 week, then two thirds twice daily for 1 week, then 1 twice daily 180 tablet 0  . ciprofloxacin (CIPRO) 250 MG tablet Take 1 tablet (250 mg total) by mouth 2 (two) times daily. (Patient not taking: Reported on 05/11/2018) 10 tablet 0  . ketoconazole (NIZORAL) 2 % cream Apply 1 application topically daily. (Patient not taking: Reported on 05/11/2018) 60 g 3  . nitrofurantoin (MACRODANTIN) 50 MG capsule 1 tablet po prn intercourse (Patient not taking: Reported on 05/11/2018) 30 capsule 1  .  Nitrofurantoin Monohyd Macro (MACROBID PO) Take 1 capsule by mouth. After sex    . NONFORMULARY OR COMPOUNDED ITEM Azelaic acid; metronidazole / ivermectin cream  Bid    . NONFORMULARY OR COMPOUNDED ITEM Walker  #1  ---  Type per pt choice   Dx hx of falling 1 each 0  . phenazopyridine (PYRIDIUM) 200 MG tablet Take 1 tablet (200 mg total) by mouth 3 (three) times daily as needed for pain. 10 tablet 0  . Pyridoxine HCl (VITAMIN B-6) 250 MG tablet Take 250 mg by mouth 2 (two) times daily.    . sertraline (ZOLOFT) 50 MG tablet Take 1 tablet (50 mg total) by mouth daily. 90 tablet 1  . tretinoin (RETIN-A) 0.025 % cream   4   No current facility-administered medications for this visit.     Medication Side Effects: None  Allergies:  Allergies  Allergen Reactions  . Cariprazine Other (See Comments)    Patient becomes suicidal when taking this medication.  Patient becomes suicidal when taking this medication.   . Metoprolol Other (See Comments)    Hallucinations hair falls out Hallucinations hair falls out  . Nickel Rash    Past Medical History:  Diagnosis Date  . Anxiety   . Atrial fibrillation ([place])    a. Event monitor 2013 - PACs/bradycardia/SVT/short run of atrial fib by event monitor.   Marland Kitchen BIPOLAR DISORDER UNSPECIFIED   . Bradycardia   . Bursitis of hip 09/1999   Bilateral - Dr. Berenice Primas  . DEPRESSION   . Emphysema lung ([place]) 06/20/2016   pt states pulmonalongist stated started recently  . GERD   . Headache(784.0)    was with menstrual cycle. no longer a problem  . HYPERLIPIDEMIA   . Hypertension   . Hypertension   . IRRITABLE BOWEL SYNDROME, HX OF   . Menopausal state 01/2012   FSH = 88.5  . Mitral valve prolapse 12/17/2000   a. dx 1980s, most recent echo did not demonstrate this.  Marland Kitchen MYALGIA   . Normal coronary arteries    a. by cardiac CT 2013.  Marland Kitchen PAT (paroxysmal atrial tachycardia) ([place])   . PE (pulmonary embolism) 02/09/2015   a. Bilateral PEs 01/2015 when d-dimer  0.69, CP lying on left side. Followed by heme-onc - + lupus anticoagulant, mildly depressed protein S. Dr. Marin Olp is not certain if her hypercoagulable studies are significant for a thrombophilic state.  . Premature atrial contractions   . Pulmonary embolus ([place]) 02/10/2015  . Shortness of breath    Pulmonary eval 05/2002  . Thyroid nodule 2014  . Tremors of nervous system     Family History  Problem Relation Age of Onset  . Lung cancer Mother   . Hypertension Mother   . Hyperlipidemia Mother   . Cancer Father        Esophageal cancer  . Hyperlipidemia Father   . Depression Father   . Cancer Brother   . Pulmonary embolism Brother   .  Colon cancer Paternal Uncle   . Sarcoidosis Other   . Testicular cancer Other     Social History   Socioeconomic History  . Marital status: Married    Spouse name: Not on file  . Number of children: 0  . Years of education: Not on file  . Highest education level: Not on file  Occupational History  . Occupation: Nurse-Personal Care/HH    Employer: UNEMPLOYED  Social Needs  . Financial resource strain: Not on file  . Food insecurity:    Worry: Not on file    Inability: Not on file  . Transportation needs:    Medical: Not on file    Non-medical: Not on file  Tobacco Use  . Smoking status: Former Smoker    Packs/day: 1.00    Years: 25.00    Pack years: 25.00    Types: Cigarettes    Last attempt to quit: 02/11/1996    Years since quitting: 22.2  . Smokeless tobacco: Never Used  . Tobacco comment: Married, lives with spouse. 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 Alcohol use: Yes    Comment: occ  . Drug use: No  . Sexual activity: Yes    Partners: Male    Birth control/protection: Post-menopausal  Lifestyle  . Physical activity:    Days per week: Not on file    Minutes per session: Not on file  . Stress: Not on file  Relationships  . Social connections:    Talks on phone: Not on file     Gets together: Not on file    Attends religious service: Not on file    Active member of club or organization: Not on file    Attends meetings of clubs or organizations: Not on file    Relationship status: Not on file  . Intimate partner violence:    Fear of current or ex partner: Not on file    Emotionally abused: Not on file    Physically abused: Not on file    Forced sexual activity: Not on file  Other Topics Concern  . Not on file  Social History Narrative   Married, lives with spouse in [place]. 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   Please see review of systems for further details on the patient's review from today.   Objective:   Physical Exam:  LMP 05/11/2012   Physical Exam Neurological:     Mental Status: She is alert and oriented to person, place, and time.     Cranial Nerves: No dysarthria.  Psychiatric:        Attention and Perception: Attention normal. She is attentive. She does not perceive auditory or visual hallucinations.        Mood and Affect: Mood is anxious and depressed.        Speech: Speech normal.        Behavior: Behavior is cooperative.        Thought Content: Thought content normal. Thought content is not paranoid or delusional. Thought content does not include homicidal or suicidal ideation. Thought content does not include homicidal or suicidal plan.        Cognition and Memory: Cognition and memory normal.        Judgment: Judgment  normal.     Lab Review:     Component Value Date/Time   NA 138 12/03/2017 1408   NA 142 01/21/2017 1013   NA 143 01/16/2016 1005   K 3.3 12/03/2017 1408   K 3.2 (L) 01/21/2017 1013   K 3.3 (L) 01/16/2016 1005   CL 114 (H) 12/03/2017 1408   CL 106 01/21/2017 1013   CO2 23 12/03/2017 1408   CO2 29 01/21/2017 1013   CO2 20 (L)  01/16/2016 1005   GLUCOSE 128 (H) 12/03/2017 1408   GLUCOSE 95 01/21/2017 1013   BUN 8 12/03/2017 1408   BUN 9 01/21/2017 1013   BUN 12.6 01/16/2016 1005   CREATININE 1.10 12/03/2017 1408   CREATININE 0.9 01/21/2017 1013   CREATININE 0.9 01/16/2016 1005   CALCIUM 9.1 12/03/2017 1408   CALCIUM 9.2 01/21/2017 1013   CALCIUM 9.5 01/16/2016 1005   PROT 7.2 12/03/2017 1408   PROT 6.9 01/21/2017 1013   PROT 7.1 01/16/2016 1005   ALBUMIN 3.7 12/03/2017 1408   ALBUMIN 3.3 01/21/2017 1013   ALBUMIN 3.6 01/16/2016 1005   AST 26 12/03/2017 1408   AST 16 01/16/2016 1005   ALT 26 12/03/2017 1408   ALT 22 01/21/2017 1013   ALT 24 01/16/2016 1005   ALKPHOS 59 12/03/2017 1408   ALKPHOS 62 01/21/2017 1013   ALKPHOS 81 01/16/2016 1005   BILITOT 0.5 12/03/2017 1408   BILITOT 0.59 01/16/2016 1005   GFRNONAA >60 06/04/2017 1408   GFRAA >60 06/04/2017 1408       Component Value Date/Time   WBC 5.6 12/03/2017 1408   WBC 8.3 11/26/2017 1451   RBC 4.78 12/03/2017 1408   HGB 13.4 12/03/2017 1408   HGB 12.8 01/21/2017 1013   HCT 42.2 12/03/2017 1408   HCT 38.2 01/21/2017 1013   PLT 197 12/03/2017 1408   PLT 176 01/21/2017 1013   MCV 88.3 12/03/2017 1408   MCV 90 01/21/2017 1013   MCH 28.0 12/03/2017 1408   MCHC 31.8 12/03/2017 1408   RDW 14.6 12/03/2017 1408   RDW 14.5 01/21/2017 1013   LYMPHSABS 2.0 12/03/2017 1408   LYMPHSABS 3.6 (H) 01/21/2017 1013   MONOABS 0.3 12/03/2017 1408   EOSABS 0.1 12/03/2017 1408   EOSABS 0.5 01/21/2017 1013   BASOSABS 0.0 12/03/2017 1408   BASOSABS 0.0 01/21/2017 1013    No results found for: POCLITH, LITHIUM   Lab Results  Component Value Date   PHENYTOIN <0.5 ug/mL (L) 11/05/2006   VALPROATE 73.7 11/25/2016     .res Assessment: Plan:    Bipolar disorder with severe depression ([place])  Generalized anxiety disorder  Panic disorder with agoraphobia  Benign essential tremor   Lifelong history of chronic depression and anxiety with very poor  functioning continues.  She gets some benefit from the current medications.  She is not currently manic.  She is tolerating the medications.  She is had multiple medication failures as noted above.  She is fearful of trying new medications because of a history of suicidal thoughts on Vraylar.  She refuses med changes today.  Chronic severe depression and poor function and negative thinking.  Refuses olanzapine DT fear of weight gain.  Agrees to retry Buspirone   Disc SE. we will start at 10 mg twice daily and work up to 30 mg twice daily.  Increase activity.    Still seeing therapist.  This appt was 30 mins.    FU 3 mo  Lynder Parents, MD, DFAPA  Please see After Visit Summary for patient specific instructions.  Future Appointments  Date Time Provider [place]  05/12/2018  4:00 PM Bauert, Nicolasa Ducking, [place] [facility] None  05/26/2018  3:00 PM Bauert, Nicolasa Ducking, LCSW [facility] None  06/09/2018  3:00 PM Bauert, Nicolasa Ducking, LCSW [facility] None  12/01/2018  2:30 PM Salvadore Dom, MD [place] None  12/03/2018  1:45 PM [facility] None  12/03/2018  2:15 PM [place], Holli Humbles, NP [facility] None    No orders of the defined types were placed in this encounter.     -------------------------------

## 2018-05-12 ENCOUNTER — Ambulatory Visit (INDEPENDENT_AMBULATORY_CARE_PROVIDER_SITE_OTHER): Payer: 59 | Admitting: Psychology

## 2018-05-12 DIAGNOSIS — F331 Major depressive disorder, recurrent, moderate: Secondary | ICD-10-CM

## 2018-05-26 ENCOUNTER — Ambulatory Visit (INDEPENDENT_AMBULATORY_CARE_PROVIDER_SITE_OTHER): Payer: 59 | Admitting: Psychology

## 2018-05-26 DIAGNOSIS — F331 Major depressive disorder, recurrent, moderate: Secondary | ICD-10-CM

## 2018-05-31 MED FILL — DEXILANT DR 60 MG CAPSULE: 60 | 30 days supply | Qty: 30 | Fill #0

## 2018-06-09 ENCOUNTER — Ambulatory Visit (INDEPENDENT_AMBULATORY_CARE_PROVIDER_SITE_OTHER): Payer: 59 | Admitting: Psychology

## 2018-06-09 DIAGNOSIS — F331 Major depressive disorder, recurrent, moderate: Secondary | ICD-10-CM

## 2018-06-10 ENCOUNTER — Telehealth: Payer: Self-pay | Admitting: Pulmonary Disease

## 2018-06-10 ENCOUNTER — Other Ambulatory Visit: Payer: Self-pay | Admitting: Psychiatry

## 2018-06-10 ENCOUNTER — Other Ambulatory Visit: Payer: Self-pay | Admitting: Family Medicine

## 2018-06-10 DIAGNOSIS — I10 Essential (primary) hypertension: Secondary | ICD-10-CM

## 2018-06-10 MED FILL — SERTRALINE HCL 50 MG TABLET: 50 | 90 days supply | Qty: 90 | Fill #0

## 2018-06-10 NOTE — Telephone Encounter (Signed)
Patient states she has been having worsening DOE over the last year. She has symbicort 80 but is currently not taking it due to other conditions.  She knows of new address and will be in office at 1430 on 5/1.  Nothing further needed.

## 2018-06-11 ENCOUNTER — Ambulatory Visit (INDEPENDENT_AMBULATORY_CARE_PROVIDER_SITE_OTHER): Payer: 59 | Admitting: Pulmonary Disease

## 2018-06-11 ENCOUNTER — Other Ambulatory Visit: Payer: Self-pay

## 2018-06-11 ENCOUNTER — Encounter: Payer: Self-pay | Admitting: Pulmonary Disease

## 2018-06-11 VITALS — BP 116/68 | HR 60 | Temp 97.9°F | Ht 69.0 in | Wt 175.8 lb

## 2018-06-11 DIAGNOSIS — R0602 Shortness of breath: Secondary | ICD-10-CM | POA: Diagnosis not present

## 2018-06-11 DIAGNOSIS — F3181 Bipolar II disorder: Secondary | ICD-10-CM

## 2018-06-11 DIAGNOSIS — F332 Major depressive disorder, recurrent severe without psychotic features: Secondary | ICD-10-CM | POA: Diagnosis not present

## 2018-06-11 MED ORDER — PROPRANOLOL HCL 20 MG PO TABS: 20.0000 mg | ORAL_TABLET | Freq: Two times a day (BID) | ORAL | 1 refills | Status: DC

## 2018-06-11 MED FILL — DILTIAZEM 24HR ER 180 MG CA: 180 | 90 days supply | Qty: 90 | Fill #0

## 2018-06-11 MED FILL — PROPRANOLOL 20 MG TABLET: 20 | 90 days supply | Qty: 180 | Fill #0

## 2018-06-11 MED FILL — DIVALPROEX SOD ER 500 MG TA: 500 | 90 days supply | Qty: 180 | Fill #0

## 2018-06-11 NOTE — Progress Notes (Addendum)
@Patient  ID: Beverly Cole, female    DOB: Mar 08, 1960, 58 y.o.   MRN: [id number]  Chief Complaint  Patient presents with  . Follow-up    last seen 04/2016.  c/o worsening sob X1 year.  denies cough, mucus production, pnd, fever.      Referring provider: Carollee Herter, Alferd Apa, *  HPI:  58 year old female followed in our office for dyspnea on exertion  PMH: Bipolar, anxiety, depression, GERD, hypertension Smoker/ Smoking History: Former Smoker Maintenance:  Symbicort 32 - never started  Pt of: Dr. Elsworth Soho   06/11/2018  - Visit   58 year old female presenting to our office today for an acute visit.  Patient reports that she has had worsening shortness of breath over the last year.  She reports that she has been short of breath for the last 5 years.  She was recommended to start Symbicort 80 her PCP and she purchased this inhaler from the pharmacy but she never started it as she knows she has to rinse her mouth out after using it and she cannot bring herself to rinse her mouth out every day due to her depression.  She is reporting today that her shortness of breath typically worsens in the summer.  She wanted to present today to see what interventions can be made to help with her breathing.  Patient also has concerns regarding wearing a mask in public she feels that she may need oxygen in order to allow her to wear a mask.  She reports she gets too short of breath when she has to wear a mask due to the COVID-19 pandemic  Patient continues to have yearly follow-up with her primary care provider.  Patient continues to have follow-up with psychiatry every 3 months Dr. Clovis Pu.    Tests:   Echo 01/2016 nml LV function, mild MR, grade 1 DD  CT angio 04/2016 >> small BL effusions, neg PE  05/01/2016-spirometry-FVC 3.2 (80% predicted), ratio 82, FEV1 2.6 (84% predicted)  FENO:  No results found for: NITRICOXIDE  PFT: No flowsheet data found.  Imaging: No results found.    Specialty  Problems      Pulmonary Problems   Shortness of breath      Allergies  Allergen Reactions  . Cariprazine Other (See Comments)    Patient becomes suicidal when taking this medication.  Patient becomes suicidal when taking this medication.   . Metoprolol Other (See Comments)    Hallucinations hair falls out Hallucinations hair falls out  . Nickel Rash    Immunization History  Administered Date(s) Administered  . Influenza Inj Mdck Quad With Preservative 11/19/2017  . Influenza Split 03/04/2011  . Influenza Whole 02/06/2012, 11/01/2012  . Influenza,inj,Quad PF,6+ Mos 10/31/2014, 11/13/2015, 11/26/2016  . Influenza-Unspecified 10/27/2013, 11/25/2016  . PPD Test 03/23/2012, 04/05/2013, 05/16/2014  . Td 05/31/2002  . Tdap 05/13/2011  . Zoster Recombinat (Shingrix) 11/25/2016, 01/16/2017    Past Medical History:  Diagnosis Date  . Anxiety   . Atrial fibrillation ([place])    a. Event monitor 2013 - PACs/bradycardia/SVT/short run of atrial fib by event monitor.   Marland Kitchen BIPOLAR DISORDER UNSPECIFIED   . Bradycardia   . Bursitis of hip 09/1999   Bilateral - Dr. Berenice Primas  . DEPRESSION   . Emphysema lung ([place]) 06/20/2016   pt states pulmonalongist stated started recently  . GERD   . Headache(784.0)    was with menstrual cycle. no longer a problem  . HYPERLIPIDEMIA   . Hypertension   .  Hypertension   . IRRITABLE BOWEL SYNDROME, HX OF   . Menopausal state 01/2012   FSH = 88.5  . Mitral valve prolapse 12/17/2000   a. dx 1980s, most recent echo did not demonstrate this.  Marland Kitchen MYALGIA   . Normal coronary arteries    a. by cardiac CT 2013.  Marland Kitchen PAT (paroxysmal atrial tachycardia) ([place])   . PE (pulmonary embolism) 02/09/2015   a. Bilateral PEs 01/2015 when d-dimer 0.69, CP lying on left side. Followed by heme-onc - + lupus anticoagulant, mildly depressed protein S. Dr. Marin Olp is not certain if her hypercoagulable studies are significant for a thrombophilic state.  . Premature atrial  contractions   . Pulmonary embolus ([place]) 02/10/2015  . Shortness of breath    Pulmonary eval 05/2002  . Thyroid nodule 2014  . Tremors of nervous system     Tobacco History: Social History   Tobacco Use  Smoking Status Former Smoker  . Packs/day: 1.00  . Years: 25.00  . Pack years: 25.00  . Types: Cigarettes  . Last attempt to quit: 02/11/1996  . Years since quitting: 22.3  Smokeless Tobacco Never Used  Tobacco Comment   Married, lives with spouse. 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 ALPRAZolam (XANAX) 0.25 MG tablet TAKE 1 TO 2 TABLETS BY MOUTH TWICE DAILY AS NEEDED FOR ANXIETY  . aspirin EC 325 MG tablet Take 325 mg by mouth daily.  Marland Kitchen buPROPion (WELLBUTRIN SR) 200 MG 12 hr tablet TAKE 1 TABLET BY MOUTH DAILY IN THE MORNING  . CARTIA XT 180 MG 24 hr capsule TAKE 1 CAPSULE BY MOUTH DAILY.  Marland Kitchen DEXILANT 60 MG capsule Take 60 mg by mouth daily.  . divalproex (DEPAKOTE ER) 500 MG 24 hr tablet TAKE 2 TABLETS (1,000 MG TOTAL) BY MOUTH AT BEDTIME.  . fluticasone (FLONASE) 50 MCG/ACT nasal spray Place 2 sprays into the nose daily as needed. For allergies  . Nitrofurantoin Monohyd Macro (MACROBID PO) Take 1 capsule by mouth. After sex  . NONFORMULARY OR COMPOUNDED ITEM Azelaic acid; metronidazole / ivermectin cream  Bid  . NONFORMULARY OR COMPOUNDED ITEM Walker  #1  ---  Type per pt choice   Dx hx of falling  . Probiotic Product (PROBIOTIC DAILY PO) Take 1 tablet by mouth daily.   . propranolol (INDERAL) 20 MG tablet Take 20 mg by mouth 2 (two) times daily.  . sertraline (ZOLOFT) 50 MG tablet Take 1 tablet (50 mg total) by mouth daily.  Marland Kitchen tretinoin (RETIN-A) 0.025 % cream as needed.   . [DISCONTINUED] budesonide-formoterol (SYMBICORT) 80-4.5 MCG/ACT inhaler Inhale 2 puffs into the lungs 2  (two) times daily. (Patient not taking: Reported on 06/11/2018)  . [DISCONTINUED] busPIRone (BUSPAR) 30 MG tablet 1/3 twice daily for 1 week, then two thirds twice daily for 1 week, then 1 twice daily (Patient not taking: Reported on 06/11/2018)  . [DISCONTINUED] ciprofloxacin (CIPRO) 250 MG tablet Take 1 tablet (250 mg total) by mouth 2 (two) times daily. (Patient not taking: Reported on 05/11/2018)  . [DISCONTINUED] ketoconazole (NIZORAL) 2 % cream Apply 1 application topically daily. (Patient not taking: Reported on 05/11/2018)  . [DISCONTINUED] nitrofurantoin (MACRODANTIN) 50 MG capsule 1 tablet po prn intercourse (Patient not taking: Reported on 05/11/2018)  . [DISCONTINUED] phenazopyridine (PYRIDIUM) 200 MG tablet Take  1 tablet (200 mg total) by mouth 3 (three) times daily as needed for pain. (Patient not taking: Reported on 06/11/2018)  . [DISCONTINUED] Pyridoxine HCl (VITAMIN B-6) 250 MG tablet Take 250 mg by mouth 2 (two) times daily.   No facility-administered encounter medications on file as of 06/11/2018.      Review of Systems  Review of Systems  Constitutional: Negative for chills, fatigue, fever and unexpected weight change.  HENT: Negative for congestion, ear pain, postnasal drip, sinus pressure and sinus pain.   Respiratory: Positive for shortness of breath (chronic doe for 5 years). Negative for cough and wheezing.   Cardiovascular: Negative for chest pain and palpitations.  Gastrointestinal: Negative for blood in stool, diarrhea, nausea and vomiting.  Genitourinary: Negative for dysuria, frequency and urgency.  Musculoskeletal: Negative for arthralgias.  Skin: Negative for color change.  Allergic/Immunologic: Negative for environmental allergies and food allergies.  Neurological: Negative for dizziness, light-headedness and headaches.  Psychiatric/Behavioral: Negative for dysphoric mood. The patient is not nervous/anxious.   All other systems reviewed and are negative.     Physical Exam  BP 116/68 (BP Location: Left Arm, Cuff Size: Normal)   Pulse 60   Temp 97.9 F (36.6 C) (Oral)   Ht 5\' 9"  (1.753 m)   Wt 175 lb 12.8 oz (79.7 kg)   LMP 05/11/2012   SpO2 96%   BMI 25.96 kg/m   Wt Readings from Last 5 Encounters:  06/11/18 175 lb 12.8 oz (79.7 kg)  03/01/18 164 lb (74.4 kg)  01/11/18 162 lb (73.5 kg)  12/03/17 163 lb 1.9 oz (74 kg)  11/26/17 162 lb 3.2 oz (73.6 kg)     Physical Exam  Constitutional: She is oriented to person, place, and time and well-developed, well-nourished, and in no distress. No distress.  HENT:  Head: Normocephalic and atraumatic.  Right Ear: Hearing, tympanic membrane, external ear and ear canal normal.  Left Ear: Hearing, tympanic membrane, external ear and ear canal normal.  Nose: Nose normal. Right sinus exhibits no maxillary sinus tenderness and no frontal sinus tenderness. Left sinus exhibits no maxillary sinus tenderness and no frontal sinus tenderness.  Mouth/Throat: Uvula is midline.  Eyes: Pupils are equal, round, and reactive to light.  Neck: Normal range of motion. Neck supple.  Cardiovascular: Normal rate, regular rhythm and normal heart sounds.  Pulmonary/Chest: Effort normal and breath sounds normal. No accessory muscle usage. No respiratory distress. She has no decreased breath sounds. She has no wheezes. She has no rhonchi.  Abdominal: Soft. Bowel sounds are normal. There is no abdominal tenderness.  Musculoskeletal: Normal range of motion.        General: No edema.     Comments: Has cane  Lymphadenopathy:    She has no cervical adenopathy.  Neurological: She is alert and oriented to person, place, and time. Gait normal.  Skin: Skin is warm and dry. She is not diaphoretic.  Upper extremity nailbeds red and irritated, patient reports that she bites them often  Psychiatric: Memory and judgment normal. Her mood appears not anxious. She exhibits a depressed mood. She has a flat affect.  Nursing note and  vitals reviewed.     Lab Results:  CBC    Component Value Date/Time   WBC 5.6 12/03/2017 1408   WBC 8.3 11/26/2017 1451   RBC 4.78 12/03/2017 1408   HGB 13.4 12/03/2017 1408   HGB 12.8 01/21/2017 1013   HCT 42.2 12/03/2017 1408   HCT 38.2 01/21/2017 1013   PLT  197 12/03/2017 1408   PLT 176 01/21/2017 1013   MCV 88.3 12/03/2017 1408   MCV 90 01/21/2017 1013   MCH 28.0 12/03/2017 1408   MCHC 31.8 12/03/2017 1408   RDW 14.6 12/03/2017 1408   RDW 14.5 01/21/2017 1013   LYMPHSABS 2.0 12/03/2017 1408   LYMPHSABS 3.6 (H) 01/21/2017 1013   MONOABS 0.3 12/03/2017 1408   EOSABS 0.1 12/03/2017 1408   EOSABS 0.5 01/21/2017 1013   BASOSABS 0.0 12/03/2017 1408   BASOSABS 0.0 01/21/2017 1013    BMET    Component Value Date/Time   NA 138 12/03/2017 1408   NA 142 01/21/2017 1013   NA 143 01/16/2016 1005   K 3.3 12/03/2017 1408   K 3.2 (L) 01/21/2017 1013   K 3.3 (L) 01/16/2016 1005   CL 114 (H) 12/03/2017 1408   CL 106 01/21/2017 1013   CO2 23 12/03/2017 1408   CO2 29 01/21/2017 1013   CO2 20 (L) 01/16/2016 1005   GLUCOSE 128 (H) 12/03/2017 1408   GLUCOSE 95 01/21/2017 1013   BUN 8 12/03/2017 1408   BUN 9 01/21/2017 1013   BUN 12.6 01/16/2016 1005   CREATININE 1.10 12/03/2017 1408   CREATININE 0.9 01/21/2017 1013   CREATININE 0.9 01/16/2016 1005   CALCIUM 9.1 12/03/2017 1408   CALCIUM 9.2 01/21/2017 1013   CALCIUM 9.5 01/16/2016 1005   GFRNONAA >60 06/04/2017 1408   GFRAA >60 06/04/2017 1408    BNP    Component Value Date/Time   BNP 11.4 02/09/2015 1055    ProBNP No results found for: PROBNP    Assessment & Plan:     Shortness of breath Assessment: Patient reporting dyspnea on exertion for the last 5 years Patient reporting is worsened over the last year Patient denies orthopnea today Lung sounds are clear to auscultation No significant obstructive lung disease Chronic anxiety and depression Patient reporting that she feels like she needs oxygen  because of having to wear a mask out in public due to the [id number] pandemic  Plan: Benign exam.  Dyspnea on exertion is likely related to anxiety as well as sedentary lifestyle.  Walk today in office Trial of Symbicort 80 2 puffs as needed for shortness of breath and wheezing, rinse mouth out after, can use every 12 hours Do not exceed more than 4 puffs of Symbicort 80 in a day Increase physical activity Keep follow-up with primary care Follow-up with our office as needed  Severe recurrent major depression without psychotic features ([place]) Plan: Continue to follow-up with Dr. Clovis Pu Please notify Dr. Beryle Quant office that you have stopped taking Buspar on your own  Bipolar 2 disorder [facility]) Plan:  Continue follow up with Dr. Darcella Gasman Psych that you stopped buspar on your own      Lauraine Rinne, NP 06/11/2018   This appointment was 26 min with over 50% of the time in direct face-to-face patient care, assessment, plan of care, and follow-up.

## 2018-06-11 NOTE — Assessment & Plan Note (Signed)
Plan: Continue to follow-up with Dr. Clovis Pu Please notify Dr. Beryle Quant office that you have stopped taking Buspar on your own

## 2018-06-11 NOTE — Patient Instructions (Addendum)
Walk today in office  >>> dropped to 93 percent when walking  >>>did not qualify for oxygen   I hope the use of the cotton mask will be better for you  Trial Symbicort 80 >>> 2 puffs in the morning right when you wake up, rinse out your mouth after use, 12 hours later 2 puffs, rinse after use >>> Take this as needed , do not exceed two doses daily >>> This is not a rescue inhaler    Keep follow up with PCP  Keep follow up with Dr. Clovis Pu for management depression / anxiety  >>>please notify that you stopped buspar on your own   Return if symptoms worsen or fail to improve, for Follow up with Dr. Elsworth Soho.     Coronavirus (COVID-19) Are you at risk?  Are you at risk for the Coronavirus (COVID-19)?  To be considered HIGH RISK for Coronavirus (COVID-19), you have to meet the following criteria:  . Traveled to [place], [place], [place], [place] or [place]; or in the [place] to [place], [place], [place], or [place]; and have fever, cough, and shortness of breath within the last 2 weeks of travel OR . Been in close contact with a person diagnosed with COVID-19 within the last 2 weeks and have fever, cough, and shortness of breath . IF YOU DO NOT MEET THESE CRITERIA, YOU ARE CONSIDERED LOW RISK FOR COVID-19.  What to do if you are HIGH RISK for COVID-19?  Marland Kitchen If you are having a medical emergency, call 911. . Seek medical care right away. Before you go to a doctor's office, urgent care or emergency department, call ahead and tell them about your recent travel, contact with someone diagnosed with COVID-19, and your symptoms. You should receive instructions from your physician's office regarding next steps of care.  . When you arrive at healthcare provider, tell the healthcare staff immediately you have returned from visiting [place], [place], [place], [place] or [place]; or traveled in the [place] to [place], [place], [place], or [place]; in the last two weeks or you have  been in close contact with a person diagnosed with COVID-19 in the last 2 weeks.   . Tell the health care staff about your symptoms: fever, cough and shortness of breath. . After you have been seen by a medical provider, you will be either: o Tested for (COVID-19) and discharged home on quarantine except to seek medical care if symptoms worsen, and asked to  - Stay home and avoid contact with others until you get your results (4-5 days)  - Avoid travel on public transportation if possible (such as bus, train, or airplane) or o Sent to the Emergency Department by EMS for evaluation, COVID-19 testing, and possible admission depending on your condition and test results.  What to do if you are LOW RISK for COVID-19?  Reduce your risk of any infection by using the same precautions used for avoiding the common cold or flu:  Marland Kitchen Wash your hands often with soap and warm water for at least 20 seconds.  If soap and water are not readily available, use an alcohol-based hand sanitizer with at least 60% alcohol.  . If coughing or sneezing, cover your mouth and nose by coughing or sneezing into the elbow areas of your shirt or coat, into a tissue or into your sleeve (not your hands). . Avoid shaking hands with others and consider head nods or verbal greetings only. . Avoid touching your  eyes, nose, or mouth with unwashed hands.  . Avoid close contact with people who are sick. . Avoid places or events with large numbers of people in one location, like concerts or sporting events. . Carefully consider travel plans you have or are making. . If you are planning any travel outside or inside the [place], visit the CDC's Travelers' Health webpage for the latest health notices. . If you have some symptoms but not all symptoms, continue to monitor at home and seek medical attention if your symptoms worsen. . If you are having a medical emergency, call 911.   [place] / e-Visit: [email or web address]         [facility]: [facility]: [phone]                   [facility]: [phone]           It is flu season:   >>> Best ways to protect herself from the flu: Receive the yearly flu vaccine, practice good hand hygiene washing with soap and also using hand sanitizer when available, eat a nutritious meals, get adequate rest, hydrate appropriately   Please contact the office if your symptoms worsen or you have concerns that you are not improving.   Thank you for choosing [facility] for your healthcare, and for allowing [place] to partner with you on your healthcare journey. I am thankful to be able to provide care to you today.   Wyn Quaker FNP-C

## 2018-06-11 NOTE — Assessment & Plan Note (Signed)
Plan:  Continue follow up with Dr. Darcella Gasman Psych that you stopped buspar on your own

## 2018-06-11 NOTE — Assessment & Plan Note (Signed)
Assessment: Patient reporting dyspnea on exertion for the last 5 years Patient reporting is worsened over the last year Patient denies orthopnea today Lung sounds are clear to auscultation No significant obstructive lung disease Chronic anxiety and depression Patient reporting that she feels like she needs oxygen because of having to wear a mask out in public due to the [id number] pandemic  Plan: Benign exam.  Dyspnea on exertion is likely related to anxiety as well as sedentary lifestyle.  Walk today in office Trial of Symbicort 80 2 puffs as needed for shortness of breath and wheezing, rinse mouth out after, can use every 12 hours Do not exceed more than 4 puffs of Symbicort 80 in a day Increase physical activity Keep follow-up with primary care Follow-up with our office as needed

## 2018-06-16 ENCOUNTER — Telehealth: Payer: Self-pay | Admitting: Psychiatry

## 2018-06-16 NOTE — Telephone Encounter (Signed)
Received note from [place] practitioner in pulmonary medicine at [place].  With normal lung function but clearly anxious about Covid.  Stating that she may need oxygen therapy in order to wear a mask in public.  This was clearly not medically indicated.  They felt it was a function of anxiety and had noted that the patient stopped buspirone on her own.

## 2018-06-16 NOTE — Telephone Encounter (Signed)
Patient called and said that she had a problem with the buspar. She had muscle spasms in her hands and arms and 2 hallicinations. She thinks it is from all the other meds she is on. She weaned off the medicine and is doing well. Just wanted you to know.

## 2018-06-23 ENCOUNTER — Ambulatory Visit (INDEPENDENT_AMBULATORY_CARE_PROVIDER_SITE_OTHER): Payer: 59 | Admitting: Psychology

## 2018-06-23 DIAGNOSIS — F331 Major depressive disorder, recurrent, moderate: Secondary | ICD-10-CM

## 2018-06-24 MED FILL — DEXILANT DR 60 MG CAPSULE: 60 | 30 days supply | Qty: 30 | Fill #0

## 2018-07-07 ENCOUNTER — Ambulatory Visit (INDEPENDENT_AMBULATORY_CARE_PROVIDER_SITE_OTHER): Payer: 59 | Admitting: Psychology

## 2018-07-07 DIAGNOSIS — F331 Major depressive disorder, recurrent, moderate: Secondary | ICD-10-CM

## 2018-07-20 MED FILL — DEXILANT DR 60 MG CAPSULE: 60 | 30 days supply | Qty: 30 | Fill #0

## 2018-07-21 ENCOUNTER — Ambulatory Visit (INDEPENDENT_AMBULATORY_CARE_PROVIDER_SITE_OTHER): Payer: 59 | Admitting: Psychology

## 2018-07-21 DIAGNOSIS — F331 Major depressive disorder, recurrent, moderate: Secondary | ICD-10-CM | POA: Diagnosis not present

## 2018-07-29 ENCOUNTER — Other Ambulatory Visit: Payer: Self-pay | Admitting: Psychiatry

## 2018-07-29 MED FILL — BUPROPION HCL SR 200 MG TAB: 200 | 90 days supply | Qty: 90 | Fill #0

## 2018-07-31 IMAGING — NM NM MISC PROCEDURE
3 series · 18 of 18 positions shown · non-contrast
Comparison: none

[Series 1: stress-gsp_(id)_sa · 6.4mm · 6.40mm/px · 6 of 512 frames shown]
[frame 43/512]
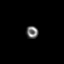
[frame 128/512]
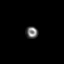
[frame 214/512]
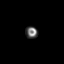
[frame 299/512]
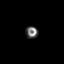
[frame 384/512]
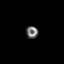
[frame 470/512]
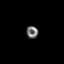

[Series 1: rest_(id)_sa · 6.4mm · 6.40mm/px · 6 of 64 frames shown]
[frame 6/64]
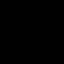
[frame 16/64]
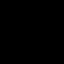
[frame 27/64]
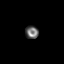
[frame 38/64]
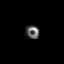
[frame 48/64]
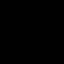
[frame 59/64]
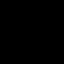

[Series 1: stress-sum-em_(id)_sa · 6.4mm · 6.40mm/px · 6 of 64 frames shown]
[frame 6/64]
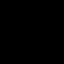
[frame 16/64]
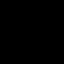
[frame 27/64]
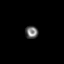
[frame 38/64]
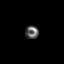
[frame 48/64]
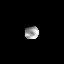
[frame 59/64]
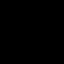

[18 of 18 positions shown; findings below may reference images not displayed]

Canned report from images found in remote index.

Refer to host system for actual result text.

## 2018-08-04 ENCOUNTER — Ambulatory Visit (INDEPENDENT_AMBULATORY_CARE_PROVIDER_SITE_OTHER): Payer: 59 | Admitting: Psychology

## 2018-08-04 DIAGNOSIS — F331 Major depressive disorder, recurrent, moderate: Secondary | ICD-10-CM | POA: Diagnosis not present

## 2018-08-09 ENCOUNTER — Encounter: Payer: Self-pay | Admitting: Psychiatry

## 2018-08-09 ENCOUNTER — Ambulatory Visit (INDEPENDENT_AMBULATORY_CARE_PROVIDER_SITE_OTHER): Payer: 59 | Admitting: Psychiatry

## 2018-08-09 DIAGNOSIS — F411 Generalized anxiety disorder: Secondary | ICD-10-CM

## 2018-08-09 DIAGNOSIS — G250 Essential tremor: Secondary | ICD-10-CM

## 2018-08-09 DIAGNOSIS — F40233 Fear of injury: Secondary | ICD-10-CM

## 2018-08-09 DIAGNOSIS — F4001 Agoraphobia with panic disorder: Secondary | ICD-10-CM | POA: Diagnosis not present

## 2018-08-09 DIAGNOSIS — F314 Bipolar disorder, current episode depressed, severe, without psychotic features: Secondary | ICD-10-CM | POA: Diagnosis not present

## 2018-08-09 DIAGNOSIS — F423 Hoarding disorder: Secondary | ICD-10-CM | POA: Diagnosis not present

## 2018-08-09 NOTE — Progress Notes (Signed)
Beverly Cole [id number] 05/16/1960 58 y.o.  Subjective:   Patient ID:  Beverly Cole is a 58 y.o. (DOB 04/06/1960) female.  Chief Complaint:  No chief complaint on file.   Depression        Associated symptoms include no decreased concentration and no suicidal ideas.  [name]  today for follow-up of chronic depression and anxiety.  Lately depression worse than anxiety.  Last seen May 11, 2018.  For persistent anxiety we elected to retry buspirone and try increasing it to 30 mg twice daily if tolerated.  Couldn't tolerate it this time DT muscle spasms.  Pharmacy called saying she could have serotonin syndrome.  Saw pulmonologist over weakness and he says she's OK except deconditioned.  Can't tolerate wearing the masks.  Pt reports that mood is unstable up and down without mania  Anxious and Depressed and describes anxiety as Moderate. Anxiety symptoms include: Excessive Worry,. Depressive cycles last a few weeks. Mood swings predate Zoloft.  She thinks it reduces her obsessiveness.  Past hx panic so avoidant. Never get to relax.  Pt reports sleeps excessively. Pt reports that appetite is good. Pt reports that energy is poor and loss of interest or pleasure in usual activities, poor motivation and withdrawn from usual activities. Just don't care about things and admits to a lot of general anxiety and everything takes a lot of energy out of her.  Compulsively tapes and watches TV shows, news and awards shows.  Can't delete it bc I'll miss something.  Watches TV in bed. Concentration is poor. Suicidal thoughts:  denied by patient. But chronic death thoughts.  Poor productivity overall.  Chronic poor self care, showers only weekly.  Just don't care.  Chronic disability.  She thinks sertraline helped the anxiety some.  Likes that H is at home.  Helps her mood.  Doesn't care about going out.  Feels faint when she tries to wear a mask.  Too anxious driving and is avoidant.  Collects TV  shows, she can't erase unless she watches entirely.  Rare use of Xanax bc fears addiction.  Afraid to try new meds after negative reaction to Vraylar.  Past psych med trials: Wellbutrin XL 300, ECT 2016 without help, Vraylar SI, pramipexole, Seroquel which caused side effects of constipation, Abilify , lamotrigine, Latuda, and refuses lithium because of altered taste.  She has a history of ataxia on 1500 mg of Depakote.  Remotely took buspirone and thinks that helped her.  Review of Systems:  Review of Systems  Respiratory: Positive for shortness of breath.   Neurological: Negative for tremors and weakness.  Psychiatric/Behavioral: Positive for depression, dysphoric mood and sleep disturbance. Negative for agitation, behavioral problems, confusion, decreased concentration, hallucinations, self-injury and suicidal ideas. The patient is nervous/anxious. The patient is not hyperactive.     Medications: I have reviewed the patient's current medications.  Current Outpatient Medications  Medication Sig Dispense Refill  . ALPRAZolam (XANAX) 0.25 MG tablet TAKE 1 TO 2 TABLETS BY MOUTH TWICE DAILY AS NEEDED FOR ANXIETY  1  . aspirin EC 325 MG tablet Take 325 mg by mouth daily.    Marland Kitchen buPROPion (WELLBUTRIN SR) 200 MG 12 hr tablet TAKE 1 TABLET BY MOUTH DAILY IN THE MORNING 90 tablet 0  . CARTIA XT 180 MG 24 hr capsule TAKE 1 CAPSULE BY MOUTH DAILY. 90 capsule 1  . DEXILANT 60 MG capsule Take 60 mg by mouth daily.  12  . divalproex (DEPAKOTE ER) 500 MG  24 hr tablet TAKE 2 TABLETS (1,000 MG TOTAL) BY MOUTH AT BEDTIME. 180 tablet 0  . fluticasone (FLONASE) 50 MCG/ACT nasal spray Place 2 sprays into the nose daily as needed. For allergies    . Nitrofurantoin Monohyd Macro (MACROBID PO) Take 1 capsule by mouth. After sex    . NONFORMULARY OR COMPOUNDED ITEM Azelaic acid; metronidazole / ivermectin cream  Bid    . NONFORMULARY OR COMPOUNDED ITEM Walker  #1  ---  Type per pt choice   Dx hx of falling 1 each  0  . Probiotic Product (PROBIOTIC DAILY PO) Take 1 tablet by mouth daily.     . propranolol (INDERAL) 20 MG tablet Take 1 tablet (20 mg total) by mouth 2 (two) times daily. 180 tablet 1  . sertraline (ZOLOFT) 50 MG tablet Take 1 tablet (50 mg total) by mouth daily. 90 tablet 1  . tretinoin (RETIN-A) 0.025 % cream as needed.   4   No current facility-administered medications for this visit.     Medication Side Effects: None  Allergies:  Allergies  Allergen Reactions  . Cariprazine Other (See Comments)    Patient becomes suicidal when taking this medication.  Patient becomes suicidal when taking this medication.   . Metoprolol Other (See Comments)    Hallucinations hair falls out Hallucinations hair falls out  . Nickel Rash    Past Medical History:  Diagnosis Date  . Anxiety   . Atrial fibrillation ([place])    a. Event monitor 2013 - PACs/bradycardia/SVT/short run of atrial fib by event monitor.   Marland Kitchen BIPOLAR DISORDER UNSPECIFIED   . Bradycardia   . Bursitis of hip 09/1999   Bilateral - Dr. Berenice Primas  . DEPRESSION   . Emphysema lung ([place]) 06/20/2016   pt states pulmonalongist stated started recently  . GERD   . Headache(784.0)    was with menstrual cycle. no longer a problem  . HYPERLIPIDEMIA   . Hypertension   . Hypertension   . IRRITABLE BOWEL SYNDROME, HX OF   . Menopausal state 01/2012   FSH = 88.5  . Mitral valve prolapse 12/17/2000   a. dx 1980s, most recent echo did not demonstrate this.  Marland Kitchen MYALGIA   . Normal coronary arteries    a. by cardiac CT 2013.  Marland Kitchen PAT (paroxysmal atrial tachycardia) ([place])   . PE (pulmonary embolism) 02/09/2015   a. Bilateral PEs 01/2015 when d-dimer 0.69, CP lying on left side. Followed by heme-onc - + lupus anticoagulant, mildly depressed protein S. Dr. Marin Olp is not certain if her hypercoagulable studies are significant for a thrombophilic state.  . Premature atrial contractions   . Pulmonary embolus ([place]) 02/10/2015  . Shortness of breath     Pulmonary eval 05/2002  . Thyroid nodule 2014  . Tremors of nervous system     Family History  Problem Relation Age of Onset  . Lung cancer Mother   . Hypertension Mother   . Hyperlipidemia Mother   . Cancer Father        Esophageal cancer  . Hyperlipidemia Father   . Depression Father   . Cancer Brother   . Pulmonary embolism Brother   . Colon cancer Paternal Uncle   . Sarcoidosis Other   . Testicular cancer Other     Social History   Socioeconomic History  . Marital status: Married    Spouse name: Not on file  . Number of children: 0  . Years of education: Not on file  . Highest  education level: Not on file  Occupational History  . Occupation: Nurse-Personal Care/HH    Employer: UNEMPLOYED  Social Needs  . Financial resource strain: Not on file  . Food insecurity    Worry: Not on file    Inability: Not on file  . Transportation needs    Medical: Not on file    Non-medical: Not on file  Tobacco Use  . Smoking status: Former Smoker    Packs/day: 1.00    Years: 25.00    Pack years: 25.00    Types: Cigarettes    Quit date: 02/11/1996    Years since quitting: 22.5  . Smokeless tobacco: Never Used  . Tobacco comment: Married, lives with spouse. 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 Alcohol use: Yes    Comment: occ  . Drug use: No  . Sexual activity: Yes    Partners: Male    Birth control/protection: Post-menopausal  Lifestyle  . Physical activity    Days per week: Not on file    Minutes per session: Not on file  . Stress: Not on file  Relationships  . Social Herbalist on phone: Not on file    Gets together: Not on file    Attends religious service: Not on file    Active member of club or organization: Not on file    Attends meetings of clubs or organizations: Not on file    Relationship status: Not on file  . Intimate partner violence    Fear of current or ex partner: Not on file    Emotionally  abused: Not on file    Physically abused: Not on file    Forced sexual activity: Not on file  Other Topics Concern  . Not on file  Social History Narrative   Married, lives with spouse in [place]. 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   Please see review of systems for further details on the patient's review from today.   Objective:   Physical Exam:  LMP 05/11/2012   Physical Exam Neurological:     Mental Status: She is alert and oriented to person, place, and time.     Cranial Nerves: No dysarthria.  Psychiatric:        Attention and Perception: Attention normal. She is attentive. She does not perceive auditory or visual hallucinations.        Mood and Affect: Mood is anxious and depressed.        Speech: Speech normal.        Behavior: Behavior is cooperative.        Thought Content: Thought content normal. Thought content is not paranoid or delusional. Thought content does not include homicidal or suicidal ideation. Thought content does not include homicidal or suicidal plan.        Cognition and Memory: Cognition and memory normal.     Comments: Insight is fair at best.  She tends to contradict herself at times about her symptoms.  Judgment fair     Lab Review:     Component Value Date/Time   NA 138 12/03/2017 1408   NA 142 01/21/2017 1013   NA 143 01/16/2016 1005   K 3.3 12/03/2017 1408   K 3.2 (L)  01/21/2017 1013   K 3.3 (L) 01/16/2016 1005   CL 114 (H) 12/03/2017 1408   CL 106 01/21/2017 1013   CO2 23 12/03/2017 1408   CO2 29 01/21/2017 1013   CO2 20 (L) 01/16/2016 1005   GLUCOSE 128 (H) 12/03/2017 1408   GLUCOSE 95 01/21/2017 1013   BUN 8 12/03/2017 1408   BUN 9 01/21/2017 1013   BUN 12.6 01/16/2016 1005   CREATININE 1.10 12/03/2017 1408   CREATININE 0.9 01/21/2017 1013    CREATININE 0.9 01/16/2016 1005   CALCIUM 9.1 12/03/2017 1408   CALCIUM 9.2 01/21/2017 1013   CALCIUM 9.5 01/16/2016 1005   PROT 7.2 12/03/2017 1408   PROT 6.9 01/21/2017 1013   PROT 7.1 01/16/2016 1005   ALBUMIN 3.7 12/03/2017 1408   ALBUMIN 3.3 01/21/2017 1013   ALBUMIN 3.6 01/16/2016 1005   AST 26 12/03/2017 1408   AST 16 01/16/2016 1005   ALT 26 12/03/2017 1408   ALT 22 01/21/2017 1013   ALT 24 01/16/2016 1005   ALKPHOS 59 12/03/2017 1408   ALKPHOS 62 01/21/2017 1013   ALKPHOS 81 01/16/2016 1005   BILITOT 0.5 12/03/2017 1408   BILITOT 0.59 01/16/2016 1005   GFRNONAA >60 06/04/2017 1408   GFRAA >60 06/04/2017 1408       Component Value Date/Time   WBC 5.6 12/03/2017 1408   WBC 8.3 11/26/2017 1451   RBC 4.78 12/03/2017 1408   HGB 13.4 12/03/2017 1408   HGB 12.8 01/21/2017 1013   HCT 42.2 12/03/2017 1408   HCT 38.2 01/21/2017 1013   PLT 197 12/03/2017 1408   PLT 176 01/21/2017 1013   MCV 88.3 12/03/2017 1408   MCV 90 01/21/2017 1013   MCH 28.0 12/03/2017 1408   MCHC 31.8 12/03/2017 1408   RDW 14.6 12/03/2017 1408   RDW 14.5 01/21/2017 1013   LYMPHSABS 2.0 12/03/2017 1408   LYMPHSABS 3.6 (H) 01/21/2017 1013   MONOABS 0.3 12/03/2017 1408   EOSABS 0.1 12/03/2017 1408   EOSABS 0.5 01/21/2017 1013   BASOSABS 0.0 12/03/2017 1408   BASOSABS 0.0 01/21/2017 1013    No results found for: POCLITH, LITHIUM   Lab Results  Component Value Date   PHENYTOIN <0.5 ug/mL (L) 11/05/2006   VALPROATE 73.7 11/25/2016     .res Assessment: Plan:    Beverly Cole was seen today for anxiety, depression and follow-up.  Diagnoses and all orders for this visit:  Bipolar disorder with severe depression ([place])  Generalized anxiety disorder  Panic disorder with agoraphobia  Benign essential tremor  Fear of injury  Hoarding behavior   Poor sleep hygiene.  Lifelong history of chronic depression and anxiety with very poor functioning continues.  Prognosis is guarded bc multiple  med failures and her resistance to change and chronicity of sx and low motivation for change.  She gets some benefit from the current medications.  She is not currently manic.  She is tolerating the medications.  She is had multiple medication failures as noted above.  She is fearful of trying new medications because of a history of suicidal thoughts on Vraylar.  She refuses med changes today.  Chronic severe depression and poor function and negative thinking.  Refuses olanzapine DT fear of weight gain.there are other atypical antipsychotic mood stabilizers that could be considered but she refuses.  Has gained 15# since Covid.  New cats enjoys them..  Disc use of the clear mask with the headband instead of the mask.  Claims she gets short of breath  when she tries to use a regular mask.  She denies his claustrophobia but it does actually appear to be some degree of claustrophobia.  Increase activity.  Work on sleep schedule and try to regulate it for overall mental health benefits.  Refuses med changes.  Still seeing therapist Arville Lime.  This appt was 30 mins.  FU 3 mo  Lynder Parents, MD, DFAPA   Please see After Visit Summary for patient specific instructions.  Future Appointments  Date Time Provider [place]  08/18/2018  3:00 PM Bauert, Nicolasa Ducking, LCSW [facility] None  09/01/2018  3:00 PM Bauert, Nicolasa Ducking, LCSW [facility] None  12/01/2018  2:30 PM Salvadore Dom, MD [place] None  12/03/2018  1:45 PM [facility] None  12/03/2018  2:15 PM [place], Holli Humbles, NP [facility] None    No orders of the defined types were placed in this encounter.     -------------------------------

## 2018-08-18 ENCOUNTER — Ambulatory Visit (INDEPENDENT_AMBULATORY_CARE_PROVIDER_SITE_OTHER): Payer: 59 | Admitting: Psychology

## 2018-08-18 DIAGNOSIS — F331 Major depressive disorder, recurrent, moderate: Secondary | ICD-10-CM | POA: Diagnosis not present

## 2018-09-01 ENCOUNTER — Ambulatory Visit: Payer: 59 | Admitting: Psychology

## 2018-09-07 ENCOUNTER — Other Ambulatory Visit: Payer: Self-pay | Admitting: Psychiatry

## 2018-09-07 DIAGNOSIS — K5730 Diverticulosis of large intestine without perforation or abscess without bleeding: Secondary | ICD-10-CM | POA: Diagnosis not present

## 2018-09-07 DIAGNOSIS — K219 Gastro-esophageal reflux disease without esophagitis: Secondary | ICD-10-CM | POA: Diagnosis not present

## 2018-09-07 DIAGNOSIS — Z800 Family history of malignant neoplasm of digestive organs: Secondary | ICD-10-CM | POA: Diagnosis not present

## 2018-09-07 DIAGNOSIS — K5900 Constipation, unspecified: Secondary | ICD-10-CM | POA: Diagnosis not present

## 2018-09-07 MED FILL — CARTIA XT 180 MG CAPSULE SA: 180 | 90 days supply | Qty: 90 | Fill #0

## 2018-09-07 MED FILL — PROPRANOLOL 20 MG TABLET: 20 | 90 days supply | Qty: 180 | Fill #0

## 2018-09-08 ENCOUNTER — Encounter: Payer: Self-pay | Admitting: Family Medicine

## 2018-09-08 MED FILL — DEXILANT DR 60 MG CAPSULE: 60 | 30 days supply | Qty: 30 | Fill #0

## 2018-09-08 MED FILL — SERTRALINE HCL 50 MG TABLET: 50 | 90 days supply | Qty: 90 | Fill #0

## 2018-09-08 MED FILL — DIVALPROEX SOD ER 500 MG TA: 500 | 90 days supply | Qty: 180 | Fill #0

## 2018-09-09 ENCOUNTER — Other Ambulatory Visit: Payer: Self-pay | Admitting: Family Medicine

## 2018-09-09 DIAGNOSIS — E039 Hypothyroidism, unspecified: Secondary | ICD-10-CM

## 2018-09-09 NOTE — Telephone Encounter (Signed)
She will have to ask Dr Collene Mares to send them to [place] ---  May need a release I would like to do a thyroid panel --- i'll put the order in

## 2018-09-09 NOTE — Telephone Encounter (Signed)
I need to do more labs and we need to get the labs from Dr Collene Mares I have ordered thyroid panel

## 2018-09-10 ENCOUNTER — Other Ambulatory Visit: Payer: Self-pay

## 2018-09-10 ENCOUNTER — Other Ambulatory Visit (INDEPENDENT_AMBULATORY_CARE_PROVIDER_SITE_OTHER): Payer: 59

## 2018-09-10 DIAGNOSIS — E039 Hypothyroidism, unspecified: Secondary | ICD-10-CM

## 2018-09-11 LAB — THYROID PANEL WITH TSH
Free Thyroxine Index: 1.8 (ref 1.4–3.8)
T3 Uptake: 29 % (ref 22–35)
T4, Total: 6.2 ug/dL (ref 5.1–11.9)
TSH: 4.45 mIU/L (ref 0.40–4.50)

## 2018-09-15 ENCOUNTER — Ambulatory Visit (INDEPENDENT_AMBULATORY_CARE_PROVIDER_SITE_OTHER): Payer: 59 | Admitting: Psychology

## 2018-09-15 DIAGNOSIS — F331 Major depressive disorder, recurrent, moderate: Secondary | ICD-10-CM

## 2018-09-29 ENCOUNTER — Ambulatory Visit (INDEPENDENT_AMBULATORY_CARE_PROVIDER_SITE_OTHER): Payer: 59 | Admitting: Psychology

## 2018-09-29 DIAGNOSIS — F331 Major depressive disorder, recurrent, moderate: Secondary | ICD-10-CM | POA: Diagnosis not present

## 2018-10-20 ENCOUNTER — Ambulatory Visit (INDEPENDENT_AMBULATORY_CARE_PROVIDER_SITE_OTHER): Payer: 59 | Admitting: Psychology

## 2018-10-20 DIAGNOSIS — F331 Major depressive disorder, recurrent, moderate: Secondary | ICD-10-CM | POA: Diagnosis not present

## 2018-10-21 ENCOUNTER — Other Ambulatory Visit: Payer: Self-pay | Admitting: Psychiatry

## 2018-10-22 MED FILL — DEXILANT DR 60 MG CAPSULE: 60 | 30 days supply | Qty: 30 | Fill #1

## 2018-10-27 MED FILL — BUPROPION HCL SR 200 MG TAB: 200 | 90 days supply | Qty: 90 | Fill #0

## 2018-11-03 ENCOUNTER — Ambulatory Visit (INDEPENDENT_AMBULATORY_CARE_PROVIDER_SITE_OTHER): Payer: 59 | Admitting: Psychology

## 2018-11-03 DIAGNOSIS — F331 Major depressive disorder, recurrent, moderate: Secondary | ICD-10-CM

## 2018-11-04 ENCOUNTER — Other Ambulatory Visit: Payer: Self-pay | Admitting: Obstetrics and Gynecology

## 2018-11-04 DIAGNOSIS — Z1231 Encounter for screening mammogram for malignant neoplasm of breast: Secondary | ICD-10-CM

## 2018-11-17 ENCOUNTER — Ambulatory Visit (INDEPENDENT_AMBULATORY_CARE_PROVIDER_SITE_OTHER): Payer: 59 | Admitting: Psychology

## 2018-11-17 DIAGNOSIS — F331 Major depressive disorder, recurrent, moderate: Secondary | ICD-10-CM | POA: Diagnosis not present

## 2018-11-17 MED FILL — DEXILANT DR 60 MG CAPSULE: 60 | 30 days supply | Qty: 30 | Fill #2

## 2018-11-29 NOTE — Progress Notes (Deleted)
58 y.o. G0P0 Married White or Caucasian Not Hispanic or Latino female here for annual exam.      Patient's last menstrual period was 05/11/2012.          Sexually active: {yes no:314532}  The current method of family planning is {contraception:315051}.    Exercising: {yes no:314532}  {types:19826} Smoker:  {YES NO:22349}  Health Maintenance: Pap: 07/02/2017 WNL,  06-17-16 Neg, 06-05-15 Neg:Neg HR HPV History of abnormal Pap:  Yes, Hx cryotherapy years ago MMG:  11-12-16 Density C/Neg/BiRads1 Colonoscopy: 10-19-15 normal BMD:   never TDaP:  05-13-11 Gardasil: no   reports that she quit smoking about 22 years ago. Her smoking use included cigarettes. She has a 25.00 pack-year smoking history. She has never used smokeless tobacco. She reports current alcohol use. She reports that she does not use drugs.  Past Medical History:  Diagnosis Date  . Anxiety   . Atrial fibrillation ([place])    a. Event monitor 2013 - PACs/bradycardia/SVT/short run of atrial fib by event monitor.   Marland Kitchen BIPOLAR DISORDER UNSPECIFIED   . Bradycardia   . Bursitis of hip 09/1999   Bilateral - Dr. Berenice Primas  . DEPRESSION   . Emphysema lung ([place]) 06/20/2016   pt states pulmonalongist stated started recently  . GERD   . Headache(784.0)    was with menstrual cycle. no longer a problem  . HYPERLIPIDEMIA   . Hypertension   . Hypertension   . IRRITABLE BOWEL SYNDROME, HX OF   . Menopausal state 01/2012   FSH = 88.5  . Mitral valve prolapse 12/17/2000   a. dx 1980s, most recent echo did not demonstrate this.  Marland Kitchen MYALGIA   . Normal coronary arteries    a. by cardiac CT 2013.  Marland Kitchen PAT (paroxysmal atrial tachycardia) ([place])   . PE (pulmonary embolism) 02/09/2015   a. Bilateral PEs 01/2015 when d-dimer 0.69, CP lying on left side. Followed by heme-onc - + lupus anticoagulant, mildly depressed protein S. Dr. Marin Olp is not certain if her hypercoagulable studies are significant for a thrombophilic state.  . Premature atrial  contractions   . Pulmonary embolus ([place]) 02/10/2015  . Shortness of breath    Pulmonary eval 05/2002  . Thyroid nodule 2014  . Tremors of nervous system     Past Surgical History:  Procedure Laterality Date  . APPENDECTOMY    . COLONOSCOPY    . LAPAROSCOPIC APPENDECTOMY N/A 03/17/2017   Procedure: APPENDECTOMY LAPAROSCOPIC;  Surgeon: Excell Seltzer, MD;  Location: [facility];  Service: General;  Laterality: N/A;  . Lipoma (R) side  1990   Right back    Current Outpatient Medications  Medication Sig Dispense Refill  . ALPRAZolam (XANAX) 0.25 MG tablet TAKE 1 TO 2 TABLETS BY MOUTH TWICE DAILY AS NEEDED FOR ANXIETY  1  . aspirin EC 325 MG tablet Take 325 mg by mouth daily.    Marland Kitchen buPROPion (WELLBUTRIN SR) 200 MG 12 hr tablet TAKE 1 TABLET BY MOUTH DAILY IN THE MORNING 90 tablet 0  . CARTIA XT 180 MG 24 hr capsule TAKE 1 CAPSULE BY MOUTH DAILY. 90 capsule 1  . DEXILANT 60 MG capsule Take 60 mg by mouth daily.  12  . divalproex (DEPAKOTE ER) 500 MG 24 hr tablet TAKE 2 TABLETS BY MOUTH AT BEDTIME. 180 tablet 1  . fluticasone (FLONASE) 50 MCG/ACT nasal spray Place 2 sprays into the nose daily as needed. For allergies    . Nitrofurantoin Monohyd Macro (MACROBID PO) Take 1 capsule by  mouth. After sex    . NONFORMULARY OR COMPOUNDED ITEM Azelaic acid; metronidazole / ivermectin cream  Bid    . NONFORMULARY OR COMPOUNDED ITEM Walker  #1  ---  Type per pt choice   Dx hx of falling 1 each 0  . Probiotic Product (PROBIOTIC DAILY PO) Take 1 tablet by mouth daily.     . propranolol (INDERAL) 20 MG tablet Take 1 tablet (20 mg total) by mouth 2 (two) times daily. 180 tablet 1  . sertraline (ZOLOFT) 50 MG tablet TAKE 1 TABLET BY MOUTH DAILY. 90 tablet 1  . tretinoin (RETIN-A) 0.025 % cream as needed.   4   No current facility-administered medications for this visit.     Family History  Problem Relation Age of Onset  . Lung cancer Mother   . Hypertension Mother   . Hyperlipidemia Mother   . Cancer  Father        Esophageal cancer  . Hyperlipidemia Father   . Depression Father   . Cancer Brother   . Pulmonary embolism Brother   . Colon cancer Paternal Uncle   . Sarcoidosis Other   . Testicular cancer Other     Review of Systems  Exam:   LMP 05/11/2012   Weight change: @WEIGHTCHANGE @ Height:      Ht Readings from Last 3 Encounters:  06/11/18 5\' 9"  (1.753 m)  03/01/18 5\' 9"  (1.753 m)  01/11/18 5\' 9"  (1.753 m)    General appearance: alert, cooperative and appears stated age Head: Normocephalic, without obvious abnormality, atraumatic Neck: no adenopathy, supple, symmetrical, trachea midline and thyroid {CHL AMB PHY EX THYROID NORM DEFAULT:[phone]::"normal to inspection and palpation"} Lungs: clear to auscultation bilaterally Cardiovascular: regular rate and rhythm Breasts: {Exam; breast:13139::"normal appearance, no masses or tenderness"} Abdomen: soft, non-tender; non distended,  no masses,  no organomegaly Extremities: extremities normal, atraumatic, no cyanosis or edema Skin: Skin color, texture, turgor normal. No rashes or lesions Lymph nodes: Cervical, supraclavicular, and axillary nodes normal. No abnormal inguinal nodes palpated Neurologic: Grossly normal   Pelvic: External genitalia:  no lesions              Urethra:  normal appearing urethra with no masses, tenderness or lesions              Bartholins and Skenes: normal                 Vagina: normal appearing vagina with normal color and discharge, no lesions              Cervix: {CHL AMB PHY EX CERVIX NORM DEFAULT:[phone]::"no lesions"}               Bimanual Exam:  Uterus:  {CHL AMB PHY EX UTERUS NORM DEFAULT:[phone]::"normal size, contour, position, consistency, mobility, non-tender"}              Adnexa: {CHL AMB PHY EX ADNEXA NO MASS DEFAULT:[phone]::"no mass, fullness, tenderness"}               Rectovaginal: Confirms               Anus:  normal sphincter tone, no lesions  Chaperone was  present for exam.  A:  Well Woman with normal exam  P:

## 2018-11-30 ENCOUNTER — Other Ambulatory Visit: Payer: Self-pay

## 2018-12-01 ENCOUNTER — Ambulatory Visit: Payer: 59 | Admitting: Obstetrics and Gynecology

## 2018-12-02 ENCOUNTER — Other Ambulatory Visit: Payer: Self-pay | Admitting: Psychiatry

## 2018-12-02 ENCOUNTER — Other Ambulatory Visit: Payer: Self-pay | Admitting: Family Medicine

## 2018-12-02 DIAGNOSIS — I10 Essential (primary) hypertension: Secondary | ICD-10-CM

## 2018-12-03 ENCOUNTER — Inpatient Hospital Stay: Payer: 59 | Attending: Hematology & Oncology

## 2018-12-03 ENCOUNTER — Other Ambulatory Visit: Payer: Self-pay

## 2018-12-03 ENCOUNTER — Inpatient Hospital Stay (HOSPITAL_BASED_OUTPATIENT_CLINIC_OR_DEPARTMENT_OTHER): Payer: 59 | Admitting: Family

## 2018-12-03 ENCOUNTER — Encounter: Payer: Self-pay | Admitting: Family

## 2018-12-03 VITALS — BP 143/53 | HR 81 | Temp 97.7°F | Resp 17 | Wt 179.4 lb

## 2018-12-03 DIAGNOSIS — D6859 Other primary thrombophilia: Secondary | ICD-10-CM

## 2018-12-03 DIAGNOSIS — I2602 Saddle embolus of pulmonary artery with acute cor pulmonale: Secondary | ICD-10-CM

## 2018-12-03 DIAGNOSIS — R760 Raised antibody titer: Secondary | ICD-10-CM

## 2018-12-03 DIAGNOSIS — I2782 Chronic pulmonary embolism: Secondary | ICD-10-CM

## 2018-12-03 DIAGNOSIS — D6862 Lupus anticoagulant syndrome: Secondary | ICD-10-CM | POA: Insufficient documentation

## 2018-12-03 DIAGNOSIS — Z7901 Long term (current) use of anticoagulants: Secondary | ICD-10-CM | POA: Diagnosis not present

## 2018-12-03 DIAGNOSIS — Z86711 Personal history of pulmonary embolism: Secondary | ICD-10-CM | POA: Insufficient documentation

## 2018-12-03 LAB — CBC WITH DIFFERENTIAL (CANCER CENTER ONLY)
Abs Immature Granulocytes: 0.06 10*3/uL (ref 0.00–0.07)
Basophils Absolute: 0.1 10*3/uL (ref 0.0–0.1)
Basophils Relative: 1 %
Eosinophils Absolute: 0.1 10*3/uL (ref 0.0–0.5)
Eosinophils Relative: 1 %
HCT: 40.9 % (ref 36.0–46.0)
Hemoglobin: 13.4 g/dL (ref 12.0–15.0)
Immature Granulocytes: 1 %
Lymphocytes Relative: 47 %
Lymphs Abs: 3.7 10*3/uL (ref 0.7–4.0)
MCH: 28.9 pg (ref 26.0–34.0)
MCHC: 32.8 g/dL (ref 30.0–36.0)
MCV: 88.1 fL (ref 80.0–100.0)
Monocytes Absolute: 0.5 10*3/uL (ref 0.1–1.0)
Monocytes Relative: 6 %
Neutro Abs: 3.4 10*3/uL (ref 1.7–7.7)
Neutrophils Relative %: 44 %
Platelet Count: 264 10*3/uL (ref 150–400)
RBC: 4.64 MIL/uL (ref 3.87–5.11)
RDW: 14.6 % (ref 11.5–15.5)
WBC Count: 7.7 10*3/uL (ref 4.0–10.5)
nRBC: 0 % (ref 0.0–0.2)

## 2018-12-03 LAB — CMP (CANCER CENTER ONLY)
ALT: 16 U/L (ref 0–44)
AST: 14 U/L — ABNORMAL LOW (ref 15–41)
Albumin: 4.5 g/dL (ref 3.5–5.0)
Alkaline Phosphatase: 55 U/L (ref 38–126)
Anion gap: 10 (ref 5–15)
BUN: 9 mg/dL (ref 6–20)
CO2: 26 mmol/L (ref 22–32)
Calcium: 9.1 mg/dL (ref 8.9–10.3)
Chloride: 107 mmol/L (ref 98–111)
Creatinine: 0.9 mg/dL (ref 0.44–1.00)
GFR, Est AFR Am: >60 mL/min (ref >60)
GFR, Estimated: >60 mL/min (ref >60)
Glucose, Bld: 113 mg/dL — ABNORMAL HIGH (ref 70–99)
Potassium: 3.4 mmol/L — ABNORMAL LOW (ref 3.5–5.1)
Sodium: 143 mmol/L (ref 135–145)
Total Bilirubin: 0.3 mg/dL (ref 0.3–1.2)
Total Protein: 7.1 g/dL (ref 6.5–8.1)

## 2018-12-03 MED FILL — DIVALPROEX SOD ER 500 MG TA: 500 | 90 days supply | Qty: 180 | Fill #0

## 2018-12-03 MED FILL — SERTRALINE HCL 50 MG TABLET: 50 | 90 days supply | Qty: 90 | Fill #0

## 2018-12-03 MED FILL — PROPRANOLOL 20 MG TABLET: 20 | 90 days supply | Qty: 180 | Fill #0

## 2018-12-03 NOTE — Progress Notes (Signed)
Hematology and Oncology Follow Up Visit  Beverly Cole [id number] 1960-09-22 58 y.o. 12/03/2018   Principle Diagnosis:  Bilateral pulmonary embolism Positive lupus anticoagulant Mildly depressed protein S level  Past Therapy:   Xarelto 20 mg by mouth daily-finish 1 year of therapy in December 2017 Xarelto 10 mg by mouth daily-to complete 1 year in December 2018- d/c on 01/21/2017  Current Therapy: EC ASA325mg  po q day - startedon 01/21/2017   Interim History:  Beverly Cole is here today for her annual follow-up. She is doing well from our standpoint but states that she has a lot of anxiety about the election. This has caused her some stress.  She states that she is taking her full dose aspirin daily as prescribed.  No episodes of bleeding, no bruising or petechiae.  No fever, chills, n/v, cough, rash, dizziness, chest pain, palpitations or changes in bowel or bladder habits.  She has SOB with exertion and exacerbated at times due to her mask.  She has intermittent twinges of pain in her right abdomen she states due to a kidney stone that she has not yet passed.  No swelling in her extremities at this time.  She has numbness and tingling in her fingertips that comes and goes. None in the lower extremities.  She is eating well but admits that she needs to hydrate a little more at home.   ECOG Performance Status: 1 - Symptomatic but completely ambulatory  Medications:  Allergies as of 12/03/2018      Reactions   Cariprazine Other (See Comments)   Patient becomes suicidal when taking this medication.  Patient becomes suicidal when taking this medication.    Metoprolol Other (See Comments)   Hallucinations hair falls out Hallucinations hair falls out   Nickel Rash      Medication List       Accurate as of December 03, 2018  2:17 PM. If you have any questions, ask your nurse or doctor.        ALPRAZolam 0.25 MG tablet Commonly known as: XANAX TAKE 1 TO 2 TABLETS BY  MOUTH TWICE DAILY AS NEEDED FOR ANXIETY   aspirin EC 325 MG tablet Take 325 mg by mouth daily.   buPROPion 200 MG 12 hr tablet Commonly known as: WELLBUTRIN SR TAKE 1 TABLET BY MOUTH DAILY IN THE MORNING   Cartia XT 180 MG 24 hr capsule Generic drug: diltiazem TAKE 1 CAPSULE BY MOUTH DAILY.   Dexilant 60 MG capsule Generic drug: dexlansoprazole Take 60 mg by mouth daily.   divalproex 500 MG 24 hr tablet Commonly known as: DEPAKOTE ER TAKE 2 TABLETS BY MOUTH AT BEDTIME.   fluticasone 50 MCG/ACT nasal spray Commonly known as: FLONASE Place 2 sprays into the nose daily as needed. For allergies   MACROBID PO Take 1 capsule by mouth. After sex   NONFORMULARY OR COMPOUNDED ITEM Azelaic acid; metronidazole / ivermectin cream  Bid   NONFORMULARY OR COMPOUNDED ITEM Walker  #1  ---  Type per pt choice   Dx hx of falling   PROBIOTIC DAILY PO Take 1 tablet by mouth daily.   propranolol 20 MG tablet Commonly known as: INDERAL TAKE 1 TABLET BY MOUTH 2 TIMES DAILY.   sertraline 50 MG tablet Commonly known as: ZOLOFT TAKE 1 TABLET BY MOUTH DAILY.   tretinoin 0.025 % cream Commonly known as: RETIN-A as needed.       Allergies:  Allergies  Allergen Reactions  . Cariprazine Other (See Comments)  Patient becomes suicidal when taking this medication.  Patient becomes suicidal when taking this medication.   . Metoprolol Other (See Comments)    Hallucinations hair falls out Hallucinations hair falls out  . Nickel Rash    Past Medical History, Surgical history, Social history, and Family History were reviewed and updated.  Review of Systems: All other 10 point review of systems is negative.   Physical Exam:  weight is 179 lb 6.4 oz (81.4 kg). Her temporal temperature is 97.7 F (36.5 C). Her blood pressure is 143/53 (abnormal) and her pulse is 81. Her respiration is 17 and oxygen saturation is 100%.   Wt Readings from Last 3 Encounters:  12/03/18 179 lb 6.4 oz  (81.4 kg)  06/11/18 175 lb 12.8 oz (79.7 kg)  03/01/18 164 lb (74.4 kg)    Ocular: Sclerae unicteric, pupils equal, round and reactive to light Ear-nose-throat: Oropharynx clear, dentition fair Lymphatic: No cervical or supraclavicular adenopathy Lungs no rales or rhonchi, good excursion bilaterally Heart regular rate and rhythm, no murmur appreciated Abd soft, nontender, positive bowel sounds, no liver or spleen tip palpated on exam, no fluid wave  MSK no focal spinal tenderness, no joint edema Neuro: non-focal, well-oriented, appropriate affect Breasts: Deferred   Lab Results  Component Value Date   WBC 7.7 12/03/2018   HGB 13.4 12/03/2018   HCT 40.9 12/03/2018   MCV 88.1 12/03/2018   PLT 264 12/03/2018   No results found for: FERRITIN, IRON, TIBC, UIBC, IRONPCTSAT Lab Results  Component Value Date   RBC 4.64 12/03/2018   No results found for: KPAFRELGTCHN, LAMBDASER, KAPLAMBRATIO No results found for: Kandis Cocking, IGMSERUM No results found for: Odetta Pink, SPEI   Chemistry      Component Value Date/Time   NA 138 12/03/2017 1408   NA 142 01/21/2017 1013   NA 143 01/16/2016 1005   K 3.3 12/03/2017 1408   K 3.2 (L) 01/21/2017 1013   K 3.3 (L) 01/16/2016 1005   CL 114 (H) 12/03/2017 1408   CL 106 01/21/2017 1013   CO2 23 12/03/2017 1408   CO2 29 01/21/2017 1013   CO2 20 (L) 01/16/2016 1005   BUN 8 12/03/2017 1408   BUN 9 01/21/2017 1013   BUN 12.6 01/16/2016 1005   CREATININE 1.10 12/03/2017 1408   CREATININE 0.9 01/21/2017 1013   CREATININE 0.9 01/16/2016 1005      Component Value Date/Time   CALCIUM 9.1 12/03/2017 1408   CALCIUM 9.2 01/21/2017 1013   CALCIUM 9.5 01/16/2016 1005   ALKPHOS 59 12/03/2017 1408   ALKPHOS 62 01/21/2017 1013   ALKPHOS 81 01/16/2016 1005   AST 26 12/03/2017 1408   AST 16 01/16/2016 1005   ALT 26 12/03/2017 1408   ALT 22 01/21/2017 1013   ALT 24 01/16/2016 1005   BILITOT  0.5 12/03/2017 1408   BILITOT 0.59 01/16/2016 1005       Impression and Plan: Beverly Cole is a very pleasant 58 yo caucasian female with history of bilateral pulmonary embolism while on hormonal replacement therapy. She also had a slightly low protein S level and positive lupus anticoagulant.  She continues to do well on full dose aspirin daily and has not had any recurrence.  We will plan to see her back in another year.  She will contact our office with any questions or concerns. We can certainly see her sooner if needed.   Laverna Peace, NP 10/23/20202:17 PM

## 2018-12-05 LAB — LUPUS ANTICOAGULANT PANEL
DRVVT: 46.7 s (ref 0.0–47.0)
PTT Lupus Anticoagulant: 29.3 s (ref 0.0–51.9)

## 2018-12-05 LAB — PROTEIN S, TOTAL: Protein S Ag, Total: 142 % (ref 60–150)

## 2018-12-05 LAB — PROTEIN S ACTIVITY: Protein S Activity: 152 % — ABNORMAL HIGH (ref 63–140)

## 2018-12-06 MED FILL — CARTIA XT 180 MG CAPSULE SA: 180 | 90 days supply | Qty: 90 | Fill #0

## 2018-12-08 ENCOUNTER — Ambulatory Visit (INDEPENDENT_AMBULATORY_CARE_PROVIDER_SITE_OTHER): Payer: 59 | Admitting: Psychology

## 2018-12-08 DIAGNOSIS — F331 Major depressive disorder, recurrent, moderate: Secondary | ICD-10-CM | POA: Diagnosis not present

## 2018-12-09 ENCOUNTER — Encounter: Payer: Self-pay | Admitting: Psychiatry

## 2018-12-09 ENCOUNTER — Ambulatory Visit (INDEPENDENT_AMBULATORY_CARE_PROVIDER_SITE_OTHER): Payer: 59 | Admitting: Psychiatry

## 2018-12-09 ENCOUNTER — Other Ambulatory Visit: Payer: Self-pay

## 2018-12-09 DIAGNOSIS — F423 Hoarding disorder: Secondary | ICD-10-CM

## 2018-12-09 DIAGNOSIS — F4001 Agoraphobia with panic disorder: Secondary | ICD-10-CM

## 2018-12-09 DIAGNOSIS — F315 Bipolar disorder, current episode depressed, severe, with psychotic features: Secondary | ICD-10-CM | POA: Diagnosis not present

## 2018-12-09 DIAGNOSIS — F40233 Fear of injury: Secondary | ICD-10-CM | POA: Diagnosis not present

## 2018-12-09 DIAGNOSIS — F411 Generalized anxiety disorder: Secondary | ICD-10-CM | POA: Diagnosis not present

## 2018-12-09 DIAGNOSIS — G250 Essential tremor: Secondary | ICD-10-CM | POA: Diagnosis not present

## 2018-12-09 MED ORDER — MODAFINIL 200 MG PO TABS: ORAL_TABLET | ORAL | 1 refills | Status: DC

## 2018-12-09 MED FILL — MODAFINIL 200 MG TABLET: 200 | 30 days supply | Qty: 30 | Fill #0

## 2018-12-09 NOTE — Progress Notes (Signed)
[name] [id number] [birth year] 58 y.o.  Subjective:   Patient ID:  Beverly Cole is a 58 y.o. (DOB 27-Dec-1960) female.  Chief Complaint:  Chief Complaint  Patient presents with  . Follow-up    Medication Management  . Depression    Medication Management  . Anxiety    Medication Management  . Hallucinations    Depression        Associated symptoms include no decreased concentration and no suicidal ideas.  Past medical history includes anxiety.   Anxiety Symptoms include nervous/anxious behavior and shortness of breath. Patient reports no confusion, decreased concentration or suicidal ideas.     Beverly Cole  today for follow-up of chronic depression and anxiety.  Lately depression worse than anxiety.  Seen with husband today  When seen May 11, 2018.  For persistent anxiety we elected to retry buspirone and try increasing it to 30 mg twice daily if tolerated.   Couldn't tolerate it this time DT muscle spasms.  Pharmacy called saying she could have serotonin syndrome.  Last seen August 09, 2018 and she refused med changes despite chronic anxiety and depression. She remained on sertraline 50, Depakote ER 1000 mg, Wellbutrin SR 200 mg AM  Panic wearing cloth masks.  Using a shield.  Attending therapy every 2 weeks.   Still does nothing and in bed watching TV all day.  Poor hygiene, motivation, energy worse.  Not more sad, just like I've always been.  Can't make herself leave the house DT depression and anxiety.  She won't do chores.   H thinks she sleeps a lot.  He works from home 2 days weekly.  No periods of hyperactivity or manic sx since the spring. Had scary hallucination of a bat on buspirone.  H notes also she saw a bird and cat even off the buspirone.  M also had visual hallucinations of man in the house. Can be confused when first wakes up regardless of time of day. Has had fearful thoughts of planes crashing or bad things happening to others and it might be her  fault.  Saw pulmonologist over weakness and he says she's OK except deconditioned.  Can't tolerate wearing the masks.  Was happy when got new cats in the summer.  Anxious and Depressed and describes anxiety as Moderate. Anxiety symptoms include: Excessive Worry,.  She thinks sertraline reduces her obsessiveness.  Past hx panic so avoidant. Never get to relax.  Pt reports sleeps excessively. Pt reports that appetite is good. Pt reports that energy is poor and loss of interest or pleasure in usual activities, poor motivation and withdrawn from usual activities. Just don't care about things and admits to a lot of general anxiety and everything takes a lot of energy out of her.  Compulsively tapes and watches TV shows, news and awards shows.  Can't delete it bc I'll miss something.  Watches TV in bed. Concentration is poor. Suicidal thoughts:  denied by patient. But chronic death thoughts.  Poor productivity overall.  Chronic poor self care, showers only weekly.  Just don't care.  Chronic disability.  She thinks sertraline helped the anxiety some.  Likes that H is at home.  Helps her mood.  Doesn't care about going out.  Feels faint when she tries to wear a mask.  Too anxious driving and is avoidant.  Collects TV shows, she can't erase unless she watches entirely.  Rare use of Xanax bc fears addiction.  Afraid to try new meds after negative  reaction to [employer].  Afraid of any med change.  Afraid of having SI and afraid she'd do it if had SI. Hopeless about getting better.   Past psych med trials: Wellbutrin XL 300,  Sertraline  ECT 2016 without help,  Vraylar SI,  pramipexole,  Seroquel which caused side effects of constipation,  Abilify ,  lamotrigine,  Latuda,  refuses lithium because of altered taste.   She has a history of ataxia on 1500 mg of Depakote.   Recent buspirone SE.  Review of Systems:  Review of Systems  Respiratory: Positive for shortness of breath.   Gastrointestinal:  Positive for abdominal pain.  Neurological: Negative for tremors and weakness.  Psychiatric/Behavioral: Positive for depression, dysphoric mood and sleep disturbance. Negative for agitation, behavioral problems, confusion, decreased concentration, hallucinations, self-injury and suicidal ideas. The patient is nervous/anxious. The patient is not hyperactive.     Medications: I have reviewed the patient's current medications.  Current Outpatient Medications  Medication Sig Dispense Refill  . ALPRAZolam (XANAX) 0.25 MG tablet TAKE 1 TO 2 TABLETS BY MOUTH TWICE DAILY AS NEEDED FOR ANXIETY  1  . aspirin EC 325 MG tablet Take 325 mg by mouth daily.    Marland Kitchen buPROPion (WELLBUTRIN SR) 200 MG 12 hr tablet TAKE 1 TABLET BY MOUTH DAILY IN THE MORNING 90 tablet 0  . CARTIA XT 180 MG 24 hr capsule TAKE 1 CAPSULE BY MOUTH DAILY. 90 capsule 0  . DEXILANT 60 MG capsule Take 60 mg by mouth daily.  12  . divalproex (DEPAKOTE ER) 500 MG 24 hr tablet TAKE 2 TABLETS BY MOUTH AT BEDTIME. 180 tablet 0  . fluticasone (FLONASE) 50 MCG/ACT nasal spray Place 2 sprays into the nose daily as needed. For allergies    . NONFORMULARY OR COMPOUNDED ITEM Azelaic acid; metronidazole / ivermectin cream  Bid    . NONFORMULARY OR COMPOUNDED ITEM Walker  #1  ---  Type per pt choice   Dx hx of falling 1 each 0  . Probiotic Product (PROBIOTIC DAILY PO) Take 1 tablet by mouth daily.     . propranolol (INDERAL) 20 MG tablet TAKE 1 TABLET BY MOUTH 2 TIMES DAILY. 180 tablet 0  . sertraline (ZOLOFT) 50 MG tablet TAKE 1 TABLET BY MOUTH DAILY. 90 tablet 0  . tretinoin (RETIN-A) 0.025 % cream as needed.   4  . modafinil (PROVIGIL) 200 MG tablet 1/2 tablet in the morning for 1 week and then if tolerated increase to 1 tablet each morning 30 tablet 1  . Nitrofurantoin Monohyd Macro (MACROBID PO) Take 1 capsule by mouth. After sex     No current facility-administered medications for this visit.     Medication Side Effects: None  Allergies:   Allergies  Allergen Reactions  . Cariprazine Other (See Comments)    Patient becomes suicidal when taking this medication.  Patient becomes suicidal when taking this medication.   . Metoprolol Other (See Comments)    Hallucinations hair falls out Hallucinations hair falls out  . Nickel Rash    Past Medical History:  Diagnosis Date  . Anxiety   . Atrial fibrillation ([place])    a. Event monitor 2013 - PACs/bradycardia/SVT/short run of atrial fib by event monitor.   Marland Kitchen BIPOLAR DISORDER UNSPECIFIED   . Bradycardia   . Bursitis of hip 09/1999   Bilateral - Dr. Berenice Primas  . DEPRESSION   . Emphysema lung ([place]) 06/20/2016   pt states pulmonalongist stated started recently  . GERD   .  Headache(784.0)    was with menstrual cycle. no longer a problem  . HYPERLIPIDEMIA   . Hypertension   . Hypertension   . IRRITABLE BOWEL SYNDROME, HX OF   . Menopausal state 01/2012   FSH = 88.5  . Mitral valve prolapse 12/17/2000   a. dx 1980s, most recent echo did not demonstrate this.  Marland Kitchen MYALGIA   . Normal coronary arteries    a. by cardiac CT 2013.  Marland Kitchen PAT (paroxysmal atrial tachycardia) ([place])   . PE (pulmonary embolism) 02/09/2015   a. Bilateral PEs 01/2015 when d-dimer 0.69, CP lying on left side. Followed by heme-onc - + lupus anticoagulant, mildly depressed protein S. Dr. Marin Olp is not certain if her hypercoagulable studies are significant for a thrombophilic state.  . Premature atrial contractions   . Pulmonary embolus ([place]) 02/10/2015  . Shortness of breath    Pulmonary eval 05/2002  . Thyroid nodule 2014  . Tremors of nervous system     Family History  Problem Relation Age of Onset  . Lung cancer Mother   . Hypertension Mother   . Hyperlipidemia Mother   . Cancer Father        Esophageal cancer  . Hyperlipidemia Father   . Depression Father   . Cancer Brother   . Pulmonary embolism Brother   . Colon cancer Paternal Uncle   . Sarcoidosis Other   . Testicular cancer Other      Social History   Socioeconomic History  . Marital status: Married    Spouse name: Not on file  . Number of children: 0  . Years of education: Not on file  . Highest education level: Not on file  Occupational History  . Occupation: Nurse-Personal Care/HH    Employer: UNEMPLOYED  Social Needs  . Financial resource strain: Not on file  . Food insecurity    Worry: Not on file    Inability: Not on file  . Transportation needs    Medical: Not on file    Non-medical: Not on file  Tobacco Use  . Smoking status: Former Smoker    Packs/day: 1.00    Years: 25.00    Pack years: 25.00    Types: Cigarettes    Quit date: 02/11/1996    Years since quitting: 22.8  . Smokeless tobacco: Never Used  . Tobacco comment: Married, lives with spouse. 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 Alcohol use: Yes    Comment: occ  . Drug use: No  . Sexual activity: Yes    Partners: Male    Birth control/protection: Post-menopausal  Lifestyle  . Physical activity    Days per week: Not on file    Minutes per session: Not on file  . Stress: Not on file  Relationships  . Social Herbalist on phone: Not on file    Gets together: Not on file    Attends religious service: Not on file    Active member of club or organization: Not on file    Attends meetings of clubs or organizations: Not on file    Relationship status: Not on file  . Intimate partner violence    Fear of current or ex partner: Not on file    Emotionally abused: Not on file    Physically abused: Not on file    Forced sexual activity: Not on file  Other Topics Concern  . Not on file  Social History Narrative  Married, lives with spouse in [place]. 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   Please see  review of systems for further details on the patient's review from today.   Objective:   Physical Exam:  LMP 05/11/2012   Physical Exam Neurological:     Mental Status: She is alert and oriented to person, place, and time.     Cranial Nerves: No dysarthria.  Psychiatric:        Attention and Perception: Attention normal. She is attentive. She does not perceive auditory or visual hallucinations.        Mood and Affect: Mood is anxious and depressed.        Speech: Speech normal.        Behavior: Behavior is cooperative.        Thought Content: Thought content normal. Thought content is not paranoid or delusional. Thought content does not include homicidal or suicidal ideation. Thought content does not include homicidal or suicidal plan.        Cognition and Memory: Cognition and memory normal.     Comments: Insight is fair at best.  She tends to contradict herself at times about her symptoms.  Judgment fair     Lab Review:     Component Value Date/Time   NA 143 12/03/2018 1346   NA 142 01/21/2017 1013   NA 143 01/16/2016 1005   K 3.4 (L) 12/03/2018 1346   K 3.2 (L) 01/21/2017 1013   K 3.3 (L) 01/16/2016 1005   CL 107 12/03/2018 1346   CL 106 01/21/2017 1013   CO2 26 12/03/2018 1346   CO2 29 01/21/2017 1013   CO2 20 (L) 01/16/2016 1005   GLUCOSE 113 (H) 12/03/2018 1346   GLUCOSE 95 01/21/2017 1013   BUN 9 12/03/2018 1346   BUN 9 01/21/2017 1013   BUN 12.6 01/16/2016 1005   CREATININE 0.90 12/03/2018 1346   CREATININE 0.9 01/21/2017 1013   CREATININE 0.9 01/16/2016 1005   CALCIUM 9.1 12/03/2018 1346   CALCIUM 9.2 01/21/2017 1013   CALCIUM 9.5 01/16/2016 1005   PROT 7.1 12/03/2018 1346   PROT 6.9 01/21/2017 1013   PROT 7.1 01/16/2016 1005   ALBUMIN 4.5 12/03/2018 1346   ALBUMIN 3.3 01/21/2017 1013   ALBUMIN 3.6 01/16/2016 1005   AST 14 (L) 12/03/2018 1346   AST 16 01/16/2016 1005   ALT 16 12/03/2018 1346   ALT 22 01/21/2017 1013   ALT 24 01/16/2016 1005   ALKPHOS  55 12/03/2018 1346   ALKPHOS 62 01/21/2017 1013   ALKPHOS 81 01/16/2016 1005   BILITOT 0.3 12/03/2018 1346   BILITOT 0.59 01/16/2016 1005   GFRNONAA >60 12/03/2018 1346   GFRAA >60 12/03/2018 1346       Component Value Date/Time   WBC 7.7 12/03/2018 1346   WBC 8.3 11/26/2017 1451   RBC 4.64 12/03/2018 1346   HGB 13.4 12/03/2018 1346   HGB 12.8 01/21/2017 1013   HCT 40.9 12/03/2018 1346   HCT 38.2 01/21/2017 1013   PLT 264 12/03/2018 1346   PLT 176 01/21/2017 1013   MCV 88.1 12/03/2018 1346   MCV 90 01/21/2017 1013   MCH 28.9 12/03/2018 1346   MCHC 32.8 12/03/2018 1346   RDW 14.6 12/03/2018 1346   RDW 14.5 01/21/2017 1013  LYMPHSABS 3.7 12/03/2018 1346   LYMPHSABS 3.6 (H) 01/21/2017 1013   MONOABS 0.5 12/03/2018 1346   EOSABS 0.1 12/03/2018 1346   EOSABS 0.5 01/21/2017 1013   BASOSABS 0.1 12/03/2018 1346   BASOSABS 0.0 01/21/2017 1013    No results found for: POCLITH, LITHIUM   Lab Results  Component Value Date   PHENYTOIN <0.5 ug/mL (L) 11/05/2006   VALPROATE 73.7 11/25/2016     .res Assessment: Plan:    Anike was seen today for follow-up, depression, anxiety and hallucinations.  Diagnoses and all orders for this visit:  Bipolar I disorder, most recent episode (or current) depressed, severe, specified as with psychotic behavior ([place]) -     modafinil (PROVIGIL) 200 MG tablet; 1/2 tablet in the morning for 1 week and then if tolerated increase to 1 tablet each morning  Generalized anxiety disorder  Panic disorder with agoraphobia  Benign essential tremor  Hoarding behavior  Fear of injury   Poor sleep hygiene.  Lifelong history of chronic anhedonic depression and anxiety with very poor functioning continues.  Prognosis is guarded bc multiple med failures and her resistance to change and chronicity of sx and low motivation for change.  She gets some benefit from the current medications.  She is not currently manic.  She is tolerating the medications.   She is had multiple medication failures as noted above.  She is fearful of trying new medications because of a history of suicidal thoughts on Vraylar.  Her husband is concerned because of her very poor functioning and therefore she is more willing to consider med changes.  Chronic severe depression and poor function and negative thinking.  Consider olanzapine/fluoxetine combo but she fear of weight gain.  Consider off label Caplyta.  .there are other atypical antipsychotic mood stabilizers that could be considered but she refuses.  Has gained 15# since Covid.  New cats enjoys them.. Consider off label stimulant but could worsen mania and hallucinations.  Modafinil would be likely safer than traditional stimulants.  Discussed side effects of each option.  She prefers trial of modafinil 200 mg 1/2 each am for 1 week then 200 mg each AM.  Disc use of the clear mask with the headband instead of the mask.  Claims she gets short of breath when she tries to use a regular mask.  She denies his claustrophobia but it does actually appear to be some degree of claustrophobia.  Increase activity.  Work on sleep schedule and try to regulate it for overall mental health benefits.  Continue other meds.  If modafinil is helpful then consider eliminating Wellbutrin.  Still seeing therapist Arville Lime.  This appt was 30 mins.  FU 3 mo  Lynder Parents, MD, DFAPA   Please see After Visit Summary for patient specific instructions.  Future Appointments  Date Time Provider [place]  12/21/2018  2:00 PM Yahira, [place], [place] [facility]  12/22/2018  3:00 PM Bauert, Nicolasa Ducking, LCSW [facility] None  12/23/2018  3:20 PM [facility] MM 2 [facility]  01/10/2019  4:30 PM Cottle, Billey Co., MD [facility] None  01/12/2019  3:00 PM Bauert, Nicolasa Ducking, LCSW [facility] None  01/17/2019  3:30 PM Salvadore Dom, MD [place] None  12/02/2019  2:30 PM [facility] None  12/02/2019  3:00 PM Ennever, Rudell Cobb,  MD [facility] None    No orders of the defined types were placed in this encounter.     -------------------------------

## 2018-12-20 MED FILL — DEXILANT DR 60 MG CAPSULE: 60 | 30 days supply | Qty: 30 | Fill #3

## 2018-12-21 ENCOUNTER — Encounter: Payer: 59 | Admitting: Family Medicine

## 2018-12-22 ENCOUNTER — Ambulatory Visit (INDEPENDENT_AMBULATORY_CARE_PROVIDER_SITE_OTHER): Payer: 59 | Admitting: Psychology

## 2018-12-22 DIAGNOSIS — F331 Major depressive disorder, recurrent, moderate: Secondary | ICD-10-CM

## 2018-12-23 ENCOUNTER — Ambulatory Visit: Payer: 59

## 2018-12-29 ENCOUNTER — Encounter: Payer: Self-pay | Admitting: Family Medicine

## 2018-12-30 ENCOUNTER — Encounter: Payer: Self-pay | Admitting: Family Medicine

## 2018-12-30 MED FILL — TRIMETHOPRIM 100 MG TABLET: 100 | 30 days supply | Qty: 30 | Fill #1

## 2018-12-31 ENCOUNTER — Encounter: Payer: 59 | Admitting: Family Medicine

## 2019-01-10 ENCOUNTER — Ambulatory Visit (INDEPENDENT_AMBULATORY_CARE_PROVIDER_SITE_OTHER): Payer: 59 | Admitting: Psychiatry

## 2019-01-10 ENCOUNTER — Encounter: Payer: Self-pay | Admitting: Psychiatry

## 2019-01-10 DIAGNOSIS — F40233 Fear of injury: Secondary | ICD-10-CM | POA: Diagnosis not present

## 2019-01-10 DIAGNOSIS — F315 Bipolar disorder, current episode depressed, severe, with psychotic features: Secondary | ICD-10-CM | POA: Diagnosis not present

## 2019-01-10 DIAGNOSIS — F40240 Claustrophobia: Secondary | ICD-10-CM

## 2019-01-10 DIAGNOSIS — F4001 Agoraphobia with panic disorder: Secondary | ICD-10-CM | POA: Diagnosis not present

## 2019-01-10 DIAGNOSIS — F411 Generalized anxiety disorder: Secondary | ICD-10-CM

## 2019-01-10 DIAGNOSIS — F423 Hoarding disorder: Secondary | ICD-10-CM

## 2019-01-10 DIAGNOSIS — G250 Essential tremor: Secondary | ICD-10-CM | POA: Diagnosis not present

## 2019-01-10 NOTE — Progress Notes (Signed)
Beverly Cole [id number] 04/26/1960 58 y.o.  Virtual Visit via Telephone Note  I connected with pt by telephone and verified that I am speaking with the correct person using two identifiers.   I discussed the limitations, risks, security and privacy concerns of performing an evaluation and management service by telephone and the availability of in person appointments. I also discussed with the patient that there may be a patient responsible charge related to this service. The patient expressed understanding and agreed to proceed.  I discussed the assessment and treatment plan with the patient. The patient was provided an opportunity to ask questions and all were answered. The patient agreed with the plan and demonstrated an understanding of the instructions.   The patient was advised to call back or seek an in-person evaluation if the symptoms worsen or if the condition fails to improve as anticipated.  I provided 15 minutes of non-face-to-face time during this encounter. The call started at 5 and ended at 550. The patient was located at home and the provider was located office.  Subjective:   Patient ID:  Beverly Cole is a 58 y.o. (DOB 1961-02-10) female.  Chief Complaint:  Chief Complaint  Patient presents with  . Follow-up    Medication Management  . Other    Medication Management  . Depression    Medication Management  . Fatigue    Depression        Associated symptoms include no decreased concentration and no suicidal ideas.  Past medical history includes anxiety.   Anxiety Symptoms include dizziness, nervous/anxious behavior and shortness of breath. Patient reports no confusion, decreased concentration or suicidal ideas.     [name]  today for follow-up of chronic depression and anxiety.  Lately depression worse than anxiety.  Seen with husband today  When seen May 11, 2018.  For persistent anxiety we elected to retry buspirone and try increasing it to 30 mg  twice daily if tolerated.   Couldn't tolerate it this time DT muscle spasms.  Pharmacy called saying she could have serotonin syndrome.  When seen August 09, 2018 and she refused med changes despite chronic anxiety and depression. She remained on sertraline 50, Depakote ER 1000 mg, Wellbutrin SR 200 mg AM  Panic wearing cloth masks.  Using a shield.  Not in the office today bc H exposed to Covid.    Last seen December 09, 2018.  Because of chronic depression and dysfunction with fatigue and poor motivation we decided off label trial  trial of modafinil 200 mg 1/2 each am for 1 week then 200 mg each AM.  Did see benefit from it.  More energy and working to stay out of bed more.  Trying to set her alarm clock.  No hallucinations.  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  Plans to send more too.  Still follow through is not great.  Still depression and productivity is still poor but it is not as poor as it was before modafinil.  Asks about melatonin HS.  Dizzy in the morning when gets up.    Attending therapy every 2 weeks.    Not more sad, just like I've always been.  Can't make herself leave the house DT depression and anxiety.  She won't do chores.   Less excess sleeping.  He works from home 2 days weekly.  No periods of hyperactivity or manic sx since the  spring. Had scary hallucination of a bat on buspirone.  H notes also she saw a bird and cat even off the buspirone.  M also had visual hallucinations of man in the house. Can be confused when first wakes up regardless of time of day. Has had fearful thoughts of planes crashing or bad things happening to others and it might be her fault.  Saw pulmonologist over weakness and he says she's OK except deconditioned.  Can't tolerate wearing the masks.  Was happy when got new cats in the summer.  Anxious and Depressed and describes anxiety as Moderate. Anxiety symptoms include:  Excessive Worry,.  She thinks sertraline reduces her obsessiveness.  Past hx panic so avoidant. Never get to relax.  Pt reports sleeps excessively. Pt reports that appetite is good. Pt reports that energy is poor and loss of interest or pleasure in usual activities, poor motivation and withdrawn from usual activities. Just don't care about things and admits to a lot of general anxiety and everything takes a lot of energy out of her.  Compulsively tapes and watches TV shows, news and awards shows.  Can't delete it bc I'll miss something.  Watches TV in bed. Concentration is poor. Suicidal thoughts:  denied by patient. But chronic death thoughts.  Poor productivity overall.  Chronic poor self care, showers only weekly.  Just don't care.  Chronic disability.  She thinks sertraline helped the anxiety some.  Likes that H is at home.  Helps her mood.  Doesn't care about going out.  Feels faint when she tries to wear a mask.  Too anxious driving and is avoidant.  Collects TV shows, she can't erase unless she watches entirely.  Rare use of Xanax bc fears addiction.  Afraid to try new meds after negative reaction to Vraylar.  Afraid of any med change.  Afraid of having SI and afraid she'd do it if had SI. Hopeless about getting better.   Past psych med trials: Wellbutrin XL 300,  Sertraline  ECT 2016 without help,  Vraylar SI,  pramipexole,  Seroquel which caused side effects of constipation,  Abilify ,  lamotrigine,  Latuda,  refuses lithium because of altered taste.   She has a history of ataxia on 1500 mg of Depakote.   Recent buspirone SE.  Review of Systems:  Review of Systems  Respiratory: Positive for shortness of breath.   Gastrointestinal: Positive for abdominal pain.  Neurological: Positive for dizziness. Negative for tremors and weakness.  Psychiatric/Behavioral: Positive for depression, dysphoric mood and sleep disturbance. Negative for agitation, behavioral problems, confusion,  decreased concentration, hallucinations, self-injury and suicidal ideas. The patient is nervous/anxious. The patient is not hyperactive.     Medications: I have reviewed the patient's current medications.  Current Outpatient Medications  Medication Sig Dispense Refill  . ALPRAZolam (XANAX) 0.25 MG tablet TAKE 1 TO 2 TABLETS BY MOUTH TWICE DAILY AS NEEDED FOR ANXIETY  1  . aspirin EC 325 MG tablet Take 325 mg by mouth daily.    Marland Kitchen buPROPion (WELLBUTRIN SR) 200 MG 12 hr tablet TAKE 1 TABLET BY MOUTH DAILY IN THE MORNING 90 tablet 0  . CARTIA XT 180 MG 24 hr capsule TAKE 1 CAPSULE BY MOUTH DAILY. 90 capsule 0  . DEXILANT 60 MG capsule Take 60 mg by mouth daily.  12  . divalproex (DEPAKOTE ER) 500 MG 24 hr tablet TAKE 2 TABLETS BY MOUTH AT BEDTIME. 180 tablet 0  . fluticasone (FLONASE) 50 MCG/ACT nasal spray Place 2  sprays into the nose daily as needed. For allergies    . modafinil (PROVIGIL) 200 MG tablet 1/2 tablet in the morning for 1 week and then if tolerated increase to 1 tablet each morning 30 tablet 1  . NONFORMULARY OR COMPOUNDED ITEM Azelaic acid; metronidazole / ivermectin cream  Bid    . NONFORMULARY OR COMPOUNDED ITEM Walker  #1  ---  Type per pt choice   Dx hx of falling 1 each 0  . Probiotic Product (PROBIOTIC DAILY PO) Take 1 tablet by mouth daily.     . propranolol (INDERAL) 20 MG tablet TAKE 1 TABLET BY MOUTH 2 TIMES DAILY. 180 tablet 0  . sertraline (ZOLOFT) 50 MG tablet TAKE 1 TABLET BY MOUTH DAILY. 90 tablet 0  . tretinoin (RETIN-A) 0.025 % cream as needed.   4  . Nitrofurantoin Monohyd Macro (MACROBID PO) Take 1 capsule by mouth. After sex     No current facility-administered medications for this visit.     Medication Side Effects: None  Allergies:  Allergies  Allergen Reactions  . Cariprazine Other (See Comments)    Patient becomes suicidal when taking this medication.  Patient becomes suicidal when taking this medication.   . Metoprolol Other (See Comments)     Hallucinations hair falls out Hallucinations hair falls out  . Nickel Rash    Past Medical History:  Diagnosis Date  . Anxiety   . Atrial fibrillation ([place])    a. Event monitor 2013 - PACs/bradycardia/SVT/short run of atrial fib by event monitor.   Marland Kitchen BIPOLAR DISORDER UNSPECIFIED   . Bradycardia   . Bursitis of hip 09/1999   Bilateral - Dr. Berenice Primas  . DEPRESSION   . Emphysema lung ([place]) 06/20/2016   pt states pulmonalongist stated started recently  . GERD   . Headache(784.0)    was with menstrual cycle. no longer a problem  . HYPERLIPIDEMIA   . Hypertension   . Hypertension   . IRRITABLE BOWEL SYNDROME, HX OF   . Menopausal state 01/2012   FSH = 88.5  . Mitral valve prolapse 12/17/2000   a. dx 1980s, most recent echo did not demonstrate this.  Marland Kitchen MYALGIA   . Normal coronary arteries    a. by cardiac CT 2013.  Marland Kitchen PAT (paroxysmal atrial tachycardia) ([place])   . PE (pulmonary embolism) 02/09/2015   a. Bilateral PEs 01/2015 when d-dimer 0.69, CP lying on left side. Followed by heme-onc - + lupus anticoagulant, mildly depressed protein S. Dr. Marin Olp is not certain if her hypercoagulable studies are significant for a thrombophilic state.  . Premature atrial contractions   . Pulmonary embolus ([place]) 02/10/2015  . Shortness of breath    Pulmonary eval 05/2002  . Thyroid nodule 2014  . Tremors of nervous system     Family History  Problem Relation Age of Onset  . Lung cancer Mother   . Hypertension Mother   . Hyperlipidemia Mother   . Cancer Father        Esophageal cancer  . Hyperlipidemia Father   . Depression Father   . Cancer Brother   . Pulmonary embolism Brother   . Colon cancer Paternal Uncle   . Sarcoidosis Other   . Testicular cancer Other     Social History   Socioeconomic History  . Marital status: Married    Spouse name: Not on file  . Number of children: 0  . Years of education: Not on file  . Highest education level: Not on file  Occupational History  .  Occupation: Nurse-Personal Care/HH    Employer: UNEMPLOYED  Social Needs  . Financial resource strain: Not on file  . Food insecurity    Worry: Not on file    Inability: Not on file  . Transportation needs    Medical: Not on file    Non-medical: Not on file  Tobacco Use  . Smoking status: Former Smoker    Packs/day: 1.00    Years: 25.00    Pack years: 25.00    Types: Cigarettes    Quit date: 02/11/1996    Years since quitting: 22.9  . Smokeless tobacco: Never Used  . Tobacco comment: Married, lives with spouse. 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 Alcohol use: Yes    Comment: occ  . Drug use: No  . Sexual activity: Yes    Partners: Male    Birth control/protection: Post-menopausal  Lifestyle  . Physical activity    Days per week: Not on file    Minutes per session: Not on file  . Stress: Not on file  Relationships  . Social Herbalist on phone: Not on file    Gets together: Not on file    Attends religious service: Not on file    Active member of club or organization: Not on file    Attends meetings of clubs or organizations: Not on file    Relationship status: Not on file  . Intimate partner violence    Fear of current or ex partner: Not on file    Emotionally abused: Not on file    Physically abused: Not on file    Forced sexual activity: Not on file  Other Topics Concern  . Not on file  Social History Narrative   Married, lives with spouse in [place]. 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   Please see review of systems for further details on the patient's review from today.   Objective:   Physical Exam:  LMP 05/11/2012   Physical Exam Neurological:     Mental Status: She is alert and oriented to person, place, and time.      Cranial Nerves: No dysarthria.  Psychiatric:        Attention and Perception: Attention normal. She is attentive. She does not perceive auditory or visual hallucinations.        Mood and Affect: Mood is anxious and depressed.        Speech: Speech normal.        Behavior: Behavior is cooperative.        Thought Content: Thought content normal. Thought content is not paranoid or delusional. Thought content does not include homicidal or suicidal ideation. Thought content does not include homicidal or suicidal plan.        Cognition and Memory: Cognition and memory normal.     Comments: Insight is fair at best.  She tends to contradict herself at times about her symptoms.  Judgment fair     Lab Review:     Component Value Date/Time   NA 143 12/03/2018 1346   NA 142 01/21/2017 1013   NA 143 01/16/2016 1005   K 3.4 (L) 12/03/2018 1346   K 3.2 (L) 01/21/2017 1013  K 3.3 (L) 01/16/2016 1005   CL 107 12/03/2018 1346   CL 106 01/21/2017 1013   CO2 26 12/03/2018 1346   CO2 29 01/21/2017 1013   CO2 20 (L) 01/16/2016 1005   GLUCOSE 113 (H) 12/03/2018 1346   GLUCOSE 95 01/21/2017 1013   BUN 9 12/03/2018 1346   BUN 9 01/21/2017 1013   BUN 12.6 01/16/2016 1005   CREATININE 0.90 12/03/2018 1346   CREATININE 0.9 01/21/2017 1013   CREATININE 0.9 01/16/2016 1005   CALCIUM 9.1 12/03/2018 1346   CALCIUM 9.2 01/21/2017 1013   CALCIUM 9.5 01/16/2016 1005   PROT 7.1 12/03/2018 1346   PROT 6.9 01/21/2017 1013   PROT 7.1 01/16/2016 1005   ALBUMIN 4.5 12/03/2018 1346   ALBUMIN 3.3 01/21/2017 1013   ALBUMIN 3.6 01/16/2016 1005   AST 14 (L) 12/03/2018 1346   AST 16 01/16/2016 1005   ALT 16 12/03/2018 1346   ALT 22 01/21/2017 1013   ALT 24 01/16/2016 1005   ALKPHOS 55 12/03/2018 1346   ALKPHOS 62 01/21/2017 1013   ALKPHOS 81 01/16/2016 1005   BILITOT 0.3 12/03/2018 1346   BILITOT 0.59 01/16/2016 1005   GFRNONAA >60 12/03/2018 1346   GFRAA >60 12/03/2018 1346       Component Value  Date/Time   WBC 7.7 12/03/2018 1346   WBC 8.3 11/26/2017 1451   RBC 4.64 12/03/2018 1346   HGB 13.4 12/03/2018 1346   HGB 12.8 01/21/2017 1013   HCT 40.9 12/03/2018 1346   HCT 38.2 01/21/2017 1013   PLT 264 12/03/2018 1346   PLT 176 01/21/2017 1013   MCV 88.1 12/03/2018 1346   MCV 90 01/21/2017 1013   MCH 28.9 12/03/2018 1346   MCHC 32.8 12/03/2018 1346   RDW 14.6 12/03/2018 1346   RDW 14.5 01/21/2017 1013   LYMPHSABS 3.7 12/03/2018 1346   LYMPHSABS 3.6 (H) 01/21/2017 1013   MONOABS 0.5 12/03/2018 1346   EOSABS 0.1 12/03/2018 1346   EOSABS 0.5 01/21/2017 1013   BASOSABS 0.1 12/03/2018 1346   BASOSABS 0.0 01/21/2017 1013    No results found for: POCLITH, LITHIUM   Lab Results  Component Value Date   PHENYTOIN <0.5 ug/mL (L) 11/05/2006   VALPROATE 73.7 11/25/2016     .res Assessment: Plan:    Beverly Cole was seen today for follow-up, other, depression and fatigue.  Diagnoses and all orders for this visit:  Bipolar I disorder, most recent episode (or current) depressed, severe, specified as with psychotic behavior ([place])  Generalized anxiety disorder  Panic disorder with agoraphobia  Benign essential tremor  Hoarding behavior  Fear of injury  Claustrophobia   Poor sleep hygiene.  Improved with modafinil.  Lifelong history of chronic anhedonic depression and anxiety with very poor functioning continues.  Prognosis is guarded bc multiple med failures and her resistance to change and chronicity of sx and low motivation for change.  She gets some benefit from the current medications a little bit more active and interested and motivated with the addition of modafinil without side effects.  She still is highly dysfunctional..  She is not currently manic.  She is tolerating the medications.  She is had multiple medication failures as noted above.  She is fearful of trying new medications because of a history of suicidal thoughts on Vraylar.  Her husband is concerned because  of her very poor functioning and therefore she is more willing to consider med changes.  Chronic severe depression and poor function and negative thinking.  Consider olanzapine/fluoxetine combo but she fear of weight gain.  Consider off label Caplyta.  .there are other atypical antipsychotic mood stabilizers that could be considered but she refuses.  Has gained 15# since Covid.  New cats enjoys them.. Modafinil would be likely safer than traditional stimulants.  Discussed side effects.  She has had none.  Continue modafinil 200 mg 1 of the 200 mg each AM.  It has been helpful.  Push fluids bc so much  Time in bed and past history of dehydration.  Disc use of the clear mask with the headband instead of the mask.  Claims she gets short of breath when she tries to use a regular mask.  She denies his claustrophobia but it does actually appear to be some degree of claustrophobia.  Increase activity.  Work on sleep schedule and try to regulate it for overall mental health benefits.  Keep working on improving activity.  Doesn't drive much.  Continue other meds.  If modafinil is helpful then consider eliminating Wellbutrin.  Still seeing therapist Arville Lime.  This appt was 30 mins.  FU 3 mo  Lynder Parents, MD, DFAPA   Please see After Visit Summary for patient specific instructions.  Future Appointments  Date Time Provider [place]  01/12/2019  3:00 PM Barrie Folk, [place] [facility] None  01/17/2019  3:30 PM Salvadore Dom, MD [place] None  01/26/2019  3:00 PM Bauert, Nicolasa Ducking, LCSW [facility] None  12/02/2019  2:30 PM [facility] None  12/02/2019  3:00 PM Ennever, Rudell Cobb, MD [facility] None    No orders of the defined types were placed in this encounter.     -------------------------------

## 2019-01-12 ENCOUNTER — Ambulatory Visit (INDEPENDENT_AMBULATORY_CARE_PROVIDER_SITE_OTHER): Payer: 59 | Admitting: Psychology

## 2019-01-12 DIAGNOSIS — F331 Major depressive disorder, recurrent, moderate: Secondary | ICD-10-CM | POA: Diagnosis not present

## 2019-01-13 ENCOUNTER — Other Ambulatory Visit: Payer: Self-pay

## 2019-01-17 ENCOUNTER — Encounter: Payer: Self-pay | Admitting: Obstetrics and Gynecology

## 2019-01-17 ENCOUNTER — Other Ambulatory Visit: Payer: Self-pay

## 2019-01-17 ENCOUNTER — Ambulatory Visit (INDEPENDENT_AMBULATORY_CARE_PROVIDER_SITE_OTHER): Payer: 59 | Admitting: Obstetrics and Gynecology

## 2019-01-17 VITALS — BP 106/60 | HR 72 | Temp 97.0°F | Ht 69.0 in | Wt 179.8 lb

## 2019-01-17 DIAGNOSIS — Z124 Encounter for screening for malignant neoplasm of cervix: Secondary | ICD-10-CM

## 2019-01-17 DIAGNOSIS — N941 Unspecified dyspareunia: Secondary | ICD-10-CM

## 2019-01-17 DIAGNOSIS — N952 Postmenopausal atrophic vaginitis: Secondary | ICD-10-CM

## 2019-01-17 DIAGNOSIS — Z01419 Encounter for gynecological examination (general) (routine) without abnormal findings: Secondary | ICD-10-CM | POA: Diagnosis not present

## 2019-01-17 MED ORDER — LIDOCAINE 5 % EX OINT: TOPICAL_OINTMENT | CUTANEOUS | 1 refills | Status: DC

## 2019-01-17 MED ORDER — TRIMETHOPRIM 100 MG PO TABS: ORAL_TABLET | ORAL | 1 refills | Status: DC

## 2019-01-17 MED FILL — LIDOCAINE 5 % OINT: 5 | 25 days supply | Qty: 1063 | Fill #0

## 2019-01-17 NOTE — Patient Instructions (Addendum)
You can buy Vaginal Dilators on line, this can help the vagina stretch to make intercourse more comfortable.    EXERCISE AND DIET:  We recommended that you start or continue a regular exercise program for good health. Regular exercise means any activity that makes your heart beat faster and makes you sweat.  We recommend exercising at least 30 minutes per day at least 3 days a week, preferably 4 or 5.  We also recommend a diet low in fat and sugar.  Inactivity, poor dietary choices and obesity can cause diabetes, heart attack, stroke, and kidney damage, among others.    ALCOHOL AND SMOKING:  Women should limit their alcohol intake to no more than 7 drinks/beers/glasses of wine (combined, not each!) per week. Moderation of alcohol intake to this level decreases your risk of breast cancer and liver damage. And of course, no recreational drugs are part of a healthy lifestyle.  And absolutely no smoking or even second hand smoke. Most people know smoking can cause heart and lung diseases, but did you know it also contributes to weakening of your bones? Aging of your skin?  Yellowing of your teeth and nails?  CALCIUM AND VITAMIN D:  Adequate intake of calcium and Vitamin D are recommended.  The recommendations for exact amounts of these supplements seem to change often, but generally speaking 1,200 mg of calcium (between diet and supplement) and 800 units of Vitamin D per day seems prudent. Certain women may benefit from higher intake of Vitamin D.  If you are among these women, your doctor will have told you during your visit.    PAP SMEARS:  Pap smears, to check for cervical cancer or precancers,  have traditionally been done yearly, although recent scientific advances have shown that most women can have pap smears less often.  However, every woman still should have a physical exam from her gynecologist every year. It will include a breast check, inspection of the vulva and vagina to check for abnormal growths  or skin changes, a visual exam of the cervix, and then an exam to evaluate the size and shape of the uterus and ovaries.  And after 59 years of age, a rectal exam is indicated to check for rectal cancers. We will also provide age appropriate advice regarding health maintenance, like when you should have certain vaccines, screening for sexually transmitted diseases, bone density testing, colonoscopy, mammograms, etc.   MAMMOGRAMS:  All women over 90 years old should have a yearly mammogram. Many facilities now offer a "3D" mammogram, which may cost around $50 extra out of pocket. If possible,  we recommend you accept the option to have the 3D mammogram performed.  It both reduces the number of women who will be called back for extra views which then turn out to be normal, and it is better than the routine mammogram at detecting truly abnormal areas.    COLON CANCER SCREENING: Now recommend starting at age 59. At this time colonoscopy is not covered for routine screening until 50. There are take home tests that can be done between 45-49.   COLONOSCOPY:  Colonoscopy to screen for colon cancer is recommended for all women at age 39.  We know, you hate the idea of the prep.  We agree, BUT, having colon cancer and not knowing it is worse!!  Colon cancer so often starts as a polyp that can be seen and removed at colonscopy, which can quite literally save your life!  And if your first  colonoscopy is normal and you have no family history of colon cancer, most women don't have to have it again for 10 years.  Once every ten years, you can do something that may end up saving your life, right?  We will be happy to help you get it scheduled when you are ready.  Be sure to check your insurance coverage so you understand how much it will cost.  It may be covered as a preventative service at no cost, but you should check your particular policy.      Breast Self-Awareness Breast self-awareness means being familiar with how  your breasts look and feel. It involves checking your breasts regularly and reporting any changes to your health care provider. Practicing breast self-awareness is important. A change in your breasts can be a sign of a serious medical problem. Being familiar with how your breasts look and feel allows you to find any problems early, when treatment is more likely to be successful. All women should practice breast self-awareness, including women who have had breast implants. How to do a breast self-exam One way to learn what is normal for your breasts and whether your breasts are changing is to do a breast self-exam. To do a breast self-exam: Look for Changes  1. Remove all the clothing above your waist. 2. Stand in front of a mirror in a room with good lighting. 3. Put your hands on your hips. 4. Push your hands firmly downward. 5. Compare your breasts in the mirror. Look for differences between them (asymmetry), such as: ? Differences in shape. ? Differences in size. ? Puckers, dips, and bumps in one breast and not the other. 6. Look at each breast for changes in your skin, such as: ? Redness. ? Scaly areas. 7. Look for changes in your nipples, such as: ? Discharge. ? Bleeding. ? Dimpling. ? Redness. ? A change in position. Feel for Changes Carefully feel your breasts for lumps and changes. It is best to do this while lying on your back on the floor and again while sitting or standing in the shower or tub with soapy water on your skin. Feel each breast in the following way:  Place the arm on the side of the breast you are examining above your head.  Feel your breast with the other hand.  Start in the nipple area and make  inch (2 cm) overlapping circles to feel your breast. Use the pads of your three middle fingers to do this. Apply light pressure, then medium pressure, then firm pressure. The light pressure will allow you to feel the tissue closest to the skin. The medium pressure will  allow you to feel the tissue that is a little deeper. The firm pressure will allow you to feel the tissue close to the ribs.  Continue the overlapping circles, moving downward over the breast until you feel your ribs below your breast.  Move one finger-width toward the center of the body. Continue to use the  inch (2 cm) overlapping circles to feel your breast as you move slowly up toward your collarbone.  Continue the up and down exam using all three pressures until you reach your armpit.  Write Down What You Find  Write down what is normal for each breast and any changes that you find. Keep a written record with breast changes or normal findings for each breast. By writing this information down, you do not need to depend only on memory for size, tenderness, or location.  Write down where you are in your menstrual cycle, if you are still menstruating. If you are having trouble noticing differences in your breasts, do not get discouraged. With time you will become more familiar with the variations in your breasts and more comfortable with the exam. How often should I examine my breasts? Examine your breasts every month. If you are breastfeeding, the best time to examine your breasts is after a feeding or after using a breast pump. If you menstruate, the best time to examine your breasts is 5-7 days after your period is over. During your period, your breasts are lumpier, and it may be more difficult to notice changes. When should I see my health care provider? See your health care provider if you notice:  A change in shape or size of your breasts or nipples.  A change in the skin of your breast or nipples, such as a reddened or scaly area.  Unusual discharge from your nipples.  A lump or thick area that was not there before.  Pain in your breasts.  Anything that concerns you.

## 2019-01-17 NOTE — Progress Notes (Signed)
58 y.o. G0P0 Married White or Caucasian Not Hispanic or Latino female here for annual exam.  No vaginal bleeding.   H/O PE on HRT, has anticardiolipin antibodies and protein S Deficiency.   Since her last visit she has been followed by Urology for UTI's and a kidney stone. She needs a refill of Trimpex for UTI prophylaxis with intercourse. Intercourse hurts on entry and deep inside.   They are trying to be sexually active. Some bleeding with attempt at intercourse, felt she tore.     She c/o a several year h/o intermittent pain in her lower abdomen/pelvis. It occurs ~1 x a month for ~30 minutes.  She does have irritable bowel and a kidney stone.   Patient's last menstrual period was 05/11/2012.          Sexually active: Yes.    The current method of family planning is post menopausal status.    Exercising: No.  The patient has a physically strenuous job, but has no regular exercise apart from work.  Smoker:  no  Health Maintenance: Pap: 07/02/2017 WNL, 06-17-16 Neg, 06-05-15 Neg:Neg HR HPV History of abnormal Pap:  Yes, Hx cryotherapy years ago over 30 years.  MMG:  11-12-16 Density C/Neg/BiRads1 Colonoscopy: 10-19-15 normal BMD:   never TDaP:  05-13-11 Gardasil: no   reports that she quit smoking about 22 years ago. Her smoking use included cigarettes. She has a 25.00 pack-year smoking history. She has never used smokeless tobacco. She reports current alcohol use. She reports that she does not use drugs. No ETOH. On disability.  Past Medical History:  Diagnosis Date  . Anxiety   . Atrial fibrillation ([place])    a. Event monitor 2013 - PACs/bradycardia/SVT/short run of atrial fib by event monitor.   Marland Kitchen BIPOLAR DISORDER UNSPECIFIED   . Bradycardia   . Bursitis of hip 09/1999   Bilateral - Dr. Berenice Primas  . DEPRESSION   . Emphysema lung ([place]) 06/20/2016   pt states pulmonalongist stated started recently  . GERD   . Headache(784.0)    was with menstrual cycle. no longer a problem  .  HYPERLIPIDEMIA   . Hypertension   . Hypertension   . IRRITABLE BOWEL SYNDROME, HX OF   . Menopausal state 01/2012   FSH = 88.5  . Mitral valve prolapse 12/17/2000   a. dx 1980s, most recent echo did not demonstrate this.  Marland Kitchen MYALGIA   . Normal coronary arteries    a. by cardiac CT 2013.  Marland Kitchen PAT (paroxysmal atrial tachycardia) ([place])   . PE (pulmonary embolism) 02/09/2015   a. Bilateral PEs 01/2015 when d-dimer 0.69, CP lying on left side. Followed by heme-onc - + lupus anticoagulant, mildly depressed protein S. Dr. Marin Olp is not certain if her hypercoagulable studies are significant for a thrombophilic state.  . Premature atrial contractions   . Pulmonary embolus ([place]) 02/10/2015  . Shortness of breath    Pulmonary eval 05/2002  . Thyroid nodule 2014  . Tremors of nervous system     Past Surgical History:  Procedure Laterality Date  . APPENDECTOMY    . COLONOSCOPY    . LAPAROSCOPIC APPENDECTOMY N/A 03/17/2017   Procedure: APPENDECTOMY LAPAROSCOPIC;  Surgeon: Excell Seltzer, MD;  Location: [facility];  Service: General;  Laterality: N/A;  . Lipoma (R) side  1990   Right back    Current Outpatient Medications  Medication Sig Dispense Refill  . ALPRAZolam (XANAX) 0.25 MG tablet TAKE 1 TO 2 TABLETS BY MOUTH TWICE DAILY AS  NEEDED FOR ANXIETY  1  . aspirin EC 325 MG tablet Take 325 mg by mouth daily.    Marland Kitchen buPROPion (WELLBUTRIN SR) 200 MG 12 hr tablet TAKE 1 TABLET BY MOUTH DAILY IN THE MORNING 90 tablet 0  . CARTIA XT 180 MG 24 hr capsule TAKE 1 CAPSULE BY MOUTH DAILY. 90 capsule 0  . DEXILANT 60 MG capsule Take 60 mg by mouth daily.  12  . divalproex (DEPAKOTE ER) 500 MG 24 hr tablet TAKE 2 TABLETS BY MOUTH AT BEDTIME. 180 tablet 0  . fluticasone (FLONASE) 50 MCG/ACT nasal spray Place 2 sprays into the nose daily as needed. For allergies    . lidocaine (XYLOCAINE) 5 % ointment Apply 1 application topically as needed.    . modafinil (PROVIGIL) 200 MG tablet 1/2 tablet in the morning  for 1 week and then if tolerated increase to 1 tablet each morning 30 tablet 1  . NONFORMULARY OR COMPOUNDED ITEM Azelaic acid; metronidazole / ivermectin cream  Bid    . NONFORMULARY OR COMPOUNDED ITEM Walker  #1  ---  Type per pt choice   Dx hx of falling 1 each 0  . Probiotic Product (PROBIOTIC DAILY PO) Take 1 tablet by mouth daily.     . propranolol (INDERAL) 20 MG tablet TAKE 1 TABLET BY MOUTH 2 TIMES DAILY. 180 tablet 0  . sertraline (ZOLOFT) 50 MG tablet TAKE 1 TABLET BY MOUTH DAILY. 90 tablet 0  . tretinoin (RETIN-A) 0.025 % cream as needed.   4  . trimethoprim (TRIMPEX) 100 MG tablet Take 1 tablet by mouth as needed. After intercourse     No current facility-administered medications for this visit.     Family History  Problem Relation Age of Onset  . Lung cancer Mother   . Hypertension Mother   . Hyperlipidemia Mother   . Cancer Father        Esophageal cancer  . Hyperlipidemia Father   . Depression Father   . Cancer Brother   . Pulmonary embolism Brother   . Colon cancer Paternal Uncle   . Sarcoidosis Other   . Testicular cancer Other     Review of Systems  Constitutional: Negative.   HENT: Negative.   Eyes: Negative.   Respiratory: Negative.   Cardiovascular: Negative.   Gastrointestinal: Negative.   Endocrine: Negative.   Genitourinary: Negative.   Musculoskeletal: Negative.   Skin: Negative.   Allergic/Immunologic: Negative.   Neurological: Negative.   Hematological: Negative.   Psychiatric/Behavioral: Negative.     Exam:   BP 106/60 (BP Location: Right Arm, Patient Position: Sitting, Cuff Size: Normal)   Pulse 72   Temp (!) 97 F (36.1 C) (Skin)   Ht 5\' 9"  (1.753 m)   Wt 179 lb 12.8 oz (81.6 kg)   LMP 05/11/2012   BMI 26.55 kg/m   Weight change: @WEIGHTCHANGE @ Height:   Height: 5\' 9"  (175.3 cm)  Ht Readings from Last 3 Encounters:  01/17/19 5\' 9"  (1.753 m)  06/11/18 5\' 9"  (1.753 m)  03/01/18 5\' 9"  (1.753 m)    General appearance: alert,  cooperative and appears stated age Head: Normocephalic, without obvious abnormality, atraumatic Neck: no adenopathy, supple, symmetrical, trachea midline and thyroid normal to inspection and palpation Lungs: clear to auscultation bilaterally Cardiovascular: regular rate and rhythm Breasts: normal appearance, no masses or tenderness Abdomen: soft, non-tender; non distended,  no masses,  no organomegaly Extremities: extremities normal, atraumatic, no cyanosis or edema Skin: Skin color, texture, turgor normal. No  rashes or lesions Lymph nodes: Cervical, supraclavicular, and axillary nodes normal. No abnormal inguinal nodes palpated Neurologic: Grossly normal   Pelvic: External genitalia:  no lesions, no tenderness at the vestibule              Urethra:  normal appearing urethra with no masses, tenderness or lesions              Bartholins and Skenes: normal                 Vagina: atrophic appearing vagina with normal color and discharge, no lesions. Able to insert one finger without discomfort              Cervix: no lesions               Bimanual Exam:  Uterus:  normal size, contour, position, consistency, mobility, non-tender              Adnexa: no masses, mildly tender on the right.               Rectovaginal: Confirms               Anus:  normal sphincter tone, no lesions  Bladder: mildly tender  Pelvic floor: not tender  Chaperone was present for exam.  A:  Well Woman with normal exam  Dyspareunia, no signs of vulvodynia or pelvic floor dysfunction.  Vaginal atrophy  H/O recurrent UTI    P:   Wants yearly pap, aware of recommendations.   Labs with primary  Recommended she schedule a mammogram  Continue Trimpex with intercourse  Lidocaine ointment prn intercourse.   Discussed vaginal dilators  Not a good candidate for vaginal estrogen.   Use lubricant with intercourse

## 2019-01-19 MED FILL — DEXILANT DR 60 MG CAPSULE: 60 | 30 days supply | Qty: 30 | Fill #4

## 2019-01-20 ENCOUNTER — Other Ambulatory Visit (HOSPITAL_COMMUNITY)
Admission: RE | Admit: 2019-01-20 | Discharge: 2019-01-20 | Disposition: A | Discharge status: Home / Self Care | Payer: 59 | Source: Ambulatory Visit | Attending: Obstetrics and Gynecology | Admitting: Obstetrics and Gynecology

## 2019-01-20 DIAGNOSIS — Z124 Encounter for screening for malignant neoplasm of cervix: Secondary | ICD-10-CM | POA: Insufficient documentation

## 2019-01-20 MED FILL — MODAFINIL 200 MG TABLET: 200 | 30 days supply | Qty: 30 | Fill #1

## 2019-01-20 NOTE — Addendum Note (Signed)
Addended by: Dorothy Spark on: 01/20/2019 04:32 PM   Modules accepted: Orders

## 2019-01-24 LAB — CYTOLOGY - PAP: Diagnosis: NEGATIVE

## 2019-01-24 MED FILL — TRIMETHOPRIM 100 MG TABLET: 100 | 30 days supply | Qty: 30 | Fill #0

## 2019-01-26 ENCOUNTER — Ambulatory Visit (INDEPENDENT_AMBULATORY_CARE_PROVIDER_SITE_OTHER): Payer: 59 | Admitting: Psychology

## 2019-01-26 DIAGNOSIS — F331 Major depressive disorder, recurrent, moderate: Secondary | ICD-10-CM

## 2019-02-16 ENCOUNTER — Ambulatory Visit: Payer: 59 | Admitting: Psychology

## 2019-02-28 ENCOUNTER — Other Ambulatory Visit: Payer: Self-pay | Admitting: Psychiatry

## 2019-02-28 ENCOUNTER — Other Ambulatory Visit: Payer: Self-pay | Admitting: Family Medicine

## 2019-02-28 DIAGNOSIS — F315 Bipolar disorder, current episode depressed, severe, with psychotic features: Secondary | ICD-10-CM

## 2019-02-28 DIAGNOSIS — I10 Essential (primary) hypertension: Secondary | ICD-10-CM

## 2019-03-01 MED FILL — MODAFINIL 200 MG TABLET: 200 | 30 days supply | Qty: 30 | Fill #0

## 2019-03-01 MED FILL — PROPRANOLOL 20 MG TABLET: 20 | 90 days supply | Qty: 180 | Fill #0

## 2019-03-01 MED FILL — DIVALPROEX SOD ER 500 MG TA: 500 | 90 days supply | Qty: 180 | Fill #0

## 2019-03-02 ENCOUNTER — Other Ambulatory Visit: Payer: Self-pay | Admitting: Psychiatry

## 2019-03-02 ENCOUNTER — Ambulatory Visit (INDEPENDENT_AMBULATORY_CARE_PROVIDER_SITE_OTHER): Payer: 59 | Admitting: Psychology

## 2019-03-02 DIAGNOSIS — F331 Major depressive disorder, recurrent, moderate: Secondary | ICD-10-CM

## 2019-03-02 MED FILL — DILTIAZEM HCL ER COATED BEA: 180 | 30 days supply | Qty: 30 | Fill #0

## 2019-03-03 MED FILL — SERTRALINE HCL 50 MG TABLET: 50 | 90 days supply | Qty: 90 | Fill #0

## 2019-03-07 ENCOUNTER — Telehealth: Payer: Self-pay | Admitting: Family Medicine

## 2019-03-07 DIAGNOSIS — I10 Essential (primary) hypertension: Secondary | ICD-10-CM

## 2019-03-07 NOTE — Telephone Encounter (Signed)
Pt called to schedule cpe and needed a Thursday. First available was 04/14/2019. Pt will need another refill on CARTIA XT 180 MG 24 hr capsule Before that appt. Please send in for her.

## 2019-03-08 MED ORDER — DILTIAZEM HCL ER COATED BEADS 180 MG PO CP24: 180.0000 mg | ORAL_CAPSULE | Freq: Every day | ORAL | 2 refills | Status: DC

## 2019-03-08 NOTE — Telephone Encounter (Signed)
Refill sent.

## 2019-03-22 MED FILL — DEXILANT DR 60 MG CAPSULE: 60 | 30 days supply | Qty: 30 | Fill #5

## 2019-03-23 ENCOUNTER — Ambulatory Visit (INDEPENDENT_AMBULATORY_CARE_PROVIDER_SITE_OTHER): Payer: 59 | Admitting: Psychology

## 2019-03-23 DIAGNOSIS — F331 Major depressive disorder, recurrent, moderate: Secondary | ICD-10-CM

## 2019-03-31 ENCOUNTER — Other Ambulatory Visit: Payer: Self-pay | Admitting: Family Medicine

## 2019-03-31 ENCOUNTER — Other Ambulatory Visit: Payer: Self-pay | Admitting: Psychiatry

## 2019-03-31 DIAGNOSIS — I10 Essential (primary) hypertension: Secondary | ICD-10-CM

## 2019-03-31 MED FILL — DILTIAZEM HCL ER COATED BEA: 180 | 30 days supply | Qty: 30 | Fill #0

## 2019-03-31 MED FILL — BUPROPION HCL SR 200 MG TAB: 200 | 90 days supply | Qty: 90 | Fill #0

## 2019-04-07 ENCOUNTER — Encounter: Payer: Self-pay | Admitting: Psychiatry

## 2019-04-07 ENCOUNTER — Ambulatory Visit (INDEPENDENT_AMBULATORY_CARE_PROVIDER_SITE_OTHER): Payer: 59 | Admitting: Psychiatry

## 2019-04-07 DIAGNOSIS — F314 Bipolar disorder, current episode depressed, severe, without psychotic features: Secondary | ICD-10-CM

## 2019-04-07 DIAGNOSIS — F40240 Claustrophobia: Secondary | ICD-10-CM

## 2019-04-07 DIAGNOSIS — F423 Hoarding disorder: Secondary | ICD-10-CM

## 2019-04-07 DIAGNOSIS — F411 Generalized anxiety disorder: Secondary | ICD-10-CM | POA: Diagnosis not present

## 2019-04-07 DIAGNOSIS — F4001 Agoraphobia with panic disorder: Secondary | ICD-10-CM

## 2019-04-07 MED ORDER — MODAFINIL 200 MG PO TABS: 300.0000 mg | ORAL_TABLET | Freq: Every day | ORAL | 1 refills | Status: DC

## 2019-04-07 NOTE — Progress Notes (Signed)
Beverly Cole [id number] 02-13-1960 59 y.o.  Virtual Visit via Telephone Note  I connected with pt by telephone and verified that I am speaking with the correct person using two identifiers.   I discussed the limitations, risks, security and privacy concerns of performing an evaluation and management service by telephone and the availability of in person appointments. I also discussed with the patient that there may be a patient responsible charge related to this service. The patient expressed understanding and agreed to proceed.  I discussed the assessment and treatment plan with the patient. The patient was provided an opportunity to ask questions and all were answered. The patient agreed with the plan and demonstrated an understanding of the instructions.   The patient was advised to call back or seek an in-person evaluation if the symptoms worsen or if the condition fails to improve as anticipated.  I provided 30 minutes of non-face-to-face time during this encounter. The call started at 230 and ended at 300. The patient was located at home and the provider was located office.  Subjective:   Patient ID:  Beverly Cole is a 59 y.o. (DOB [birth year]) female.  Chief Complaint:  No chief complaint on file.   Depression        Associated symptoms include no decreased concentration and no suicidal ideas.  Past medical history includes anxiety.   Anxiety Symptoms include dizziness, nervous/anxious behavior and shortness of breath. Patient reports no confusion, decreased concentration or suicidal ideas.     [name]  today for follow-up of chronic depression and anxiety.  Lately depression worse than anxiety.  Seen with husband today  When seen May 11, 2018.  For persistent anxiety we elected to retry buspirone and try increasing it to 30 mg twice daily if tolerated.   Couldn't tolerate it this time DT muscle spasms.  Pharmacy called saying she could have serotonin  syndrome.  When seen August 09, 2018 and she refused med changes despite chronic anxiety and depression. She remained on sertraline 50, Depakote ER 1000 mg, Wellbutrin SR 200 mg AM  Panic wearing cloth masks.  Using a shield.  Not in the office today bc H exposed to Covid.    Last seen December 09, 2018.  Because of chronic depression and dysfunction with fatigue and poor motivation we decided off label trial  trial of modafinil 200 mg 1/2 each am for 1 week then 200 mg each AM. Did see benefit from it.  More energy and working to stay out of bed more.    Last seen January 10, 2019.  The modafinil had been helpful.  No meds were changed.  Still sees benefit from modafinil but still not caring for herself like she should.  Not wanting to go anywhere.  Not driven since early fall.  Hard to breathe with the mask and it creates anxiety.  Hard to be in enclosed spaces like cars and planes for extended periods.  Cancelled trip to [place].  Trying to set her alarm clock.  No hallucinations.  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  Plans to send more too.  Still follow through is not great.  Still depression and productivity is still poor but it is not as poor as it was before modafinil.  Asks about melatonin HS.  Dizzy in the morning when gets up.    Attending therapy every 2 weeks.    Not  more sad, just like I've always been.  Can't make herself leave the house DT depression and anxiety.  She won't do chores.   Less excess sleeping.  He works from home 2 days weekly.  No periods of hyperactivity or manic sx since the spring.  Can be confused when first wakes up regardless of time of day. Has had fearful thoughts of planes crashing or bad things happening to others and it might be her fault.  Saw pulmonologist over weakness and he says she's OK except deconditioned.  Can't tolerate wearing the masks.  Was happy when got new cats  in the summer.  Anxious and Depressed and describes anxiety as Moderate. Anxiety symptoms include: Excessive Worry,.  She thinks sertraline reduces her obsessiveness.  Past hx panic so avoidant. Never get to relax.  Pt reports sleeps excessively even with modafinil. Pt reports that appetite is good. Pt reports that energy is poor and loss of interest or pleasure in usual activities, poor motivation and withdrawn from usual activities. Just don't care about things and admits to a lot of general anxiety and everything takes a lot of energy out of her.  Compulsively tapes and watches TV shows, news and awards shows.  Can't delete it bc I'll miss something.  Watches TV in bed. Concentration is poor. Suicidal thoughts:  denied by patient. But chronic death thoughts.  Poor productivity overall.  Chronic poor self care, showers only weekly.  Just don't care.  Chronic disability.  She thinks sertraline helped the anxiety some.  Likes that H is at home.  Helps her mood.  Doesn't care about going out.  Feels faint when she tries to wear a mask.  Too anxious driving and is avoidant.  Collects TV shows, she can't erase unless she watches entirely. Hoards some bc afraid she might need it some day.    Rare use of Xanax bc fears addiction.  Afraid to try new meds after negative reaction to Vraylar.  Afraid of any med change.  Afraid of having SI and afraid she'd do it if had SI. Hopeless about getting better.   Past psych med trials: Wellbutrin XL 300,  Sertraline  ECT 2016 without help,  Vraylar SI,  pramipexole,  Seroquel which caused side effects of constipation,  Abilify ,  lamotrigine,  Latuda,  refuses lithium because of altered taste.   She has a history of ataxia on 1500 mg of Depakote.   Recent buspirone SE hallucinations  Review of Systems:  Review of Systems  Respiratory: Positive for shortness of breath.   Gastrointestinal: Positive for abdominal pain.  Neurological: Positive for dizziness.  Negative for tremors and weakness.  Psychiatric/Behavioral: Positive for depression, dysphoric mood and sleep disturbance. Negative for agitation, behavioral problems, confusion, decreased concentration, hallucinations, self-injury and suicidal ideas. The patient is nervous/anxious. The patient is not hyperactive.     Medications: I have reviewed the patient's current medications.  Current Outpatient Medications  Medication Sig Dispense Refill  . ALPRAZolam (XANAX) 0.25 MG tablet TAKE 1 TO 2 TABLETS BY MOUTH TWICE DAILY AS NEEDED FOR ANXIETY  1  . aspirin EC 325 MG tablet Take 325 mg by mouth daily.    Marland Kitchen buPROPion (WELLBUTRIN SR) 200 MG 12 hr tablet TAKE 1 TABLET BY MOUTH DAILY IN THE MORNING 90 tablet 0  . DEXILANT 60 MG capsule Take 60 mg by mouth daily.  12  . diltiazem (CARTIA XT) 180 MG 24 hr capsule Take 1 capsule (180 mg total) by mouth  daily. 30 capsule 2  . divalproex (DEPAKOTE ER) 500 MG 24 hr tablet TAKE 2 TABLETS BY MOUTH AT BEDTIME. 180 tablet 0  . fluticasone (FLONASE) 50 MCG/ACT nasal spray Place 2 sprays into the nose daily as needed. For allergies    . lidocaine (XYLOCAINE) 5 % ointment Apply a pea sized amount topically 20-30 minutes prior to intercourse, wipe off just prior to intercourse. 30 g 1  . modafinil (PROVIGIL) 200 MG tablet Take 1.5 tablets (300 mg total) by mouth daily. TAKE 1 TABLET EACH MORNING 135 tablet 1  . NONFORMULARY OR COMPOUNDED ITEM Azelaic acid; metronidazole / ivermectin cream  Bid    . NONFORMULARY OR COMPOUNDED ITEM Walker  #1  ---  Type per pt choice   Dx hx of falling 1 each 0  . Probiotic Product (PROBIOTIC DAILY PO) Take 1 tablet by mouth daily.     . propranolol (INDERAL) 20 MG tablet TAKE 1 TABLET BY MOUTH 2 TIMES DAILY. 180 tablet 0  . sertraline (ZOLOFT) 50 MG tablet TAKE 1 TABLET BY MOUTH DAILY. 90 tablet 0  . tretinoin (RETIN-A) 0.025 % cream as needed.   4  . trimethoprim (TRIMPEX) 100 MG tablet After intercourse 30 tablet 1   No  current facility-administered medications for this visit.    Medication Side Effects: None  Allergies:  Allergies  Allergen Reactions  . Cariprazine Other (See Comments)    Patient becomes suicidal when taking this medication.  Patient becomes suicidal when taking this medication.   . Metoprolol Other (See Comments)    Hallucinations hair falls out Hallucinations hair falls out  . Nickel Rash    Past Medical History:  Diagnosis Date  . Anxiety   . Atrial fibrillation ([place])    a. Event monitor 2013 - PACs/bradycardia/SVT/short run of atrial fib by event monitor.   Marland Kitchen BIPOLAR DISORDER UNSPECIFIED   . Bradycardia   . Bursitis of hip 09/1999   Bilateral - Dr. Berenice Primas  . DEPRESSION   . Emphysema lung ([place]) 06/20/2016   pt states pulmonalongist stated started recently  . GERD   . Headache(784.0)    was with menstrual cycle. no longer a problem  . HYPERLIPIDEMIA   . Hypertension   . Hypertension   . IRRITABLE BOWEL SYNDROME, HX OF   . Menopausal state 01/2012   FSH = 88.5  . Mitral valve prolapse 12/17/2000   a. dx 1980s, most recent echo did not demonstrate this.  Marland Kitchen MYALGIA   . Normal coronary arteries    a. by cardiac CT 2013.  Marland Kitchen PAT (paroxysmal atrial tachycardia) ([place])   . PE (pulmonary embolism) 02/09/2015   a. Bilateral PEs 01/2015 when d-dimer 0.69, CP lying on left side. Followed by heme-onc - + lupus anticoagulant, mildly depressed protein S. Dr. Marin Olp is not certain if her hypercoagulable studies are significant for a thrombophilic state.  . Premature atrial contractions   . Pulmonary embolus ([place]) 02/10/2015  . Shortness of breath    Pulmonary eval 05/2002  . Thyroid nodule 2014  . Tremors of nervous system     Family History  Problem Relation Age of Onset  . Lung cancer Mother   . Hypertension Mother   . Hyperlipidemia Mother   . Cancer Father        Esophageal cancer  . Hyperlipidemia Father   . Depression Father   . Cancer Brother   . Pulmonary  embolism Brother   . Colon cancer Paternal Uncle   . Sarcoidosis  Other   . Testicular cancer Other     Social History   Socioeconomic History  . Marital status: Married    Spouse name: Not on file  . Number of children: 0  . Years of education: Not on file  . Highest education level: Not on file  Occupational History  . Occupation: Nurse-Personal Care/HH    Employer: UNEMPLOYED  Tobacco Use  . Smoking status: Former Smoker    Packs/day: 1.00    Years: 25.00    Pack years: 25.00    Types: Cigarettes    Quit date: 02/11/1996    Years since quitting: 23.1  . Smokeless tobacco: Never Used  . Tobacco comment: Married, lives with spouse. 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 Alcohol use: Yes    Comment: occ  . Drug use: No  . Sexual activity: Yes    Partners: Male    Birth control/protection: Post-menopausal  Other Topics Concern  . Not on file  Social History Narrative   Married, lives with spouse in [place]. 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 Difficulty of Paying Living Expenses: Not on file  Food Insecurity:   . Worried About Charity fundraiser in the Last Year: Not on file  . Ran Out of Food in the Last Year: Not on file  Transportation Needs:   . Lack of Transportation (Medical): Not on file  . Lack of Transportation (Non-Medical): Not on file  Physical Activity:   . Days of Exercise per Week: Not on file  . Minutes of Exercise per Session: Not on file  Stress:   . Feeling of Stress : Not on file  Social Connections:   . Frequency of Communication with Friends and Family: Not on file  . Frequency of Social Gatherings with Friends and Family: Not on file  . Attends Religious Services: Not on file  . Active Member of Clubs or Organizations: Not on file  . Attends Archivist Meetings: Not  on file  . Marital Status: Not on file  Intimate Partner Violence:   . Fear of Current or Ex-Partner: Not on file  . Emotionally Abused: Not on file  . Physically Abused: Not on file  . Sexually Abused: Not on file    Past Medical History, Surgical history, Social history, and Family history were reviewed and updated as appropriate.   Please see review of systems for further details on the patient's review from today.   Objective:   Physical Exam:  LMP 05/11/2012   Physical Exam Neurological:     Mental Status: She is alert and oriented to person, place, and time.     Cranial Nerves: No dysarthria.  Psychiatric:        Attention and Perception: Attention normal. She is attentive. She does not perceive auditory or visual hallucinations.        Mood and Affect: Mood is anxious and depressed.        Speech: Speech normal.        Behavior: Behavior is cooperative.        Thought Content: Thought content normal. Thought content is not paranoid or delusional. Thought content does not include homicidal or suicidal ideation. Thought content does not include homicidal or suicidal plan.  Cognition and Memory: Cognition and memory normal.     Comments: Insight is fair. .  Judgment fair     Lab Review:     Component Value Date/Time   NA 143 12/03/2018 1346   NA 142 01/21/2017 1013   NA 143 01/16/2016 1005   K 3.4 (L) 12/03/2018 1346   K 3.2 (L) 01/21/2017 1013   K 3.3 (L) 01/16/2016 1005   CL 107 12/03/2018 1346   CL 106 01/21/2017 1013   CO2 26 12/03/2018 1346   CO2 29 01/21/2017 1013   CO2 20 (L) 01/16/2016 1005   GLUCOSE 113 (H) 12/03/2018 1346   GLUCOSE 95 01/21/2017 1013   BUN 9 12/03/2018 1346   BUN 9 01/21/2017 1013   BUN 12.6 01/16/2016 1005   CREATININE 0.90 12/03/2018 1346   CREATININE 0.9 01/21/2017 1013   CREATININE 0.9 01/16/2016 1005   CALCIUM 9.1 12/03/2018 1346   CALCIUM 9.2 01/21/2017 1013   CALCIUM 9.5 01/16/2016 1005   PROT 7.1 12/03/2018 1346    PROT 6.9 01/21/2017 1013   PROT 7.1 01/16/2016 1005   ALBUMIN 4.5 12/03/2018 1346   ALBUMIN 3.3 01/21/2017 1013   ALBUMIN 3.6 01/16/2016 1005   AST 14 (L) 12/03/2018 1346   AST 16 01/16/2016 1005   ALT 16 12/03/2018 1346   ALT 22 01/21/2017 1013   ALT 24 01/16/2016 1005   ALKPHOS 55 12/03/2018 1346   ALKPHOS 62 01/21/2017 1013   ALKPHOS 81 01/16/2016 1005   BILITOT 0.3 12/03/2018 1346   BILITOT 0.59 01/16/2016 1005   GFRNONAA >60 12/03/2018 1346   GFRAA >60 12/03/2018 1346       Component Value Date/Time   WBC 7.7 12/03/2018 1346   WBC 8.3 11/26/2017 1451   RBC 4.64 12/03/2018 1346   HGB 13.4 12/03/2018 1346   HGB 12.8 01/21/2017 1013   HCT 40.9 12/03/2018 1346   HCT 38.2 01/21/2017 1013   PLT 264 12/03/2018 1346   PLT 176 01/21/2017 1013   MCV 88.1 12/03/2018 1346   MCV 90 01/21/2017 1013   MCH 28.9 12/03/2018 1346   MCHC 32.8 12/03/2018 1346   RDW 14.6 12/03/2018 1346   RDW 14.5 01/21/2017 1013   LYMPHSABS 3.7 12/03/2018 1346   LYMPHSABS 3.6 (H) 01/21/2017 1013   MONOABS 0.5 12/03/2018 1346   EOSABS 0.1 12/03/2018 1346   EOSABS 0.5 01/21/2017 1013   BASOSABS 0.1 12/03/2018 1346   BASOSABS 0.0 01/21/2017 1013    No results found for: POCLITH, LITHIUM   Lab Results  Component Value Date   PHENYTOIN <0.5 ug/mL (L) 11/05/2006   VALPROATE 73.7 11/25/2016     .res Assessment: Plan:    Diagnoses and all orders for this visit:  Bipolar disorder with severe depression ([place]) -     modafinil (PROVIGIL) 200 MG tablet; Take 1.5 tablets (300 mg total) by mouth daily. TAKE 1 TABLET EACH MORNING  Generalized anxiety disorder  Panic disorder with agoraphobia  Hoarding behavior  Claustrophobia   Poor sleep hygiene.  Improved with modafinil and motivation is better with it but wants to increase it.  Lifelong history of chronic anhedonic depression and anxiety with very poor functioning continues.  Prognosis is guarded bc multiple med failures and her resistance  to change and chronicity of sx and low motivation for change.  She gets some benefit from the current medications a little bit more active and interested and motivated with the addition of modafinil without side effects.  She still is highly  dysfunctional..  She is not currently manic.  She is tolerating the medications.  She is had multiple medication failures as noted above.  She is fearful of trying new medications because of a history of suicidal thoughts on Vraylar.    Chronic severe depression and poor function and negative thinking.  Consider olanzapine/fluoxetine combo but she fear of weight gain.  Consider off label Caplyta.  .there are other atypical antipsychotic mood stabilizers that could be considered but she refuses.  Has gained 15# since Covid.  New cats enjoys them..  Push fluids bc so much  Time in bed and past history of dehydration.  Disc use of the clear mask with the headband instead of the mask.  Claims she gets short of breath when she tries to use a regular mask.  She denies his claustrophobia but it does actually appear to be some degree of claustrophobia.  Increase activity.  Work on sleep schedule and try to regulate it for overall mental health benefits.  Keep working on improving activity.  Doesn't drive much.  Modafinil would be likely safer than traditional stimulants which could cause psychotic sx.  Discussed side effects.  She has had none.   Continue other meds.  Wonders about increasing the modafinil.  Agreed increase to 300 mg modafinil.  Still seeing therapist Arville Lime.  This appt was 30 mins.  FU 3 mo  Lynder Parents, MD, DFAPA   Please see After Visit Summary for patient specific instructions.  Future Appointments  Date Time Provider [place]  04/13/2019  3:00 PM Bauert, Nicolasa Ducking, LCSW [facility] None  04/14/2019  2:40 PM Lowne Sueko, Shafi, DO [facility]  05/04/2019  3:00 PM Bauert, Nicolasa Ducking, LCSW [facility] None  12/02/2019  2:30 PM [facility]  [facility] None  12/02/2019  3:00 PM Ennever, Rudell Cobb, MD [facility] None  01/18/2020  3:00 PM Salvadore Dom, MD [place] None    No orders of the defined types were placed in this encounter.     -------------------------------

## 2019-04-08 ENCOUNTER — Other Ambulatory Visit: Payer: Self-pay

## 2019-04-08 DIAGNOSIS — F314 Bipolar disorder, current episode depressed, severe, without psychotic features: Secondary | ICD-10-CM

## 2019-04-08 MED ORDER — MODAFINIL 200 MG PO TABS: 300.0000 mg | ORAL_TABLET | Freq: Every day | ORAL | 1 refills | Status: DC

## 2019-04-08 MED FILL — MODAFINIL 200 MG TABLET: 200 | 90 days supply | Qty: 135 | Fill #0

## 2019-04-13 ENCOUNTER — Ambulatory Visit (INDEPENDENT_AMBULATORY_CARE_PROVIDER_SITE_OTHER): Payer: 59 | Admitting: Psychology

## 2019-04-13 DIAGNOSIS — F331 Major depressive disorder, recurrent, moderate: Secondary | ICD-10-CM | POA: Diagnosis not present

## 2019-04-14 ENCOUNTER — Encounter: Payer: Self-pay | Admitting: Family Medicine

## 2019-04-14 ENCOUNTER — Other Ambulatory Visit: Payer: Self-pay

## 2019-04-14 ENCOUNTER — Other Ambulatory Visit: Payer: Self-pay | Admitting: Family Medicine

## 2019-04-14 ENCOUNTER — Ambulatory Visit (INDEPENDENT_AMBULATORY_CARE_PROVIDER_SITE_OTHER): Payer: 59 | Admitting: Family Medicine

## 2019-04-14 VITALS — BP 126/82 | HR 70 | Temp 97.6°F | Resp 18 | Ht 69.0 in | Wt 175.6 lb

## 2019-04-14 DIAGNOSIS — H1013 Acute atopic conjunctivitis, bilateral: Secondary | ICD-10-CM

## 2019-04-14 DIAGNOSIS — E039 Hypothyroidism, unspecified: Secondary | ICD-10-CM | POA: Diagnosis not present

## 2019-04-14 DIAGNOSIS — L719 Rosacea, unspecified: Secondary | ICD-10-CM | POA: Diagnosis not present

## 2019-04-14 DIAGNOSIS — Z0000 Encounter for general adult medical examination without abnormal findings: Secondary | ICD-10-CM

## 2019-04-14 DIAGNOSIS — I10 Essential (primary) hypertension: Secondary | ICD-10-CM

## 2019-04-14 DIAGNOSIS — K219 Gastro-esophageal reflux disease without esophagitis: Secondary | ICD-10-CM

## 2019-04-14 DIAGNOSIS — B351 Tinea unguium: Secondary | ICD-10-CM

## 2019-04-14 DIAGNOSIS — E2839 Other primary ovarian failure: Secondary | ICD-10-CM

## 2019-04-14 MED ORDER — DILTIAZEM HCL ER COATED BEADS 180 MG PO CP24: 180.0000 mg | ORAL_CAPSULE | Freq: Every day | ORAL | 1 refills | Status: DC

## 2019-04-14 MED ORDER — DEXILANT 60 MG PO CPDR: 60.0000 mg | DELAYED_RELEASE_CAPSULE | Freq: Every day | ORAL | 12 refills | Status: DC

## 2019-04-14 MED ORDER — AZELASTINE HCL 0.05 % OP SOLN: 1.0000 [drp] | Freq: Two times a day (BID) | OPHTHALMIC | 12 refills | Status: DC

## 2019-04-14 MED ORDER — CICLOPIROX 8 % EX SOLN: Freq: Every day | CUTANEOUS | 0 refills | Status: DC

## 2019-04-14 MED ORDER — NONFORMULARY OR COMPOUNDED ITEM: 5 refills | Status: DC

## 2019-04-14 MED FILL — CICLOPIROX 8% SOLUTION: 8 | 30 days supply | Qty: 7 | Fill #0

## 2019-04-14 MED FILL — AZELASTINE HCL 0.05% DROPS: 0.05 | 30 days supply | Qty: 6 | Fill #0

## 2019-04-14 NOTE — Progress Notes (Signed)
Subjective:     Beverly Cole is a 59 y.o. female and is here for a comprehensive physical exam. The patient reports problems - thick toenails  ,  she needs med refills , she has itchy watery eyes . She also has had a increase in libido that is really unusual for her --- she feels this is abnormal and will talk to her psych about this She also has hallucinations and will discuss this with her psych as well  Social History   Socioeconomic History  . Marital status: Married    Spouse name: Not on file  . Number of children: 0  . Years of education: Not on file  . Highest education level: Not on file  Occupational History  . Occupation: Nurse-Personal Care/HH    Employer: UNEMPLOYED  Tobacco Use  . Smoking status: Former Smoker    Packs/day: 1.00    Years: 25.00    Pack years: 25.00    Types: Cigarettes    Quit date: 02/11/1996    Years since quitting: 23.1  . Smokeless tobacco: Never Used  . Tobacco comment: Married, lives with spouse. 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 Alcohol use: Yes    Comment: occ  . Drug use: No  . Sexual activity: Yes    Partners: Male    Birth control/protection: Post-menopausal  Other Topics Concern  . Not on file  Social History Narrative   Married, lives with spouse in [place]. 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 Difficulty of Paying Living Expenses: Not on file  Food Insecurity:   . Worried About Charity fundraiser in the Last Year: Not on file  . Ran Out of Food in the Last Year: Not on file  Transportation Needs:   . Lack of Transportation (Medical): Not on file  . Lack of Transportation (Non-Medical): Not on file  Physical Activity:   . Days of Exercise per Week: Not on file  . Minutes of Exercise per Session: Not on file  Stress:   . Feeling of Stress : Not on file   Social Connections:   . Frequency of Communication with Friends and Family: Not on file  . Frequency of Social Gatherings with Friends and Family: Not on file  . Attends Religious Services: Not on file  . Active Member of Clubs or Organizations: Not on file  . Attends Archivist Meetings: Not on file  . Marital Status: Not on file  Intimate Partner Violence:   . Fear of Current or Ex-Partner: Not on file  . Emotionally Abused: Not on file  . Physically Abused: Not on file  . Sexually Abused: Not on file   Health Maintenance  Topic Date Due  . MAMMOGRAM  11/12/2017  . INFLUENZA VACCINE  06/08/2020 (Originally 09/11/2018)  . TETANUS/TDAP  05/12/2021  . PAP SMEAR-Modifier  01/19/2022  . COLONOSCOPY  11/27/2025  . Hepatitis C Screening  Completed  . HIV Screening  Completed    The following portions of the patient's history were reviewed and updated as appropriate:  She  has a past medical history of Anxiety, Atrial fibrillation ([place]), BIPOLAR DISORDER UNSPECIFIED, Bradycardia, Bursitis of hip (09/1999), DEPRESSION, Emphysema lung ([place]) (06/20/2016), GERD, Headache(784.0), HYPERLIPIDEMIA, Hypertension, Hypertension, IRRITABLE BOWEL SYNDROME, HX OF,  Menopausal state (01/2012), Mitral valve prolapse (12/17/2000), MYALGIA, Normal coronary arteries, PAT (paroxysmal atrial tachycardia) ([place]), PE (pulmonary embolism) (02/09/2015), Premature atrial contractions, Pulmonary embolus ([place]) (02/10/2015), Shortness of breath, Thyroid nodule (2014), and Tremors of nervous system. She does not have any pertinent problems on file. She  has a past surgical history that includes Lipoma (R) side (1990); Colonoscopy; laparoscopic appendectomy (N/A, 03/17/2017); and Appendectomy. Her family history includes Cancer in her brother and father; Colon cancer in her paternal uncle; Depression in her father; Hyperlipidemia in her father and mother; Hypertension in her mother; Lung cancer in her mother; Pulmonary  embolism in her brother; Sarcoidosis in an other family member; Testicular cancer in an other family member. She  reports that she quit smoking about 23 years ago. Her smoking use included cigarettes. She has a 25.00 pack-year smoking history. She has never used smokeless tobacco. She reports current alcohol use. She reports that she does not use drugs. She has a current medication list which includes the following prescription(s): alprazolam, aspirin ec, bupropion, dexilant, diltiazem, divalproex, fluticasone, lidocaine, modafinil, NONFORMULARY OR COMPOUNDED ITEM, NONFORMULARY OR COMPOUNDED ITEM, probiotic product, propranolol, sertraline, tretinoin, trimethoprim, azelastine, ciclopirox, and NONFORMULARY OR COMPOUNDED ITEM. Current Outpatient Medications on File Prior to Visit  Medication Sig Dispense Refill  . ALPRAZolam (XANAX) 0.25 MG tablet TAKE 1 TO 2 TABLETS BY MOUTH TWICE DAILY AS NEEDED FOR ANXIETY  1  . aspirin EC 325 MG tablet Take 325 mg by mouth daily.    Marland Kitchen buPROPion (WELLBUTRIN SR) 200 MG 12 hr tablet TAKE 1 TABLET BY MOUTH DAILY IN THE MORNING 90 tablet 0  . divalproex (DEPAKOTE ER) 500 MG 24 hr tablet TAKE 2 TABLETS BY MOUTH AT BEDTIME. 180 tablet 0  . fluticasone (FLONASE) 50 MCG/ACT nasal spray Place 2 sprays into the nose daily as needed. For allergies    . lidocaine (XYLOCAINE) 5 % ointment Apply a pea sized amount topically 20-30 minutes prior to intercourse, wipe off just prior to intercourse. 30 g 1  . modafinil (PROVIGIL) 200 MG tablet Take 1.5 tablets (300 mg total) by mouth daily. 135 tablet 1  . NONFORMULARY OR COMPOUNDED ITEM Azelaic acid; metronidazole / ivermectin cream  Bid    . NONFORMULARY OR COMPOUNDED ITEM Walker  #1  ---  Type per pt choice   Dx hx of falling 1 each 0  . Probiotic Product (PROBIOTIC DAILY PO) Take 1 tablet by mouth daily.     . propranolol (INDERAL) 20 MG tablet TAKE 1 TABLET BY MOUTH 2 TIMES DAILY. 180 tablet 0  . sertraline (ZOLOFT) 50 MG tablet  TAKE 1 TABLET BY MOUTH DAILY. 90 tablet 0  . tretinoin (RETIN-A) 0.025 % cream as needed.   4  . trimethoprim (TRIMPEX) 100 MG tablet After intercourse 30 tablet 1   No current facility-administered medications on file prior to visit.   She is allergic to cariprazine; metoprolol; and nickel..  Review of Systems Review of Systems  Constitutional: Negative for activity change, appetite change and fatigue.  HENT: Negative for hearing loss, congestion, tinnitus and ear discharge.  dentist q28m Eyes: Negative for visual disturbance (see optho q1y -- vision corrected to 20/20 with glasses).  Respiratory: Negative for cough, chest tightness and shortness of breath.   Cardiovascular: Negative for chest pain, palpitations and leg swelling.  Gastrointestinal: Negative for abdominal pain, diarrhea, constipation and abdominal distention.  Genitourinary: Negative for urgency, frequency, decreased urine volume and difficulty urinating.  Musculoskeletal: Negative for back pain, arthralgias and  gait problem.  Skin: Negative for color change, pallor and rash.  Neurological: Negative for dizziness, light-headedness, numbness and headaches.  Hematological: Negative for adenopathy. Does not bruise/bleed easily.  Psychiatric/Behavioral: Negative for suicidal ideas, confusion, sleep disturbance, self-injury, dysphoric mood, decreased concentration and agitation.       Objective:    BP 126/82 (BP Location: Right Arm, Patient Position: Sitting, Cuff Size: Normal)   Pulse 70   Temp 97.6 F (36.4 C) (Temporal)   Resp 18   Ht 5\' 9"  (1.753 m)   Wt 175 lb 9.6 oz (79.7 kg)   LMP 05/11/2012   SpO2 98%   BMI 25.93 kg/m  General appearance: alert, cooperative, appears stated age and no distress Head: Normocephalic, without obvious abnormality, atraumatic Eyes: negative findings: lids and lashes normal and conjunctivae and sclerae normal Ears: normal TM's and external ear canals both ears Neck: no  adenopathy, no carotid bruit, no JVD, supple, symmetrical, trachea midline and thyroid not enlarged, symmetric, no tenderness/mass/nodules Back: symmetric, no curvature. ROM normal. No CVA tenderness. Lungs: clear to auscultation bilaterally Breasts: gyn Heart: regular rate and rhythm, S1, S2 normal, no murmur, click, rub or gallop Abdomen: soft, non-tender; bowel sounds normal; no masses,  no organomegaly Pelvic: deferred Extremities: extremities normal, atraumatic, no cyanosis or edema== nails on L foot thick/ yellow  Pulses: 2+ and symmetric Skin: Skin color, texture, turgor normal. No rashes or lesions Lymph nodes: Cervical, supraclavicular, and axillary nodes normal. Neurologic: walks with cane    Assessment:    Healthy female exam.      Plan:    ghm utd Check labs  See After Visit Summary for Counseling Recommendations    1. Onychomycosis  - ciclopirox (PENLAC) 8 % solution; Apply topically at bedtime. Apply over nail and surrounding skin. Apply daily over previous coat. After seven (7) days, may remove with alcohol and continue cycle.  Dispense: 6.6 mL; Refill: 0  2. Rosacea, acne  - NONFORMULARY OR COMPOUNDED ITEM; sm Azelaic acid/ metronidazole/ivermectin 15%/1%/1% cream apply to face bid  Dispense: 1 each; Refill: 5  3. Gastroesophageal reflux disease, unspecified whether esophagitis present Stable-- refill meds  - DEXILANT 60 MG capsule; Take 1 capsule (60 mg total) by mouth daily.  Dispense: 30 capsule; Refill: 12  4. Essential hypertension Well controlled, no changes to meds. Encouraged heart healthy diet such as the DASH diet and exercise as tolerated.  - diltiazem (CARTIA XT) 180 MG 24 hr capsule; Take 1 capsule (180 mg total) by mouth daily.  Dispense: 90 capsule; Refill: 1 - CBC with Differential/Platelet - Comprehensive metabolic panel  5. Allergic conjunctivitis of both eyes  - azelastine (OPTIVAR) 0.05 % ophthalmic solution; Place 1 drop into both eyes 2  (two) times daily.  Dispense: 6 mL; Refill: 12  6. Hypothyroidism, unspecified type Stable Check labs  - TSH  7. Preventative health care See above  - Lipid panel

## 2019-04-14 NOTE — Patient Instructions (Signed)

## 2019-04-15 LAB — CBC WITH DIFFERENTIAL/PLATELET
Basophils Absolute: 0.1 10*3/uL (ref 0.0–0.1)
Basophils Relative: 0.8 % (ref 0.0–3.0)
Eosinophils Absolute: 0.1 10*3/uL (ref 0.0–0.7)
Eosinophils Relative: 1.7 % (ref 0.0–5.0)
HCT: 39.3 % (ref 36.0–46.0)
Hemoglobin: 13.4 g/dL (ref 12.0–15.0)
Lymphocytes Relative: 44.1 % (ref 12.0–46.0)
Lymphs Abs: 2.9 10*3/uL (ref 0.7–4.0)
MCHC: 34.1 g/dL (ref 30.0–36.0)
MCV: 86.9 fl (ref 78.0–100.0)
Monocytes Absolute: 0.5 10*3/uL (ref 0.1–1.0)
Monocytes Relative: 7.3 % (ref 3.0–12.0)
Neutro Abs: 3.1 10*3/uL (ref 1.4–7.7)
Neutrophils Relative %: 46.1 % (ref 43.0–77.0)
Platelets: 250 10*3/uL (ref 150.0–400.0)
RBC: 4.53 Mil/uL (ref 3.87–5.11)
RDW: 15.4 % (ref 11.5–15.5)
WBC: 6.7 10*3/uL (ref 4.0–10.5)

## 2019-04-15 LAB — COMPREHENSIVE METABOLIC PANEL
ALT: 20 U/L (ref 0–35)
AST: 19 U/L (ref 0–37)
Albumin: 4.3 g/dL (ref 3.5–5.2)
Alkaline Phosphatase: 62 U/L (ref 39–117)
BUN: 10 mg/dL (ref 6–23)
CO2: 26 mEq/L (ref 19–32)
Calcium: 9.6 mg/dL (ref 8.4–10.5)
Chloride: 101 mEq/L (ref 96–112)
Creatinine, Ser: 0.93 mg/dL (ref 0.40–1.20)
GFR: 61.62 mL/min (ref >60.00)
Glucose, Bld: 80 mg/dL (ref 70–99)
Potassium: 3.9 mEq/L (ref 3.5–5.1)
Sodium: 138 mEq/L (ref 135–145)
Total Bilirubin: 0.4 mg/dL (ref 0.2–1.2)
Total Protein: 7.1 g/dL (ref 6.0–8.3)

## 2019-04-15 LAB — LDL CHOLESTEROL, DIRECT: Direct LDL: 227 mg/dL

## 2019-04-15 LAB — LIPID PANEL
Cholesterol: 321 mg/dL — ABNORMAL HIGH (ref 0–200)
HDL: 43 mg/dL (ref >39.00)
NonHDL: 278.38
Total CHOL/HDL Ratio: 7
Triglycerides: 359 mg/dL — ABNORMAL HIGH (ref 0.0–149.0)
VLDL: 71.8 mg/dL — ABNORMAL HIGH (ref 0.0–40.0)

## 2019-04-15 LAB — TSH: TSH: 3.23 u[IU]/mL (ref 0.35–4.50)

## 2019-04-15 MED FILL — DEXILANT DR 60 MG CAPSULE: 60 | 30 days supply | Qty: 30 | Fill #0

## 2019-04-18 ENCOUNTER — Encounter: Payer: Self-pay | Admitting: Family Medicine

## 2019-04-18 ENCOUNTER — Other Ambulatory Visit: Payer: Self-pay | Admitting: Family Medicine

## 2019-04-18 DIAGNOSIS — E785 Hyperlipidemia, unspecified: Secondary | ICD-10-CM

## 2019-04-19 ENCOUNTER — Other Ambulatory Visit: Payer: Self-pay

## 2019-04-19 MED ORDER — ROSUVASTATIN CALCIUM 10 MG PO TABS: 10.0000 mg | ORAL_TABLET | Freq: Every day | ORAL | 2 refills | Status: DC

## 2019-04-19 MED FILL — ROSUVASTATIN CALCIUM 10 MG: 10 | 30 days supply | Qty: 30 | Fill #0

## 2019-05-04 ENCOUNTER — Ambulatory Visit (INDEPENDENT_AMBULATORY_CARE_PROVIDER_SITE_OTHER): Payer: 59 | Admitting: Psychology

## 2019-05-04 DIAGNOSIS — F331 Major depressive disorder, recurrent, moderate: Secondary | ICD-10-CM

## 2019-05-10 ENCOUNTER — Encounter: Payer: Self-pay | Admitting: Family Medicine

## 2019-05-11 ENCOUNTER — Encounter: Payer: Self-pay | Admitting: Family Medicine

## 2019-05-23 ENCOUNTER — Encounter: Payer: Self-pay | Admitting: Dermatology

## 2019-05-25 ENCOUNTER — Ambulatory Visit (INDEPENDENT_AMBULATORY_CARE_PROVIDER_SITE_OTHER): Payer: 59 | Admitting: Psychology

## 2019-05-25 DIAGNOSIS — F331 Major depressive disorder, recurrent, moderate: Secondary | ICD-10-CM | POA: Diagnosis not present

## 2019-05-27 ENCOUNTER — Other Ambulatory Visit: Payer: Self-pay | Admitting: Psychiatry

## 2019-05-27 MED FILL — DIVALPROEX SOD ER 500 MG TA: 500 | 90 days supply | Qty: 180 | Fill #0

## 2019-05-27 MED FILL — PROPRANOLOL 20 MG TABLET: 20 | 90 days supply | Qty: 180 | Fill #0

## 2019-05-27 MED FILL — SERTRALINE HCL 50 MG TABLET: 50 | 90 days supply | Qty: 90 | Fill #0

## 2019-05-27 MED FILL — DILTIAZEM HCL ER COATED BEA: 180 | 30 days supply | Qty: 30 | Fill #1

## 2019-05-27 MED FILL — DEXILANT DR 60 MG CAPSULE: 60 | 30 days supply | Qty: 30 | Fill #1

## 2019-06-02 ENCOUNTER — Encounter: Payer: Self-pay | Admitting: Family Medicine

## 2019-06-03 NOTE — Telephone Encounter (Signed)
Documented

## 2019-06-22 ENCOUNTER — Ambulatory Visit (INDEPENDENT_AMBULATORY_CARE_PROVIDER_SITE_OTHER): Payer: 59 | Admitting: Psychology

## 2019-06-22 DIAGNOSIS — F331 Major depressive disorder, recurrent, moderate: Secondary | ICD-10-CM

## 2019-06-23 ENCOUNTER — Encounter: Payer: Self-pay | Admitting: Family Medicine

## 2019-06-24 ENCOUNTER — Other Ambulatory Visit: Payer: Self-pay

## 2019-06-24 ENCOUNTER — Encounter: Payer: Self-pay | Admitting: Internal Medicine

## 2019-06-24 ENCOUNTER — Ambulatory Visit: Payer: 59 | Admitting: Internal Medicine

## 2019-06-24 VITALS — BP 106/55 | HR 86 | Temp 98.4°F | Resp 18 | Ht 69.0 in | Wt 176.5 lb

## 2019-06-24 DIAGNOSIS — R1011 Right upper quadrant pain: Secondary | ICD-10-CM | POA: Diagnosis not present

## 2019-06-24 NOTE — Patient Instructions (Signed)
Please go to the emergency room at [facility], I already talk with the physician there.

## 2019-06-24 NOTE — Progress Notes (Signed)
Subjective:    Patient ID: Beverly Cole, female    DOB: Mar 16, 1960, 59 y.o.   MRN: [id number]  DOS:  06/24/2019 Type of visit - description: Acute, the patient is here with her husband. Symptoms started 3 days ago, the pain is located at the right upper quadrant, on and off. The pain is worse when he tries to move. He denies any injury or fall recently. Her appetite is gone, she is not eating or drinking fluids.  I asked why she is not eating: She is simply not hungry.  Unclear if p.o. intake increases her pain.  She denies fevers, had some chills with the onset of the pain 3 days ago. Has IBS with occasional nausea and diarrhea which is not unusual for her.  Also she has some constipation. Denies dysuria or gross hematuria.  Review of Systems See above   Past Medical History:  Diagnosis Date  . Anxiety   . Atrial fibrillation ([place])    a. Event monitor 2013 - PACs/bradycardia/SVT/short run of atrial fib by event monitor.   Marland Kitchen BIPOLAR DISORDER UNSPECIFIED   . Bradycardia   . Bursitis of hip 09/1999   Bilateral - Dr. Berenice Primas  . DEPRESSION   . Emphysema lung ([place]) 06/20/2016   pt states pulmonalongist stated started recently  . GERD   . Headache(784.0)    was with menstrual cycle. no longer a problem  . HYPERLIPIDEMIA   . Hypertension   . Hypertension   . IRRITABLE BOWEL SYNDROME, HX OF   . Menopausal state 01/2012   FSH = 88.5  . Mitral valve prolapse 12/17/2000   a. dx 1980s, most recent echo did not demonstrate this.  Marland Kitchen MYALGIA   . Normal coronary arteries    a. by cardiac CT 2013.  Marland Kitchen PAT (paroxysmal atrial tachycardia) ([place])   . PE (pulmonary embolism) 02/09/2015   a. Bilateral PEs 01/2015 when d-dimer 0.69, CP lying on left side. Followed by heme-onc - + lupus anticoagulant, mildly depressed protein S. Dr. Marin Olp is not certain if her hypercoagulable studies are significant for a thrombophilic state.  . Premature atrial contractions   . Pulmonary embolus ([place])  02/10/2015  . Shortness of breath    Pulmonary eval 05/2002  . Thyroid nodule 2014  . Tremors of nervous system     Past Surgical History:  Procedure Laterality Date  . COLONOSCOPY    . LAPAROSCOPIC APPENDECTOMY N/A 03/17/2017   Procedure: APPENDECTOMY LAPAROSCOPIC;  Surgeon: Excell Seltzer, MD;  Location: [facility];  Service: General;  Laterality: N/A;  . Lipoma (R) side  1990   Right back    Allergies as of 06/24/2019      Reactions   Cariprazine Other (See Comments)   Patient becomes suicidal when taking this medication.  Patient becomes suicidal when taking this medication.    Metoprolol Other (See Comments)   Hallucinations hair falls out Hallucinations hair falls out   Nickel Rash      Medication List       Accurate as of Jun 24, 2019  4:50 PM. If you have any questions, ask your nurse or doctor.        ALPRAZolam 0.25 MG tablet Commonly known as: XANAX TAKE 1 TO 2 TABLETS BY MOUTH TWICE DAILY AS NEEDED FOR ANXIETY   aspirin EC 325 MG tablet Take 325 mg by mouth daily.   azelastine 0.05 % ophthalmic solution Commonly known as: OPTIVAR Place 1 drop into both eyes 2 (two) times daily.  buPROPion 200 MG 12 hr tablet Commonly known as: WELLBUTRIN SR TAKE 1 TABLET BY MOUTH DAILY IN THE MORNING   ciclopirox 8 % solution Commonly known as: Penlac Apply topically at bedtime. Apply over nail and surrounding skin. Apply daily over previous coat. After seven (7) days, may remove with alcohol and continue cycle.   Dexilant 60 MG capsule Generic drug: dexlansoprazole Take 1 capsule (60 mg total) by mouth daily.   diltiazem 180 MG 24 hr capsule Commonly known as: Cartia XT Take 1 capsule (180 mg total) by mouth daily.   divalproex 500 MG 24 hr tablet Commonly known as: DEPAKOTE ER TAKE 2 TABLETS BY MOUTH AT BEDTIME.   fluticasone 50 MCG/ACT nasal spray Commonly known as: FLONASE Place 2 sprays into the nose daily as needed. For allergies   lidocaine 5 %  ointment Commonly known as: XYLOCAINE Apply a pea sized amount topically 20-30 minutes prior to intercourse, wipe off just prior to intercourse.   modafinil 200 MG tablet Commonly known as: PROVIGIL Take 1.5 tablets (300 mg total) by mouth daily.   NONFORMULARY OR COMPOUNDED ITEM Azelaic acid; metronidazole / ivermectin cream  Bid   NONFORMULARY OR COMPOUNDED ITEM Walker  #1  ---  Type per pt choice   Dx hx of falling   NONFORMULARY OR COMPOUNDED ITEM sm Azelaic acid/ metronidazole/ivermectin 15%/1%/1% cream apply to face bid   PROBIOTIC DAILY PO Take 1 tablet by mouth daily.   propranolol 20 MG tablet Commonly known as: INDERAL TAKE 1 TABLET BY MOUTH 2 TIMES DAILY.   rosuvastatin 10 MG tablet Commonly known as: Crestor Take 1 tablet (10 mg total) by mouth daily.   sertraline 50 MG tablet Commonly known as: ZOLOFT TAKE 1 TABLET BY MOUTH DAILY.   tretinoin 0.025 % cream Commonly known as: RETIN-A as needed.   trimethoprim 100 MG tablet Commonly known as: TRIMPEX After intercourse          Objective:   Physical Exam BP (!) 106/55 (BP Location: Left Arm, Patient Position: Sitting, Cuff Size: Small)   Pulse 86   Temp 98.4 F (36.9 C) (Temporal)   Resp 18   Ht 5\' 9"  (1.753 m)   Wt 176 lb 8 oz (80.1 kg)   LMP 05/11/2012   SpO2 97%   BMI 26.06 kg/m  Exam performed while her husband was in the room with her.  General:   Well developed, not in acute distress but she looks uncomfortable, walks with assisted by walker.  She is a slightly diaphoretic. HEENT:  Normocephalic . Face symmetric, atraumatic Lungs:  CTA B Normal respiratory effort, no intercostal retractions, no accessory muscle use. Heart: RRR,  no murmur.  Abdomen:  Not distended, soft, + tenderness at the right side of the abdomen ,   RUQ > mid right side.  No mass or rebound.  Bowel sounds present. Skin: Not pale. Not jaundice Lower extremities: no pretibial edema bilaterally  Neurologic:    alert & oriented X3.  Speech normal, gait assisted by a walker Psych--  Cognition and judgment appear intact.  Cooperative with normal attention span and concentration.  Behavior appropriate. No anxious or depressed appearing.     Assessment      59 year old female, PMH includes HTN, SVT, acute perforated appendicitis 03/17/2017, IBS, thyroid disease, bipolar, pulmonary emboli 2017, on multiple medications, not anticoagulated, presents with:  Right-sided abdominal pain: As described above, she seems to be quite uncomfortable, abdomen is certainly tender but without mass or rebound.  She is afebrile, pulse 98. I asked for a urine sample but she was not able to provide. She needs immediate further evaluation, DDx include acute cholecystitis, versus others. I spoke with the [facility] physician who agreed to receive the patient. I asked the husband if she could drive her and he said he will.  Will go via private vehicle.   This visit occurred during the SARS-CoV-2 public health emergency.  Safety protocols were in place, including screening questions prior to the visit, additional usage of staff PPE, and extensive cleaning of exam room while observing appropriate contact time as indicated for disinfecting solutions.

## 2019-06-24 NOTE — Progress Notes (Signed)
Pre visit review using our clinic review tool, if applicable. No additional management support is needed unless otherwise documented below in the visit note. 

## 2019-06-27 ENCOUNTER — Other Ambulatory Visit: Payer: Self-pay | Admitting: Psychiatry

## 2019-06-28 ENCOUNTER — Encounter: Payer: Self-pay | Admitting: Family Medicine

## 2019-06-28 DIAGNOSIS — I10 Essential (primary) hypertension: Secondary | ICD-10-CM

## 2019-06-28 MED FILL — BUPROPION HCL SR 200 MG TAB: 200 | 90 days supply | Qty: 90 | Fill #0

## 2019-06-29 ENCOUNTER — Telehealth (INDEPENDENT_AMBULATORY_CARE_PROVIDER_SITE_OTHER): Payer: 59 | Admitting: Psychiatry

## 2019-06-29 ENCOUNTER — Telehealth: Payer: Self-pay | Admitting: Psychiatry

## 2019-06-29 ENCOUNTER — Encounter: Payer: Self-pay | Admitting: Psychiatry

## 2019-06-29 DIAGNOSIS — F411 Generalized anxiety disorder: Secondary | ICD-10-CM

## 2019-06-29 DIAGNOSIS — F4001 Agoraphobia with panic disorder: Secondary | ICD-10-CM

## 2019-06-29 DIAGNOSIS — F314 Bipolar disorder, current episode depressed, severe, without psychotic features: Secondary | ICD-10-CM

## 2019-06-29 DIAGNOSIS — G250 Essential tremor: Secondary | ICD-10-CM

## 2019-06-29 DIAGNOSIS — F40233 Fear of injury: Secondary | ICD-10-CM | POA: Diagnosis not present

## 2019-06-29 DIAGNOSIS — F40240 Claustrophobia: Secondary | ICD-10-CM

## 2019-06-29 DIAGNOSIS — L719 Rosacea, unspecified: Secondary | ICD-10-CM | POA: Insufficient documentation

## 2019-06-29 DIAGNOSIS — F423 Hoarding disorder: Secondary | ICD-10-CM | POA: Diagnosis not present

## 2019-06-29 MED ORDER — DILTIAZEM HCL ER COATED BEADS 180 MG PO CP24: 180.0000 mg | ORAL_CAPSULE | Freq: Every day | ORAL | 1 refills | Status: DC

## 2019-06-29 MED FILL — DEXILANT DR 60 MG CAPSULE: 60 | 30 days supply | Qty: 30 | Fill #2

## 2019-06-29 MED FILL — DILTIAZEM HCL ER COATED BEA: 180 | 30 days supply | Qty: 30 | Fill #2

## 2019-06-29 MED FILL — MODAFINIL 200 MG TABLET: 200 | 90 days supply | Qty: 135 | Fill #1

## 2019-06-29 NOTE — Telephone Encounter (Signed)
Ms. Beverly Cole, remaly are scheduled for a virtual visit with your provider today.    Just as we do with appointments in the office, we must obtain your consent to participate.  Your consent will be active for this visit and any virtual visit you may have with one of our providers in the next 365 days.    If you have a MyChart account, I can also send a copy of this consent to you electronically.  All virtual visits are billed to your insurance company just like a traditional visit in the office.  As this is a virtual visit, video technology does not allow for your provider to perform a traditional examination.  This may limit your provider's ability to fully assess your condition.  If your provider identifies any concerns that need to be evaluated in person or the need to arrange testing such as labs, EKG, etc, we will make arrangements to do so.    Although advances in technology are sophisticated, we cannot ensure that it will always work on either your end or our end.  If the connection with a video visit is poor, we may have to switch to a telephone visit.  With either a video or telephone visit, we are not always able to ensure that we have a secure connection.   I need to obtain your verbal consent now.   Are you willing to proceed with your visit today?   CARALENA HASCH has provided verbal consent on 06/29/2019 for a virtual visit (video or telephone).   Purnell Shoemaker, MD 06/29/2019  3:07 PM

## 2019-06-29 NOTE — Progress Notes (Signed)
BREYLYNN HAID [id number] [birth year] 59 y.o.  Virtual Visit via [place]  I connected with pt by WebEx and verified that I am speaking with the correct person using two identifiers.   I discussed the limitations, risks, security and privacy concerns of performing an evaluation and management service by Jackquline Denmark and the availability of in person appointments. I also discussed with the patient that there may be a patient responsible charge related to this service. The patient expressed understanding and agreed to proceed.  I discussed the assessment and treatment plan with the patient. The patient was provided an opportunity to ask questions and all were answered. The patient agreed with the plan and demonstrated an understanding of the instructions.   The patient was advised to call back or seek an in-person evaluation if the symptoms worsen or if the condition fails to improve as anticipated.  I provided 30 minutes of video time during this encounter. The call started at 100 and ended at 1:30. The patient was located at home and the provider was located office.   Subjective:   Patient ID:  Beverly Cole is a 59 y.o. (DOB [birth year]) female.  Chief Complaint:  Chief Complaint  Patient presents with  . Follow-up    med changes  . Anxiety  . Depression    Depression        Associated symptoms include no decreased concentration and no suicidal ideas.  Past medical history includes anxiety.   Anxiety Symptoms include dizziness, nervous/anxious behavior and shortness of breath. Patient reports no confusion, decreased concentration or suicidal ideas.     [name]  today for follow-up of chronic depression and anxiety.  Lately depression worse than anxiety.  Seen with husband today  When seen May 11, 2018.  For persistent anxiety we elected to retry buspirone and try increasing it to 30 mg twice daily if tolerated.   Couldn't tolerate it this time DT muscle spasms.  Pharmacy called  saying she could have serotonin syndrome.  When seen August 09, 2018 and she refused med changes despite chronic anxiety and depression. She remained on sertraline 50, Depakote ER 1000 mg, Wellbutrin SR 200 mg AM  Panic wearing cloth masks.  Using a shield.  Not in the office today bc H exposed to Covid.    seen December 09, 2018.  Because of chronic depression and dysfunction with fatigue and poor motivation we decided off label trial  trial of modafinil 200 mg 1/2 each am for 1 week then 200 mg each AM. Did see benefit from it.  More energy and working to stay out of bed more.    seen January 10, 2019.  The modafinil had been helpful.  No meds were changed.  I seen April 07, 2019.  The following was noted: Still sees benefit from modafinil but still not caring for herself like she should.  Not wanting to go anywhere.  Not driven since early fall.  Hard to breathe with the mask and it creates anxiety.  Hard to be in enclosed spaces like cars and planes for extended periods.   Trying to set her alarm clock.  No hallucinations.  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  Plans to send more too.  Still follow through is not great.  Still depression and productivity is still poor but it is not as poor as it was before modafinil. Plan increase modafinil to 300  mg daily to see if function can be improved.  06/29/2019 appointment, the following is noted: Rare Xanax.  Did increase modafinil to 300 mg daily. Saw benefit with the increase with less time in bed but still markedly functionally impaired.   It seems like I get used to it and then doesn't maintain her get up and go.  Still having a hard time with self care like hygiene. Chronic low motivation and lack of interest is unchanged.   Gall bladder problems.  It's better at the moment.    Not more sad, just like I've always been.  Can't make herself leave the house DT depression  and anxiety.  She won't do chores.   Less excess sleeping.  He works from home 2 days weekly.  No periods of hyperactivity or manic sx since the spring. Attending therapy every 2 weeks.    Can be confused when first wakes up regardless of time of day. Had episode of hallucination in the middle of the night when awakened. Scarecrow frightened her. This is rare event.  No hallucinations during the day.  Melatonin makes it worse. Has had fearful thoughts of planes crashing or bad things happening to others and it might be her fault.  Fight with brother lately and not speaking with him now.  Was happy when got new cats in the summer.  Anxious and Depressed and describes anxiety as Moderate. Anxiety symptoms include: Excessive Worry,.  She thinks sertraline reduces her obsessiveness.  Past hx panic so avoidant. Never get to relax.  Pt reports sleeps excessively even with modafinil. Pt reports that appetite is good. Pt reports that energy is poor and loss of interest or pleasure in usual activities, poor motivation and withdrawn from usual activities. Just don't care about things and admits to a lot of general anxiety and everything takes a lot of energy out of her.  Compulsively tapes and watches TV shows, news and awards shows.  Can't delete it bc I'll miss something.  Watches TV in bed. Concentration is poor. Suicidal thoughts:  denied by patient. But chronic death thoughts.  Poor productivity overall.  Chronic poor self care, showers only weekly.  Just don't care.  Chronic disability.  She thinks sertraline helped the anxiety some.  Likes that H is at home.  Helps her mood.  Doesn't care about going out.  Feels faint when she tries to wear a mask.  Too anxious driving and is avoidant.  Collects TV shows, she can't erase unless she watches entirely. Hoards some bc afraid she might need it some day.    Rare use of Xanax bc fears addiction.  Afraid to try new meds after negative reaction to Vraylar.   Afraid of any med change.  Afraid of having SI and afraid she'd do it if had SI. Hopeless about getting better.  Saw pulmonologist over weakness and he says she's OK except deconditioned.  Can't tolerate wearing the masks.  Past psych med trials: Wellbutrin XL 300,  Sertraline  ECT 2016 without help,  Vraylar SI,  pramipexole,  Seroquel which caused side effects of constipation,  Abilify ,  lamotrigine,  Latuda,  refuses lithium because of altered taste.   She has a history of ataxia on 1500 mg of Depakote.   Recent buspirone SE hallucinations  Review of Systems:  Review of Systems  Respiratory: Positive for shortness of breath.   Gastrointestinal: Positive for abdominal pain.  Neurological: Positive for dizziness. Negative for tremors and weakness.  Psychiatric/Behavioral:  Positive for depression, dysphoric mood and sleep disturbance. Negative for agitation, behavioral problems, confusion, decreased concentration, hallucinations, self-injury and suicidal ideas. The patient is nervous/anxious. The patient is not hyperactive.     Medications: I have reviewed the patient's current medications.  Current Outpatient Medications  Medication Sig Dispense Refill  . ALPRAZolam (XANAX) 0.25 MG tablet TAKE 1 TO 2 TABLETS BY MOUTH TWICE DAILY AS NEEDED FOR ANXIETY  1  . aspirin EC 325 MG tablet Take 325 mg by mouth daily.    Marland Kitchen azelastine (OPTIVAR) 0.05 % ophthalmic solution Place 1 drop into both eyes 2 (two) times daily. 6 mL 12  . buPROPion (WELLBUTRIN SR) 200 MG 12 hr tablet TAKE 1 TABLET BY MOUTH DAILY IN THE MORNING 90 tablet 0  . ciclopirox (PENLAC) 8 % solution Apply topically at bedtime. Apply over nail and surrounding skin. Apply daily over previous coat. After seven (7) days, may remove with alcohol and continue cycle. 6.6 mL 0  . DEXILANT 60 MG capsule Take 1 capsule (60 mg total) by mouth daily. 30 capsule 12  . diltiazem (CARTIA XT) 180 MG 24 hr capsule Take 1 capsule (180 mg  total) by mouth daily. 90 capsule 1  . divalproex (DEPAKOTE ER) 500 MG 24 hr tablet TAKE 2 TABLETS BY MOUTH AT BEDTIME. 180 tablet 0  . fluticasone (FLONASE) 50 MCG/ACT nasal spray Place 2 sprays into the nose daily as needed. For allergies    . lidocaine (XYLOCAINE) 5 % ointment Apply a pea sized amount topically 20-30 minutes prior to intercourse, wipe off just prior to intercourse. 30 g 1  . modafinil (PROVIGIL) 200 MG tablet Take 1.5 tablets (300 mg total) by mouth daily. 135 tablet 1  . NONFORMULARY OR COMPOUNDED ITEM Azelaic acid; metronidazole / ivermectin cream  Bid    . NONFORMULARY OR COMPOUNDED ITEM Walker  #1  ---  Type per pt choice   Dx hx of falling 1 each 0  . NONFORMULARY OR COMPOUNDED ITEM sm Azelaic acid/ metronidazole/ivermectin 15%/1%/1% cream apply to face bid 1 each 5  . Probiotic Product (PROBIOTIC DAILY PO) Take 1 tablet by mouth daily.     . propranolol (INDERAL) 20 MG tablet TAKE 1 TABLET BY MOUTH 2 TIMES DAILY. 180 tablet 0  . sertraline (ZOLOFT) 50 MG tablet TAKE 1 TABLET BY MOUTH DAILY. 90 tablet 0  . tretinoin (RETIN-A) 0.025 % cream as needed.   4  . trimethoprim (TRIMPEX) 100 MG tablet After intercourse 30 tablet 1  . rosuvastatin (CRESTOR) 10 MG tablet Take 1 tablet (10 mg total) by mouth daily. (Patient not taking: Reported on 06/24/2019) 30 tablet 2   No current facility-administered medications for this visit.    Medication Side Effects: None  Allergies:  Allergies  Allergen Reactions  . Cariprazine Other (See Comments)    Patient becomes suicidal when taking this medication.  Patient becomes suicidal when taking this medication.   . Metoprolol Other (See Comments)    Hallucinations hair falls out Hallucinations hair falls out  . Nickel Rash    Past Medical History:  Diagnosis Date  . Anxiety   . Atrial fibrillation ([place])    a. Event monitor 2013 - PACs/bradycardia/SVT/short run of atrial fib by event monitor.   Marland Kitchen BIPOLAR DISORDER UNSPECIFIED    . Bradycardia   . Bursitis of hip 09/1999   Bilateral - Dr. Berenice Primas  . DEPRESSION   . Emphysema lung ([place]) 06/20/2016   pt states pulmonalongist stated started recently  .  GERD   . Headache(784.0)    was with menstrual cycle. no longer a problem  . HYPERLIPIDEMIA   . Hypertension   . Hypertension   . IRRITABLE BOWEL SYNDROME, HX OF   . Menopausal state 01/2012   FSH = 88.5  . Mitral valve prolapse 12/17/2000   a. dx 1980s, most recent echo did not demonstrate this.  Marland Kitchen MYALGIA   . Normal coronary arteries    a. by cardiac CT 2013.  Marland Kitchen PAT (paroxysmal atrial tachycardia) ([place])   . PE (pulmonary embolism) 02/09/2015   a. Bilateral PEs 01/2015 when d-dimer 0.69, CP lying on left side. Followed by heme-onc - + lupus anticoagulant, mildly depressed protein S. Dr. Marin Olp is not certain if her hypercoagulable studies are significant for a thrombophilic state.  . Premature atrial contractions   . Pulmonary embolus ([place]) 02/10/2015  . Shortness of breath    Pulmonary eval 05/2002  . Thyroid nodule 2014  . Tremors of nervous system     Family History  Problem Relation Age of Onset  . Lung cancer Mother   . Hypertension Mother   . Hyperlipidemia Mother   . Cancer Father        Esophageal cancer  . Hyperlipidemia Father   . Depression Father   . Cancer Brother   . Pulmonary embolism Brother   . Colon cancer Paternal Uncle   . Sarcoidosis Other   . Testicular cancer Other     Social History   Socioeconomic History  . Marital status: Married    Spouse name: Not on file  . Number of children: 0  . Years of education: Not on file  . Highest education level: Not on file  Occupational History  . Occupation: Nurse-Personal Care/HH    Employer: UNEMPLOYED  Tobacco Use  . Smoking status: Former Smoker    Packs/day: 1.00    Years: 25.00    Pack years: 25.00    Types: Cigarettes    Quit date: 02/11/1996    Years since quitting: 23.3  . Smokeless tobacco: Never Used  . Tobacco  comment: Married, lives with spouse. 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 Alcohol use: Yes    Comment: occ  . Drug use: No  . Sexual activity: Yes    Partners: Male    Birth control/protection: Post-menopausal  Other Topics Concern  . Not on file  Social History Narrative   Married, lives with spouse in [place]. 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 Difficulty of Paying Living Expenses:   Food Insecurity:   . Worried About Charity fundraiser in the Last Year:   . Arboriculturist in the Last Year:   Transportation Needs:   . Film/video editor (Medical):   Marland Kitchen Lack of Transportation (Non-Medical):   Physical Activity:   . Days of Exercise per Week:   . Minutes of Exercise per Session:   Stress:   . Feeling of Stress :   Social Connections:   . Frequency of Communication with Friends and Family:   . Frequency of Social Gatherings with Friends and Family:   . Attends Religious Services:   . Active Member of Clubs or Organizations:   . Attends Archivist Meetings:   Marland Kitchen Marital  Status:   Intimate Partner Violence:   . Fear of Current or Ex-Partner:   . Emotionally Abused:   Marland Kitchen Physically Abused:   . Sexually Abused:     Past Medical History, Surgical history, Social history, and Family history were reviewed and updated as appropriate.   Please see review of systems for further details on the patient's review from today.   Objective:   Physical Exam:  LMP 05/11/2012   Physical Exam Neurological:     Mental Status: She is alert and oriented to person, place, and time.     Cranial Nerves: No dysarthria.  Psychiatric:        Attention and Perception: Attention normal. She is attentive. She does not perceive auditory or visual hallucinations.        Mood and Affect: Mood is anxious and depressed.         Speech: Speech normal.        Behavior: Behavior is cooperative.        Thought Content: Thought content normal. Thought content is not paranoid or delusional. Thought content does not include homicidal or suicidal ideation. Thought content does not include homicidal or suicidal plan.        Cognition and Memory: Cognition and memory normal.     Comments: Insight is fair. .  Judgment fair     Lab Review:     Component Value Date/Time   NA 138 04/14/2019 1522   NA 142 01/21/2017 1013   NA 143 01/16/2016 1005   K 3.9 04/14/2019 1522   K 3.2 (L) 01/21/2017 1013   K 3.3 (L) 01/16/2016 1005   CL 101 04/14/2019 1522   CL 106 01/21/2017 1013   CO2 26 04/14/2019 1522   CO2 29 01/21/2017 1013   CO2 20 (L) 01/16/2016 1005   GLUCOSE 80 04/14/2019 1522   GLUCOSE 95 01/21/2017 1013   BUN 10 04/14/2019 1522   BUN 9 01/21/2017 1013   BUN 12.6 01/16/2016 1005   CREATININE 0.93 04/14/2019 1522   CREATININE 0.90 12/03/2018 1346   CREATININE 0.9 01/21/2017 1013   CREATININE 0.9 01/16/2016 1005   CALCIUM 9.6 04/14/2019 1522   CALCIUM 9.2 01/21/2017 1013   CALCIUM 9.5 01/16/2016 1005   PROT 7.1 04/14/2019 1522   PROT 6.9 01/21/2017 1013   PROT 7.1 01/16/2016 1005   ALBUMIN 4.3 04/14/2019 1522   ALBUMIN 3.3 01/21/2017 1013   ALBUMIN 3.6 01/16/2016 1005   AST 19 04/14/2019 1522   AST 14 (L) 12/03/2018 1346   AST 16 01/16/2016 1005   ALT 20 04/14/2019 1522   ALT 16 12/03/2018 1346   ALT 22 01/21/2017 1013   ALT 24 01/16/2016 1005   ALKPHOS 62 04/14/2019 1522   ALKPHOS 62 01/21/2017 1013   ALKPHOS 81 01/16/2016 1005   BILITOT 0.4 04/14/2019 1522   BILITOT 0.3 12/03/2018 1346   BILITOT 0.59 01/16/2016 1005   GFRNONAA >60 12/03/2018 1346   GFRAA >60 12/03/2018 1346       Component Value Date/Time   WBC 6.7 04/14/2019 1522   RBC 4.53 04/14/2019 1522   HGB 13.4 04/14/2019 1522   HGB 13.4 12/03/2018 1346   HGB 12.8 01/21/2017 1013   HCT 39.3 04/14/2019 1522   HCT 38.2  01/21/2017 1013   PLT 250.0 04/14/2019 1522   PLT 264 12/03/2018 1346   PLT 176 01/21/2017 1013   MCV 86.9 04/14/2019 1522   MCV 90 01/21/2017 1013   MCH 28.9 12/03/2018 1346  MCHC 34.1 04/14/2019 1522   RDW 15.4 04/14/2019 1522   RDW 14.5 01/21/2017 1013   LYMPHSABS 2.9 04/14/2019 1522   LYMPHSABS 3.6 (H) 01/21/2017 1013   MONOABS 0.5 04/14/2019 1522   EOSABS 0.1 04/14/2019 1522   EOSABS 0.5 01/21/2017 1013   BASOSABS 0.1 04/14/2019 1522   BASOSABS 0.0 01/21/2017 1013    No results found for: POCLITH, LITHIUM   Lab Results  Component Value Date   PHENYTOIN <0.5 ug/mL (L) 11/05/2006   VALPROATE 73.7 11/25/2016     .res Assessment: Plan:    Tijah was seen today for follow-up, anxiety and depression.  Diagnoses and all orders for this visit:  Bipolar disorder with severe depression ([place])  Generalized anxiety disorder  Panic disorder with agoraphobia  Hoarding behavior  Claustrophobia  Benign essential tremor  Fear of injury   Poor sleep hygiene.  Improved with modafinil and motivation is better with it but wants to increase it.  Lifelong history of chronic anhedonic depression and anxiety with very poor functioning continues.  Prognosis is guarded bc multiple med failures and her resistance to change and chronicity of sx and low motivation for change.  She gets some benefit from the current medications a little bit more active and interested and motivated with the addition of modafinil without side effects.  She still is highly dysfunctional..  She is not currently manic.  She is tolerating the medications.  She is had multiple medication failures as noted above.  She is fearful of trying new medications because of a history of suicidal thoughts on Vraylar.    Chronic severe depression and poor function and negative thinking.  Consider olanzapine/fluoxetine combo but she fear of weight gain.  Consider off label Caplyta.  .there are other atypical antipsychotic  mood stabilizers that could be considered but she refuses.  Has gained 15# since Covid.  New cats enjoys them..  Push fluids bc so much  Time in bed and past history of dehydration.  Increase activity.  Work on sleep schedule and try to regulate it for overall mental health benefits.  Keep working on improving activity.  Every little bit of progress can be additive over time.  Discussed the [place] behavioral approaches.  Doesn't drive much.  Modafinil would be likely safer than traditional stimulants which could cause psychotic sx.  Discussed side effects.  She has had none.   Continue other meds.  Wonders about increasing the modafinil.  Benefit 300 mg modafinil.  Still seeing therapist Arville Lime.  This appt was 30 mins.  FU 4 mo  Lynder Parents, MD, DFAPA   Please see After Visit Summary for patient specific instructions.  Future Appointments  Date Time Provider [place]  06/30/2019  2:00 PM Carlethia, [place], [place] [facility]  07/20/2019  3:00 PM Bauert, Nicolasa Ducking, LCSW [facility] None  08/04/2019  3:00 PM [facility] MM 2 GI-BCGMM [facility]BRE[facility]  08/04/2019  3:30 PM [facility] DX DEXA 1 GI-BCGDG [facility]BRE[facility] CE  09/29/2019  2:00 PM Haw[place], [place], MD [facility] None  12/02/2019  2:30 PM [facility] LAB [facility] None  12/02/2019  3:15 PM [place], Holli Humbles, NP [facility] None  01/18/2020  3:00 PM Salvadore Dom, MD [place] None    No orders of the defined types were placed in this encounter.     -------------------------------

## 2019-06-30 ENCOUNTER — Ambulatory Visit: Payer: 59 | Admitting: Family Medicine

## 2019-06-30 ENCOUNTER — Encounter: Payer: Self-pay | Admitting: Family Medicine

## 2019-06-30 ENCOUNTER — Other Ambulatory Visit: Payer: Self-pay

## 2019-06-30 VITALS — BP 116/80 | HR 84 | Temp 97.4°F | Resp 18 | Ht 69.0 in | Wt 173.4 lb

## 2019-06-30 DIAGNOSIS — B351 Tinea unguium: Secondary | ICD-10-CM | POA: Diagnosis not present

## 2019-06-30 DIAGNOSIS — R1011 Right upper quadrant pain: Secondary | ICD-10-CM

## 2019-06-30 MED ORDER — CICLOPIROX 8 % EX SOLN: Freq: Every day | CUTANEOUS | 0 refills | Status: DC

## 2019-06-30 MED ORDER — KETOCONAZOLE 2 % EX CREA: 1.0000 "application " | TOPICAL_CREAM | Freq: Every day | CUTANEOUS | 1 refills | Status: DC

## 2019-06-30 MED FILL — KETOCONAZOLE 2% CREAM: 2 | 15 days supply | Qty: 15 | Fill #0

## 2019-06-30 MED FILL — CICLOPIROX 8% SOLUTION: 8 | 30 days supply | Qty: 7 | Fill #0

## 2019-06-30 NOTE — Patient Instructions (Signed)
Abdominal Pain, Adult Pain in the abdomen (abdominal pain) can be caused by many things. Often, abdominal pain is not serious and it gets better with no treatment or by being treated at home. However, sometimes abdominal pain is serious. Your health care provider will ask questions about your medical history and do a physical exam to try to determine the cause of your abdominal pain. Follow these instructions at home:  Medicines  Take over-the-counter and prescription medicines only as told by your health care provider.  Do not take a laxative unless told by your health care provider. General instructions  Watch your condition for any changes.  Drink enough fluid to keep your urine pale yellow.  Keep all follow-up visits as told by your health care provider. This is important. Contact a health care provider if:  Your abdominal pain changes or gets worse.  You are not hungry or you lose weight without trying.  You are constipated or have diarrhea for more than 2-3 days.  You have pain when you urinate or have a bowel movement.  Your abdominal pain wakes you up at night.  Your pain gets worse with meals, after eating, or with certain foods.  You are vomiting and cannot keep anything down.  You have a fever.  You have blood in your urine. Get help right away if:  Your pain does not go away as soon as your health care provider told you to expect.  You cannot stop vomiting.  Your pain is only in areas of the abdomen, such as the right side or the left lower portion of the abdomen. Pain on the right side could be caused by appendicitis.  You have bloody or black stools, or stools that look like tar.  You have severe pain, cramping, or bloating in your abdomen.  You have signs of dehydration, such as: ? Dark urine, very little urine, or no urine. ? Cracked lips. ? Dry mouth. ? Sunken eyes. ? Sleepiness. ? Weakness.  You have trouble breathing or chest  pain. Summary  Often, abdominal pain is not serious and it gets better with no treatment or by being treated at home. However, sometimes abdominal pain is serious.  Watch your condition for any changes.  Take over-the-counter and prescription medicines only as told by your health care provider.  Contact a health care provider if your abdominal pain changes or gets worse.  Get help right away if you have severe pain, cramping, or bloating in your abdomen. This information is not intended to replace advice given to you by your health care provider. Make sure you discuss any questions you have with your health care provider. Document Revised: 06/07/2018 Document Reviewed: 06/07/2018 Elsevier Patient Education  2020 [employer]  

## 2019-07-01 LAB — CBC WITH DIFFERENTIAL/PLATELET
Basophils Absolute: 0.1 10*3/uL (ref 0.0–0.1)
Basophils Relative: 0.9 % (ref 0.0–3.0)
Eosinophils Absolute: 0.1 10*3/uL (ref 0.0–0.7)
Eosinophils Relative: 1.1 % (ref 0.0–5.0)
HCT: 42.1 % (ref 36.0–46.0)
Hemoglobin: 13.9 g/dL (ref 12.0–15.0)
Lymphocytes Relative: 36.4 % (ref 12.0–46.0)
Lymphs Abs: 3.1 10*3/uL (ref 0.7–4.0)
MCHC: 33.1 g/dL (ref 30.0–36.0)
MCV: 88.5 fl (ref 78.0–100.0)
Monocytes Absolute: 0.6 10*3/uL (ref 0.1–1.0)
Monocytes Relative: 7 % (ref 3.0–12.0)
Neutro Abs: 4.6 10*3/uL (ref 1.4–7.7)
Neutrophils Relative %: 54.6 % (ref 43.0–77.0)
Platelets: 371 10*3/uL (ref 150.0–400.0)
RBC: 4.76 Mil/uL (ref 3.87–5.11)
RDW: 15 % (ref 11.5–15.5)
WBC: 8.4 10*3/uL (ref 4.0–10.5)

## 2019-07-01 LAB — COMPREHENSIVE METABOLIC PANEL
ALT: 18 U/L (ref 0–35)
AST: 15 U/L (ref 0–37)
Albumin: 4.3 g/dL (ref 3.5–5.2)
Alkaline Phosphatase: 71 U/L (ref 39–117)
BUN: 9 mg/dL (ref 6–23)
CO2: 26 mEq/L (ref 19–32)
Calcium: 9.5 mg/dL (ref 8.4–10.5)
Chloride: 104 mEq/L (ref 96–112)
Creatinine, Ser: 0.95 mg/dL (ref 0.40–1.20)
GFR: 60.09 mL/min (ref >60.00)
Glucose, Bld: 106 mg/dL — ABNORMAL HIGH (ref 70–99)
Potassium: 3.9 mEq/L (ref 3.5–5.1)
Sodium: 139 mEq/L (ref 135–145)
Total Bilirubin: 0.2 mg/dL (ref 0.2–1.2)
Total Protein: 7.3 g/dL (ref 6.0–8.3)

## 2019-07-01 LAB — LIPASE: Lipase: 55 U/L (ref 11.0–59.0)

## 2019-07-01 LAB — AMYLASE: Amylase: 41 U/L (ref 27–131)

## 2019-07-04 ENCOUNTER — Ambulatory Visit (HOSPITAL_BASED_OUTPATIENT_CLINIC_OR_DEPARTMENT_OTHER)
Admission: RE | Admit: 2019-07-04 | Discharge: 2019-07-04 | Disposition: A | Discharge status: Home / Self Care | Payer: 59 | Source: Ambulatory Visit | Attending: Family Medicine | Admitting: Family Medicine

## 2019-07-04 ENCOUNTER — Other Ambulatory Visit: Payer: Self-pay

## 2019-07-04 DIAGNOSIS — R1011 Right upper quadrant pain: Secondary | ICD-10-CM | POA: Insufficient documentation

## 2019-07-04 DIAGNOSIS — K7689 Other specified diseases of liver: Secondary | ICD-10-CM | POA: Diagnosis not present

## 2019-07-04 NOTE — Progress Notes (Signed)
Patient ID: Beverly Cole, female    DOB: 12-05-1960  Age: 59 y.o. MRN: [id number]    Subjective:  Subjective  HPI [name] presents for abd pain --- she was sent to er last week but did not want to wait a long time    Pain has improved   Review of Systems  Constitutional: Negative for appetite change, diaphoresis, fatigue and unexpected weight change.  Eyes: Negative for pain, redness and visual disturbance.  Respiratory: Negative for cough, chest tightness, shortness of breath and wheezing.   Cardiovascular: Negative for chest pain, palpitations and leg swelling.  Gastrointestinal: Positive for abdominal pain. Negative for abdominal distention, blood in stool, constipation and diarrhea.  Endocrine: Negative for cold intolerance, heat intolerance, polydipsia, polyphagia and polyuria.  Genitourinary: Negative for difficulty urinating, dysuria and frequency.  Neurological: Negative for dizziness, light-headedness, numbness and headaches.    History Past Medical History:  Diagnosis Date  . Anxiety   . Atrial fibrillation ([place])    a. Event monitor 2013 - PACs/bradycardia/SVT/short run of atrial fib by event monitor.   Marland Kitchen BIPOLAR DISORDER UNSPECIFIED   . Bradycardia   . Bursitis of hip 09/1999   Bilateral - Dr. Berenice Primas  . DEPRESSION   . Emphysema lung ([place]) 06/20/2016   pt states pulmonalongist stated started recently  . GERD   . Headache(784.0)    was with menstrual cycle. no longer a problem  . HYPERLIPIDEMIA   . Hypertension   . Hypertension   . IRRITABLE BOWEL SYNDROME, HX OF   . Menopausal state 01/2012   FSH = 88.5  . Mitral valve prolapse 12/17/2000   a. dx 1980s, most recent echo did not demonstrate this.  Marland Kitchen MYALGIA   . Normal coronary arteries    a. by cardiac CT 2013.  Marland Kitchen PAT (paroxysmal atrial tachycardia) ([place])   . PE (pulmonary embolism) 02/09/2015   a. Bilateral PEs 01/2015 when d-dimer 0.69, CP lying on left side. Followed by heme-onc - + lupus  anticoagulant, mildly depressed protein S. Dr. Marin Olp is not certain if her hypercoagulable studies are significant for a thrombophilic state.  . Premature atrial contractions   . Pulmonary embolus ([place]) 02/10/2015  . Shortness of breath    Pulmonary eval 05/2002  . Thyroid nodule 2014  . Tremors of nervous system     She has a past surgical history that includes Lipoma (R) side (1990); Colonoscopy; and laparoscopic appendectomy (N/A, 03/17/2017).   Her family history includes Cancer in her brother and father; Colon cancer in her paternal uncle; Depression in her father; Hyperlipidemia in her father and mother; Hypertension in her mother; Lung cancer in her mother; Pulmonary embolism in her brother; Sarcoidosis in an other family member; Testicular cancer in an other family member.She reports that she quit smoking about 23 years ago. Her smoking use included cigarettes. She has a 25.00 pack-year smoking history. She has never used smokeless tobacco. She reports current alcohol use. She reports that she does not use drugs.  Current Outpatient Medications on File Prior to Visit  Medication Sig Dispense Refill  . ALPRAZolam (XANAX) 0.25 MG tablet TAKE 1 TO 2 TABLETS BY MOUTH TWICE DAILY AS NEEDED FOR ANXIETY  1  . aspirin EC 325 MG tablet Take 325 mg by mouth daily.    Marland Kitchen azelastine (OPTIVAR) 0.05 % ophthalmic solution Place 1 drop into both eyes 2 (two) times daily. 6 mL 12  . buPROPion (WELLBUTRIN SR) 200 MG 12 hr tablet TAKE 1 TABLET  BY MOUTH DAILY IN THE MORNING 90 tablet 0  . DEXILANT 60 MG capsule Take 1 capsule (60 mg total) by mouth daily. 30 capsule 12  . diltiazem (CARTIA XT) 180 MG 24 hr capsule Take 1 capsule (180 mg total) by mouth daily. 90 capsule 1  . divalproex (DEPAKOTE ER) 500 MG 24 hr tablet TAKE 2 TABLETS BY MOUTH AT BEDTIME. 180 tablet 0  . fluticasone (FLONASE) 50 MCG/ACT nasal spray Place 2 sprays into the nose daily as needed. For allergies    . lidocaine (XYLOCAINE) 5 %  ointment Apply a pea sized amount topically 20-30 minutes prior to intercourse, wipe off just prior to intercourse. 30 g 1  . modafinil (PROVIGIL) 200 MG tablet Take 1.5 tablets (300 mg total) by mouth daily. 135 tablet 1  . NONFORMULARY OR COMPOUNDED ITEM Azelaic acid; metronidazole / ivermectin cream  Bid    . NONFORMULARY OR COMPOUNDED ITEM Walker  #1  ---  Type per pt choice   Dx hx of falling 1 each 0  . NONFORMULARY OR COMPOUNDED ITEM sm Azelaic acid/ metronidazole/ivermectin 15%/1%/1% cream apply to face bid 1 each 5  . Probiotic Product (PROBIOTIC DAILY PO) Take 1 tablet by mouth daily.     . propranolol (INDERAL) 20 MG tablet TAKE 1 TABLET BY MOUTH 2 TIMES DAILY. 180 tablet 0  . sertraline (ZOLOFT) 50 MG tablet TAKE 1 TABLET BY MOUTH DAILY. 90 tablet 0  . tretinoin (RETIN-A) 0.025 % cream as needed.   4  . trimethoprim (TRIMPEX) 100 MG tablet After intercourse 30 tablet 1   No current facility-administered medications on file prior to visit.     Objective:  Objective  Physical Exam Vitals and nursing note reviewed.  Constitutional:      Appearance: She is well-developed.  HENT:     Head: Normocephalic and atraumatic.  Eyes:     Conjunctiva/sclera: Conjunctivae normal.  Neck:     Thyroid: No thyromegaly.     Vascular: No carotid bruit or JVD.  Cardiovascular:     Rate and Rhythm: Normal rate and regular rhythm.     Heart sounds: Normal heart sounds. No murmur.  Pulmonary:     Effort: Pulmonary effort is normal. No respiratory distress.     Breath sounds: Normal breath sounds. No wheezing or rales.  Chest:     Chest wall: No tenderness.  Abdominal:     General: Bowel sounds are normal.     Palpations: Abdomen is soft.     Tenderness: There is abdominal tenderness in the right upper quadrant. There is no guarding or rebound. Negative signs include Rovsing's sign, McBurney's sign and psoas sign.     Hernia: A hernia is present.  Musculoskeletal:     Cervical back: Normal  range of motion and neck supple.  Neurological:     Mental Status: She is alert and oriented to person, place, and time.    BP 116/80 (BP Location: Right Arm, Patient Position: Sitting, Cuff Size: Normal)   Pulse 84   Temp (!) 97.4 F (36.3 C) (Temporal)   Resp 18   Ht 5\' 9"  (1.753 m)   Wt 173 lb 6.4 oz (78.7 kg)   LMP 05/11/2012   SpO2 96%   BMI 25.61 kg/m  Wt Readings from Last 3 Encounters:  06/30/19 173 lb 6.4 oz (78.7 kg)  06/24/19 176 lb 8 oz (80.1 kg)  04/14/19 175 lb 9.6 oz (79.7 kg)     Lab Results  Component  Value Date   WBC 8.4 06/30/2019   HGB 13.9 06/30/2019   HCT 42.1 06/30/2019   PLT 371.0 06/30/2019   GLUCOSE 106 (H) 06/30/2019   CHOL 321 (H) 04/14/2019   TRIG 359.0 (H) 04/14/2019   HDL 43.00 04/14/2019   LDLDIRECT 227.0 04/14/2019   LDLCALC 154 (H) 11/26/2017   ALT 18 06/30/2019   AST 15 06/30/2019   NA 139 06/30/2019   K 3.9 06/30/2019   CL 104 06/30/2019   CREATININE 0.95 06/30/2019   BUN 9 06/30/2019   CO2 26 06/30/2019   TSH 3.23 04/14/2019   INR 1.10 02/09/2015   HGBA1C 5.3 03/20/2009   MICROALBUR 0.6 05/19/2011    No results found.   Assessment & Plan:  Plan  I have discontinued Calliana K. Gaddie's rosuvastatin. I am also having her start on ketoconazole. Additionally, I am having her maintain her fluticasone, Probiotic Product (PROBIOTIC DAILY PO), ALPRAZolam, aspirin EC, tretinoin, NONFORMULARY OR COMPOUNDED ITEM, NONFORMULARY OR COMPOUNDED ITEM, lidocaine, trimethoprim, modafinil, NONFORMULARY OR COMPOUNDED ITEM, Dexilant, azelastine, divalproex, propranolol, sertraline, buPROPion, diltiazem, and ciclopirox.  Meds ordered this encounter  Medications  . ciclopirox (PENLAC) 8 % solution    Sig: Apply topically at bedtime. Apply over nail and surrounding skin. Apply daily over previous coat. After seven (7) days, may remove with alcohol and continue cycle.    Dispense:  6.6 mL    Refill:  0  . ketoconazole (NIZORAL) 2 % cream    Sig:  Apply 1 application topically daily.    Dispense:  15 g    Refill:  1    Problem List Items Addressed This Visit      Unprioritized   RUQ pain - Primary    [place] abd  Check labs  If pain worsening -- go to ER      Relevant Orders   CBC with Differential/Platelet (Completed)   Comprehensive metabolic panel (Completed)   Amylase (Completed)   Lipase (Completed)   US Abdomen Limited RUQ   POCT Urinalysis Dipstick (Automated)    Other Visit Diagnoses    Onychomycosis       Relevant Medications   ciclopirox (PENLAC) 8 % solution   ketoconazole (NIZORAL) 2 % cream      Follow-up: No follow-ups on file.  Ann Held, DO

## 2019-07-04 NOTE — Assessment & Plan Note (Signed)
[place] abd  Check labs  If pain worsening -- go to ER

## 2019-07-05 ENCOUNTER — Other Ambulatory Visit: Payer: Self-pay | Admitting: Family Medicine

## 2019-07-05 ENCOUNTER — Encounter: Payer: Self-pay | Admitting: Family Medicine

## 2019-07-05 DIAGNOSIS — K769 Liver disease, unspecified: Secondary | ICD-10-CM

## 2019-07-14 ENCOUNTER — Encounter: Payer: Self-pay | Admitting: Family Medicine

## 2019-07-15 ENCOUNTER — Other Ambulatory Visit: Payer: Self-pay | Admitting: Family Medicine

## 2019-07-15 ENCOUNTER — Encounter: Payer: Self-pay | Admitting: Family Medicine

## 2019-07-15 DIAGNOSIS — F4000 Agoraphobia, unspecified: Secondary | ICD-10-CM

## 2019-07-15 MED ORDER — ALPRAZOLAM 0.5 MG PO TABS: ORAL_TABLET | ORAL | 0 refills | Status: DC

## 2019-07-15 NOTE — Telephone Encounter (Signed)
Xanax sent in --- take 30 min prior to procecdure and take bottle with her in case she needs more

## 2019-07-20 ENCOUNTER — Ambulatory Visit (INDEPENDENT_AMBULATORY_CARE_PROVIDER_SITE_OTHER): Payer: 59 | Admitting: Psychology

## 2019-07-20 DIAGNOSIS — F331 Major depressive disorder, recurrent, moderate: Secondary | ICD-10-CM | POA: Diagnosis not present

## 2019-07-21 ENCOUNTER — Ambulatory Visit (HOSPITAL_COMMUNITY)
Admission: RE | Admit: 2019-07-21 | Discharge: 2019-07-21 | Disposition: A | Discharge status: Home / Self Care | Payer: 59 | Source: Ambulatory Visit | Attending: Family Medicine | Admitting: Family Medicine

## 2019-07-21 ENCOUNTER — Other Ambulatory Visit: Payer: Self-pay

## 2019-07-21 DIAGNOSIS — K769 Liver disease, unspecified: Secondary | ICD-10-CM | POA: Insufficient documentation

## 2019-07-21 DIAGNOSIS — K760 Fatty (change of) liver, not elsewhere classified: Secondary | ICD-10-CM | POA: Diagnosis not present

## 2019-07-21 MED ORDER — GADOBUTROL 1 MMOL/ML IV SOLN
8.0000 mL | Freq: Once | INTRAVENOUS | Status: AC | PRN
  Administered 2019-07-21: 8 mL via INTRAVENOUS

## 2019-07-22 ENCOUNTER — Encounter: Payer: Self-pay | Admitting: Family Medicine

## 2019-07-22 NOTE — Telephone Encounter (Signed)
See mri

## 2019-07-22 NOTE — Telephone Encounter (Signed)
Please advise 

## 2019-07-25 MED FILL — DILTIAZEM HCL ER COATED BEA: 180 | 90 days supply | Qty: 90 | Fill #0

## 2019-07-25 MED FILL — DEXILANT DR 60 MG CAPSULE: 60 | 30 days supply | Qty: 30 | Fill #3

## 2019-08-04 ENCOUNTER — Other Ambulatory Visit: Payer: Self-pay

## 2019-08-04 ENCOUNTER — Ambulatory Visit
Admission: RE | Admit: 2019-08-04 | Discharge: 2019-08-04 | Disposition: A | Discharge status: Home / Self Care | Payer: 59 | Source: Ambulatory Visit | Attending: Obstetrics and Gynecology | Admitting: Obstetrics and Gynecology

## 2019-08-04 ENCOUNTER — Ambulatory Visit
Admission: RE | Admit: 2019-08-04 | Discharge: 2019-08-04 | Disposition: A | Discharge status: Home / Self Care | Payer: 59 | Source: Ambulatory Visit | Attending: Family Medicine | Admitting: Family Medicine

## 2019-08-04 DIAGNOSIS — Z1231 Encounter for screening mammogram for malignant neoplasm of breast: Secondary | ICD-10-CM

## 2019-08-04 DIAGNOSIS — E2839 Other primary ovarian failure: Secondary | ICD-10-CM

## 2019-08-04 DIAGNOSIS — Z780 Asymptomatic menopausal state: Secondary | ICD-10-CM | POA: Diagnosis not present

## 2019-08-04 DIAGNOSIS — M85852 Other specified disorders of bone density and structure, left thigh: Secondary | ICD-10-CM | POA: Diagnosis not present

## 2019-08-08 ENCOUNTER — Encounter: Payer: Self-pay | Admitting: Dermatology

## 2019-08-09 NOTE — Telephone Encounter (Signed)
I will take care of it. I will have Abby reach out to her today with a phone call.

## 2019-08-17 ENCOUNTER — Ambulatory Visit (INDEPENDENT_AMBULATORY_CARE_PROVIDER_SITE_OTHER): Payer: 59 | Admitting: Psychology

## 2019-08-17 DIAGNOSIS — F331 Major depressive disorder, recurrent, moderate: Secondary | ICD-10-CM

## 2019-08-29 ENCOUNTER — Other Ambulatory Visit: Payer: Self-pay | Admitting: Psychiatry

## 2019-08-29 MED FILL — DEXILANT DR 60 MG CAPSULE: 60 | 30 days supply | Qty: 30 | Fill #4

## 2019-08-30 MED FILL — SERTRALINE HCL 50 MG TABLET: 50 | 90 days supply | Qty: 90 | Fill #0

## 2019-08-30 MED FILL — DIVALPROEX SOD ER 500 MG TA: 500 | 90 days supply | Qty: 180 | Fill #0

## 2019-08-30 MED FILL — PROPRANOLOL 20 MG TABLET: 20 | 90 days supply | Qty: 180 | Fill #0

## 2019-09-01 MED FILL — TRIMETHOPRIM 100 MG TABLET: 100 | 30 days supply | Qty: 30 | Fill #1

## 2019-09-14 ENCOUNTER — Ambulatory Visit (INDEPENDENT_AMBULATORY_CARE_PROVIDER_SITE_OTHER): Payer: 59 | Admitting: Psychology

## 2019-09-14 DIAGNOSIS — F331 Major depressive disorder, recurrent, moderate: Secondary | ICD-10-CM

## 2019-09-19 ENCOUNTER — Other Ambulatory Visit: Payer: Self-pay | Admitting: Psychiatry

## 2019-09-19 DIAGNOSIS — F314 Bipolar disorder, current episode depressed, severe, without psychotic features: Secondary | ICD-10-CM

## 2019-09-20 MED FILL — BUPROPION HCL SR 200 MG TAB: 200 | 90 days supply | Qty: 90 | Fill #0

## 2019-09-20 NOTE — Telephone Encounter (Signed)
Please send, both 90 day supply. Has upcoming apt 08/31

## 2019-09-22 MED FILL — DEXILANT DR 60 MG CAPSULE: 60 | 30 days supply | Qty: 30 | Fill #5

## 2019-09-26 MED FILL — MODAFINIL 200 MG TABLET: 200 | 90 days supply | Qty: 135 | Fill #0

## 2019-09-29 ENCOUNTER — Encounter: Payer: Self-pay | Admitting: Dermatology

## 2019-10-06 ENCOUNTER — Other Ambulatory Visit: Payer: Self-pay | Admitting: Family Medicine

## 2019-10-06 DIAGNOSIS — B351 Tinea unguium: Secondary | ICD-10-CM

## 2019-10-06 MED ORDER — CICLOPIROX 8 % EX SOLN: Freq: Every day | CUTANEOUS | 0 refills | Status: DC

## 2019-10-07 MED FILL — CICLOPIROX 8 % SOLN: 8 | 20 days supply | Qty: 7 | Fill #0

## 2019-10-11 ENCOUNTER — Encounter: Payer: Self-pay | Admitting: Psychiatry

## 2019-10-11 ENCOUNTER — Other Ambulatory Visit: Payer: Self-pay

## 2019-10-11 ENCOUNTER — Ambulatory Visit (INDEPENDENT_AMBULATORY_CARE_PROVIDER_SITE_OTHER): Payer: 59 | Admitting: Psychiatry

## 2019-10-11 DIAGNOSIS — F423 Hoarding disorder: Secondary | ICD-10-CM | POA: Diagnosis not present

## 2019-10-11 DIAGNOSIS — F40240 Claustrophobia: Secondary | ICD-10-CM | POA: Diagnosis not present

## 2019-10-11 DIAGNOSIS — F314 Bipolar disorder, current episode depressed, severe, without psychotic features: Secondary | ICD-10-CM | POA: Diagnosis not present

## 2019-10-11 DIAGNOSIS — F4001 Agoraphobia with panic disorder: Secondary | ICD-10-CM

## 2019-10-11 DIAGNOSIS — G250 Essential tremor: Secondary | ICD-10-CM

## 2019-10-11 DIAGNOSIS — F411 Generalized anxiety disorder: Secondary | ICD-10-CM | POA: Diagnosis not present

## 2019-10-11 MED ORDER — MODAFINIL 200 MG PO TABS: 200.0000 mg | ORAL_TABLET | Freq: Two times a day (BID) | ORAL | 0 refills | Status: DC

## 2019-10-11 NOTE — Progress Notes (Signed)
Beverly Cole [id number] March 14, 1960 59 y.o.   Subjective:   Patient ID:  OTHA RICKLES is a 59 y.o. (DOB Jun 29, 1960) female.  Chief Complaint:  Chief Complaint  Patient presents with  . Follow-up  . Depression  . Anxiety  . Stress    health    Depression        Associated symptoms include no decreased concentration and no suicidal ideas.  Past medical history includes anxiety.   Anxiety Symptoms include dizziness, nervous/anxious behavior and shortness of breath. Patient reports no confusion, decreased concentration or suicidal ideas.     [name]  today for follow-up of chronic depression and anxiety.  Lately depression worse than anxiety.  Seen with husband today  When seen May 11, 2018.  For persistent anxiety we elected to retry buspirone and try increasing it to 30 mg twice daily if tolerated.   Couldn't tolerate it this time DT muscle spasms.  Pharmacy called saying she could have serotonin syndrome.  When seen August 09, 2018 and she refused med changes despite chronic anxiety and depression. She remained on sertraline 50, Depakote ER 1000 mg, Wellbutrin SR 200 mg AM  Panic wearing cloth masks.  Using a shield.  Not in the office today bc H exposed to Covid.    seen December 09, 2018.  Because of chronic depression and dysfunction with fatigue and poor motivation we decided off label trial  trial of modafinil 200 mg 1/2 each am for 1 week then 200 mg each AM. Did see benefit from it.  More energy and working to stay out of bed more.    seen January 10, 2019.  The modafinil had been helpful.  No meds were changed.  I seen April 07, 2019.  The following was noted: Still sees benefit from modafinil but still not caring for herself like she should.  Not wanting to go anywhere.  Not driven since early fall.  Hard to breathe with the mask and it creates anxiety.  Hard to be in enclosed spaces like cars and planes for extended periods.   Trying to set her alarm  clock.  No hallucinations.  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  Plans to send more too.  Still follow through is not great.  Still depression and productivity is still poor but it is not as poor as it was before modafinil. Plan increase modafinil to 300 mg daily to see if function can be improved.  06/29/2019 appointment, the following is noted: Rare Xanax.  Did increase modafinil to 300 mg daily. Saw benefit with the increase with less time in bed but still markedly functionally impaired.   It seems like I get used to it and then doesn't maintain her get up and go.  Still having a hard time with self care like hygiene. Chronic low motivation and lack of interest is unchanged.   Gall bladder problems.  It's better at the moment.    Not more sad, just like I've always been.  Can't make herself leave the house DT depression and anxiety.  She won't do chores.   Less excess sleeping.  He works from home 2 days weekly.  No periods of hyperactivity or manic sx since the spring. Attending therapy every 2 weeks.   Can be confused when first wakes up regardless of time of day. Had episode of hallucination in the middle of the night when  awakened. Scarecrow frightened her. This is rare event.  No hallucinations during the day.  Melatonin makes it worse. Has had fearful thoughts of planes crashing or bad things happening to others and it might be her fault.  Fight with brother lately and not speaking with him now. Plan:  No med changes  10/11/19 appt with the following noted: Unsteady and tremors gotten worse.  Not gone to doctor. Propranolol not helping as much.   On Depakote ER 1000mg  HS which is a reduction. So tired all the time and H says she's not doing anything.  H says accomplishing littte and still lays in bed a lot.  H says it's 2-3 PM before she gets OOB.  Memory is poor.  Everything is such a chore and doesn't want to do  things. H says she starts things she doesn't finish.  Lots of new projects.   Increase modafinil on her own to 400 mg daily in the AM.  Anxious.  Rare Xanax.   Was happy when got new cats in the summer.  Anxious and Depressed and describes anxiety as Moderate. Anxiety symptoms include: Excessive Worry,.  She thinks sertraline reduces her obsessiveness.  Past hx panic so avoidant. Never get to relax.  Pt reports sleeps excessively even with modafinil. Pt reports that appetite is good. Pt reports that energy is poor and loss of interest or pleasure in usual activities, poor motivation and withdrawn from usual activities. Just don't care about things and admits to a lot of general anxiety and everything takes a lot of energy out of her.  Compulsively tapes and watches TV shows, news and awards shows.  Can't delete it bc I'll miss something.  Watches TV in bed. Concentration is poor. Suicidal thoughts:  denied by patient. But chronic death thoughts.  Poor productivity overall.  Chronic poor self care, showers only weekly.  Just don't care.  Chronic disability.  She thinks sertraline helped the anxiety some.  Likes that H is at home.  Helps her mood.  Doesn't care about going out.  Feels faint when she tries to wear a mask.  Too anxious driving and is avoidant.  Collects TV shows, she can't erase unless she watches entirely. Hoards some bc afraid she might need it some day.    Rare use of Xanax bc fears addiction.  Afraid to try new meds after negative reaction to Vraylar.  Afraid of any med change.  Afraid of having SI and afraid she'd do it if had SI. Hopeless about getting better.  Saw pulmonologist over weakness and he says she's OK except deconditioned.  Can't tolerate wearing the masks.  Past psych med trials: Wellbutrin XL 300,  Sertraline  ECT 2016 without help,  Vraylar SI,  pramipexole,  Seroquel which caused side effects of constipation,  Abilify ,  lamotrigine,  Latuda,  refuses  lithium because of altered taste.   She has a history of ataxia on 1500 mg of Depakote.   Recent buspirone SE hallucinations  Review of Systems:  Review of Systems  Respiratory: Positive for shortness of breath.   Gastrointestinal: Positive for abdominal pain.  Neurological: Positive for dizziness and tremors. Negative for weakness.  Psychiatric/Behavioral: Positive for depression, dysphoric mood and sleep disturbance. Negative for agitation, behavioral problems, confusion, decreased concentration, hallucinations, self-injury and suicidal ideas. The patient is nervous/anxious. The patient is not hyperactive.     Medications: I have reviewed the patient's current medications.  Current Outpatient Medications  Medication Sig Dispense Refill  .  aspirin EC 325 MG tablet Take 325 mg by mouth daily.    Marland Kitchen azelastine (OPTIVAR) 0.05 % ophthalmic solution Place 1 drop into both eyes 2 (two) times daily. 6 mL 12  . buPROPion (WELLBUTRIN SR) 200 MG 12 hr tablet TAKE 1 TABLET BY MOUTH DAILY IN THE MORNING 90 tablet 0  . ciclopirox (PENLAC) 8 % solution Apply topically at bedtime. Apply over nail and surrounding skin. Apply daily over previous coat. After seven (7) days, may remove with alcohol and continue cycle. 6.6 mL 0  . DEXILANT 60 MG capsule Take 1 capsule (60 mg total) by mouth daily. 30 capsule 12  . diltiazem (CARTIA XT) 180 MG 24 hr capsule Take 1 capsule (180 mg total) by mouth daily. 90 capsule 1  . divalproex (DEPAKOTE ER) 500 MG 24 hr tablet TAKE 2 TABLETS BY MOUTH AT BEDTIME. 180 tablet 0  . fluticasone (FLONASE) 50 MCG/ACT nasal spray Place 2 sprays into the nose daily as needed. For allergies    . ketoconazole (NIZORAL) 2 % cream Apply 1 application topically daily. 15 g 1  . lidocaine (XYLOCAINE) 5 % ointment Apply a pea sized amount topically 20-30 minutes prior to intercourse, wipe off just prior to intercourse. 30 g 1  . modafinil (PROVIGIL) 200 MG tablet Take 1 tablet (200 mg total)  by mouth in the morning and at bedtime. Increased on her own. 180 tablet 0  . NONFORMULARY OR COMPOUNDED ITEM Azelaic acid; metronidazole / ivermectin cream  Bid    . NONFORMULARY OR COMPOUNDED ITEM Walker  #1  ---  Type per pt choice   Dx hx of falling 1 each 0  . NONFORMULARY OR COMPOUNDED ITEM sm Azelaic acid/ metronidazole/ivermectin 15%/1%/1% cream apply to face bid 1 each 5  . Probiotic Product (PROBIOTIC DAILY PO) Take 1 tablet by mouth daily.     . propranolol (INDERAL) 20 MG tablet TAKE 1 TABLET BY MOUTH 2 TIMES DAILY. 180 tablet 0  . sertraline (ZOLOFT) 50 MG tablet TAKE 1 TABLET BY MOUTH DAILY. 90 tablet 0  . tretinoin (RETIN-A) 0.025 % cream as needed.   4  . trimethoprim (TRIMPEX) 100 MG tablet After intercourse 30 tablet 1  . ALPRAZolam (XANAX) 0.5 MG tablet 1 po 30 min prior to procedure 10 tablet 0   No current facility-administered medications for this visit.    Medication Side Effects: None  Allergies:  Allergies  Allergen Reactions  . Cariprazine Other (See Comments)    Patient becomes suicidal when taking this medication.  Patient becomes suicidal when taking this medication.   . Metoprolol Other (See Comments)    Hallucinations hair falls out Hallucinations hair falls out  . Nickel Rash    Past Medical History:  Diagnosis Date  . Anxiety   . Atrial fibrillation ([place])    a. Event monitor 2013 - PACs/bradycardia/SVT/short run of atrial fib by event monitor.   Marland Kitchen BIPOLAR DISORDER UNSPECIFIED   . Bradycardia   . Bursitis of hip 09/1999   Bilateral - Dr. Berenice Primas  . DEPRESSION   . Emphysema lung ([place]) 06/20/2016   pt states pulmonalongist stated started recently  . GERD   . Headache(784.0)    was with menstrual cycle. no longer a problem  . HYPERLIPIDEMIA   . Hypertension   . Hypertension   . IRRITABLE BOWEL SYNDROME, HX OF   . Menopausal state 01/2012   FSH = 88.5  . Mitral valve prolapse 12/17/2000   a. dx 1980s, most recent  echo did not demonstrate  this.  Marland Kitchen MYALGIA   . Normal coronary arteries    a. by cardiac CT 2013.  Marland Kitchen PAT (paroxysmal atrial tachycardia) ([place])   . PE (pulmonary embolism) 02/09/2015   a. Bilateral PEs 01/2015 when d-dimer 0.69, CP lying on left side. Followed by heme-onc - + lupus anticoagulant, mildly depressed protein S. Dr. Marin Olp is not certain if her hypercoagulable studies are significant for a thrombophilic state.  . Premature atrial contractions   . Pulmonary embolus ([place]) 02/10/2015  . Shortness of breath    Pulmonary eval 05/2002  . Thyroid nodule 2014  . Tremors of nervous system     Family History  Problem Relation Age of Onset  . Lung cancer Mother   . Hypertension Mother   . Hyperlipidemia Mother   . Cancer Father        Esophageal cancer  . Hyperlipidemia Father   . Depression Father   . Cancer Brother   . Pulmonary embolism Brother   . Colon cancer Paternal Uncle   . Sarcoidosis Other   . Testicular cancer Other     Social History   Socioeconomic History  . Marital status: Married    Spouse name: Not on file  . Number of children: 0  . Years of education: Not on file  . Highest education level: Not on file  Occupational History  . Occupation: Nurse-Personal Care/HH    Employer: UNEMPLOYED  Tobacco Use  . Smoking status: Former Smoker    Packs/day: 1.00    Years: 25.00    Pack years: 25.00    Types: Cigarettes    Quit date: 02/11/1996    Years since quitting: 23.6  . Smokeless tobacco: Never Used  . Tobacco comment: Married, lives with spouse. 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 Vaping Use: Never used  Substance and Sexual Activity  . Alcohol use: Yes    Comment: occ  . Drug use: No  . Sexual activity: Yes    Partners: Male    Birth control/protection: Post-menopausal  Other Topics Concern  . Not on file  Social History Narrative   Married, lives with spouse in [place]. 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 Difficulty of Paying Living Expenses: Not on file  Food Insecurity:   . Worried About Charity fundraiser in the Last Year: Not on file  . Ran Out of Food in the Last Year: Not on file  Transportation Needs:   . Lack of Transportation (Medical): Not on file  . Lack of Transportation (Non-Medical): Not on file  Physical Activity:   . Days of Exercise per Week: Not on file  . Minutes of Exercise per Session: Not on file  Stress:   . Feeling of Stress : Not on file  Social Connections:   . Frequency of Communication with Friends and Family: Not on file  . Frequency of Social Gatherings with Friends and Family: Not on file  . Attends Religious Services: Not on file  . Active Member of Clubs or Organizations: Not on file  . Attends Archivist Meetings: Not on file  . Marital Status: Not on file  Intimate Partner Violence:   . Fear of Current or Ex-Partner: Not on file  . Emotionally Abused:  Not on file  . Physically Abused: Not on file  . Sexually Abused: Not on file    Past Medical History, Surgical history, Social history, and Family history were reviewed and updated as appropriate.   Please see review of systems for further details on the patient's review from today.   Objective:   Physical Exam:  LMP 05/11/2012   Physical Exam Constitutional:      General: She is not in acute distress. Musculoskeletal:        General: No deformity.  Neurological:     Mental Status: She is alert and oriented to person, place, and time.     Cranial Nerves: No dysarthria.     Coordination: Coordination abnormal.     Gait: Gait abnormal.     Comments: Cane  Psychiatric:        Attention and Perception: Attention and perception normal. She is attentive. She does not perceive auditory or visual hallucinations.        Mood and Affect: Mood is anxious and depressed. Affect is not labile,  blunt, angry or inappropriate.        Speech: Speech normal.        Behavior: Behavior normal. Behavior is cooperative.        Thought Content: Thought content normal. Thought content is not paranoid or delusional. Thought content does not include homicidal or suicidal ideation. Thought content does not include homicidal or suicidal plan.        Cognition and Memory: Cognition and memory normal.        Judgment: Judgment normal.     Comments: Insight is fair. .  Judgment fair     Lab Review:     Component Value Date/Time   NA 139 06/30/2019 1431   NA 142 01/21/2017 1013   NA 143 01/16/2016 1005   K 3.9 06/30/2019 1431   K 3.2 (L) 01/21/2017 1013   K 3.3 (L) 01/16/2016 1005   CL 104 06/30/2019 1431   CL 106 01/21/2017 1013   CO2 26 06/30/2019 1431   CO2 29 01/21/2017 1013   CO2 20 (L) 01/16/2016 1005   GLUCOSE 106 (H) 06/30/2019 1431   GLUCOSE 95 01/21/2017 1013   BUN 9 06/30/2019 1431   BUN 9 01/21/2017 1013   BUN 12.6 01/16/2016 1005   CREATININE 0.95 06/30/2019 1431   CREATININE 0.90 12/03/2018 1346   CREATININE 0.9 01/21/2017 1013   CREATININE 0.9 01/16/2016 1005   CALCIUM 9.5 06/30/2019 1431   CALCIUM 9.2 01/21/2017 1013   CALCIUM 9.5 01/16/2016 1005   PROT 7.3 06/30/2019 1431   PROT 6.9 01/21/2017 1013   PROT 7.1 01/16/2016 1005   ALBUMIN 4.3 06/30/2019 1431   ALBUMIN 3.3 01/21/2017 1013   ALBUMIN 3.6 01/16/2016 1005   AST 15 06/30/2019 1431   AST 14 (L) 12/03/2018 1346   AST 16 01/16/2016 1005   ALT 18 06/30/2019 1431   ALT 16 12/03/2018 1346   ALT 22 01/21/2017 1013   ALT 24 01/16/2016 1005   ALKPHOS 71 06/30/2019 1431   ALKPHOS 62 01/21/2017 1013   ALKPHOS 81 01/16/2016 1005   BILITOT 0.2 06/30/2019 1431   BILITOT 0.3 12/03/2018 1346   BILITOT 0.59 01/16/2016 1005   GFRNONAA >60 12/03/2018 1346   GFRAA >60 12/03/2018 1346       Component Value Date/Time   WBC 8.4 06/30/2019 1431   RBC 4.76 06/30/2019 1431   HGB 13.9 06/30/2019 1431   HGB 13.4  12/03/2018 1346  HGB 12.8 01/21/2017 1013   HCT 42.1 06/30/2019 1431   HCT 38.2 01/21/2017 1013   PLT 371.0 06/30/2019 1431   PLT 264 12/03/2018 1346   PLT 176 01/21/2017 1013   MCV 88.5 06/30/2019 1431   MCV 90 01/21/2017 1013   MCH 28.9 12/03/2018 1346   MCHC 33.1 06/30/2019 1431   RDW 15.0 06/30/2019 1431   RDW 14.5 01/21/2017 1013   LYMPHSABS 3.1 06/30/2019 1431   LYMPHSABS 3.6 (H) 01/21/2017 1013   MONOABS 0.6 06/30/2019 1431   EOSABS 0.1 06/30/2019 1431   EOSABS 0.5 01/21/2017 1013   BASOSABS 0.1 06/30/2019 1431   BASOSABS 0.0 01/21/2017 1013    No results found for: POCLITH, LITHIUM   Lab Results  Component Value Date   PHENYTOIN <0.5 ug/mL (L) 11/05/2006   VALPROATE 73.7 11/25/2016     .res Assessment: Plan:    Kelsay was seen today for follow-up, depression, anxiety and stress.  Diagnoses and all orders for this visit:  Bipolar disorder with severe depression ([place]) -     modafinil (PROVIGIL) 200 MG tablet; Take 1 tablet (200 mg total) by mouth in the morning and at bedtime. Increased on her own.  Generalized anxiety disorder  Panic disorder with agoraphobia  Benign essential tremor  Hoarding behavior  Claustrophobia   Poor sleep hygiene.  Improved with modafinil and motivation is better with it but wants to increase it.  TRD.  Lifelong history of chronic anhedonic depression and anxiety with very poor functioning continues.  Prognosis is guarded bc multiple med failures and her resistance to change and chronicity of sx and low motivation for change.  She gets some benefit from the current medications a little bit more active and interested and motivated with the addition of modafinil without side effects.  She still is highly dysfunctional..  She is not currently manic.  She is tolerating the medications.  She is had multiple medication failures as noted above.  She is fearful of trying new medications because of a history of suicidal thoughts on  Vraylar.   Failed all FDA approved bipolar depression meds.  Chronic severe depression and poor function and negative thinking.  Consider olanzapine/fluoxetine combo but she fear of weight gain.  Consider off label Caplyta.  .there are other atypical antipsychotic mood stabilizers that could be considered but she refuses.  Has gained 15# since Covid.  New cats enjoys them..  Push fluids bc so much  Time in bed and past history of dehydration. Disc risk propranolol but it helps tremor.  Increase activity.  Work on sleep schedule and try to regulate it for overall mental health benefits.  Keep working on improving activity.  Every little bit of progress can be additive over time.  Discussed the Cognitive behavioral approaches.  Doesn't drive much.  Modafinil would be likely safer than traditional stimulants which could cause psychotic sx.  Discussed side effects.  She has had none.   Continue other meds. Dont' increase further, Benefit 400 mg modafinil.  Rec neurologist bc worsening balance problems and tremor. After the evaluation consider using selegiline in place of Wellbutrin and sertraline bc of TRD  Still seeing therapist Arville Lime.  This appt was 30 mins.  FU 4 mo  Lynder Parents, MD, DFAPA   Please see After Visit Summary for patient specific instructions.  Future Appointments  Date Time Provider [place]  10/12/2019  3:00 PM Barrie Folk, LCSW [facility] None  10/28/2019  2:00 PM Burnell Blanks, MD  [facility]  12/02/2019  2:30 PM [facility] None  12/02/2019  3:15 PM [place], Holli Humbles, NP [facility] None  12/07/2019  3:15 PM Laurence Ferrari, [place], MD [facility] None  01/18/2020  3:00 PM Salvadore Dom, MD [place] None    No orders of the defined types were placed in this encounter.     -------------------------------

## 2019-10-12 ENCOUNTER — Telehealth: Payer: Self-pay | Admitting: Psychiatry

## 2019-10-12 ENCOUNTER — Ambulatory Visit (INDEPENDENT_AMBULATORY_CARE_PROVIDER_SITE_OTHER): Payer: 59 | Admitting: Psychology

## 2019-10-12 DIAGNOSIS — F331 Major depressive disorder, recurrent, moderate: Secondary | ICD-10-CM

## 2019-10-12 NOTE — Telephone Encounter (Signed)
I LM for Beverly Cole explaining that Dr. Carles Collet would not able to see her.  Dr. Clovis Pu recommended that she see Dr. Floyde Parkins again or if you wish you can choose another neurologist.  If she had further questions, call the office.

## 2019-10-12 NOTE — Telephone Encounter (Signed)
Dr. Doristine Devoid office called and said that they received our referral but they say that there is nothing else. Dr. Carles Collet can do. So they declined the referral.

## 2019-10-12 NOTE — Telephone Encounter (Signed)
Inform pt of this info and she'll probably need to go back to Dr. Floyde Parkins or she can pick another neurologist.

## 2019-10-14 NOTE — Telephone Encounter (Signed)
Beverly Cole called and said tha t she would like to see Dr. Jannifer Franklin and would like the referral to be sent

## 2019-10-14 NOTE — Telephone Encounter (Signed)
refer

## 2019-10-24 MED FILL — DEXILANT DR 60 MG CAPSULE: 60 | 30 days supply | Qty: 30 | Fill #6

## 2019-10-24 MED FILL — KETOCONAZOLE 2% CREAM: 2 | 15 days supply | Qty: 15 | Fill #1

## 2019-10-24 MED FILL — CARTIA XT 180 MG CAPSULE SA: 180 | 90 days supply | Qty: 90 | Fill #1

## 2019-10-28 ENCOUNTER — Ambulatory Visit (INDEPENDENT_AMBULATORY_CARE_PROVIDER_SITE_OTHER): Payer: 59 | Admitting: Cardiovascular Disease

## 2019-10-28 ENCOUNTER — Encounter: Payer: Self-pay | Admitting: Cardiovascular Disease

## 2019-10-28 ENCOUNTER — Other Ambulatory Visit: Payer: Self-pay

## 2019-10-28 VITALS — BP 118/72 | HR 75 | Ht 69.0 in | Wt 175.0 lb

## 2019-10-28 DIAGNOSIS — I340 Nonrheumatic mitral (valve) insufficiency: Secondary | ICD-10-CM

## 2019-10-28 DIAGNOSIS — I10 Essential (primary) hypertension: Secondary | ICD-10-CM

## 2019-10-28 DIAGNOSIS — I471 Supraventricular tachycardia: Secondary | ICD-10-CM

## 2019-10-28 NOTE — Addendum Note (Signed)
Addended by: Rodman Key on: 10/28/2019 03:33 PM   Modules accepted: Orders

## 2019-10-28 NOTE — Progress Notes (Signed)
Chief Complaint  Patient presents with   Follow-up    SVT    History of Present Illness: 59 yo female with history of hyperlipidemia, HTN, Bipolar disorder, IBS, Mitral valve prolapse, bilateral PE (December 2016), SVT here today for cardiac follow up. She was seen as a new patient 10/08/11. She was diagnosed with mitral valve prolapse in the 1980s. At her first visit here in 2013 she c/o sharp chest pains. Coronary CTA 08/12/11 showed normal coronary arteries. Also noted skipped heart beats several times per week. Echo in 2013 showed normal LV size and function, mild MR. A 48 hour Holter monitor showed NSR with bradycardia, PACs and runs of SVT with short run of atrial fibrillation. She was seen in EP clinic by Dr. Rayann Heman October 2013 and he felt that this was most likely a long RP tachycardia. He did not recommend a change in therapy but suggested Flecainide if symptoms worsened and EP f/u only if there were changes. She was last seen in EP clinic in January 2014. She felt that her Toprol was causing hallucinations so she was started on Cardizem and Toprol stopped November 2014. She was found to have bilateral PE in December 2016 and was treated with Xarelto. She was seen in our office July 2017 by Melina Copa, PA-C following an ED visit for chest pain. Troponin negative. Reportedly had PVCs on the monitor in the ED. Chest CTA without evidence of recurrent PE. Nuclear stress test August 2017 with no evidence of ischemia. LVEF=69%. Echo December 2017 with DZHG=99-24%, grade 1 diastolic dysfunction. No valve disease.   She is here today for follow up. The patient denies any chest pain, dyspnea,  lower extremity edema, orthopnea, PND, dizziness, near syncope or syncope.   Primary Care Physician: Carollee Herter, Alferd Apa, DO   Past Medical History:  Diagnosis Date   Anxiety    Atrial fibrillation ([place])    a. Event monitor 2013 - PACs/bradycardia/SVT/short run of atrial fib by event monitor.     BIPOLAR DISORDER UNSPECIFIED    Bradycardia    Bursitis of hip 09/1999   Bilateral - Dr. Berenice Primas   DEPRESSION    Emphysema lung ([place]) 06/20/2016   pt states pulmonalongist stated started recently   GERD    Headache(784.0)    was with menstrual cycle. no longer a problem   HYPERLIPIDEMIA    Hypertension    Hypertension    IRRITABLE BOWEL SYNDROME, HX OF    Menopausal state 01/2012   [facility] = 88.5   Mitral valve prolapse 12/17/2000   a. dx 1980s, most recent echo did not demonstrate this.   MYALGIA    Normal coronary arteries    a. by cardiac CT 2013.   PAT (paroxysmal atrial tachycardia) ([facility])    PE (pulmonary embolism) 02/09/2015   a. Bilateral PEs 01/2015 when d-dimer 0.69, CP lying on left side. Followed by heme-onc - + lupus anticoagulant, mildly depressed protein S. Dr. Marin Olp is not certain if her hypercoagulable studies are significant for a thrombophilic state.   Premature atrial contractions    Pulmonary embolus ([place]) 02/10/2015   Shortness of breath    Pulmonary eval 05/2002   Thyroid nodule 2014   Tremors of nervous system     Past Surgical History:  Procedure Laterality Date   COLONOSCOPY     LAPAROSCOPIC APPENDECTOMY N/A 03/17/2017   Procedure: APPENDECTOMY LAPAROSCOPIC;  Surgeon: Excell Seltzer, MD;  Location: [facility];  Service: General;  Laterality: N/A;  Lipoma (R) side  1990   Right back    Current Outpatient Medications  Medication Sig Dispense Refill   ALPRAZolam (XANAX) 0.5 MG tablet 1 po 30 min prior to procedure 10 tablet 0   aspirin EC 325 MG tablet Take 325 mg by mouth daily.     buPROPion (WELLBUTRIN SR) 200 MG 12 hr tablet TAKE 1 TABLET BY MOUTH DAILY IN THE MORNING 90 tablet 0   ciclopirox (PENLAC) 8 % solution Apply topically at bedtime. Apply over nail and surrounding skin. Apply daily over previous coat. After seven (7) days, may remove with alcohol and continue cycle. 6.6 mL 0   DEXILANT 60 MG capsule Take 1  capsule (60 mg total) by mouth daily. 30 capsule 12   diltiazem (CARTIA XT) 180 MG 24 hr capsule Take 1 capsule (180 mg total) by mouth daily. 90 capsule 1   divalproex (DEPAKOTE ER) 500 MG 24 hr tablet TAKE 2 TABLETS BY MOUTH AT BEDTIME. 180 tablet 0   fluticasone (FLONASE) 50 MCG/ACT nasal spray Place 2 sprays into the nose daily as needed. For allergies     ketoconazole (NIZORAL) 2 % cream Apply 1 application topically daily. 15 g 1   lidocaine (XYLOCAINE) 5 % ointment Apply a pea sized amount topically 20-30 minutes prior to intercourse, wipe off just prior to intercourse. 30 g 1   modafinil (PROVIGIL) 200 MG tablet Take 1 tablet (200 mg total) by mouth in the morning and at bedtime. Increased on her own. 180 tablet 0   NONFORMULARY OR COMPOUNDED ITEM Azelaic acid; metronidazole / ivermectin cream  Bid     NONFORMULARY OR COMPOUNDED ITEM sm Azelaic acid/ metronidazole/ivermectin 15%/1%/1% cream apply to face bid 1 each 5   Probiotic Product (PROBIOTIC DAILY PO) Take 1 tablet by mouth daily.      propranolol (INDERAL) 20 MG tablet TAKE 1 TABLET BY MOUTH 2 TIMES DAILY. 180 tablet 0   sertraline (ZOLOFT) 50 MG tablet TAKE 1 TABLET BY MOUTH DAILY. 90 tablet 0   tretinoin (RETIN-A) 0.025 % cream as needed.   4   trimethoprim (TRIMPEX) 100 MG tablet After intercourse 30 tablet 1   No current facility-administered medications for this visit.    Allergies  Allergen Reactions   Cariprazine Other (See Comments)    Patient becomes suicidal when taking this medication.  Patient becomes suicidal when taking this medication.    Metoprolol Other (See Comments)    Hallucinations hair falls out Hallucinations hair falls out   Nickel Rash    Social History   Socioeconomic History   Marital status: Married    Spouse name: Not on file   Number of children: 0   Years of education: Not on file   Highest education level: Not on file  Occupational History   Occupation:  Nurse-Personal Care/HH    Employer: UNEMPLOYED  Tobacco Use   Smoking status: Former Smoker    Packs/day: 1.00    Years: 25.00    Pack years: 25.00    Types: Cigarettes    Quit date: 02/11/1996    Years since quitting: 23.7   Smokeless tobacco: Never Used   Tobacco comment: Married, lives with spouse. 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      No exercise   Pt is on nutrisystem right now   Social Determinants of Health   Financial Resource Strain:    Difficulty of Paying Living Expenses: Not on file  Food Insecurity:    Worried About [place] in the Last Year: Not on file   Ran Out of Food in the Last Year: Not on file  Transportation Needs:    Lack of Transportation (Medical): Not on file   Lack of Transportation (Non-Medical): Not on file  Physical Activity:    Days of Exercise per Week: Not on file   Minutes of Exercise per Session: Not on file  Stress:    Feeling of Stress : Not on file  Social Connections:    Frequency of Communication with Friends and Family: Not on file   Frequency of Social Gatherings with Friends and Family: Not on file   Attends Religious Services: Not on file   Active Member of Clubs or Organizations: Not on file   Attends Archivist Meetings: Not on file   Marital Status: Not on file  Intimate Partner Violence:    Fear of Current or Ex-Partner: Not on file   Emotionally Abused: Not on file   Physically Abused: Not on file   Sexually Abused: Not on file    Family History  Problem Relation Age of Onset   Lung cancer Mother     Hypertension Mother    Hyperlipidemia Mother    Cancer Father        Esophageal cancer   Hyperlipidemia Father    Depression Father    Cancer Brother    Pulmonary embolism Brother    Colon cancer Paternal Uncle    Sarcoidosis Other    Testicular cancer Other     Review of Systems:  As stated in the HPI and otherwise negative.   BP 118/72    Pulse 75    Ht 5\' 9"  (1.753 m)    Wt 175 lb (79.4 kg)    LMP 05/11/2012    SpO2 99%    BMI 25.84 kg/m   Physical Examination: General: Well developed, well nourished, NAD  HEENT: OP clear, mucus membranes moist  SKIN: warm, dry. No rashes. Neuro: No focal deficits  Musculoskeletal: Muscle strength 5/5 all ext  Psychiatric: Mood and affect normal  Neck: No JVD, no carotid bruits, no thyromegaly, no lymphadenopathy.  Lungs:Clear bilaterally, no wheezes, rhonci, crackles Cardiovascular: Regular rate and rhythm. No murmurs, gallops or rubs. Abdomen:Soft. Bowel sounds present. Non-tender.  Extremities: No lower extremity edema. Pulses are 2 + in the bilateral DP/PT.  Echo December 2017: Left ventricle: The cavity size was normal. Wall thickness was   normal. Systolic function was normal. The estimated ejection   fraction was in the range of 50% to 55%. Wall motion was normal;   there were no regional wall motion abnormalities. Doppler   parameters are consistent with abnormal left ventricular   relaxation (grade 1 diastolic dysfunction).  Impressions: - Normal LV systolic function; grade 1 diastolic dysfunction;   trace MR.  EKG:  EKG is ordered today. The ekg ordered today demonstrates Sinus  Recent Labs: 04/14/2019: TSH 3.23 06/30/2019: ALT 18; BUN 9; Creatinine, Ser 0.95; Hemoglobin 13.9; Platelets 371.0; Potassium 3.9; Sodium 139   Lipid  Panel    Component Value Date/Time   CHOL 321 (H) 04/14/2019 1522   TRIG 359.0 (H) 04/14/2019 1522   HDL 43.00 04/14/2019 1522   CHOLHDL 7 04/14/2019 1522   VLDL 71.8 (H) 04/14/2019  1522   LDLCALC 154 (H) 11/26/2017 1451   LDLDIRECT 227.0 04/14/2019 1522     Wt Readings from Last 3 Encounters:  10/28/19 175 lb (79.4 kg)  06/30/19 173 lb 6.4 oz (78.7 kg)  06/24/19 176 lb 8 oz (80.1 kg)     Other studies Reviewed: Additional studies/ records that were reviewed today include: . Review of the above records demonstrates:    Assessment and Plan:   1. SVT: She is known to have a long RP tachycardia. Rare palpitations. Continue Cardizem.  She also takes Inderal for her tremor.    2. Mitral regurgitation: Trivial by echo in December 2017.  Repeat echo in 2022. We discussed this today.   3. HTN: BP is controlled. Continue current therapy  Current medicines are reviewed at length with the patient today.  The patient does not have concerns regarding medicines.  The following changes have been made:  no change  Labs/ tests ordered today include:   No orders of the defined types were placed in this encounter.  Disposition:   FU with me in 12 months  Signed, Lauree Chandler, MD 10/28/2019 3:20 PM    [facility] [place], [place], [place]  [address] Phone: [phone]; Fax: [phone]

## 2019-10-28 NOTE — Patient Instructions (Addendum)
Medication Instructions:  No changes *If you need a refill on your cardiac medications before your next appointment, please call your pharmacy*   Lab Work: none If you have labs (blood work) drawn today and your tests are completely normal, you will receive your results only by: Marland Kitchen MyChart Message (if you have MyChart) OR . A paper copy in the mail If you have any lab test that is abnormal or we need to change your treatment, we will call you to review the results.   Testing/Procedures: None  Follow-Up: At [facility], you and your health needs are our priority.  As part of our continuing mission to provide you with exceptional heart care, we have created designated Provider Care Teams.  These Care Teams include your primary Cardiologist (physician) and Advanced Practice Providers (APPs -  Physician Assistants and Nurse Practitioners) who all work together to provide you with the care you need, when you need it.   Your next appointment:   12 month(s)  The format for your next appointment:   In Person  Provider:   You may see Lauree Chandler, MD or one of the following Advanced Practice Providers on your designated Care Team:    Melina Copa, PA-C  Ermalinda Barrios, PA-C    Other Instructions

## 2019-11-14 ENCOUNTER — Telehealth: Payer: Self-pay | Admitting: Psychiatry

## 2019-11-14 NOTE — Telephone Encounter (Signed)
Please review

## 2019-11-14 NOTE — Telephone Encounter (Signed)
Pt called and left a message that she needs a referall from dr. Clovis Pu sent to dr. Jannifer Franklin so she can be seen. His first available appt is in march.

## 2019-11-16 ENCOUNTER — Ambulatory Visit (INDEPENDENT_AMBULATORY_CARE_PROVIDER_SITE_OTHER): Payer: 59 | Admitting: Psychology

## 2019-11-16 DIAGNOSIS — F331 Major depressive disorder, recurrent, moderate: Secondary | ICD-10-CM

## 2019-11-21 ENCOUNTER — Other Ambulatory Visit: Payer: Self-pay | Admitting: Obstetrics and Gynecology

## 2019-11-21 ENCOUNTER — Other Ambulatory Visit: Payer: Self-pay | Admitting: Psychiatry

## 2019-11-21 DIAGNOSIS — N952 Postmenopausal atrophic vaginitis: Secondary | ICD-10-CM

## 2019-11-21 MED FILL — TRIMETHOPRIM 100 MG TABS: 100 | 30 days supply | Qty: 30 | Fill #0

## 2019-11-21 MED FILL — DEXILANT DR 60 MG CAPSULE: 60 | 30 days supply | Qty: 30 | Fill #7

## 2019-11-21 MED FILL — DIVALPROEX SOD ER 500 MG TA: 500 | 90 days supply | Qty: 180 | Fill #0

## 2019-11-21 NOTE — Telephone Encounter (Signed)
Medication refill request: Trimethoprin 100mg   Last AEX:  01/17/19 Next AEX: 01/18/20 Last MMG (if hormonal medication request): 08/04/19  Neg  Refill authorized: 30/0

## 2019-11-30 ENCOUNTER — Ambulatory Visit (INDEPENDENT_AMBULATORY_CARE_PROVIDER_SITE_OTHER): Payer: 59 | Admitting: Psychology

## 2019-11-30 DIAGNOSIS — F331 Major depressive disorder, recurrent, moderate: Secondary | ICD-10-CM

## 2019-12-01 ENCOUNTER — Other Ambulatory Visit: Payer: Self-pay | Admitting: Family

## 2019-12-01 DIAGNOSIS — D6859 Other primary thrombophilia: Secondary | ICD-10-CM

## 2019-12-01 DIAGNOSIS — I2602 Saddle embolus of pulmonary artery with acute cor pulmonale: Secondary | ICD-10-CM

## 2019-12-01 DIAGNOSIS — I2782 Chronic pulmonary embolism: Secondary | ICD-10-CM

## 2019-12-01 DIAGNOSIS — R760 Raised antibody titer: Secondary | ICD-10-CM

## 2019-12-02 ENCOUNTER — Inpatient Hospital Stay: Payer: 59

## 2019-12-02 ENCOUNTER — Inpatient Hospital Stay: Payer: 59 | Admitting: Family

## 2019-12-05 ENCOUNTER — Other Ambulatory Visit: Payer: Self-pay | Admitting: Psychiatry

## 2019-12-06 ENCOUNTER — Other Ambulatory Visit: Payer: Self-pay | Admitting: Psychiatry

## 2019-12-06 DIAGNOSIS — H524 Presbyopia: Secondary | ICD-10-CM | POA: Diagnosis not present

## 2019-12-06 DIAGNOSIS — H04123 Dry eye syndrome of bilateral lacrimal glands: Secondary | ICD-10-CM | POA: Diagnosis not present

## 2019-12-06 DIAGNOSIS — L719 Rosacea, unspecified: Secondary | ICD-10-CM | POA: Diagnosis not present

## 2019-12-06 DIAGNOSIS — H52203 Unspecified astigmatism, bilateral: Secondary | ICD-10-CM | POA: Diagnosis not present

## 2019-12-06 DIAGNOSIS — H10413 Chronic giant papillary conjunctivitis, bilateral: Secondary | ICD-10-CM | POA: Diagnosis not present

## 2019-12-06 DIAGNOSIS — H16423 Pannus (corneal), bilateral: Secondary | ICD-10-CM | POA: Diagnosis not present

## 2019-12-06 DIAGNOSIS — H5213 Myopia, bilateral: Secondary | ICD-10-CM | POA: Diagnosis not present

## 2019-12-06 MED FILL — SERTRALINE HCL 50 MG TABLET: 50 | 90 days supply | Qty: 90 | Fill #0

## 2019-12-06 MED FILL — PROPRANOLOL 20 MG TABLET: 20 | 90 days supply | Qty: 180 | Fill #0

## 2019-12-07 ENCOUNTER — Other Ambulatory Visit: Payer: Self-pay

## 2019-12-07 ENCOUNTER — Ambulatory Visit (INDEPENDENT_AMBULATORY_CARE_PROVIDER_SITE_OTHER): Payer: 59 | Admitting: Dermatology

## 2019-12-07 ENCOUNTER — Encounter: Payer: Self-pay | Admitting: Dermatology

## 2019-12-07 DIAGNOSIS — L578 Other skin changes due to chronic exposure to nonionizing radiation: Secondary | ICD-10-CM | POA: Diagnosis not present

## 2019-12-07 DIAGNOSIS — L910 Hypertrophic scar: Secondary | ICD-10-CM

## 2019-12-07 DIAGNOSIS — L603 Nail dystrophy: Secondary | ICD-10-CM | POA: Diagnosis not present

## 2019-12-07 DIAGNOSIS — D485 Neoplasm of uncertain behavior of skin: Secondary | ICD-10-CM

## 2019-12-07 DIAGNOSIS — L739 Follicular disorder, unspecified: Secondary | ICD-10-CM | POA: Diagnosis not present

## 2019-12-07 NOTE — Progress Notes (Signed)
Follow-Up Visit   Subjective  Beverly Cole is a 59 y.o. female who presents for the following: other (Pt has a few spots that she would like looked at on her belly button, left breast, left 4th toe nail, and a few spots on her face. )   The spot at her belly button has grown and become thick and firm since she had an appendectomy.  She also notes that a spot where a cyst was removed at the left chest has gotten firm and she wonders if the cyst has come back.  She also has some spots in her face that she would like treated at some point.  She also has treated her left fourth toenail with Penlac for 1 year without it becoming normal.  Her mother passed away from lung cancer and her father from esophageal cancer.   The following portions of the chart were reviewed this encounter and updated as appropriate: Tobacco  Allergies  Meds  Problems  Med Hx  Surg Hx  Fam Hx      Review of Systems: No other skin or systemic complaints except as noted in HPI or Assessment and Plan.  Objective  Well appearing patient in no apparent distress; mood and affect are within normal limits.  A focused examination was performed including face, left breast, umbilicus, and left foot. Relevant physical exam findings are noted in the Assessment and Plan.  Objective  Left Breast, Umbilicus: Firm pink/brown dermal plaques    Objective  Left nasofacial angle: 0.5 cm yellow papule   Objective  Left 4th Toe Nail: Thickened toenail with subungual debris   Assessment & Plan  Keloid (2) Left Breast; Umbilicus  Keloid at umbilicus  Favor keloid at left breast at site of cyst removal   Intralesional steroid injection side effects were reviewed including thinning of the skin and discoloration, such as redness, lightening or darkening.   Intralesional injection - Left Breast, Umbilicus Location: umbilicus, left breast  Informed Consent: Discussed risks (infection, pain, bleeding, bruising,  thinning of the skin, loss of skin pigment, lack of resolution, and recurrence of lesion) and benefits of the procedure, as well as the alternatives. Informed consent was obtained. Preparation: The area was prepared a standard fashion.  Procedure Details: An intralesional injection was performed with Kenalog 40 mg/cc.   4 injections with a total of 0.5 mL injected into the umbilicus.  1 injection with a total of 0.2 mL injected into the left brest   Total number of injections: 5  Plan: The patient was instructed on post-op care. Recommend OTC analgesia as needed for pain.   Neoplasm of uncertain behavior of skin Left nasofacial angle  Skin / nail biopsy Type of biopsy: tangential   Informed consent: discussed and consent obtained   Timeout: patient name, date of birth, surgical site, and procedure verified   Procedure prep:  Patient was prepped and draped in usual sterile fashion Prep type:  Isopropyl alcohol Anesthesia: the lesion was anesthetized in a standard fashion   Anesthetic:  1% lidocaine w/ epinephrine 1-100,000 buffered w/ 8.4% NaHCO3 Instrument used: flexible razor blade   Hemostasis achieved with: pressure, aluminum chloride and electrodesiccation   Outcome: patient tolerated procedure well   Post-procedure details: sterile dressing applied and wound care instructions given   Dressing type: bandage and petrolatum    Specimen 1 - Surgical pathology Differential Diagnosis: Rule out sebaceous adenoma Check Margins: No 0.5 cm yellow papule   Patient with extensive sebaceous hyperplasia  and 1-2+ possible sebaceous adenoma at the face We will evaluate the most suggestive lesion for sebaceous adenoma today.  If positive, will consider genetic referral to evaluate whether she should be tested for Muir-Torre given her family cancer history plus sebaceous adenoma   Nail dystrophy Left 4th Toe Nail  Chronic, not at goal  Likely onychomycosis > trauma. Will send nail  clippings to [facility] for culture.  Discussed that pending confirmation of onychomycosis, we will plan to review labs (CBC, CMP) and treat with pulse dose terbinafine.  Reviewed rare risk of liver damage with terbinafine.  She has taken it previously without problems.  Culture, Fungus with Smear - Left 4th Toe Nail   Actinic Damage - diffuse scaly erythematous macules with underlying dyspigmentation - Recommend daily broad spectrum sunscreen SPF 30+ to sun-exposed areas, reapply every 2 hours as needed.  - Call for new or changing lesions.  Come in at 12:45 before a 1:30 procedure to have   Return in about 4 weeks (around 01/04/2020) for for ILK to keloids; when available for cosmetic sebaceous hyperplasia treatment.   I, [name], CMA, am acting as scribe for Forest Gleason, MD.  Documentation: I have reviewed the above documentation for accuracy and completeness, and I agree with the above.  Forest Gleason, MD

## 2019-12-08 ENCOUNTER — Other Ambulatory Visit: Payer: Self-pay | Admitting: Dermatology

## 2019-12-08 ENCOUNTER — Encounter: Payer: Self-pay | Admitting: Dermatology

## 2019-12-08 DIAGNOSIS — L603 Nail dystrophy: Secondary | ICD-10-CM | POA: Diagnosis not present

## 2019-12-14 ENCOUNTER — Ambulatory Visit (INDEPENDENT_AMBULATORY_CARE_PROVIDER_SITE_OTHER): Payer: 59 | Admitting: Psychology

## 2019-12-14 DIAGNOSIS — F331 Major depressive disorder, recurrent, moderate: Secondary | ICD-10-CM

## 2019-12-14 NOTE — Progress Notes (Signed)
Skin , left nasofacial angle SEBACEOUS GLAND HYPERPLASIA  Benign oil gland growth. These are common and are not suggestive of an increased risk of Muir-Torre genetic syndrome. No additional treatment or evaluation recommended at this time (can cosmetically treat remaining sebaceous hyperplasia if desired).  MAs please call

## 2019-12-15 ENCOUNTER — Telehealth: Payer: Self-pay

## 2019-12-15 NOTE — Telephone Encounter (Signed)
Patient called and left message that she has seen results in [place]. She will call at a later time to schedule appt with Dr. Laurence Ferrari.

## 2019-12-15 NOTE — Telephone Encounter (Signed)
Left pt message to call back for bx results. JS

## 2019-12-19 ENCOUNTER — Other Ambulatory Visit: Payer: Self-pay | Admitting: Psychiatry

## 2019-12-19 MED FILL — MODAFINIL 200 MG TABLET: 200 | 90 days supply | Qty: 180 | Fill #0

## 2019-12-20 ENCOUNTER — Other Ambulatory Visit: Payer: Self-pay | Admitting: Psychiatry

## 2019-12-20 ENCOUNTER — Inpatient Hospital Stay: Payer: 59 | Attending: Hematology & Oncology

## 2019-12-20 ENCOUNTER — Inpatient Hospital Stay (HOSPITAL_BASED_OUTPATIENT_CLINIC_OR_DEPARTMENT_OTHER): Payer: 59 | Admitting: Family

## 2019-12-20 ENCOUNTER — Other Ambulatory Visit: Payer: Self-pay

## 2019-12-20 VITALS — BP 106/74 | HR 64 | Temp 98.7°F | Resp 18 | Wt 179.0 lb

## 2019-12-20 DIAGNOSIS — Z79899 Other long term (current) drug therapy: Secondary | ICD-10-CM | POA: Diagnosis not present

## 2019-12-20 DIAGNOSIS — I2602 Saddle embolus of pulmonary artery with acute cor pulmonale: Secondary | ICD-10-CM | POA: Diagnosis not present

## 2019-12-20 DIAGNOSIS — Z86711 Personal history of pulmonary embolism: Secondary | ICD-10-CM | POA: Insufficient documentation

## 2019-12-20 DIAGNOSIS — Z7901 Long term (current) use of anticoagulants: Secondary | ICD-10-CM | POA: Insufficient documentation

## 2019-12-20 DIAGNOSIS — I2782 Chronic pulmonary embolism: Secondary | ICD-10-CM

## 2019-12-20 DIAGNOSIS — D6859 Other primary thrombophilia: Secondary | ICD-10-CM

## 2019-12-20 DIAGNOSIS — D6862 Lupus anticoagulant syndrome: Secondary | ICD-10-CM | POA: Diagnosis not present

## 2019-12-20 DIAGNOSIS — R760 Raised antibody titer: Secondary | ICD-10-CM

## 2019-12-20 LAB — CBC WITH DIFFERENTIAL (CANCER CENTER ONLY)
Abs Immature Granulocytes: 0.05 10*3/uL (ref 0.00–0.07)
Basophils Absolute: 0.1 10*3/uL (ref 0.0–0.1)
Basophils Relative: 1 %
Eosinophils Absolute: 0.2 10*3/uL (ref 0.0–0.5)
Eosinophils Relative: 2 %
HCT: 40.7 % (ref 36.0–46.0)
Hemoglobin: 13.3 g/dL (ref 12.0–15.0)
Immature Granulocytes: 1 %
Lymphocytes Relative: 41 %
Lymphs Abs: 3.5 10*3/uL (ref 0.7–4.0)
MCH: 28.9 pg (ref 26.0–34.0)
MCHC: 32.7 g/dL (ref 30.0–36.0)
MCV: 88.5 fL (ref 80.0–100.0)
Monocytes Absolute: 0.5 10*3/uL (ref 0.1–1.0)
Monocytes Relative: 6 %
Neutro Abs: 4.2 10*3/uL (ref 1.7–7.7)
Neutrophils Relative %: 49 %
Platelet Count: 301 10*3/uL (ref 150–400)
RBC: 4.6 MIL/uL (ref 3.87–5.11)
RDW: 14.6 % (ref 11.5–15.5)
WBC Count: 8.5 10*3/uL (ref 4.0–10.5)
nRBC: 0 % (ref 0.0–0.2)

## 2019-12-20 LAB — CMP (CANCER CENTER ONLY)
ALT: 15 U/L (ref 0–44)
AST: 13 U/L — ABNORMAL LOW (ref 15–41)
Albumin: 4.3 g/dL (ref 3.5–5.0)
Alkaline Phosphatase: 57 U/L (ref 38–126)
Anion gap: 8 (ref 5–15)
BUN: 11 mg/dL (ref 6–20)
CO2: 28 mmol/L (ref 22–32)
Calcium: 9.6 mg/dL (ref 8.9–10.3)
Chloride: 102 mmol/L (ref 98–111)
Creatinine: 0.94 mg/dL (ref 0.44–1.00)
GFR, Estimated: >60 mL/min (ref >60)
Glucose, Bld: 88 mg/dL (ref 70–99)
Potassium: 3.7 mmol/L (ref 3.5–5.1)
Sodium: 138 mmol/L (ref 135–145)
Total Bilirubin: 0.3 mg/dL (ref 0.3–1.2)
Total Protein: 7.2 g/dL (ref 6.5–8.1)

## 2019-12-20 MED FILL — DEXILANT DR 60 MG CAPSULE: 60 | 30 days supply | Qty: 30 | Fill #8

## 2019-12-20 MED FILL — BUPROPION HCL SR 200 MG TAB: 200 | 90 days supply | Qty: 90 | Fill #0

## 2019-12-20 NOTE — Progress Notes (Signed)
Hematology and Oncology Follow Up Visit  Beverly Cole [id number] 1960-05-10 59 y.o. 12/20/2019   Principle Diagnosis:  Bilateral pulmonary embolism Positive lupus anticoagulant Mildly depressed protein S level  Past Therapy:             Xarelto 20 mg by mouth daily-finish 1 year of therapy in December 2017 Xarelto 10 mg by mouth daily-to complete 1 year in December 2018- d/c on 01/21/2017  Current Therapy: EC ASA325mg  po q day - startedon 01/21/2017   Interim History:  Beverly Cole is here today for follow-up. She is doing fairly well. She will be having some cysts removed from her forehead as well as her appendectomy keloid scar injected with dermatology Dr. Laurence Ferrari.  She is doing well on full dose aspirin daily.  No Lupus anticoagulant or protein s deficiency found on lab work last year. Today's results are pending.  No episodes of bleeding. No bruising or petechiae.  No fever, chills, n/v, cough, rash, dizziness, SOB, chest pain, palpitations or changes in bowel or bladder habits.  She has history of IBS and some intermittent abdominal discomfort at times. She did start taking a probiotic daily which seems to have helped.  No swelling, tenderness, numbness or tingling in her extremities at this time.  She has maintained a good appetite and is staying well hydrated. Her weight is stable.   ECOG Performance Status: 1 - Symptomatic but completely ambulatory  Medications:  Allergies as of 12/20/2019      Reactions   Cariprazine Other (See Comments)   Patient becomes suicidal when taking this medication.  Patient becomes suicidal when taking this medication.    Metoprolol Other (See Comments)   Hallucinations hair falls out Hallucinations hair falls out   Nickel Rash      Medication List       Accurate as of December 20, 2019  3:10 PM. If you have any questions, ask your nurse or doctor.        ALPRAZolam 0.5 MG tablet Commonly known as: Xanax 1 po 30 min prior to  procedure   aspirin EC 325 MG tablet Take 325 mg by mouth daily.   buPROPion 200 MG 12 hr tablet Commonly known as: WELLBUTRIN SR TAKE 1 TABLET BY MOUTH DAILY IN THE MORNING   ciclopirox 8 % solution Commonly known as: Penlac Apply topically at bedtime. Apply over nail and surrounding skin. Apply daily over previous coat. After seven (7) days, may remove with alcohol and continue cycle.   Dexilant 60 MG capsule Generic drug: dexlansoprazole Take 1 capsule (60 mg total) by mouth daily.   diltiazem 180 MG 24 hr capsule Commonly known as: Cartia XT Take 1 capsule (180 mg total) by mouth daily.   divalproex 500 MG 24 hr tablet Commonly known as: DEPAKOTE ER TAKE 2 TABLETS BY MOUTH AT BEDTIME.   fluticasone 50 MCG/ACT nasal spray Commonly known as: FLONASE Place 2 sprays into the nose daily as needed. For allergies   ketoconazole 2 % cream Commonly known as: NIZORAL Apply 1 application topically daily.   lidocaine 5 % ointment Commonly known as: XYLOCAINE Apply a pea sized amount topically 20-30 minutes prior to intercourse, wipe off just prior to intercourse.   modafinil 200 MG tablet Commonly known as: PROVIGIL Take 1 tablet (200 mg total) by mouth in the morning and at bedtime. Increased on her own.   NONFORMULARY OR COMPOUNDED ITEM Azelaic acid; metronidazole / ivermectin cream  Bid   NONFORMULARY OR COMPOUNDED ITEM  sm Azelaic acid/ metronidazole/ivermectin 15%/1%/1% cream apply to face bid   PROBIOTIC DAILY PO Take 1 tablet by mouth daily.   propranolol 20 MG tablet Commonly known as: INDERAL TAKE 1 TABLET BY MOUTH 2 TIMES DAILY.   sertraline 50 MG tablet Commonly known as: ZOLOFT TAKE 1 TABLET BY MOUTH DAILY.   tretinoin 0.025 % cream Commonly known as: RETIN-A as needed.   trimethoprim 100 MG tablet Commonly known as: TRIMPEX TAKE AFTER INTERCOURSE AS DIRECTED       Allergies:  Allergies  Allergen Reactions  . Cariprazine Other (See Comments)      Patient becomes suicidal when taking this medication.  Patient becomes suicidal when taking this medication.   . Metoprolol Other (See Comments)    Hallucinations hair falls out Hallucinations hair falls out  . Nickel Rash    Past Medical History, Surgical history, Social history, and Family History were reviewed and updated.  Review of Systems: All other 10 point review of systems is negative.   Physical Exam:  vitals were not taken for this visit.   Wt Readings from Last 3 Encounters:  10/28/19 175 lb (79.4 kg)  06/30/19 173 lb 6.4 oz (78.7 kg)  06/24/19 176 lb 8 oz (80.1 kg)    Ocular: Sclerae unicteric, pupils equal, round and reactive to light Ear-nose-throat: Oropharynx clear, dentition fair Lymphatic: No cervical or supraclavicular adenopathy Lungs no rales or rhonchi, good excursion bilaterally Heart regular rate and rhythm, no murmur appreciated Abd soft, nontender, positive bowel sounds MSK no focal spinal tenderness, no joint edema Neuro: non-focal, well-oriented, appropriate affect Breasts: Deferred   Lab Results  Component Value Date   WBC 8.5 12/20/2019   HGB 13.3 12/20/2019   HCT 40.7 12/20/2019   MCV 88.5 12/20/2019   PLT 301 12/20/2019   No results found for: FERRITIN, IRON, TIBC, UIBC, IRONPCTSAT Lab Results  Component Value Date   RBC 4.60 12/20/2019   No results found for: KPAFRELGTCHN, LAMBDASER, KAPLAMBRATIO No results found for: IGGSERUM, IGA, IGMSERUM No results found for: Kathrynn Ducking, MSPIKE, SPEI   Chemistry      Component Value Date/Time   NA 139 06/30/2019 1431   NA 142 01/21/2017 1013   NA 143 01/16/2016 1005   K 3.9 06/30/2019 1431   K 3.2 (L) 01/21/2017 1013   K 3.3 (L) 01/16/2016 1005   CL 104 06/30/2019 1431   CL 106 01/21/2017 1013   CO2 26 06/30/2019 1431   CO2 29 01/21/2017 1013   CO2 20 (L) 01/16/2016 1005   BUN 9 06/30/2019 1431   BUN 9 01/21/2017 1013   BUN 12.6  01/16/2016 1005   CREATININE 0.95 06/30/2019 1431   CREATININE 0.90 12/03/2018 1346   CREATININE 0.9 01/21/2017 1013   CREATININE 0.9 01/16/2016 1005      Component Value Date/Time   CALCIUM 9.5 06/30/2019 1431   CALCIUM 9.2 01/21/2017 1013   CALCIUM 9.5 01/16/2016 1005   ALKPHOS 71 06/30/2019 1431   ALKPHOS 62 01/21/2017 1013   ALKPHOS 81 01/16/2016 1005   AST 15 06/30/2019 1431   AST 14 (L) 12/03/2018 1346   AST 16 01/16/2016 1005   ALT 18 06/30/2019 1431   ALT 16 12/03/2018 1346   ALT 22 01/21/2017 1013   ALT 24 01/16/2016 1005   BILITOT 0.2 06/30/2019 1431   BILITOT 0.3 12/03/2018 1346   BILITOT 0.59 01/16/2016 1005       Impression and Plan: Ms. Pajak is a  very pleasant 59 yo caucasian female with history of bilateral pulmonary embolism while on hormonal replacement therapy. She also had a slightly low protein S level and positive lupus anticoagulant at diagnosis but these have been negative at following visits.  Lab drawn today and results are pending.  At this time we will just follow-up as needed.  Patient in agreement and encouraged to contact our office with any questions or concerns. We can certainly see her again if needed.   Laverna Peace, NP 11/9/20213:10 PM

## 2019-12-21 ENCOUNTER — Other Ambulatory Visit: Payer: Self-pay

## 2019-12-21 ENCOUNTER — Ambulatory Visit (INDEPENDENT_AMBULATORY_CARE_PROVIDER_SITE_OTHER): Payer: 59 | Admitting: Dermatology

## 2019-12-21 ENCOUNTER — Telehealth: Payer: Self-pay

## 2019-12-21 DIAGNOSIS — S21002A Unspecified open wound of left breast, initial encounter: Secondary | ICD-10-CM

## 2019-12-21 DIAGNOSIS — L910 Hypertrophic scar: Secondary | ICD-10-CM

## 2019-12-21 DIAGNOSIS — T148XXA Other injury of unspecified body region, initial encounter: Secondary | ICD-10-CM

## 2019-12-21 LAB — LUPUS ANTICOAGULANT PANEL
DRVVT: 28.7 s (ref 0.0–47.0)
PTT Lupus Anticoagulant: 27.2 s (ref 0.0–51.9)

## 2019-12-21 LAB — PROTEIN S, TOTAL: Protein S Ag, Total: 159 % — ABNORMAL HIGH (ref 60–150)

## 2019-12-21 LAB — PROTEIN S ACTIVITY: Protein S Activity: 108 % (ref 63–140)

## 2019-12-21 NOTE — Progress Notes (Signed)
   Follow-Up Visit   Subjective  Beverly Cole is a 59 y.o. female who presents for the following: Follow-up (f/u keloid injections at left breast and umbilicus).  The following portions of the chart were reviewed this encounter and updated as appropriate: Tobacco  Allergies  Meds  Problems  Med Hx  Surg Hx  Fam Hx      Review of Systems: No other skin or systemic complaints except as noted in HPI or Assessment and Plan.   Objective  Well appearing patient in no apparent distress; mood and affect are within normal limits.  A focused examination was performed including left breast and umbilicus. Relevant physical exam findings are noted in the Assessment and Plan.  Objective  Left Breast, Umbilicus: Firm pink/brown dermal plaques  Objective  Left Breast: Erosion/excoriation  Assessment & Plan  Keloid (2) Left Breast; Umbilicus  Intralesional injection - Left Breast, Umbilicus Location: left breast and umbilicus  Informed Consent: Discussed risks (infection, pain, bleeding, bruising, thinning of the skin, loss of skin pigment, lack of resolution, and recurrence of lesion) and benefits of the procedure, as well as the alternatives. Informed consent was obtained. Preparation: The area was prepared a standard fashion.  Anesthesia: None  Procedure Details: An intralesional injection was performed with Kenalog 40 mg/cc. 0.4 cc in total were injected.  Total number of injections: 4  Plan: The patient was instructed on post-op care. Recommend OTC analgesia as needed for pain.   Open wound Left Breast  Benign appearing.  Apply neosporin 3 times a day. Pt has at home.  Call if not clearing.  Return in about 6 weeks (around 02/01/2020).   I, [name], CMA, am acting as scribe for Forest Gleason, MD.  Documentation: I have reviewed the above documentation for accuracy and completeness, and I agree with the above.  Forest Gleason, MD

## 2019-12-21 NOTE — Patient Instructions (Signed)
Recommend Serica scar cream at night.

## 2019-12-21 NOTE — Telephone Encounter (Signed)
Per 12/20/19 los "Will follow-up now PRN :)"... AOM

## 2019-12-28 ENCOUNTER — Ambulatory Visit (INDEPENDENT_AMBULATORY_CARE_PROVIDER_SITE_OTHER): Payer: 59 | Admitting: Psychology

## 2019-12-28 DIAGNOSIS — F331 Major depressive disorder, recurrent, moderate: Secondary | ICD-10-CM | POA: Diagnosis not present

## 2019-12-30 LAB — FUNGUS CULTURE W SMEAR

## 2020-01-02 ENCOUNTER — Encounter: Payer: Self-pay | Admitting: Dermatology

## 2020-01-04 NOTE — Progress Notes (Signed)
No fungus found on fungal culture. No treatment recommended at this time. If it continues to be bothersome, could consider sending a larger clipping to check under the microscope.

## 2020-01-10 ENCOUNTER — Other Ambulatory Visit: Payer: Self-pay | Admitting: Psychiatry

## 2020-01-10 ENCOUNTER — Telehealth (INDEPENDENT_AMBULATORY_CARE_PROVIDER_SITE_OTHER): Payer: 59 | Admitting: Psychiatry

## 2020-01-10 ENCOUNTER — Encounter: Payer: Self-pay | Admitting: Psychiatry

## 2020-01-10 DIAGNOSIS — F4001 Agoraphobia with panic disorder: Secondary | ICD-10-CM | POA: Diagnosis not present

## 2020-01-10 DIAGNOSIS — F4000 Agoraphobia, unspecified: Secondary | ICD-10-CM | POA: Diagnosis not present

## 2020-01-10 DIAGNOSIS — G250 Essential tremor: Secondary | ICD-10-CM

## 2020-01-10 DIAGNOSIS — F314 Bipolar disorder, current episode depressed, severe, without psychotic features: Secondary | ICD-10-CM | POA: Diagnosis not present

## 2020-01-10 DIAGNOSIS — F40240 Claustrophobia: Secondary | ICD-10-CM

## 2020-01-10 DIAGNOSIS — F40233 Fear of injury: Secondary | ICD-10-CM

## 2020-01-10 DIAGNOSIS — F423 Hoarding disorder: Secondary | ICD-10-CM

## 2020-01-10 DIAGNOSIS — F411 Generalized anxiety disorder: Secondary | ICD-10-CM

## 2020-01-10 MED ORDER — ALPRAZOLAM 0.5 MG PO TABS: 0.5000 mg | ORAL_TABLET | Freq: Two times a day (BID) | ORAL | 0 refills | Status: DC | PRN

## 2020-01-10 MED ORDER — OLANZAPINE 5 MG PO TABS: 5.0000 mg | ORAL_TABLET | Freq: Every day | ORAL | 1 refills | Status: DC

## 2020-01-10 MED ORDER — FLUOXETINE HCL 20 MG PO CAPS: 20.0000 mg | ORAL_CAPSULE | Freq: Every day | ORAL | 1 refills | Status: DC

## 2020-01-10 MED FILL — FLUoxetine HCL 20 MG CAPS: 20 | 30 days supply | Qty: 30 | Fill #0

## 2020-01-10 MED FILL — OLANZapine 5 MG TABS: 5 | 30 days supply | Qty: 30 | Fill #0

## 2020-01-10 MED FILL — ALPRAZolam 0.5 MG TABS: 0.5 | 15 days supply | Qty: 30 | Fill #0

## 2020-01-10 NOTE — Progress Notes (Signed)
Beverly Cole [id number] 1960/11/20 59 y.o.  Video Visit via My Chart  I connected with pt by My Chart and verified that I am speaking with the correct person using two identifiers.   I discussed the limitations, risks, security and privacy concerns of performing an evaluation and management service by My Chart  and the availability of in person appointments. I also discussed with the patient that there may be a patient responsible charge related to this service. The patient expressed understanding and agreed to proceed.  I discussed the assessment and treatment plan with the patient. The patient was provided an opportunity to ask questions and all were answered. The patient agreed with the plan and demonstrated an understanding of the instructions.   The patient was advised to call back or seek an in-person evaluation if the symptoms worsen or if the condition fails to improve as anticipated.  I provided 30 minutes of video time during this encounter.  The patient was located at home and the provider was located office. Session started 345 and ended 415  Subjective:   Patient ID:  Beverly Cole is a 59 y.o. (DOB 10/24/1960) female.  Chief Complaint:  Chief Complaint  Patient presents with  . Follow-up    Depression        Associated symptoms include no decreased concentration and no suicidal ideas.  Past medical history includes anxiety.   Anxiety Symptoms include dizziness, nervous/anxious behavior and shortness of breath. Patient reports no chest pain, confusion, decreased concentration or suicidal ideas.     [name]  today for follow-up of chronic depression and anxiety.  Lately depression worse than anxiety.  Seen with husband today  When seen May 11, 2018.  For persistent anxiety we elected to retry buspirone and try increasing it to 30 mg twice daily if tolerated.   Couldn't tolerate it this time DT muscle spasms.  Pharmacy called saying she could have serotonin  syndrome.  When seen August 09, 2018 and she refused med changes despite chronic anxiety and depression. She remained on sertraline 50, Depakote ER 1000 mg, Wellbutrin SR 200 mg AM  Panic wearing cloth masks.  Using a shield.  Not in the office today bc H exposed to Covid.    seen December 09, 2018.  Because of chronic depression and dysfunction with fatigue and poor motivation we decided off label trial  trial of modafinil 200 mg 1/2 each am for 1 week then 200 mg each AM. Did see benefit from it.  More energy and working to stay out of bed more.    seen January 10, 2019.  The modafinil had been helpful.  No meds were changed.  I seen April 07, 2019.  The following was noted: Still sees benefit from modafinil but still not caring for herself like she should.  Not wanting to go anywhere.  Not driven since early fall.  Hard to breathe with the mask and it creates anxiety.  Hard to be in enclosed spaces like cars and planes for extended periods.   Trying to set her alarm clock.  No hallucinations.  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  Plans to send more too.  Still follow through is not great.  Still depression and productivity is still poor but it is not as poor as it was before modafinil. Plan increase modafinil to 300 mg daily to see if function can be  improved.  06/29/2019 appointment, the following is noted: Rare Xanax.  Did increase modafinil to 300 mg daily. Saw benefit with the increase with less time in bed but still markedly functionally impaired.   It seems like I get used to it and then doesn't maintain her get up and go.  Still having a hard time with self care like hygiene. Chronic low motivation and lack of interest is unchanged.   Gall bladder problems.  It's better at the moment.    Not more sad, just like I've always been.  Can't make herself leave the house DT depression and anxiety.  She won't do chores.    Less excess sleeping.  He works from home 2 days weekly.  No periods of hyperactivity or manic sx since the spring. Attending therapy every 2 weeks.   Can be confused when first wakes up regardless of time of day. Had episode of hallucination in the middle of the night when awakened. Scarecrow frightened her. This is rare event.  No hallucinations during the day.  Melatonin makes it worse. Has had fearful thoughts of planes crashing or bad things happening to others and it might be her fault.  Fight with brother lately and not speaking with him now. Plan:  No med changes  10/11/19 appt with the following noted: Unsteady and tremors gotten worse.  Not gone to doctor. Propranolol not helping as much.   On Depakote ER 1000mg  HS which is a reduction. So tired all the time and H says she's not doing anything.  H says accomplishing littte and still lays in bed a lot.  H says it's 2-3 PM before she gets OOB.  Memory is poor.  Everything is such a chore and doesn't want to do things. H says she starts things she doesn't finish.  Lots of new projects.   Increase modafinil on her own to 400 mg daily in the AM.  Anxious.  Rare Xanax.  Was happy when got new cats in the summer.  Anxious and Depressed and describes anxiety as Moderate. Anxiety symptoms include: Excessive Worry,.  She thinks sertraline reduces her obsessiveness.  Past hx panic so avoidant. Never get to relax.  Pt reports sleeps excessively even with modafinil. Pt reports that appetite is good. Pt reports that energy is poor and loss of interest or pleasure in usual activities, poor motivation and withdrawn from usual activities. Just don't care about things and admits to a lot of general anxiety and everything takes a lot of energy out of her.  Compulsively tapes and watches TV shows, news and awards shows.  Can't delete it bc I'll miss something.  Watches TV in bed. Concentration is poor. Suicidal thoughts:  denied by patient. But chronic death  thoughts.  Poor productivity overall.  Chronic poor self care, showers only weekly.  Just don't care.  Chronic disability.  She thinks sertraline helped the anxiety some.  Likes that H is at home.  Helps her mood.  Doesn't care about going out. Feels faint when she tries to wear a mask.  Too anxious driving and is avoidant. Collects TV shows, she can't erase unless she watches entirely. Hoards some bc afraid she might need it some day.   Rare use of Xanax bc fears addiction. Plan: Attempted referral to neurology at [facility] neurology.  They refused to see patient stating they had nothing to offer her. It was suggested to patient that she try to get in with Dr. Jannifer Franklin whom she had seen  in the past.  01/10/2020 appointment with the following noted: Reduced modafinil to 300 mg bc didn't want to get hooked on anything about 2-3 weeks.  Did initially see benefit from the modafinil but seemed to get tolerant to it. Feels better not talking to brother bc differing views of politics.  Feels he's a negative person but never depressed. Overall thinks she's doing pretty well relatively.  However is chronically depressed and never happy.  Nothing changed with the meds. Depression worse than anxiety. H didn't think modafinil made much difference.  H CO her inactivity.. She felt it was helpful for energy initially. Had to cancel trips bc can't wear a mask for that long.  Makes her sad. Driven twice in a year or so DT anxiety. Wants prn Xanax.   Afraid to try new meds after negative reaction to Vraylar.  Afraid of any med change.  Afraid of having SI and afraid she'd do it if had SI. Hopeless about getting better.  Saw pulmonologist over weakness and he says she's OK except deconditioned.  Can't tolerate wearing the masks.  Past psych med trials: Wellbutrin XL 300, Sertraline  Vraylar SI, Latuda, Abilify ,  pramipexole, modafinil,  Seroquel which caused side effects of constipation,   lamotrigine,  refuses  lithium because of altered taste.   She has a history of ataxia on 1500 mg of Depakote.   buspirone SE hallucinations ECT 2016 without help,    Review of Systems:  Review of Systems  Respiratory: Positive for shortness of breath.   Cardiovascular: Negative for chest pain.  Gastrointestinal: Positive for abdominal pain.  Neurological: Positive for dizziness and tremors. Negative for weakness.  Psychiatric/Behavioral: Positive for depression, dysphoric mood and sleep disturbance. Negative for agitation, behavioral problems, confusion, decreased concentration, hallucinations, self-injury and suicidal ideas. The patient is nervous/anxious. The patient is not hyperactive.     Medications: I have reviewed the patient's current medications.  Current Outpatient Medications  Medication Sig Dispense Refill  . aspirin EC 325 MG tablet Take 325 mg by mouth daily.    Marland Kitchen buPROPion (WELLBUTRIN SR) 200 MG 12 hr tablet TAKE 1 TABLET BY MOUTH DAILY IN THE MORNING 90 tablet 0  . ciclopirox (PENLAC) 8 % solution Apply topically at bedtime. Apply over nail and surrounding skin. Apply daily over previous coat. After seven (7) days, may remove with alcohol and continue cycle. 6.6 mL 0  . DEXILANT 60 MG capsule Take 1 capsule (60 mg total) by mouth daily. 30 capsule 12  . diltiazem (CARTIA XT) 180 MG 24 hr capsule Take 1 capsule (180 mg total) by mouth daily. 90 capsule 1  . divalproex (DEPAKOTE ER) 500 MG 24 hr tablet TAKE 2 TABLETS BY MOUTH AT BEDTIME. 180 tablet 0  . fluticasone (FLONASE) 50 MCG/ACT nasal spray Place 2 sprays into the nose daily as needed. For allergies    . ketoconazole (NIZORAL) 2 % cream Apply 1 application topically daily. 15 g 1  . lidocaine (XYLOCAINE) 5 % ointment Apply a pea sized amount topically 20-30 minutes prior to intercourse, wipe off just prior to intercourse. 30 g 1  . modafinil (PROVIGIL) 200 MG tablet Take 1 tablet (200 mg total) by mouth in the morning and at bedtime.  Increased on her own. (Patient taking differently: Take 300 mg by mouth daily. ) 180 tablet 0  . NONFORMULARY OR COMPOUNDED ITEM sm Azelaic acid/ metronidazole/ivermectin 15%/1%/1% cream apply to face bid 1 each 5  . Probiotic Product (PROBIOTIC DAILY PO)  Take 1 tablet by mouth daily.     . propranolol (INDERAL) 20 MG tablet TAKE 1 TABLET BY MOUTH 2 TIMES DAILY. 180 tablet 0  . tretinoin (RETIN-A) 0.025 % cream as needed.   4  . trimethoprim (TRIMPEX) 100 MG tablet TAKE AFTER INTERCOURSE AS DIRECTED 30 tablet 0  . ALPRAZolam (XANAX) 0.5 MG tablet Take 1 tablet (0.5 mg total) by mouth 2 (two) times daily as needed for anxiety. 1 po 30 min prior to procedure 30 tablet 0  . FLUoxetine (PROZAC) 20 MG capsule Take 1 capsule (20 mg total) by mouth daily. 30 capsule 1  . NONFORMULARY OR COMPOUNDED ITEM Azelaic acid; metronidazole / ivermectin cream  Bid    . OLANZapine (ZYPREXA) 5 MG tablet Take 1 tablet (5 mg total) by mouth at bedtime. 30 tablet 1   No current facility-administered medications for this visit.    Medication Side Effects: None  Allergies:  Allergies  Allergen Reactions  . Cariprazine Other (See Comments)    Patient becomes suicidal when taking this medication.  Patient becomes suicidal when taking this medication.   . Metoprolol Other (See Comments)    Hallucinations hair falls out Hallucinations hair falls out  . Nickel Rash    Past Medical History:  Diagnosis Date  . Anxiety   . Atrial fibrillation ([place])    a. Event monitor 2013 - PACs/bradycardia/SVT/short run of atrial fib by event monitor.   Marland Kitchen BIPOLAR DISORDER UNSPECIFIED   . Bradycardia   . Bursitis of hip 09/1999   Bilateral - Dr. Berenice Primas  . DEPRESSION   . Emphysema lung ([place]) 06/20/2016   pt states pulmonalongist stated started recently  . GERD   . Headache(784.0)    was with menstrual cycle. no longer a problem  . HYPERLIPIDEMIA   . Hypertension   . Hypertension   . IRRITABLE BOWEL SYNDROME, HX OF   .  Menopausal state 01/2012   FSH = 88.5  . Mitral valve prolapse 12/17/2000   a. dx 1980s, most recent echo did not demonstrate this.  Marland Kitchen MYALGIA   . Normal coronary arteries    a. by cardiac CT 2013.  Marland Kitchen PAT (paroxysmal atrial tachycardia) ([place])   . PE (pulmonary embolism) 02/09/2015   a. Bilateral PEs 01/2015 when d-dimer 0.69, CP lying on left side. Followed by heme-onc - + lupus anticoagulant, mildly depressed protein S. Dr. Marin Olp is not certain if her hypercoagulable studies are significant for a thrombophilic state.  . Premature atrial contractions   . Pulmonary embolus ([place]) 02/10/2015  . Shortness of breath    Pulmonary eval 05/2002  . Thyroid nodule 2014  . Tremors of nervous system     Family History  Problem Relation Age of Onset  . Lung cancer Mother   . Hypertension Mother   . Hyperlipidemia Mother   . Cancer Father        Esophageal cancer  . Hyperlipidemia Father   . Depression Father   . Cancer Brother   . Pulmonary embolism Brother   . Colon cancer Paternal Uncle   . Sarcoidosis Other   . Testicular cancer Other     Social History   Socioeconomic History  . Marital status: Married    Spouse name: Not on file  . Number of children: 0  . Years of education: Not on file  . Highest education level: Not on file  Occupational History  . Occupation: Nurse-Personal Care/HH    Employer: UNEMPLOYED  Tobacco Use  .  Smoking status: Former Smoker    Packs/day: 1.00    Years: 25.00    Pack years: 25.00    Types: Cigarettes    Quit date: 02/11/1996    Years since quitting: 23.9  . Smokeless tobacco: Never Used  . Tobacco comment: Married, lives with spouse. 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 Vaping Use: Never used  Substance and Sexual Activity  . Alcohol use: Yes    Comment: occ  . Drug use: No  . Sexual activity: Yes    Partners: Male    Birth control/protection: Post-menopausal  Other Topics Concern  . Not on file  Social History  Narrative   Married, lives with spouse in [place]. 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 Difficulty of Paying Living Expenses: Not on file  Food Insecurity:   . Worried About Charity fundraiser in the Last Year: Not on file  . Ran Out of Food in the Last Year: Not on file  Transportation Needs:   . Lack of Transportation (Medical): Not on file  . Lack of Transportation (Non-Medical): Not on file  Physical Activity:   . Days of Exercise per Week: Not on file  . Minutes of Exercise per Session: Not on file  Stress:   . Feeling of Stress : Not on file  Social Connections:   . Frequency of Communication with Friends and Family: Not on file  . Frequency of Social Gatherings with Friends and Family: Not on file  . Attends Religious Services: Not on file  . Active Member of Clubs or Organizations: Not on file  . Attends Archivist Meetings: Not on file  . Marital Status: Not on file  Intimate Partner Violence:   . Fear of Current or Ex-Partner: Not on file  . Emotionally Abused: Not on file  . Physically Abused: Not on file  . Sexually Abused: Not on file    Past Medical History, Surgical history, Social history, and Family history were reviewed and updated as appropriate.   Please see review of systems for further details on the patient's review from today.   Objective:   Physical Exam:  LMP 05/11/2012   Physical Exam Neurological:     Mental Status: She is alert and oriented to person, place, and time.     Cranial Nerves: No dysarthria.  Psychiatric:        Attention and Perception: Attention and perception normal.        Mood and Affect: Mood is anxious and depressed.        Speech: Speech normal.        Behavior: Behavior is cooperative.        Thought Content: Thought content normal. Thought content is not paranoid or delusional.  Thought content does not include homicidal or suicidal ideation. Thought content does not include homicidal or suicidal plan.        Cognition and Memory: Cognition and memory normal.        Judgment: Judgment normal.     Comments: Insight intact     Lab Review:     Component Value Date/Time   NA 138 12/20/2019 1449   NA 142 01/21/2017 1013   NA 143 01/16/2016 1005   K 3.7 12/20/2019 1449   K 3.2 (  L) 01/21/2017 1013   K 3.3 (L) 01/16/2016 1005   CL 102 12/20/2019 1449   CL 106 01/21/2017 1013   CO2 28 12/20/2019 1449   CO2 29 01/21/2017 1013   CO2 20 (L) 01/16/2016 1005   GLUCOSE 88 12/20/2019 1449   GLUCOSE 95 01/21/2017 1013   BUN 11 12/20/2019 1449   BUN 9 01/21/2017 1013   BUN 12.6 01/16/2016 1005   CREATININE 0.94 12/20/2019 1449   CREATININE 0.9 01/21/2017 1013   CREATININE 0.9 01/16/2016 1005   CALCIUM 9.6 12/20/2019 1449   CALCIUM 9.2 01/21/2017 1013   CALCIUM 9.5 01/16/2016 1005   PROT 7.2 12/20/2019 1449   PROT 6.9 01/21/2017 1013   PROT 7.1 01/16/2016 1005   ALBUMIN 4.3 12/20/2019 1449   ALBUMIN 3.3 01/21/2017 1013   ALBUMIN 3.6 01/16/2016 1005   AST 13 (L) 12/20/2019 1449   AST 16 01/16/2016 1005   ALT 15 12/20/2019 1449   ALT 22 01/21/2017 1013   ALT 24 01/16/2016 1005   ALKPHOS 57 12/20/2019 1449   ALKPHOS 62 01/21/2017 1013   ALKPHOS 81 01/16/2016 1005   BILITOT 0.3 12/20/2019 1449   BILITOT 0.59 01/16/2016 1005   GFRNONAA >60 12/20/2019 1449   GFRAA >60 12/03/2018 1346       Component Value Date/Time   WBC 8.5 12/20/2019 1449   WBC 8.4 06/30/2019 1431   RBC 4.60 12/20/2019 1449   HGB 13.3 12/20/2019 1449   HGB 12.8 01/21/2017 1013   HCT 40.7 12/20/2019 1449   HCT 38.2 01/21/2017 1013   PLT 301 12/20/2019 1449   PLT 176 01/21/2017 1013   MCV 88.5 12/20/2019 1449   MCV 90 01/21/2017 1013   MCH 28.9 12/20/2019 1449   MCHC 32.7 12/20/2019 1449   RDW 14.6 12/20/2019 1449   RDW 14.5 01/21/2017 1013   LYMPHSABS 3.5 12/20/2019 1449    LYMPHSABS 3.6 (H) 01/21/2017 1013   MONOABS 0.5 12/20/2019 1449   EOSABS 0.2 12/20/2019 1449   EOSABS 0.5 01/21/2017 1013   BASOSABS 0.1 12/20/2019 1449   BASOSABS 0.0 01/21/2017 1013    No results found for: POCLITH, LITHIUM   Lab Results  Component Value Date   PHENYTOIN <0.5 ug/mL (L) 11/05/2006   VALPROATE 73.7 11/25/2016     .res Assessment: Plan:    Lucienne was seen today for follow-up.  Diagnoses and all orders for this visit:  Bipolar disorder with severe depression ([place]) -     OLANZapine (ZYPREXA) 5 MG tablet; Take 1 tablet (5 mg total) by mouth at bedtime. -     FLUoxetine (PROZAC) 20 MG capsule; Take 1 capsule (20 mg total) by mouth daily.  Generalized anxiety disorder -     FLUoxetine (PROZAC) 20 MG capsule; Take 1 capsule (20 mg total) by mouth daily.  Panic disorder with agoraphobia -     ALPRAZolam (XANAX) 0.5 MG tablet; Take 1 tablet (0.5 mg total) by mouth 2 (two) times daily as needed for anxiety. 1 po 30 min prior to procedure -     FLUoxetine (PROZAC) 20 MG capsule; Take 1 capsule (20 mg total) by mouth daily.  Benign essential tremor  Hoarding behavior  Claustrophobia  Fear of injury  Agoraphobia -     ALPRAZolam (XANAX) 0.5 MG tablet; Take 1 tablet (0.5 mg total) by mouth 2 (two) times daily as needed for anxiety. 1 po 30 min prior to procedure   Poor sleep hygiene.  Improved with modafinil and motivation is better with  it but wants to increase it.  TRD.  Lifelong history of chronic anhedonic depression and anxiety with very poor functioning continues.  Prognosis is guarded bc multiple med failures and her resistance to change and chronicity of sx and low motivation for change.  She gets some benefit from the current medications a little bit more active and interested and motivated with the addition of modafinil without side effects.  She still is highly dysfunctional..  She is not currently manic.  She is tolerating the medications.  She is had  multiple medication failures as noted above.  She is fearful of trying new medications because of a history of suicidal thoughts on Vraylar.   Failed all FDA approved bipolar depression meds except Symbyax.   Chronic severe depression and poor function and negative thinking.  Consider olanzapine/fluoxetine combo but she fear of weight gain.  Consider off label Caplyta.  .there are other atypical antipsychotic mood stabilizers that could be considered and she agrees today.  Has gained 15# since Covid.  Consider selegiline. DC sertraline Start fluoxetine 20 mg daily with olanzapine 5 mg daily.   Discussed potential metabolic side effects associated with atypical antipsychotics, as well as potential risk for movement side effects. Advised pt to contact office if movement side effects occur.   Push fluids bc so much Time in bed and past history of dehydration. Disc risk propranolol but it helps tremor.  Increase activity.  Work on sleep schedule and try to regulate it for overall mental health benefits.  Keep working on improving activity.  Every little bit of progress can be additive over time.  Discussed the Cognitive behavioral approaches.  Doesn't drive much.  She is not motivated to do these things right now.  Modafinil would be likely safer than traditional stimulants which could cause psychotic sx.  Discussed side effects.  She has had none.   Continue other meds. Dont' increase further, Benefit 400 mg modafinil.  Okay to continue the dose reduced to 300 mg daily.  Still seeing therapist Arville Lime every 2 weeks..  This appt was 30 mins.  FU 6 weeks  Lynder Parents, MD, DFAPA   Please see After Visit Summary for patient specific instructions.  Future Appointments  Date Time Provider [place]  01/11/2020  2:00 PM Barrie Folk, [place] [facility] None  01/18/2020  3:00 PM Salvadore Dom, MD [place] None  01/24/2020  3:00 PM Bauert, Nicolasa Ducking, LCSW [facility] None  02/15/2020   1:30 PM Laurence Ferrari, [place], MD [facility] None  04/24/2020  2:40 PM Lowne Koren Shiver, DO [facility]    No orders of the defined types were placed in this encounter.     -------------------------------

## 2020-01-11 ENCOUNTER — Ambulatory Visit (INDEPENDENT_AMBULATORY_CARE_PROVIDER_SITE_OTHER): Payer: 59 | Admitting: Psychology

## 2020-01-11 DIAGNOSIS — F331 Major depressive disorder, recurrent, moderate: Secondary | ICD-10-CM

## 2020-01-17 ENCOUNTER — Other Ambulatory Visit: Payer: Self-pay | Admitting: Family Medicine

## 2020-01-17 DIAGNOSIS — I10 Essential (primary) hypertension: Secondary | ICD-10-CM

## 2020-01-17 MED FILL — CARTIA XT 180 MG CAPSULE SA: 180 | 90 days supply | Qty: 90 | Fill #0

## 2020-01-18 ENCOUNTER — Ambulatory Visit: Payer: Self-pay | Admitting: Obstetrics and Gynecology

## 2020-01-18 MED FILL — DEXILANT DR 60 MG CAPSULE: 60 | 30 days supply | Qty: 30 | Fill #9

## 2020-01-24 ENCOUNTER — Ambulatory Visit (INDEPENDENT_AMBULATORY_CARE_PROVIDER_SITE_OTHER): Payer: 59 | Admitting: Psychology

## 2020-01-24 DIAGNOSIS — F331 Major depressive disorder, recurrent, moderate: Secondary | ICD-10-CM | POA: Diagnosis not present

## 2020-01-25 ENCOUNTER — Ambulatory Visit: Payer: 59 | Admitting: Psychology

## 2020-01-25 ENCOUNTER — Ambulatory Visit (INDEPENDENT_AMBULATORY_CARE_PROVIDER_SITE_OTHER): Payer: 59 | Admitting: Dermatology

## 2020-01-25 ENCOUNTER — Other Ambulatory Visit: Payer: Self-pay

## 2020-01-25 DIAGNOSIS — L910 Hypertrophic scar: Secondary | ICD-10-CM | POA: Diagnosis not present

## 2020-01-25 NOTE — Progress Notes (Signed)
° °  Follow-Up Visit   Subjective  Beverly Cole is a 59 y.o. female who presents for the following: Keloid (F/u for keloid at the umbilicus being treated with ILK injections).    The following portions of the chart were reviewed this encounter and updated as appropriate:  Tobacco   Allergies   Meds   Problems   Med Hx   Surg Hx   Fam Hx       Review of Systems: No other skin or systemic complaints except as noted in HPI or Assessment and Plan.   Objective  Well appearing patient in no apparent distress; mood and affect are within normal limits.  A focused examination was performed including umbilicus. Relevant physical exam findings are noted in the Assessment and Plan.  Objective  Umbilicus: Thickened scar at umbilicus   Assessment & Plan  Keloid Umbilicus  Intralesional steroid injection side effects were reviewed including thinning of the skin and discoloration, such as redness, lightening or darkening.   Intralesional injection - Umbilicus Location: umbilicus  Informed Consent: Discussed risks (infection, pain, bleeding, bruising, thinning of the skin, loss of skin pigment, lack of resolution, and recurrence of lesion) and benefits of the procedure, as well as the alternatives. Informed consent was obtained. Preparation: The area was prepared a standard fashion.  Anesthesia:Lidocaine 2% with epinephrine  Procedure Details: An intralesional injection was performed with Kenalog 40 mg/cc. 0.35 cc in total were injected.  Total number of injections: 3  Plan: The patient was instructed on post-op care. Recommend OTC analgesia as needed for pain.   F/u as scheduled  I, Beverly Cole, CMA, am acting as scribe for Forest Gleason, MD.  Documentation: I have reviewed the above documentation for accuracy and completeness, and I agree with the above.  Forest Gleason, MD

## 2020-02-01 ENCOUNTER — Encounter: Payer: Self-pay | Admitting: Dermatology

## 2020-02-15 ENCOUNTER — Ambulatory Visit: Payer: 59 | Admitting: Psychology

## 2020-02-15 ENCOUNTER — Other Ambulatory Visit: Payer: Self-pay | Admitting: Dermatology

## 2020-02-15 ENCOUNTER — Ambulatory Visit (INDEPENDENT_AMBULATORY_CARE_PROVIDER_SITE_OTHER): Payer: 59 | Admitting: Dermatology

## 2020-02-15 ENCOUNTER — Other Ambulatory Visit: Payer: Self-pay

## 2020-02-15 ENCOUNTER — Encounter: Payer: Self-pay | Admitting: Dermatology

## 2020-02-15 DIAGNOSIS — B351 Tinea unguium: Secondary | ICD-10-CM | POA: Diagnosis not present

## 2020-02-15 DIAGNOSIS — L910 Hypertrophic scar: Secondary | ICD-10-CM

## 2020-02-15 MED ORDER — TERBINAFINE HCL 250 MG PO TABS: ORAL_TABLET | ORAL | 0 refills | Status: DC

## 2020-02-15 NOTE — Progress Notes (Signed)
   Follow-Up Visit   Subjective  Beverly Cole is a 60 y.o. female who presents for the following: Follow-up (Patient here to treat sebaceous hyperplasia at face. Patient also would like to follow up on keloid at umbilicus and have another IL injection if appropriate. ).  Patient also with thickened and bothersome toenail left 4th toe problematic for over a year.  Improved on Penlac but then came back.   The following portions of the chart were reviewed this encounter and updated as appropriate:   Tobacco  Allergies  Meds  Problems  Med Hx  Surg Hx  Fam Hx      Review of Systems:  No other skin or systemic complaints except as noted in HPI or Assessment and Plan.  Objective  Well appearing patient in no apparent distress; mood and affect are within normal limits.  A focused examination was performed including abdomen, feet, face. Relevant physical exam findings are noted in the Assessment and Plan.  Objective  Umbilicus: Firm pink/brown dermal plaque  Objective  Left 4th Toe Nail Plate: Thickened toenail with subungual debris   Assessment & Plan  Keloid Umbilicus  Intralesional injection - Umbilicus Location: umbilicus  Informed Consent: Discussed risks (infection, pain, bleeding, bruising, thinning of the skin, loss of skin pigment, lack of resolution, and recurrence of lesion) and benefits of the procedure, as well as the alternatives. Informed consent was obtained. Preparation: The area was prepared a standard fashion.  Procedure Details: An intralesional injection was performed with Kenalog 40 mg/cc. 0.3 cc in total were injected.  Total number of injections: 5  Plan: The patient was instructed on post-op care. Recommend OTC analgesia as needed for pain.   Onychomycosis Left 4th Toe Nail Plate  Chronic condition present over one year. Condition is bothersome to patient. Currently flared and active. S/p penlac without resolution.  Reviewed CMP, CBC with  differential  Start pulse dose terbinafine to be taken for 7 days every other month for 7 months (Take for 7 days at the beginnings of months 1, 3, 5, and 7; dispense 28 tablets).    Terbinafine is an anti-fungal medicine that can be applied to the skin (over the counter) or taken by mouth (prescription) to treat fungal infections. The pill version is often used to treat fungal infections of the nails or scalp. While most people do not have any side effects from taking terbinafine pills, some possible side effects of the medicine can include taste changes, headache, loss of smell, vision changes, nausea, vomiting, or diarrhea.    Rare side effects can include irritation of the liver, allergic reaction, or decrease in blood counts (which may show up as not feeling well or developing an infection). If you are concerned about any of these side effects, please stop the medicine and call your doctor, or in the Cole of an emergency such as feeling very unwell, seek immediate medical care.   Reordered Medications terbinafine (LAMISIL) 250 MG tablet  Return for 12:45 nurse visit to apply numbing to face to treat sebaceous hyperplasia with Dr. Neale Burly at 1:30.  Anise Salvo, RMA, am acting as scribe for Darden Dates, MD .  Documentation: I have reviewed the above documentation for accuracy and completeness, and I agree with the above.  Darden Dates, MD

## 2020-02-15 NOTE — Patient Instructions (Addendum)
Take terbinafine 250mg  1 daily for 1st week in January, 1st week in March. 1st week in May, 1st week in July as directed.  Terbinafine Counseling  Terbinafine is an anti-fungal medicine that can be applied to the skin (over the counter) or taken by mouth (prescription) to treat fungal infections. The pill version is often used to treat fungal infections of the nails or scalp. While most people do not have any side effects from taking terbinafine pills, some possible side effects of the medicine can include taste changes, headache, loss of smell, vision changes, nausea, vomiting, or diarrhea.   Rare side effects can include irritation of the liver, allergic reaction, or decrease in blood counts (which may show up as not feeling well or developing an infection). If you are concerned about any of these side effects, please stop the medicine and call your doctor, or in the case of an emergency such as feeling very unwell, seek immediate medical care.

## 2020-02-17 ENCOUNTER — Ambulatory Visit (INDEPENDENT_AMBULATORY_CARE_PROVIDER_SITE_OTHER): Payer: 59 | Admitting: Psychology

## 2020-02-17 DIAGNOSIS — F331 Major depressive disorder, recurrent, moderate: Secondary | ICD-10-CM

## 2020-02-20 ENCOUNTER — Encounter: Payer: Self-pay | Admitting: Dermatology

## 2020-02-20 MED FILL — DEXILANT DR 60 MG CAPSULE: 60 | 30 days supply | Qty: 30 | Fill #10

## 2020-02-21 MED FILL — TERBINAFINE HCL 250 MG TABS: 250 | 7 days supply | Qty: 28 | Fill #0

## 2020-02-22 ENCOUNTER — Telehealth: Payer: Self-pay

## 2020-02-22 NOTE — Telephone Encounter (Signed)
Fax from the pharmacy: "FYI pt is also on Fluoxetine and Propranolol. Terbinafine can increase inhibiting metabolism. Patient reports follow up in 2-3 weeks. Please evaluate at this time and call us with questions."  Wanted to make you aware.

## 2020-02-23 ENCOUNTER — Encounter: Payer: Self-pay | Admitting: Dermatology

## 2020-02-23 NOTE — Telephone Encounter (Signed)
Thank you. There was no alert of interaction when prescription sent in. Spoke with patient. Given risk of interaction will recommend therapy with alternate topical medication. She has previously failed Penlac. Would recommend trial of kerydin. Left voicemail requesting call back.

## 2020-02-23 NOTE — Telephone Encounter (Signed)
Spoke with patient. She is no longer on fluoxetine. She takes propranolol low dose for a tremor and would prefer to stop taking this rather than use a topical antifungal instead of systemic given previous failure of topical antifungal. Advised it is ok to take the propranolol in between pulses if she waits AT LEAST 24 HOURS after her last dose of propranolol BEFORE TAKING TERBINAFINE and waits AT LEAST 10 DAYS after her last dose of terbinafine BEFORE RESTARTING PROPRANOLOL. She voices understanding and would like to continue with po terbinafine therapy and also take her propranolol for tremor between pulses. A copy of these instructions was sent to her in a [employer].

## 2020-02-27 ENCOUNTER — Encounter: Payer: Self-pay | Admitting: Dermatology

## 2020-02-28 ENCOUNTER — Other Ambulatory Visit: Payer: 59

## 2020-02-29 ENCOUNTER — Other Ambulatory Visit: Payer: 59

## 2020-02-29 ENCOUNTER — Ambulatory Visit: Payer: 59 | Admitting: Psychology

## 2020-02-29 ENCOUNTER — Other Ambulatory Visit: Payer: Self-pay

## 2020-02-29 ENCOUNTER — Telehealth: Payer: Self-pay

## 2020-02-29 DIAGNOSIS — L603 Nail dystrophy: Secondary | ICD-10-CM

## 2020-02-29 DIAGNOSIS — B351 Tinea unguium: Secondary | ICD-10-CM

## 2020-02-29 MED ORDER — FLUCONAZOLE 150 MG PO TABS: ORAL_TABLET | ORAL | 0 refills | Status: DC

## 2020-02-29 NOTE — Telephone Encounter (Signed)
Left message for patient that we sent in fluconazole 150mg  and pt to take 1 by mouth once weekly, will check labs in 1 month. Order for CMP and CBC/diff sent in, JS

## 2020-03-01 ENCOUNTER — Encounter: Payer: Self-pay | Admitting: Family Medicine

## 2020-03-01 MED FILL — FLUCONAZOLE 150 MG TABS: 150 | 35 days supply | Qty: 5 | Fill #0

## 2020-03-09 ENCOUNTER — Other Ambulatory Visit: Payer: Self-pay | Admitting: Psychiatry

## 2020-03-09 MED FILL — BUPROPION HCL SR 200 MG TAB: 200 | 90 days supply | Qty: 90 | Fill #0

## 2020-03-12 ENCOUNTER — Other Ambulatory Visit: Payer: Self-pay | Admitting: Psychiatry

## 2020-03-12 MED ORDER — DIVALPROEX SODIUM ER 500 MG PO TB24: 1000.0000 mg | ORAL_TABLET | Freq: Every day | ORAL | 0 refills | Status: DC

## 2020-03-13 DIAGNOSIS — G8929 Other chronic pain: Secondary | ICD-10-CM | POA: Insufficient documentation

## 2020-03-13 MED FILL — DIVALPROEX SOD ER 500 MG TA: 500 | 90 days supply | Qty: 180 | Fill #0

## 2020-03-14 ENCOUNTER — Ambulatory Visit: Payer: 59 | Admitting: Psychology

## 2020-03-28 ENCOUNTER — Ambulatory Visit (INDEPENDENT_AMBULATORY_CARE_PROVIDER_SITE_OTHER): Payer: 59 | Admitting: Psychology

## 2020-03-28 DIAGNOSIS — F331 Major depressive disorder, recurrent, moderate: Secondary | ICD-10-CM

## 2020-03-29 ENCOUNTER — Encounter: Payer: Self-pay | Admitting: Dermatology

## 2020-03-30 MED FILL — DEXLANSOPRAZOLE 60 MG CPDR: 60 | 30 days supply | Qty: 30 | Fill #11

## 2020-04-02 ENCOUNTER — Other Ambulatory Visit: Payer: Self-pay | Admitting: Psychiatry

## 2020-04-02 DIAGNOSIS — F314 Bipolar disorder, current episode depressed, severe, without psychotic features: Secondary | ICD-10-CM

## 2020-04-03 ENCOUNTER — Other Ambulatory Visit: Payer: Self-pay

## 2020-04-03 MED FILL — MODAFINIL 200 MG TABLET: 200 | 90 days supply | Qty: 135 | Fill #0

## 2020-04-04 ENCOUNTER — Ambulatory Visit: Payer: 59 | Admitting: Dermatology

## 2020-04-05 DIAGNOSIS — L603 Nail dystrophy: Secondary | ICD-10-CM | POA: Diagnosis not present

## 2020-04-06 LAB — CBC WITH DIFFERENTIAL/PLATELET
Basophils Absolute: 0.1 10*3/uL (ref 0.0–0.2)
Basos: 1 %
EOS (ABSOLUTE): 0.1 10*3/uL (ref 0.0–0.4)
Eos: 2 %
Hematocrit: 41.4 % (ref 34.0–46.6)
Hemoglobin: 13.7 g/dL (ref 11.1–15.9)
Immature Grans (Abs): 0.1 10*3/uL (ref 0.0–0.1)
Immature Granulocytes: 1 %
Lymphocytes Absolute: 2.5 10*3/uL (ref 0.7–3.1)
Lymphs: 36 %
MCH: 28 pg (ref 26.6–33.0)
MCHC: 33.1 g/dL (ref 31.5–35.7)
MCV: 85 fL (ref 79–97)
Monocytes Absolute: 0.4 10*3/uL (ref 0.1–0.9)
Monocytes: 6 %
Neutrophils Absolute: 3.8 10*3/uL (ref 1.4–7.0)
Neutrophils: 54 %
Platelets: 289 10*3/uL (ref 150–450)
RBC: 4.89 x10E6/uL (ref 3.77–5.28)
RDW: 15 % (ref 11.7–15.4)
WBC: 6.9 10*3/uL (ref 3.4–10.8)

## 2020-04-06 LAB — COMPREHENSIVE METABOLIC PANEL
ALT: 17 IU/L (ref 0–32)
AST: 16 IU/L (ref 0–40)
Albumin/Globulin Ratio: 1.8 (ref 1.2–2.2)
Albumin: 4.6 g/dL (ref 3.8–4.9)
Alkaline Phosphatase: 77 IU/L (ref 44–121)
BUN/Creatinine Ratio: 17 (ref 12–28)
BUN: 14 mg/dL (ref 8–27)
Bilirubin Total: <0.2 mg/dL (ref 0.0–1.2)
CO2: 23 mmol/L (ref 20–29)
Calcium: 9.4 mg/dL (ref 8.7–10.3)
Chloride: 105 mmol/L (ref 96–106)
Creatinine, Ser: 0.84 mg/dL (ref 0.57–1.00)
GFR calc Af Amer: 87 mL/min/{1.73_m2} (ref >59)
GFR calc non Af Amer: 76 mL/min/{1.73_m2} (ref >59)
Globulin, Total: 2.6 g/dL (ref 1.5–4.5)
Glucose: 97 mg/dL (ref 65–99)
Potassium: 4 mmol/L (ref 3.5–5.2)
Sodium: 145 mmol/L — ABNORMAL HIGH (ref 134–144)
Total Protein: 7.2 g/dL (ref 6.0–8.5)

## 2020-04-09 ENCOUNTER — Encounter: Payer: Self-pay | Admitting: Dermatology

## 2020-04-09 ENCOUNTER — Other Ambulatory Visit: Payer: Self-pay

## 2020-04-09 ENCOUNTER — Telehealth: Payer: Self-pay

## 2020-04-09 DIAGNOSIS — L603 Nail dystrophy: Secondary | ICD-10-CM

## 2020-04-09 MED ORDER — FLUCONAZOLE 150 MG PO TABS: ORAL_TABLET | ORAL | 5 refills | Status: DC

## 2020-04-09 NOTE — Telephone Encounter (Signed)
Left msg for pt labs ok, sending in refills of fluconazole, JS

## 2020-04-09 NOTE — Telephone Encounter (Signed)
-----   Message from Alfonso Patten, MD sent at 04/09/2020 10:43 AM EST ----- Labs ok, continue weekly fluconazole for onychomycosis. OK to send 5 months of refills. Thank you!

## 2020-04-10 MED FILL — FLUCONAZOLE 150 MG TABS: 150 | 28 days supply | Qty: 4 | Fill #0

## 2020-04-11 ENCOUNTER — Ambulatory Visit (INDEPENDENT_AMBULATORY_CARE_PROVIDER_SITE_OTHER): Payer: 59 | Admitting: Psychology

## 2020-04-11 DIAGNOSIS — F331 Major depressive disorder, recurrent, moderate: Secondary | ICD-10-CM

## 2020-04-19 ENCOUNTER — Telehealth: Payer: 59 | Admitting: Psychiatry

## 2020-04-24 ENCOUNTER — Other Ambulatory Visit: Payer: Self-pay | Admitting: Family Medicine

## 2020-04-24 ENCOUNTER — Encounter: Payer: Self-pay | Admitting: Family Medicine

## 2020-04-24 ENCOUNTER — Other Ambulatory Visit: Payer: Self-pay

## 2020-04-24 ENCOUNTER — Ambulatory Visit (INDEPENDENT_AMBULATORY_CARE_PROVIDER_SITE_OTHER): Payer: 59 | Admitting: Family Medicine

## 2020-04-24 VITALS — BP 128/78 | HR 74 | Temp 98.4°F | Resp 18 | Ht 69.0 in | Wt 183.6 lb

## 2020-04-24 DIAGNOSIS — M7552 Bursitis of left shoulder: Secondary | ICD-10-CM | POA: Diagnosis not present

## 2020-04-24 DIAGNOSIS — N952 Postmenopausal atrophic vaginitis: Secondary | ICD-10-CM | POA: Diagnosis not present

## 2020-04-24 DIAGNOSIS — R251 Tremor, unspecified: Secondary | ICD-10-CM | POA: Diagnosis not present

## 2020-04-24 DIAGNOSIS — Z0000 Encounter for general adult medical examination without abnormal findings: Secondary | ICD-10-CM | POA: Diagnosis not present

## 2020-04-24 DIAGNOSIS — J302 Other seasonal allergic rhinitis: Secondary | ICD-10-CM | POA: Diagnosis not present

## 2020-04-24 DIAGNOSIS — I10 Essential (primary) hypertension: Secondary | ICD-10-CM

## 2020-04-24 MED ORDER — FEXOFENADINE HCL 180 MG PO TABS: 180.0000 mg | ORAL_TABLET | Freq: Every day | ORAL | Status: AC

## 2020-04-24 MED ORDER — PREDNISONE 20 MG PO TABS: 40.0000 mg | ORAL_TABLET | Freq: Every day | ORAL | 0 refills | Status: DC

## 2020-04-24 MED ORDER — NONFORMULARY OR COMPOUNDED ITEM: 0 refills | Status: DC

## 2020-04-24 MED ORDER — TRIMETHOPRIM 100 MG PO TABS: ORAL_TABLET | ORAL | 2 refills | Status: DC

## 2020-04-24 MED ORDER — PROPRANOLOL HCL 20 MG PO TABS
20.0000 mg | ORAL_TABLET | Freq: Two times a day (BID) | ORAL | 3 refills | Status: DC
Start: 2020-04-24 — End: 2020-04-24

## 2020-04-24 NOTE — Patient Instructions (Signed)
Preventive Care 84-61 Years Old, Female Preventive care refers to lifestyle choices and visits with your health care provider that can promote health and wellness. This includes:  A yearly physical exam. This is also called an annual wellness visit.  Regular dental and eye exams.  Immunizations.  Screening for certain conditions.  Healthy lifestyle choices, such as: ? Eating a healthy diet. ? Getting regular exercise. ? Not using drugs or products that contain nicotine and tobacco. ? Limiting alcohol use. What can I expect for my preventive care visit? Physical exam Your health care provider will check your:  Height and weight. These may be used to calculate your BMI (body mass index). BMI is a measurement that tells if you are at a healthy weight.  Heart rate and blood pressure.  Body temperature.  Skin for abnormal spots. Counseling Your health care provider may ask you questions about your:  Past medical problems.  Family's medical history.  Alcohol, tobacco, and drug use.  Emotional well-being.  Home life and relationship well-being.  Sexual activity.  Diet, exercise, and sleep habits.  Work and work Statistician.  Access to firearms.  Method of birth control.  Menstrual cycle.  Pregnancy history. What immunizations do I need? Vaccines are usually given at various ages, according to a schedule. Your health care provider will recommend vaccines for you based on your age, medical history, and lifestyle or other factors, such as travel or where you work.   What tests do I need? Blood tests  Lipid and cholesterol levels. These may be checked every 5 years, or more often if you are over 3 years old.  Hepatitis C test.  Hepatitis B test. Screening  Lung cancer screening. You may have this screening every year starting at age 73 if you have a 30-pack-year history of smoking and currently smoke or have quit within the past 15 years.  Colorectal cancer  screening. ? All adults should have this screening starting at age 52 and continuing until age 17. ? Your health care provider may recommend screening at age 49 if you are at increased risk. ? You will have tests every 1-10 years, depending on your results and the type of screening test.  Diabetes screening. ? This is done by checking your blood sugar (glucose) after you have not eaten for a while (fasting). ? You may have this done every 1-3 years.  Mammogram. ? This may be done every 1-2 years. ? Talk with your health care provider about when you should start having regular mammograms. This may depend on whether you have a family history of breast cancer.  BRCA-related cancer screening. This may be done if you have a family history of breast, ovarian, tubal, or peritoneal cancers.  Pelvic exam and Pap test. ? This may be done every 3 years starting at age 10. ? Starting at age 11, this may be done every 5 years if you have a Pap test in combination with an HPV test. Other tests  STD (sexually transmitted disease) testing, if you are at risk.  Bone density scan. This is done to screen for osteoporosis. You may have this scan if you are at high risk for osteoporosis. Talk with your health care provider about your test results, treatment options, and if necessary, the need for more tests. Follow these instructions at home: Eating and drinking  Eat a diet that includes fresh fruits and vegetables, whole grains, lean protein, and low-fat dairy products.  Take vitamin and mineral supplements  as recommended by your health care provider.  Do not drink alcohol if: ? Your health care provider tells you not to drink. ? You are pregnant, may be pregnant, or are planning to become pregnant.  If you drink alcohol: ? Limit how much you have to 0-1 drink a day. ? Be aware of how much alcohol is in your drink. In the [place] one drink equals one 12 oz bottle of beer (355 mL), one 5 oz glass of  wine (148 mL), or one 1 oz glass of hard liquor (44 mL).   Lifestyle  Take daily care of your teeth and gums. Brush your teeth every morning and night with fluoride toothpaste. Floss one time each day.  Stay active. Exercise for at least 30 minutes 5 or more days each week.  Do not use any products that contain nicotine or tobacco, such as cigarettes, e-cigarettes, and chewing tobacco. If you need help quitting, ask your health care provider.  Do not use drugs.  If you are sexually active, practice safe sex. Use a condom or other form of protection to prevent STIs (sexually transmitted infections).  If you do not wish to become pregnant, use a form of birth control. If you plan to become pregnant, see your health care provider for a prepregnancy visit.  If told by your health care provider, take low-dose aspirin daily starting at age 50.  Find healthy ways to cope with stress, such as: ? Meditation, yoga, or listening to music. ? Journaling. ? Talking to a trusted person. ? Spending time with friends and family. Safety  Always wear your seat belt while driving or riding in a vehicle.  Do not drive: ? If you have been drinking alcohol. Do not ride with someone who has been drinking. ? When you are tired or distracted. ? While texting.  Wear a helmet and other protective equipment during sports activities.  If you have firearms in your house, make sure you follow all gun safety procedures. What's next?  Visit your health care provider once a year for an annual wellness visit.  Ask your health care provider how often you should have your eyes and teeth checked.  Stay up to date on all vaccines. This information is not intended to replace advice given to you by your health care provider. Make sure you discuss any questions you have with your health care provider. Document Revised: 11/01/2019 Document Reviewed: 10/08/2017 Elsevier Patient Education  2021 Elsevier Inc.  

## 2020-04-24 NOTE — Progress Notes (Signed)
Subjective:     Beverly Cole is a 60 y.o. female and is here for a comprehensive physical exam. The patient reports problems - L shoulder pain -- no known injury --- she states it feels like bursitis . She is seeing psych regularly for her depression  Social History   Socioeconomic History  . Marital status: Married    Spouse name: Not on file  . Number of children: 0  . Years of education: Not on file  . Highest education level: Not on file  Occupational History  . Occupation: Nurse-Personal Care/HH    Employer: UNEMPLOYED  Tobacco Use  . Smoking status: Former Smoker    Packs/day: 1.00    Years: 25.00    Pack years: 25.00    Types: Cigarettes    Quit date: 02/11/1996    Years since quitting: 24.2  . Smokeless tobacco: Never Used  . Tobacco comment: Married, lives with spouse. 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 Vaping Use: Never used  Substance and Sexual Activity  . Alcohol use: Yes    Comment: occ  . Drug use: No  . Sexual activity: Yes    Partners: Male    Birth control/protection: Post-menopausal  Other Topics Concern  . Not on file  Social History Narrative   Married, lives with spouse in [place]. 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 COVID-19 Vaccine (3 - Pfizer risk 4-dose series) 06/21/2019  . MAMMOGRAM  08/03/2020  . TETANUS/TDAP  05/12/2021  . PAP SMEAR-Modifier  01/19/2022  . COLONOSCOPY (Pts 45-95yrs Insurance coverage will need to be confirmed)  11/27/2025  . INFLUENZA VACCINE  Completed  . Hepatitis C Screening  Completed  . HIV Screening  Completed  . HPV  VACCINES  Aged Out    The following portions of the patient's history were reviewed and updated as appropriate:  She  has a past medical history of Anxiety, Atrial fibrillation ([place]), BIPOLAR DISORDER UNSPECIFIED, Bradycardia, Bursitis of hip (09/1999), DEPRESSION, Emphysema lung ([place]) (06/20/2016), GERD, Headache(784.0), HYPERLIPIDEMIA, Hypertension, Hypertension, IRRITABLE BOWEL SYNDROME, HX OF, Menopausal state (01/2012), Mitral valve prolapse (12/17/2000), MYALGIA, Normal coronary arteries, PAT (paroxysmal atrial tachycardia) ([place]), PE (pulmonary embolism) (02/09/2015), Premature atrial contractions, Pulmonary embolus ([place]) (02/10/2015), Shortness of breath, Thyroid nodule (2014), and Tremors of nervous system. She does not have any pertinent problems on file. She  has a past surgical history that includes Lipoma (R) side (1990); Colonoscopy; and laparoscopic appendectomy (N/A, 03/17/2017). Her family history includes Cancer in her brother and father; Colon cancer in her paternal uncle; Depression in her father; Hyperlipidemia in her father and mother; Hypertension in her mother; Lung cancer in her mother; Pulmonary embolism in her brother; Sarcoidosis in an other family member; Testicular cancer in an other family member. She  reports that she quit smoking about 24 years ago. Her smoking use included cigarettes. She has a 25.00 pack-year smoking history. She has never used smokeless tobacco. She reports current alcohol use. She reports that she does not use drugs. She has  a current medication list which includes the following prescription(s): alprazolam, aspirin ec, bupropion, dexilant, diltiazem, divalproex, fexofenadine, fluconazole, fluticasone, ketoconazole, modafinil, NONFORMULARY OR COMPOUNDED ITEM, NONFORMULARY OR COMPOUNDED ITEM, prednisone, probiotic product, tretinoin, propranolol, and trimethoprim. Current Outpatient Medications on File Prior to Visit  Medication Sig Dispense Refill  .  ALPRAZolam (XANAX) 0.5 MG tablet Take 1 tablet (0.5 mg total) by mouth 2 (two) times daily as needed for anxiety. 1 po 30 min prior to procedure 30 tablet 0  . aspirin EC 325 MG tablet Take 325 mg by mouth daily.    Marland Kitchen buPROPion (WELLBUTRIN SR) 200 MG 12 hr tablet TAKE 1 TABLET BY MOUTH DAILY IN THE MORNING 90 tablet 0  . DEXILANT 60 MG capsule Take 1 capsule (60 mg total) by mouth daily. 30 capsule 12  . diltiazem (CARTIA XT) 180 MG 24 hr capsule Take 1 capsule (180 mg total) by mouth daily. 90 capsule 1  . divalproex (DEPAKOTE ER) 500 MG 24 hr tablet Take 2 tablets (1,000 mg total) by mouth at bedtime. 180 tablet 0  . fluconazole (DIFLUCAN) 150 MG tablet Take 1 by mouth once weekly as directed. 4 tablet 5  . fluticasone (FLONASE) 50 MCG/ACT nasal spray Place 2 sprays into the nose daily as needed. For allergies    . ketoconazole (NIZORAL) 2 % cream Apply 1 application topically daily. 15 g 1  . modafinil (PROVIGIL) 200 MG tablet Take 1.5 tablets (300 mg total) by mouth in the morning. 135 tablet 0  . NONFORMULARY OR COMPOUNDED ITEM sm Azelaic acid/ metronidazole/ivermectin 15%/1%/1% cream apply to face bid 1 each 5  . Probiotic Product (PROBIOTIC DAILY PO) Take 1 tablet by mouth daily.     Marland Kitchen tretinoin (RETIN-A) 0.025 % cream as needed.   4   No current facility-administered medications on file prior to visit.   She is allergic to cariprazine, metoprolol, and nickel..  Review of Systems Review of Systems  Constitutional: Negative for activity change, appetite change and fatigue.  HENT: Negative for hearing loss, congestion, tinnitus and ear discharge.  dentist q72m Eyes: Negative for visual disturbance (see optho q1y -- vision corrected to 20/20 with glasses).  Respiratory: Negative for cough, chest tightness and shortness of breath.   Cardiovascular: Negative for chest pain, palpitations and leg swelling.  Gastrointestinal: Negative for abdominal pain, diarrhea, constipation and abdominal  distention.  Genitourinary: Negative for urgency, frequency, decreased urine volume and difficulty urinating.  Musculoskeletal:walking with cane,  L shoulder pain  Skin: Negative for color change, pallor and rash.  Neurological: Negative for dizziness, light-headedness, numbness and headaches.  Hematological: Negative for adenopathy. Does not bruise/bleed easily.  Psychiatric/Behavioral: Negative for suicidal ideas, confusion, sleep disturbance, self-injury,  + depression     Objective:    BP 128/78 (BP Location: Right Arm, Patient Position: Sitting, Cuff Size: Normal)   Pulse 74   Temp 98.4 F (36.9 C) (Oral)   Resp 18   Ht 5\' 9"  (1.753 m)   Wt 183 lb 9.6 oz (83.3 kg)   LMP 05/11/2012   SpO2 96%   BMI 27.11 kg/m  General appearance: alert, cooperative, appears stated age and no distress Head: Normocephalic, without obvious abnormality, atraumatic Eyes: conjunctivae/corneas clear. PERRL, EOM's intact. Fundi benign. Throat: lips, mucosa, and tongue normal; teeth and gums normal Neck: no adenopathy, no carotid bruit, no JVD, supple, symmetrical, trachea midline and thyroid not enlarged, symmetric, no tenderness/mass/nodules Back: symmetric, no curvature. ROM normal. No CVA tenderness. Lungs: clear to auscultation bilaterally Breasts:  gyn Heart: regular rate and rhythm, S1, S2 normal, no murmur, click, rub or gallop Abdomen: soft, non-tender; bowel sounds normal; no masses,  no organomegaly Pelvic: deferred -gyn Extremities: extremities normal, atraumatic, no cyanosis or edema--- L shoulder pain with rom  Pulses: 2+ and symmetric Skin: Skin color, texture, turgor normal. No rashes or lesions Lymph nodes: Cervical, supraclavicular, and axillary nodes normal. Neurologic: Alert and oriented X 3, normal strength and tone. Normal symmetric reflexes. Normal coordination and gait    Assessment:    Healthy female exam.      Plan:    ghm utd Check labs  See After Visit Summary  for Counseling Recommendations    1. Vaginal atrophy Per gyn - trimethoprim (TRIMPEX) 100 MG tablet; TAKE AFTER INTERCOURSE AS DIRECTED  Dispense: 30 tablet; Refill: 2  2. Tremor Stable  - propranolol (INDERAL) 20 MG tablet; Take 1 tablet (20 mg total) by mouth 2 (two) times daily.  Dispense: 180 tablet; Refill: 3  3. Preventative health care See above  - NONFORMULARY OR COMPOUNDED ITEM; Cmp, cbcd, tsh, lipid Dx htn, preventative health  Dispense: 1 each; Refill: 0  4. Primary hypertension Well controlled, no changes to meds. Encouraged heart healthy diet such as the DASH diet and exercise as tolerated.   - NONFORMULARY OR COMPOUNDED ITEM; Cmp, cbcd, tsh, lipid Dx htn, preventative health  Dispense: 1 each; Refill: 0  5. Seasonal allergies stable - fexofenadine (ALLEGRA ALLERGY) 180 MG tablet; Take 1 tablet (180 mg total) by mouth daily.  6. Bursitis of left shoulder F/u ortho if no relief with pred taper  - predniSONE (DELTASONE) 20 MG tablet; Take 2 tablets (40 mg total) by mouth daily.  Dispense: 10 tablet; Refill: 0

## 2020-04-25 ENCOUNTER — Ambulatory Visit: Payer: 59 | Admitting: Psychology

## 2020-04-30 ENCOUNTER — Other Ambulatory Visit: Payer: Self-pay | Admitting: Family Medicine

## 2020-04-30 DIAGNOSIS — K219 Gastro-esophageal reflux disease without esophagitis: Secondary | ICD-10-CM

## 2020-04-30 MED FILL — DEXLANSOPRAZOLE 60 MG CPDR: 60 | 30 days supply | Qty: 30 | Fill #0

## 2020-05-02 ENCOUNTER — Other Ambulatory Visit: Payer: Self-pay

## 2020-05-02 ENCOUNTER — Telehealth: Payer: Self-pay | Admitting: Family Medicine

## 2020-05-02 DIAGNOSIS — L719 Rosacea, unspecified: Secondary | ICD-10-CM

## 2020-05-02 NOTE — Telephone Encounter (Signed)
Medication: Azelaic acid/ metronidazole/ivermectin 15%/1%/1% cream apply to face bid  Has the patient contacted their pharmacy? No. (If no, request that the patient contact the pharmacy for the refill.) (If yes, when and what did the pharmacy advise?)  Preferred Pharmacy (with phone number or street name):  [address] [facility] [address] [place] [address] [place] [phone] [facility] @ [phone]  Agent: Please be advised that RX refills may take up to 3 business days. We ask that you follow-up with your pharmacy.

## 2020-05-03 ENCOUNTER — Ambulatory Visit (INDEPENDENT_AMBULATORY_CARE_PROVIDER_SITE_OTHER): Payer: 59 | Admitting: Psychology

## 2020-05-03 DIAGNOSIS — F331 Major depressive disorder, recurrent, moderate: Secondary | ICD-10-CM | POA: Diagnosis not present

## 2020-05-03 MED ORDER — NONFORMULARY OR COMPOUNDED ITEM: 5 refills | Status: DC

## 2020-05-03 NOTE — Telephone Encounter (Signed)
Left message on machine that rx was faxed in.

## 2020-05-14 ENCOUNTER — Other Ambulatory Visit (HOSPITAL_COMMUNITY): Payer: Self-pay

## 2020-05-14 MED FILL — Diltiazem HCl Coated Beads Cap ER 24HR 180 MG: ORAL | 90 days supply | Qty: 90 | Fill #0 | Status: AC

## 2020-05-16 ENCOUNTER — Ambulatory Visit (INDEPENDENT_AMBULATORY_CARE_PROVIDER_SITE_OTHER): Payer: 59 | Admitting: Psychology

## 2020-05-16 DIAGNOSIS — F331 Major depressive disorder, recurrent, moderate: Secondary | ICD-10-CM

## 2020-05-17 ENCOUNTER — Other Ambulatory Visit: Payer: Self-pay | Admitting: Psychiatry

## 2020-05-17 ENCOUNTER — Telehealth: Payer: Self-pay | Admitting: Psychiatry

## 2020-05-17 NOTE — Telephone Encounter (Signed)
Pt called requesting to start Zoloft again. Stated was on a med from dermotolist that conflicted with Zoloft. Not taking that med anymore. Pharmacy [facility]. Apt 5/2

## 2020-05-17 NOTE — Telephone Encounter (Signed)
Her last visit in November we switched from sertraline to fluoxetine and she has not been seen since.  She cannot take both.  Is she taking fluoxetine?  If not why did she stop it?  If she's off fluoxetine and wants to restart sertraline, please ask what sx she thinks it helped and then she can restart sertraline 100 mg tablet 1/2 tablet for 1 week then 1 tablet daily. She hasn't kept appts and needs to keep the one in May.

## 2020-05-17 NOTE — Telephone Encounter (Signed)
Zoloft was discontinued end of last year due to interaction with Rx from dermatologist. Please advise

## 2020-05-21 NOTE — Telephone Encounter (Signed)
Pt has apt tomorrow, can be discussed further

## 2020-05-22 ENCOUNTER — Other Ambulatory Visit (HOSPITAL_COMMUNITY): Payer: Self-pay

## 2020-05-22 ENCOUNTER — Telehealth (INDEPENDENT_AMBULATORY_CARE_PROVIDER_SITE_OTHER): Payer: 59 | Admitting: Psychiatry

## 2020-05-22 ENCOUNTER — Encounter: Payer: Self-pay | Admitting: Psychiatry

## 2020-05-22 DIAGNOSIS — F314 Bipolar disorder, current episode depressed, severe, without psychotic features: Secondary | ICD-10-CM

## 2020-05-22 DIAGNOSIS — F411 Generalized anxiety disorder: Secondary | ICD-10-CM

## 2020-05-22 DIAGNOSIS — F4001 Agoraphobia with panic disorder: Secondary | ICD-10-CM | POA: Diagnosis not present

## 2020-05-22 DIAGNOSIS — F423 Hoarding disorder: Secondary | ICD-10-CM

## 2020-05-22 DIAGNOSIS — F40240 Claustrophobia: Secondary | ICD-10-CM | POA: Diagnosis not present

## 2020-05-22 MED ORDER — SERTRALINE HCL 50 MG PO TABS
ORAL_TABLET | ORAL | 0 refills | Status: DC
  Filled 2020-05-22: qty 135, 90d supply, fill #0

## 2020-05-22 NOTE — Progress Notes (Signed)
Beverly Cole [id number] Sep 11, 1960 60 y.o.  Video Visit via My Chart  I connected with pt by My Chart and verified that I am speaking with the correct person using two identifiers.   I discussed the limitations, risks, security and privacy concerns of performing an evaluation and management service by My Chart  and the availability of in person appointments. I also discussed with the patient that there may be a patient responsible charge related to this service. The patient expressed understanding and agreed to proceed.  I discussed the assessment and treatment plan with the patient. The patient was provided an opportunity to ask questions and all were answered. The patient agreed with the plan and demonstrated an understanding of the instructions.   The patient was advised to call back or seek an in-person evaluation if the symptoms worsen or if the condition fails to improve as anticipated.  I provided 30 minutes of video time during this encounter.  The patient was located at home and the provider was located office. Session started 345 and ended 415  Subjective:   Patient ID:  Beverly Cole is a 60 y.o. (DOB 1960-04-26) female.  Chief Complaint:  Chief Complaint  Patient presents with  . Follow-up  . Bipolar disorder with severe depression ([place])  . Depression  . Memory Loss    Depression        Associated symptoms include decreased concentration and fatigue.  Associated symptoms include no suicidal ideas.  Past medical history includes anxiety.   Anxiety Symptoms include decreased concentration, dizziness, nervous/anxious behavior and shortness of breath. Patient reports no chest pain, confusion or suicidal ideas.     [name]  today for follow-up of chronic depression and anxiety.  Lately depression worse than anxiety.  Seen with husband today  When seen May 11, 2018.  For persistent anxiety we elected to retry buspirone and try increasing it to 30 mg twice daily  if tolerated.   Couldn't tolerate it this time DT muscle spasms.  Pharmacy called saying she could have serotonin syndrome.  When seen August 09, 2018 and she refused med changes despite chronic anxiety and depression. She remained on sertraline 50, Depakote ER 1000 mg, Wellbutrin SR 200 mg AM  Panic wearing cloth masks.  Using a shield.  Not in the office today bc H exposed to Covid.    seen December 09, 2018.  Because of chronic depression and dysfunction with fatigue and poor motivation we decided off label trial  trial of modafinil 200 mg 1/2 each am for 1 week then 200 mg each AM. Did see benefit from it.  More energy and working to stay out of bed more.    seen January 10, 2019.  The modafinil had been helpful.  No meds were changed.  I seen April 07, 2019.  The following was noted: Still sees benefit from modafinil but still not caring for herself like she should.  Not wanting to go anywhere.  Not driven since early fall.  Hard to breathe with the mask and it creates anxiety.  Hard to be in enclosed spaces like cars and planes for extended periods.   Trying to set her alarm clock.  No hallucinations.  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  Plans to send more too.  Still follow through is not great.  Still depression and productivity is still poor but it is not as  poor as it was before modafinil. Plan increase modafinil to 300 mg daily to see if function can be improved.  06/29/2019 appointment, the following is noted: Rare Xanax.  Did increase modafinil to 300 mg daily. Saw benefit with the increase with less time in bed but still markedly functionally impaired.   It seems like I get used to it and then doesn't maintain her get up and go.  Still having a hard time with self care like hygiene. Chronic low motivation and lack of interest is unchanged.   Gall bladder problems.  It's better at the moment.    Not more  sad, just like I've always been.  Can't make herself leave the house DT depression and anxiety.  She won't do chores.   Less excess sleeping.  He works from home 2 days weekly.  No periods of hyperactivity or manic sx since the spring. Attending therapy every 2 weeks.   Can be confused when first wakes up regardless of time of day. Had episode of hallucination in the middle of the night when awakened. Scarecrow frightened her. This is rare event.  No hallucinations during the day.  Melatonin makes it worse. Has had fearful thoughts of planes crashing or bad things happening to others and it might be her fault.  Fight with brother lately and not speaking with him now. Plan:  No med changes  10/11/19 appt with the following noted: Unsteady and tremors gotten worse.  Not gone to doctor. Propranolol not helping as much.   On Depakote ER 1000mg  HS which is a reduction. So tired all the time and H says she's not doing anything.  H says accomplishing littte and still lays in bed a lot.  H says it's 2-3 PM before she gets OOB.  Memory is poor.  Everything is such a chore and doesn't want to do things. H says she starts things she doesn't finish.  Lots of new projects.   Increase modafinil on her own to 400 mg daily in the AM.  Anxious.  Rare Xanax.  Was happy when got new cats in the summer.  Anxious and Depressed and describes anxiety as Moderate. Anxiety symptoms include: Excessive Worry,.  She thinks sertraline reduces her obsessiveness.  Past hx panic so avoidant. Never get to relax.  Pt reports sleeps excessively even with modafinil. Pt reports that appetite is good. Pt reports that energy is poor and loss of interest or pleasure in usual activities, poor motivation and withdrawn from usual activities. Just don't care about things and admits to a lot of general anxiety and everything takes a lot of energy out of her.  Compulsively tapes and watches TV shows, news and awards shows.  Can't delete it bc I'll  miss something.  Watches TV in bed. Concentration is poor. Suicidal thoughts:  denied by patient. But chronic death thoughts.  Poor productivity overall.  Chronic poor self care, showers only weekly.  Just don't care.  Chronic disability.  She thinks sertraline helped the anxiety some.  Likes that H is at home.  Helps her mood.  Doesn't care about going out. Feels faint when she tries to wear a mask.  Too anxious driving and is avoidant. Collects TV shows, she can't erase unless she watches entirely. Hoards some bc afraid she might need it some day.   Rare use of Xanax bc fears addiction. Plan: Attempted referral to neurology at [facility].  They refused to see patient stating they had nothing to offer  her. It was suggested to patient that she try to get in with Dr. Jannifer Franklin whom she had seen in the past.  01/10/2020 appointment with the following noted: Reduced modafinil to 300 mg bc didn't want to get hooked on anything about 2-3 weeks.  Did initially see benefit from the modafinil but seemed to get tolerant to it. Feels better not talking to brother bc differing views of politics.  Feels he's a negative person but never depressed. Overall thinks she's doing pretty well relatively.  However is chronically depressed and never happy.  Nothing changed with the meds. Depression worse than anxiety. H didn't think modafinil made much difference.  H CO her inactivity.. She felt it was helpful for energy initially. Had to cancel trips bc can't wear a mask for that long.  Makes her sad. Driven twice in a year or so DT anxiety. Wants prn Xanax. Plan: DC sertraline Start fluoxetine 20 mg daily with olanzapine 5 mg daily.  05/22/2020 appointment with following noted: Didn't remember to stop sertraline and took fluoxetine and olanzapine for 2 days and couldn't eat and stopped it. Then went to dermatologist December who told her couldn't take Zoloft with fungus med and stopped sertraline. Then became  more depressed.  I have to go back on the Zoloft. Remained on modafinil, Wellbutrin, Depakote.  Even less motivated to do anything like cook, clean.  Won't go to [employer] etc. Chronic depression with hopelessness to get better  Afraid to try new meds after negative reaction to [place].  Afraid of any med change.  Afraid of having SI and afraid she'd do it if had SI. Hopeless about getting better.  Saw pulmonologist over weakness and he says she's OK except deconditioned.  Can't tolerate wearing the masks.  Past psych med trials: Wellbutrin XL 300, Sertraline, fluoxetine SE brief  Vraylar SI, Latuda, Abilify ,  pramipexole, modafinil,  Seroquel which caused side effects of constipation,   lamotrigine,  refuses lithium because of altered taste.   She has a history of ataxia on 1500 mg of Depakote.   buspirone SE hallucinations ECT 2016 without help,    Review of Systems:  Review of Systems  Constitutional: Positive for fatigue.  Respiratory: Positive for shortness of breath.   Cardiovascular: Negative for chest pain.  Gastrointestinal: Positive for abdominal pain.  Neurological: Positive for dizziness and tremors. Negative for weakness.  Psychiatric/Behavioral: Positive for decreased concentration, depression, dysphoric mood and sleep disturbance. Negative for agitation, behavioral problems, confusion, hallucinations, self-injury and suicidal ideas. The patient is nervous/anxious. The patient is not hyperactive.     Medications: I have reviewed the patient's current medications.  Current Outpatient Medications  Medication Sig Dispense Refill  . ALPRAZolam (XANAX) 0.5 MG tablet TAKE 1 TABLET (0.5 MG TOTAL) BY MOUTH 2 (TWO) TIMES DAILY AS NEEDED FOR ANXIETY. 1 TABLET 30 MINUTES PRIOR TO PROCEDURE 30 tablet 0  . aspirin EC 325 MG tablet Take 325 mg by mouth daily.    Marland Kitchen buPROPion (WELLBUTRIN SR) 200 MG 12 hr tablet TAKE 1 TABLET BY MOUTH DAILY IN THE MORNING 90 tablet 0  .  dexlansoprazole (DEXILANT) 60 MG capsule TAKE 1 CAPSULE (60 MG TOTAL) BY MOUTH DAILY. 90 capsule 3  . diltiazem (CARDIZEM CD) 180 MG 24 hr capsule TAKE 1 CAPSULE (180 MG TOTAL) BY MOUTH DAILY. 90 capsule 1  . divalproex (DEPAKOTE ER) 500 MG 24 hr tablet TAKE 2 TABLETS (1,000 MG TOTAL) BY MOUTH AT BEDTIME. 180 tablet 0  . fexofenadine (ALLEGRA  ALLERGY) 180 MG tablet Take 1 tablet (180 mg total) by mouth daily.    . fluticasone (FLONASE) 50 MCG/ACT nasal spray Place 2 sprays into the nose daily as needed. For allergies    . ketoconazole (NIZORAL) 2 % cream Apply 1 application topically daily. 15 g 1  . modafinil (PROVIGIL) 200 MG tablet TAKE 1 & 1/2 TABLETS BY MOUTH IN THE MORNING. 135 tablet 0  . NONFORMULARY OR COMPOUNDED ITEM Cmp, cbcd, tsh, lipid Dx htn, preventative health 1 each 0  . NONFORMULARY OR COMPOUNDED ITEM sm Azelaic acid/ metronidazole/ivermectin 15%/1%/1% cream apply to face bid 1 each 5  . Probiotic Product (PROBIOTIC DAILY PO) Take 1 tablet by mouth daily.     . propranolol (INDERAL) 20 MG tablet TAKE 1 TABLET (20 MG TOTAL) BY MOUTH 2 (TWO) TIMES DAILY. 180 tablet 3  . tretinoin (RETIN-A) 0.025 % cream as needed.   4  . trimethoprim (TRIMPEX) 100 MG tablet TAKE AFTER INTERCOURSE AS DIRECTED 30 tablet 2  . fluconazole (DIFLUCAN) 150 MG tablet TAKE 1 TABLET BY MOUTH ONCE PER WEEK 4 tablet 5  . predniSONE (DELTASONE) 20 MG tablet TAKE 2 TABLETS (40 MG TOTAL) BY MOUTH DAILY. 10 tablet 0  . sertraline (ZOLOFT) 50 MG tablet 1 daily for 1 week, then 1 and 1/2 tablets daily 135 tablet 0   No current facility-administered medications for this visit.    Medication Side Effects: None  Allergies:  Allergies  Allergen Reactions  . Cariprazine Other (See Comments)    Patient becomes suicidal when taking this medication.  Patient becomes suicidal when taking this medication.   . Metoprolol Other (See Comments)    Hallucinations hair falls out Hallucinations hair falls out  . Nickel  Rash    Past Medical History:  Diagnosis Date  . Anxiety   . Atrial fibrillation ([place])    a. Event monitor 2013 - PACs/bradycardia/SVT/short run of atrial fib by event monitor.   Marland Kitchen BIPOLAR DISORDER UNSPECIFIED   . Bradycardia   . Bursitis of hip 09/1999   Bilateral - Dr. Berenice Primas  . DEPRESSION   . Emphysema lung ([place]) 06/20/2016   pt states pulmonalongist stated started recently  . GERD   . Headache(784.0)    was with menstrual cycle. no longer a problem  . HYPERLIPIDEMIA   . Hypertension   . Hypertension   . IRRITABLE BOWEL SYNDROME, HX OF   . Menopausal state 01/2012   FSH = 88.5  . Mitral valve prolapse 12/17/2000   a. dx 1980s, most recent echo did not demonstrate this.  Marland Kitchen MYALGIA   . Normal coronary arteries    a. by cardiac CT 2013.  Marland Kitchen PAT (paroxysmal atrial tachycardia) ([place])   . PE (pulmonary embolism) 02/09/2015   a. Bilateral PEs 01/2015 when d-dimer 0.69, CP lying on left side. Followed by heme-onc - + lupus anticoagulant, mildly depressed protein S. Dr. Marin Olp is not certain if her hypercoagulable studies are significant for a thrombophilic state.  . Premature atrial contractions   . Pulmonary embolus ([place]) 02/10/2015  . Shortness of breath    Pulmonary eval 05/2002  . Thyroid nodule 2014  . Tremors of nervous system     Family History  Problem Relation Age of Onset  . Lung cancer Mother   . Hypertension Mother   . Hyperlipidemia Mother   . Cancer Father        Esophageal cancer  . Hyperlipidemia Father   . Depression Father   . Cancer  Brother   . Pulmonary embolism Brother   . Colon cancer Paternal Uncle   . Sarcoidosis Other   . Testicular cancer Other     Social History   Socioeconomic History  . Marital status: Married    Spouse name: Not on file  . Number of children: 0  . Years of education: Not on file  . Highest education level: Not on file  Occupational History  . Occupation: Nurse-Personal Care/HH    Employer: UNEMPLOYED  Tobacco  Use  . Smoking status: Former Smoker    Packs/day: 1.00    Years: 25.00    Pack years: 25.00    Types: Cigarettes    Quit date: 02/11/1996    Years since quitting: 24.2  . Smokeless tobacco: Never Used  . Tobacco comment: Married, lives with spouse. 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 Vaping Use: Never used  Substance and Sexual Activity  . Alcohol use: Yes    Comment: occ  . Drug use: No  . Sexual activity: Yes    Partners: Male    Birth control/protection: Post-menopausal  Other Topics Concern  . Not on file  Social History Narrative   Married, lives with spouse in [place]. 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   Please see review of systems for further details on the patient's review from today.   Objective:   Physical Exam:  LMP 05/11/2012   Physical Exam Neurological:     Mental Status: She is alert and oriented to person, place, and time.     Cranial Nerves: No dysarthria.  Psychiatric:        Attention and Perception: Attention and perception normal.        Mood and Affect: Mood is anxious and depressed.        Speech: Speech normal.        Behavior: Behavior is cooperative.        Thought Content: Thought content normal. Thought content is not paranoid or delusional. Thought content does not include homicidal or suicidal ideation. Thought content does not include homicidal or suicidal plan.        Cognition and Memory: Cognition and memory normal.        Judgment: Judgment normal.     Comments:  Insight intact Depression much worse off sertraline     Lab Review:     Component Value Date/Time   NA 145 (H) 04/05/2020 1508   NA 142 01/21/2017 1013   NA 143 01/16/2016 1005   K 4.0 04/05/2020 1508   K 3.2 (L) 01/21/2017 1013   K 3.3 (L) 01/16/2016 1005   CL 105 04/05/2020 1508   CL 106 01/21/2017 1013   CO2 23 04/05/2020 1508   CO2 29 01/21/2017 1013   CO2 20 (L) 01/16/2016 1005   GLUCOSE 97 04/05/2020 1508   GLUCOSE 88 12/20/2019 1449   GLUCOSE 95 01/21/2017 1013   BUN 14 04/05/2020 1508   BUN 9  01/21/2017 1013   BUN 12.6 01/16/2016 1005   CREATININE 0.84 04/05/2020 1508   CREATININE 0.94 12/20/2019 1449   CREATININE 0.9 01/21/2017 1013   CREATININE 0.9 01/16/2016 1005   CALCIUM 9.4 04/05/2020 1508   CALCIUM 9.2 01/21/2017 1013   CALCIUM 9.5 01/16/2016 1005   PROT 7.2 04/05/2020 1508   PROT 6.9 01/21/2017 1013   PROT 7.1 01/16/2016 1005   ALBUMIN 4.6 04/05/2020 1508   ALBUMIN 3.6 01/16/2016 1005   AST 16 04/05/2020 1508   AST 13 (L) 12/20/2019 1449   AST 16 01/16/2016 1005   ALT 17 04/05/2020 1508   ALT 15 12/20/2019 1449   ALT 22 01/21/2017 1013   ALT 24 01/16/2016 1005   ALKPHOS 77 04/05/2020 1508   ALKPHOS 62 01/21/2017 1013   ALKPHOS 81 01/16/2016 1005   BILITOT <0.2 04/05/2020 1508   BILITOT 0.3 12/20/2019 1449   BILITOT 0.59 01/16/2016 1005   GFRNONAA 76 04/05/2020 1508   GFRNONAA >60 12/20/2019 1449   GFRAA 87 04/05/2020 1508   GFRAA >60 12/03/2018 1346       Component Value Date/Time   WBC 6.9 04/05/2020 1508   WBC 8.5 12/20/2019 1449   WBC 8.4 06/30/2019 1431   RBC 4.89 04/05/2020 1508   RBC 4.60 12/20/2019 1449   HGB 13.7 04/05/2020 1508   HGB 12.8 01/21/2017 1013   HCT 41.4 04/05/2020 1508   HCT 38.2 01/21/2017 1013   PLT 289 04/05/2020 1508   MCV 85 04/05/2020 1508   MCV 90 01/21/2017 1013   MCH 28.0 04/05/2020 1508   MCH 28.9 12/20/2019 1449   MCHC 33.1 04/05/2020 1508   MCHC 32.7 12/20/2019 1449   RDW 15.0 04/05/2020 1508    RDW 14.5 01/21/2017 1013   LYMPHSABS 2.5 04/05/2020 1508   LYMPHSABS 3.6 (H) 01/21/2017 1013   MONOABS 0.5 12/20/2019 1449   EOSABS 0.1 04/05/2020 1508   EOSABS 0.5 01/21/2017 1013   BASOSABS 0.1 04/05/2020 1508   BASOSABS 0.0 01/21/2017 1013    No results found for: POCLITH, LITHIUM   Lab Results  Component Value Date   PHENYTOIN <0.5 ug/mL (L) 11/05/2006   VALPROATE 73.7 11/25/2016     .res Assessment: Plan:    Nuvia was seen today for follow-up, bipolar disorder with severe depression ([facility]), depression and memory loss.  Diagnoses and all orders for this visit:  Bipolar disorder with severe depression ([place]) -     sertraline (ZOLOFT) 50 MG tablet; 1 daily for 1 week, then 1 and 1/2 tablets daily  Generalized anxiety disorder -     sertraline (ZOLOFT) 50 MG tablet; 1 daily for 1 week, then 1 and 1/2 tablets daily  Panic disorder with agoraphobia -     sertraline (ZOLOFT) 50 MG tablet; 1 daily for 1 week, then 1 and 1/2 tablets daily  Hoarding behavior  Claustrophobia   Poor sleep hygiene.  Improved with modafinil and motivation is better with it but wants to increase it.  TRD.  Lifelong history of chronic anhedonic depression and anxiety with very poor functioning continues.  Prognosis is guarded bc multiple med failures and her resistance to change and chronicity of sx and low motivation for change.  She was getting some benefit from the prior medications a little bit more active and interested and motivated with the addition of modafinil without side effects.  She is highly dysfunctional and much worse off the sertraline for months..  She is not currently manic.  She is tolerating the  medications.  She is had multiple medication failures as noted above.  She is fearful of trying new medications because of a history of suicidal thoughts on Vraylar.   Failed all FDA approved bipolar depression meds except Symbyax.  Couldn't tolerate 2 days of Symbyax  Discussed potential  metabolic side effects associated with atypical antipsychotics, as well as potential risk for movement side effects. Advised pt to contact office if movement side effects occur.   Push fluids bc so much Time in bed and past history of dehydration. Disc risk propranolol but it helps tremor.  Increase activity.  Work on sleep schedule and try to regulate it for overall mental health benefits.  Keep working on improving activity.  Every little bit of progress can be additive over time.  Discussed the Cognitive behavioral approaches.  Doesn't drive much.  She is not motivated to do these things right now.  Modafinil would be likely safer than traditional stimulants which could cause psychotic sx.  Discussed side effects.  She has had none.   Continue other meds. Dont' increase further, Benefit 400 mg modafinil.  Okay to continue the dose reduced to 300 mg daily.  This appt was 30 mins.  FU 6 weeks  Lynder Parents, MD, DFAPA   Please see After Visit Summary for patient specific instructions.  Future Appointments  Date Time Provider [place]  05/23/2020 12:45 PM [facility]-NURSE ROOM [facility] None  05/23/2020  1:30 PM Laurence Ferrari, [place], MD [facility] None  05/30/2020  2:15 PM Laurence Ferrari, [place], MD [facility] None  05/31/2020  3:00 PM Bauert, Nicolasa Ducking, LCSW [facility] None  06/14/2020  3:00 PM Bauert, Nicolasa Ducking, LCSW [facility] None  12/05/2020  2:30 PM Moye, [place], MD [facility] None    No orders of the defined types were placed in this encounter.     -------------------------------

## 2020-05-23 ENCOUNTER — Ambulatory Visit (INDEPENDENT_AMBULATORY_CARE_PROVIDER_SITE_OTHER): Payer: Self-pay | Admitting: Dermatology

## 2020-05-23 ENCOUNTER — Other Ambulatory Visit: Payer: Self-pay

## 2020-05-23 ENCOUNTER — Other Ambulatory Visit (HOSPITAL_COMMUNITY): Payer: Self-pay

## 2020-05-23 DIAGNOSIS — L738 Other specified follicular disorders: Secondary | ICD-10-CM

## 2020-05-23 NOTE — Progress Notes (Deleted)
   Follow-Up Visit   Subjective  Beverly Cole is a 60 y.o. female who presents for the following: No chief complaint on file..    The following portions of the chart were reviewed this encounter and updated as appropriate:      {Review of Systems:34166::"No other skin or systemic complaints."}  Objective  Well appearing patient in no apparent distress; mood and affect are within normal limits.  {IRCV:89381::"O full examination was performed including scalp, head, eyes, ears, nose, lips, neck, chest, axillae, abdomen, back, buttocks, bilateral upper extremities, bilateral lower extremities, hands, feet, fingers, toes, fingernails, and toenails. All findings within normal limits unless otherwise noted below."}   Assessment & Plan   No follow-ups on file.

## 2020-05-23 NOTE — Progress Notes (Signed)
Opened in error

## 2020-05-23 NOTE — Progress Notes (Signed)
   Follow-Up Visit   Subjective  Beverly Cole is a 60 y.o. female who presents for the following: Skin Problem (Patient here today to treat sebaceous hyperplasia at face. ).   The following portions of the chart were reviewed this encounter and updated as appropriate:   Tobacco  Allergies  Meds  Problems  Med Hx  Surg Hx  Fam Hx      Review of Systems:  No other skin or systemic complaints except as noted in HPI or Assessment and Plan.  Objective  Well appearing patient in no apparent distress; mood and affect are within normal limits.  A focused examination was performed including face. Relevant physical exam findings are noted in the Assessment and Plan.  Objective  Face >20: Small yellow papules with a central dell.    Assessment & Plan  Sebaceous hyperplasia of face Face >20   Destruction of lesion - Face >20  Destruction method: electrodesiccation and curettage   Destruction method comment:  Electrodesiccation ONLY Informed consent: discussed and consent obtained   Timeout:  patient name, date of birth, surgical site, and procedure verified Patient was prepped and draped in usual sterile fashion: area prepped with isopropyl alcohol. Anesthesia: the lesion was anesthetized in a standard fashion   Anesthetic:  Topical anesthetic (Lidocaine/Tetracaine 23%/7%) Hemostasis achieved with:  electrodesiccation Outcome: patient tolerated procedure well with no complications   Post-procedure details: wound care instructions given   Additional details:  Vaseline applied  Return for as scheduled.  Graciella Belton, RMA, am acting as scribe for Forest Gleason, MD .  Documentation: I have reviewed the above documentation for accuracy and completeness, and I agree with the above.  Forest Gleason, MD

## 2020-05-23 NOTE — Patient Instructions (Signed)

## 2020-05-25 ENCOUNTER — Other Ambulatory Visit (HOSPITAL_COMMUNITY): Payer: Self-pay

## 2020-05-25 MED FILL — Dexlansoprazole Cap Delayed Release 60 MG: ORAL | 90 days supply | Qty: 90 | Fill #0 | Status: AC

## 2020-05-30 ENCOUNTER — Other Ambulatory Visit: Payer: Self-pay

## 2020-05-30 ENCOUNTER — Other Ambulatory Visit (HOSPITAL_COMMUNITY): Payer: Self-pay

## 2020-05-30 ENCOUNTER — Ambulatory Visit (INDEPENDENT_AMBULATORY_CARE_PROVIDER_SITE_OTHER): Payer: 59 | Admitting: Dermatology

## 2020-05-30 ENCOUNTER — Encounter: Payer: Self-pay | Admitting: Dermatology

## 2020-05-30 DIAGNOSIS — S0180XA Unspecified open wound of other part of head, initial encounter: Secondary | ICD-10-CM

## 2020-05-30 DIAGNOSIS — T148XXA Other injury of unspecified body region, initial encounter: Secondary | ICD-10-CM

## 2020-05-30 DIAGNOSIS — D485 Neoplasm of uncertain behavior of skin: Secondary | ICD-10-CM | POA: Diagnosis not present

## 2020-05-30 DIAGNOSIS — B351 Tinea unguium: Secondary | ICD-10-CM

## 2020-05-30 MED ORDER — FLUCONAZOLE 150 MG PO TABS
150.0000 mg | ORAL_TABLET | ORAL | 0 refills | Status: AC
Start: 2020-05-30 — End: 2020-10-26
  Filled 2020-05-30: qty 12, 84d supply, fill #0
  Filled 2020-08-20: qty 9, 63d supply, fill #1
  Filled 2020-08-20: qty 12, 84d supply, fill #1

## 2020-05-30 MED ORDER — MUPIROCIN 2 % EX OINT
1.0000 "application " | TOPICAL_OINTMENT | Freq: Every day | CUTANEOUS | 0 refills | Status: DC
  Filled 2020-05-30: qty 22, 15d supply, fill #0

## 2020-05-30 NOTE — Patient Instructions (Signed)

## 2020-05-30 NOTE — Progress Notes (Signed)
   Follow-Up Visit   Subjective  Beverly Cole is a 60 y.o. female who presents for the following: Follow-up (Patient here today to follow up on onychomycosis. She took 2 months of fluconazole 150mg  once weekly for 2 months but has discontinued due to a contraindication with another one of her medications (fluoxetine). Patient also has a spot at the left breast that she would like removed. It is regularly irritated ).  The following portions of the chart were reviewed this encounter and updated as appropriate:   Tobacco  Allergies  Meds  Problems  Med Hx  Surg Hx  Fam Hx      Review of Systems:  No other skin or systemic complaints except as noted in HPI or Assessment and Plan.  Objective  Well appearing patient in no apparent distress; mood and affect are within normal limits.  A focused examination was performed including face, chest, feet and toenails. Relevant physical exam findings are noted in the Assessment and Plan.  Objective  Face: Open wound  Objective  Left Breast: 0.8cm firm pink papule  Objective  right great toe: Nails thickened with subungual debris   Assessment & Plan  Open wound Face  Sites of cosmetic destruction of sebaceous hyperplasia and SK  Start mupirocin TID  Ordered Medications: mupirocin ointment (BACTROBAN) 2 %  Neoplasm of uncertain behavior of skin Left Breast  R/o Dermatofibroma vs Other  Symptomatic, plan excision  Onychomycosis right great toe  Chronic condition with duration duration over one year. Condition is bothersome to patient. Currently flared.  Restart fluconazole 150mg  once weekly x 5 months. Reviewed medications carefully, no interactions with fluconazole. She is not taking fluoxetine. She does take Zoloft which is not a concern with fluconazole.  Counseled on rare risk of liver damage, drop in blood counts.  Other Related Medications fluconazole (DIFLUCAN) 150 MG tablet  Return for as scheduled  .  Graciella Belton, RMA, am acting as scribe for Forest Gleason, MD .  Documentation: I have reviewed the above documentation for accuracy and completeness, and I agree with the above.  Forest Gleason, MD

## 2020-05-31 ENCOUNTER — Other Ambulatory Visit: Payer: Self-pay

## 2020-05-31 ENCOUNTER — Ambulatory Visit (INDEPENDENT_AMBULATORY_CARE_PROVIDER_SITE_OTHER): Payer: 59 | Admitting: Psychology

## 2020-05-31 DIAGNOSIS — F331 Major depressive disorder, recurrent, moderate: Secondary | ICD-10-CM | POA: Diagnosis not present

## 2020-06-04 ENCOUNTER — Encounter: Payer: Self-pay | Admitting: Dermatology

## 2020-06-11 ENCOUNTER — Telehealth: Payer: Medicare Other | Admitting: Psychiatry

## 2020-06-14 ENCOUNTER — Ambulatory Visit (INDEPENDENT_AMBULATORY_CARE_PROVIDER_SITE_OTHER): Payer: 59 | Admitting: Psychology

## 2020-06-14 DIAGNOSIS — F331 Major depressive disorder, recurrent, moderate: Secondary | ICD-10-CM

## 2020-06-15 ENCOUNTER — Other Ambulatory Visit: Payer: Self-pay | Admitting: Psychiatry

## 2020-06-15 ENCOUNTER — Other Ambulatory Visit (HOSPITAL_COMMUNITY): Payer: Self-pay

## 2020-06-15 MED ORDER — DIVALPROEX SODIUM ER 500 MG PO TB24
1000.0000 mg | ORAL_TABLET | Freq: Every day | ORAL | 0 refills | Status: DC
  Filled 2020-06-15: qty 180, 90d supply, fill #0

## 2020-06-19 ENCOUNTER — Other Ambulatory Visit: Payer: Self-pay | Admitting: Psychiatry

## 2020-06-20 ENCOUNTER — Other Ambulatory Visit (HOSPITAL_COMMUNITY): Payer: Self-pay

## 2020-06-20 ENCOUNTER — Other Ambulatory Visit: Payer: Self-pay | Admitting: Psychiatry

## 2020-06-20 MED ORDER — BUPROPION HCL ER (SR) 200 MG PO TB12
200.0000 mg | ORAL_TABLET | Freq: Every morning | ORAL | 0 refills | Status: DC
  Filled 2020-06-20: qty 90, 90d supply, fill #0

## 2020-06-26 ENCOUNTER — Other Ambulatory Visit: Payer: Self-pay | Admitting: Psychiatry

## 2020-06-26 DIAGNOSIS — F314 Bipolar disorder, current episode depressed, severe, without psychotic features: Secondary | ICD-10-CM

## 2020-06-27 ENCOUNTER — Encounter: Payer: Self-pay | Admitting: Dermatology

## 2020-06-27 ENCOUNTER — Other Ambulatory Visit: Payer: Self-pay

## 2020-06-27 ENCOUNTER — Other Ambulatory Visit: Payer: Self-pay | Admitting: Psychiatry

## 2020-06-27 ENCOUNTER — Ambulatory Visit (INDEPENDENT_AMBULATORY_CARE_PROVIDER_SITE_OTHER): Payer: 59 | Admitting: Dermatology

## 2020-06-27 ENCOUNTER — Other Ambulatory Visit (HOSPITAL_COMMUNITY): Payer: Self-pay

## 2020-06-27 DIAGNOSIS — D492 Neoplasm of unspecified behavior of bone, soft tissue, and skin: Secondary | ICD-10-CM

## 2020-06-27 DIAGNOSIS — F314 Bipolar disorder, current episode depressed, severe, without psychotic features: Secondary | ICD-10-CM

## 2020-06-27 DIAGNOSIS — D235 Other benign neoplasm of skin of trunk: Secondary | ICD-10-CM | POA: Diagnosis not present

## 2020-06-27 NOTE — Progress Notes (Signed)
   Follow-Up Visit   Subjective  Beverly Cole is a 60 y.o. female who presents for the following: Procedure (Here for surgical excision of lesion on left medial breast. ).   The following portions of the chart were reviewed this encounter and updated as appropriate:  Tobacco  Allergies  Meds  Problems  Med Hx  Surg Hx  Fam Hx      Review of Systems: No other skin or systemic complaints except as noted in HPI or Assessment and Plan.  Objective  Well appearing patient in no apparent distress; mood and affect are within normal limits.  A focused examination was performed including face, chest. Relevant physical exam findings are noted in the Assessment and Plan.  Objective  Left Breast: 1.2 cm Pink firm nodule  Assessment & Plan  Neoplasm of skin Left Breast  Skin excision  Total excision diameter (cm):  1.2 Informed consent: discussed and consent obtained   Timeout: patient name, date of birth, surgical site, and procedure verified   Procedure prep:  Patient was prepped and draped in usual sterile fashion Prep type:  Chlorhexidine Anesthesia: the lesion was anesthetized in a standard fashion   Anesthetic:  1% lidocaine w/ epinephrine 1-100,000 buffered w/ 8.4% NaHCO3 Instrument used: #15 blade   Hemostasis achieved with: pressure and electrodesiccation   Outcome: patient tolerated procedure well with no complications    Skin repair Complexity:  Intermediate Final length (cm):  4.1 Informed consent: discussed and consent obtained   Undermining: edges could be approximated without difficulty and edges undermined   Subcutaneous layers (deep stitches):  Suture size:  4-0 Suture type: Vicryl (polyglactin 910)   Stitches:  Buried vertical mattress Fine/surface layer approximation (top stitches):  Suture size:  4-0 Suture type: Prolene (polypropylene)   Stitches: simple running   Hemostasis achieved with: suture, pressure and electrodesiccation Outcome: patient  tolerated procedure well with no complications   Post-procedure details: sterile dressing applied and wound care instructions given   Dressing type: bacitracin (mupirocin)   Additional details:  Mupirocin and a pressure bandage applied  Specimen 1 - Surgical pathology Differential Diagnosis: Dermatofibroma vs other  Check Margins: No 1.2 cm pink firm papule  Return in about 1 week (around 07/04/2020) for suture removal.   I, Emelia Salisbury, CMA, am acting as scribe for Forest Gleason, MD.  Documentation: I have reviewed the above documentation for accuracy and completeness, and I agree with the above.  Forest Gleason, MD

## 2020-06-27 NOTE — Patient Instructions (Signed)

## 2020-06-27 NOTE — Telephone Encounter (Signed)
03/14/20 for 90 days.appt on 07/31/20

## 2020-06-28 ENCOUNTER — Other Ambulatory Visit (HOSPITAL_COMMUNITY): Payer: Self-pay

## 2020-06-28 ENCOUNTER — Ambulatory Visit (INDEPENDENT_AMBULATORY_CARE_PROVIDER_SITE_OTHER): Payer: 59 | Admitting: Psychology

## 2020-06-28 ENCOUNTER — Ambulatory Visit: Payer: 59 | Admitting: Psychology

## 2020-06-28 DIAGNOSIS — F331 Major depressive disorder, recurrent, moderate: Secondary | ICD-10-CM | POA: Diagnosis not present

## 2020-06-28 MED ORDER — MODAFINIL 200 MG PO TABS
300.0000 mg | ORAL_TABLET | Freq: Every morning | ORAL | 0 refills | Status: DC
Start: 2020-06-28 — End: 2020-10-01
  Filled 2020-06-28 – 2020-06-29 (×2): qty 135, 90d supply, fill #0

## 2020-06-29 ENCOUNTER — Other Ambulatory Visit (HOSPITAL_COMMUNITY): Payer: Self-pay

## 2020-07-02 ENCOUNTER — Encounter: Payer: Self-pay | Admitting: Dermatology

## 2020-07-04 ENCOUNTER — Ambulatory Visit (INDEPENDENT_AMBULATORY_CARE_PROVIDER_SITE_OTHER): Payer: 59 | Admitting: Dermatology

## 2020-07-04 ENCOUNTER — Other Ambulatory Visit: Payer: Self-pay

## 2020-07-04 DIAGNOSIS — D239 Other benign neoplasm of skin, unspecified: Secondary | ICD-10-CM

## 2020-07-04 DIAGNOSIS — D235 Other benign neoplasm of skin of trunk: Secondary | ICD-10-CM

## 2020-07-04 NOTE — Progress Notes (Signed)
   Follow-Up Visit   Subjective  Beverly Cole is a 60 y.o. female who presents for the following: Follow-up (Patient here today for suture removal. ).  The following portions of the chart were reviewed this encounter and updated as appropriate:   Tobacco  Allergies  Meds  Problems  Med Hx  Surg Hx  Fam Hx      Review of Systems:  No other skin or systemic complaints except as noted in HPI or Assessment and Plan.  Objective  Well appearing patient in no apparent distress; mood and affect are within normal limits.  A focused examination was performed including chest. Relevant physical exam findings are noted in the Assessment and Plan.  Objective  Left Breast: Excision site   Assessment & Plan  Dermatofibroma Left Breast  Biopsy proven   Patient here today for wound check. Wound site clean, dry and intact with focal dusky area. Will have patient continue mupirocin and cover and return for suture removal in 1 week.   Return in about 1 week (around 07/11/2020) for Suture Removal.  Beverly Cole, RMA, am acting as scribe for Forest Gleason, MD .  Documentation: I have reviewed the above documentation for accuracy and completeness, and I agree with the above.  Forest Gleason, MD

## 2020-07-08 ENCOUNTER — Encounter: Payer: Self-pay | Admitting: Dermatology

## 2020-07-08 ENCOUNTER — Other Ambulatory Visit: Payer: Self-pay | Admitting: Psychiatry

## 2020-07-08 ENCOUNTER — Other Ambulatory Visit: Payer: Self-pay | Admitting: Dermatology

## 2020-07-08 DIAGNOSIS — F4001 Agoraphobia with panic disorder: Secondary | ICD-10-CM

## 2020-07-08 DIAGNOSIS — T148XXA Other injury of unspecified body region, initial encounter: Secondary | ICD-10-CM

## 2020-07-08 DIAGNOSIS — F4000 Agoraphobia, unspecified: Secondary | ICD-10-CM

## 2020-07-09 MED ORDER — ALPRAZOLAM 0.5 MG PO TABS
0.5000 mg | ORAL_TABLET | Freq: Two times a day (BID) | ORAL | 0 refills | Status: DC | PRN
  Filled 2020-07-09: qty 30, 15d supply, fill #0

## 2020-07-10 ENCOUNTER — Other Ambulatory Visit (HOSPITAL_COMMUNITY): Payer: Self-pay

## 2020-07-10 MED ORDER — MUPIROCIN 2 % EX OINT
1.0000 "application " | TOPICAL_OINTMENT | Freq: Every day | CUTANEOUS | 0 refills | Status: DC
  Filled 2020-07-10: qty 22, 22d supply, fill #0

## 2020-07-12 ENCOUNTER — Ambulatory Visit: Payer: 59 | Admitting: Psychology

## 2020-07-12 ENCOUNTER — Telehealth: Payer: 59 | Admitting: Family

## 2020-07-12 DIAGNOSIS — U071 COVID-19: Secondary | ICD-10-CM

## 2020-07-13 ENCOUNTER — Encounter: Payer: Self-pay | Admitting: Dermatology

## 2020-07-13 ENCOUNTER — Other Ambulatory Visit (HOSPITAL_COMMUNITY): Payer: Self-pay

## 2020-07-13 ENCOUNTER — Encounter: Payer: Self-pay | Admitting: Family Medicine

## 2020-07-13 MED ORDER — CARESTART COVID-19 HOME TEST VI KIT
PACK | 0 refills | Status: DC
  Filled 2020-07-13: qty 4, 4d supply, fill #0

## 2020-07-13 MED ORDER — BENZONATATE 100 MG PO CAPS
100.0000 mg | ORAL_CAPSULE | Freq: Three times a day (TID) | ORAL | 0 refills | Status: DC | PRN
  Filled 2020-07-13: qty 20, 7d supply, fill #0

## 2020-07-13 MED ORDER — ALBUTEROL SULFATE HFA 108 (90 BASE) MCG/ACT IN AERS
2.0000 | INHALATION_SPRAY | Freq: Four times a day (QID) | RESPIRATORY_TRACT | 0 refills | Status: DC | PRN
Start: 2020-07-13 — End: 2020-07-31
  Filled 2020-07-13: qty 8.5, 25d supply, fill #0

## 2020-07-13 NOTE — Progress Notes (Signed)
E-Visit for Positive Covid Test Result We are sorry you are not feeling well. We are here to help!  You have tested positive for COVID-19, meaning that you were infected with the novel coronavirus and could give the virus to others.  It is vitally important that you stay home so you do not spread it to others.      Please continue isolation at home, for at least 10 days since the start of your symptoms and until you have had 24 hours with no fever (without taking a fever reducer) and with improving of symptoms.  If you have no symptoms but tested positive (or all symptoms resolve after 5 days and you have no fever) you can leave your house but continue to wear a mask around others for an additional 5 days. If you have a fever,continue to stay home until you have had 24 hours of no fever. Most cases improve 5-10 days from onset but we have seen a small number of patients who have gotten worse after the 10 days.  Please be sure to watch for worsening symptoms and remain taking the proper precautions.   Go to the nearest hospital ED for assessment if fever/cough/breathlessness are severe or illness seems like a threat to life.    The following symptoms may appear 2-14 days after exposure: . Fever . Cough . Shortness of breath or difficulty breathing . Chills . Repeated shaking with chills . Muscle pain . Headache . Sore throat . New loss of taste or smell . Fatigue . Congestion or runny nose . Nausea or vomiting . Diarrhea  You have been enrolled in [facility] for COVID-19. Daily you will receive a questionnaire within the [facility] website. Our COVID-19 response team will be monitoring your responses daily.  You can use medication such as prescription cough medication called Tessalon Perles 100 mg. You may take 1-2 capsules every 8 hours as needed for cough and  prescription inhaler called Albuterol MDI 90 mcg /actuation 2 puffs every 4 hours as needed for shortness of breath,  wheezing, cough  You may also take acetaminophen (Tylenol) as needed for fever.  I recommend following up with your PCP or Urgent Care today or tomorrow to see if you need antivirals. This must be started within 5 days of symptoms given your age, hx of PE, and other chronic issues.    HOME CARE: . Only take medications as instructed by your medical team. . Drink plenty of fluids and get plenty of rest. . A steam or ultrasonic humidifier can help if you have congestion.   GET HELP RIGHT AWAY IF YOU HAVE EMERGENCY WARNING SIGNS.  Call 911 or proceed to your closest emergency facility if: . You develop worsening high fever. . Trouble breathing . Bluish lips or face . Persistent pain or pressure in the chest . New confusion . Inability to wake or stay awake . You cough up blood. . Your symptoms become more severe . Inability to hold down food or fluids  This list is not all possible symptoms. Contact your medical provider for any symptoms that are severe or concerning to you.    Your e-visit answers were reviewed by a board certified advanced clinical practitioner to complete your personal care plan.  Depending on the condition, your plan could have included both over the counter or prescription medications.  If there is a problem please reply once you have received a response from your provider.  Your safety is important  to [place].  If you have drug allergies check your prescription carefully.    You can use MyChart to ask questions about today's visit, request a non-urgent call back, or ask for a work or school excuse for 24 hours related to this e-Visit. If it has been greater than 24 hours you will need to follow up with your provider, or enter a new e-Visit to address those concerns. You will get an e-mail in the next two days asking about your experience.  I hope that your e-visit has been valuable and will speed your recovery. Thank you for using e-visits.    Approximately 5  minutes was spent documenting and reviewing patient's chart.

## 2020-07-13 NOTE — Progress Notes (Signed)
I attempted to call pt due to her [place] triggered a Acupuncturist.   Her mailbox is full so was not able to leave a message. I see in her chart where she has been in contact with her PCP via MyChart this morning.   She did an E visit yesterday morning.

## 2020-07-16 ENCOUNTER — Telehealth (INDEPENDENT_AMBULATORY_CARE_PROVIDER_SITE_OTHER): Payer: 59 | Admitting: Family

## 2020-07-16 ENCOUNTER — Other Ambulatory Visit (HOSPITAL_COMMUNITY): Payer: Self-pay

## 2020-07-16 ENCOUNTER — Encounter: Payer: Self-pay | Admitting: Family

## 2020-07-16 ENCOUNTER — Other Ambulatory Visit: Payer: Self-pay

## 2020-07-16 DIAGNOSIS — U071 COVID-19: Secondary | ICD-10-CM | POA: Diagnosis not present

## 2020-07-16 MED ORDER — BENZONATATE 100 MG PO CAPS
100.0000 mg | ORAL_CAPSULE | Freq: Three times a day (TID) | ORAL | 0 refills | Status: DC | PRN
  Filled 2020-07-16: qty 20, 7d supply, fill #0

## 2020-07-16 NOTE — Assessment & Plan Note (Signed)
New.  Unfortunately, she is outside of the window for antiviral treatment, but luckily she is improving clinically. Recommended that she continue tessalon as need, add mucinex as needed. Advised of CDC guidelines for self isolation/ ending isolation.  Advised of safe practice guidelines. Symptom Tier reviewed.  Encouraged to monitor for any worsening symptoms; watch for increased shortness of breath, weakness, and signs of dehydration. Advised when to seek emergency care.  Instructed to rest and hydrate well.  Advised to leave the house during recommended isolation period, only if it is necessary to seek medical care

## 2020-07-16 NOTE — Progress Notes (Signed)
MyChart Video Visit    Virtual Visit via Video Note   This visit type was conducted due to national recommendations for restrictions regarding the COVID-19 Pandemic (e.g. social distancing) in an effort to limit this patient's exposure and mitigate transmission in our community. This patient is at least at moderate risk for complications without adequate follow up. This format is felt to be most appropriate for this patient at this time. Physical exam was limited by quality of the video and audio technology used for the visit. CMA was able to get the patient set up on a video visit.  Patient location: Home Patient and provider in visit Provider location: Office  I discussed the limitations of evaluation and management by telemedicine and the availability of in person appointments. The patient expressed understanding and agreed to proceed.  Visit Date: 07/16/2020  Today's healthcare provider: Nance Pear, NP     Subjective:    Patient ID: Beverly Cole, female    DOB: [birth year], 60 y.o.   MRN: [id number]  No chief complaint on file.   HPI Patient is in today for a video visit. She we to a Wedding on 5/28 "and everyone got COVID." She tested positive on 6/2.  Her initial symptom was cough and later developed sore throat and diarrhea. She reports + nasal congestion and ear pain.  Ear pain is a lot better.     Past Medical History:  Diagnosis Date  . Anxiety   . Atrial fibrillation ([place])    a. Event monitor 2013 - PACs/bradycardia/SVT/short run of atrial fib by event monitor.   Marland Kitchen BIPOLAR DISORDER UNSPECIFIED   . Bradycardia   . Bursitis of hip 09/1999   Bilateral - Dr. Berenice Primas  . DEPRESSION   . Emphysema lung ([place]) 06/20/2016   pt states pulmonalongist stated started recently  . GERD   . Headache(784.0)    was with menstrual cycle. no longer a problem  . HYPERLIPIDEMIA   . Hypertension   . Hypertension   . IRRITABLE BOWEL SYNDROME, HX OF   . Menopausal state  01/2012   FSH = 88.5  . Mitral valve prolapse 12/17/2000   a. dx 1980s, most recent echo did not demonstrate this.  Marland Kitchen MYALGIA   . Normal coronary arteries    a. by cardiac CT 2013.  Marland Kitchen PAT (paroxysmal atrial tachycardia) ([place])   . PE (pulmonary embolism) 02/09/2015   a. Bilateral PEs 01/2015 when d-dimer 0.69, CP lying on left side. Followed by heme-onc - + lupus anticoagulant, mildly depressed protein S. Dr. Marin Olp is not certain if her hypercoagulable studies are significant for a thrombophilic state.  . Premature atrial contractions   . Pulmonary embolus ([place]) 02/10/2015  . Shortness of breath    Pulmonary eval 05/2002  . Thyroid nodule 2014  . Tremors of nervous system     Past Surgical History:  Procedure Laterality Date  . COLONOSCOPY    . LAPAROSCOPIC APPENDECTOMY N/A 03/17/2017   Procedure: APPENDECTOMY LAPAROSCOPIC;  Surgeon: Excell Seltzer, MD;  Location: [facility];  Service: General;  Laterality: N/A;  . Lipoma (R) side  1990   Right back    Family History  Problem Relation Age of Onset  . Lung cancer Mother   . Hypertension Mother   . Hyperlipidemia Mother   . Cancer Father        Esophageal cancer  . Hyperlipidemia Father   . Depression Father   . Cancer Brother   . Pulmonary embolism  Brother   . Colon cancer Paternal Uncle   . Sarcoidosis Other   . Testicular cancer Other     Social History   Socioeconomic History  . Marital status: Married    Spouse name: Not on file  . Number of children: 0  . Years of education: Not on file  . Highest education level: Not on file  Occupational History  . Occupation: Nurse-Personal Care/HH    Employer: UNEMPLOYED  Tobacco Use  . Smoking status: Former Smoker    Packs/day: 1.00    Years: 25.00    Pack years: 25.00    Types: Cigarettes    Quit date: 02/11/1996    Years since quitting: 24.4  . Smokeless tobacco: Never Used  . Tobacco comment: Married, lives with spouse. 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 Vaping Use: Never used  Substance and Sexual Activity  . Alcohol use: Yes    Comment: occ  . Drug use: No  . Sexual activity: Yes    Partners: Male    Birth control/protection: Post-menopausal  Other Topics Concern  . Not on file  Social History Narrative   Married, lives with spouse in [place]. 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 albuterol (VENTOLIN HFA) 108 (90 Base) MCG/ACT inhaler Inhale 2 puffs into the lungs every 6 (six) hours as needed for wheezing or shortness of breath. 8.5 g 0  . ALPRAZolam (XANAX) 0.5 MG tablet TAKE 1 TABLET (0.5 MG TOTAL) BY MOUTH 2 (TWO) TIMES DAILY AS NEEDED FOR ANXIETY. TAKE 1 TABLET 30 MINUTES PRIOR TO PROCEDURE 30 tablet 0  . aspirin EC 325 MG tablet Take 325 mg by mouth daily.    . benzonatate (TESSALON PERLES) 100 MG capsule Take 1 capsule (100 mg total) by mouth 3 (three) times daily as needed. 20 capsule 0  . buPROPion (WELLBUTRIN SR) 200 MG 12 hr tablet Take 1 tablet (200 mg total) by mouth every morning. 90 tablet 0  . COVID-19 At Home Antigen Test (CARESTART COVID-19 HOME TEST) KIT use as directed 4 each 0  . dexlansoprazole (DEXILANT) 60 MG capsule TAKE 1 CAPSULE (60 MG TOTAL) BY MOUTH DAILY. 90 capsule 3  . diltiazem (CARDIZEM CD) 180 MG 24 hr capsule TAKE 1 CAPSULE (180 MG TOTAL) BY MOUTH DAILY. 90 capsule 1  . divalproex (DEPAKOTE ER) 500 MG 24 hr tablet Take 2 tablets (1,000 mg total) by mouth at bedtime. 180 tablet 0  . fexofenadine (ALLEGRA ALLERGY) 180 MG tablet Take 1 tablet (180 mg total) by mouth daily.    .  fluconazole (DIFLUCAN) 150 MG tablet Take 1 tablet (150 mg total) by mouth once a week for 21 doses. 21 tablet 0  . fluticasone (FLONASE) 50 MCG/ACT nasal spray Place 2 sprays into the nose daily as needed. For allergies    . ketoconazole (NIZORAL) 2 % cream Apply 1 application topically daily. 15 g 1  . modafinil (PROVIGIL) 200 MG tablet Take 1.5 tablets (300 mg total) by  mouth in the morning. 135 tablet 0  . mupirocin ointment (BACTROBAN) 2 % Apply 1 application topically daily. 22 g 0  . NONFORMULARY OR COMPOUNDED ITEM Cmp, cbcd, tsh, lipid Dx htn, preventative health 1 each 0  . NONFORMULARY OR COMPOUNDED ITEM sm Azelaic acid/ metronidazole/ivermectin 15%/1%/1% cream apply to face bid 1 each 5  . Probiotic Product (PROBIOTIC DAILY PO) Take 1 tablet by mouth daily.     . propranolol (INDERAL) 20 MG tablet TAKE 1 TABLET (20 MG TOTAL) BY MOUTH 2 (TWO) TIMES DAILY. 180 tablet 3  . sertraline (ZOLOFT) 50 MG tablet take 1 tablet by mouth daily for 1 week, then take 1 and 1/2 tablets daily (Patient taking differently: take 1 tablet by mouth daily for 1 week, then take 1 and 1/2 tablets daily) 135 tablet 0  . tretinoin (RETIN-A) 0.025 % cream as needed.   4  . trimethoprim (TRIMPEX) 100 MG tablet TAKE AFTER INTERCOURSE AS DIRECTED 30 tablet 2   No facility-administered medications prior to visit.    Allergies  Allergen Reactions  . Cariprazine Other (See Comments)    Patient becomes suicidal when taking this medication.  Patient becomes suicidal when taking this medication.   . Metoprolol Other (See Comments)    Hallucinations hair falls out Hallucinations hair falls out  . Nickel Rash  . Iron Nausea And Vomiting    ROS     Objective:    Physical Exam Constitutional:      Appearance: She is well-developed.  Pulmonary:     Effort: Pulmonary effort is normal.  Neurological:     Mental Status: She is alert.  Psychiatric:        Attention and Perception: Attention and perception  normal.        Mood and Affect: Mood and affect normal.        Speech: Speech normal.        Behavior: Behavior normal.        Cognition and Memory: Cognition normal.     LMP 05/11/2012  Wt Readings from Last 3 Encounters:  04/24/20 183 lb 9.6 oz (83.3 kg)  12/20/19 179 lb (81.2 kg)  10/28/19 175 lb (79.4 kg)    Diabetic Foot Exam - Simple   No data filed    Lab Results  Component Value Date   WBC 6.9 04/05/2020   HGB 13.7 04/05/2020   HCT 41.4 04/05/2020   PLT 289 04/05/2020   GLUCOSE 97 04/05/2020   CHOL 321 (H) 04/14/2019   TRIG 359.0 (H) 04/14/2019   HDL 43.00 04/14/2019   LDLDIRECT 227.0 04/14/2019   LDLCALC 154 (H) 11/26/2017   ALT 17 04/05/2020   AST 16 04/05/2020   NA 145 (H) 04/05/2020   K 4.0 04/05/2020   CL 105 04/05/2020   CREATININE 0.84 04/05/2020   BUN 14 04/05/2020   CO2 23 04/05/2020   TSH 3.23 04/14/2019   INR 1.10 02/09/2015   HGBA1C 5.3 03/20/2009   MICROALBUR 0.6 05/19/2011    Lab Results  Component Value Date   TSH 3.23 04/14/2019   Lab Results  Component Value Date   WBC 6.9 04/05/2020   HGB 13.7 04/05/2020   HCT 41.4 04/05/2020   MCV 85 04/05/2020   PLT 289 04/05/2020   Lab Results  Component Value Date   NA 145 (H) 04/05/2020   K 4.0 04/05/2020   CHLORIDE 110 (H) 01/16/2016   CO2 23 04/05/2020   GLUCOSE 97 04/05/2020   BUN 14 04/05/2020  CREATININE 0.84 04/05/2020   BILITOT <0.2 04/05/2020   ALKPHOS 77 04/05/2020   AST 16 04/05/2020   ALT 17 04/05/2020   PROT 7.2 04/05/2020   ALBUMIN 4.6 04/05/2020   CALCIUM 9.4 04/05/2020   ANIONGAP 8 12/20/2019   EGFR 75 (L) 01/16/2016   GFR 60.09 06/30/2019   Lab Results  Component Value Date   CHOL 321 (H) 04/14/2019   Lab Results  Component Value Date   HDL 43.00 04/14/2019   Lab Results  Component Value Date   LDLCALC 154 (H) 11/26/2017   Lab Results  Component Value Date   TRIG 359.0 (H) 04/14/2019   Lab Results  Component Value Date   CHOLHDL 7 04/14/2019    Lab Results  Component Value Date   HGBA1C 5.3 03/20/2009       Assessment & Plan:   Problem List Items Addressed This Visit   None      No orders of the defined types were placed in this encounter.   I discussed the assessment and treatment plan with the patient. The patient was provided an opportunity to ask questions and all were answered. The patient agreed with the plan and demonstrated an understanding of the instructions.   The patient was advised to call back or seek an in-person evaluation if the symptoms worsen or if the condition fails to improve as anticipated.  I provided 20 minutes of face-to-face time during this encounter.   Nance Pear, NP [employer] [facility] [phone] (phone) [phone] (fax)  [place]

## 2020-07-17 ENCOUNTER — Telehealth: Payer: Self-pay

## 2020-07-17 NOTE — Telephone Encounter (Signed)
Pt.'s questionnaire triggered a BPA. Reports worsening weakness and appetite. Attempted to call pt. No answer and cannot leave a message.

## 2020-07-18 ENCOUNTER — Ambulatory Visit: Payer: 59 | Admitting: Dermatology

## 2020-07-18 NOTE — Telephone Encounter (Signed)
Pt called. Unable to leave message

## 2020-07-26 ENCOUNTER — Ambulatory Visit (INDEPENDENT_AMBULATORY_CARE_PROVIDER_SITE_OTHER): Payer: 59 | Admitting: Psychology

## 2020-07-26 DIAGNOSIS — F331 Major depressive disorder, recurrent, moderate: Secondary | ICD-10-CM

## 2020-07-31 ENCOUNTER — Telehealth (INDEPENDENT_AMBULATORY_CARE_PROVIDER_SITE_OTHER): Payer: 59 | Admitting: Psychiatry

## 2020-07-31 ENCOUNTER — Encounter: Payer: Self-pay | Admitting: Psychiatry

## 2020-07-31 DIAGNOSIS — F4001 Agoraphobia with panic disorder: Secondary | ICD-10-CM | POA: Diagnosis not present

## 2020-07-31 DIAGNOSIS — F423 Hoarding disorder: Secondary | ICD-10-CM | POA: Diagnosis not present

## 2020-07-31 DIAGNOSIS — G250 Essential tremor: Secondary | ICD-10-CM

## 2020-07-31 DIAGNOSIS — F40233 Fear of injury: Secondary | ICD-10-CM | POA: Diagnosis not present

## 2020-07-31 DIAGNOSIS — F314 Bipolar disorder, current episode depressed, severe, without psychotic features: Secondary | ICD-10-CM | POA: Diagnosis not present

## 2020-07-31 DIAGNOSIS — F4000 Agoraphobia, unspecified: Secondary | ICD-10-CM | POA: Diagnosis not present

## 2020-07-31 DIAGNOSIS — F411 Generalized anxiety disorder: Secondary | ICD-10-CM | POA: Diagnosis not present

## 2020-07-31 DIAGNOSIS — F40240 Claustrophobia: Secondary | ICD-10-CM

## 2020-07-31 NOTE — Progress Notes (Signed)
Beverly Cole [id number] Nov 13, 1960 60 y.o.  Video Visit via My Chart  I connected with pt by My Chart and verified that I am speaking with the correct person using two identifiers.   I discussed the limitations, risks, security and privacy concerns of performing an evaluation and management service by My Chart  and the availability of in person appointments. I also discussed with the patient that there may be a patient responsible charge related to this service. The patient expressed understanding and agreed to proceed.  I discussed the assessment and treatment plan with the patient. The patient was provided an opportunity to ask questions and all were answered. The patient agreed with the plan and demonstrated an understanding of the instructions.   The patient was advised to call back or seek an in-person evaluation if the symptoms worsen or if the condition fails to improve as anticipated.  I provided 30 minutes of video time during this encounter.  The patient was located at home and the provider was located office. Session started at 3 and ended at 330  Subjective:   Patient ID:  Beverly Cole is a 60 y.o. (DOB Jul 04, 1960) female.  Chief Complaint:  Chief Complaint  Patient presents with   Follow-up   Bipolar disorder with severe depression ([place])   Fatigue   Depression   Anxiety    Depression        Associated symptoms include decreased concentration and fatigue.  Associated symptoms include no suicidal ideas.  Past medical history includes anxiety.   Anxiety Symptoms include decreased concentration, dizziness, nervous/anxious behavior and shortness of breath. Patient reports no chest pain, confusion or suicidal ideas.    Beverly Cole  today for follow-up of chronic depression and anxiety.  Lately depression worse than anxiety.  Seen with husband today  When seen May 11, 2018.  For persistent anxiety we elected to retry buspirone and try increasing it to 30 mg twice  daily if tolerated.   Couldn't tolerate it this time DT muscle spasms.  Pharmacy called saying she could have serotonin syndrome.  When seen August 09, 2018 and she refused med changes despite chronic anxiety and depression. She remained on sertraline 50, Depakote ER 1000 mg, Wellbutrin SR 200 mg AM  Panic wearing cloth masks.  Using a shield.  Not in the office today bc H exposed to Covid.    seen December 09, 2018.  Because of chronic depression and dysfunction with fatigue and poor motivation we decided off label trial  trial of modafinil 200 mg 1/2 each am for 1 week then 200 mg each AM. Did see benefit from it.  More energy and working to stay out of bed more.    seen January 10, 2019.  The modafinil had been helpful.  No meds were changed.  I seen April 07, 2019.  The following was noted: Still sees benefit from modafinil but still not caring for herself like she should.  Not wanting to go anywhere.  Not driven since early fall.  Hard to breathe with the mask and it creates anxiety.  Hard to be in enclosed spaces like cars and planes for extended periods.   Trying to set her alarm clock.  No hallucinations.  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  Plans to send more too.  Still follow through is not great.  Still depression and productivity is still poor but it  is not as poor as it was before modafinil. Plan increase modafinil to 300 mg daily to see if function can be improved.  06/29/2019 appointment, the following is noted: Rare Xanax.  Did increase modafinil to 300 mg daily. Saw benefit with the increase with less time in bed but still markedly functionally impaired.   It seems like I get used to it and then doesn't maintain her get up and go.  Still having a hard time with self care like hygiene. Chronic low motivation and lack of interest is unchanged.   Gall bladder problems.  It's better at the moment.    Not  more sad, just like I've always been.  Can't make herself leave the house DT depression and anxiety.  She won't do chores.   Less excess sleeping.  He works from home 2 days weekly.  No periods of hyperactivity or manic sx since the spring. Attending therapy every 2 weeks.   Can be confused when first wakes up regardless of time of day. Had episode of hallucination in the middle of the night when awakened. Scarecrow frightened her. This is rare event.  No hallucinations during the day.  Melatonin makes it worse. Has had fearful thoughts of planes crashing or bad things happening to others and it might be her fault.  Fight with brother lately and not speaking with him now. Plan:  No med changes  10/11/19 appt with the following noted: Unsteady and tremors gotten worse.  Not gone to doctor. Propranolol not helping as much.   On Depakote ER 1000mg  HS which is a reduction. So tired all the time and H says she's not doing anything.  H says accomplishing littte and still lays in bed a lot.  H says it's 2-3 PM before she gets OOB.  Memory is poor.  Everything is such a chore and doesn't want to do things. H says she starts things she doesn't finish.  Lots of new projects.   Increase modafinil on her own to 400 mg daily in the AM.  Anxious.  Rare Xanax.  Was happy when got new cats in the summer.  Anxious and Depressed and describes anxiety as Moderate. Anxiety symptoms include: Excessive Worry,.  She thinks sertraline reduces her obsessiveness.  Past hx panic so avoidant. Never get to relax.  Pt reports sleeps excessively even with modafinil. Pt reports that appetite is good. Pt reports that energy is poor and loss of interest or pleasure in usual activities, poor motivation and withdrawn from usual activities. Just don't care about things and admits to a lot of general anxiety and everything takes a lot of energy out of her.  Compulsively tapes and watches TV shows, news and awards shows.  Can't delete it bc  I'll miss something.  Watches TV in bed. Concentration is poor. Suicidal thoughts:  denied by patient. But chronic death thoughts.  Poor productivity overall.  Chronic poor self care, showers only weekly.  Just don't care.  Chronic disability.  She thinks sertraline helped the anxiety some.  Likes that H is at home.  Helps her mood.  Doesn't care about going out. Feels faint when she tries to wear a mask.  Too anxious driving and is avoidant. Collects TV shows, she can't erase unless she watches entirely. Hoards some bc afraid she might need it some day.   Rare use of Xanax bc fears addiction. Plan: Attempted referral to neurology at [facility] neurology.  They refused to see patient stating they had  nothing to offer her. It was suggested to patient that she try to get in with Dr. Jannifer Franklin whom she had seen in the past.  01/10/2020 appointment with the following noted: Reduced modafinil to 300 mg bc didn't want to get hooked on anything about 2-3 weeks.  Did initially see benefit from the modafinil but seemed to get tolerant to it. Feels better not talking to brother bc differing views of politics.  Feels he's a negative person but never depressed. Overall thinks she's doing pretty well relatively.  However is chronically depressed and never happy.  Nothing changed with the meds. Depression worse than anxiety. H didn't think modafinil made much difference.  H CO her inactivity.. She felt it was helpful for energy initially. Had to cancel trips bc can't wear a mask for that long.  Makes her sad. Driven twice in a year or so DT anxiety. Wants prn Xanax. Plan: DC sertraline Start fluoxetine 20 mg daily with olanzapine 5 mg daily.  05/22/2020 appointment with following noted: Didn't remember to stop sertraline and took fluoxetine and olanzapine for 2 days and couldn't eat and stopped it. Then went to dermatologist December who told her couldn't take Zoloft with fungus med and stopped sertraline. Then  became more depressed.  I have to go back on the Zoloft. Remained on modafinil, Wellbutrin, Depakote.  Even less motivated to do anything like cook, clean.  Won't go to [employer] etc. Chronic depression with hopelessness to get better Plan: No med changes  07/31/2020 appointment with the following noted: Back on sertraline at 75 mg daily with Remained on modafinil, Wellbutrin, Depakote.  Aunt died and one of worst fears but not thinking about it. Family got Covid but she recovered.  She thought it might kill and didn't care. Tremors are getting worse and balance problems. Went to concert and that was a problem.  Enjoyed the concert. Also saw Kathrene Bongo.  Music always important to her.  1980 had planned to commit suicide listening to an album over and over but didn't attempt. B and M have familial tremor also. Still anxious and depressed and haven't driven in a year DT anxiety.  Afraid to try new meds after negative reaction to Vraylar.  Afraid of any med change.  Afraid of having SI and afraid she'd do it if had SI. Hopeless about getting better.  Saw pulmonologist over weakness and he says she's OK except deconditioned.  Can't tolerate wearing the masks.  Past psych med trials: Wellbutrin XL 300, Sertraline, fluoxetine SE brief  Vraylar SI, Latuda, Abilify , olanzapine  pramipexole, modafinil,  Seroquel which caused side effects of constipation,   lamotrigine,  Hx primidone sed refuses lithium because of altered taste.   She has a history of ataxia on 1500 mg of Depakote.   buspirone SE hallucinations ECT 2016 without help,   Review of Systems:  Review of Systems  Constitutional:  Positive for fatigue.  Respiratory:  Positive for shortness of breath.   Cardiovascular:  Negative for chest pain.  Gastrointestinal:  Positive for abdominal pain. Negative for vomiting.  Neurological:  Positive for dizziness and tremors. Negative for weakness.  Psychiatric/Behavioral:  Positive for  decreased concentration, dysphoric mood and sleep disturbance. Negative for agitation, behavioral problems, confusion, hallucinations, self-injury and suicidal ideas. The patient is nervous/anxious. The patient is not hyperactive.    Medications: I have reviewed the patient's current medications.  Current Outpatient Medications  Medication Sig Dispense Refill   ALPRAZolam (XANAX) 0.5 MG tablet  TAKE 1 TABLET (0.5 MG TOTAL) BY MOUTH 2 (TWO) TIMES DAILY AS NEEDED FOR ANXIETY. TAKE 1 TABLET 30 MINUTES PRIOR TO PROCEDURE 30 tablet 0   aspirin EC 325 MG tablet Take 325 mg by mouth daily.     buPROPion (WELLBUTRIN SR) 200 MG 12 hr tablet Take 1 tablet (200 mg total) by mouth every morning. 90 tablet 0   dexlansoprazole (DEXILANT) 60 MG capsule TAKE 1 CAPSULE (60 MG TOTAL) BY MOUTH DAILY. 90 capsule 3   diltiazem (CARDIZEM CD) 180 MG 24 hr capsule TAKE 1 CAPSULE (180 MG TOTAL) BY MOUTH DAILY. 90 capsule 1   divalproex (DEPAKOTE ER) 500 MG 24 hr tablet Take 2 tablets (1,000 mg total) by mouth at bedtime. 180 tablet 0   fexofenadine (ALLEGRA ALLERGY) 180 MG tablet Take 1 tablet (180 mg total) by mouth daily.     fluconazole (DIFLUCAN) 150 MG tablet Take 1 tablet (150 mg total) by mouth once a week for 21 doses. 21 tablet 0   fluticasone (FLONASE) 50 MCG/ACT nasal spray Place 2 sprays into the nose daily as needed. For allergies     ketoconazole (NIZORAL) 2 % cream Apply 1 application topically daily. 15 g 1   modafinil (PROVIGIL) 200 MG tablet Take 1.5 tablets (300 mg total) by mouth in the morning. 135 tablet 0   mupirocin ointment (BACTROBAN) 2 % Apply 1 application topically daily. 22 g 0   NONFORMULARY OR COMPOUNDED ITEM sm Azelaic acid/ metronidazole/ivermectin 15%/1%/1% cream apply to face bid 1 each 5   Probiotic Product (PROBIOTIC DAILY PO) Take 1 tablet by mouth daily.      propranolol (INDERAL) 20 MG tablet TAKE 1 TABLET (20 MG TOTAL) BY MOUTH 2 (TWO) TIMES DAILY. 180 tablet 3   sertraline  (ZOLOFT) 50 MG tablet take 1 tablet by mouth daily for 1 week, then take 1 and 1/2 tablets daily (Patient taking differently: take 1 tablet by mouth daily for 1 week, then take 1 and 1/2 tablets daily) 135 tablet 0   tretinoin (RETIN-A) 0.025 % cream as needed.   4   trimethoprim (TRIMPEX) 100 MG tablet TAKE AFTER INTERCOURSE AS DIRECTED 30 tablet 2   No current facility-administered medications for this visit.    Medication Side Effects: None  Allergies:  Allergies  Allergen Reactions   Cariprazine Other (See Comments)    Patient becomes suicidal when taking this medication.  Patient becomes suicidal when taking this medication.    Metoprolol Other (See Comments)    Hallucinations hair falls out Hallucinations hair falls out   Nickel Rash   Iron Nausea And Vomiting    Past Medical History:  Diagnosis Date   Anxiety    Atrial fibrillation ([place])    a. Event monitor 2013 - PACs/bradycardia/SVT/short run of atrial fib by event monitor.    BIPOLAR DISORDER UNSPECIFIED    Bradycardia    Bursitis of hip 09/1999   Bilateral - Dr. Berenice Primas   DEPRESSION    Emphysema lung ([place]) 06/20/2016   pt states pulmonalongist stated started recently   GERD    Headache(784.0)    was with menstrual cycle. no longer a problem   HYPERLIPIDEMIA    Hypertension    Hypertension    IRRITABLE BOWEL SYNDROME, HX OF    Menopausal state 01/2012   [facility] = 88.5   Mitral valve prolapse 12/17/2000   a. dx 1980s, most recent echo did not demonstrate this.   MYALGIA    Normal coronary arteries  a. by cardiac CT 2013.   PAT (paroxysmal atrial tachycardia) (HCC)    PE (pulmonary embolism) 02/09/2015   a. Bilateral PEs 01/2015 when d-dimer 0.69, CP lying on left side. Followed by heme-onc - + lupus anticoagulant, mildly depressed protein S. Dr. Marin Olp is not certain if her hypercoagulable studies are significant for a thrombophilic state.   Premature atrial contractions    Pulmonary embolus (HCC) 02/10/2015    Shortness of breath    Pulmonary eval 05/2002   Thyroid nodule 2014   Tremors of nervous system     Family History  Problem Relation Age of Onset   Lung cancer Mother    Hypertension Mother    Hyperlipidemia Mother    Cancer Father        Esophageal cancer   Hyperlipidemia Father    Depression Father    Cancer Brother    Pulmonary embolism Brother    Colon cancer Paternal Uncle    Sarcoidosis Other    Testicular cancer Other     Social History   Socioeconomic History   Marital status: Married    Spouse name: Not on file   Number of children: 0   Years of education: Not on file   Highest education level: Not on file  Occupational History   Occupation: Nurse-Personal Care/HH    Employer: UNEMPLOYED  Tobacco Use   Smoking status: Former    Packs/day: 1.00    Years: 25.00    Pack years: 25.00    Types: Cigarettes    Quit date: 02/11/1996    Years since quitting: 24.4   Smokeless tobacco: Never   Tobacco comments:    Married, lives with spouse. 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   Please see review of systems for further details on the patient's review from today.    Objective:   Physical Exam:  LMP 05/11/2012   Physical Exam Neurological:     Mental Status: She is alert and oriented to person, place, and time.     Cranial Nerves: No dysarthria.     Motor: Tremor present.  Psychiatric:        Attention and Perception: Attention and perception normal.        Mood and Affect: Mood is anxious and depressed.        Speech: Speech normal.        Behavior: Behavior is cooperative.        Thought Content: Thought content normal. Thought content is not paranoid or delusional. Thought content does not include homicidal or suicidal ideation. Thought  content does not include homicidal or suicidal plan.        Cognition and Memory: Cognition and memory normal.        Judgment: Judgment normal.     Comments: Insight intact Depression is better not gone back on sertraline    Lab Review:     Component Value Date/Time   NA 145 (H) 04/05/2020 1508   NA 142 01/21/2017 1013   NA 143 01/16/2016 1005   K 4.0 04/05/2020 1508   K 3.2 (L) 01/21/2017 1013   K 3.3 (L) 01/16/2016 1005   CL 105 04/05/2020 1508   CL 106 01/21/2017 1013   CO2 23 04/05/2020 1508   CO2 29 01/21/2017 1013   CO2 20 (L) 01/16/2016 1005   GLUCOSE 97 04/05/2020 1508   GLUCOSE 88 12/20/2019 1449   GLUCOSE 95 01/21/2017 1013   BUN 14 04/05/2020 1508   BUN 9 01/21/2017 1013   BUN 12.6 01/16/2016 1005   CREATININE 0.84 04/05/2020 1508   CREATININE 0.94 12/20/2019 1449   CREATININE 0.9 01/21/2017 1013   CREATININE 0.9 01/16/2016 1005   CALCIUM 9.4 04/05/2020 1508   CALCIUM 9.2 01/21/2017 1013   CALCIUM 9.5 01/16/2016 1005   PROT 7.2 04/05/2020 1508   PROT 6.9 01/21/2017 1013   PROT 7.1 01/16/2016 1005   ALBUMIN 4.6 04/05/2020 1508   ALBUMIN 3.6 01/16/2016 1005   AST 16 04/05/2020 1508   AST 13 (L) 12/20/2019 1449   AST 16 01/16/2016 1005   ALT 17 04/05/2020 1508   ALT 15 12/20/2019 1449   ALT 22 01/21/2017 1013   ALT 24 01/16/2016 1005   ALKPHOS 77 04/05/2020 1508    ALKPHOS 62 01/21/2017 1013   ALKPHOS 81 01/16/2016 1005   BILITOT <0.2 04/05/2020 1508   BILITOT 0.3 12/20/2019 1449   BILITOT 0.59 01/16/2016 1005   GFRNONAA 76 04/05/2020 1508   GFRNONAA >60 12/20/2019 1449   GFRAA 87 04/05/2020 1508   GFRAA >60 12/03/2018 1346       Component Value Date/Time   WBC 6.9 04/05/2020 1508   WBC 8.5 12/20/2019 1449   WBC 8.4 06/30/2019 1431   RBC 4.89 04/05/2020 1508   RBC 4.60 12/20/2019 1449   HGB 13.7 04/05/2020 1508   HGB 12.8 01/21/2017 1013   HCT 41.4 04/05/2020 1508   HCT 38.2 01/21/2017 1013   PLT 289 04/05/2020 1508   MCV 85 04/05/2020 1508   MCV 90 01/21/2017 1013   MCH 28.0 04/05/2020 1508   MCH 28.9 12/20/2019 1449   MCHC 33.1 04/05/2020 1508   MCHC 32.7 12/20/2019 1449   RDW 15.0 04/05/2020 1508   RDW 14.5 01/21/2017 1013   LYMPHSABS 2.5 04/05/2020 1508   LYMPHSABS 3.6 (H) 01/21/2017 1013   MONOABS 0.5 12/20/2019 1449   EOSABS 0.1 04/05/2020 1508   EOSABS 0.5 01/21/2017 1013   BASOSABS 0.1 04/05/2020 1508   BASOSABS 0.0 01/21/2017 1013    No results found for: POCLITH, LITHIUM   Lab Results  Component Value Date   PHENYTOIN <0.5 ug/mL (L) 11/05/2006   VALPROATE 73.7 11/25/2016     .res Assessment: Plan:    Renly was seen today for follow-up, bipolar disorder with severe depression (hcc), fatigue, depression and anxiety.  Diagnoses and all orders for this visit:  Bipolar disorder with severe depression ([place])  Generalized anxiety disorder  Panic disorder with agoraphobia  Hoarding behavior  Claustrophobia  Agoraphobia  Fear of injury  Benign essential tremor  Poor sleep hygiene.  Improved with modafinil and motivation is better with it but wants to increase it.  TRD.  Lifelong history of chronic anhedonic depression and anxiety with very poor functioning continues.  Prognosis is guarded bc multiple med failures and her resistance to change and chronicity of sx and low motivation for change.  She was  getting some benefit from the prior medications a little bit more active and interested and motivated with the addition of modafinil without side effects.  She is highly dysfunctional and much worse off the sertraline for months..  She is not currently manic.  She is tolerating the medications.  She is had multiple medication failures as noted above.  She is fearful of trying new medications because of a history of suicidal thoughts on Vraylar.   Failed all FDA approved bipolar depression meds except Symbyax.  Couldn't tolerate 2 days of Symbyax Option of trial olanzapine alone without fluoxetine.  Rec retry.  She agrees.  Discussed potential metabolic side effects associated with atypical antipsychotics, as well as potential risk for movement side effects. Advised pt to contact office if movement side effects occur.  For tremor is considering increasing the propranolol. She'll check with cardiologist on this.  Push fluids bc so much Time in bed and past history of dehydration. Disc risk propranolol but it helps tremor.  Increase activity.  Work on sleep schedule and try to regulate it for overall mental health benefits.  Keep working on improving activity.  Every little bit of progress can be additive over time.  Discussed the Cognitive behavioral approaches.  Doesn't drive much.  She is not motivated to do these things right now.  Modafinil would be likely safer than traditional stimulants which could cause psychotic sx.  Discussed side effects.  She has had none.   Continue other meds. Dont' increase further, Benefit 400 mg modafinil.  Okay to continue the dose reduced to 300 mg daily.  This appt was 30 mins.  FU 8-12 weeks  Lynder Parents, MD, DFAPA   Please see After Visit Summary for patient specific instructions.  Future Appointments  Date Time Provider [place]  08/09/2020  3:00 PM Barrie Folk, [place] [facility] None  08/23/2020  3:00 PM Barrie Folk, LCSW [facility] None   10/23/2020  3:30 PM Cottle, [place] MD [facility] None  11/28/2020  2:50 PM Laurence Ferrari, [place], MD [facility] None  01/10/2021  2:50 PM Laurence Ferrari, [place], MD [facility] None  02/07/2021  2:00 PM Burnell Blanks, MD [facility]    No orders of the defined types were placed in this encounter.     -------------------------------

## 2020-08-01 ENCOUNTER — Other Ambulatory Visit (HOSPITAL_COMMUNITY): Payer: Self-pay

## 2020-08-01 MED FILL — Propranolol HCl Tab 20 MG: ORAL | 90 days supply | Qty: 180 | Fill #0 | Status: AC

## 2020-08-09 ENCOUNTER — Ambulatory Visit (INDEPENDENT_AMBULATORY_CARE_PROVIDER_SITE_OTHER): Payer: 59 | Admitting: Psychology

## 2020-08-09 DIAGNOSIS — F331 Major depressive disorder, recurrent, moderate: Secondary | ICD-10-CM

## 2020-08-13 ENCOUNTER — Other Ambulatory Visit: Payer: Self-pay | Admitting: Family Medicine

## 2020-08-13 DIAGNOSIS — I10 Essential (primary) hypertension: Secondary | ICD-10-CM

## 2020-08-14 ENCOUNTER — Other Ambulatory Visit: Payer: Self-pay | Admitting: Family Medicine

## 2020-08-14 ENCOUNTER — Other Ambulatory Visit (HOSPITAL_COMMUNITY): Payer: Self-pay

## 2020-08-14 DIAGNOSIS — I10 Essential (primary) hypertension: Secondary | ICD-10-CM

## 2020-08-14 MED ORDER — DILTIAZEM HCL ER COATED BEADS 180 MG PO CP24
180.0000 mg | ORAL_CAPSULE | Freq: Every day | ORAL | 1 refills | Status: DC
  Filled 2020-08-14: qty 90, 90d supply, fill #0
  Filled 2020-11-09: qty 90, 90d supply, fill #1

## 2020-08-15 ENCOUNTER — Other Ambulatory Visit: Payer: Self-pay | Admitting: Psychiatry

## 2020-08-15 ENCOUNTER — Other Ambulatory Visit (HOSPITAL_COMMUNITY): Payer: Self-pay

## 2020-08-15 DIAGNOSIS — F314 Bipolar disorder, current episode depressed, severe, without psychotic features: Secondary | ICD-10-CM

## 2020-08-15 MED ORDER — OLANZAPINE 5 MG PO TABS
5.0000 mg | ORAL_TABLET | Freq: Every day | ORAL | 1 refills | Status: DC
  Filled 2020-08-15: qty 30, 30d supply, fill #0
  Filled 2020-09-13: qty 30, 30d supply, fill #1

## 2020-08-16 ENCOUNTER — Other Ambulatory Visit (HOSPITAL_COMMUNITY): Payer: Self-pay

## 2020-08-20 ENCOUNTER — Other Ambulatory Visit (HOSPITAL_COMMUNITY): Payer: Self-pay

## 2020-08-20 MED FILL — Dexlansoprazole Cap Delayed Release 60 MG: ORAL | 90 days supply | Qty: 90 | Fill #1 | Status: AC

## 2020-08-23 ENCOUNTER — Ambulatory Visit (INDEPENDENT_AMBULATORY_CARE_PROVIDER_SITE_OTHER): Payer: 59 | Admitting: Psychology

## 2020-08-23 DIAGNOSIS — F331 Major depressive disorder, recurrent, moderate: Secondary | ICD-10-CM | POA: Diagnosis not present

## 2020-09-13 ENCOUNTER — Other Ambulatory Visit: Payer: Self-pay | Admitting: Psychiatry

## 2020-09-13 ENCOUNTER — Other Ambulatory Visit (HOSPITAL_COMMUNITY): Payer: Self-pay

## 2020-09-13 DIAGNOSIS — F4001 Agoraphobia with panic disorder: Secondary | ICD-10-CM

## 2020-09-13 DIAGNOSIS — F314 Bipolar disorder, current episode depressed, severe, without psychotic features: Secondary | ICD-10-CM

## 2020-09-13 DIAGNOSIS — F411 Generalized anxiety disorder: Secondary | ICD-10-CM

## 2020-09-14 ENCOUNTER — Other Ambulatory Visit (HOSPITAL_COMMUNITY): Payer: Self-pay

## 2020-09-14 MED ORDER — SERTRALINE HCL 50 MG PO TABS
ORAL_TABLET | ORAL | 0 refills | Status: DC
  Filled 2020-09-14: qty 135, 90d supply, fill #0

## 2020-09-27 ENCOUNTER — Ambulatory Visit (INDEPENDENT_AMBULATORY_CARE_PROVIDER_SITE_OTHER): Payer: 59 | Admitting: Psychology

## 2020-09-27 DIAGNOSIS — F331 Major depressive disorder, recurrent, moderate: Secondary | ICD-10-CM | POA: Diagnosis not present

## 2020-10-01 ENCOUNTER — Other Ambulatory Visit (HOSPITAL_COMMUNITY): Payer: Self-pay

## 2020-10-01 ENCOUNTER — Other Ambulatory Visit: Payer: Self-pay | Admitting: Psychiatry

## 2020-10-01 ENCOUNTER — Other Ambulatory Visit: Payer: Self-pay | Admitting: Family Medicine

## 2020-10-01 DIAGNOSIS — F314 Bipolar disorder, current episode depressed, severe, without psychotic features: Secondary | ICD-10-CM

## 2020-10-01 DIAGNOSIS — Z1231 Encounter for screening mammogram for malignant neoplasm of breast: Secondary | ICD-10-CM

## 2020-10-01 MED ORDER — BUPROPION HCL ER (SR) 200 MG PO TB12
200.0000 mg | ORAL_TABLET | Freq: Every morning | ORAL | 0 refills | Status: DC
  Filled 2020-10-01: qty 90, 90d supply, fill #0

## 2020-10-01 MED ORDER — MODAFINIL 200 MG PO TABS
300.0000 mg | ORAL_TABLET | Freq: Every morning | ORAL | 0 refills | Status: DC
  Filled 2020-10-01: qty 135, 90d supply, fill #0

## 2020-10-10 ENCOUNTER — Ambulatory Visit (INDEPENDENT_AMBULATORY_CARE_PROVIDER_SITE_OTHER): Payer: 59 | Admitting: Psychology

## 2020-10-10 DIAGNOSIS — F331 Major depressive disorder, recurrent, moderate: Secondary | ICD-10-CM

## 2020-10-17 ENCOUNTER — Other Ambulatory Visit (HOSPITAL_COMMUNITY): Payer: Self-pay

## 2020-10-17 ENCOUNTER — Telehealth: Payer: Self-pay | Admitting: Psychiatry

## 2020-10-17 NOTE — Telephone Encounter (Signed)
Patient lm inquiring about an Olanzapine refill. She stated pharmacy informed her it was discontinued. She states she only received 30 tabs when it should have been 90. Please clarify and advise . # [id number].

## 2020-10-18 ENCOUNTER — Other Ambulatory Visit: Payer: Self-pay | Admitting: Psychiatry

## 2020-10-18 DIAGNOSIS — F314 Bipolar disorder, current episode depressed, severe, without psychotic features: Secondary | ICD-10-CM

## 2020-10-18 MED ORDER — OLANZAPINE 5 MG PO TABS
5.0000 mg | ORAL_TABLET | Freq: Every day | ORAL | 0 refills | Status: DC
  Filled 2020-10-18: qty 30, 30d supply, fill #0

## 2020-10-18 NOTE — Telephone Encounter (Signed)
From my conversation with her May 22, 2020 was that she had stopped the olanzapine after only a couple of days.  Apparently she meant she had stopped the fluoxetine after a couple of days but not olanzapine.  I will send in the prescription for olanzapine.  It may be worth considering a med adjustment of this since the dose is low.  She may get better effects for anxiety and depression at a higher dose but we will consider that at her next appointment.

## 2020-10-18 NOTE — Telephone Encounter (Signed)
Pt stated the pharmacy told her you d/c olanzapine.She stated she did not discuss this with you and has still been taking it.If you want her to continue we will need to send a Rx

## 2020-10-18 NOTE — Telephone Encounter (Signed)
LVM to return call.

## 2020-10-19 ENCOUNTER — Other Ambulatory Visit (HOSPITAL_COMMUNITY): Payer: Self-pay

## 2020-10-19 MED FILL — Trimethoprim Tab 100 MG: ORAL | 30 days supply | Qty: 30 | Fill #0 | Status: AC

## 2020-10-19 NOTE — Telephone Encounter (Signed)
LVM with info

## 2020-10-23 ENCOUNTER — Other Ambulatory Visit (HOSPITAL_COMMUNITY): Payer: Self-pay

## 2020-10-23 ENCOUNTER — Encounter: Payer: Self-pay | Admitting: Psychiatry

## 2020-10-23 ENCOUNTER — Telehealth (INDEPENDENT_AMBULATORY_CARE_PROVIDER_SITE_OTHER): Payer: 59 | Admitting: Psychiatry

## 2020-10-23 DIAGNOSIS — F314 Bipolar disorder, current episode depressed, severe, without psychotic features: Secondary | ICD-10-CM | POA: Diagnosis not present

## 2020-10-23 DIAGNOSIS — F4001 Agoraphobia with panic disorder: Secondary | ICD-10-CM

## 2020-10-23 DIAGNOSIS — F40240 Claustrophobia: Secondary | ICD-10-CM | POA: Diagnosis not present

## 2020-10-23 DIAGNOSIS — F411 Generalized anxiety disorder: Secondary | ICD-10-CM

## 2020-10-23 DIAGNOSIS — G2581 Restless legs syndrome: Secondary | ICD-10-CM

## 2020-10-23 DIAGNOSIS — G250 Essential tremor: Secondary | ICD-10-CM | POA: Diagnosis not present

## 2020-10-23 DIAGNOSIS — F4000 Agoraphobia, unspecified: Secondary | ICD-10-CM

## 2020-10-23 DIAGNOSIS — F40233 Fear of injury: Secondary | ICD-10-CM

## 2020-10-23 DIAGNOSIS — F423 Hoarding disorder: Secondary | ICD-10-CM

## 2020-10-23 MED ORDER — ALPRAZOLAM 0.5 MG PO TABS
0.5000 mg | ORAL_TABLET | Freq: Two times a day (BID) | ORAL | 0 refills | Status: DC | PRN
  Filled 2020-10-23: qty 30, 15d supply, fill #0

## 2020-10-23 MED ORDER — GABAPENTIN 100 MG PO CAPS
100.0000 mg | ORAL_CAPSULE | Freq: Every evening | ORAL | 0 refills | Status: DC | PRN
  Filled 2020-10-23: qty 90, 30d supply, fill #0

## 2020-10-23 NOTE — Progress Notes (Signed)
Beverly Cole [id number] 08/12/1960 60 y.o.  Video Visit via My Chart  I connected with pt by My Chart and verified that I am speaking with the correct person using two identifiers.   I discussed the limitations, risks, security and privacy concerns of performing an evaluation and management service by My Chart  and the availability of in person appointments. I also discussed with the patient that there may be a patient responsible charge related to this service. The patient expressed understanding and agreed to proceed.  I discussed the assessment and treatment plan with the patient. The patient was provided an opportunity to ask questions and all were answered. The patient agreed with the plan and demonstrated an understanding of the instructions.   The patient was advised to call back or seek an in-person evaluation if the symptoms worsen or if the condition fails to improve as anticipated.  I provided 30 minutes of video time during this encounter.  The patient was located at home and the provider was located office. Session started at 3 and ended at 330  Subjective:   Patient ID:  Beverly Cole is a 60 y.o. (DOB [birth year]) female.  Chief Complaint:  Chief Complaint  Patient presents with   Depression   Follow-up   Fatigue   Anxiety    Depression        Associated symptoms include decreased concentration and fatigue.  Associated symptoms include no suicidal ideas.  Past medical history includes anxiety.   Anxiety Symptoms include decreased concentration, dizziness, nervous/anxious behavior and shortness of breath. Patient reports no chest pain, confusion or suicidal ideas.    [name]  today for follow-up of chronic depression and anxiety.  Lately depression worse than anxiety.  Seen with husband today  When seen May 11, 2018.  For persistent anxiety we elected to retry buspirone and try increasing it to 30 mg twice daily if tolerated.   Couldn't tolerate it this  time DT muscle spasms.  Pharmacy called saying she could have serotonin syndrome.  When seen August 09, 2018 and she refused med changes despite chronic anxiety and depression. She remained on sertraline 50, Depakote ER 1000 mg, Wellbutrin SR 200 mg AM  Panic wearing cloth masks.  Using a shield.  Not in the office today bc H exposed to Covid.    seen December 09, 2018.  Because of chronic depression and dysfunction with fatigue and poor motivation we decided off label trial  trial of modafinil 200 mg 1/2 each am for 1 week then 200 mg each AM. Did see benefit from it.  More energy and working to stay out of bed more.    seen January 10, 2019.  The modafinil had been helpful.  No meds were changed.  I seen April 07, 2019.  The following was noted: Still sees benefit from modafinil but still not caring for herself like she should.  Not wanting to go anywhere.  Not driven since early fall.  Hard to breathe with the mask and it creates anxiety.  Hard to be in enclosed spaces like cars and planes for extended periods.   Trying to set her alarm clock.  No hallucinations.  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  Plans to send more too.  Still follow through is not great.  Still depression and productivity is still poor but it is not as poor as it was before  modafinil. Plan increase modafinil to 300 mg daily to see if function can be improved.  06/29/2019 appointment, the following is noted: Rare Xanax.  Did increase modafinil to 300 mg daily. Saw benefit with the increase with less time in bed but still markedly functionally impaired.   It seems like I get used to it and then doesn't maintain her get up and go.  Still having a hard time with self care like hygiene. Chronic low motivation and lack of interest is unchanged.   Gall bladder problems.  It's better at the moment.    Not more sad, just like I've always been.  Can't make  herself leave the house DT depression and anxiety.  She won't do chores.   Less excess sleeping.  He works from home 2 days weekly.  No periods of hyperactivity or manic sx since the spring. Attending therapy every 2 weeks.   Can be confused when first wakes up regardless of time of day. Had episode of hallucination in the middle of the night when awakened. Scarecrow frightened her. This is rare event.  No hallucinations during the day.  Melatonin makes it worse. Has had fearful thoughts of planes crashing or bad things happening to others and it might be her fault.  Fight with brother lately and not speaking with him now. Plan:  No med changes  10/11/19 appt with the following noted: Unsteady and tremors gotten worse.  Not gone to doctor. Propranolol not helping as much.   On Depakote ER '1000mg'$  HS which is a reduction. So tired all the time and H says she's not doing anything.  H says accomplishing littte and still lays in bed a lot.  H says it's 2-3 PM before she gets OOB.  Memory is poor.  Everything is such a chore and doesn't want to do things. H says she starts things she doesn't finish.  Lots of new projects.   Increase modafinil on her own to 400 mg daily in the AM.  Anxious.  Rare Xanax.  Was happy when got new cats in the summer.  Anxious and Depressed and describes anxiety as Moderate. Anxiety symptoms include: Excessive Worry,.  She thinks sertraline reduces her obsessiveness.  Past hx panic so avoidant. Never get to relax.  Pt reports sleeps excessively even with modafinil. Pt reports that appetite is good. Pt reports that energy is poor and loss of interest or pleasure in usual activities, poor motivation and withdrawn from usual activities. Just don't care about things and admits to a lot of general anxiety and everything takes a lot of energy out of her.  Compulsively tapes and watches TV shows, news and awards shows.  Can't delete it bc I'll miss something.  Watches TV in bed.  Concentration is poor. Suicidal thoughts:  denied by patient. But chronic death thoughts.  Poor productivity overall.  Chronic poor self care, showers only weekly.  Just don't care.  Chronic disability.  She thinks sertraline helped the anxiety some.  Likes that H is at home.  Helps her mood.  Doesn't care about going out. Feels faint when she tries to wear a mask.  Too anxious driving and is avoidant. Collects TV shows, she can't erase unless she watches entirely. Hoards some bc afraid she might need it some day.   Rare use of Xanax bc fears addiction. Plan: Attempted referral to neurology at [facility].  They refused to see patient stating they had nothing to offer her. It was suggested to  patient that she try to get in with Dr. Jannifer Franklin whom she had seen in the past.  01/10/2020 appointment with the following noted: Reduced modafinil to 300 mg bc didn't want to get hooked on anything about 2-3 weeks.  Did initially see benefit from the modafinil but seemed to get tolerant to it. Feels better not talking to brother bc differing views of politics.  Feels he's a negative person but never depressed. Overall thinks she's doing pretty well relatively.  However is chronically depressed and never happy.  Nothing changed with the meds. Depression worse than anxiety. H didn't think modafinil made much difference.  H CO her inactivity.. She felt it was helpful for energy initially. Had to cancel trips bc can't wear a mask for that long.  Makes her sad. Driven twice in a year or so DT anxiety. Wants prn Xanax. Plan: DC sertraline Start fluoxetine 20 mg daily with olanzapine 5 mg daily.  05/22/2020 appointment with following noted: Didn't remember to stop sertraline and took fluoxetine and olanzapine for 2 days and couldn't eat and stopped it. Then went to dermatologist December who told her couldn't take Zoloft with fungus med and stopped sertraline. Then became more depressed.  I have to go back on  the Zoloft. Remained on modafinil, Wellbutrin, Depakote.  Even less motivated to do anything like cook, clean.  Won't go to [employer] etc. Chronic depression with hopelessness to get better Plan: No med changes  07/31/2020 appointment with the following noted: Back on sertraline at 75 mg daily with Remained on modafinil, Wellbutrin, Depakote.  Aunt died and one of worst fears but not thinking about it. Family got Covid but she recovered.  She thought it might kill and didn't care. Tremors are getting worse and balance problems. Went to concert and that was a problem.  Enjoyed the concert. Also saw Kathrene Bongo.  Music always important to her.  1980 had planned to commit suicide listening to an album over and over but didn't attempt. B and M have familial tremor also. Still anxious and depressed and haven't driven in a year DT anxiety. Plan: Option of trial olanzapine alone without fluoxetine.  Rec retry.  She agrees.  10/23/2020 appointment with the following noted: "Olanzapine is really working."  Initial SE drunk at 5 mg HS so cut it in half.  Got used to it but I feel happy and more relaxed and easier time going to sleep.  If I could still chose to die I would but clearly happier but not more hopeful.  Laughing more.  No SI. Needs to care about things more.  Chronic lack of motivations. Still doesn't want to leave the house but did enjoy a concert in [place]. Chronic unsteadiness is worse.  Last disc with doctor in March who rec PT but she never did the PT.  Misses aunt who died.    She reduced the Depakote to 500 mg HS. When stopped sertraline was not doing well and restarted it bc stressed and unhappy.  Seems to be a key med for me. Pending card test soon.  Feels better than in the past. RLS is worse after starting olanzapine and even affecting arms but had it before.  Happens after lays down at night.  Takes MG which helps.   Xanax about once every 3 weeks.  Afraid to try new meds  after negative reaction to Vraylar.  Afraid of any med change.  Afraid of having SI and afraid she'd do it if  had SI. Hopeless about getting better.  Saw pulmonologist over weakness and he says she's OK except deconditioned.  Can't tolerate wearing the masks.  Past psych med trials: Wellbutrin XL 300, Sertraline, fluoxetine SE brief  Vraylar SI, Latuda, Abilify , olanzapine  pramipexole, modafinil,  Seroquel which caused side effects of constipation,   lamotrigine,  Hx primidone sed refuses lithium because of altered taste.   She has a history of ataxia on 1500 mg of Depakote.   buspirone SE hallucinations ECT 2016 without help,   Review of Systems:  Review of Systems  Constitutional:  Positive for fatigue.  Respiratory:  Positive for shortness of breath.   Cardiovascular:  Negative for chest pain.  Gastrointestinal:  Positive for abdominal pain. Negative for vomiting.  Musculoskeletal:  Positive for gait problem.  Neurological:  Positive for dizziness and tremors. Negative for weakness.  Psychiatric/Behavioral:  Positive for decreased concentration, dysphoric mood and sleep disturbance. Negative for agitation, behavioral problems, confusion, hallucinations, self-injury and suicidal ideas. The patient is nervous/anxious. The patient is not hyperactive.    Medications: I have reviewed the patient's current medications.  Current Outpatient Medications  Medication Sig Dispense Refill   aspirin EC 325 MG tablet Take 325 mg by mouth daily.     buPROPion (WELLBUTRIN SR) 200 MG 12 hr tablet Take 1 tablet (200 mg total) by mouth every morning. 90 tablet 0   dexlansoprazole (DEXILANT) 60 MG capsule TAKE 1 CAPSULE (60 MG TOTAL) BY MOUTH DAILY. 90 capsule 3   diltiazem (CARTIA XT) 180 MG 24 hr capsule Take 1 capsule (180 mg total) by mouth daily. 90 capsule 1   divalproex (DEPAKOTE ER) 500 MG 24 hr tablet Take 2 tablets (1,000 mg total) by mouth at bedtime. 180 tablet 0   fexofenadine (ALLEGRA  ALLERGY) 180 MG tablet Take 1 tablet (180 mg total) by mouth daily.     fluconazole (DIFLUCAN) 150 MG tablet Take 1 tablet (150 mg total) by mouth once a week for 21 doses. 21 tablet 0   fluticasone (FLONASE) 50 MCG/ACT nasal spray Place 2 sprays into the nose daily as needed. For allergies     gabapentin (NEURONTIN) 100 MG capsule 1-3 capsules at night as needed for restless legs 90 capsule 0   ketoconazole (NIZORAL) 2 % cream Apply 1 application topically daily. 15 g 1   modafinil (PROVIGIL) 200 MG tablet Take 1.5 tablets (300 mg total) by mouth in the morning. (Patient taking differently: Take 200 mg by mouth in the morning.) 135 tablet 0   mupirocin ointment (BACTROBAN) 2 % Apply 1 application topically daily. 22 g 0   NONFORMULARY OR COMPOUNDED ITEM sm Azelaic acid/ metronidazole/ivermectin 15%/1%/1% cream apply to face bid 1 each 5   OLANZapine (ZYPREXA) 5 MG tablet Take 1 tablet (5 mg total) by mouth at bedtime. (Patient taking differently: Take 2.5 mg by mouth at bedtime.) 30 tablet 0   Probiotic Product (PROBIOTIC DAILY PO) Take 1 tablet by mouth daily.      propranolol (INDERAL) 20 MG tablet TAKE 1 TABLET (20 MG TOTAL) BY MOUTH 2 (TWO) TIMES DAILY. 180 tablet 3   sertraline (ZOLOFT) 50 MG tablet Take 1 and 1/2 tablets by mouth daily (Patient taking differently: Take 50 mg by mouth daily.) 135 tablet 0   tretinoin (RETIN-A) 0.025 % cream as needed.   4   trimethoprim (TRIMPEX) 100 MG tablet TAKE AFTER INTERCOURSE AS DIRECTED 30 tablet 2   ALPRAZolam (XANAX) 0.5 MG tablet TAKE 1 TABLET (0.5  MG TOTAL) BY MOUTH 2 (TWO) TIMES DAILY AS NEEDED FOR ANXIETY. TAKE 1 TABLET 30 MINUTES PRIOR TO PROCEDURE 30 tablet 0   No current facility-administered medications for this visit.    Medication Side Effects: None  Allergies:  Allergies  Allergen Reactions   Cariprazine Other (See Comments)    Patient becomes suicidal when taking this medication.  Patient becomes suicidal when taking this  medication.    Metoprolol Other (See Comments)    Hallucinations hair falls out Hallucinations hair falls out   Nickel Rash   Iron Nausea And Vomiting    Past Medical History:  Diagnosis Date   Anxiety    Atrial fibrillation ([place])    a. Event monitor 2013 - PACs/bradycardia/SVT/short run of atrial fib by event monitor.    BIPOLAR DISORDER UNSPECIFIED    Bradycardia    Bursitis of hip 09/1999   Bilateral - Dr. Berenice Primas   DEPRESSION    Emphysema lung ([place]) 06/20/2016   pt states pulmonalongist stated started recently   GERD    Headache(784.0)    was with menstrual cycle. no longer a problem   HYPERLIPIDEMIA    Hypertension    Hypertension    IRRITABLE BOWEL SYNDROME, HX OF    Menopausal state 01/2012   [facility] = 88.5   Mitral valve prolapse 12/17/2000   a. dx 1980s, most recent echo did not demonstrate this.   MYALGIA    Normal coronary arteries    a. by cardiac CT 2013.   PAT (paroxysmal atrial tachycardia) (HCC)    PE (pulmonary embolism) 02/09/2015   a. Bilateral PEs 01/2015 when d-dimer 0.69, CP lying on left side. Followed by heme-onc - + lupus anticoagulant, mildly depressed protein S. Dr. Marin Olp is not certain if her hypercoagulable studies are significant for a thrombophilic state.   Premature atrial contractions    Pulmonary embolus (HCC) 02/10/2015   Shortness of breath    Pulmonary eval 05/2002   Thyroid nodule 2014   Tremors of nervous system     Family History  Problem Relation Age of Onset   Lung cancer Mother    Hypertension Mother    Hyperlipidemia Mother    Cancer Father        Esophageal cancer   Hyperlipidemia Father    Depression Father    Cancer Brother    Pulmonary embolism Brother    Colon cancer Paternal Uncle    Sarcoidosis Other    Testicular cancer Other     Social History   Socioeconomic History   Marital status: Married    Spouse name: Not on file   Number of children: 0   Years of education: Not on file   Highest education  level: Not on file  Occupational History   Occupation: Nurse-Personal Care/HH    Employer: UNEMPLOYED  Tobacco Use   Smoking status: Former    Packs/day: 1.00    Years: 25.00    Pack years: 25.00    Types: Cigarettes    Quit date: 02/11/1996    Years since quitting: 24.7   Smokeless tobacco: Never   Tobacco comments:    Married, lives with spouse. 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   Please see review of systems for further details on the patient's review from today.   Objective:   Physical Exam:  LMP 05/11/2012   Physical Exam Neurological:     Mental Status: She is alert and oriented to person, place, and time.     Cranial Nerves: No dysarthria.     Motor: Tremor present.  Psychiatric:        Attention and Perception: Attention and perception normal.        Mood and Affect: Mood is anxious and depressed.        Speech: Speech normal.        Behavior: Behavior is cooperative.        Thought Content: Thought content normal. Thought content is not paranoid or delusional. Thought content does not include homicidal or suicidal ideation. Thought content does not include homicidal  or suicidal plan.        Cognition and Memory: Cognition and memory normal.        Judgment: Judgment normal.     Comments: Insight intact Depression is better not gone back on sertraline with olanzapine 2.5 mg added    Lab Review:     Component Value Date/Time   NA 145 (H) 04/05/2020 1508   NA 142 01/21/2017 1013   NA 143 01/16/2016 1005   K 4.0 04/05/2020 1508   K 3.2 (L) 01/21/2017 1013   K 3.3 (L) 01/16/2016 1005   CL 105 04/05/2020 1508   CL 106 01/21/2017 1013   CO2 23 04/05/2020 1508   CO2 29 01/21/2017 1013   CO2 20 (L) 01/16/2016 1005   GLUCOSE 97 04/05/2020 1508   GLUCOSE 88 12/20/2019 1449   GLUCOSE 95 01/21/2017 1013   BUN 14 04/05/2020 1508   BUN 9 01/21/2017 1013   BUN 12.6 01/16/2016 1005   CREATININE 0.84 04/05/2020 1508   CREATININE 0.94 12/20/2019 1449   CREATININE 0.9 01/21/2017 1013   CREATININE 0.9 01/16/2016 1005   CALCIUM 9.4 04/05/2020 1508   CALCIUM 9.2 01/21/2017 1013   CALCIUM 9.5 01/16/2016 1005   PROT 7.2 04/05/2020 1508   PROT 6.9 01/21/2017 1013   PROT 7.1 01/16/2016 1005   ALBUMIN 4.6 04/05/2020 1508   ALBUMIN 3.6 01/16/2016 1005   AST 16 04/05/2020 1508   AST 13 (L) 12/20/2019 1449   AST 16 01/16/2016 1005   ALT 17 04/05/2020 1508   ALT 15 12/20/2019 1449   ALT 22 01/21/2017 1013   ALT 24 01/16/2016 1005   ALKPHOS 77 04/05/2020 1508   ALKPHOS 62 01/21/2017 1013   ALKPHOS 81 01/16/2016 1005   BILITOT <0.2 04/05/2020 1508   BILITOT 0.3 12/20/2019 1449   BILITOT 0.59 01/16/2016 1005   GFRNONAA 76 04/05/2020 1508   GFRNONAA >60 12/20/2019 1449   GFRAA 87 04/05/2020 1508   GFRAA >60 12/03/2018 1346  Component Value Date/Time   WBC 6.9 04/05/2020 1508   WBC 8.5 12/20/2019 1449   WBC 8.4 06/30/2019 1431   RBC 4.89 04/05/2020 1508   RBC 4.60 12/20/2019 1449   HGB 13.7 04/05/2020 1508   HGB 12.8 01/21/2017 1013   HCT 41.4 04/05/2020 1508   HCT 38.2 01/21/2017 1013   PLT 289 04/05/2020 1508   MCV 85 04/05/2020 1508    MCV 90 01/21/2017 1013   MCH 28.0 04/05/2020 1508   MCH 28.9 12/20/2019 1449   MCHC 33.1 04/05/2020 1508   MCHC 32.7 12/20/2019 1449   RDW 15.0 04/05/2020 1508   RDW 14.5 01/21/2017 1013   LYMPHSABS 2.5 04/05/2020 1508   LYMPHSABS 3.6 (H) 01/21/2017 1013   MONOABS 0.5 12/20/2019 1449   EOSABS 0.1 04/05/2020 1508   EOSABS 0.5 01/21/2017 1013   BASOSABS 0.1 04/05/2020 1508   BASOSABS 0.0 01/21/2017 1013    No results found for: POCLITH, LITHIUM   Lab Results  Component Value Date   PHENYTOIN <0.5 ug/mL (L) 11/05/2006   VALPROATE 73.7 11/25/2016     .res Assessment: Plan:    Edi was seen today for depression, follow-up, fatigue and anxiety.  Diagnoses and all orders for this visit:  Bipolar disorder with severe depression ([place])  Uncontrolled restless legs syndrome -     gabapentin (NEURONTIN) 100 MG capsule; 1-3 capsules at night as needed for restless legs  Generalized anxiety disorder  Panic disorder with agoraphobia -     ALPRAZolam (XANAX) 0.5 MG tablet; TAKE 1 TABLET (0.5 MG TOTAL) BY MOUTH 2 (TWO) TIMES DAILY AS NEEDED FOR ANXIETY. TAKE 1 TABLET 30 MINUTES PRIOR TO PROCEDURE  Hoarding behavior  Claustrophobia  Agoraphobia -     ALPRAZolam (XANAX) 0.5 MG tablet; TAKE 1 TABLET (0.5 MG TOTAL) BY MOUTH 2 (TWO) TIMES DAILY AS NEEDED FOR ANXIETY. TAKE 1 TABLET 30 MINUTES PRIOR TO PROCEDURE  Fear of injury  Benign essential tremor  Poor sleep hygiene.  Improved with modafinil and motivation is better with it but wants to increase it.  TRD.  Lifelong history of chronic anhedonic depression and anxiety with very poor functioning continues.  Prognosis is guarded bc multiple med failures and her resistance to change and chronicity of sx and low motivation for change.  She was getting some benefit from the prior medications a little bit more active and interested and motivated with the addition of modafinil without side effects.  She is highly dysfunctional and much  worse off the sertraline for months..  She is not currently manic.  She is tolerating the medications.  She is had multiple medication failures as noted above.  She is fearful of trying new medications because of a history of suicidal thoughts on Vraylar.   Failed all FDA approved bipolar depression meds except Symbyax.  Couldn't tolerate 2 days of Symbyax Successful trial olanzapine 2.5 mg with sertraline 50 but worse RLS  Trial gabapentin for RLS 100-300 mg PM and disc SE.  Higher if needed..  Once controlled consider increase olanzapine.  Discussed potential metabolic side effects associated with atypical antipsychotics, as well as potential risk for movement side effects. Advised pt to contact office if movement side effects occur.  For tremor is considering increasing the propranolol. She'll check with cardiologist on this.  Push fluids bc so much Time in bed and past history of dehydration. Disc risk propranolol but it helps tremor.  Increase activity.  Work on sleep schedule and try to regulate it for  overall mental health benefits.  Keep working on improving activity.  Every little bit of progress can be additive over time.  Discussed the Cognitive behavioral approaches.  Doesn't drive much.  She is not motivated to do these things right now.  Modafinil would be likely safer than traditional stimulants which could cause psychotic sx.  Discussed side effects.  She has had none.   Continue other meds. Dont' increase further, Benefit 400 mg modafinil.  Okay to continue the dose reduced to 200 mg daily.  This appt was 30 mins.  FU 8-12 weeks  Lynder Parents, MD, DFAPA   Please see After Visit Summary for patient specific instructions.  Future Appointments  Date Time Provider [place]  10/25/2020  3:00 PM Bauert, Nicolasa Ducking, LCSW [facility] None  11/08/2020  3:00 PM Bauert, Nicolasa Ducking, LCSW [facility] None  11/14/2020  3:20 PM [facility] MM 3 [facility]  11/28/2020  2:50 PM Moye,  [place], MD [facility] None  01/10/2021  2:50 PM Moye, [place], MD [facility] None  02/07/2021  2:00 PM Burnell Blanks, MD [facility]    No orders of the defined types were placed in this encounter.     -------------------------------

## 2020-10-24 ENCOUNTER — Other Ambulatory Visit (HOSPITAL_COMMUNITY): Payer: Self-pay

## 2020-10-25 ENCOUNTER — Ambulatory Visit (INDEPENDENT_AMBULATORY_CARE_PROVIDER_SITE_OTHER): Payer: 59 | Admitting: Psychology

## 2020-10-25 DIAGNOSIS — F331 Major depressive disorder, recurrent, moderate: Secondary | ICD-10-CM

## 2020-11-03 MED FILL — Propranolol HCl Tab 20 MG: ORAL | 90 days supply | Qty: 180 | Fill #1 | Status: AC

## 2020-11-05 ENCOUNTER — Other Ambulatory Visit (HOSPITAL_COMMUNITY): Payer: Self-pay

## 2020-11-08 ENCOUNTER — Ambulatory Visit: Payer: 59 | Admitting: Psychology

## 2020-11-09 ENCOUNTER — Other Ambulatory Visit (HOSPITAL_COMMUNITY): Payer: Self-pay

## 2020-11-14 ENCOUNTER — Other Ambulatory Visit: Payer: Self-pay

## 2020-11-14 ENCOUNTER — Ambulatory Visit
Admission: RE | Admit: 2020-11-14 | Discharge: 2020-11-14 | Disposition: A | Discharge status: Home / Self Care | Payer: 59 | Source: Ambulatory Visit

## 2020-11-14 DIAGNOSIS — Z1231 Encounter for screening mammogram for malignant neoplasm of breast: Secondary | ICD-10-CM

## 2020-11-15 ENCOUNTER — Ambulatory Visit: Payer: 59 | Admitting: Dermatology

## 2020-11-22 ENCOUNTER — Other Ambulatory Visit: Payer: Self-pay | Admitting: Psychiatry

## 2020-11-22 DIAGNOSIS — G2581 Restless legs syndrome: Secondary | ICD-10-CM

## 2020-11-22 MED ORDER — GABAPENTIN 100 MG PO CAPS
100.0000 mg | ORAL_CAPSULE | Freq: Every evening | ORAL | 2 refills | Status: DC | PRN
Start: 2020-11-22 — End: 2021-02-04
  Filled 2020-11-22: qty 90, 30d supply, fill #0
  Filled 2020-12-20: qty 90, 30d supply, fill #1
  Filled 2021-01-16: qty 90, 30d supply, fill #2

## 2020-11-23 ENCOUNTER — Other Ambulatory Visit (HOSPITAL_COMMUNITY): Payer: Self-pay

## 2020-11-27 ENCOUNTER — Other Ambulatory Visit: Payer: Self-pay | Admitting: Psychiatry

## 2020-11-27 DIAGNOSIS — H5213 Myopia, bilateral: Secondary | ICD-10-CM | POA: Diagnosis not present

## 2020-11-27 DIAGNOSIS — F314 Bipolar disorder, current episode depressed, severe, without psychotic features: Secondary | ICD-10-CM

## 2020-11-27 DIAGNOSIS — H04123 Dry eye syndrome of bilateral lacrimal glands: Secondary | ICD-10-CM | POA: Diagnosis not present

## 2020-11-27 DIAGNOSIS — H524 Presbyopia: Secondary | ICD-10-CM | POA: Diagnosis not present

## 2020-11-27 DIAGNOSIS — H1045 Other chronic allergic conjunctivitis: Secondary | ICD-10-CM | POA: Diagnosis not present

## 2020-11-27 DIAGNOSIS — Z135 Encounter for screening for eye and ear disorders: Secondary | ICD-10-CM | POA: Diagnosis not present

## 2020-11-27 DIAGNOSIS — H259 Unspecified age-related cataract: Secondary | ICD-10-CM | POA: Diagnosis not present

## 2020-11-27 MED FILL — Dexlansoprazole Cap Delayed Release 60 MG: ORAL | 90 days supply | Qty: 90 | Fill #2 | Status: AC

## 2020-11-27 MED FILL — Trimethoprim Tab 100 MG: ORAL | 30 days supply | Qty: 30 | Fill #1 | Status: AC

## 2020-11-28 ENCOUNTER — Encounter: Payer: Self-pay | Admitting: Dermatology

## 2020-11-28 ENCOUNTER — Ambulatory Visit (INDEPENDENT_AMBULATORY_CARE_PROVIDER_SITE_OTHER): Payer: 59 | Admitting: Dermatology

## 2020-11-28 ENCOUNTER — Other Ambulatory Visit: Payer: Self-pay

## 2020-11-28 ENCOUNTER — Other Ambulatory Visit (HOSPITAL_COMMUNITY): Payer: Self-pay

## 2020-11-28 DIAGNOSIS — L738 Other specified follicular disorders: Secondary | ICD-10-CM

## 2020-11-28 DIAGNOSIS — B351 Tinea unguium: Secondary | ICD-10-CM | POA: Diagnosis not present

## 2020-11-28 DIAGNOSIS — T148XXA Other injury of unspecified body region, initial encounter: Secondary | ICD-10-CM

## 2020-11-28 MED ORDER — MUPIROCIN 2 % EX OINT
1.0000 "application " | TOPICAL_OINTMENT | Freq: Every day | CUTANEOUS | 2 refills | Status: DC
  Filled 2020-11-28: qty 22, 15d supply, fill #0
  Filled 2020-12-20: qty 22, 15d supply, fill #1
  Filled 2021-01-16: qty 22, 15d supply, fill #2

## 2020-11-28 MED ORDER — OLANZAPINE 5 MG PO TABS
5.0000 mg | ORAL_TABLET | Freq: Every day | ORAL | 2 refills | Status: DC
  Filled 2020-11-28: qty 30, 30d supply, fill #0
  Filled 2021-03-06: qty 30, 30d supply, fill #1
  Filled 2021-03-24: qty 30, 30d supply, fill #2

## 2020-11-28 NOTE — Progress Notes (Signed)
Entered in error

## 2020-11-28 NOTE — Patient Instructions (Signed)

## 2020-11-28 NOTE — Progress Notes (Addendum)
   Follow-Up Visit   Subjective  Beverly Cole is a 60 y.o. female who presents for the following: Nail Problem (Patient here today for onychomycosis follow up at the left great toe. Patient took fluconazole once weekly for 5 months with no problems. Patient advises the toe is not any better.) and Skin Problem (Patient here to treat sebaceous hyperplasia at face and neck. ).    The following portions of the chart were reviewed this encounter and updated as appropriate:   Tobacco  Allergies  Meds  Problems  Med Hx  Surg Hx  Fam Hx      Review of Systems:  No other skin or systemic complaints except as noted in HPI or Assessment and Plan.  Objective  Well appearing patient in no apparent distress; mood and affect are within normal limits.  A focused examination was performed including face, left foot and nails. Relevant physical exam findings are noted in the Assessment and Plan.  face Small yellow papules with a central dell.   Left Great Toe Nail Left 4th nail thickened with subungual debris   Assessment & Plan  Sebaceous hyperplasia face  Topical numbing (lidocaine/tetracaine) applied to entire face for 30 minutes to achieve good local anesthesia.  Prior to treatment reviewed risk of scar, infection.  Residual from previous treatment. Already paid cosmetic fee.  Destruction of lesion - face  Destruction method: electrodesiccation and curettage   Destruction method comment:  Low level electrocautery only Informed consent: discussed and consent obtained   Timeout:  patient name, date of birth, surgical site, and procedure verified Patient was prepped and draped in usual sterile fashion: area prepped with isopropyl alcohol. Anesthesia: the lesion was anesthetized in a standard fashion   Anesthetic:  Topical anesthetic Hemostasis achieved with:  electrodesiccation Outcome: patient tolerated procedure well with no complications   Post-procedure details: wound care  instructions given   Additional details:  Mupirocin and a pressure dressing applied  mupirocin ointment (BACTROBAN) 2 % - face Apply 1 application topically daily.  Onychomycosis Left Great Toe Nail  Chronic condition with duration over one year. Condition is bothersome to patient. Currently flared.  Has failed fluconazole PO.   Cannot use terbinafine PO due to interaction with propranolol. Discussed option to possibly switch to alternative to propranolol that does not have interaction vs treatment with combination topical vs laser (can call podiatry offices for pricing, info on efficacy)  Pt prefers topical  Start [facility] Ciclopirox-Itraconazole-Fluconazole-Terbinafine HCl-Ibuprofen to left great toenail nightly as directed. [facility] to [facility]  Return in about 4 months (around 03/31/2021).  Graciella Belton, RMA, am acting as scribe for Forest Gleason, MD .  Documentation: I have reviewed the above documentation for accuracy and completeness, and I agree with the above.  Forest Gleason, MD

## 2020-12-04 ENCOUNTER — Other Ambulatory Visit: Payer: Self-pay

## 2020-12-04 ENCOUNTER — Ambulatory Visit (INDEPENDENT_AMBULATORY_CARE_PROVIDER_SITE_OTHER): Payer: 59 | Admitting: Family Medicine

## 2020-12-04 ENCOUNTER — Encounter: Payer: Self-pay | Admitting: Family Medicine

## 2020-12-04 VITALS — BP 144/91 | HR 93 | Temp 98.2°F | Resp 12 | Ht 69.0 in | Wt 177.0 lb

## 2020-12-04 DIAGNOSIS — F314 Bipolar disorder, current episode depressed, severe, without psychotic features: Secondary | ICD-10-CM

## 2020-12-04 DIAGNOSIS — I2602 Saddle embolus of pulmonary artery with acute cor pulmonale: Secondary | ICD-10-CM

## 2020-12-04 DIAGNOSIS — R5383 Other fatigue: Secondary | ICD-10-CM

## 2020-12-04 DIAGNOSIS — E041 Nontoxic single thyroid nodule: Secondary | ICD-10-CM

## 2020-12-04 DIAGNOSIS — D6859 Other primary thrombophilia: Secondary | ICD-10-CM | POA: Diagnosis not present

## 2020-12-04 DIAGNOSIS — E785 Hyperlipidemia, unspecified: Secondary | ICD-10-CM

## 2020-12-04 DIAGNOSIS — R413 Other amnesia: Secondary | ICD-10-CM | POA: Diagnosis not present

## 2020-12-04 DIAGNOSIS — R0602 Shortness of breath: Secondary | ICD-10-CM | POA: Diagnosis not present

## 2020-12-04 DIAGNOSIS — F332 Major depressive disorder, recurrent severe without psychotic features: Secondary | ICD-10-CM

## 2020-12-04 DIAGNOSIS — Z23 Encounter for immunization: Secondary | ICD-10-CM | POA: Diagnosis not present

## 2020-12-04 DIAGNOSIS — Z86711 Personal history of pulmonary embolism: Secondary | ICD-10-CM

## 2020-12-04 DIAGNOSIS — I2782 Chronic pulmonary embolism: Secondary | ICD-10-CM

## 2020-12-04 DIAGNOSIS — I471 Supraventricular tachycardia: Secondary | ICD-10-CM | POA: Diagnosis not present

## 2020-12-04 NOTE — Progress Notes (Signed)
Subjective:   By signing my name below, I, [name], attest that this documentation has been prepared under the direction and in the presence of Dr. Roma Schanz, DO. 12/04/2020    Patient ID: Beverly Cole, female    DOB: Jan 06, 1961, 60 y.o.   MRN: [id number]  Chief Complaint  Patient presents with   FEELS WEAK AND TIRED   NECK LOOKS SWOLLEN   MEMORY ISSUES    HPI Patient is in today for a office visit. Her husband is present with her during this visit.   She complains of feeling weak and tired for past 2 weeks. Her husband reports she had these symptoms longer. She has SOB due to walking long distances or exertion. She has a family history of lung cancer.  She also complains of neck swelling. She has swollen nodules on her thyroid but has not had them checked lately. She has decreased appetite and has lost weight as a result of it.  She complains of being easily forgetful. She reports forgetting if she had a bowel movement while having one. Her husband reports she easily forgets about conversations they just had.  Her blood pressure is elevated during this visit. She does not regularly check her blood pressure at home.   BP Readings from Last 3 Encounters:  12/04/20 (!) 144/91  04/24/20 128/78  12/20/19 106/74   Pulse Readings from Last 3 Encounters:  12/04/20 93  07/16/20 65  04/24/20 74   She reports she is on 5 mg Zyprexa daily PO and reports doing well while taking it. She continues taking 0.5 mg xanax PRN, 200 mg Wellbutrin daily PO and reports no new issues while taking it.  She is requesting a handicap tag for their vehicle due to struggling to walk long distances without feeling tired.  She has received the flu vaccine during this visit. She is eligible for the pneumonia vaccine and is interested in getting it during this visit.    Past Medical History:  Diagnosis Date   Anxiety    Atrial fibrillation ([place])    a. Event monitor 2013 -  PACs/bradycardia/SVT/short run of atrial fib by event monitor.    BIPOLAR DISORDER UNSPECIFIED    Bradycardia    Bursitis of hip 09/1999   Bilateral - Dr. Berenice Primas   DEPRESSION    Emphysema lung ([place]) 06/20/2016   pt states pulmonalongist stated started recently   GERD    Headache(784.0)    was with menstrual cycle. no longer a problem   HYPERLIPIDEMIA    Hypertension    Hypertension    IRRITABLE BOWEL SYNDROME, HX OF    Menopausal state 01/2012   [facility] = 88.5   Mitral valve prolapse 12/17/2000   a. dx 1980s, most recent echo did not demonstrate this.   MYALGIA    Normal coronary arteries    a. by cardiac CT 2013.   PAT (paroxysmal atrial tachycardia) (HCC)    PE (pulmonary embolism) 02/09/2015   a. Bilateral PEs 01/2015 when d-dimer 0.69, CP lying on left side. Followed by heme-onc - + lupus anticoagulant, mildly depressed protein S. Dr. Marin Olp is not certain if her hypercoagulable studies are significant for a thrombophilic state.   Premature atrial contractions    Pulmonary embolus ([place]) 02/10/2015   Shortness of breath    Pulmonary eval 05/2002   Thyroid nodule 2014   Tremors of nervous system     Past Surgical History:  Procedure Laterality Date   COLONOSCOPY  LAPAROSCOPIC APPENDECTOMY N/A 03/17/2017   Procedure: APPENDECTOMY LAPAROSCOPIC;  Surgeon: Excell Seltzer, MD;  Location: [facility];  Service: General;  Laterality: N/A;   Lipoma (R) side  1990   Right back    Family History  Problem Relation Age of Onset   Lung cancer Mother    Hypertension Mother    Hyperlipidemia Mother    Cancer Father        Esophageal cancer   Hyperlipidemia Father    Depression Father    Cancer Brother    Pulmonary embolism Brother    Colon cancer Paternal Uncle    Sarcoidosis Other    Testicular cancer Other     Social History   Socioeconomic History   Marital status: Married    Spouse name: Not on file   Number of children: 0   Years of education: Not on file   Highest  education level: Not on file  Occupational History   Occupation: Nurse-Personal Care/HH    Employer: UNEMPLOYED  Tobacco Use   Smoking status: Former    Packs/day: 1.00    Years: 25.00    Pack years: 25.00    Types: Cigarettes    Quit date: 02/11/1996    Years since quitting: 24.8   Smokeless tobacco: Never   Tobacco comments:    Married, lives with spouse. 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 TAKE 1 TABLET 30 MINUTES PRIOR TO PROCEDURE 30 tablet 0   aspirin EC 325 MG tablet Take 325 mg by mouth daily.     buPROPion (WELLBUTRIN SR) 200 MG 12 hr tablet Take 1 tablet (200 mg total) by mouth every morning. 90 tablet 0   dexlansoprazole (DEXILANT) 60 MG capsule TAKE 1 CAPSULE (60 MG TOTAL) BY MOUTH DAILY. 90 capsule 3   diltiazem (CARTIA XT) 180 MG 24 hr capsule Take 1 capsule (180 mg total) by mouth daily. 90 capsule 1   fexofenadine (ALLEGRA ALLERGY) 180 MG tablet Take 1 tablet (180 mg total) by mouth daily.     fluticasone (FLONASE) 50 MCG/ACT nasal spray Place 2 sprays into the nose daily as needed. For allergies      gabapentin (NEURONTIN) 100 MG capsule Take 1-3 capsules (100-300 mg total) by mouth at night as needed for restless leg 90 capsule 2   ketoconazole (NIZORAL) 2 % cream Apply 1 application topically daily. 15 g 1   modafinil (PROVIGIL) 200 MG tablet Take 1.5 tablets (300 mg total) by mouth  in the morning. (Patient taking differently: Take 200 mg by mouth in the morning.) 135 tablet 0   mupirocin ointment (BACTROBAN) 2 % Apply 1 application topically daily. 22 g 2   NONFORMULARY OR COMPOUNDED ITEM sm Azelaic acid/ metronidazole/ivermectin 15%/1%/1% cream apply to face bid 1 each 5   OLANZapine (ZYPREXA) 5 MG tablet Take 1 tablet (5 mg total) by mouth at bedtime. 30 tablet 2   Probiotic Product (PROBIOTIC DAILY PO) Take 1 tablet by mouth daily.      propranolol (INDERAL) 20 MG tablet TAKE 1 TABLET (20 MG TOTAL) BY MOUTH 2 (TWO) TIMES DAILY. 180 tablet 3   sertraline (ZOLOFT) 50 MG tablet Take 1 and 1/2 tablets by mouth daily (Patient taking differently: Take 50 mg by mouth daily.) 135 tablet 0   tretinoin (RETIN-A) 0.025 % cream as needed.   4   trimethoprim (TRIMPEX) 100 MG tablet TAKE AFTER INTERCOURSE AS DIRECTED 30 tablet 2   divalproex (DEPAKOTE ER) 500 MG 24 hr tablet Take 2 tablets (1,000 mg total) by mouth at bedtime. 180 tablet 0   mupirocin ointment (BACTROBAN) 2 % Apply 1 application topically daily. 22 g 0   No facility-administered medications prior to visit.    Allergies  Allergen Reactions   Cariprazine Other (See Comments)    Patient becomes suicidal when taking this medication.  Patient becomes suicidal when taking this medication.    Metoprolol Other (See Comments)    Hallucinations hair falls out Hallucinations hair falls out   Nickel Rash   Iron Nausea And Vomiting    Review of Systems  Constitutional:  Positive for malaise/fatigue and weight loss.  Respiratory:  Positive for shortness of breath.   Musculoskeletal:        (+)neck swelling  Psychiatric/Behavioral:   Positive for memory loss.       Objective:    Physical Exam Constitutional:      General: She is not in acute distress.    Appearance: Normal appearance. She is not ill-appearing.  HENT:     Head: Normocephalic and atraumatic.     Right Ear: External ear normal.     Left Ear: External ear normal.  Eyes:     Extraocular Movements: Extraocular movements intact.     Pupils: Pupils are equal, round, and reactive to light.  Neck:     Thyroid: Thyromegaly present.  Cardiovascular:     Rate and Rhythm: Normal rate and regular rhythm.     Heart sounds: Normal heart sounds. No murmur heard.   No gallop.  Pulmonary:     Effort: Pulmonary effort is normal. No respiratory distress.     Breath sounds: Normal breath sounds. No wheezing or rales.  Skin:    General: Skin is warm and dry.  Neurological:     Mental Status: She is alert and oriented to person, place, and time.  Psychiatric:        Behavior: Behavior normal.        Judgment: Judgment normal.    BP (!) 144/91 (BP Location: Right Arm, Cuff Size: Normal)   Pulse 93   Temp 98.2 F (36.8 C) (Oral)   Resp 12   Ht '5\' 9"'  (1.753 m)   Wt 177 lb (80.3 kg)   LMP 05/11/2012   SpO2 100%   BMI 26.14 kg/m  Wt Readings from Last 3 Encounters:  12/04/20 177 lb (80.3 kg)  04/24/20 183 lb 9.6 oz (83.3 kg)  12/20/19 179 lb (81.2 kg)  Diabetic Foot Exam - Simple   No data filed    Lab Results  Component Value Date   WBC 9.7 12/04/2020   HGB 13.9 12/04/2020   HCT 43.6 12/04/2020   PLT 395.0 12/04/2020   GLUCOSE 128 (H) 12/04/2020   CHOL 321 (H) 04/14/2019   TRIG 359.0 (H) 04/14/2019   HDL 43.00 04/14/2019   LDLDIRECT 227.0 04/14/2019   LDLCALC 154 (H) 11/26/2017   ALT 28 12/04/2020   AST 28 12/04/2020   NA 139 12/04/2020   K 4.1 12/04/2020   CL 104 12/04/2020   CREATININE 1.07 12/04/2020   BUN 11 12/04/2020   CO2 25 12/04/2020   TSH 2.87 12/04/2020   INR 1.10 02/09/2015   HGBA1C 5.3 03/20/2009   MICROALBUR 0.6  05/19/2011    Lab Results  Component Value Date   TSH 2.87 12/04/2020   Lab Results  Component Value Date   WBC 9.7 12/04/2020   HGB 13.9 12/04/2020   HCT 43.6 12/04/2020   MCV 84.3 12/04/2020   PLT 395.0 12/04/2020   Lab Results  Component Value Date   NA 139 12/04/2020   K 4.1 12/04/2020   CHLORIDE 110 (H) 01/16/2016   CO2 25 12/04/2020   GLUCOSE 128 (H) 12/04/2020   BUN 11 12/04/2020   CREATININE 1.07 12/04/2020   BILITOT 0.3 12/04/2020   ALKPHOS 88 12/04/2020   AST 28 12/04/2020   ALT 28 12/04/2020   PROT 7.9 12/04/2020   ALBUMIN 5.0 12/04/2020   CALCIUM 10.1 12/04/2020   ANIONGAP 8 12/20/2019   EGFR 75 (L) 01/16/2016   GFR 56.25 (L) 12/04/2020   Lab Results  Component Value Date   CHOL 321 (H) 04/14/2019   Lab Results  Component Value Date   HDL 43.00 04/14/2019   Lab Results  Component Value Date   LDLCALC 154 (H) 11/26/2017   Lab Results  Component Value Date   TRIG 359.0 (H) 04/14/2019   Lab Results  Component Value Date   CHOLHDL 7 04/14/2019   Lab Results  Component Value Date   HGBA1C 5.3 03/20/2009       Assessment & Plan:   Problem List Items Addressed This Visit       Unprioritized   Chronic saddle pulmonary embolism with acute cor pulmonale ([place])   History of pulmonary embolus - Dec 2017    Stable  con't eliquis      Hyperlipidemia LDL goal <100    Encourage heart healthy diet such as MIND or DASH diet, increase exercise, avoid trans fats, simple carbohydrates and processed foods, consider a krill or fish or flaxseed oil cap daily.       Memory loss   Relevant Orders   Ambulatory referral to Neuropsychology   Other fatigue    Check labs       Relevant Orders   CBC with Differential/Platelet (Completed)   Comprehensive metabolic panel (Completed)   Vitamin B12 (Completed)   Thyroid Panel With TSH (Completed)   DG Chest 2 View   Protein S deficiency (HCC)   Severe recurrent major depression without psychotic  features ([place])    Per psych      SOB (shortness of breath)   Relevant Orders   DG Chest 2 View   SVT (supraventricular tachycardia) (HCC)    Stable Per card      Thyroid nodule    Check [place] and labs      Relevant Orders   CBC with Differential/Platelet (Completed)   Comprehensive  metabolic panel (Completed)   Vitamin B12 (Completed)   Thyroid Panel With TSH (Completed)   US THYROID   Other Visit Diagnoses     Influenza vaccine administered    -  Primary   Relevant Orders   Flu Vaccine QUAD 36+ mos IM (Fluarix, Fluzone & Afluria Quad PF (Completed)   Need for pneumococcal vaccination       Relevant Orders   Pneumococcal conjugate vaccine 20-valent (Prevnar 20) (Completed)   Bipolar disorder with severe depression ([place])   (Chronic)          No orders of the defined types were placed in this encounter.   I, Dr. Roma Schanz, DO, personally preformed the services described in this documentation.  All medical record entries made by the scribe were at my direction and in my presence.  I have reviewed the chart and discharge instructions (if applicable) and agree that the record reflects my personal performance and is accurate and complete. 12/04/2020   I,[name],acting as a scribe for Ann Held, DO.,have documented all relevant documentation on the behalf of Dewanna Hurston, DO,as directed by  Ann Held, DO while in the presence of Ann Held, DO.   Ann Held, DO

## 2020-12-04 NOTE — Patient Instructions (Signed)

## 2020-12-05 ENCOUNTER — Encounter: Payer: 59 | Admitting: Dermatology

## 2020-12-05 ENCOUNTER — Ambulatory Visit (INDEPENDENT_AMBULATORY_CARE_PROVIDER_SITE_OTHER): Payer: 59 | Admitting: Psychology

## 2020-12-05 DIAGNOSIS — F331 Major depressive disorder, recurrent, moderate: Secondary | ICD-10-CM

## 2020-12-05 LAB — COMPREHENSIVE METABOLIC PANEL
ALT: 28 U/L (ref 0–35)
AST: 28 U/L (ref 0–37)
Albumin: 5 g/dL (ref 3.5–5.2)
Alkaline Phosphatase: 88 U/L (ref 39–117)
BUN: 11 mg/dL (ref 6–23)
CO2: 25 mEq/L (ref 19–32)
Calcium: 10.1 mg/dL (ref 8.4–10.5)
Chloride: 104 mEq/L (ref 96–112)
Creatinine, Ser: 1.07 mg/dL (ref 0.40–1.20)
GFR: 56.25 mL/min — ABNORMAL LOW (ref >60.00)
Glucose, Bld: 128 mg/dL — ABNORMAL HIGH (ref 70–99)
Potassium: 4.1 mEq/L (ref 3.5–5.1)
Sodium: 139 mEq/L (ref 135–145)
Total Bilirubin: 0.3 mg/dL (ref 0.2–1.2)
Total Protein: 7.9 g/dL (ref 6.0–8.3)

## 2020-12-05 LAB — CBC WITH DIFFERENTIAL/PLATELET
Basophils Absolute: 0.1 10*3/uL (ref 0.0–0.1)
Basophils Relative: 0.8 % (ref 0.0–3.0)
Eosinophils Absolute: 0.2 10*3/uL (ref 0.0–0.7)
Eosinophils Relative: 1.7 % (ref 0.0–5.0)
HCT: 43.6 % (ref 36.0–46.0)
Hemoglobin: 13.9 g/dL (ref 12.0–15.0)
Lymphocytes Relative: 35.5 % (ref 12.0–46.0)
Lymphs Abs: 3.4 10*3/uL (ref 0.7–4.0)
MCHC: 31.9 g/dL (ref 30.0–36.0)
MCV: 84.3 fl (ref 78.0–100.0)
Monocytes Absolute: 0.6 10*3/uL (ref 0.1–1.0)
Monocytes Relative: 5.8 % (ref 3.0–12.0)
Neutro Abs: 5.4 10*3/uL (ref 1.4–7.7)
Neutrophils Relative %: 56.2 % (ref 43.0–77.0)
Platelets: 395 10*3/uL (ref 150.0–400.0)
RBC: 5.18 Mil/uL — ABNORMAL HIGH (ref 3.87–5.11)
RDW: 16.3 % — ABNORMAL HIGH (ref 11.5–15.5)
WBC: 9.7 10*3/uL (ref 4.0–10.5)

## 2020-12-05 LAB — THYROID PANEL WITH TSH
Free Thyroxine Index: 2 (ref 1.4–3.8)
T3 Uptake: 26 % (ref 22–35)
T4, Total: 7.5 ug/dL (ref 5.1–11.9)
TSH: 2.87 mIU/L (ref 0.40–4.50)

## 2020-12-05 LAB — VITAMIN B12: Vitamin B-12: 429 pg/mL (ref 211–911)

## 2020-12-10 ENCOUNTER — Encounter: Payer: Self-pay | Admitting: Family Medicine

## 2020-12-10 DIAGNOSIS — E041 Nontoxic single thyroid nodule: Secondary | ICD-10-CM | POA: Insufficient documentation

## 2020-12-10 DIAGNOSIS — D6859 Other primary thrombophilia: Secondary | ICD-10-CM | POA: Insufficient documentation

## 2020-12-10 DIAGNOSIS — R5383 Other fatigue: Secondary | ICD-10-CM | POA: Insufficient documentation

## 2020-12-10 NOTE — Assessment & Plan Note (Signed)
Stable Per card 

## 2020-12-10 NOTE — Assessment & Plan Note (Signed)
Per psych 

## 2020-12-10 NOTE — Assessment & Plan Note (Signed)
Stable  con't eliquis

## 2020-12-10 NOTE — Assessment & Plan Note (Signed)
Check labs 

## 2020-12-10 NOTE — Assessment & Plan Note (Signed)
Encourage heart healthy diet such as MIND or DASH diet, increase exercise, avoid trans fats, simple carbohydrates and processed foods, consider a krill or fish or flaxseed oil cap daily.  °

## 2020-12-10 NOTE — Assessment & Plan Note (Signed)
Check [place] and labs

## 2020-12-20 ENCOUNTER — Ambulatory Visit (INDEPENDENT_AMBULATORY_CARE_PROVIDER_SITE_OTHER): Payer: 59 | Admitting: Psychology

## 2020-12-20 ENCOUNTER — Other Ambulatory Visit: Payer: Self-pay | Admitting: Psychiatry

## 2020-12-20 DIAGNOSIS — F331 Major depressive disorder, recurrent, moderate: Secondary | ICD-10-CM | POA: Diagnosis not present

## 2020-12-20 DIAGNOSIS — F4001 Agoraphobia with panic disorder: Secondary | ICD-10-CM

## 2020-12-20 DIAGNOSIS — F411 Generalized anxiety disorder: Secondary | ICD-10-CM

## 2020-12-20 DIAGNOSIS — F314 Bipolar disorder, current episode depressed, severe, without psychotic features: Secondary | ICD-10-CM

## 2020-12-21 ENCOUNTER — Ambulatory Visit (HOSPITAL_BASED_OUTPATIENT_CLINIC_OR_DEPARTMENT_OTHER)
Admission: RE | Admit: 2020-12-21 | Discharge: 2020-12-21 | Disposition: A | Discharge status: Home / Self Care | Payer: 59 | Source: Ambulatory Visit | Attending: Family Medicine | Admitting: Family Medicine

## 2020-12-21 ENCOUNTER — Other Ambulatory Visit: Payer: Self-pay

## 2020-12-21 ENCOUNTER — Other Ambulatory Visit (HOSPITAL_COMMUNITY): Payer: Self-pay

## 2020-12-21 DIAGNOSIS — E041 Nontoxic single thyroid nodule: Secondary | ICD-10-CM | POA: Insufficient documentation

## 2020-12-21 DIAGNOSIS — R5383 Other fatigue: Secondary | ICD-10-CM

## 2020-12-21 DIAGNOSIS — R0602 Shortness of breath: Secondary | ICD-10-CM

## 2020-12-21 DIAGNOSIS — E042 Nontoxic multinodular goiter: Secondary | ICD-10-CM | POA: Diagnosis not present

## 2020-12-21 MED ORDER — SERTRALINE HCL 50 MG PO TABS
75.0000 mg | ORAL_TABLET | Freq: Every day | ORAL | 0 refills | Status: DC
  Filled 2020-12-21: qty 135, 90d supply, fill #0

## 2020-12-21 NOTE — Telephone Encounter (Signed)
90 day ok?appt is 12/14

## 2020-12-28 ENCOUNTER — Other Ambulatory Visit: Payer: Self-pay | Admitting: Psychiatry

## 2020-12-28 ENCOUNTER — Other Ambulatory Visit (HOSPITAL_COMMUNITY): Payer: Self-pay

## 2020-12-28 DIAGNOSIS — F314 Bipolar disorder, current episode depressed, severe, without psychotic features: Secondary | ICD-10-CM

## 2020-12-28 MED ORDER — MODAFINIL 200 MG PO TABS
300.0000 mg | ORAL_TABLET | Freq: Every morning | ORAL | 0 refills | Status: DC
  Filled 2020-12-28 – 2021-01-01 (×3): qty 135, 90d supply, fill #0

## 2020-12-28 MED ORDER — BUPROPION HCL ER (SR) 200 MG PO TB12
200.0000 mg | ORAL_TABLET | Freq: Every morning | ORAL | 0 refills | Status: DC
Start: 2020-12-28 — End: 2021-03-24
  Filled 2020-12-28: qty 90, 90d supply, fill #0

## 2021-01-01 ENCOUNTER — Other Ambulatory Visit (HOSPITAL_COMMUNITY): Payer: Self-pay

## 2021-01-10 ENCOUNTER — Encounter: Payer: Self-pay | Admitting: Dermatology

## 2021-01-10 ENCOUNTER — Other Ambulatory Visit: Payer: Self-pay

## 2021-01-10 ENCOUNTER — Ambulatory Visit (INDEPENDENT_AMBULATORY_CARE_PROVIDER_SITE_OTHER): Payer: 59 | Admitting: Dermatology

## 2021-01-10 DIAGNOSIS — D2371 Other benign neoplasm of skin of right lower limb, including hip: Secondary | ICD-10-CM

## 2021-01-10 DIAGNOSIS — Z1283 Encounter for screening for malignant neoplasm of skin: Secondary | ICD-10-CM

## 2021-01-10 DIAGNOSIS — D229 Melanocytic nevi, unspecified: Secondary | ICD-10-CM | POA: Diagnosis not present

## 2021-01-10 DIAGNOSIS — D1801 Hemangioma of skin and subcutaneous tissue: Secondary | ICD-10-CM

## 2021-01-10 DIAGNOSIS — L578 Other skin changes due to chronic exposure to nonionizing radiation: Secondary | ICD-10-CM

## 2021-01-10 DIAGNOSIS — L814 Other melanin hyperpigmentation: Secondary | ICD-10-CM | POA: Diagnosis not present

## 2021-01-10 DIAGNOSIS — L738 Other specified follicular disorders: Secondary | ICD-10-CM | POA: Diagnosis not present

## 2021-01-10 DIAGNOSIS — L820 Inflamed seborrheic keratosis: Secondary | ICD-10-CM | POA: Diagnosis not present

## 2021-01-10 DIAGNOSIS — D239 Other benign neoplasm of skin, unspecified: Secondary | ICD-10-CM

## 2021-01-10 DIAGNOSIS — L821 Other seborrheic keratosis: Secondary | ICD-10-CM | POA: Diagnosis not present

## 2021-01-10 NOTE — Patient Instructions (Addendum)
Cryotherapy Aftercare  Wash gently with soap and water everyday.   Apply Vaseline and Band-Aid daily until healed.   Prior to procedure, discussed risks of blister formation, small wound, skin dyspigmentation, or rare scar following cryotherapy. Recommend Vaseline ointment to treated areas while healing.  Melanoma ABCDEs  Melanoma is the most dangerous type of skin cancer, and is the leading cause of death from skin disease.  You are more likely to develop melanoma if you: Have light-colored skin, light-colored eyes, or red or blond hair Spend a lot of time in the sun Tan regularly, either outdoors or in a tanning bed Have had blistering sunburns, especially during childhood Have a close family member who has had a melanoma Have atypical moles or large birthmarks  Early detection of melanoma is key since treatment is typically straightforward and cure rates are extremely high if we catch it early.   The first sign of melanoma is often a change in a mole or a new dark spot.  The ABCDE system is a way of remembering the signs of melanoma.  A for asymmetry:  The two halves do not match. B for border:  The edges of the growth are irregular. C for color:  A mixture of colors are present instead of an even brown color. D for diameter:  Melanomas are usually (but not always) greater than 6mm - the size of a pencil eraser. E for evolution:  The spot keeps changing in size, shape, and color.  Please check your skin once per month between visits. You can use a small mirror in front and a large mirror behind you to keep an eye on the back side or your body.   If you see any new or changing lesions before your next follow-up, please call to schedule a visit.  Please continue daily skin protection including broad spectrum sunscreen SPF 30+ to sun-exposed areas, reapplying every 2 hours as needed when you're outdoors.    If You Need Anything After Your Visit  If you have any questions or  concerns for your doctor, please call our main line at [phone] and press option 4 to reach your doctor's medical assistant. If no one answers, please leave a voicemail as directed and we will return your call as soon as possible. Messages left after 4 pm will be answered the following business day.   You may also send us a message via MyChart. We typically respond to MyChart messages within 1-2 business days.  For prescription refills, please ask your pharmacy to contact our office. Our fax number is [phone].  If you have an urgent issue when the clinic is closed that cannot wait until the next business day, you can page your doctor at the number below.    Please note that while we do our best to be available for urgent issues outside of office hours, we are not available 24/7.   If you have an urgent issue and are unable to reach us, you may choose to seek medical care at your doctor's office, retail clinic, urgent care center, or emergency room.  If you have a medical emergency, please immediately call 911 or go to the emergency department.  Pager Numbers  - Dr. Kowalski: [phone]  - Dr. Moye: [phone]  - Dr. Stewart: [phone]  In the event of inclement weather, please call our main line at [phone] for an update on the status of any delays or closures.  Dermatology Medication Tips: Please keep the boxes that   topical medications come in in order to help keep track of the instructions about where and how to use these. Pharmacies typically print the medication instructions only on the boxes and not directly on the medication tubes.   If your medication is too expensive, please contact our office at [phone] option 4 or send us a message through MyChart.   We are unable to tell what your co-pay for medications will be in advance as this is different depending on your insurance coverage. However, we may be able to find a substitute medication at lower cost or  fill out paperwork to get insurance to cover a needed medication.   If a prior authorization is required to get your medication covered by your insurance company, please allow us 1-2 business days to complete this process.  Drug prices often vary depending on where the prescription is filled and some pharmacies may offer cheaper prices.  The website www.goodrx.com contains coupons for medications through different pharmacies. The prices here do not account for what the cost may be with help from insurance (it may be cheaper with your insurance), but the website can give you the price if you did not use any insurance.  - You can print the associated coupon and take it with your prescription to the pharmacy.  - You may also stop by our office during regular business hours and pick up a GoodRx coupon card.  - If you need your prescription sent electronically to a different pharmacy, notify our office through [facility] MyChart or by phone at [phone] option 4.     Si Usted Necesita Algo Despus de Su Visita  Tambin puede enviarnos un mensaje a travs de MyChart. Por lo general respondemos a los mensajes de MyChart en el transcurso de 1 a 2 das hbiles.  Para renovar recetas, por favor pida a su farmacia que se ponga en contacto con nuestra oficina. Nuestro nmero de fax es el [phone].  Si tiene un asunto urgente cuando la clnica est cerrada y que no puede esperar hasta el siguiente da hbil, puede llamar/localizar a su doctor(a) al nmero que aparece a continuacin.   Por favor, tenga en cuenta que aunque hacemos todo lo posible para estar disponibles para asuntos urgentes fuera del horario de oficina, no estamos disponibles las 24 horas del da, los 7 das de la semana.   Si tiene un problema urgente y no puede comunicarse con nosotros, puede optar por buscar atencin mdica  en el consultorio de su doctor(a), en una clnica privada, en un centro de atencin urgente o en una sala  de emergencias.  Si tiene una emergencia mdica, por favor llame inmediatamente al 911 o vaya a la sala de emergencias.  Nmeros de bper  - Dr. Kowalski: [phone]  - Dra. Moye: [phone]  - Dra. Stewart: [phone]  En caso de inclemencias del tiempo, por favor llame a nuestra lnea principal al [phone] para una actualizacin sobre el estado de cualquier retraso o cierre.  Consejos para la medicacin en dermatologa: Por favor, guarde las cajas en las que vienen los medicamentos de uso tpico para ayudarle a seguir las instrucciones sobre dnde y cmo usarlos. Las farmacias generalmente imprimen las instrucciones del medicamento slo en las cajas y no directamente en los tubos del medicamento.   Si su medicamento es muy caro, por favor, pngase en contacto con nuestra oficina llamando al [phone] y presione la opcin 4 o envenos un mensaje a travs de MyChart.   No   podemos decirle cul ser su copago por los medicamentos por adelantado ya que esto es diferente dependiendo de la cobertura de su seguro. Sin embargo, es posible que podamos encontrar un medicamento sustituto a menor costo o llenar un formulario para que el seguro cubra el medicamento que se considera necesario.   Si se requiere una autorizacin previa para que su compaa de seguros cubra su medicamento, por favor permtanos de 1 a 2 das hbiles para completar este proceso.  Los precios de los medicamentos varan con frecuencia dependiendo del lugar de dnde se surte la receta y alguna farmacias pueden ofrecer precios ms baratos.  El sitio web www.goodrx.com tiene cupones para medicamentos de diferentes farmacias. Los precios aqu no tienen en cuenta lo que podra costar con la ayuda del seguro (puede ser ms barato con su seguro), pero el sitio web puede darle el precio si no utiliz ningn seguro.  - Puede imprimir el cupn correspondiente y llevarlo con su receta a la farmacia.  - Tambin puede pasar  por nuestra oficina durante el horario de atencin regular y recoger una tarjeta de cupones de GoodRx.  - Si necesita que su receta se enve electrnicamente a una farmacia diferente, informe a nuestra oficina a travs de MyChart de [facility] o por telfono llamando al [phone] y presione la opcin 4.  

## 2021-01-10 NOTE — Progress Notes (Signed)
Follow-Up Visit   Subjective  Beverly Cole is a 60 y.o. female who presents for the following: FBSE (Patient here for full body skin exam and skin cancer screening. Patient with no hx of skin cancer or abnormal nevi. No new or changing spots patient is aware of but she has noticed some blood on sheets in the morning. ).  The following portions of the chart were reviewed this encounter and updated as appropriate:   Tobacco  Allergies  Meds  Problems  Med Hx  Surg Hx  Fam Hx      Review of Systems:  No other skin or systemic complaints except as noted in HPI or Assessment and Plan.  Objective  Well appearing patient in no apparent distress; mood and affect are within normal limits.  A full examination was performed including scalp, head, eyes, ears, nose, lips, neck, chest, axillae, abdomen, back, buttocks, bilateral upper extremities, bilateral lower extremities, hands, feet, fingers, toes, fingernails, and toenails. All findings within normal limits unless otherwise noted below.  Right Inframammary x 21, Left Inframammary x 24, left flank x 1, left post shoulder x 22 (46) Erythematous keratotic or waxy stuck-on papule or plaque.      Right pretibia Firm pink/brown papulenodule with dimple sign.   face Small yellow papules with a central dell.    Assessment & Plan  Inflamed seborrheic keratosis Right Inframammary x 21, Left Inframammary x 24, left flank x 1, left post shoulder x 22  Symptomatic  Prior to procedure, discussed risks of blister formation, small wound, skin dyspigmentation, or rare scar following cryotherapy. Recommend Vaseline ointment to treated areas while healing.  Recheck ISK photographed on follow up.    Destruction of lesion - Right Inframammary x 21, Left Inframammary x 24, left flank x 1, left post shoulder x 22  Destruction method: cryotherapy   Informed consent: discussed and consent obtained   Lesion destroyed using liquid nitrogen: Yes    Cryotherapy cycles:  2 Outcome: patient tolerated procedure well with no complications   Post-procedure details: wound care instructions given    Dermatofibroma Right pretibia  Benign-appearing.  Observation.  Call clinic for new or changing lesions.    Sebaceous hyperplasia face  Benign-appearing.  Observation.  Call clinic for new or changing lesions.    Related Medications mupirocin ointment (BACTROBAN) 2 % Apply 1 application topically daily.  Lentigines - Scattered tan macules - Due to sun exposure - Benign-appearing, observe - Recommend daily broad spectrum sunscreen SPF 30+ to sun-exposed areas, reapply every 2 hours as needed. - Call for any changes  Seborrheic Keratoses - Stuck-on, waxy, tan-brown papules and/or plaques  - Benign-appearing - Discussed benign etiology and prognosis. - Observe - Call for any changes  Melanocytic Nevi - Tan-brown and/or pink-flesh-colored symmetric macules and papules - Benign appearing on exam today - Observation - Call clinic for new or changing moles - Recommend daily use of broad spectrum spf 30+ sunscreen to sun-exposed areas.   Hemangiomas - Red papules - Discussed benign nature - Observe - Call for any changes  Actinic Damage - Chronic condition, secondary to cumulative UV/sun exposure - diffuse scaly erythematous macules with underlying dyspigmentation - Recommend daily broad spectrum sunscreen SPF 30+ to sun-exposed areas, reapply every 2 hours as needed.  - Staying in the shade or wearing long sleeves, sun glasses (UVA+UVB protection) and wide brim hats (4-inch brim around the entire circumference of the hat) are also recommended for sun protection.  - Call for  new or changing lesions.  Skin cancer screening performed today.  Return for sebaceous hyperplasia and recheck ISK, as scheduled.  Graciella Belton, RMA, am acting as scribe for Forest Gleason, MD .  Documentation: I have reviewed the above  documentation for accuracy and completeness, and I agree with the above.  Forest Gleason, MD

## 2021-01-16 ENCOUNTER — Other Ambulatory Visit: Payer: Self-pay | Admitting: Family Medicine

## 2021-01-16 ENCOUNTER — Other Ambulatory Visit: Payer: Self-pay | Admitting: Psychiatry

## 2021-01-16 DIAGNOSIS — F4000 Agoraphobia, unspecified: Secondary | ICD-10-CM

## 2021-01-16 DIAGNOSIS — N952 Postmenopausal atrophic vaginitis: Secondary | ICD-10-CM

## 2021-01-16 DIAGNOSIS — F4001 Agoraphobia with panic disorder: Secondary | ICD-10-CM

## 2021-01-16 MED FILL — Propranolol HCl Tab 20 MG: ORAL | 90 days supply | Qty: 180 | Fill #2 | Status: AC

## 2021-01-17 ENCOUNTER — Other Ambulatory Visit (HOSPITAL_COMMUNITY): Payer: Self-pay

## 2021-01-17 MED ORDER — TRETINOIN 0.025 % EX CREA
TOPICAL_CREAM | Freq: Every day | CUTANEOUS | 4 refills | Status: DC
  Filled 2021-01-17 – 2021-02-04 (×2): qty 45, 30d supply, fill #0
  Filled 2021-04-16: qty 45, 30d supply, fill #1
  Filled 2021-07-30: qty 45, 30d supply, fill #2
  Filled 2021-10-08 – 2021-12-18 (×3): qty 45, 30d supply, fill #3

## 2021-01-17 MED ORDER — ALPRAZOLAM 0.5 MG PO TABS
0.5000 mg | ORAL_TABLET | Freq: Two times a day (BID) | ORAL | 0 refills | Status: DC | PRN
  Filled 2021-01-17: qty 30, 15d supply, fill #0

## 2021-01-17 MED ORDER — TRIMETHOPRIM 100 MG PO TABS
ORAL_TABLET | ORAL | 2 refills | Status: DC
  Filled 2021-01-17: qty 30, 30d supply, fill #0
  Filled 2021-03-06: qty 30, 30d supply, fill #1
  Filled 2021-07-30 – 2021-10-22 (×3): qty 30, 30d supply, fill #2

## 2021-01-22 ENCOUNTER — Ambulatory Visit (INDEPENDENT_AMBULATORY_CARE_PROVIDER_SITE_OTHER): Payer: 59 | Admitting: Psychology

## 2021-01-22 ENCOUNTER — Telehealth: Payer: Self-pay

## 2021-01-22 DIAGNOSIS — F331 Major depressive disorder, recurrent, moderate: Secondary | ICD-10-CM | POA: Diagnosis not present

## 2021-01-22 NOTE — Telephone Encounter (Signed)
Sent to plan  Key: [id number]

## 2021-01-22 NOTE — Progress Notes (Signed)
[facility] Counselor/Therapist Progress Note  Patient ID: Beverly Cole, MRN: [id number],    Date: 01/22/2021  Time Spent: 3:00pm-4:00pm   60 minutes   Treatment Type: Individual Therapy  Reported Symptoms: worrying  Mental Status Exam: Appearance:  Casual     Behavior: Appropriate  Motor: Normal  Speech/Language:  Normal Rate  Affect: Appropriate  Mood: normal  Thought process: normal  Thought content:   WNL  Sensory/Perceptual disturbances:   WNL  Orientation: oriented to person, place, time/date, and situation  Attention: Good  Concentration: Good  Memory: WNL  Fund of knowledge:  Good  Insight:   Good  Judgment:  Good  Impulse Control: Good   Risk Assessment: Danger to Self:  No Self-injurious Behavior: No Danger to Others: No Duty to Warn:no Physical Aggression / Violence:No  Access to Firearms a concern: No  Gang Involvement:No   Subjective: Pt present for face-to-face therapy via Webex.  Pt consents to telehealth video session due to COVID-19 pandemic.     Location of pt: home.  Location of therapist: home office Pt talked about the kitten she adopted.  The kitten is very loving which is therapeutic for pt.   Pt's mood has been more elevated since having the kitten.  She and her husband are enjoying the kitten.   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  She enjoys giving in that way.   Pt talked about her health.  She has had to have a lot of skin tags and moles removed from her back.  Addressed pt's health concerns.   Provided supportive counseling.   Interventions: Cognitive Behavioral Therapy and Insight-Oriented  Diagnosis: F33.1  Plan: See pt's Treatment Plan for depression in Therapy Charts.  (Treatment Plan Target Date: 10/25/2021) Pt is progressing with treatment goals.   Plan to continue to see pt every month.   Daylyn Christine, LCSW

## 2021-01-23 ENCOUNTER — Encounter: Payer: Self-pay | Admitting: Psychiatry

## 2021-01-23 ENCOUNTER — Telehealth (INDEPENDENT_AMBULATORY_CARE_PROVIDER_SITE_OTHER): Payer: 59 | Admitting: Psychiatry

## 2021-01-23 DIAGNOSIS — F423 Hoarding disorder: Secondary | ICD-10-CM

## 2021-01-23 DIAGNOSIS — F314 Bipolar disorder, current episode depressed, severe, without psychotic features: Secondary | ICD-10-CM | POA: Diagnosis not present

## 2021-01-23 DIAGNOSIS — F411 Generalized anxiety disorder: Secondary | ICD-10-CM | POA: Diagnosis not present

## 2021-01-23 DIAGNOSIS — F40240 Claustrophobia: Secondary | ICD-10-CM

## 2021-01-23 DIAGNOSIS — F4001 Agoraphobia with panic disorder: Secondary | ICD-10-CM | POA: Diagnosis not present

## 2021-01-23 DIAGNOSIS — G2581 Restless legs syndrome: Secondary | ICD-10-CM | POA: Diagnosis not present

## 2021-01-23 NOTE — Telephone Encounter (Signed)
PA Approval  The request has been approved. The authorization is effective for a maximum of 12 fills from 01/22/2021 to 01/21/2022, as long as the member is enrolled in their current health plan.

## 2021-01-23 NOTE — Progress Notes (Signed)
Beverly Cole [id number] [birth year] 60 y.o.  Video Visit via My Chart  I connected with pt by My Chart and verified that I am speaking with the correct person using two identifiers.   I discussed the limitations, risks, security and privacy concerns of performing an evaluation and management service by My Chart  and the availability of in person appointments. I also discussed with the patient that there may be a patient responsible charge related to this service. The patient expressed understanding and agreed to proceed.  I discussed the assessment and treatment plan with the patient. The patient was provided an opportunity to ask questions and all were answered. The patient agreed with the plan and demonstrated an understanding of the instructions.   The patient was advised to call back or seek an in-person evaluation if the symptoms worsen or if the condition fails to improve as anticipated.  I provided 30 minutes of video time during this encounter.  The patient was located at home and the provider was located office. Session started at 330 and ended at 400  Subjective:   Patient ID:  Beverly Cole is a 60 y.o. (DOB May 28, 1960) female.  Chief Complaint:  Chief Complaint  Patient presents with   Follow-up    Bipolar disorder with severe depression ([place])   Depression   Anxiety   Sleeping Problem    Depression        Associated symptoms include decreased concentration and fatigue.  Associated symptoms include no suicidal ideas.  Past medical history includes anxiety.   Anxiety Symptoms include decreased concentration, dizziness, nervous/anxious behavior and shortness of breath. Patient reports no chest pain, confusion or suicidal ideas.    Beverly Cole  today for follow-up of chronic depression and anxiety.  Lately depression worse than anxiety.  Seen with husband today  When seen May 11, 2018.  For persistent anxiety we elected to retry buspirone and try increasing it to 30  mg twice daily if tolerated.   Couldn't tolerate it this time DT muscle spasms.  Pharmacy called saying she could have serotonin syndrome.  When seen August 09, 2018 and she refused med changes despite chronic anxiety and depression. She remained on sertraline 50, Depakote ER 1000 mg, Wellbutrin SR 200 mg AM  Panic wearing cloth masks.  Using a shield.  Not in the office today bc H exposed to Covid.    seen December 09, 2018.  Because of chronic depression and dysfunction with fatigue and poor motivation we decided off label trial  trial of modafinil 200 mg 1/2 each am for 1 week then 200 mg each AM. Did see benefit from it.  More energy and working to stay out of bed more.    seen January 10, 2019.  The modafinil had been helpful.  No meds were changed.  I seen April 07, 2019.  The following was noted: Still sees benefit from modafinil but still not caring for herself like she should.  Not wanting to go anywhere.  Not driven since early fall.  Hard to breathe with the mask and it creates anxiety.  Hard to be in enclosed spaces like cars and planes for extended periods.   Trying to set her alarm clock.  No hallucinations.  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  Plans to send more too.  Still follow through is not great.  Still depression and productivity is still poor  but it is not as poor as it was before modafinil. Plan increase modafinil to 300 mg daily to see if function can be improved.  06/29/2019 appointment, the following is noted: Rare Xanax.  Did increase modafinil to 300 mg daily. Saw benefit with the increase with less time in bed but still markedly functionally impaired.   It seems like I get used to it and then doesn't maintain her get up and go.  Still having a hard time with self care like hygiene. Chronic low motivation and lack of interest is unchanged.   Gall bladder problems.  It's better at the moment.     Not more sad, just like I've always been.  Can't make herself leave the house DT depression and anxiety.  She won't do chores.   Less excess sleeping.  He works from home 2 days weekly.  No periods of hyperactivity or manic sx since the spring. Attending therapy every 2 weeks.   Can be confused when first wakes up regardless of time of day. Had episode of hallucination in the middle of the night when awakened. Scarecrow frightened her. This is rare event.  No hallucinations during the day.  Melatonin makes it worse. Has had fearful thoughts of planes crashing or bad things happening to others and it might be her fault.  Fight with brother lately and not speaking with him now. Plan:  No med changes  10/11/19 appt with the following noted: Unsteady and tremors gotten worse.  Not gone to doctor. Propranolol not helping as much.   On Depakote ER 1000mg  HS which is a reduction. So tired all the time and H says she's not doing anything.  H says accomplishing littte and still lays in bed a lot.  H says it's 2-3 PM before she gets OOB.  Memory is poor.  Everything is such a chore and doesn't want to do things. H says she starts things she doesn't finish.  Lots of new projects.   Increase modafinil on her own to 400 mg daily in the AM.  Anxious.  Rare Xanax.  Was happy when got new cats in the summer.  Anxious and Depressed and describes anxiety as Moderate. Anxiety symptoms include: Excessive Worry,.  She thinks sertraline reduces her obsessiveness.  Past hx panic so avoidant. Never get to relax.  Pt reports sleeps excessively even with modafinil. Pt reports that appetite is good. Pt reports that energy is poor and loss of interest or pleasure in usual activities, poor motivation and withdrawn from usual activities. Just don't care about things and admits to a lot of general anxiety and everything takes a lot of energy out of her.  Compulsively tapes and watches TV shows, news and awards shows.  Can't  delete it bc I'll miss something.  Watches TV in bed. Concentration is poor. Suicidal thoughts:  denied by patient. But chronic death thoughts.  Poor productivity overall.  Chronic poor self care, showers only weekly.  Just don't care.  Chronic disability.  She thinks sertraline helped the anxiety some.  Likes that H is at home.  Helps her mood.  Doesn't care about going out. Feels faint when she tries to wear a mask.  Too anxious driving and is avoidant. Collects TV shows, she can't erase unless she watches entirely. Hoards some bc afraid she might need it some day.   Rare use of Xanax bc fears addiction. Plan: Attempted referral to neurology at [facility].  They refused to see patient stating  they had nothing to offer her. It was suggested to patient that she try to get in with Dr. Jannifer Franklin whom she had seen in the past.  01/10/2020 appointment with the following noted: Reduced modafinil to 300 mg bc didn't want to get hooked on anything about 2-3 weeks.  Did initially see benefit from the modafinil but seemed to get tolerant to it. Feels better not talking to brother bc differing views of politics.  Feels he's a negative person but never depressed. Overall thinks she's doing pretty well relatively.  However is chronically depressed and never happy.  Nothing changed with the meds. Depression worse than anxiety. H didn't think modafinil made much difference.  H CO her inactivity.. She felt it was helpful for energy initially. Had to cancel trips bc can't wear a mask for that long.  Makes her sad. Driven twice in a year or so DT anxiety. Wants prn Xanax. Plan: DC sertraline Start fluoxetine 20 mg daily with olanzapine 5 mg daily.  05/22/2020 appointment with following noted: Didn't remember to stop sertraline and took fluoxetine and olanzapine for 2 days and couldn't eat and stopped it. Then went to dermatologist December who told her couldn't take Zoloft with fungus med and stopped  sertraline. Then became more depressed.  I have to go back on the Zoloft. Remained on modafinil, Wellbutrin, Depakote.  Even less motivated to do anything like cook, clean.  Won't go to [employer] etc. Chronic depression with hopelessness to get better Plan: No med changes  07/31/2020 appointment with the following noted: Back on sertraline at 75 mg daily with Remained on modafinil, Wellbutrin, Depakote.  Aunt died and one of worst fears but not thinking about it. Family got Covid but she recovered.  She thought it might kill and didn't care. Tremors are getting worse and balance problems. Went to concert and that was a problem.  Enjoyed the concert. Also saw Kathrene Bongo.  Music always important to her.  1980 had planned to commit suicide listening to an album over and over but didn't attempt. B and M have familial tremor also. Still anxious and depressed and haven't driven in a year DT anxiety. Plan: Option of trial olanzapine alone without fluoxetine.  Rec retry.  She agrees.  10/23/2020 appointment with the following noted: "Olanzapine is really working."  Initial SE drunk at 5 mg HS so cut it in half.  Got used to it but I feel happy and more relaxed and easier time going to sleep.  If I could still chose to die I would but clearly happier but not more hopeful.  Laughing more.  No SI. Needs to care about things more.  Chronic lack of motivations. Still doesn't want to leave the house but did enjoy a concert in [place]. Chronic unsteadiness is worse.  Last disc with doctor in March who rec PT but she never did the PT.  Misses aunt who died.   She reduced the Depakote to 500 mg HS. When stopped sertraline was not doing well and restarted it bc stressed and unhappy.  Seems to be a key med for me. Pending card test soon.  Feels better than in the past. RLS is worse after starting olanzapine and even affecting arms but had it before.  Happens after lays down at night.  Takes MG which helps.    Xanax about once every 3 weeks. Plan: Successful trial olanzapine 2.5 mg with sertraline 50 but worse RLS Trial gabapentin for RLS 100-300 mg PM  and disc SE.  Higher if needed..  Once controlled consider increase olanzapine.  01/23/2021 appointment with the following noted: Taking gabapentin 300 mg but needs to go higher for RLS.  SE dizzy and reduced concentration. Reads on internet.   Increased olanzapine to 5 mg HS a couple of mos ago and feels better with it.  Happier.  More relaxed.  Only think that's helped in a long time. Handwriting is so much better, less shakey. Has RLS and can be bad when she lays down. Got a new kitten and enjoys it.  Now has 4 cats.  Afraid to try new meds after negative reaction to Vraylar.  Afraid of any med change.  Afraid of having SI and afraid she'd do it if had SI. Hopeless about getting better.  Saw pulmonologist over weakness and he says she's OK except deconditioned.  Can't tolerate wearing the masks.  Past psych med trials: Wellbutrin XL 300, Sertraline, fluoxetine SE brief  Vraylar SI, Latuda, Abilify , olanzapine  Symbyax side effects pramipexole, modafinil,  Seroquel which caused side effects of constipation,   lamotrigine,  Hx primidone sed refuses lithium because of altered taste.   She has a history of ataxia on 1500 mg of Depakote.   buspirone SE hallucinations ECT 2016 without help,   Review of Systems:  Review of Systems  Constitutional:  Positive for fatigue.  Respiratory:  Positive for shortness of breath.   Cardiovascular:  Negative for chest pain.  Gastrointestinal:  Positive for abdominal pain. Negative for vomiting.  Musculoskeletal:  Negative for gait problem.  Neurological:  Positive for dizziness and tremors. Negative for weakness.  Psychiatric/Behavioral:  Positive for decreased concentration, dysphoric mood and sleep disturbance. Negative for agitation, behavioral problems, confusion, hallucinations, self-injury and  suicidal ideas. The patient is nervous/anxious. The patient is not hyperactive.    Medications: I have reviewed the patient's current medications.  Current Outpatient Medications  Medication Sig Dispense Refill   ALPRAZolam (XANAX) 0.5 MG tablet TAKE 1 TABLET (0.5 MG TOTAL) BY MOUTH 2 (TWO) TIMES DAILY AS NEEDED FOR ANXIETY. TAKE 1 TABLET 30 MINUTES PRIOR TO PROCEDURE 30 tablet 0   aspirin EC 325 MG tablet Take 325 mg by mouth daily.     buPROPion (WELLBUTRIN SR) 200 MG 12 hr tablet Take 1 tablet (200 mg total) by mouth every morning. 90 tablet 0   dexlansoprazole (DEXILANT) 60 MG capsule TAKE 1 CAPSULE (60 MG TOTAL) BY MOUTH DAILY. 90 capsule 3   diltiazem (CARTIA XT) 180 MG 24 hr capsule Take 1 capsule (180 mg total) by mouth daily. 90 capsule 1   fexofenadine (ALLEGRA ALLERGY) 180 MG tablet Take 1 tablet (180 mg total) by mouth daily.     fluticasone (FLONASE) 50 MCG/ACT nasal spray Place 2 sprays into the nose daily as needed. For allergies     gabapentin (NEURONTIN) 100 MG capsule Take 1-3 capsules (100-300 mg total) by mouth at night as needed for restless leg (Patient taking differently: Take 300 mg by mouth at bedtime.) 90 capsule 2   ketoconazole (NIZORAL) 2 % cream Apply 1 application topically daily. 15 g 1   modafinil (PROVIGIL) 200 MG tablet Take 1.5 tablets (300 mg total) by mouth in the morning. 135 tablet 0   mupirocin ointment (BACTROBAN) 2 % Apply 1 application topically daily. 22 g 2   NONFORMULARY OR COMPOUNDED ITEM sm Azelaic acid/ metronidazole/ivermectin 15%/1%/1% cream apply to face bid 1 each 5   OLANZapine (ZYPREXA) 5 MG tablet Take 1  tablet (5 mg total) by mouth at bedtime. 30 tablet 2   Probiotic Product (PROBIOTIC DAILY PO) Take 1 tablet by mouth daily.      propranolol (INDERAL) 20 MG tablet TAKE 1 TABLET (20 MG TOTAL) BY MOUTH 2 (TWO) TIMES DAILY. 180 tablet 3   sertraline (ZOLOFT) 50 MG tablet Take 1.5 tablets (75 mg total) by mouth daily. (Patient taking  differently: Take 75 mg by mouth daily. 1 tab) 135 tablet 0   tretinoin (RETIN-A) 0.025 % cream Apply topically at bedtime. 45 g 4   trimethoprim (TRIMPEX) 100 MG tablet TAKE AFTER INTERCOURSE AS DIRECTED 30 tablet 2   No current facility-administered medications for this visit.    Medication Side Effects: None  Allergies:  Allergies  Allergen Reactions   Cariprazine Other (See Comments)    Patient becomes suicidal when taking this medication.  Patient becomes suicidal when taking this medication.    Metoprolol Other (See Comments)    Hallucinations hair falls out Hallucinations hair falls out   Nickel Rash   Iron Nausea And Vomiting    Past Medical History:  Diagnosis Date   Anxiety    Atrial fibrillation ([place])    a. Event monitor 2013 - PACs/bradycardia/SVT/short run of atrial fib by event monitor.    BIPOLAR DISORDER UNSPECIFIED    Bradycardia    Bursitis of hip 09/1999   Bilateral - Dr. Berenice Primas   DEPRESSION    Emphysema lung ([place]) 06/20/2016   pt states pulmonalongist stated started recently   GERD    Headache(784.0)    was with menstrual cycle. no longer a problem   HYPERLIPIDEMIA    Hypertension    Hypertension    IRRITABLE BOWEL SYNDROME, HX OF    Menopausal state 01/2012   [facility] = 88.5   Mitral valve prolapse 12/17/2000   a. dx 1980s, most recent echo did not demonstrate this.   MYALGIA    Normal coronary arteries    a. by cardiac CT 2013.   PAT (paroxysmal atrial tachycardia) (HCC)    PE (pulmonary embolism) 02/09/2015   a. Bilateral PEs 01/2015 when d-dimer 0.69, CP lying on left side. Followed by heme-onc - + lupus anticoagulant, mildly depressed protein S. Dr. Marin Olp is not certain if her hypercoagulable studies are significant for a thrombophilic state.   Premature atrial contractions    Pulmonary embolus ([facility]) 02/10/2015   Shortness of breath    Pulmonary eval 05/2002   Thyroid nodule 2014   Tremors of nervous system     Family History  Problem  Relation Age of Onset   Lung cancer Mother    Hypertension Mother    Hyperlipidemia Mother    Cancer Father        Esophageal cancer   Hyperlipidemia Father    Depression Father    Cancer Brother    Pulmonary embolism Brother    Colon cancer Paternal Uncle    Sarcoidosis Other    Testicular cancer Other     Social History   Socioeconomic History   Marital status: Married    Spouse name: Not on file   Number of children: 0   Years of education: Not on file   Highest education level: Not on file  Occupational History   Occupation: Nurse-Personal Care/HH    Employer: UNEMPLOYED  Tobacco Use   Smoking status: Former    Packs/day: 1.00    Years: 25.00    Pack years: 25.00    Types: Cigarettes  Quit date: 02/11/1996    Years since quitting: 24.9   Smokeless tobacco: Never   Tobacco comments:    Married, lives with spouse. 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   Please see review of systems for further details on the patient's review from today.   Objective:   Physical Exam:  LMP 05/11/2012   Physical Exam Neurological:     Mental Status: She is alert and oriented to  person, place, and time.     Cranial Nerves: No dysarthria.     Motor: Tremor present.  Psychiatric:        Attention and Perception: Attention and perception normal.        Mood and Affect: Mood is anxious and depressed.        Speech: Speech normal. Speech is not slurred.        Behavior: Behavior is cooperative.        Thought Content: Thought content normal. Thought content is not paranoid or delusional. Thought content does not include homicidal or suicidal ideation. Thought content does not include homicidal or suicidal plan.        Cognition and Memory: Cognition and memory normal.        Judgment: Judgment normal.     Comments: Insight intact Depression and anxiety are markedly better not gone back on sertraline with olanzapine 5 mg added    Lab Review:     Component Value Date/Time   NA 139 12/04/2020 1805   NA 145 (H) 04/05/2020 1508   NA 142 01/21/2017 1013   NA 143 01/16/2016 1005   K 4.1 12/04/2020 1805   K 3.2 (L) 01/21/2017 1013   K 3.3 (L) 01/16/2016 1005   CL 104 12/04/2020 1805   CL 106 01/21/2017 1013   CO2 25 12/04/2020 1805   CO2 29 01/21/2017 1013   CO2 20 (L) 01/16/2016 1005   GLUCOSE 128 (H) 12/04/2020 1805   GLUCOSE 95 01/21/2017 1013   BUN 11 12/04/2020 1805   BUN 14 04/05/2020 1508   BUN 9 01/21/2017 1013   BUN 12.6 01/16/2016 1005   CREATININE 1.07 12/04/2020 1805   CREATININE 0.94 12/20/2019 1449   CREATININE 0.9 01/21/2017 1013   CREATININE 0.9 01/16/2016 1005   CALCIUM 10.1 12/04/2020 1805   CALCIUM 9.2 01/21/2017 1013   CALCIUM 9.5 01/16/2016 1005   PROT 7.9 12/04/2020 1805   PROT 7.2 04/05/2020 1508   PROT 6.9 01/21/2017 1013   PROT 7.1 01/16/2016 1005   ALBUMIN 5.0 12/04/2020 1805   ALBUMIN 4.6 04/05/2020 1508  ALBUMIN 3.6 01/16/2016 1005   AST 28 12/04/2020 1805   AST 13 (L) 12/20/2019 1449   AST 16 01/16/2016 1005   ALT 28 12/04/2020 1805   ALT 15 12/20/2019 1449   ALT 22 01/21/2017 1013   ALT 24 01/16/2016 1005   ALKPHOS  88 12/04/2020 1805   ALKPHOS 62 01/21/2017 1013   ALKPHOS 81 01/16/2016 1005   BILITOT 0.3 12/04/2020 1805   BILITOT <0.2 04/05/2020 1508   BILITOT 0.3 12/20/2019 1449   BILITOT 0.59 01/16/2016 1005   GFRNONAA 76 04/05/2020 1508   GFRNONAA >60 12/20/2019 1449   GFRAA 87 04/05/2020 1508   GFRAA >60 12/03/2018 1346       Component Value Date/Time   WBC 9.7 12/04/2020 1805   RBC 5.18 (H) 12/04/2020 1805   HGB 13.9 12/04/2020 1805   HGB 13.7 04/05/2020 1508   HGB 12.8 01/21/2017 1013   HCT 43.6 12/04/2020 1805   HCT 41.4 04/05/2020 1508   HCT 38.2 01/21/2017 1013   PLT 395.0 12/04/2020 1805   PLT 289 04/05/2020 1508   MCV 84.3 12/04/2020 1805   MCV 85 04/05/2020 1508   MCV 90 01/21/2017 1013   MCH 28.0 04/05/2020 1508   MCH 28.9 12/20/2019 1449   MCHC 31.9 12/04/2020 1805   RDW 16.3 (H) 12/04/2020 1805   RDW 15.0 04/05/2020 1508   RDW 14.5 01/21/2017 1013   LYMPHSABS 3.4 12/04/2020 1805   LYMPHSABS 2.5 04/05/2020 1508   LYMPHSABS 3.6 (H) 01/21/2017 1013   MONOABS 0.6 12/04/2020 1805   EOSABS 0.2 12/04/2020 1805   EOSABS 0.1 04/05/2020 1508   EOSABS 0.5 01/21/2017 1013   BASOSABS 0.1 12/04/2020 1805   BASOSABS 0.1 04/05/2020 1508   BASOSABS 0.0 01/21/2017 1013    No results found for: POCLITH, LITHIUM   Lab Results  Component Value Date   PHENYTOIN <0.5 ug/mL (L) 11/05/2006   VALPROATE 73.7 11/25/2016     .res Assessment: Plan:    Anuradha was seen today for follow-up, depression, anxiety and sleeping problem.  Diagnoses and all orders for this visit:  Bipolar disorder with severe depression ([place])  Uncontrolled restless legs syndrome  Generalized anxiety disorder  Panic disorder with agoraphobia  Claustrophobia  Hoarding behavior  Poor sleep hygiene.  Improved with modafinil and motivation is better with it but wants to increase it.  TRD.  Lifelong history of chronic anhedonic depression and anxiety with very poor functioning continues.  Prognosis  is guarded bc multiple med failures and her resistance to change and chronicity of sx and low motivation for change.  She was getting some benefit from the prior medications a little bit more active and interested and motivated with the addition of modafinil without side effects.  She is highly dysfunctional and much worse off the sertraline for months..  She is not currently manic.  She is tolerating the medications.  She is had multiple medication failures as noted above.  She is fearful of trying new medications because of a history of suicidal thoughts on Vraylar.   Failed all FDA approved bipolar depression meds except Symbyax.  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  Her symptoms are not gone and she is still severely impaired in function especially socially but she is much happier and much less anxious.  She wonders about increasing the olanzapine if she could get further improvement.  However she has had restless legs  syndrome as a side effect which is not currently managed.  Successful trial olanzapine 5 mg with sertraline 50 for depression and anxiety but worse RLS Consider increasing the olanzapine later if RLS can be managed.  increase gabapentin for RLS 400-600 mg PM and disc SE.  Higher if needed..  Once controlled consider increase olanzapine.  Discussed potential metabolic side effects associated with atypical antipsychotics, as well as potential risk for movement side effects. Advised pt to contact office if movement side effects occur.  For tremor is considering increasing the propranolol. She'll check with cardiologist on this.  Push fluids bc so much Time in bed and past history of dehydration. Disc risk propranolol but it helps tremor.  Increase activity.  Work on sleep schedule and try to regulate it for overall mental health benefits.  Keep working on  improving activity.  Every little bit of progress can be additive over time.  Discussed the Cognitive behavioral approaches.  Doesn't drive much.  She is not motivated to do these things right now.  Modafinil would be likely safer than traditional stimulants which could cause psychotic sx.  Discussed side effects.  She has had none.   Continue other meds. Dont' increase further, Benefit 400 mg modafinil.  Okay to continue the dose reduced to 200 mg daily.  This appt was 30 mins.  FU 8-12 weeks  Lynder Parents, MD, DFAPA   Please see After Visit Summary for patient specific instructions.  Future Appointments  Date Time Provider [place]  02/07/2021  2:00 PM Burnell Blanks, MD [facility]  02/13/2021  3:00 PM Bauert, Nicolasa Ducking, LCSW [facility] None  03/12/2021  3:00 PM Bauert, Nicolasa Ducking, LCSW [facility] None  03/27/2021  3:45 PM [facility] None  03/27/2021  4:15 PM Moye, [place], MD [facility] None  01/16/2022  2:20 PM Moye, [place], MD [facility] None    No orders of the defined types were placed in this encounter.     -------------------------------

## 2021-02-04 ENCOUNTER — Other Ambulatory Visit: Payer: Self-pay | Admitting: Family Medicine

## 2021-02-04 ENCOUNTER — Other Ambulatory Visit: Payer: Self-pay | Admitting: Psychiatry

## 2021-02-04 DIAGNOSIS — I10 Essential (primary) hypertension: Secondary | ICD-10-CM

## 2021-02-04 DIAGNOSIS — G2581 Restless legs syndrome: Secondary | ICD-10-CM

## 2021-02-05 ENCOUNTER — Other Ambulatory Visit (HOSPITAL_COMMUNITY): Payer: Self-pay

## 2021-02-05 MED ORDER — DILTIAZEM HCL ER COATED BEADS 180 MG PO CP24
180.0000 mg | ORAL_CAPSULE | Freq: Every day | ORAL | 1 refills | Status: DC
  Filled 2021-02-05: qty 90, 90d supply, fill #0
  Filled 2021-05-02: qty 90, 90d supply, fill #1

## 2021-02-06 ENCOUNTER — Other Ambulatory Visit (HOSPITAL_COMMUNITY): Payer: Self-pay

## 2021-02-06 MED ORDER — GABAPENTIN 100 MG PO CAPS
100.0000 mg | ORAL_CAPSULE | Freq: Every evening | ORAL | 1 refills | Status: DC | PRN
  Filled 2021-02-06 – 2021-02-19 (×2): qty 90, 30d supply, fill #0

## 2021-02-06 NOTE — Progress Notes (Signed)
Chief Complaint  Patient presents with   Follow-up    SVT    History of Present Illness: 60 yo female with history of hyperlipidemia, HTN, Bipolar disorder, IBS, Mitral valve prolapse, bilateral PE (December 2016), SVT here today for cardiac follow up. She was seen as a new patient 10/08/11. She was diagnosed with mitral valve prolapse in the 1980s. At her first visit here in 2013 she c/o sharp chest pains. Coronary CTA 08/12/11 showed normal coronary arteries. Also noted skipped heart beats several times per week. Echo in 2013 showed normal LV size and function, mild MR. A 48 hour Holter monitor showed NSR with bradycardia, PACs and runs of SVT with possible short run of atrial fibrillation. She was seen in EP clinic by Dr. Rayann Heman October 2013 and he felt that this was most likely a long RP tachycardia. He did not recommend a change in therapy but suggested Flecainide if symptoms worsened and EP f/u only if there were changes. She was last seen in EP clinic in January 2014. She felt that her Toprol was causing hallucinations so she was started on Cardizem and Toprol stopped November 2014. She was found to have bilateral PE in December 2016 and was treated with Xarelto. She was seen in our office July 2017 by Melina Copa, PA-C following an ED visit for chest pain. Troponin negative. Reportedly had PVCs on the monitor in the ED. Chest CTA without evidence of recurrent PE. Nuclear stress test August 2017 with no evidence of ischemia. LVEF=69%. Echo December 2017 with PJKD=32-67%, grade 1 diastolic dysfunction. No valve disease.   She is here today for follow up. The patient denies any chest pain, dyspnea, palpitations, lower extremity edema, orthopnea, PND, dizziness, near syncope or syncope.    Primary Care Physician: Carollee Herter, Alferd Apa, DO   Past Medical History:  Diagnosis Date   Anxiety    Atrial fibrillation ([place])    a. Event monitor 2013 - PACs/bradycardia/SVT/short run of atrial fib by  event monitor.    BIPOLAR DISORDER UNSPECIFIED    Bradycardia    Bursitis of hip 09/1999   Bilateral - Dr. Berenice Primas   DEPRESSION    Emphysema lung ([place]) 06/20/2016   pt states pulmonalongist stated started recently   GERD    Headache(784.0)    was with menstrual cycle. no longer a problem   HYPERLIPIDEMIA    Hypertension    Hypertension    IRRITABLE BOWEL SYNDROME, HX OF    Menopausal state 01/2012   [facility] = 88.5   Mitral valve prolapse 12/17/2000   a. dx 1980s, most recent echo did not demonstrate this.   MYALGIA    Normal coronary arteries    a. by cardiac CT 2013.   PAT (paroxysmal atrial tachycardia) ([facility])    PE (pulmonary embolism) 02/09/2015   a. Bilateral PEs 01/2015 when d-dimer 0.69, CP lying on left side. Followed by heme-onc - + lupus anticoagulant, mildly depressed protein S. Dr. Marin Olp is not certain if her hypercoagulable studies are significant for a thrombophilic state.   Premature atrial contractions    Pulmonary embolus ([place]) 02/10/2015   Shortness of breath    Pulmonary eval 05/2002   Thyroid nodule 2014   Tremors of nervous system     Past Surgical History:  Procedure Laterality Date   COLONOSCOPY     LAPAROSCOPIC APPENDECTOMY N/A 03/17/2017   Procedure: APPENDECTOMY LAPAROSCOPIC;  Surgeon: Excell Seltzer, MD;  Location: [facility];  Service: General;  Laterality: N/A;  Lipoma (R) side  1990   Right back    Current Outpatient Medications  Medication Sig Dispense Refill   ALPRAZolam (XANAX) 0.5 MG tablet TAKE 1 TABLET (0.5 MG TOTAL) BY MOUTH 2 (TWO) TIMES DAILY AS NEEDED FOR ANXIETY. TAKE 1 TABLET 30 MINUTES PRIOR TO PROCEDURE 30 tablet 0   aspirin EC 325 MG tablet Take 325 mg by mouth daily.     buPROPion (WELLBUTRIN SR) 200 MG 12 hr tablet Take 1 tablet (200 mg total) by mouth every morning. 90 tablet 0   dexlansoprazole (DEXILANT) 60 MG capsule TAKE 1 CAPSULE (60 MG TOTAL) BY MOUTH DAILY. 90 capsule 3   diltiazem (CARTIA XT) 180 MG 24 hr capsule Take 1  capsule (180 mg total) by mouth daily. 90 capsule 1   fexofenadine (ALLEGRA ALLERGY) 180 MG tablet Take 1 tablet (180 mg total) by mouth daily.     fluticasone (FLONASE) 50 MCG/ACT nasal spray Place 2 sprays into the nose daily as needed. For allergies     gabapentin (NEURONTIN) 100 MG capsule Take 1-3 capsules (100-300 mg total) by mouth at night as needed for restless leg 90 capsule 1   ketoconazole (NIZORAL) 2 % cream Apply 1 application topically daily. 15 g 1   modafinil (PROVIGIL) 200 MG tablet Take 1.5 tablets (300 mg total) by mouth in the morning. 135 tablet 0   mupirocin ointment (BACTROBAN) 2 % Apply 1 application topically daily. 22 g 2   NONFORMULARY OR COMPOUNDED ITEM sm Azelaic acid/ metronidazole/ivermectin 15%/1%/1% cream apply to face bid 1 each 5   OLANZapine (ZYPREXA) 5 MG tablet Take 1 tablet (5 mg total) by mouth at bedtime. 30 tablet 2   Probiotic Product (PROBIOTIC DAILY PO) Take 1 tablet by mouth daily.      propranolol (INDERAL) 20 MG tablet TAKE 1 TABLET (20 MG TOTAL) BY MOUTH 2 (TWO) TIMES DAILY. 180 tablet 3   sertraline (ZOLOFT) 50 MG tablet Take 1.5 tablets (75 mg total) by mouth daily. (Patient taking differently: Take 75 mg by mouth daily. 1 tab) 135 tablet 0   tretinoin (RETIN-A) 0.025 % cream Apply topically at bedtime. 45 g 4   trimethoprim (TRIMPEX) 100 MG tablet TAKE AFTER INTERCOURSE AS DIRECTED 30 tablet 2   No current facility-administered medications for this visit.    Allergies  Allergen Reactions   Cariprazine Other (See Comments)    Patient becomes suicidal when taking this medication.  Patient becomes suicidal when taking this medication.    Metoprolol Other (See Comments)    Hallucinations hair falls out Hallucinations hair falls out   Nickel Rash   Iron Nausea And Vomiting    Social History   Socioeconomic History   Marital status: Married    Spouse name: Not on file   Number of children: 0   Years of education: Not on file    Highest education level: Not on file  Occupational History   Occupation: Nurse-Personal Care/HH    Employer: UNEMPLOYED  Tobacco Use   Smoking status: Former    Packs/day: 1.00    Years: 25.00    Pack years: 25.00    Types: Cigarettes    Quit date: 02/11/1996    Years since quitting: 25.0   Smokeless tobacco: Never   Tobacco comments:    Married, lives with spouse. 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      No exercise   Pt is on nutrisystem right now   Social Determinants of Health   Financial Resource Strain: Not on file  Food Insecurity: Not on file  Transportation Needs: Not on file  Physical Activity: Not on file  Stress: Not on file  Social Connections: Not on file  Intimate Partner Violence: Not on file    Family History  Problem Relation Age of Onset   Lung cancer Mother    Hypertension Mother    Hyperlipidemia Mother    Cancer Father        Esophageal cancer   Hyperlipidemia Father    Depression Father    Cancer Brother    Pulmonary embolism Brother    Colon cancer Paternal Uncle    Sarcoidosis Other    Testicular cancer Other     Review of Systems:  As stated in the HPI and otherwise negative.   BP 120/88 (BP Location: Left Arm, Patient Position: Sitting, Cuff Size: Normal)    Pulse 64    Ht 5\' 9"  (1.753 m)    Wt 174 lb 12.8 oz (79.3 kg)    LMP 05/11/2012    SpO2 98%    BMI 25.81 kg/m   Physical Examination: General: Well developed, well nourished, NAD  HEENT: OP clear, mucus membranes moist  SKIN: warm, dry. No rashes. Neuro: No focal deficits  Musculoskeletal: Muscle strength 5/5 all ext   Psychiatric: Mood and affect normal  Neck: No JVD, no carotid bruits, no thyromegaly, no lymphadenopathy.  Lungs:Clear bilaterally, no wheezes, rhonci, crackles Cardiovascular: Regular rate and rhythm. No murmurs, gallops or rubs. Abdomen:Soft. Bowel sounds present. Non-tender.  Extremities: No lower extremity edema. Pulses are 2 + in the bilateral DP/PT.  Echo December 2017: Left ventricle: The cavity size was normal. Wall thickness was   normal. Systolic function was normal. The estimated ejection   fraction was in the range of 50% to 55%. Wall motion was normal;   there were no regional wall motion abnormalities. Doppler   parameters are consistent with abnormal left ventricular   relaxation (grade 1 diastolic dysfunction).   Impressions:  - Normal LV systolic function; grade 1 diastolic dysfunction;   trace MR.  EKG:  EKG is  ordered today. The ekg ordered today demonstrates sinus  Recent Labs: 12/04/2020: ALT 28; BUN 11; Creatinine, Ser 1.07; Hemoglobin 13.9; Platelets 395.0; Potassium 4.1; Sodium 139; TSH 2.87   Lipid Panel    Component Value Date/Time   CHOL 321 (H) 04/14/2019 1522   TRIG 359.0 (H) 04/14/2019 1522   HDL 43.00 04/14/2019 1522   CHOLHDL 7 04/14/2019 1522   VLDL 71.8 (H) 04/14/2019 1522   LDLCALC 154 (H) 11/26/2017 1451   LDLDIRECT 227.0 04/14/2019 1522     Wt Readings from Last 3 Encounters:  02/07/21 174 lb 12.8 oz (79.3 kg)  12/04/20 177 lb (80.3 kg)  04/24/20 183 lb 9.6 oz (83.3 kg)     Other studies Reviewed: Additional studies/ records that were reviewed today include: . Review of the above records demonstrates:    Assessment and Plan:   1. SVT: She is known to have a long RP tachycardia. She  has rare palpitations. Will continue Cardizem. She also takes Inderal for her tremor.    2. Mitral regurgitation: Trivial by echo in December 2017.  Repeat echo in now.    3. HTN: BP is well controlled. No changes today  Current medicines are  reviewed at length with the patient today.  The patient does not have concerns regarding medicines.  The following changes have been made:  no change  Labs/ tests ordered today include:   Orders Placed This Encounter  Procedures   EKG 12-Lead   ECHOCARDIOGRAM COMPLETE   Disposition:   FU with me in 12 months  Signed, Lauree Chandler, MD 02/07/2021 3:26 PM    [facility] [address], [place], [place]  [address] Phone: [phone]; Fax: [phone]

## 2021-02-07 ENCOUNTER — Other Ambulatory Visit: Payer: Self-pay

## 2021-02-07 ENCOUNTER — Ambulatory Visit (INDEPENDENT_AMBULATORY_CARE_PROVIDER_SITE_OTHER): Payer: 59 | Admitting: Cardiovascular Disease

## 2021-02-07 ENCOUNTER — Encounter: Payer: Self-pay | Admitting: Cardiovascular Disease

## 2021-02-07 VITALS — BP 120/88 | HR 64 | Ht 69.0 in | Wt 174.8 lb

## 2021-02-07 DIAGNOSIS — I340 Nonrheumatic mitral (valve) insufficiency: Secondary | ICD-10-CM

## 2021-02-07 DIAGNOSIS — I471 Supraventricular tachycardia: Secondary | ICD-10-CM | POA: Diagnosis not present

## 2021-02-07 DIAGNOSIS — I10 Essential (primary) hypertension: Secondary | ICD-10-CM | POA: Diagnosis not present

## 2021-02-07 NOTE — Patient Instructions (Signed)
Medication Instructions:  Your physician recommends that you continue on your current medications as directed. Please refer to the Current Medication list given to you today.  *If you need a refill on your cardiac medications before your next appointment, please call your pharmacy*   Lab Work: None ordered   If you have labs (blood work) drawn today and your tests are completely normal, you will receive your results only by: [place] (if you have MyChart) OR A paper copy in the mail If you have any lab test that is abnormal or we need to change your treatment, we will call you to review the results.   Testing/Procedures: Your physician has requested that you have an echocardiogram. Echocardiography is a painless test that uses sound waves to create images of your heart. It provides your doctor with information about the size and shape of your heart and how well your hearts chambers and valves are working. This procedure takes approximately one hour. There are no restrictions for this procedure.    Follow-Up: At [facility], you and your health needs are our priority.  As part of our continuing mission to provide you with exceptional heart care, we have created designated Provider Care Teams.  These Care Teams include your primary Cardiologist (physician) and Advanced Practice Providers (APPs -  Physician Assistants and Nurse Practitioners) who all work together to provide you with the care you need, when you need it.  We recommend signing up for the patient portal called "MyChart".  Sign up information is provided on this After Visit Summary.  MyChart is used to connect with patients for Virtual Visits (Telemedicine).  Patients are able to view lab/test results, encounter notes, upcoming appointments, etc.  Non-urgent messages can be sent to your provider as well.   To learn more about what you can do with MyChart, go to [email or web address].    Your next appointment:   12  month(s)  The format for your next appointment:   In Person  Provider:   Lauree Chandler, MD     Other Instructions None

## 2021-02-13 ENCOUNTER — Ambulatory Visit (INDEPENDENT_AMBULATORY_CARE_PROVIDER_SITE_OTHER): Payer: 59 | Admitting: Psychology

## 2021-02-13 DIAGNOSIS — F331 Major depressive disorder, recurrent, moderate: Secondary | ICD-10-CM | POA: Diagnosis not present

## 2021-02-13 NOTE — Progress Notes (Signed)
[facility] Counselor/Therapist Progress Note  Patient ID: Beverly Cole, MRN: [id number],    Date: 02/13/2021  Time Spent: 3:00pm-4:00pm   60 minutes   Treatment Type: Individual Therapy  Reported Symptoms: worrying  Mental Status Exam: Appearance:  Casual     Behavior: Appropriate  Motor: Normal  Speech/Language:  Normal Rate  Affect: Appropriate  Mood: normal  Thought process: normal  Thought content:   WNL  Sensory/Perceptual disturbances:   WNL  Orientation: oriented to person, place, time/date, and situation  Attention: Good  Concentration: Good  Memory: WNL  Fund of knowledge:  Good  Insight:   Good  Judgment:  Good  Impulse Control: Good   Risk Assessment: Danger to Self:  No Self-injurious Behavior: No Danger to Others: No Duty to Warn:no Physical Aggression / Violence:No  Access to Firearms a concern: No  Gang Involvement:No   Subjective: Pt present for face-to-face therapy via Webex.  Pt consents to telehealth video session due to COVID-19 pandemic.     Location of pt: home.  Location of therapist: home office Pt talked about concerns about her oldest cat adjusting to the kitten.  Addressed pt's concerns and helped her problem solve.   Pt is very happy with having the kitten.  They are having to change vets bc there have been some issues with their current vet.  This is stressful for pt.  Worked on Child psychotherapist.   Pt states her mood continues to improve.  Pt states her thoughts have been more positive.  Pt would like to "get better".  Addressed what that would look like for pt.  She would like to drive herself places and be more active.  She has started to read recipes again and may get interested in cooking.  Pt has lost weight and is feeling better physically.  Pt is feeling motivated to set goals.  Worked with pt on goal setting and making the goals small enough to be doable and sustainable.   Pt is looking forward to her trip in February  to [place].   Provided supportive counseling.   Interventions: Cognitive Behavioral Therapy and Insight-Oriented  Diagnosis: F33.1  Plan: See pt's Treatment Plan for depression in Therapy Charts.  (Treatment Plan Target Date: 10/25/2021) Pt is progressing toward treatment goals.   Plan to continue to see pt every month.   Quinlee Sciarra, LCSW

## 2021-02-19 ENCOUNTER — Telehealth: Payer: Self-pay | Admitting: Psychiatry

## 2021-02-19 ENCOUNTER — Other Ambulatory Visit: Payer: Self-pay | Admitting: Psychiatry

## 2021-02-19 ENCOUNTER — Other Ambulatory Visit (HOSPITAL_COMMUNITY): Payer: Self-pay

## 2021-02-19 DIAGNOSIS — G2581 Restless legs syndrome: Secondary | ICD-10-CM

## 2021-02-19 MED ORDER — GABAPENTIN 300 MG PO CAPS
600.0000 mg | ORAL_CAPSULE | Freq: Every evening | ORAL | 1 refills | Status: DC | PRN
  Filled 2021-02-19 – 2021-03-03 (×2): qty 60, 30d supply, fill #0
  Filled 2021-03-13 – 2021-03-24 (×2): qty 60, 30d supply, fill #1

## 2021-02-19 NOTE — Telephone Encounter (Signed)
Pt called  and said that she is taking 600 mg of the gabapentin. She is out and the pharmacy will not refill because it is to soon. She needs a new script of the gabapentin 600 mg sent in to the pharmacy for restless leg sydrome. She also said that if he want s to send in a difffernt med for restless legs that is ok as well.Please give her a call at [phone]

## 2021-02-19 NOTE — Telephone Encounter (Signed)
Prescription sent for gabapentin 300 mg capsules 2 nightly to the [facility] pharmacy.  Please let her know

## 2021-02-20 ENCOUNTER — Other Ambulatory Visit (HOSPITAL_COMMUNITY): Payer: Self-pay

## 2021-02-20 NOTE — Telephone Encounter (Signed)
Left VM to RC

## 2021-02-20 NOTE — Telephone Encounter (Signed)
Patient called back while I was at lunch and I called her back on my return. She was notified of the Rx for gabapentin.

## 2021-02-21 ENCOUNTER — Other Ambulatory Visit (HOSPITAL_COMMUNITY): Payer: Self-pay

## 2021-02-21 ENCOUNTER — Encounter: Payer: Self-pay | Admitting: Family Medicine

## 2021-02-21 ENCOUNTER — Ambulatory Visit: Payer: 59 | Admitting: Family Medicine

## 2021-02-21 VITALS — BP 136/80 | HR 78 | Temp 98.0°F | Resp 18 | Ht 69.0 in | Wt 179.8 lb

## 2021-02-21 DIAGNOSIS — R350 Frequency of micturition: Secondary | ICD-10-CM

## 2021-02-21 DIAGNOSIS — M545 Low back pain, unspecified: Secondary | ICD-10-CM

## 2021-02-21 MED ORDER — NITROFURANTOIN MONOHYD MACRO 100 MG PO CAPS
100.0000 mg | ORAL_CAPSULE | Freq: Two times a day (BID) | ORAL | 0 refills | Status: DC
  Filled 2021-02-21: qty 14, 7d supply, fill #0

## 2021-02-21 MED ORDER — TRAMADOL HCL 50 MG PO TABS
50.0000 mg | ORAL_TABLET | Freq: Three times a day (TID) | ORAL | 0 refills | Status: AC | PRN
  Filled 2021-02-21: qty 10, 4d supply, fill #0

## 2021-02-21 NOTE — Progress Notes (Signed)
Subjective:   By signing my name below, I, Beverly Cole, attest that this documentation has been prepared under the direction and in the presence of Dr. Roma Schanz, DO. 02/21/2021    Patient ID: Beverly Cole, female    DOB: December 11, 1960, 61 y.o.   MRN: [id number]  Chief Complaint  Patient presents with   Urinary Frequency    X2 weeks, Pt states lower abdominal pain and urgency.     Urinary Frequency  Associated symptoms include frequency. Pertinent negatives include no vomiting.  Patient is in today for a office visit.  She complains of abdominal pain. She denies having any blood in the urine, discharge, and new back pain. She has a low grade fever. She has a history of kidney stones. The back pain she is experiencing is not similar to her previous kidney stones.  She complains of new back pain. She reports during her last vacation she started experiencing pain and since then her back is hurting. She is taking pain medication from her previous surgery to manage her pain. She is interested in taking medication to manage her pain at this time.  She is currently taking Macrobid after intercourse.   Past Medical History:  Diagnosis Date   Anxiety    Atrial fibrillation ([place])    a. Event monitor 2013 - PACs/bradycardia/SVT/short run of atrial fib by event monitor.    BIPOLAR DISORDER UNSPECIFIED    Bradycardia    Bursitis of hip 09/1999   Bilateral - Dr. Berenice Primas   DEPRESSION    Emphysema lung ([place]) 06/20/2016   pt states pulmonalongist stated started recently   GERD    Headache(784.0)    was with menstrual cycle. no longer a problem   HYPERLIPIDEMIA    Hypertension    Hypertension    IRRITABLE BOWEL SYNDROME, HX OF    Menopausal state 01/2012   [facility] = 88.5   Mitral valve prolapse 12/17/2000   a. dx 1980s, most recent echo did not demonstrate this.   MYALGIA    Normal coronary arteries    a. by cardiac CT 2013.   PAT (paroxysmal atrial tachycardia) (HCC)    PE  (pulmonary embolism) 02/09/2015   a. Bilateral PEs 01/2015 when d-dimer 0.69, CP lying on left side. Followed by heme-onc - + lupus anticoagulant, mildly depressed protein S. Dr. Marin Olp is not certain if her hypercoagulable studies are significant for a thrombophilic state.   Premature atrial contractions    Pulmonary embolus ([place]) 02/10/2015   Shortness of breath    Pulmonary eval 05/2002   Thyroid nodule 2014   Tremors of nervous system     Past Surgical History:  Procedure Laterality Date   COLONOSCOPY     LAPAROSCOPIC APPENDECTOMY N/A 03/17/2017   Procedure: APPENDECTOMY LAPAROSCOPIC;  Surgeon: Excell Seltzer, MD;  Location: [facility];  Service: General;  Laterality: N/A;   Lipoma (R) side  1990   Right back    Family History  Problem Relation Age of Onset   Lung cancer Mother    Hypertension Mother    Hyperlipidemia Mother    Cancer Father        Esophageal cancer   Hyperlipidemia Father    Depression Father    Cancer Brother    Pulmonary embolism Brother    Colon cancer Paternal Uncle    Sarcoidosis Other    Testicular cancer Other     Social History   Socioeconomic History   Marital status: Married  Spouse name: Not on file   Number of children: 0   Years of education: Not on file   Highest education level: Not on file  Occupational History   Occupation: Nurse-Personal Care/HH    Employer: UNEMPLOYED  Tobacco Use   Smoking status: Former    Packs/day: 1.00    Years: 25.00    Pack years: 25.00    Types: Cigarettes    Quit date: 02/11/1996    Years since quitting: 25.0   Smokeless tobacco: Never   Tobacco comments:    Married, lives with spouse. 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 TAKE 1 TABLET 30 MINUTES PRIOR TO PROCEDURE 30 tablet 0   aspirin EC 325 MG tablet Take 325 mg by mouth daily.     buPROPion (WELLBUTRIN SR) 200 MG 12 hr tablet Take 1 tablet (200 mg total) by mouth every morning. 90 tablet 0   dexlansoprazole (DEXILANT) 60 MG capsule TAKE 1 CAPSULE (60 MG TOTAL) BY MOUTH DAILY. 90 capsule 3   diltiazem (CARTIA XT) 180 MG 24 hr capsule Take 1 capsule (180 mg total) by mouth daily. 90 capsule 1   fexofenadine (ALLEGRA ALLERGY) 180 MG tablet Take 1 tablet (180 mg total) by mouth daily.     fluticasone (FLONASE) 50 MCG/ACT nasal spray Place 2 sprays into the nose daily as needed. For allergies     gabapentin (NEURONTIN) 300 MG capsule Take 2 capsules (600 mg total) by mouth at bedtime as needed. 60 capsule 1   ketoconazole (NIZORAL) 2 % cream Apply 1 application topically daily. 15 g 1   modafinil (PROVIGIL) 200 MG tablet Take 1.5 tablets (300 mg total) by mouth in the morning. 135 tablet 0   mupirocin ointment (BACTROBAN) 2 % Apply 1 application topically daily. 22 g 2   NONFORMULARY OR COMPOUNDED ITEM sm Azelaic acid/ metronidazole/ivermectin 15%/1%/1% cream apply to face bid 1 each 5   OLANZapine (ZYPREXA) 5 MG tablet Take 1 tablet (5 mg total) by mouth at bedtime. 30 tablet 2   Probiotic Product (PROBIOTIC DAILY PO) Take 1 tablet by mouth daily.       propranolol (INDERAL) 20 MG tablet TAKE 1 TABLET (20 MG TOTAL) BY MOUTH 2 (TWO) TIMES DAILY. 180 tablet 3   sertraline (ZOLOFT) 50 MG tablet Take 1.5 tablets (75 mg total) by mouth daily. (Patient taking differently: Take 75 mg by mouth daily. 1 tab) 135 tablet 0   tretinoin (RETIN-A) 0.025 %  cream Apply topically at bedtime. 45 g 4   trimethoprim (TRIMPEX) 100 MG tablet TAKE AFTER INTERCOURSE AS DIRECTED 30 tablet 2   No facility-administered medications prior to visit.    Allergies  Allergen Reactions   Cariprazine Other (See Comments)    Patient becomes suicidal when taking this medication.  Patient becomes suicidal when taking this medication.    Metoprolol Other (See Comments)    Hallucinations hair falls out Hallucinations hair falls out   Nickel Rash   Iron Nausea And Vomiting    Review of Systems  Constitutional:  Negative for fever and malaise/fatigue.  HENT:  Negative for congestion.   Eyes:  Negative for blurred vision.  Respiratory:  Negative for cough and shortness of breath.   Cardiovascular:  Negative for chest pain, palpitations and leg swelling.  Gastrointestinal:  Positive for abdominal pain. Negative for vomiting.  Genitourinary:  Positive for frequency.  Musculoskeletal:  Positive for back pain.  Skin:  Negative for rash.  Neurological:  Negative for loss of consciousness and headaches.      Objective:    Physical Exam Vitals and nursing note reviewed.  Constitutional:      General: She is not in acute distress.    Appearance: Normal appearance. She is well-developed. She is not ill-appearing.  HENT:     Head: Normocephalic and atraumatic.     Right Ear: External ear normal.     Left Ear: External ear normal.  Eyes:     Extraocular Movements: Extraocular movements intact.     Conjunctiva/sclera: Conjunctivae normal.     Pupils: Pupils are equal, round, and reactive to light.  Neck:     Thyroid: No thyromegaly.     Vascular: No carotid bruit or  JVD.  Cardiovascular:     Rate and Rhythm: Normal rate and regular rhythm.     Heart sounds: Normal heart sounds. No murmur heard.   No gallop.  Pulmonary:     Effort: Pulmonary effort is normal. No respiratory distress.     Breath sounds: Normal breath sounds. No wheezing or rales.  Chest:     Chest wall: No tenderness.  Abdominal:     Palpations: Abdomen is soft.     Tenderness: There is no abdominal tenderness.  Musculoskeletal:     Cervical back: Normal range of motion and neck supple.  Skin:    General: Skin is warm and dry.  Neurological:     Mental Status: She is alert and oriented to person, place, and time.  Psychiatric:        Behavior: Behavior normal.        Judgment: Judgment normal.    BP 136/80 (BP Location: Right Arm, Patient Position: Sitting, Cuff Size: Normal)    Pulse 78    Temp 98 F (36.7 C) (Oral)    Resp 18    Ht 5' 9" (1.753 m)    Wt 179 lb 12.8 oz (81.6 kg)    LMP 05/11/2012    SpO2 94%    BMI 26.55 kg/m  Wt Readings from Last 3 Encounters:  02/21/21 179 lb 12.8 oz (81.6 kg)  02/07/21 174 lb 12.8 oz (79.3 kg)  12/04/20 177 lb (80.3 kg)    Diabetic Foot Exam - Simple   No data filed    Lab Results  Component Value Date   WBC 9.7 12/04/2020   HGB 13.9 12/04/2020   HCT 43.6 12/04/2020   PLT 395.0 12/04/2020   GLUCOSE 128 (H) 12/04/2020  CHOL 321 (H) 04/14/2019   TRIG 359.0 (H) 04/14/2019   HDL 43.00 04/14/2019   LDLDIRECT 227.0 04/14/2019   LDLCALC 154 (H) 11/26/2017   ALT 28 12/04/2020   AST 28 12/04/2020   NA 139 12/04/2020   K 4.1 12/04/2020   CL 104 12/04/2020   CREATININE 1.07 12/04/2020   BUN 11 12/04/2020   CO2 25 12/04/2020   TSH 2.87 12/04/2020   INR 1.10 02/09/2015   HGBA1C 5.3 03/20/2009   MICROALBUR 0.6 05/19/2011    Lab Results  Component Value Date   TSH 2.87 12/04/2020   Lab Results  Component Value Date   WBC 9.7 12/04/2020   HGB 13.9 12/04/2020   HCT 43.6 12/04/2020   MCV 84.3 12/04/2020   PLT 395.0  12/04/2020   Lab Results  Component Value Date   NA 139 12/04/2020   K 4.1 12/04/2020   CHLORIDE 110 (H) 01/16/2016   CO2 25 12/04/2020   GLUCOSE 128 (H) 12/04/2020   BUN 11 12/04/2020   CREATININE 1.07 12/04/2020   BILITOT 0.3 12/04/2020   ALKPHOS 88 12/04/2020   AST 28 12/04/2020   ALT 28 12/04/2020   PROT 7.9 12/04/2020   ALBUMIN 5.0 12/04/2020   CALCIUM 10.1 12/04/2020   ANIONGAP 8 12/20/2019   EGFR 75 (L) 01/16/2016   GFR 56.25 (L) 12/04/2020   Lab Results  Component Value Date   CHOL 321 (H) 04/14/2019   Lab Results  Component Value Date   HDL 43.00 04/14/2019   Lab Results  Component Value Date   LDLCALC 154 (H) 11/26/2017   Lab Results  Component Value Date   TRIG 359.0 (H) 04/14/2019   Lab Results  Component Value Date   CHOLHDL 7 04/14/2019   Lab Results  Component Value Date   HGBA1C 5.3 03/20/2009       Assessment & Plan:   Problem List Items Addressed This Visit   None Visit Diagnoses     Urinary frequency    -  Primary   Relevant Medications   nitrofurantoin, macrocrystal-monohydrate, (MACROBID) 100 MG capsule   Other Relevant Orders   POCT Urinalysis Dipstick (Automated)   Urine Culture (Completed)   Acute right-sided low back pain without sciatica       Relevant Medications   traMADol (ULTRAM) 50 MG tablet        Meds ordered this encounter  Medications   traMADol (ULTRAM) 50 MG tablet    Sig: Take 1 tablet (50 mg total) by mouth every 8 (eight) hours as needed for up to 5 days.    Dispense:  10 tablet    Refill:  0   nitrofurantoin, macrocrystal-monohydrate, (MACROBID) 100 MG capsule    Sig: Take 1 capsule (100 mg total) by mouth 2 (two) times daily.    Dispense:  14 capsule    Refill:  0    I, Dr. Roma Schanz, DO, personally preformed the services described in this documentation.  All medical record entries made by the scribe were at my direction and in my presence.  I have reviewed the chart and discharge  instructions (if applicable) and agree that the record reflects my personal performance and is accurate and complete. 02/21/2021   I,Beverly Cole,acting as a scribe for Ann Held, DO.,have documented all relevant documentation on the behalf of Marieme Mcmackin, DO,as directed by  Ann Held, DO while in the presence of Ann Held, DO.   Rosalita Chessman  Chase, DO

## 2021-02-21 NOTE — Patient Instructions (Signed)
Dysuria ?Dysuria is pain or discomfort during urination. The pain or discomfort may be felt in the part of the body that drains urine from the bladder (urethra) or in the surrounding tissue of the genitals. The pain may also be felt in the groin area, lower abdomen, or lower back. ?You may have to urinate frequently or have the sudden feeling that you have to urinate (urgency). Dysuria can affect anyone, but it is more common in females. Dysuria can be caused by many different things, including: ?Urinary tract infection. ?Kidney stones or bladder stones. ?Certain STIs (sexually transmitted infections), such as chlamydia. ?Dehydration. ?Inflammation of the tissues of the vagina. ?Use of certain medicines. ?Use of certain soaps or scented products that cause irritation. ?Follow these instructions at home: ?Medicines ?Take over-the-counter and prescription medicines only as told by your health care provider. ?If you were prescribed an antibiotic medicine, take it as told by your health care provider. Do not stop taking the antibiotic even if you start to feel better. ?Eating and drinking ? ?Drink enough fluid to keep your urine pale yellow. ?Avoid caffeinated beverages, tea, and alcohol. These beverages can irritate the bladder and make dysuria worse. In males, alcohol may irritate the prostate. ?General instructions ?Watch your condition for any changes. ?Urinate often. Avoid holding urine for long periods of time. ?If you are female, you should wipe from front to back after urinating or having a bowel movement. Use each piece of toilet paper only once. ?Empty your bladder after sex. ?Keep all follow-up visits. This is important. ?If you had any tests done to find the cause of dysuria, it is up to you to get your test results. Ask your health care provider, or the department that is doing the test, when your results will be ready. ?Contact a health care provider if: ?You have a fever. ?You develop pain in your back or  sides. ?You have nausea or vomiting. ?You have blood in your urine. ?You are not urinating as often as you usually do. ?Get help right away if: ?Your pain is severe and not relieved with medicines. ?You cannot eat or drink without vomiting. ?You are confused. ?You have a rapid heartbeat while resting. ?You have shaking or chills. ?You feel extremely weak. ?Summary ?Dysuria is pain or discomfort while urinating. Many different conditions can lead to dysuria. ?If you have dysuria, you may have to urinate frequently or have the sudden feeling that you have to urinate (urgency). ?Watch your condition for any changes. Keep all follow-up visits. ?Make sure that you urinate often and drink enough fluid to keep your urine pale yellow. ?This information is not intended to replace advice given to you by your health care provider. Make sure you discuss any questions you have with your health care provider. ?Document Revised: 09/09/2019 Document Reviewed: 09/09/2019 ?Elsevier Patient Education ? 2022 Elsevier Inc. ? ?

## 2021-02-22 LAB — URINE CULTURE
MICRO NUMBER:: 12863107
SPECIMEN QUALITY:: ADEQUATE

## 2021-02-26 ENCOUNTER — Other Ambulatory Visit: Payer: Self-pay

## 2021-02-26 ENCOUNTER — Ambulatory Visit (HOSPITAL_COMMUNITY): Payer: 59 | Attending: Cardiovascular Disease

## 2021-02-26 DIAGNOSIS — I340 Nonrheumatic mitral (valve) insufficiency: Secondary | ICD-10-CM | POA: Diagnosis not present

## 2021-02-26 LAB — ECHOCARDIOGRAM COMPLETE
Area-P 1/2: 3.17 cm2
MV M vel: 5.95 m/s
MV Peak grad: 141.6 mmHg
Radius: 0.4 cm
S' Lateral: 2.8 cm

## 2021-03-01 ENCOUNTER — Telehealth: Payer: Self-pay | Admitting: Cardiovascular Disease

## 2021-03-01 NOTE — Telephone Encounter (Signed)
Patient is returning call to discuss echo results. °

## 2021-03-01 NOTE — Telephone Encounter (Signed)
Spoke with pt and advised per Dr Angelena Form echo shows:  Her heart is strong. Mild mitral valve regurgitation. Cdm  Pt verbalizes understanding and thanked Therapist, sports for the call.

## 2021-03-04 ENCOUNTER — Other Ambulatory Visit (HOSPITAL_COMMUNITY): Payer: Self-pay

## 2021-03-06 MED FILL — Dexlansoprazole Cap Delayed Release 60 MG: ORAL | 60 days supply | Qty: 60 | Fill #3 | Status: CN

## 2021-03-07 ENCOUNTER — Other Ambulatory Visit (HOSPITAL_COMMUNITY): Payer: Self-pay

## 2021-03-07 MED FILL — Dexlansoprazole Cap Delayed Release 60 MG: ORAL | 60 days supply | Qty: 60 | Fill #3 | Status: AC

## 2021-03-12 ENCOUNTER — Ambulatory Visit (INDEPENDENT_AMBULATORY_CARE_PROVIDER_SITE_OTHER): Payer: 59 | Admitting: Psychology

## 2021-03-12 DIAGNOSIS — F331 Major depressive disorder, recurrent, moderate: Secondary | ICD-10-CM

## 2021-03-12 NOTE — Progress Notes (Signed)
[place] Counselor/Therapist Progress Note  Patient ID: Beverly Cole, MRN: [id number],    Date: 03/12/2021  Time Spent: 3:00pm-4:00pm   60 minutes   Treatment Type: Individual Therapy  Reported Symptoms: worrying, anxiety  Mental Status Exam: Appearance:  Casual     Behavior: Appropriate  Motor: Normal  Speech/Language:  Normal Rate  Affect: Appropriate  Mood: normal  Thought process: normal  Thought content:   WNL  Sensory/Perceptual disturbances:   WNL  Orientation: oriented to person, place, time/date, and situation  Attention: Good  Concentration: Good  Memory: WNL  Fund of knowledge:  Good  Insight:   Good  Judgment:  Good  Impulse Control: Good   Risk Assessment: Danger to Self:  No Self-injurious Behavior: No Danger to Others: No Duty to Warn:no Physical Aggression / Violence:No  Access to Firearms a concern: No  Gang Involvement:No   Subjective: Pt present for face-to-face therapy via Webex.  Pt consents to telehealth video session due to COVID-19 pandemic.     Location of pt: home.  Location of therapist: home office Pt talked about going on her trip next week.  She and her husband Beverly Cole are going on vacation to [place].   Pt is very excited but has some anxiety about the travel.  Addressed pt's anxiety and worked on [employer]. Pt has been going out more.  She had gone to stores and to a friend's house.  She got her hair colored.  Pt has driven herself places.  Acknowledged the progress pt has made being more active and engaging in self care.   Pt and husband are working on house projects.  They plan to put in hardwood floors downstairs.   Pt states her mood continues to improve.  Pt states her thoughts have been more positive.  Pt is feeling motivated to set goals.  Worked with pt on goal setting and making the goals small enough to be doable and sustainable.     Provided supportive counseling.   Interventions: Cognitive Behavioral  Therapy and Insight-Oriented  Diagnosis: F33.1  Plan: See pt's Treatment Plan for depression in Therapy Charts.  (Treatment Plan Target Date: 10/25/2021) Pt is progressing toward treatment goals.   Plan to continue to see pt every month.   Odessa Morren, LCSW

## 2021-03-13 ENCOUNTER — Other Ambulatory Visit (HOSPITAL_COMMUNITY): Payer: Self-pay

## 2021-03-14 ENCOUNTER — Other Ambulatory Visit (HOSPITAL_COMMUNITY): Payer: Self-pay

## 2021-03-24 ENCOUNTER — Other Ambulatory Visit: Payer: Self-pay | Admitting: Psychiatry

## 2021-03-24 DIAGNOSIS — F314 Bipolar disorder, current episode depressed, severe, without psychotic features: Secondary | ICD-10-CM

## 2021-03-24 DIAGNOSIS — F4001 Agoraphobia with panic disorder: Secondary | ICD-10-CM

## 2021-03-24 DIAGNOSIS — F411 Generalized anxiety disorder: Secondary | ICD-10-CM

## 2021-03-25 ENCOUNTER — Other Ambulatory Visit (HOSPITAL_COMMUNITY): Payer: Self-pay

## 2021-03-25 MED ORDER — MODAFINIL 200 MG PO TABS
300.0000 mg | ORAL_TABLET | Freq: Every morning | ORAL | 0 refills | Status: DC
  Filled 2021-03-25 – 2021-04-01 (×2): qty 135, 90d supply, fill #0

## 2021-03-25 MED ORDER — BUPROPION HCL ER (SR) 200 MG PO TB12
200.0000 mg | ORAL_TABLET | Freq: Every morning | ORAL | 0 refills | Status: DC
  Filled 2021-03-25: qty 90, 90d supply, fill #0

## 2021-03-25 MED ORDER — SERTRALINE HCL 50 MG PO TABS
75.0000 mg | ORAL_TABLET | Freq: Every day | ORAL | 0 refills | Status: DC
  Filled 2021-03-25: qty 135, 90d supply, fill #0

## 2021-03-25 NOTE — Telephone Encounter (Signed)
Modafinil filled 01/01/21 appt on 2/16

## 2021-03-26 ENCOUNTER — Other Ambulatory Visit (HOSPITAL_COMMUNITY): Payer: Self-pay

## 2021-03-27 ENCOUNTER — Ambulatory Visit: Payer: 59

## 2021-03-27 ENCOUNTER — Other Ambulatory Visit: Payer: Self-pay

## 2021-03-27 ENCOUNTER — Ambulatory Visit (INDEPENDENT_AMBULATORY_CARE_PROVIDER_SITE_OTHER): Payer: 59 | Admitting: Dermatology

## 2021-03-27 DIAGNOSIS — L089 Local infection of the skin and subcutaneous tissue, unspecified: Secondary | ICD-10-CM | POA: Diagnosis not present

## 2021-03-27 DIAGNOSIS — L738 Other specified follicular disorders: Secondary | ICD-10-CM

## 2021-03-27 NOTE — Progress Notes (Signed)
° °  Follow-Up Visit   Subjective  Beverly Cole is a 61 y.o. female who presents for the following: Follow-up (Patient here today to follow up and treat sebaceous hyperplasia at the face. Patient has done well with previous treatments. ).   The following portions of the chart were reviewed this encounter and updated as appropriate:   Tobacco   Allergies   Meds   Problems   Med Hx   Surg Hx   Fam Hx       Review of Systems:  No other skin or systemic complaints except as noted in HPI or Assessment and Plan.  Objective  Well appearing patient in no apparent distress; mood and affect are within normal limits.  A focused examination was performed including face. Relevant physical exam findings are noted in the Assessment and Plan.  face > 40 Small yellow papules with a central dell. Pustule at left cheek      Assessment & Plan  Sebaceous hyperplasia face > 40  Will culture pustule at left cheek, patient to use mupirocin for wound care after [facility] treatment  Destruction of lesion - face > 40  Destruction method: electrodesiccation and curettage   Destruction method comment:  Light electrodesiccation only Informed consent: discussed and consent obtained   Timeout:  patient name, date of birth, surgical site, and procedure verified Patient was prepped and draped in usual sterile fashion: area prepped with isopropyl alcohol. Anesthesia: the lesion was anesthetized in a standard fashion   Anesthetic:  Topical anesthetic Hemostasis achieved with:  electrodesiccation Outcome: patient tolerated procedure well with no complications   Post-procedure details: wound care instructions given   Additional details:  Mupirocin and a pressure dressing applied  Related Medications mupirocin ointment (BACTROBAN) 2 % Apply 1 application topically daily.  Pustule  Related Procedures Anaerobic and Aerobic Culture   Return for TBSE, as scheduled.  Graciella Belton, RMA, am acting as scribe  for Forest Gleason, MD .  Documentation: I have reviewed the above documentation for accuracy and completeness, and I agree with the above.  Forest Gleason, MD

## 2021-03-27 NOTE — Patient Instructions (Signed)

## 2021-03-28 ENCOUNTER — Encounter: Payer: Self-pay | Admitting: Psychiatry

## 2021-03-28 ENCOUNTER — Other Ambulatory Visit (HOSPITAL_COMMUNITY): Payer: Self-pay

## 2021-03-28 ENCOUNTER — Telehealth (INDEPENDENT_AMBULATORY_CARE_PROVIDER_SITE_OTHER): Payer: 59 | Admitting: Psychiatry

## 2021-03-28 DIAGNOSIS — G2581 Restless legs syndrome: Secondary | ICD-10-CM | POA: Diagnosis not present

## 2021-03-28 DIAGNOSIS — F314 Bipolar disorder, current episode depressed, severe, without psychotic features: Secondary | ICD-10-CM

## 2021-03-28 MED ORDER — GABAPENTIN 300 MG PO CAPS
600.0000 mg | ORAL_CAPSULE | Freq: Every day | ORAL | 0 refills | Status: DC
  Filled 2021-03-28 – 2021-04-04 (×2): qty 270, 90d supply, fill #0

## 2021-03-28 MED ORDER — OLANZAPINE 5 MG PO TABS
5.0000 mg | ORAL_TABLET | Freq: Every day | ORAL | 0 refills | Status: DC
  Filled 2021-03-28 – 2021-04-03 (×3): qty 90, 90d supply, fill #0

## 2021-03-28 NOTE — Progress Notes (Signed)
LEGEND PECORE [id number] Oct 01, 1960 61 y.o.  Video Visit via My Chart  I connected with pt by My Chart and verified that I am speaking with the correct person using two identifiers.   I discussed the limitations, risks, security and privacy concerns of performing an evaluation and management service by My Chart  and the availability of in person appointments. I also discussed with the patient that there may be a patient responsible charge related to this service. The patient expressed understanding and agreed to proceed.  I discussed the assessment and treatment plan with the patient. The patient was provided an opportunity to ask questions and all were answered. The patient agreed with the plan and demonstrated an understanding of the instructions.   The patient was advised to call back or seek an in-person evaluation if the symptoms worsen or if the condition fails to improve as anticipated.  I provided 30 minutes of video time during this encounter.  The patient was located at home and the provider was located office. Session started at 400 until 430  Subjective:   Patient ID:  Beverly Cole is a 61 y.o. (DOB [birth year]) female.  Chief Complaint:  No chief complaint on file.   Depression        Associated symptoms include decreased concentration and fatigue.  Associated symptoms include no suicidal ideas.  Past medical history includes anxiety.   Anxiety Symptoms include decreased concentration, dizziness, nervous/anxious behavior and shortness of breath. Patient reports no chest pain, confusion or suicidal ideas.    Beverly Cole  today for follow-up of chronic depression and anxiety.  Lately depression worse than anxiety.  Seen with husband today  When seen May 11, 2018.  For persistent anxiety we elected to retry buspirone and try increasing it to 30 mg twice daily if tolerated.   Couldn't tolerate it this time DT muscle spasms.  Pharmacy called saying she could have  serotonin syndrome.  When seen August 09, 2018 and she refused med changes despite chronic anxiety and depression. She remained on sertraline 50, Depakote ER 1000 mg, Wellbutrin SR 200 mg AM  Panic wearing cloth masks.  Using a shield.  Not in the office today bc H exposed to Covid.    seen December 09, 2018.  Because of chronic depression and dysfunction with fatigue and poor motivation we decided off label trial  trial of modafinil 200 mg 1/2 each am for 1 week then 200 mg each AM. Did see benefit from it.  More energy and working to stay out of bed more.    seen January 10, 2019.  The modafinil had been helpful.  No meds were changed.  seen April 07, 2019.  The following was noted: Still sees benefit from modafinil but still not caring for herself like she should.  Not wanting to go anywhere.  Not driven since early fall.  Hard to breathe with the mask and it creates anxiety.  Hard to be in enclosed spaces like cars and planes for extended periods.   Trying to set her alarm clock.  No hallucinations.  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  Plans to send more too.  Still follow through is not great.  Still depression and productivity is still poor but it is not as poor as it was before modafinil. Plan increase modafinil to 300 mg daily to see if function can be improved.  06/29/2019  appointment, the following is noted: Rare Xanax.  Did increase modafinil to 300 mg daily. Saw benefit with the increase with less time in bed but still markedly functionally impaired.   It seems like I get used to it and then doesn't maintain her get up and go.  Still having a hard time with self care like hygiene. Chronic low motivation and lack of interest is unchanged.   Gall bladder problems.  It's better at the moment.    Not more sad, just like I've always been.  Can't make herself leave the house DT depression and anxiety.  She won't do  chores.   Less excess sleeping.  He works from home 2 days weekly.  No periods of hyperactivity or manic sx since the spring. Attending therapy every 2 weeks.   Can be confused when first wakes up regardless of time of day. Had episode of hallucination in the middle of the night when awakened. Scarecrow frightened her. This is rare event.  No hallucinations during the day.  Melatonin makes it worse. Has had fearful thoughts of planes crashing or bad things happening to others and it might be her fault.  Fight with brother lately and not speaking with him now. Plan:  No med changes  10/11/19 appt with the following noted: Unsteady and tremors gotten worse.  Not gone to doctor. Propranolol not helping as much.   On Depakote ER 1000mg  HS which is a reduction. So tired all the time and H says she's not doing anything.  H says accomplishing littte and still lays in bed a lot.  H says it's 2-3 PM before she gets OOB.  Memory is poor.  Everything is such a chore and doesn't want to do things. H says she starts things she doesn't finish.  Lots of new projects.   Increase modafinil on her own to 400 mg daily in the AM.  Anxious.  Rare Xanax.  Was happy when got new cats in the summer.  Anxious and Depressed and describes anxiety as Moderate. Anxiety symptoms include: Excessive Worry,.  She thinks sertraline reduces her obsessiveness.  Past hx panic so avoidant. Never get to relax.  Pt reports sleeps excessively even with modafinil. Pt reports that appetite is good. Pt reports that energy is poor and loss of interest or pleasure in usual activities, poor motivation and withdrawn from usual activities. Just don't care about things and admits to a lot of general anxiety and everything takes a lot of energy out of her.  Compulsively tapes and watches TV shows, news and awards shows.  Can't delete it bc I'll miss something.  Watches TV in bed. Concentration is poor. Suicidal thoughts:  denied by patient. But chronic  death thoughts.  Poor productivity overall.  Chronic poor self care, showers only weekly.  Just don't care.  Chronic disability.  She thinks sertraline helped the anxiety some.  Likes that H is at home.  Helps her mood.  Doesn't care about going out. Feels faint when she tries to wear a mask.  Too anxious driving and is avoidant. Collects TV shows, she can't erase unless she watches entirely. Hoards some bc afraid she might need it some day.   Rare use of Xanax bc fears addiction. Plan: Attempted referral to neurology at [facility] neurology.  They refused to see patient stating they had nothing to offer her. It was suggested to patient that she try to get in with Dr. Jannifer Franklin whom she had seen in the past.  01/10/2020 appointment with the following noted: Reduced modafinil to 300 mg bc didn't want to get hooked on anything about 2-3 weeks.  Did initially see benefit from the modafinil but seemed to get tolerant to it. Feels better not talking to brother bc differing views of politics.  Feels he's a negative person but never depressed. Overall thinks she's doing pretty well relatively.  However is chronically depressed and never happy.  Nothing changed with the meds. Depression worse than anxiety. H didn't think modafinil made much difference.  H CO her inactivity.. She felt it was helpful for energy initially. Had to cancel trips bc can't wear a mask for that long.  Makes her sad. Driven twice in a year or so DT anxiety. Wants prn Xanax. Plan: DC sertraline Start fluoxetine 20 mg daily with olanzapine 5 mg daily.  05/22/2020 appointment with following noted: Didn't remember to stop sertraline and took fluoxetine and olanzapine for 2 days and couldn't eat and stopped it. Then went to dermatologist December who told her couldn't take Zoloft with fungus med and stopped sertraline. Then became more depressed.  I have to go back on the Zoloft. Remained on modafinil, Wellbutrin, Depakote.  Even less  motivated to do anything like cook, clean.  Won't go to [employer] etc. Chronic depression with hopelessness to get better Plan: No med changes  07/31/2020 appointment with the following noted: Back on sertraline at 75 mg daily with Remained on modafinil, Wellbutrin, Depakote.  Aunt died and one of worst fears but not thinking about it. Family got Covid but she recovered.  She thought it might kill and didn't care. Tremors are getting worse and balance problems. Went to concert and that was a problem.  Enjoyed the concert. Also saw Kathrene Bongo.  Music always important to her.  1980 had planned to commit suicide listening to an album over and over but didn't attempt. B and M have familial tremor also. Still anxious and depressed and haven't driven in a year DT anxiety. Plan: Option of trial olanzapine alone without fluoxetine.  Rec retry.  She agrees.  10/23/2020 appointment with the following noted: "Olanzapine is really working."  Initial SE drunk at 5 mg HS so cut it in half.  Got used to it but I feel happy and more relaxed and easier time going to sleep.  If I could still chose to die I would but clearly happier but not more hopeful.  Laughing more.  No SI. Needs to care about things more.  Chronic lack of motivations. Still doesn't want to leave the house but did enjoy a concert in [place]. Chronic unsteadiness is worse.  Last disc with doctor in March who rec PT but she never did the PT.  Misses aunt who died.   She reduced the Depakote to 500 mg HS. When stopped sertraline was not doing well and restarted it bc stressed and unhappy.  Seems to be a key med for me. Pending card test soon.  Feels better than in the past. RLS is worse after starting olanzapine and even affecting arms but had it before.  Happens after lays down at night.  Takes MG which helps.   Xanax about once every 3 weeks. Plan: Successful trial olanzapine 2.5 mg with sertraline 50 but worse RLS Trial gabapentin for  RLS 100-300 mg PM and disc SE.  Higher if needed..  Once controlled consider increase olanzapine.  01/23/2021 appointment with the following noted: Taking gabapentin 300 mg but needs to go  higher for RLS.  SE dizzy and reduced concentration. Reads on internet.   Increased olanzapine to 5 mg HS a couple of mos ago and feels better with it.  Happier.  More relaxed.  Only think that's helped in a long time. Handwriting is so much better, less shakey. Has RLS and can be bad when she lays down. Got a new kitten and enjoys it.  Now has 4 cats. Afraid to try new meds after negative reaction to Vraylar.  Afraid of any med change.  Afraid of having SI and afraid she'd do it if had SI.   Saw pulmonologist over weakness and he says she's OK except deconditioned.  Can't tolerate wearing the masks. Plan: Successful trial olanzapine 5 mg with sertraline 50 for depression and anxiety but worse RLS Consider increasing the olanzapine later if RLS can be managed. increase gabapentin for RLS 400-600 mg PM and disc SE.  Higher if needed..  Once controlled consider increase olanzapine.  03/28/2021 appointment with the following noted: Uses Ivory soap behind knee with benefit.  Increased gabapentin to 600 mg PM.  Dizziness resolved which occurred when turned over in bed.  Not a problem. RLS still 5/7 days.  But had no vacation RLS bc was walking more.  Not active at home. Sleep is great 8-9 hours and best in a long time. Enjoyed expensive vacation to [facility].  Once in a lifetime thing.  Some things she didn't like for example the cost. Went to [employer] concert and others. Depression is better.  Olanzapine really helped.  Didn't expect it.   Hasn't felt this good in years and H notices.   Caffeine 2 cups hot tea and limited.  Past psych med trials: Wellbutrin XL 300, Sertraline, fluoxetine SE brief  Vraylar SI, Latuda, Abilify , olanzapine 5 Symbyax side effects pramipexole, modafinil,  Seroquel which caused  side effects of constipation,   lamotrigine,  Hx primidone sed refuses lithium because of altered taste.   She has a history of ataxia on 1500 mg of Depakote.   buspirone SE hallucinations ECT 2016 without help,   Review of Systems:  Review of Systems  Constitutional:  Positive for fatigue.  Respiratory:  Positive for shortness of breath.   Cardiovascular:  Negative for chest pain.  Gastrointestinal:  Positive for abdominal pain. Negative for vomiting.  Musculoskeletal:  Negative for gait problem.  Neurological:  Positive for dizziness and tremors. Negative for weakness.  Psychiatric/Behavioral:  Positive for decreased concentration and dysphoric mood. Negative for agitation, behavioral problems, confusion, hallucinations, self-injury, sleep disturbance and suicidal ideas. The patient is nervous/anxious. The patient is not hyperactive.    Medications: I have reviewed the patient's current medications.  Current Outpatient Medications  Medication Sig Dispense Refill   ALPRAZolam (XANAX) 0.5 MG tablet TAKE 1 TABLET (0.5 MG TOTAL) BY MOUTH 2 (TWO) TIMES DAILY AS NEEDED FOR ANXIETY. TAKE 1 TABLET 30 MINUTES PRIOR TO PROCEDURE 30 tablet 0   aspirin EC 325 MG tablet Take 325 mg by mouth daily.     buPROPion (WELLBUTRIN SR) 200 MG 12 hr tablet Take 1 tablet (200 mg total) by mouth every morning. 90 tablet 0   dexlansoprazole (DEXILANT) 60 MG capsule TAKE 1 CAPSULE (60 MG TOTAL) BY MOUTH DAILY. 90 capsule 3   diltiazem (CARTIA XT) 180 MG 24 hr capsule Take 1 capsule (180 mg total) by mouth daily. 90 capsule 1   fexofenadine (ALLEGRA ALLERGY) 180 MG tablet Take 1 tablet (180 mg total) by mouth  daily.     fluticasone (FLONASE) 50 MCG/ACT nasal spray Place 2 sprays into the nose daily as needed. For allergies     gabapentin (NEURONTIN) 300 MG capsule Take 2 capsules (600 mg total) by mouth at bedtime as needed. 60 capsule 1   ketoconazole (NIZORAL) 2 % cream Apply 1 application topically daily. 15  g 1   modafinil (PROVIGIL) 200 MG tablet Take 1.5 tablets (300 mg total) by mouth in the morning. 135 tablet 0   mupirocin ointment (BACTROBAN) 2 % Apply 1 application topically daily. 22 g 2   nitrofurantoin, macrocrystal-monohydrate, (MACROBID) 100 MG capsule Take 1 capsule (100 mg total) by mouth 2 (two) times daily. 14 capsule 0   NONFORMULARY OR COMPOUNDED ITEM sm Azelaic acid/ metronidazole/ivermectin 15%/1%/1% cream apply to face bid 1 each 5   Probiotic Product (PROBIOTIC DAILY PO) Take 1 tablet by mouth daily.      propranolol (INDERAL) 20 MG tablet TAKE 1 TABLET (20 MG TOTAL) BY MOUTH 2 (TWO) TIMES DAILY. 180 tablet 3   sertraline (ZOLOFT) 50 MG tablet Take 1.5 tablets (75 mg total) by mouth daily. 135 tablet 0   tretinoin (RETIN-A) 0.025 % cream Apply topically at bedtime. 45 g 4   trimethoprim (TRIMPEX) 100 MG tablet TAKE AFTER INTERCOURSE AS DIRECTED 30 tablet 2   No current facility-administered medications for this visit.    Medication Side Effects: None  Allergies:  Allergies  Allergen Reactions   Cariprazine Other (See Comments)    Patient becomes suicidal when taking this medication.  Patient becomes suicidal when taking this medication.    Metoprolol Other (See Comments)    Hallucinations hair falls out Hallucinations hair falls out   Nickel Rash   Iron Nausea And Vomiting    Past Medical History:  Diagnosis Date   Anxiety    Atrial fibrillation ([place])    a. Event monitor 2013 - PACs/bradycardia/SVT/short run of atrial fib by event monitor.    BIPOLAR DISORDER UNSPECIFIED    Bradycardia    Bursitis of hip 09/1999   Bilateral - Dr. Berenice Primas   DEPRESSION    Emphysema lung ([place]) 06/20/2016   pt states pulmonalongist stated started recently   GERD    Headache(784.0)    was with menstrual cycle. no longer a problem   HYPERLIPIDEMIA    Hypertension    Hypertension    IRRITABLE BOWEL SYNDROME, HX OF    Menopausal state 01/2012   [facility] = 88.5   Mitral valve  prolapse 12/17/2000   a. dx 1980s, most recent echo did not demonstrate this.   MYALGIA    Normal coronary arteries    a. by cardiac CT 2013.   PAT (paroxysmal atrial tachycardia) (HCC)    PE (pulmonary embolism) 02/09/2015   a. Bilateral PEs 01/2015 when d-dimer 0.69, CP lying on left side. Followed by heme-onc - + lupus anticoagulant, mildly depressed protein S. Dr. Marin Olp is not certain if her hypercoagulable studies are significant for a thrombophilic state.   Premature atrial contractions    Pulmonary embolus (HCC) 02/10/2015   Shortness of breath    Pulmonary eval 05/2002   Thyroid nodule 2014   Tremors of nervous system     Family History  Problem Relation Age of Onset   Lung cancer Mother    Hypertension Mother    Hyperlipidemia Mother    Cancer Father        Esophageal cancer   Hyperlipidemia Father    Depression Father  Cancer Brother    Pulmonary embolism Brother    Colon cancer Paternal Uncle    Sarcoidosis Other    Testicular cancer Other     Social History   Socioeconomic History   Marital status: Married    Spouse name: Not on file   Number of children: 0   Years of education: Not on file   Highest education level: Not on file  Occupational History   Occupation: Nurse-Personal Care/HH    Employer: UNEMPLOYED  Tobacco Use   Smoking status: Former    Packs/day: 1.00    Years: 25.00    Pack years: 25.00    Types: Cigarettes    Quit date: 02/11/1996    Years since quitting: 25.1   Smokeless tobacco: Never   Tobacco comments:    Married, lives with spouse. 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   Please see review of systems for further details on the patient's review from today.   Objective:   Physical Exam:  LMP 05/11/2012   Physical Exam Neurological:     Mental Status: She is alert and oriented to person, place, and time.     Cranial Nerves: No dysarthria.     Motor: Tremor present.  Psychiatric:        Attention and Perception: Attention and perception normal.        Mood and Affect: Mood is anxious and depressed.        Speech: Speech normal. Speech is not slurred.        Behavior: Behavior is cooperative.        Thought Content: Thought content normal. Thought content is not paranoid or delusional. Thought content does not include homicidal or suicidal ideation. Thought content does not include suicidal plan.        Cognition and Memory: Cognition and memory normal.        Judgment: Judgment normal.     Comments: Insight intact Depression and anxiety are markedly better not gone back on sertraline with olanzapine 5 mg added    Lab Review:     Component Value Date/Time   NA 139 12/04/2020 1805   NA 145 (H) 04/05/2020 1508   NA 142 01/21/2017 1013   NA 143 01/16/2016 1005   K 4.1 12/04/2020 1805   K 3.2 (L) 01/21/2017 1013   K 3.3 (L) 01/16/2016 1005   CL 104 12/04/2020 1805   CL 106 01/21/2017 1013   CO2 25 12/04/2020 1805   CO2 29 01/21/2017 1013   CO2 20 (L) 01/16/2016 1005  GLUCOSE 128 (H) 12/04/2020 1805   GLUCOSE 95 01/21/2017 1013   BUN 11 12/04/2020 1805   BUN 14 04/05/2020 1508   BUN 9 01/21/2017  1013   BUN 12.6 01/16/2016 1005   CREATININE 1.07 12/04/2020 1805   CREATININE 0.94 12/20/2019 1449   CREATININE 0.9 01/21/2017 1013   CREATININE 0.9 01/16/2016 1005   CALCIUM 10.1 12/04/2020 1805   CALCIUM 9.2 01/21/2017 1013   CALCIUM 9.5 01/16/2016 1005   PROT 7.9 12/04/2020 1805   PROT 7.2 04/05/2020 1508   PROT 6.9 01/21/2017 1013   PROT 7.1 01/16/2016 1005   ALBUMIN 5.0 12/04/2020 1805   ALBUMIN 4.6 04/05/2020 1508   ALBUMIN 3.6 01/16/2016 1005   AST 28 12/04/2020 1805   AST 13 (L) 12/20/2019 1449   AST 16 01/16/2016 1005   ALT 28 12/04/2020 1805   ALT 15 12/20/2019 1449   ALT 22 01/21/2017 1013   ALT 24 01/16/2016 1005   ALKPHOS 88 12/04/2020 1805   ALKPHOS 62 01/21/2017 1013   ALKPHOS 81 01/16/2016 1005   BILITOT 0.3 12/04/2020 1805   BILITOT <0.2 04/05/2020 1508   BILITOT 0.3 12/20/2019 1449   BILITOT 0.59 01/16/2016 1005   GFRNONAA 76 04/05/2020 1508   GFRNONAA >60 12/20/2019 1449   GFRAA 87 04/05/2020 1508   GFRAA >60 12/03/2018 1346       Component Value Date/Time   WBC 9.7 12/04/2020 1805   RBC 5.18 (H) 12/04/2020 1805   HGB 13.9 12/04/2020 1805   HGB 13.7 04/05/2020 1508   HGB 12.8 01/21/2017 1013   HCT 43.6 12/04/2020 1805   HCT 41.4 04/05/2020 1508   HCT 38.2 01/21/2017 1013   PLT 395.0 12/04/2020 1805   PLT 289 04/05/2020 1508   MCV 84.3 12/04/2020 1805   MCV 85 04/05/2020 1508   MCV 90 01/21/2017 1013   MCH 28.0 04/05/2020 1508   MCH 28.9 12/20/2019 1449   MCHC 31.9 12/04/2020 1805   RDW 16.3 (H) 12/04/2020 1805   RDW 15.0 04/05/2020 1508   RDW 14.5 01/21/2017 1013   LYMPHSABS 3.4 12/04/2020 1805   LYMPHSABS 2.5 04/05/2020 1508   LYMPHSABS 3.6 (H) 01/21/2017 1013   MONOABS 0.6 12/04/2020 1805   EOSABS 0.2 12/04/2020 1805   EOSABS 0.1 04/05/2020 1508   EOSABS 0.5 01/21/2017 1013   BASOSABS 0.1 12/04/2020 1805   BASOSABS 0.1 04/05/2020 1508   BASOSABS 0.0 01/21/2017 1013    No results found for: POCLITH, LITHIUM   Lab Results   Component Value Date   PHENYTOIN <0.5 ug/mL (L) 11/05/2006   VALPROATE 73.7 11/25/2016     .res Assessment: Plan:    There are no diagnoses linked to this encounter.  Poor sleep hygiene.  Improved with modafinil and motivation is better with it but wants to increase it.  TRD.  Lifelong history of chronic anhedonic depression and anxiety with very poor functioning continues.  Prognosis is guarded bc multiple med failures and her resistance to change and chronicity of sx and low motivation for change.  She was getting some benefit from the prior medications a little bit more active and interested and motivated with the addition of modafinil without side effects.  She is highly dysfunctional and much worse off the sertraline for months..  She is not currently manic.  She is tolerating the medications.  She is had multiple medication failures as noted above.  She is fearful of trying new medications because of a history of suicidal thoughts on Vraylar.  Failed all FDA approved bipolar depression meds except Symbyax.  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  Her symptoms are not gone and she is still severely impaired in function especially socially but she is much happier and much less anxious.  She wonders about increasing the olanzapine if she could get further improvement.  However she has had restless legs syndrome as a side effect which is currently partially managed.  Best response ever with current med regimen.  Successful trial olanzapine 5 mg with sertraline 50 for depression and anxiety but worse RLS Consider increasing the olanzapine later if RLS can be managed.  increase gabapentin for ok to increase RLS 600-900 mg PM and disc SE.  Higher if needed..  Once controlled consider increase olanzapine. Caffeine can worsen RLS Caution about excessive dizziness from  gabapentin and fall risk.  She will adjust the dosage as needed between 609 100 mg at night.  Discussed potential metabolic side effects associated with atypical antipsychotics, as well as potential risk for movement side effects. Advised pt to contact office if movement side effects occur.  For tremor is considering increasing the propranolol. She'll check with cardiologist on this.  Push fluids bc so much Time in bed and past history of dehydration. Disc risk propranolol but it helps tremor.  Increase activity.  Work on sleep schedule and try to regulate it for overall mental health benefits.  Keep working on improving activity.  Every little bit of progress can be additive over time.  Discussed the Cognitive behavioral approaches.  Doesn't drive much.  She is not motivated to do these things right now.  Modafinil would be likely safer than traditional stimulants which could cause psychotic sx.  Discussed side effects.  She has had none.   Continue other meds. Dont' increase further, Benefit 400 mg modafinil.  Okay to continue the dose reduced to 200 mg daily.  This appt was 30 mins.  FU 12 weeks  Lynder Parents, MD, DFAPA   Please see After Visit Summary for patient specific instructions.  Future Appointments  Date Time Provider [place]  03/28/2021  4:00 PM Cottle, Billey Co., MD [facility] None  04/10/2021  3:00 PM Bauert, Nicolasa Ducking, LCSW [facility] None  04/25/2021  2:00 PM Ann Held, DO [facility]  05/01/2021  3:00 PM Bauert, Nicolasa Ducking, LCSW [facility] None  05/07/2021  3:00 PM Bauert, Nicolasa Ducking, LCSW [facility] None  01/16/2022  2:20 PM Moye, [place], MD [facility] None    No orders of the defined types were placed in this encounter.     -------------------------------

## 2021-03-29 ENCOUNTER — Other Ambulatory Visit (HOSPITAL_COMMUNITY): Payer: Self-pay

## 2021-04-01 ENCOUNTER — Other Ambulatory Visit (HOSPITAL_COMMUNITY): Payer: Self-pay

## 2021-04-03 ENCOUNTER — Other Ambulatory Visit (HOSPITAL_COMMUNITY): Payer: Self-pay

## 2021-04-04 ENCOUNTER — Other Ambulatory Visit (HOSPITAL_COMMUNITY): Payer: Self-pay

## 2021-04-04 ENCOUNTER — Encounter: Payer: Self-pay | Admitting: Dermatology

## 2021-04-04 ENCOUNTER — Encounter: Payer: Self-pay | Admitting: Family Medicine

## 2021-04-08 LAB — ANAEROBIC AND AEROBIC CULTURE

## 2021-04-09 ENCOUNTER — Telehealth: Payer: Self-pay

## 2021-04-09 ENCOUNTER — Other Ambulatory Visit: Payer: Self-pay

## 2021-04-09 ENCOUNTER — Other Ambulatory Visit (HOSPITAL_COMMUNITY): Payer: Self-pay

## 2021-04-09 MED ORDER — CEPHALEXIN 500 MG PO CAPS
500.0000 mg | ORAL_CAPSULE | Freq: Three times a day (TID) | ORAL | 0 refills | Status: DC
  Filled 2021-04-09: qty 15, 5d supply, fill #0

## 2021-04-09 NOTE — Telephone Encounter (Signed)
Spoke with patient and advised culture results showed normal skin bacteria. Patient advises she does have a pustule that is tender and would like to pursue oral abx. Lurlean Horns., RMA

## 2021-04-09 NOTE — Telephone Encounter (Signed)
Please send in cephalexin 500 mg 3 times a day to take for 5 days. Thank you!

## 2021-04-09 NOTE — Progress Notes (Signed)
Rx sent to pharmacy. Called patient to advise, N/A. LMOVM Cephalexin 500mg  1 po tid x5 days sent to [place]. JP

## 2021-04-09 NOTE — Telephone Encounter (Signed)
Rx sent to pharmacy. Called patient to advise, N/A. LMOVM Cephalexin 500mg  1 po tid x5 days sent to [facility]. JP

## 2021-04-09 NOTE — Telephone Encounter (Signed)
-----   Message from Alfonso Patten, MD sent at 04/08/2021 10:10 PM EST ----- Culture grew cutibacterium acnes and staph epidermidis, both common skin bacteria. If doing well, no additional treatment needed. If getting pustules, rash, tenderness at face, please let me know and we could consider po antibiotic therapy.  MAs please call. Thank you!

## 2021-04-10 ENCOUNTER — Ambulatory Visit (INDEPENDENT_AMBULATORY_CARE_PROVIDER_SITE_OTHER): Payer: 59 | Admitting: Psychology

## 2021-04-10 DIAGNOSIS — F331 Major depressive disorder, recurrent, moderate: Secondary | ICD-10-CM | POA: Diagnosis not present

## 2021-04-10 NOTE — Progress Notes (Signed)
[facility] Counselor/Therapist Progress Note ? ?Patient ID: Beverly Cole, MRN: [id number],   ? ?Date: 04/10/2021 ? ?Time Spent: 3:00pm-4:00pm   60 minutes  ? ?Treatment Type: Individual Therapy ? ?Reported Symptoms: worrying, sadness ? ?Mental Status Exam: ?Appearance:  Casual     ?Behavior: Appropriate  ?Motor: Normal  ?Speech/Language:  Normal Rate  ?Affect: Appropriate  ?Mood: normal  ?Thought process: normal  ?Thought content:   WNL  ?Sensory/Perceptual disturbances:   WNL  ?Orientation: oriented to person, place, time/date, and situation  ?Attention: Good  ?Concentration: Good  ?Memory: WNL  ?Fund of knowledge:  Good  ?Insight:   Good  ?Judgment:  Good  ?Impulse Control: Good  ? ?Risk Assessment: ?Danger to Self:  No ?Self-injurious Behavior: No ?Danger to Others: No ?Duty to Warn:no ?Physical Aggression / Violence:No  ?Access to Firearms a concern: No  ?Gang Involvement:No  ? ?Subjective: Pt present for face-to-face therapy via Webex.  Pt consents to telehealth video session due to COVID-19 pandemic.     ?Location of pt: home.  ?Location of therapist: home office ?Pt talked about her trip to [place].  She states the trip was very disappointing.   The butler and help was not nice there.  They were not gracious about the tips pt and [name] gave even though pt felt they gave a lot of money.  The food was not very good.  Pt did not do much other than stay in the hut.   She did go in the water once.   Addressed pt's disappointments.   ?Pt states that now she feels a little depressed bc the trip was so disappointing and she does not have something to look forward.    ?Pt talked about her husband's job.  There have been many layoffs at the company but [employer] job will not be impacted.   ?Pt talked about her niece and nephew and uncle.  Pt has helped her niece and nephew with their career decisions.  Pt's uncle was in jail for a period of time when he was 1 years old.  Her uncle is deceased now.  Pt feels  like her niece and nephew expect her to give them money but pt states she can't.  Addressed the pressure pt feels.   ?Pt is feeling motivated to set goals.  Pt would like to lose weight.  She would also like to clean her basket collection.  Worked with pt on goal setting and making the goals small enough to be doable and sustainable.     ?Provided supportive counseling.  ? ?Interventions: Cognitive Behavioral Therapy and Insight-Oriented ? ?Diagnosis: F33.1 ? ?Plan: See pt's Treatment Plan for depression in Therapy Charts.  (Treatment Plan Target Date: 10/25/2021) ?Pt is progressing toward treatment goals.   ?Plan to continue to see pt every month.  ? ?Zacari Stiff Estrella Myrtle, LCSW ? ? ? ?

## 2021-04-17 ENCOUNTER — Other Ambulatory Visit (HOSPITAL_COMMUNITY): Payer: Self-pay

## 2021-04-25 ENCOUNTER — Encounter: Payer: Self-pay | Admitting: Family Medicine

## 2021-04-25 ENCOUNTER — Ambulatory Visit (INDEPENDENT_AMBULATORY_CARE_PROVIDER_SITE_OTHER): Payer: 59 | Admitting: Family Medicine

## 2021-04-25 VITALS — BP 110/80 | HR 76 | Temp 97.8°F | Resp 18 | Ht 69.0 in | Wt 170.4 lb

## 2021-04-25 DIAGNOSIS — E785 Hyperlipidemia, unspecified: Secondary | ICD-10-CM | POA: Diagnosis not present

## 2021-04-25 DIAGNOSIS — Z1211 Encounter for screening for malignant neoplasm of colon: Secondary | ICD-10-CM | POA: Diagnosis not present

## 2021-04-25 DIAGNOSIS — F3181 Bipolar II disorder: Secondary | ICD-10-CM | POA: Diagnosis not present

## 2021-04-25 DIAGNOSIS — I10 Essential (primary) hypertension: Secondary | ICD-10-CM | POA: Diagnosis not present

## 2021-04-25 DIAGNOSIS — I471 Supraventricular tachycardia: Secondary | ICD-10-CM | POA: Diagnosis not present

## 2021-04-25 DIAGNOSIS — Z0000 Encounter for general adult medical examination without abnormal findings: Secondary | ICD-10-CM | POA: Diagnosis not present

## 2021-04-25 DIAGNOSIS — E039 Hypothyroidism, unspecified: Secondary | ICD-10-CM | POA: Diagnosis not present

## 2021-04-25 DIAGNOSIS — I2602 Saddle embolus of pulmonary artery with acute cor pulmonale: Secondary | ICD-10-CM | POA: Diagnosis not present

## 2021-04-25 DIAGNOSIS — D6859 Other primary thrombophilia: Secondary | ICD-10-CM | POA: Diagnosis not present

## 2021-04-25 NOTE — Assessment & Plan Note (Signed)
Well controlled, no changes to meds. Encouraged heart healthy diet such as the DASH diet and exercise as tolerated.  °

## 2021-04-25 NOTE — Assessment & Plan Note (Signed)
Encourage heart healthy diet such as MIND or DASH diet, increase exercise, avoid trans fats, simple carbohydrates and processed foods, consider a krill or fish or flaxseed oil cap daily.  °

## 2021-04-25 NOTE — Assessment & Plan Note (Signed)
Check labs 

## 2021-04-25 NOTE — Assessment & Plan Note (Signed)
On aspirin 325 mg daily ?

## 2021-04-25 NOTE — Progress Notes (Signed)
? ?Subjective:  ? ?By signing my name below, I, [name], attest that this documentation has been prepared under the direction and in the presence of Stacia, Feazell, DO. 04/25/2021 ? ? ? Patient ID: Beverly Cole, female    DOB: [birth year], 61 y.o.   MRN: [id number] ? ?Chief Complaint  ?Patient presents with  ? Annual Exam  ?  Pt states fasting   ? ? ?HPI ?Patient is in today for a comprehensive physical exam.  ?She continues complains of a productive cough for the past 6 months. She denies coughing up anything. She is taking OTC allegra daily PO and Flonase nasal spray to manage her symptoms.  ?She reports taking tramadol prior to her plane ride during her vacation and reports finding relief.  ?She continues seeing her dermatologist regularly.  ?She denies having any fever, new muscle pain, new joint pain, new moles, congestion, sinus pain, sore throat, chest pain, palpations, SOB, wheezing, n/v/d, constipation, blood in stool, dysuria, frequency, hematuria, or headaches at this time. ?She has no recent changes to her family medical history.  ? ? ?Past Medical History:  ?Diagnosis Date  ? Anxiety   ? Atrial fibrillation ([place])   ? a. Event monitor 2013 - PACs/bradycardia/SVT/short run of atrial fib by event monitor.   ? BIPOLAR DISORDER UNSPECIFIED   ? Bradycardia   ? Bursitis of hip 09/1999  ? Bilateral - Dr. Berenice Primas  ? DEPRESSION   ? Emphysema lung ([place]) 06/20/2016  ? pt states pulmonalongist stated started recently  ? GERD   ? Headache(784.0)   ? was with menstrual cycle. no longer a problem  ? HYPERLIPIDEMIA   ? Hypertension   ? Hypertension   ? IRRITABLE BOWEL SYNDROME, HX OF   ? Menopausal state 01/2012  ? FSH = 88.5  ? Mitral valve prolapse 12/17/2000  ? a. dx 1980s, most recent echo did not demonstrate this.  ? MYALGIA   ? Normal coronary arteries   ? a. by cardiac CT 2013.  ? PAT (paroxysmal atrial tachycardia) ([place])   ? PE (pulmonary embolism) 02/09/2015  ? a. Bilateral PEs 01/2015 when d-dimer  0.69, CP lying on left side. Followed by heme-onc - + lupus anticoagulant, mildly depressed protein S. Dr. Marin Olp is not certain if her hypercoagulable studies are significant for a thrombophilic state.  ? Premature atrial contractions   ? Pulmonary embolus ([place]) 02/10/2015  ? Shortness of breath   ? Pulmonary eval 05/2002  ? Thyroid nodule 2014  ? Tremors of nervous system   ? ? ?Past Surgical History:  ?Procedure Laterality Date  ? COLONOSCOPY    ? LAPAROSCOPIC APPENDECTOMY N/A 03/17/2017  ? Procedure: APPENDECTOMY LAPAROSCOPIC;  Surgeon: Excell Seltzer, MD;  Location: [facility];  Service: General;  Laterality: N/A;  ? [place] (R) side  1990  ? Right back  ? ? ?Family History  ?Problem Relation Age of Onset  ? Lung cancer Mother   ? Hypertension Mother   ? Hyperlipidemia Mother   ? Cancer Father   ?     Esophageal cancer  ? Hyperlipidemia Father   ? Depression Father   ? Cancer Brother   ? Pulmonary embolism Brother   ? Colon cancer Paternal Uncle   ? Sarcoidosis Other   ? Testicular cancer Other   ? ? ?Social History  ? ?Socioeconomic History  ? Marital status: Married  ?  Spouse name: Not on file  ? Number of children: 0  ? Years of education:  Not on file  ? Highest education level: Not on file  ?Occupational History  ? Occupation: Nurse-Personal Care/HH  ?  Employer: UNEMPLOYED  ?Tobacco Use  ? Smoking status: Former  ?  Packs/day: 1.00  ?  Years: 25.00  ?  Pack years: 25.00  ?  Types: Cigarettes  ?  Quit date: 02/11/1996  ?  Years since quitting: 25.2  ? Smokeless tobacco: Never  ? Tobacco comments:  ?  Married, lives with spouse. 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 Vaping Use: Never used  ?Substance and Sexual Activity  ? Alcohol use: Yes  ?  Comment: occ  ? Drug use: No  ? Sexual activity: Yes  ?  Partners: Male  ?  Birth control/protection: Post-menopausal  ?Other Topics Concern  ? Not on file  ?Social History Narrative  ? Married, lives with spouse in [place]. 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   ? No exercise  ? Pt is on nutrisystem right now  ? ?Social Determinants of Health  ? ?Financial Resource Strain: Not on file  ?Food Insecurity: Not on file  ?Transportation Needs: Not on file  ?Physical Activity: Not on file  ?Stress: Not on file  ?Social Connections: Not on file  ?Intimate Partner Violence: Not on file  ? ? ?Outpatient Medications Prior to Visit  ?Medication Sig Dispense Refill  ? ALPRAZolam (XANAX) 0.5 MG tablet TAKE 1 TABLET (0.5 MG TOTAL) BY MOUTH 2 (TWO) TIMES DAILY AS NEEDED FOR ANXIETY. TAKE 1 TABLET 30 MINUTES PRIOR TO PROCEDURE 30 tablet 0  ? aspirin EC 325 MG tablet Take 325 mg by mouth daily.    ? buPROPion (WELLBUTRIN SR) 200 MG 12 hr tablet Take 1 tablet (200 mg total) by mouth every morning. 90 tablet 0  ? dexlansoprazole (DEXILANT) 60 MG capsule TAKE 1 CAPSULE (60 MG TOTAL) BY MOUTH DAILY. 90 capsule 3  ? diltiazem (CARTIA XT) 180 MG 24 hr capsule Take 1 capsule (180 mg total) by mouth daily. 90 capsule 1  ? fexofenadine (ALLEGRA ALLERGY) 180 MG tablet Take 1 tablet (180 mg total) by mouth daily.    ? fluticasone (FLONASE) 50 MCG/ACT nasal spray Place 2 sprays into the nose daily as needed. For allergies    ? gabapentin (NEURONTIN) 300 MG capsule Take 2-3 capsules (600-900 mg total) by mouth nightly for restless legs 270 capsule 0  ? modafinil (PROVIGIL) 200 MG tablet Take 1.5 tablets (300 mg total) by mouth in the morning. (Patient taking differently: Take 300 mg by mouth in the morning. 1 tab daily) 135 tablet 0  ? NONFORMULARY OR COMPOUNDED ITEM sm Azelaic acid/ metronidazole/ivermectin 15%/1%/1% cream apply to face bid 1 each 5  ? OLANZapine (ZYPREXA) 5 MG tablet Take 1 tablet (5 mg total) by mouth at bedtime. 90 tablet 0  ? Probiotic Product (PROBIOTIC DAILY PO) Take 1 tablet by mouth daily.     ? propranolol (INDERAL) 20 MG tablet TAKE 1 TABLET (20 MG TOTAL) BY MOUTH 2 (TWO) TIMES DAILY. 180 tablet 3  ? sertraline (ZOLOFT) 50 MG tablet Take 1.5  tablets (75 mg total) by mouth daily. (Patient taking differently: Take 50 mg by mouth daily.) 135 tablet 0  ? tretinoin (RETIN-A) 0.025 % cream Apply topically at bedtime. 45 g 4  ? trimethoprim (TRIMPEX) 100 MG tablet TAKE AFTER INTERCOURSE AS DIRECTED 30 tablet 2  ? nitrofurantoin, macrocrystal-monohydrate, (MACROBID) 100 MG capsule Take 1 capsule (  100 mg total) by mouth 2 (two) times daily. 14 capsule 0  ? cephALEXin (KEFLEX) 500 MG capsule Take 1 capsule (500 mg total) by mouth 3 (three) times daily for 5 days (Patient not taking: Reported on 04/25/2021) 15 capsule 0  ? ketoconazole (NIZORAL) 2 % cream Apply 1 application topically daily. (Patient not taking: Reported on 04/25/2021) 15 g 1  ? mupirocin ointment (BACTROBAN) 2 % Apply 1 application topically daily. (Patient not taking: Reported on 04/25/2021) 22 g 2  ? ?No facility-administered medications prior to visit.  ? ? ?Allergies  ?Allergen Reactions  ? Cariprazine Other (See Comments)  ?  Patient becomes suicidal when taking this medication.  ?Patient becomes suicidal when taking this medication.   ? Metoprolol Other (See Comments)  ?  Hallucinations hair falls out ?Hallucinations hair falls out  ? Nickel Rash  ? Iron Nausea And Vomiting  ? ? ?Review of Systems  ?Constitutional:  Negative for fever.  ?HENT:  Negative for congestion, sinus pain and sore throat.   ?Respiratory:  Positive for cough (productive). Negative for sputum production, shortness of breath and wheezing.   ?Cardiovascular:  Negative for chest pain and palpitations.  ?Gastrointestinal:  Negative for blood in stool, constipation, diarrhea, nausea and vomiting.  ?Genitourinary:  Negative for dysuria, frequency and hematuria.  ?Musculoskeletal:  Negative for joint pain and myalgias.  ?Skin:   ?     (-)New moles  ?Neurological:  Negative for headaches.  ? ?   ?Objective:  ?  ?Physical Exam ?Constitutional:   ?   General: She is not in acute distress. ?   Appearance: Normal appearance. She is  not ill-appearing.  ?HENT:  ?   Head: Normocephalic and atraumatic.  ?   Right Ear: Tympanic membrane, ear canal and external ear normal.  ?   Left Ear: Tympanic membrane, ear canal and external ear normal.  ?Ey

## 2021-04-25 NOTE — Assessment & Plan Note (Signed)
Per psych 

## 2021-04-25 NOTE — Patient Instructions (Signed)

## 2021-04-26 LAB — COMPREHENSIVE METABOLIC PANEL
ALT: 19 U/L (ref 0–35)
AST: 18 U/L (ref 0–37)
Albumin: 4.4 g/dL (ref 3.5–5.2)
Alkaline Phosphatase: 87 U/L (ref 39–117)
BUN: 12 mg/dL (ref 6–23)
CO2: 25 mEq/L (ref 19–32)
Calcium: 10.1 mg/dL (ref 8.4–10.5)
Chloride: 105 mEq/L (ref 96–112)
Creatinine, Ser: 0.93 mg/dL (ref 0.40–1.20)
GFR: 66.38 mL/min (ref >60.00)
Glucose, Bld: 104 mg/dL — ABNORMAL HIGH (ref 70–99)
Potassium: 3.9 mEq/L (ref 3.5–5.1)
Sodium: 141 mEq/L (ref 135–145)
Total Bilirubin: 0.3 mg/dL (ref 0.2–1.2)
Total Protein: 7.2 g/dL (ref 6.0–8.3)

## 2021-04-26 LAB — CBC WITH DIFFERENTIAL/PLATELET
Basophils Absolute: 0.1 10*3/uL (ref 0.0–0.1)
Basophils Relative: 0.9 % (ref 0.0–3.0)
Eosinophils Absolute: 0.2 10*3/uL (ref 0.0–0.7)
Eosinophils Relative: 2.5 % (ref 0.0–5.0)
HCT: 38.3 % (ref 36.0–46.0)
Hemoglobin: 12.4 g/dL (ref 12.0–15.0)
Lymphocytes Relative: 31.9 % (ref 12.0–46.0)
Lymphs Abs: 2.3 10*3/uL (ref 0.7–4.0)
MCHC: 32.5 g/dL (ref 30.0–36.0)
MCV: 81.4 fl (ref 78.0–100.0)
Monocytes Absolute: 0.3 10*3/uL (ref 0.1–1.0)
Monocytes Relative: 4.6 % (ref 3.0–12.0)
Neutro Abs: 4.3 10*3/uL (ref 1.4–7.7)
Neutrophils Relative %: 60.1 % (ref 43.0–77.0)
Platelets: 316 10*3/uL (ref 150.0–400.0)
RBC: 4.7 Mil/uL (ref 3.87–5.11)
RDW: 16.3 % — ABNORMAL HIGH (ref 11.5–15.5)
WBC: 7.2 10*3/uL (ref 4.0–10.5)

## 2021-04-26 LAB — LIPID PANEL
Cholesterol: 301 mg/dL — ABNORMAL HIGH (ref 0–200)
HDL: 43.5 mg/dL (ref >39.00)
NonHDL: 257.64
Total CHOL/HDL Ratio: 7
Triglycerides: 255 mg/dL — ABNORMAL HIGH (ref 0.0–149.0)
VLDL: 51 mg/dL — ABNORMAL HIGH (ref 0.0–40.0)

## 2021-04-26 LAB — TSH: TSH: 4.73 u[IU]/mL (ref 0.35–5.50)

## 2021-04-26 LAB — LDL CHOLESTEROL, DIRECT: Direct LDL: 233 mg/dL

## 2021-04-30 ENCOUNTER — Other Ambulatory Visit: Payer: Self-pay | Admitting: Family Medicine

## 2021-04-30 DIAGNOSIS — K219 Gastro-esophageal reflux disease without esophagitis: Secondary | ICD-10-CM

## 2021-04-30 DIAGNOSIS — R251 Tremor, unspecified: Secondary | ICD-10-CM

## 2021-05-01 ENCOUNTER — Other Ambulatory Visit (HOSPITAL_COMMUNITY): Payer: Self-pay

## 2021-05-01 ENCOUNTER — Ambulatory Visit (INDEPENDENT_AMBULATORY_CARE_PROVIDER_SITE_OTHER): Payer: 59 | Admitting: Psychology

## 2021-05-01 DIAGNOSIS — F331 Major depressive disorder, recurrent, moderate: Secondary | ICD-10-CM | POA: Diagnosis not present

## 2021-05-01 MED ORDER — PROPRANOLOL HCL 20 MG PO TABS
20.0000 mg | ORAL_TABLET | Freq: Two times a day (BID) | ORAL | 1 refills | Status: DC
  Filled 2021-05-01: qty 180, 90d supply, fill #0
  Filled 2021-07-30: qty 180, 90d supply, fill #1

## 2021-05-01 MED ORDER — DEXLANSOPRAZOLE 60 MG PO CPDR
1.0000 | DELAYED_RELEASE_CAPSULE | Freq: Every day | ORAL | 1 refills | Status: DC
  Filled 2021-05-01: qty 61, 61d supply, fill #0
  Filled 2021-05-01: qty 29, 29d supply, fill #0
  Filled 2021-07-18: qty 90, 90d supply, fill #1

## 2021-05-01 NOTE — Progress Notes (Signed)
[facility] Counselor/Therapist Progress Note ? ?Patient ID: Beverly Cole, MRN: [id number],   ? ?Date: 05/01/2021 ? ?Time Spent: 3:00pm-3:55pm   55 minutes  ? ?Treatment Type: Individual Therapy ? ?Reported Symptoms: worrying, sadness ? ?Mental Status Exam: ?Appearance:  Casual     ?Behavior: Appropriate  ?Motor: Normal  ?Speech/Language:  Normal Rate  ?Affect: Appropriate  ?Mood: normal  ?Thought process: normal  ?Thought content:   WNL  ?Sensory/Perceptual disturbances:   WNL  ?Orientation: oriented to person, place, time/date, and situation  ?Attention: Good  ?Concentration: Good  ?Memory: WNL  ?Fund of knowledge:  Good  ?Insight:   Good  ?Judgment:  Good  ?Impulse Control: Good  ? ?Risk Assessment: ?Danger to Self:  No ?Self-injurious Behavior: No ?Danger to Others: No ?Duty to Warn:no ?Physical Aggression / Violence:No  ?Access to Firearms a concern: No  ?Gang Involvement:No  ? ?Subjective: Pt present for face-to-face therapy via Webex.  Pt consents to telehealth video session due to COVID-19 pandemic.     ?Location of pt: home.  ?Location of therapist: home office ?Pt talked about not feeling like doing anything.  She was feeling good until after her vacation.  Since then pt had felt down and has had less energy and motivation.   Pt feels like part of the problem is not having something to look forward to.  Pt wants to plan another trip to have something to look forward to.    Worked with pt on goal setting and making the goals small enough to be doable and sustainable.   ?Pt saw her PCP last week.   She plans to call her psychiatrist to address adjusting her medications.   ?Pt talked about working on buying a new car.  She is looking at [facility] [employer] [facility] [employer] and may get a RAV4 hybrid.  The decision making process is stressful at times.      ?Provided supportive counseling.  ? ?Interventions: Cognitive Behavioral Therapy and Insight-Oriented ? ?Diagnosis: F33.1 ? ?Plan: See pt's Treatment Plan for depression  in Therapy Charts.  (Treatment Plan Target Date: 10/25/2021) ?Pt is progressing toward treatment goals.   ?Plan to continue to see pt every month.  ? ?Clint Bolder, LCSW ? ? ?

## 2021-05-02 ENCOUNTER — Other Ambulatory Visit (HOSPITAL_COMMUNITY): Payer: Self-pay

## 2021-05-07 ENCOUNTER — Ambulatory Visit: Payer: 59 | Admitting: Psychology

## 2021-05-12 ENCOUNTER — Telehealth: Payer: 59 | Admitting: Physician Assistant

## 2021-05-12 DIAGNOSIS — T17908A Unspecified foreign body in respiratory tract, part unspecified causing other injury, initial encounter: Secondary | ICD-10-CM

## 2021-05-12 DIAGNOSIS — R051 Acute cough: Secondary | ICD-10-CM

## 2021-05-13 ENCOUNTER — Ambulatory Visit (INDEPENDENT_AMBULATORY_CARE_PROVIDER_SITE_OTHER): Payer: 59

## 2021-05-13 ENCOUNTER — Other Ambulatory Visit (HOSPITAL_COMMUNITY): Payer: Self-pay

## 2021-05-13 ENCOUNTER — Ambulatory Visit
Admission: RE | Admit: 2021-05-13 | Discharge: 2021-05-13 | Disposition: A | Discharge status: Home / Self Care | Payer: 59 | Source: Ambulatory Visit | Attending: Internal Medicine | Admitting: Internal Medicine

## 2021-05-13 VITALS — BP 121/78 | HR 99 | Temp 98.0°F | Resp 18 | Ht 69.0 in | Wt 172.0 lb

## 2021-05-13 DIAGNOSIS — R059 Cough, unspecified: Secondary | ICD-10-CM | POA: Diagnosis not present

## 2021-05-13 DIAGNOSIS — R051 Acute cough: Secondary | ICD-10-CM | POA: Diagnosis not present

## 2021-05-13 MED ORDER — AMOXICILLIN-POT CLAVULANATE 875-125 MG PO TABS
1.0000 | ORAL_TABLET | Freq: Two times a day (BID) | ORAL | 0 refills | Status: DC
  Filled 2021-05-13: qty 14, 7d supply, fill #0

## 2021-05-13 NOTE — Progress Notes (Signed)
Based on what you shared with me, I feel your condition warrants further evaluation and I recommend that you be seen in a face to face visit. ? ?In my medical opinion, if you feel that aspirating some food may have triggered this, you should be seen in person for better evaluation as you could be at risk for aspiration pneumonia, which can cause atypical infections that may be more difficult to treat. This should be ruled out for you.  ?  ?NOTE: There will be NO CHARGE for this eVisit ?  ?If you are having a true medical emergency please call 911.   ?  ? For an urgent face to face visit, [facility] has six urgent care centers for your convenience:  ?  ? [facility] ?Get Driving Directions ?[phone] ?[address] ?[place], [place] [address] ?  ? [facility]) ?Get Driving Directions ?[phone] ?[address] ?[place], [place] [address] ? ?[facility]) ?Get Driving Directions ?[address] [place],  [place]  [address] ? ?[facility] ?Get Driving Directions ?[phone] ?[address], [address] ?[place], [place] [address] ?  ?[facility] [place] ?Get Driving Directions  ?[phone] ?[address] ?[address] ?[place], [place] [address] ?  ?[facility] ?Get Driving Directions ?[phone] ?[address] ?[place], [place] [address] ? ?Your MyChart E-visit questionnaire answers were reviewed by a board certified advanced clinical practitioner to complete your personal care plan based on your specific symptoms.  Thank you for using e-Visits. ?  ? ?I provided 5 minutes of non face-to-face time during this encounter for chart review and documentation.  ? ?

## 2021-05-13 NOTE — ED Triage Notes (Signed)
Pt states that she was eating food and feels she may have aspirated some food as began coughing x 5 days ago.  Patient is still coughing and has some hoarsness with her voice. ?

## 2021-05-13 NOTE — ED Provider Notes (Signed)
?[facility] ? ? ? ?CSN: [id number] ?Arrival date & time: 05/13/21  1648 ? ? ?  ? ?History   ?Chief Complaint ?Chief Complaint  ?Patient presents with  ? Cough  ?  Had e-visit with Clearnce Sorrel Purnette, PA-C - Acute cough and aspiration into airway. She requested I have an in person visit asap. The phone number listed if for my husband Fawna Cranmer who can take calls for me. - Entered by patient  ? Appointment  ? ? ?HPI ?Beverly Cole is a 61 y.o. female.  ? ?Patient presents today due to concerns for aspiration pneumonia.  Patient reports that she was eating a sandwich approximately 5 days ago and choked on it, so she is concerned that it went into her lungs.  Has been having associated nonproductive cough ever since this occurred.  Denies any associated fevers or shortness of breath.  Denies any feelings of foreign body in throat.  Denies history of asthma or COPD but patient does have a history of smoking.  Patient had video visit today and was advised to seek care in person for further evaluation. ? ? ?Cough ? ?Past Medical History:  ?Diagnosis Date  ? Anxiety   ? Atrial fibrillation ([place])   ? a. Event monitor 2013 - PACs/bradycardia/SVT/short run of atrial fib by event monitor.   ? BIPOLAR DISORDER UNSPECIFIED   ? Bradycardia   ? Bursitis of hip 09/1999  ? Bilateral - Dr. Berenice Primas  ? DEPRESSION   ? Emphysema lung ([place]) 06/20/2016  ? pt states pulmonalongist stated started recently  ? GERD   ? Headache(784.0)   ? was with menstrual cycle. no longer a problem  ? HYPERLIPIDEMIA   ? Hypertension   ? Hypertension   ? IRRITABLE BOWEL SYNDROME, HX OF   ? Menopausal state 01/2012  ? FSH = 88.5  ? Mitral valve prolapse 12/17/2000  ? a. dx 1980s, most recent echo did not demonstrate this.  ? MYALGIA   ? Normal coronary arteries   ? a. by cardiac CT 2013.  ? PAT (paroxysmal atrial tachycardia) ([place])   ? PE (pulmonary embolism) 02/09/2015  ? a. Bilateral PEs 01/2015 when d-dimer 0.69, CP lying on left side. Followed  by heme-onc - + lupus anticoagulant, mildly depressed protein S. Dr. Marin Olp is not certain if her hypercoagulable studies are significant for a thrombophilic state.  ? Premature atrial contractions   ? Pulmonary embolus ([place]) 02/10/2015  ? Shortness of breath   ? Pulmonary eval 05/2002  ? Thyroid nodule 2014  ? Tremors of nervous system   ? ? ?Patient Active Problem List  ? Diagnosis Date Noted  ? Protein S deficiency ([place]) 12/10/2020  ? Thyroid nodule 12/10/2020  ? Other fatigue 12/10/2020  ? COVID-19 virus infection 07/16/2020  ? RUQ pain 07/04/2019  ? Hyperlipidemia LDL goal <100 05/26/2017  ? Acute perforated appendicitis s/p lap appendectomy 03/17/2017 03/16/2017  ? History of pulmonary embolus - Dec 2017 03/16/2017  ? Lupus anticoagulant positive 03/16/2017  ? Irritable bowel syndrome (IBS) 03/16/2017  ? Anxiety and depression 03/16/2017  ? Need for shingles vaccine 11/25/2016  ? SOB (shortness of breath) 04/25/2016  ? Chronic saddle pulmonary embolism with acute cor pulmonale (HCC)   ? Chest pain 02/09/2015  ? Severe recurrent major depression without psychotic features ([place])   ? Goiter, nontoxic, multinodular 12/18/2014  ? Tremor 05/09/2012  ? SVT (supraventricular tachycardia) ([place]) 12/03/2011  ? Hypertension 08/26/2010  ? Memory loss 04/08/2010  ? Hyperlipidemia  03/13/2009  ? Bipolar 2 disorder ([place]) 03/13/2009  ? Hypothyroidism 07/06/2008  ? GERD 11/05/2006  ? ACNE NEC 11/05/2006  ? DEPRESSION 07/23/2006  ? ? ?Past Surgical History:  ?Procedure Laterality Date  ? COLONOSCOPY    ? LAPAROSCOPIC APPENDECTOMY N/A 03/17/2017  ? Procedure: APPENDECTOMY LAPAROSCOPIC;  Surgeon: Excell Seltzer, MD;  Location: [facility];  Service: General;  Laterality: N/A;  ? Lipoma (R) side  1990  ? Right back  ? ? ?OB History   ? ? Gravida  ?0  ? Para  ?0  ? Term  ?   ? Preterm  ?   ? AB  ?   ? Living  ?   ?  ? ? SAB  ?   ? IAB  ?   ? Ectopic  ?   ? Multiple  ?   ? Live Births  ?   ?   ?  ?  ? ? ? ?Home Medications   ? ?Prior to  Admission medications   ?Medication Sig Start Date End Date Taking? Authorizing Provider  ?ALPRAZolam (XANAX) 0.5 MG tablet TAKE 1 TABLET (0.5 MG TOTAL) BY MOUTH 2 (TWO) TIMES DAILY AS NEEDED FOR ANXIETY. TAKE 1 TABLET 30 MINUTES PRIOR TO PROCEDURE 01/17/21  Yes Cottle, Billey Co., MD  ?amoxicillin-clavulanate (AUGMENTIN) 875-125 MG tablet Take 1 tablet by mouth every 12 (twelve) hours. 05/13/21  Yes Oswaldo Conroy E, FNP  ?aspirin EC 325 MG tablet Take 325 mg by mouth daily.   Yes [provider]  ?buPROPion (WELLBUTRIN SR) 200 MG 12 hr tablet Take 1 tablet (200 mg total) by mouth every morning. 03/25/21  Yes Cottle, Billey Co., MD  ?dexlansoprazole (DEXILANT) 60 MG capsule Take 1 capsule (60 mg total) by mouth daily. 05/01/21  Yes Ann Held, DO  ?diltiazem (CARTIA XT) 180 MG 24 hr capsule Take 1 capsule (180 mg total) by mouth daily. 02/05/21  Yes Ann Held, DO  ?fexofenadine (ALLEGRA ALLERGY) 180 MG tablet Take 1 tablet (180 mg total) by mouth daily. 04/24/20  Yes Roma Schanz R, DO  ?fluticasone (FLONASE) 50 MCG/ACT nasal spray Place 2 sprays into the nose daily as needed. For allergies   Yes [provider]  ?gabapentin (NEURONTIN) 300 MG capsule Take 2-3 capsules (600-900 mg total) by mouth nightly for restless legs 03/28/21  Yes Cottle, Billey Co., MD  ?modafinil (PROVIGIL) 200 MG tablet Take 1.5 tablets (300 mg total) by mouth in the morning. ?Patient taking differently: Take 300 mg by mouth in the morning. 1 tab daily 03/25/21  Yes Cottle, Billey Co., MD  ?NONFORMULARY OR COMPOUNDED ITEM sm Azelaic acid/ metronidazole/ivermectin 15%/1%/1% cream apply to face bid 05/03/20  Yes Ann Held, DO  ?OLANZapine (ZYPREXA) 5 MG tablet Take 1 tablet (5 mg total) by mouth at bedtime. 03/28/21  Yes Cottle, Billey Co., MD  ?Probiotic Product (PROBIOTIC DAILY PO) Take 1 tablet by mouth daily.    Yes [provider]  ?propranolol (INDERAL) 20 MG tablet Take 1  tablet (20 mg total) by mouth 2 (two) times daily. 05/01/21  Yes Roma Schanz R, DO  ?sertraline (ZOLOFT) 50 MG tablet Take 1.5 tablets (75 mg total) by mouth daily. ?Patient taking differently: Take 50 mg by mouth daily. 03/25/21  Yes Cottle, Billey Co., MD  ?tretinoin (RETIN-A) 0.025 % cream Apply topically at bedtime. 01/17/21  Yes Roma Schanz R, DO  ?trimethoprim (TRIMPEX) 100 MG tablet  TAKE AFTER INTERCOURSE AS DIRECTED 01/17/21 01/17/22 Yes Roma Schanz R, DO  ?FLUoxetine (PROZAC) 20 MG capsule Take 1 capsule (20 mg total) by mouth daily. 01/10/20 04/24/20  Cottle, Billey Co., MD  ? ? ?Family History ?Family History  ?Problem Relation Age of Onset  ? Lung cancer Mother   ? Hypertension Mother   ? Hyperlipidemia Mother   ? Cancer Father   ?     Esophageal cancer  ? Hyperlipidemia Father   ? Depression Father   ? Cancer Brother   ? Pulmonary embolism Brother   ? Colon cancer Paternal Uncle   ? Sarcoidosis Other   ? Testicular cancer Other   ? ? ?Social History ?Social History  ? ?Tobacco Use  ? Smoking status: Former  ?  Packs/day: 1.00  ?  Years: 25.00  ?  Pack years: 25.00  ?  Types: Cigarettes  ?  Quit date: 02/11/1996  ?  Years since quitting: 25.2  ? Smokeless tobacco: Never  ? Tobacco comments:  ?  Married, lives with spouse. 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 Vaping Use: Never used  ?Substance Use Topics  ? Alcohol use: Yes  ?  Comment: occ  ? Drug use: No  ? ? ? ?Allergies   ?Cariprazine, Metoprolol, Nickel, and Iron ? ? ?Review of Systems ?Review of Systems ?Per HPI ? ?Physical Exam ?Triage Vital Signs ?ED Triage Vitals  ?Enc Vitals Group  ?   BP 05/13/21 1723 121/78  ?   Pulse Rate 05/13/21 1723 99  ?   Resp 05/13/21 1723 18  ?   Temp 05/13/21 1723 98 ?F (36.7 ?C)  ?   Temp Source 05/13/21 1723 Oral  ?   SpO2 05/13/21 1723 94 %  ?   Weight 05/13/21 1724 172 lb (78 kg)  ?   Height 05/13/21 1724 '5\' 9"'$  (1.753 m)  ?   Head Circumference --   ?   Peak Flow --   ?    Pain Score 05/13/21 1724 0  ?   Pain Loc --   ?   Pain Edu? --   ?   Excl. in [place]? --   ? ?No data found. ? ?Updated Vital Signs ?BP 121/78 (BP Location: Left Arm)   Pulse 99   Temp 98 ?F (36.7 ?C) (Oral)   Re

## 2021-05-13 NOTE — Discharge Instructions (Signed)
Your chest x-ray was normal.  You are being treated with antibiotic due to suspicion of aspiration pneumonia.  Please follow-up with primary care doctor for further evaluation and management. ?

## 2021-05-14 ENCOUNTER — Other Ambulatory Visit (HOSPITAL_COMMUNITY): Payer: Self-pay

## 2021-05-22 ENCOUNTER — Telehealth: Payer: Self-pay | Admitting: Psychiatry

## 2021-05-22 NOTE — Telephone Encounter (Signed)
Next appt is 06/26/21. Beverly Cole called and said that she has some questions about possibly increasing her Klonopin. Her phone number is [phone]. ?

## 2021-05-22 NOTE — Telephone Encounter (Signed)
LVM to [facility] 

## 2021-05-24 NOTE — Telephone Encounter (Signed)
LVM to [facility] 

## 2021-05-27 ENCOUNTER — Telehealth: Payer: Self-pay | Admitting: Psychiatry

## 2021-05-27 NOTE — Telephone Encounter (Signed)
PT called and wanted to get in sooner than 5/25. NO openings at this time. I've added her to wait list. She reqeusts to be able to increase her olanzepine at this time. She is taking 2 of the '5mg'$  tablets and it is not helping with depression any lnoger. She wants to see about going up to 3 of the '5mg'$ . ?

## 2021-05-27 NOTE — Telephone Encounter (Signed)
Patient is not taking Klonopin. She wants to increase olanzapine, addressed in another message.  ?

## 2021-05-28 NOTE — Telephone Encounter (Signed)
She can increase olanzapine to 15 mg at night.  The insurance may or may not cover 3 of the 5 mg tablets.  But if she has several of the 5 mg tablets she can take 3 of them until they are gone.  If she needs it we can send in a prescription for one of the 15 mg tablets. ?

## 2021-05-28 NOTE — Telephone Encounter (Signed)
LVM to [facility] 

## 2021-05-29 ENCOUNTER — Other Ambulatory Visit (HOSPITAL_COMMUNITY): Payer: Self-pay

## 2021-05-29 ENCOUNTER — Other Ambulatory Visit: Payer: Self-pay

## 2021-05-29 MED ORDER — OLANZAPINE 15 MG PO TABS
15.0000 mg | ORAL_TABLET | Freq: Every day | ORAL | 0 refills | Status: DC
  Filled 2021-05-29: qty 30, 30d supply, fill #0

## 2021-05-30 ENCOUNTER — Telehealth: Payer: Self-pay

## 2021-05-30 NOTE — Telephone Encounter (Signed)
Patient called for RF of Nail Fungus topical through [facility]. RX approved and sent in. aw ?

## 2021-06-06 ENCOUNTER — Ambulatory Visit (INDEPENDENT_AMBULATORY_CARE_PROVIDER_SITE_OTHER): Payer: 59 | Admitting: Psychology

## 2021-06-06 DIAGNOSIS — F331 Major depressive disorder, recurrent, moderate: Secondary | ICD-10-CM

## 2021-06-06 NOTE — Progress Notes (Signed)
[facility] Counselor/Therapist Progress Note ? ?Patient ID: Beverly Cole, MRN: [id number],   ? ?Date: 06/06/2021 ? ?Time Spent: 3:00pm-3:50pm   50 minutes  ? ?Treatment Type: Individual Therapy ? ?Reported Symptoms: worrying, low motivation ? ?Mental Status Exam: ?Appearance:  Casual     ?Behavior: Appropriate  ?Motor: Normal  ?Speech/Language:  Normal Rate  ?Affect: Appropriate  ?Mood: normal  ?Thought process: normal  ?Thought content:   WNL  ?Sensory/Perceptual disturbances:   WNL  ?Orientation: oriented to person, place, time/date, and situation  ?Attention: Good  ?Concentration: Good  ?Memory: WNL  ?Fund of knowledge:  Good  ?Insight:   Good  ?Judgment:  Good  ?Impulse Control: Good  ? ?Risk Assessment: ?Danger to Self:  No ?Self-injurious Behavior: No ?Danger to Others: No ?Duty to Warn:no ?Physical Aggression / Violence:No  ?Access to Firearms a concern: No  ?Gang Involvement:No  ? ?Subjective: Pt present for face-to-face therapy via Webex.  Pt consents to telehealth video session due to COVID-19 pandemic.     ?Location of pt: home.  ?Location of therapist: home office ?Pt reached out to her psychiatrist to address that she had not been feeling well.  He increased one of her medications. ?Pt is feeling a little better regarding energy and motivation but she is still having trouble getting things accomplished.  Worked with pt on goal setting and making the goals small enough to be doable and sustainable.   ?Provided supportive counseling.  ? ?Interventions: Cognitive Behavioral Therapy and Insight-Oriented ? ?Diagnosis: F33.1 ? ?Plan: See pt's Treatment Plan for depression in Therapy Charts.  (Treatment Plan Target Date: 10/25/2021) ?Pt is progressing toward treatment goals.   ?Plan to continue to see pt every month.  ? ?Alejandro Gamel Estrella Myrtle, LCSW ? ? ? ?

## 2021-06-26 ENCOUNTER — Encounter: Payer: Self-pay | Admitting: Psychiatry

## 2021-06-26 ENCOUNTER — Telehealth (INDEPENDENT_AMBULATORY_CARE_PROVIDER_SITE_OTHER): Payer: 59 | Admitting: Psychiatry

## 2021-06-26 ENCOUNTER — Other Ambulatory Visit (HOSPITAL_COMMUNITY): Payer: Self-pay

## 2021-06-26 DIAGNOSIS — F4001 Agoraphobia with panic disorder: Secondary | ICD-10-CM | POA: Diagnosis not present

## 2021-06-26 DIAGNOSIS — F411 Generalized anxiety disorder: Secondary | ICD-10-CM | POA: Diagnosis not present

## 2021-06-26 DIAGNOSIS — G2581 Restless legs syndrome: Secondary | ICD-10-CM

## 2021-06-26 DIAGNOSIS — F40240 Claustrophobia: Secondary | ICD-10-CM

## 2021-06-26 DIAGNOSIS — F331 Major depressive disorder, recurrent, moderate: Secondary | ICD-10-CM

## 2021-06-26 DIAGNOSIS — F423 Hoarding disorder: Secondary | ICD-10-CM | POA: Diagnosis not present

## 2021-06-26 MED ORDER — GABAPENTIN 300 MG PO CAPS
1200.0000 mg | ORAL_CAPSULE | Freq: Every day | ORAL | 0 refills | Status: DC
  Filled 2021-06-26: qty 360, 90d supply, fill #0

## 2021-06-26 MED ORDER — OLANZAPINE 20 MG PO TABS
20.0000 mg | ORAL_TABLET | Freq: Every day | ORAL | 1 refills | Status: DC
  Filled 2021-06-26: qty 30, 30d supply, fill #0
  Filled 2021-07-30: qty 30, 30d supply, fill #1

## 2021-06-26 NOTE — Progress Notes (Signed)
Beverly Cole ?[id number] ?[birth year] ?61 y.o. ? ?Video Visit via My Chart ? ?I connected with pt by My Chart and verified that I am speaking with the correct person using two identifiers. ?  ?I discussed the limitations, risks, security and privacy concerns of performing an evaluation and management service by My Chart  and the availability of in person appointments. I also discussed with the patient that there may be a patient responsible charge related to this service. The patient expressed understanding and agreed to proceed. ? ?I discussed the assessment and treatment plan with the patient. The patient was provided an opportunity to ask questions and all were answered. The patient agreed with the plan and demonstrated an understanding of the instructions. ?  ?The patient was advised to call back or seek an in-person evaluation if the symptoms worsen or if the condition fails to improve as anticipated. ? ?I provided 30 minutes of video time during this encounter.  The patient was located at home and the provider was located office. ?Session from 230 until 3 PM ? ?Subjective:  ? ?Patient ID:  Beverly Cole is a 61 y.o. (DOB [birth year]) female. ? ?Chief Complaint:  ?Chief Complaint  ?Patient presents with  ? Follow-up  ? Depression  ? Anxiety  ? Sleeping Problem  ? ? ?Depression ?       Associated symptoms include decreased concentration and fatigue.  Associated symptoms include no suicidal ideas.  Past medical history includes anxiety.   ?Anxiety ?Symptoms include decreased concentration, dizziness, nervous/anxious behavior and shortness of breath. Patient reports no chest pain, confusion or suicidal ideas.  ? ? ?Beverly Cole  today for follow-up of chronic depression and anxiety.  Lately depression worse than anxiety.  Seen with husband today ? ?When seen May 11, 2018.  For persistent anxiety we elected to retry buspirone and try increasing it to 30 mg twice daily if tolerated.   ?Couldn't tolerate it this  time DT muscle spasms.  Pharmacy called saying she could have serotonin syndrome. ? ?When seen August 09, 2018 and she refused med changes despite chronic anxiety and depression. ?She remained on sertraline 50, Depakote ER 1000 mg, Wellbutrin SR 200 mg AM ? ?Panic wearing cloth masks.  Using a shield.  Not in the office today bc H exposed to Covid.   ? ?seen December 09, 2018.  Because of chronic depression and dysfunction with fatigue and poor motivation we decided off label trial  trial of modafinil 200 mg 1/2 each am for 1 week then 200 mg each AM. ?Did see benefit from it.  More energy and working to stay out of bed more.   ? ?seen January 10, 2019.  The modafinil had been helpful.  No meds were changed. ? ?seen April 07, 2019.  The following was noted: ?Still sees benefit from modafinil but still not caring for herself like she should.  Not wanting to go anywhere.  Not driven since early fall.  Hard to breathe with the mask and it creates anxiety.  Hard to be in enclosed spaces like cars and planes for extended periods.   ?Trying to set her alarm clock.  No hallucinations.  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  Plans to send more too.  Still follow through is not great.  Still depression and productivity is still poor but it is not as poor as it was before modafinil. ?Plan  increase modafinil to 300 mg daily to see if function can be improved. ? ?06/29/2019 appointment, the following is noted: ?Rare Xanax.  Did increase modafinil to 300 mg daily. ?Saw benefit with the increase with less time in bed but still markedly functionally impaired.   ?It seems like I get used to it and then doesn't maintain her get up and go.  Still having a hard time with self care like hygiene. Chronic low motivation and lack of interest is unchanged.   ?Gall bladder problems.  It's better at the moment.   ? Not more sad, just like I've always been.  Can't make  herself leave the house DT depression and anxiety.  She won't do chores.   Less excess sleeping.  He works from home 2 days weekly.  No periods of hyperactivity or manic sx since the spring. ?Attending therapy every 2 weeks.   ?Can be confused when first wakes up regardless of time of day. ?Had episode of hallucination in the middle of the night when awakened. Scarecrow frightened her. This is rare event.  No hallucinations during the day.  Melatonin makes it worse. ?Has had fearful thoughts of planes crashing or bad things happening to others and it might be her fault.  ?Fight with brother lately and not speaking with him now. ?Plan:  No med changes ? ?10/11/19 appt with the following noted: ?Unsteady and tremors gotten worse.  Not gone to doctor. ?Propranolol not helping as much.   ?On Depakote ER '1000mg'$  HS which is a reduction. ?So tired all the time and H says she's not doing anything.  H says accomplishing littte and still lays in bed a lot.  H says it's 2-3 PM before she gets OOB.  Memory is poor.  Everything is such a chore and doesn't want to do things. ?H says she starts things she doesn't finish.  Lots of new projects.   ?Increase modafinil on her own to 400 mg daily in the AM.  Anxious.  Rare Xanax.  ?Was happy when got new cats in the summer.  Anxious and Depressed and describes anxiety as Moderate. Anxiety symptoms include: Excessive Worry,.  She thinks sertraline reduces her obsessiveness.  Past hx panic so avoidant. Never get to relax.  Pt reports sleeps excessively even with modafinil. Pt reports that appetite is good. Pt reports that energy is poor and loss of interest or pleasure in usual activities, poor motivation and withdrawn from usual activities. Just don't care about things and admits to a lot of general anxiety and everything takes a lot of energy out of her.  Compulsively tapes and watches TV shows, news and awards shows.  Can't delete it bc I'll miss something.  Watches TV in bed.  Concentration is poor. Suicidal thoughts:  denied by patient. But chronic death thoughts.  Poor productivity overall.  Chronic poor self care, showers only weekly.  Just don't care.  Chronic disability.  She thinks sertraline helped the anxiety some.  Likes that H is at home.  Helps her mood.  Doesn't care about going out. ?Feels faint when she tries to wear a mask.  Too anxious driving and is avoidant. ?Collects TV shows, she can't erase unless she watches entirely. Hoards some bc afraid she might need it some day.   ?Rare use of Xanax bc fears addiction. ?Plan: Attempted referral to neurology at [facility] neurology.  They refused to see patient stating they had nothing to offer her. ?It was suggested to patient that  she try to get in with Dr. Jannifer Franklin whom she had seen in the past. ? ?01/10/2020 appointment with the following noted: ?Reduced modafinil to 300 mg bc didn't want to get hooked on anything about 2-3 weeks.  Did initially see benefit from the modafinil but seemed to get tolerant to it. ?Feels better not talking to brother bc differing views of politics.  Feels he's a negative person but never depressed. ?Overall thinks she's doing pretty well relatively.  However is chronically depressed and never happy.  Nothing changed with the meds. Depression worse than anxiety. ?H didn't think modafinil made much difference.  H CO her inactivity.. She felt it was helpful for energy initially. ?Had to cancel trips bc can't wear a mask for that long.  Makes her sad. ?Driven twice in a year or so DT anxiety. ?Wants prn Xanax. ?Plan: DC sertraline ?Start fluoxetine 20 mg daily with olanzapine 5 mg daily. ? ?05/22/2020 appointment with following noted: ?Didn't remember to stop sertraline and took fluoxetine and olanzapine for 2 days and couldn't eat and stopped it. ?Then went to dermatologist December who told her couldn't take Zoloft with fungus med and stopped sertraline. ?Then became more depressed.  I have to go back on  the Zoloft. ?Remained on modafinil, Wellbutrin, Depakote.  ?Even less motivated to do anything like cook, clean.  Won't go to [employer]. ?Chronic depression with hopelessness to get better ?Plan: No med changes

## 2021-07-01 ENCOUNTER — Other Ambulatory Visit: Payer: Self-pay | Admitting: Psychiatry

## 2021-07-01 DIAGNOSIS — F4001 Agoraphobia with panic disorder: Secondary | ICD-10-CM

## 2021-07-01 DIAGNOSIS — F411 Generalized anxiety disorder: Secondary | ICD-10-CM

## 2021-07-01 DIAGNOSIS — F314 Bipolar disorder, current episode depressed, severe, without psychotic features: Secondary | ICD-10-CM

## 2021-07-02 ENCOUNTER — Ambulatory Visit (INDEPENDENT_AMBULATORY_CARE_PROVIDER_SITE_OTHER): Payer: 59 | Admitting: Psychology

## 2021-07-02 ENCOUNTER — Other Ambulatory Visit (HOSPITAL_COMMUNITY): Payer: Self-pay

## 2021-07-02 DIAGNOSIS — F331 Major depressive disorder, recurrent, moderate: Secondary | ICD-10-CM | POA: Diagnosis not present

## 2021-07-02 MED ORDER — SERTRALINE HCL 50 MG PO TABS
75.0000 mg | ORAL_TABLET | Freq: Every day | ORAL | 0 refills | Status: DC
  Filled 2021-07-02: qty 135, 90d supply, fill #0

## 2021-07-02 MED ORDER — MODAFINIL 200 MG PO TABS
300.0000 mg | ORAL_TABLET | Freq: Every morning | ORAL | 0 refills | Status: DC
  Filled 2021-07-02: qty 135, 90d supply, fill #0

## 2021-07-02 MED ORDER — BUPROPION HCL ER (SR) 200 MG PO TB12
200.0000 mg | ORAL_TABLET | Freq: Every morning | ORAL | 0 refills | Status: DC
  Filled 2021-07-02: qty 90, 90d supply, fill #0

## 2021-07-02 NOTE — Progress Notes (Signed)
[facility] Counselor/Therapist Progress Note  Patient ID: KARRA PINK, MRN: [id number],    Date: 07/02/2021  Time Spent: 3:00pm-3:50pm   50 minutes   Treatment Type: Individual Therapy  Reported Symptoms: worrying, low motivation  Mental Status Exam: Appearance:  Casual     Behavior: Appropriate  Motor: Normal  Speech/Language:  Normal Rate  Affect: Appropriate  Mood: normal  Thought process: normal  Thought content:   WNL  Sensory/Perceptual disturbances:   WNL  Orientation: oriented to person, place, time/date, and situation  Attention: Good  Concentration: Good  Memory: WNL  Fund of knowledge:  Good  Insight:   Good  Judgment:  Good  Impulse Control: Good   Risk Assessment: Danger to Self:  No Self-injurious Behavior: No Danger to Others: No Duty to Warn:no Physical Aggression / Violence:No  Access to Firearms a concern: No  Gang Involvement:No   Subjective: Pt present for face-to-face therapy via Webex.  Pt consents to telehealth video session due to COVID-19 pandemic.     Location of pt: home.  Location of therapist: home office Pt talked about her niece graduating from pharmacy school and getting a residency at [employer].   Pt attended the graduation and saw her brother.  She states he "acted normal" and was glad that things went well regarding their interactions.   Pt had an appointment with Dr. Clovis Pu who increased her psychiatric medication.   Pt has been feeling somewhat better since her trip to [place] for her niece's graduation.   She feels like she needs another trip to look forward to in order to sustain her mood.   Worked on coping strategies.   Pt talked about thinking about death.  She thinks more about it as she ages and has health limitations.  Helped pt process her thoughts and feelings.   Provided supportive counseling.   Interventions: Cognitive Behavioral Therapy and Insight-Oriented  Diagnosis: F33.1  Plan: See pt's Treatment Plan  for depression in Therapy Charts.  (Treatment Plan Target Date: 10/25/2021) Pt is progressing toward treatment goals.   Plan to continue to see pt every month.   Vali Capano, LCSW

## 2021-07-03 ENCOUNTER — Other Ambulatory Visit (HOSPITAL_COMMUNITY): Payer: Self-pay

## 2021-07-10 NOTE — Progress Notes (Signed)
61 y.o. G0P0 Married White or Caucasian Not Hispanic or Latino female here for annual exam. Patient is requesting a pap today.   She is sexually active, using lubricant which helps but it still hurts. Previously helped with lidocaine.   H/O PE on HRT, has anticardiolipin antibodies and protein S Deficiency.   She c/o intermittent in her lower abdomen, occurs ~3-4 months, doesn't last for long. Moderate in intensity.  She is on prebiotics and probiotics and is not having normal BM's.  No urinary c/o.     Patient's last menstrual period was 05/11/2012.          Sexually active: Yes.    The current method of family planning is post menopausal status.    Exercising: No.  The patient does not participate in regular exercise at present. Smoker:  no  Health Maintenance: Pap:  01/20/19 WNL; 07/02/2017 WNL, 06-17-16 Neg, 06-05-15 Neg:Neg HR HPV History of abnormal Pap:  yes HX Cryo over 30 years ago.  MMG:  11/19/20 density C Bi-rads 1 neg  BMD:   08/04/19 osteopenic with primary Colonoscopy: 10/19/15 Normal f/u 10 years  TDaP:  05/13/11  Gardasil: n/a   reports that she quit smoking about 25 years ago. Her smoking use included cigarettes. She has a 25.00 pack-year smoking history. She has never used smokeless tobacco. She reports current alcohol use. She reports that she does not use drugs. No ETOH. On disability.  Past Medical History:  Diagnosis Date   Anxiety    Atrial fibrillation ([place])    a. Event monitor 2013 - PACs/bradycardia/SVT/short run of atrial fib by event monitor.    BIPOLAR DISORDER UNSPECIFIED    Bradycardia    Bursitis of hip 09/1999   Bilateral - Dr. Berenice Primas   DEPRESSION    Emphysema lung ([place]) 06/20/2016   pt states pulmonalongist stated started recently   GERD    Headache(784.0)    was with menstrual cycle. no longer a problem   HYPERLIPIDEMIA    Hypertension    Hypertension    IRRITABLE BOWEL SYNDROME, HX OF    Menopausal state 01/2012   [facility] = 88.5   Mitral valve  prolapse 12/17/2000   a. dx 1980s, most recent echo did not demonstrate this.   MYALGIA    Normal coronary arteries    a. by cardiac CT 2013.   PAT (paroxysmal atrial tachycardia) (HCC)    PE (pulmonary embolism) 02/09/2015   a. Bilateral PEs 01/2015 when d-dimer 0.69, CP lying on left side. Followed by heme-onc - + lupus anticoagulant, mildly depressed protein S. Dr. Marin Olp is not certain if her hypercoagulable studies are significant for a thrombophilic state.   Premature atrial contractions    Pulmonary embolus ([place]) 02/10/2015   Shortness of breath    Pulmonary eval 05/2002   Thyroid nodule 2014   Tremors of nervous system     Past Surgical History:  Procedure Laterality Date   COLONOSCOPY     LAPAROSCOPIC APPENDECTOMY N/A 03/17/2017   Procedure: APPENDECTOMY LAPAROSCOPIC;  Surgeon: Excell Seltzer, MD;  Location: [facility];  Service: General;  Laterality: N/A;   Lipoma (R) side  1990   Right back    Current Outpatient Medications  Medication Sig Dispense Refill   ALPRAZolam (XANAX) 0.5 MG tablet TAKE 1 TABLET (0.5 MG TOTAL) BY MOUTH 2 (TWO) TIMES DAILY AS NEEDED FOR ANXIETY. TAKE 1 TABLET 30 MINUTES PRIOR TO PROCEDURE 30 tablet 0   amoxicillin-clavulanate (AUGMENTIN) 875-125 MG tablet Take 1 tablet by  mouth every 12 (twelve) hours. 14 tablet 0   aspirin EC 325 MG tablet Take 325 mg by mouth daily.     buPROPion (WELLBUTRIN SR) 200 MG 12 hr tablet Take 1 tablet (200 mg total) by mouth every morning. 90 tablet 0   dexlansoprazole (DEXILANT) 60 MG capsule Take 1 capsule (60 mg total) by mouth daily. 90 capsule 1   diltiazem (CARTIA XT) 180 MG 24 hr capsule Take 1 capsule (180 mg total) by mouth daily. 90 capsule 1   fexofenadine (ALLEGRA ALLERGY) 180 MG tablet Take 1 tablet (180 mg total) by mouth daily.     fluticasone (FLONASE) 50 MCG/ACT nasal spray Place 2 sprays into the nose daily as needed. For allergies     gabapentin (NEURONTIN) 300 MG capsule Take 4 capsules (1,200 mg  total) by mouth daily. 360 capsule 0   modafinil (PROVIGIL) 200 MG tablet Take 1.5 tablets (300 mg total) by mouth in the morning. 135 tablet 0   NONFORMULARY OR COMPOUNDED ITEM sm Azelaic acid/ metronidazole/ivermectin 15%/1%/1% cream apply to face bid 1 each 5   OLANZapine (ZYPREXA) 20 MG tablet Take 1 tablet (20 mg total) by mouth at bedtime. 30 tablet 1   Probiotic Product (PROBIOTIC DAILY PO) Take 1 tablet by mouth daily.      propranolol (INDERAL) 20 MG tablet Take 1 tablet (20 mg total) by mouth 2 (two) times daily. 180 tablet 1   sertraline (ZOLOFT) 50 MG tablet Take 1.5 tablets (75 mg total) by mouth daily. 135 tablet 0   tretinoin (RETIN-A) 0.025 % cream Apply topically at bedtime. 45 g 4   trimethoprim (TRIMPEX) 100 MG tablet TAKE AFTER INTERCOURSE AS DIRECTED 30 tablet 2   No current facility-administered medications for this visit.    Family History  Problem Relation Age of Onset   Lung cancer Mother    Hypertension Mother    Hyperlipidemia Mother    Cancer Father        Esophageal cancer   Hyperlipidemia Father    Depression Father    Cancer Brother    Pulmonary embolism Brother    Colon cancer Paternal Uncle    Sarcoidosis Other    Testicular cancer Other   MAunt colon cancer at 38 PUncle colon cancer at 31  Review of Systems  All other systems reviewed and are negative.  Exam:   LMP 05/11/2012   Weight change: '@WEIGHTCHANGE'$ @ Height:      Ht Readings from Last 3 Encounters:  05/13/21 '5\' 9"'$  (1.753 m)  04/25/21 '5\' 9"'$  (1.753 m)  02/21/21 '5\' 9"'$  (1.753 m)    General appearance: alert, cooperative and appears stated age Head: Normocephalic, without obvious abnormality, atraumatic Neck: no adenopathy, supple, symmetrical, trachea midline and thyroid normal to inspection and palpation Lungs: clear to auscultation bilaterally Cardiovascular: regular rate and rhythm Breasts: normal appearance, no masses or tenderness Abdomen: soft, non-tender; non distended,  no  masses,  no organomegaly Extremities: extremities normal, atraumatic, no cyanosis or edema Skin: Skin color, texture, turgor normal. No rashes or lesions Lymph nodes: Cervical, supraclavicular, and axillary nodes normal. No abnormal inguinal nodes palpated Neurologic: Grossly normal   Pelvic: External genitalia:  no lesions              Urethra:  normal appearing urethra with no masses, tenderness or lesions              Bartholins and Skenes: normal  Vagina: atrophic appearing vagina with normal color and discharge, no lesions              Cervix: no lesions               Bimanual Exam:  Uterus:  normal size, contour, position, consistency, mobility, non-tender              Adnexa: no mass, fullness, tenderness               Rectovaginal: Confirms               Anus:  normal sphincter tone, no lesions  Gae Dry chaperoned for the exam.  1. Well woman exam Discussed breast self exam Discussed calcium and vit D intake Mammogram UTD Colonoscopy was done in 2017, she will check with Dr Collene Mares when she is due again DEXA and labs with primary  2. Screening for cervical cancer - Cytology - PAP  3. Introital dyspareunia H/O PE, not good candidate for estrogen use Use a lubricant, samples of uberlube given - lidocaine (XYLOCAINE) 5 % ointment; Apply a pea sized amount topically 20 minutes prior to intercourse, wipe off just prior to intercourse.  Dispense: 30 g; Refill: 1  4. Immunization due - Tdap vaccine greater than or equal to 7yo IM

## 2021-07-12 ENCOUNTER — Encounter: Payer: Self-pay | Admitting: Obstetrics and Gynecology

## 2021-07-16 ENCOUNTER — Encounter: Payer: Self-pay | Admitting: Family Medicine

## 2021-07-17 ENCOUNTER — Encounter: Payer: Self-pay | Admitting: Obstetrics and Gynecology

## 2021-07-17 ENCOUNTER — Other Ambulatory Visit (HOSPITAL_COMMUNITY)
Admission: RE | Admit: 2021-07-17 | Discharge: 2021-07-17 | Disposition: A | Discharge status: Home / Self Care | Payer: 59 | Source: Ambulatory Visit | Attending: Obstetrics and Gynecology | Admitting: Obstetrics and Gynecology

## 2021-07-17 ENCOUNTER — Other Ambulatory Visit (HOSPITAL_COMMUNITY): Payer: Self-pay

## 2021-07-17 ENCOUNTER — Ambulatory Visit (INDEPENDENT_AMBULATORY_CARE_PROVIDER_SITE_OTHER): Payer: 59 | Admitting: Obstetrics and Gynecology

## 2021-07-17 VITALS — BP 122/74 | HR 77 | Ht 69.0 in | Wt 178.0 lb

## 2021-07-17 DIAGNOSIS — Z124 Encounter for screening for malignant neoplasm of cervix: Secondary | ICD-10-CM | POA: Insufficient documentation

## 2021-07-17 DIAGNOSIS — K5730 Diverticulosis of large intestine without perforation or abscess without bleeding: Secondary | ICD-10-CM | POA: Insufficient documentation

## 2021-07-17 DIAGNOSIS — N9411 Superficial (introital) dyspareunia: Secondary | ICD-10-CM

## 2021-07-17 DIAGNOSIS — Z01419 Encounter for gynecological examination (general) (routine) without abnormal findings: Secondary | ICD-10-CM | POA: Diagnosis not present

## 2021-07-17 DIAGNOSIS — Z23 Encounter for immunization: Secondary | ICD-10-CM | POA: Diagnosis not present

## 2021-07-17 DIAGNOSIS — Z800 Family history of malignant neoplasm of digestive organs: Secondary | ICD-10-CM | POA: Insufficient documentation

## 2021-07-17 DIAGNOSIS — R1310 Dysphagia, unspecified: Secondary | ICD-10-CM | POA: Insufficient documentation

## 2021-07-17 DIAGNOSIS — R194 Change in bowel habit: Secondary | ICD-10-CM | POA: Insufficient documentation

## 2021-07-17 DIAGNOSIS — Z1211 Encounter for screening for malignant neoplasm of colon: Secondary | ICD-10-CM | POA: Insufficient documentation

## 2021-07-17 DIAGNOSIS — Z0000 Encounter for general adult medical examination without abnormal findings: Secondary | ICD-10-CM | POA: Insufficient documentation

## 2021-07-17 DIAGNOSIS — K641 Second degree hemorrhoids: Secondary | ICD-10-CM | POA: Insufficient documentation

## 2021-07-17 DIAGNOSIS — R635 Abnormal weight gain: Secondary | ICD-10-CM | POA: Insufficient documentation

## 2021-07-17 MED ORDER — LIDOCAINE 5 % EX OINT
TOPICAL_OINTMENT | CUTANEOUS | 1 refills | Status: DC
  Filled 2021-07-17: qty 30, 30d supply, fill #0
  Filled 2021-10-08 – 2021-10-22 (×2): qty 30, 30d supply, fill #1

## 2021-07-17 NOTE — Patient Instructions (Signed)

## 2021-07-18 LAB — CYTOLOGY - PAP
Comment: NEGATIVE
Diagnosis: NEGATIVE
High risk HPV: NEGATIVE

## 2021-07-19 ENCOUNTER — Other Ambulatory Visit (HOSPITAL_COMMUNITY): Payer: Self-pay

## 2021-07-30 ENCOUNTER — Other Ambulatory Visit (INDEPENDENT_AMBULATORY_CARE_PROVIDER_SITE_OTHER): Payer: 59

## 2021-07-30 ENCOUNTER — Ambulatory Visit (INDEPENDENT_AMBULATORY_CARE_PROVIDER_SITE_OTHER): Payer: 59 | Admitting: Psychology

## 2021-07-30 ENCOUNTER — Telehealth: Payer: Self-pay

## 2021-07-30 ENCOUNTER — Other Ambulatory Visit: Payer: Self-pay | Admitting: Family Medicine

## 2021-07-30 DIAGNOSIS — Z1211 Encounter for screening for malignant neoplasm of colon: Secondary | ICD-10-CM

## 2021-07-30 DIAGNOSIS — F331 Major depressive disorder, recurrent, moderate: Secondary | ICD-10-CM | POA: Diagnosis not present

## 2021-07-30 DIAGNOSIS — R195 Other fecal abnormalities: Secondary | ICD-10-CM

## 2021-07-30 LAB — FECAL OCCULT BLOOD, IMMUNOCHEMICAL: Fecal Occult Bld: POSITIVE — AB

## 2021-07-30 NOTE — Telephone Encounter (Signed)
Positive Ifob   Caller: Marty Heck  Receiver: Manuela Schwartz, RMA  Date and time:  07/30/2021 at 3:52 pm

## 2021-07-30 NOTE — Progress Notes (Signed)
[facility] Counselor/Therapist Progress Note  Patient ID: Beverly Cole, MRN: [id number],    Date: 07/30/2021  Time Spent: 3:00pm-3:50pm   50 minutes   Treatment Type: Individual Therapy  Reported Symptoms: worrying, low motivation  Mental Status Exam: Appearance:  Casual     Behavior: Appropriate  Motor: Normal  Speech/Language:  Normal Rate  Affect: Appropriate  Mood: normal  Thought process: normal  Thought content:   WNL  Sensory/Perceptual disturbances:   WNL  Orientation: oriented to person, place, time/date, and situation  Attention: Good  Concentration: Good  Memory: WNL  Fund of knowledge:  Good  Insight:   Good  Judgment:  Good  Impulse Control: Good   Risk Assessment: Danger to Self:  No Self-injurious Behavior: No Danger to Others: No Duty to Warn:no Physical Aggression / Violence:No  Access to Firearms a concern: No  Gang Involvement:No   Subjective: Pt present for face-to-face therapy via Webex.  Pt consents to telehealth video session due to COVID-19 pandemic.     Location of pt: home.  Location of therapist: home office Pt talked about traveling to her niece's graduation and seeing her brother.  Pt had not talked to her brother in a long time.  The interaction went much better than pt expected.  Pt states her [name] told her she never looked as bad as she looked the day of the graduation bc she was so tired.  He was worried about pt's health but she looked better after she had a nap.    Pt talked about [name]'s job.  [name] is doing very well at work and seems happier there.  Pt is relieved that [name] is happier with his job.  Pt states her mood has been good recently.   Provided supportive counseling.   Interventions: Cognitive Behavioral Therapy and Insight-Oriented  Diagnosis: F33.1  Plan: See pt's Treatment Plan for depression in Therapy Charts.  (Treatment Plan Target Date: 10/25/2021) Pt is progressing toward treatment goals.    Plan to continue to see pt every month.   Voncile Schwarz, LCSW

## 2021-07-31 ENCOUNTER — Other Ambulatory Visit (HOSPITAL_COMMUNITY): Payer: Self-pay

## 2021-07-31 ENCOUNTER — Other Ambulatory Visit: Payer: Self-pay

## 2021-07-31 DIAGNOSIS — K921 Melena: Secondary | ICD-10-CM

## 2021-08-01 ENCOUNTER — Encounter: Payer: Self-pay | Admitting: Family Medicine

## 2021-08-03 ENCOUNTER — Other Ambulatory Visit: Payer: Self-pay | Admitting: Family Medicine

## 2021-08-03 DIAGNOSIS — I10 Essential (primary) hypertension: Secondary | ICD-10-CM

## 2021-08-05 ENCOUNTER — Other Ambulatory Visit (HOSPITAL_COMMUNITY): Payer: Self-pay

## 2021-08-05 MED ORDER — DILTIAZEM HCL ER COATED BEADS 180 MG PO CP24
180.0000 mg | ORAL_CAPSULE | Freq: Every day | ORAL | 1 refills | Status: DC
  Filled 2021-08-05: qty 30, 30d supply, fill #0
  Filled 2021-08-05: qty 60, 60d supply, fill #0
  Filled 2021-10-22: qty 90, 90d supply, fill #1

## 2021-08-06 ENCOUNTER — Telehealth: Payer: Self-pay | Admitting: Obstetrics and Gynecology

## 2021-08-06 NOTE — Telephone Encounter (Signed)
Mychart message sent.

## 2021-08-07 ENCOUNTER — Telehealth: Payer: Self-pay | Admitting: Gastroenterology

## 2021-08-07 NOTE — Telephone Encounter (Signed)
Did she specifically request me or is this a general transfer request? If a general request please send to the supervising doc.  I don't understand the reason for wanting transfer closer to [facility], why? Dr. Lorie Apley office is in [place] and only 3.7 miles from [facility].  At least some of Dr. Lorie Apley procedure records are in [place] however her office records are not in [place] or in [place].

## 2021-08-07 NOTE — Telephone Encounter (Signed)
Dr. Fuller Plan,  We received a referral for patient to be evaluated for blood in stool.  The patient is requesting a transfer of care due to location wants to be closer to [place] patient has GI history with Dr. Collene Mares records are available through [facility] for you to review and advise on scheduling.     Thank you

## 2021-08-09 NOTE — Telephone Encounter (Signed)
OK to transfer her care back to me. We need office records from Dr. Lorie Apley office.

## 2021-08-09 NOTE — Telephone Encounter (Signed)
Spoke with patient she is requesting you as her provider since you saw her a few years back. She said they do not have MyChart and would like to continue her GI care with you. Will get all her records sent from Dr. Lorie Apley office for you.

## 2021-08-15 DIAGNOSIS — R197 Diarrhea, unspecified: Secondary | ICD-10-CM | POA: Diagnosis not present

## 2021-08-15 DIAGNOSIS — K219 Gastro-esophageal reflux disease without esophagitis: Secondary | ICD-10-CM | POA: Diagnosis not present

## 2021-08-15 DIAGNOSIS — K5730 Diverticulosis of large intestine without perforation or abscess without bleeding: Secondary | ICD-10-CM | POA: Diagnosis not present

## 2021-08-15 DIAGNOSIS — R195 Other fecal abnormalities: Secondary | ICD-10-CM | POA: Diagnosis not present

## 2021-08-15 NOTE — Telephone Encounter (Signed)
Called patient to schedule and she said she decided to stay with Dr. Lorie Apley office.

## 2021-08-20 ENCOUNTER — Other Ambulatory Visit (HOSPITAL_COMMUNITY): Payer: Self-pay

## 2021-08-20 MED ORDER — CLENPIQ 10-3.5-12 MG-GM -GM/175ML PO SOLN
ORAL | 0 refills | Status: DC
  Filled 2021-08-20: qty 350, 1d supply, fill #0

## 2021-08-22 ENCOUNTER — Encounter: Payer: Self-pay | Admitting: Dermatology

## 2021-08-22 ENCOUNTER — Telehealth: Payer: Self-pay

## 2021-08-22 NOTE — Telephone Encounter (Signed)
   Pre-operative Risk Assessment    Patient Name: Beverly Cole  DOB: 1960/06/04 MRN: [id number]      Request for Surgical Clearance    Procedure:   Endoscopy and Colonoscopy  Date of Surgery:  Clearance 08/28/21                                 Surgeon:  Dr. Johnney Killian Group or Practice Name:  [facility], [place] Phone number:  [phone] Fax number:  [phone]   Type of Clearance Requested:   - Medical    Type of Anesthesia:   Propofol   Additional requests/questions:   Pt takes ASA  Signed, Zenab Gronewold   08/22/2021, 4:55 PM

## 2021-08-22 NOTE — Telephone Encounter (Signed)
   Name: Beverly Cole  DOB: September 26, 1960  MRN: [id number]  Primary Cardiologist: Lauree Chandler, MD  Chart reviewed as part of pre-operative protocol coverage. Because of Beverly Cole's past medical history and time since last visit, she will require a follow-up tele- visit in order to better assess preoperative cardiovascular risk.  Pre-op covering staff: - Please schedule appointment and call patient to inform them. If patient already had an upcoming appointment within acceptable timeframe, please add "pre-op clearance" to the appointment notes so provider is aware. - Please contact requesting surgeon's office via preferred method (i.e, phone, fax) to inform them of need for appointment prior to surgery.  Since her procedure is a colonoscopy without biopsies this is considered a low risk procedure and ASA should be continued if possible.  Elgie Collard, PA-C  08/22/2021, 5:12 PM

## 2021-08-22 NOTE — Telephone Encounter (Signed)
Left message for the pt to call the office ASAP tomorrow as we need to schedule a  tele visit for pre op. Pt will need to be double booked on the schedule as we are currently full.

## 2021-08-22 NOTE — Telephone Encounter (Signed)
I will send FYI to requesting office that we just received the clearance request today and the pt is going to need a tele appt before she can be cleared.

## 2021-08-23 ENCOUNTER — Telehealth: Payer: Self-pay | Admitting: *Deleted

## 2021-08-23 NOTE — Telephone Encounter (Signed)
I left message x 2 for the pt to call for teel visit for pre op clearance. Per pre op provider adding her on to the Monday pre op schedule is fine. Per pre op provider, even though the pt is on ASA, this is generally not held for EGD/Colonoscopy. Monday pre op schedule is full and will need to be double booked as to not delay the pt's procedure.

## 2021-08-23 NOTE — Telephone Encounter (Signed)
Pt called back and she is agreeable to plan of care for tele visit 08/26/21 @ 10:50 add-on. Med rec and consent are done.   I will update the requesting office the pt has televisit 08/26/21 @ 10:50.

## 2021-08-23 NOTE — Telephone Encounter (Signed)
Pt called back and she is agreeable to plan of care for tele visit 08/26/21 @ 10:50 add-on. Med rec and consent are done.     Patient Consent for Virtual Visit        Beverly Cole has provided verbal consent on 08/23/2021 for a virtual visit (video or telephone).   CONSENT FOR VIRTUAL VISIT FOR:  Beverly Cole  By participating in this virtual visit I agree to the following:  I hereby voluntarily request, consent and authorize [facility] and its employed or contracted physicians, physician assistants, nurse practitioners or other licensed health care professionals (the Practitioner), to provide me with telemedicine health care services (the "Services") as deemed necessary by the treating Practitioner. I acknowledge and consent to receive the Services by the Practitioner via telemedicine. I understand that the telemedicine visit will involve communicating with the Practitioner through live audiovisual communication technology and the disclosure of certain medical information by electronic transmission. I acknowledge that I have been given the opportunity to request an in-person assessment or other available alternative prior to the telemedicine visit and am voluntarily participating in the telemedicine visit.  I understand that I have the right to withhold or withdraw my consent to the use of telemedicine in the course of my care at any time, without affecting my right to future care or treatment, and that the Practitioner or I may terminate the telemedicine visit at any time. I understand that I have the right to inspect all information obtained and/or recorded in the course of the telemedicine visit and may receive copies of available information for a reasonable fee.  I understand that some of the potential risks of receiving the Services via telemedicine include:  Delay or interruption in medical evaluation due to technological equipment failure or disruption; Information transmitted may not  be sufficient (e.g. poor resolution of images) to allow for appropriate medical decision making by the Practitioner; and/or  In rare instances, security protocols could fail, causing a breach of personal health information.  Furthermore, I acknowledge that it is my responsibility to provide information about my medical history, conditions and care that is complete and accurate to the best of my ability. I acknowledge that Practitioner's advice, recommendations, and/or decision may be based on factors not within their control, such as incomplete or inaccurate data provided by me or distortions of diagnostic images or specimens that may result from electronic transmissions. I understand that the practice of medicine is not an exact science and that Practitioner makes no warranties or guarantees regarding treatment outcomes. I acknowledge that a copy of this consent can be made available to me via my patient portal ([place]), or I can request a printed copy by calling the office of [facility].    I understand that my insurance will be billed for this visit.   I have read or had this consent read to me. I understand the contents of this consent, which adequately explains the benefits and risks of the Services being provided via telemedicine.  I have been provided ample opportunity to ask questions regarding this consent and the Services and have had my questions answered to my satisfaction. I give my informed consent for the services to be provided through the use of telemedicine in my medical care

## 2021-08-26 ENCOUNTER — Encounter: Payer: Self-pay | Admitting: Psychiatry

## 2021-08-26 ENCOUNTER — Telehealth (INDEPENDENT_AMBULATORY_CARE_PROVIDER_SITE_OTHER): Payer: 59 | Admitting: Psychiatry

## 2021-08-26 ENCOUNTER — Ambulatory Visit (INDEPENDENT_AMBULATORY_CARE_PROVIDER_SITE_OTHER): Payer: 59 | Admitting: Physician Assistant

## 2021-08-26 ENCOUNTER — Other Ambulatory Visit (HOSPITAL_COMMUNITY): Payer: Self-pay

## 2021-08-26 DIAGNOSIS — G2581 Restless legs syndrome: Secondary | ICD-10-CM | POA: Diagnosis not present

## 2021-08-26 DIAGNOSIS — G250 Essential tremor: Secondary | ICD-10-CM

## 2021-08-26 DIAGNOSIS — F411 Generalized anxiety disorder: Secondary | ICD-10-CM

## 2021-08-26 DIAGNOSIS — F423 Hoarding disorder: Secondary | ICD-10-CM | POA: Diagnosis not present

## 2021-08-26 DIAGNOSIS — F40240 Claustrophobia: Secondary | ICD-10-CM

## 2021-08-26 DIAGNOSIS — F331 Major depressive disorder, recurrent, moderate: Secondary | ICD-10-CM | POA: Diagnosis not present

## 2021-08-26 DIAGNOSIS — Z01810 Encounter for preprocedural cardiovascular examination: Secondary | ICD-10-CM | POA: Diagnosis not present

## 2021-08-26 DIAGNOSIS — F4001 Agoraphobia with panic disorder: Secondary | ICD-10-CM | POA: Diagnosis not present

## 2021-08-26 DIAGNOSIS — F4000 Agoraphobia, unspecified: Secondary | ICD-10-CM

## 2021-08-26 DIAGNOSIS — F314 Bipolar disorder, current episode depressed, severe, without psychotic features: Secondary | ICD-10-CM

## 2021-08-26 MED ORDER — GABAPENTIN 300 MG PO CAPS
1200.0000 mg | ORAL_CAPSULE | Freq: Every day | ORAL | 0 refills | Status: DC
  Filled 2021-08-26 – 2021-09-23 (×3): qty 360, 90d supply, fill #0

## 2021-08-26 MED ORDER — MODAFINIL 200 MG PO TABS
300.0000 mg | ORAL_TABLET | Freq: Every morning | ORAL | 0 refills | Status: DC
  Filled 2021-08-26: qty 135, 90d supply, fill #0
  Filled 2021-10-01: qty 121, 80d supply, fill #0
  Filled 2021-10-02: qty 14, 10d supply, fill #0

## 2021-08-26 MED ORDER — OLANZAPINE 20 MG PO TABS
20.0000 mg | ORAL_TABLET | Freq: Every day | ORAL | 0 refills | Status: DC
  Filled 2021-08-26: qty 90, 90d supply, fill #0

## 2021-08-26 MED ORDER — BUPROPION HCL ER (SR) 200 MG PO TB12
200.0000 mg | ORAL_TABLET | Freq: Every morning | ORAL | 0 refills | Status: DC
  Filled 2021-08-26 – 2021-09-25 (×2): qty 90, 90d supply, fill #0

## 2021-08-26 MED ORDER — ALPRAZOLAM 0.5 MG PO TABS
0.5000 mg | ORAL_TABLET | Freq: Two times a day (BID) | ORAL | 0 refills | Status: DC | PRN
  Filled 2021-08-26: qty 30, 15d supply, fill #0

## 2021-08-26 MED ORDER — SERTRALINE HCL 50 MG PO TABS
75.0000 mg | ORAL_TABLET | Freq: Every day | ORAL | 0 refills | Status: DC
  Filled 2021-08-26 – 2021-09-26 (×2): qty 135, 90d supply, fill #0

## 2021-08-26 NOTE — Addendum Note (Signed)
Addended by: Dominga Ferry on: 08/26/2021 11:20 AM   Modules accepted: Level of Service

## 2021-08-26 NOTE — Progress Notes (Signed)
CLEARANCE NOTES HAVE BEEN FAXED TO REQUESTING OFFICE  

## 2021-08-26 NOTE — Progress Notes (Signed)
Beverly Cole [id number] 04-26-1960 61 y.o.  Video Visit via My Chart  I connected with pt by My Chart and verified that I am speaking with the correct person using two identifiers.   I discussed the limitations, risks, security and privacy concerns of performing an evaluation and management service by My Chart  and the availability of in person appointments. I also discussed with the patient that there may be a patient responsible charge related to this service. The patient expressed understanding and agreed to proceed.  I discussed the assessment and treatment plan with the patient. The patient was provided an opportunity to ask questions and all were answered. The patient agreed with the plan and demonstrated an understanding of the instructions.   The patient was advised to call back or seek an in-person evaluation if the symptoms worsen or if the condition fails to improve as anticipated.  I provided 30 minutes of video time during this encounter.  The patient was located at home and the provider was located office. Session from 330 until 4 PM  Subjective:   Patient ID:  Beverly Cole is a 61 y.o. (DOB Mar 23, 1960) female.  Chief Complaint:  Chief Complaint  Patient presents with   Follow-up    Bipolar disorder with severe depression ([place])   Depression   Anxiety   Fatigue    Depression        Associated symptoms include decreased concentration and fatigue.  Associated symptoms include no suicidal ideas.  Past medical history includes anxiety.   Anxiety Symptoms include decreased concentration, dizziness, nervous/anxious behavior and shortness of breath. Patient reports no chest pain, confusion or suicidal ideas.     [name]  today for follow-up of chronic depression and anxiety.  Lately depression worse than anxiety.  Seen with husband today  When seen May 11, 2018.  For persistent anxiety we elected to retry buspirone and try increasing it to 30 mg twice daily if  tolerated.   Couldn't tolerate it this time DT muscle spasms.  Pharmacy called saying she could have serotonin syndrome.  When seen August 09, 2018 and she refused med changes despite chronic anxiety and depression. She remained on sertraline 50, Depakote ER 1000 mg, Wellbutrin SR 200 mg AM  Panic wearing cloth masks.  Using a shield.  Not in the office today bc H exposed to Covid.    seen December 09, 2018.  Because of chronic depression and dysfunction with fatigue and poor motivation we decided off label trial  trial of modafinil 200 mg 1/2 each am for 1 week then 200 mg each AM. Did see benefit from it.  More energy and working to stay out of bed more.    seen January 10, 2019.  The modafinil had been helpful.  No meds were changed.  seen April 07, 2019.  The following was noted: Still sees benefit from modafinil but still not caring for herself like she should.  Not wanting to go anywhere.  Not driven since early fall.  Hard to breathe with the mask and it creates anxiety.  Hard to be in enclosed spaces like cars and planes for extended periods.   Trying to set her alarm clock.  No hallucinations.  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  Plans to send more too.  Still follow through is not great.  Still depression and productivity is still poor but it is  not as poor as it was before modafinil. Plan increase modafinil to 300 mg daily to see if function can be improved.  06/29/2019 appointment, the following is noted: Rare Xanax.  Did increase modafinil to 300 mg daily. Saw benefit with the increase with less time in bed but still markedly functionally impaired.   It seems like I get used to it and then doesn't maintain her get up and go.  Still having a hard time with self care like hygiene. Chronic low motivation and lack of interest is unchanged.   Gall bladder problems.  It's better at the moment.    Not more sad,  just like I've always been.  Can't make herself leave the house DT depression and anxiety.  She won't do chores.   Less excess sleeping.  He works from home 2 days weekly.  No periods of hyperactivity or manic sx since the spring. Attending therapy every 2 weeks.   Can be confused when first wakes up regardless of time of day. Had episode of hallucination in the middle of the night when awakened. Scarecrow frightened her. This is rare event.  No hallucinations during the day.  Melatonin makes it worse. Has had fearful thoughts of planes crashing or bad things happening to others and it might be her fault.  Fight with brother lately and not speaking with him now. Plan:  No med changes  10/11/19 appt with the following noted: Unsteady and tremors gotten worse.  Not gone to doctor. Propranolol not helping as much.   On Depakote ER '1000mg'$  HS which is a reduction. So tired all the time and H says she's not doing anything.  H says accomplishing littte and still lays in bed a lot.  H says it's 2-3 PM before she gets OOB.  Memory is poor.  Everything is such a chore and doesn't want to do things. H says she starts things she doesn't finish.  Lots of new projects.   Increase modafinil on her own to 400 mg daily in the AM.  Anxious.  Rare Xanax.  Was happy when got new cats in the summer.  Anxious and Depressed and describes anxiety as Moderate. Anxiety symptoms include: Excessive Worry,.  She thinks sertraline reduces her obsessiveness.  Past hx panic so avoidant. Never get to relax.  Pt reports sleeps excessively even with modafinil. Pt reports that appetite is good. Pt reports that energy is poor and loss of interest or pleasure in usual activities, poor motivation and withdrawn from usual activities. Just don't care about things and admits to a lot of general anxiety and everything takes a lot of energy out of her.  Compulsively tapes and watches TV shows, news and awards shows.  Can't delete it bc I'll miss  something.  Watches TV in bed. Concentration is poor. Suicidal thoughts:  denied by patient. But chronic death thoughts.  Poor productivity overall.  Chronic poor self care, showers only weekly.  Just don't care.  Chronic disability.  She thinks sertraline helped the anxiety some.  Likes that H is at home.  Helps her mood.  Doesn't care about going out. Feels faint when she tries to wear a mask.  Too anxious driving and is avoidant. Collects TV shows, she can't erase unless she watches entirely. Hoards some bc afraid she might need it some day.   Rare use of Xanax bc fears addiction. Plan: Attempted referral to neurology at [facility] neurology.  They refused to see patient stating they had nothing  to offer her. It was suggested to patient that she try to get in with Dr. Jannifer Franklin whom she had seen in the past.  01/10/2020 appointment with the following noted: Reduced modafinil to 300 mg bc didn't want to get hooked on anything about 2-3 weeks.  Did initially see benefit from the modafinil but seemed to get tolerant to it. Feels better not talking to brother bc differing views of politics.  Feels he's a negative person but never depressed. Overall thinks she's doing pretty well relatively.  However is chronically depressed and never happy.  Nothing changed with the meds. Depression worse than anxiety. H didn't think modafinil made much difference.  H CO her inactivity.. She felt it was helpful for energy initially. Had to cancel trips bc can't wear a mask for that long.  Makes her sad. Driven twice in a year or so DT anxiety. Wants prn Xanax. Plan: DC sertraline Start fluoxetine 20 mg daily with olanzapine 5 mg daily.  05/22/2020 appointment with following noted: Didn't remember to stop sertraline and took fluoxetine and olanzapine for 2 days and couldn't eat and stopped it. Then went to dermatologist December who told her couldn't take Zoloft with fungus med and stopped sertraline. Then became more  depressed.  I have to go back on the Zoloft. Remained on modafinil, Wellbutrin, Depakote.  Even less motivated to do anything like cook, clean.  Won't go to [employer] etc. Chronic depression with hopelessness to get better Plan: No med changes  07/31/2020 appointment with the following noted: Back on sertraline at 75 mg daily with Remained on modafinil, Wellbutrin, Depakote.  Aunt died and one of worst fears but not thinking about it. Family got Covid but she recovered.  She thought it might kill and didn't care. Tremors are getting worse and balance problems. Went to concert and that was a problem.  Enjoyed the concert. Also saw Kathrene Bongo.  Music always important to her.  1980 had planned to commit suicide listening to an album over and over but didn't attempt. B and M have familial tremor also. Still anxious and depressed and haven't driven in a year DT anxiety. Plan: Option of trial olanzapine alone without fluoxetine.  Rec retry.  She agrees.  10/23/2020 appointment with the following noted: "Olanzapine is really working."  Initial SE drunk at 5 mg HS so cut it in half.  Got used to it but I feel happy and more relaxed and easier time going to sleep.  If I could still chose to die I would but clearly happier but not more hopeful.  Laughing more.  No SI. Needs to care about things more.  Chronic lack of motivations. Still doesn't want to leave the house but did enjoy a concert in [place]. Chronic unsteadiness is worse.  Last disc with doctor in March who rec PT but she never did the PT.  Misses aunt who died.   She reduced the Depakote to 500 mg HS. When stopped sertraline was not doing well and restarted it bc stressed and unhappy.  Seems to be a key med for me. Pending card test soon.  Feels better than in the past. RLS is worse after starting olanzapine and even affecting arms but had it before.  Happens after lays down at night.  Takes MG which helps.   Xanax about once every 3  weeks. Plan: Successful trial olanzapine 2.5 mg with sertraline 50 but worse RLS Trial gabapentin for RLS 100-300 mg PM and disc SE.  Higher if needed..  Once controlled consider increase olanzapine.  01/23/2021 appointment with the following noted: Taking gabapentin 300 mg but needs to go higher for RLS.  SE dizzy and reduced concentration. Reads on internet.   Increased olanzapine to 5 mg HS a couple of mos ago and feels better with it.  Happier.  More relaxed.  Only think that's helped in a long time. Handwriting is so much better, less shakey. Has RLS and can be bad when she lays down. Got a new kitten and enjoys it.  Now has 4 cats. Afraid to try new meds after negative reaction to Vraylar.  Afraid of any med change.  Afraid of having SI and afraid she'd do it if had SI.   Saw pulmonologist over weakness and he says she's OK except deconditioned.  Can't tolerate wearing the masks. Plan: Successful trial olanzapine 5 mg with sertraline 50 for depression and anxiety but worse RLS Consider increasing the olanzapine later if RLS can be managed. increase gabapentin for RLS 400-600 mg PM and disc SE.  Higher if needed..  Once controlled consider increase olanzapine.  03/28/2021 appointment with the following noted: Uses Ivory soap behind knee with benefit.  Increased gabapentin to 600 mg PM.  Dizziness resolved which occurred when turned over in bed.  Not a problem. RLS still 5/7 days.  But had no vacation RLS bc was walking more.  Not active at home. Sleep is great 8-9 hours and best in a long time. Enjoyed expensive vacation to [facility].  Once in a lifetime thing.  Some things she didn't like for example the cost. Went to [employer] concert and others. Depression is better.  Olanzapine really helped.  Didn't expect it.   Hasn't felt this good in years and H notices.   Caffeine 2 cups hot tea and limited. Plan: Successful trial olanzapine 5 mg with sertraline 50 for depression and anxiety but  worse RLS Consider increasing the olanzapine later if RLS can be managed. increase gabapentin for ok to increase RLS 600-900 mg PM and disc SE.  Higher if needed..  Once controlled consider increase olanzapine.  05/27/2021 phone call wanting to increase olanzapine to see if it will further help depression.  She had increased it on her own to 10 mg for a couple of weeks.  She has to increase to 15 mg.  This was agreed  06/26/21 appt noted: On sertraline 50 and olanzapine 15 mg HS with 1200 mg PM gabapentin for RLS RLS kicks in about 4 hours after olanzapine.  Prefers to stay up late bc doesn't really want to go to sleep.  RLS 5 days per week. Sleep 8 hours. Caffeine 2 cups hot tea and limited. Benefit from increase olanzapine. Helped anxiety and able to go to sleep earlier than usual.   Before trip was happier than I've been in years.  Not real happy with trip and now nothing to look forward to.   SE some dizziness with gabapentin.  But it's better.   Mood still better than before olanzapine. Plan: benefit olanzapine 15 mg with sertraline 50 for depression and anxiety but worse RLS She wants to Consider increasing the olanzapine later if RLS can be managed. OK increase olanzapine to 20 mg PM If RLS is worse then will have to reduce.  08/26/2021 appointment with the following noted: Increased olanzapine 20 mg HS.  Not sure if it made a difference.  Terrible time with memorfy ongoing. Sleep good.  Still has RLS  and can get pain in her arm including yesterday at 6-7 PM.   Asks about Ozempic for OCD. Depends on H [name]. Anxiety is better and that helps sleep.  Depression better than it was.  No longer thinks of death all the time like she use to.  Still not very active.  Need something to look forward to. Stays up late and likes doing so bc less depressed in evening.     Past psych med trials: Wellbutrin XL 300, Sertraline 75, fluoxetine SE brief  Vraylar SI, Latuda, Abilify , olanzapine  20 Symbyax side effects pramipexole, modafinil,  Seroquel which caused side effects of constipation,   lamotrigine,  Hx primidone sed refuses lithium because of altered taste.   She has a history of ataxia on 1500 mg of Depakote.   buspirone SE hallucinations Gabapentin for RLS ECT 2016 without help,   Review of Systems:  Review of Systems  Constitutional:  Positive for fatigue.  Respiratory:  Positive for shortness of breath.   Cardiovascular:  Negative for chest pain.  Gastrointestinal:  Positive for abdominal pain. Negative for vomiting.  Musculoskeletal:  Negative for gait problem.  Neurological:  Positive for dizziness and tremors. Negative for weakness.       RLS  Psychiatric/Behavioral:  Positive for decreased concentration and dysphoric mood. Negative for agitation, behavioral problems, confusion, hallucinations, self-injury, sleep disturbance and suicidal ideas. The patient is nervous/anxious. The patient is not hyperactive.     Medications: I have reviewed the patient's current medications.  Current Outpatient Medications  Medication Sig Dispense Refill   aspirin EC 325 MG tablet Take 325 mg by mouth daily.     dexlansoprazole (DEXILANT) 60 MG capsule Take 1 capsule (60 mg total) by mouth daily. 90 capsule 1   diltiazem (CARTIA XT) 180 MG 24 hr capsule Take 1 capsule (180 mg total) by mouth daily. 90 capsule 1   fluticasone (FLONASE) 50 MCG/ACT nasal spray Place 2 sprays into the nose daily as needed. For allergies     lidocaine (XYLOCAINE) 5 % ointment Apply a pea sized amount topically 20 minutes prior to intercourse, wipe off just prior to intercourse. 30 g 1   NONFORMULARY OR COMPOUNDED ITEM sm Azelaic acid/ metronidazole/ivermectin 15%/1%/1% cream apply to face bid 1 each 5   Probiotic Product (PROBIOTIC DAILY PO) Take 1 tablet by mouth daily.      propranolol (INDERAL) 20 MG tablet Take 1 tablet (20 mg total) by mouth 2 (two) times daily. 180 tablet 1   Sod  Picosulfate-Mag Ox-Cit Acd (CLENPIQ) 10-3.5-12 MG-GM -GM/175ML SOLN Use as directed per office and not instructions on packaging. Do not refrigerate. 350 mL 0   tretinoin (RETIN-A) 0.025 % cream Apply topically at bedtime. 45 g 4   trimethoprim (TRIMPEX) 100 MG tablet TAKE AFTER INTERCOURSE AS DIRECTED 30 tablet 2   ALPRAZolam (XANAX) 0.5 MG tablet TAKE 1 TABLET (0.5 MG TOTAL) BY MOUTH 2 (TWO) TIMES DAILY AS NEEDED FOR ANXIETY. TAKE 1 TABLET 30 MINUTES PRIOR TO PROCEDURE 30 tablet 0   buPROPion (WELLBUTRIN SR) 200 MG 12 hr tablet Take 1 tablet (200 mg total) by mouth every morning. 90 tablet 0   fexofenadine (ALLEGRA ALLERGY) 180 MG tablet Take 1 tablet (180 mg total) by mouth daily. (Patient not taking: Reported on 08/26/2021)     gabapentin (NEURONTIN) 300 MG capsule Take 4 capsules (1,200 mg total) by mouth daily. 360 capsule 0   modafinil (PROVIGIL) 200 MG tablet Take 1.5 tablets (300 mg total)  by mouth in the morning. 135 tablet 0   OLANZapine (ZYPREXA) 20 MG tablet Take 1 tablet (20 mg total) by mouth at bedtime. 90 tablet 0   sertraline (ZOLOFT) 50 MG tablet Take 1.5 tablets (75 mg total) by mouth daily. 135 tablet 0   No current facility-administered medications for this visit.    Medication Side Effects: None  Allergies:  Allergies  Allergen Reactions   Cariprazine Other (See Comments)    Patient becomes suicidal when taking this medication.  Patient becomes suicidal when taking this medication.    Metoprolol Other (See Comments)    Hallucinations hair falls out Hallucinations hair falls out   Nickel Rash   Iron Nausea And Vomiting    Past Medical History:  Diagnosis Date   Anxiety    Atrial fibrillation ([place])    a. Event monitor 2013 - PACs/bradycardia/SVT/short run of atrial fib by event monitor.    BIPOLAR DISORDER UNSPECIFIED    Bradycardia    Bursitis of hip 09/1999   Bilateral - Dr. Berenice Primas   DEPRESSION    Emphysema lung ([place]) 06/20/2016   pt states pulmonalongist  stated started recently   GERD    Headache(784.0)    was with menstrual cycle. no longer a problem   HYPERLIPIDEMIA    Hypertension    Hypertension    IRRITABLE BOWEL SYNDROME, HX OF    Menopausal state 01/2012   [facility] = 88.5   Mitral valve prolapse 12/17/2000   a. dx 1980s, most recent echo did not demonstrate this.   MYALGIA    Normal coronary arteries    a. by cardiac CT 2013.   PAT (paroxysmal atrial tachycardia) (HCC)    PE (pulmonary embolism) 02/09/2015   a. Bilateral PEs 01/2015 when d-dimer 0.69, CP lying on left side. Followed by heme-onc - + lupus anticoagulant, mildly depressed protein S. Dr. Marin Olp is not certain if her hypercoagulable studies are significant for a thrombophilic state.   Premature atrial contractions    Pulmonary embolus (HCC) 02/10/2015   Shortness of breath    Pulmonary eval 05/2002   Thyroid nodule 2014   Tremors of nervous system     Family History  Problem Relation Age of Onset   Lung cancer Mother    Hypertension Mother    Hyperlipidemia Mother    Cancer Father        Esophageal cancer   Hyperlipidemia Father    Depression Father    Cancer Brother    Pulmonary embolism Brother    Colon cancer Paternal Uncle    Sarcoidosis Other    Testicular cancer Other     Social History   Socioeconomic History   Marital status: Married    Spouse name: Not on file   Number of children: 0   Years of education: Not on file   Highest education level: Not on file  Occupational History   Occupation: Nurse-Personal Care/HH    Employer: UNEMPLOYED  Tobacco Use   Smoking status: Former    Packs/day: 1.00    Years: 25.00    Total pack years: 25.00    Types: Cigarettes    Quit date: 02/11/1996    Years since quitting: 25.5   Smokeless tobacco: Never   Tobacco comments:    Married, lives with spouse. 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   Please see review of systems for further details on the patient's review from today.   Objective:   Physical Exam:  LMP 05/11/2012   Physical Exam Neurological:     Mental Status: She is alert and oriented to person, place, and time.     Cranial Nerves: No dysarthria.     Motor: Tremor present.  Psychiatric:        Attention and Perception: Attention and perception normal.        Mood and Affect: Mood is anxious and depressed.        Speech: Speech normal. Speech is not slurred.        Behavior: Behavior is cooperative.        Thought Content: Thought content normal. Thought content is not paranoid or delusional. Thought content does not include homicidal or suicidal ideation. Thought content does not include suicidal plan.        Cognition and Memory: Cognition and memory normal.        Judgment: Judgment normal.     Comments: Insight intact Depression and anxiety are markedly better not gone back on sertraline with olanzapine 20 mg added Better affect and told a joke.   Some smiling.       Lab Review:     Component Value Date/Time   NA 141 04/25/2021 1436   NA 145 (H) 04/05/2020 1508   NA 142 01/21/2017 1013   NA 143 01/16/2016 1005   K 3.9 04/25/2021 1436   K 3.2 (L) 01/21/2017  1013   K 3.3 (L) 01/16/2016 1005   CL 105 04/25/2021 1436   CL 106 01/21/2017 1013   CO2 25 04/25/2021 1436   CO2 29 01/21/2017 1013   CO2 20 (L) 01/16/2016 1005   GLUCOSE 104 (H) 04/25/2021 1436   GLUCOSE 95 01/21/2017 1013   BUN 12 04/25/2021 1436   BUN 14 04/05/2020 1508   BUN 9 01/21/2017 1013   BUN 12.6 01/16/2016 1005   CREATININE 0.93 04/25/2021 1436   CREATININE 0.94 12/20/2019 1449   CREATININE 0.9 01/21/2017 1013   CREATININE 0.9 01/16/2016 1005   CALCIUM 10.1 04/25/2021 1436   CALCIUM 9.2 01/21/2017 1013   CALCIUM 9.5 01/16/2016 1005   PROT 7.2 04/25/2021 1436   PROT 7.2 04/05/2020 1508   PROT 6.9 01/21/2017 1013   PROT 7.1 01/16/2016 1005   ALBUMIN 4.4 04/25/2021 1436   ALBUMIN 4.6 04/05/2020 1508   ALBUMIN 3.6 01/16/2016 1005   AST 18 04/25/2021 1436   AST 13 (L) 12/20/2019 1449   AST 16 01/16/2016 1005   ALT 19 04/25/2021 1436   ALT 15 12/20/2019 1449   ALT 22 01/21/2017 1013   ALT 24 01/16/2016 1005  ALKPHOS 87 04/25/2021 1436   ALKPHOS 62 01/21/2017 1013   ALKPHOS 81 01/16/2016 1005   BILITOT 0.3 04/25/2021 1436   BILITOT <0.2 04/05/2020 1508   BILITOT 0.3 12/20/2019 1449   BILITOT 0.59 01/16/2016 1005   GFRNONAA 76 04/05/2020 1508   GFRNONAA >60 12/20/2019 1449   GFRAA 87 04/05/2020 1508   GFRAA >60 12/03/2018 1346       Component Value Date/Time   WBC 7.2 04/25/2021 1436   RBC 4.70 04/25/2021 1436   HGB 12.4 04/25/2021 1436   HGB 13.7 04/05/2020 1508   HGB 12.8 01/21/2017 1013   HCT 38.3 04/25/2021 1436   HCT 41.4 04/05/2020 1508   HCT 38.2 01/21/2017 1013   PLT 316.0 04/25/2021 1436   PLT 289 04/05/2020 1508   MCV 81.4 04/25/2021 1436   MCV 85 04/05/2020 1508   MCV 90 01/21/2017 1013   MCH 28.0 04/05/2020 1508   MCH 28.9 12/20/2019 1449   MCHC 32.5 04/25/2021 1436   RDW 16.3 (H) 04/25/2021 1436   RDW 15.0 04/05/2020 1508   RDW 14.5 01/21/2017 1013   LYMPHSABS 2.3 04/25/2021 1436   LYMPHSABS 2.5 04/05/2020 1508   LYMPHSABS 3.6  (H) 01/21/2017 1013   MONOABS 0.3 04/25/2021 1436   EOSABS 0.2 04/25/2021 1436   EOSABS 0.1 04/05/2020 1508   EOSABS 0.5 01/21/2017 1013   BASOSABS 0.1 04/25/2021 1436   BASOSABS 0.1 04/05/2020 1508   BASOSABS 0.0 01/21/2017 1013    No results found for: "POCLITH", "LITHIUM"   Lab Results  Component Value Date   PHENYTOIN <0.5 ug/mL (L) 11/05/2006   VALPROATE 73.7 11/25/2016     .res Assessment: Plan:    Jema was seen today for follow-up, depression, anxiety and fatigue.  Diagnoses and all orders for this visit:  Major depressive disorder, recurrent episode, moderate (HCC) -     OLANZapine (ZYPREXA) 20 MG tablet; Take 1 tablet (20 mg total) by mouth at bedtime.  Generalized anxiety disorder -     sertraline (ZOLOFT) 50 MG tablet; Take 1.5 tablets (75 mg total) by mouth daily.  Panic disorder with agoraphobia -     sertraline (ZOLOFT) 50 MG tablet; Take 1.5 tablets (75 mg total) by mouth daily. -     ALPRAZolam (XANAX) 0.5 MG tablet; TAKE 1 TABLET (0.5 MG TOTAL) BY MOUTH 2 (TWO) TIMES DAILY AS NEEDED FOR ANXIETY. TAKE 1 TABLET 30 MINUTES PRIOR TO PROCEDURE  Claustrophobia  Hoarding behavior  Benign essential tremor  Bipolar disorder with severe depression ([place]) -     modafinil (PROVIGIL) 200 MG tablet; Take 1.5 tablets (300 mg total) by mouth in the morning. -     sertraline (ZOLOFT) 50 MG tablet; Take 1.5 tablets (75 mg total) by mouth daily.  Uncontrolled restless legs syndrome -     gabapentin (NEURONTIN) 300 MG capsule; Take 4 capsules (1,200 mg total) by mouth daily.  Agoraphobia -     ALPRAZolam (XANAX) 0.5 MG tablet; TAKE 1 TABLET (0.5 MG TOTAL) BY MOUTH 2 (TWO) TIMES DAILY AS NEEDED FOR ANXIETY. TAKE 1 TABLET 30 MINUTES PRIOR TO PROCEDURE  Other orders -     buPROPion (WELLBUTRIN SR) 200 MG 12 hr tablet; Take 1 tablet (200 mg total) by mouth every morning.   Poor sleep hygiene.  Improved with modafinil and motivation is better with it but wants to  increase it.  TRD.  Lifelong history of chronic anhedonic depression and anxiety with very poor functioning continues.  Prognosis is guarded bc  multiple med failures and her resistance to change and chronicity of sx and low motivation for change.  She was getting some benefit from the prior medications a little bit more active and interested and motivated with the addition of modafinil without side effects.  She is highly dysfunctional and much worse off the sertraline for months..  She is not currently manic.  She is tolerating the medications.  She is had multiple medication failures as noted above.  She is fearful of trying new medications because of a history of suicidal thoughts on [place].   Failed all FDA approved bipolar depression meds except Symbyax.  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  Her symptoms are not gone and she is still severely impaired in function especially socially but she is much happier and much less anxious.  She wonders about increasing the olanzapine if she could get further improvement.  However she has had restless legs syndrome as a side effect which is currently partially managed.  Best response ever with current med regimen. Told a joke for the first time ever in session on this med regimen.  benefit olanzapine 20 mg with sertraline 50 for depression and anxiety but ongoing RLS She wants to Consider increasing the olanzapine later if RLS can be managed. continues olanzapine to 20 mg PM If RLS is worse then will have to reduce.  continue gabapentin for ok to continue RLS 1200 mg PM and disc SE.    Caffeine can worsen RLS.  Disc timing in relation to olanzapine. Caution about excessive dizziness from gabapentin and fall risk.  Tolerated ok right now.  Discussed potential metabolic side effects associated with atypical antipsychotics, as well as  potential risk for movement side effects. Advised pt to contact office if movement side effects occur.  For tremor is considering increasing the propranolol. She'll check with cardiologist on this.  Push fluids bc so much Time in bed and past history of dehydration. Disc risk propranolol but it helps tremor.  Increase activity.  Work on sleep schedule and try to regulate it for overall mental health benefits.  Keep working on improving activity.  Every little bit of progress can be additive over time.  Discussed the Cognitive behavioral approaches.  Doesn't drive much.  She is not motivated to do these things right now.  Modafinil would be likely safer than traditional stimulants which could cause psychotic sx.  Discussed side effects.  She has had none.   Continue other meds. Dont' increase further, Benefit 400 mg modafinil.  Okay to continue 300 mg daily  This appt was 30 mins.  FU 12 weeks  Lynder Parents, MD, DFAPA   Please see After Visit Summary for patient specific instructions.  Future Appointments  Date Time Provider [place]  09/06/2021  3:00 PM Barrie Folk, [place] [facility] None  10/02/2021  3:00 PM Bauert, Nicolasa Ducking, LCSW [facility] None  10/29/2021  3:00 PM Bauert, Nicolasa Ducking, LCSW [facility] None  01/16/2022  2:20 PM Moye, [place], MD [facility] None    No orders of the defined types were placed in this encounter.     -------------------------------

## 2021-08-26 NOTE — Progress Notes (Addendum)
Virtual Visit via Telephone Note   Because of Beverly Cole's co-morbid illnesses, she is at least at moderate risk for complications without adequate follow up.  This format is felt to be most appropriate for this patient at this time.  The patient did not have access to video technology/had technical difficulties with video requiring transitioning to audio format only (telephone).  All issues noted in this document were discussed and addressed.  No physical exam could be performed with this format.  Please refer to the patient's chart for her consent to telehealth for [facility].  Evaluation Performed:  Preoperative cardiovascular risk assessment _____________   Date:  08/26/2021   Patient ID:  Beverly Cole, DOB Jun 29, 1960, MRN [id number] Patient Location:  Home Provider location:   Office  Primary Care Provider:  Ann Held, DO Primary Cardiologist:  Lauree Chandler, MD  Chief Complaint / Patient Profile   61 y.o. y/o female with a h/o hyperlipidemia, HTN, Bipolar disorder, IBS, Mitral valve prolapse, bilateral PE (December 2016), and SVT who is pending Endoscopy and Colonoscopy and scheduled today for telephonic preoperative cardiovascular risk assessment.  Call multiple times but did not picked up phone. Voicemail left. Patient called back.   Past Medical History    Past Medical History:  Diagnosis Date   Anxiety    Atrial fibrillation ([place])    a. Event monitor 2013 - PACs/bradycardia/SVT/short run of atrial fib by event monitor.    BIPOLAR DISORDER UNSPECIFIED    Bradycardia    Bursitis of hip 09/1999   Bilateral - Dr. Berenice Primas   DEPRESSION    Emphysema lung ([place]) 06/20/2016   pt states pulmonalongist stated started recently   GERD    Headache(784.0)    was with menstrual cycle. no longer a problem   HYPERLIPIDEMIA    Hypertension    Hypertension    IRRITABLE BOWEL SYNDROME, HX OF    Menopausal state 01/2012   [facility] = 88.5   Mitral valve  prolapse 12/17/2000   a. dx 1980s, most recent echo did not demonstrate this.   MYALGIA    Normal coronary arteries    a. by cardiac CT 2013.   PAT (paroxysmal atrial tachycardia) (HCC)    PE (pulmonary embolism) 02/09/2015   a. Bilateral PEs 01/2015 when d-dimer 0.69, CP lying on left side. Followed by heme-onc - + lupus anticoagulant, mildly depressed protein S. Dr. Marin Olp is not certain if her hypercoagulable studies are significant for a thrombophilic state.   Premature atrial contractions    Pulmonary embolus ([place]) 02/10/2015   Shortness of breath    Pulmonary eval 05/2002   Thyroid nodule 2014   Tremors of nervous system    Past Surgical History:  Procedure Laterality Date   COLONOSCOPY     LAPAROSCOPIC APPENDECTOMY N/A 03/17/2017   Procedure: APPENDECTOMY LAPAROSCOPIC;  Surgeon: Excell Seltzer, MD;  Location: [facility];  Service: General;  Laterality: N/A;   Lipoma (R) side  1990   Right back    Allergies  Allergies  Allergen Reactions   Cariprazine Other (See Comments)    Patient becomes suicidal when taking this medication.  Patient becomes suicidal when taking this medication.    Metoprolol Other (See Comments)    Hallucinations hair falls out Hallucinations hair falls out   Nickel Rash   Iron Nausea And Vomiting    History of Present Illness    Beverly Cole is a 61 y.o. female who presents via audio/video conferencing for a  telehealth visit today.  Pt was last seen in cardiology clinic on 01/2021 by Dr. Angelena Form.  At that time Beverly Cole was doing well .  The patient is now pending procedure as outlined above. Since her last visit, she well.  She denies any cardiac symptoms.  No chest pain, shortness of breath, dizziness, palpitation, orthopnea, PND, syncope or melena.  Getting greater than 4 METS of activity without any problem.  Home Medications    Prior to Admission medications   Medication Sig Start Date End Date Taking? Authorizing Provider  ALPRAZolam  (XANAX) 0.5 MG tablet TAKE 1 TABLET (0.5 MG TOTAL) BY MOUTH 2 (TWO) TIMES DAILY AS NEEDED FOR ANXIETY. TAKE 1 TABLET 30 MINUTES PRIOR TO PROCEDURE 01/17/21   Cottle, Billey Co., MD  aspirin EC 325 MG tablet Take 325 mg by mouth daily.    [provider]  buPROPion (WELLBUTRIN SR) 200 MG 12 hr tablet Take 1 tablet (200 mg total) by mouth every morning. 07/02/21   Cottle, Billey Co., MD  dexlansoprazole (DEXILANT) 60 MG capsule Take 1 capsule (60 mg total) by mouth daily. 05/01/21   Ann Held, DO  diltiazem (CARTIA XT) 180 MG 24 hr capsule Take 1 capsule (180 mg total) by mouth daily. 08/05/21   Carollee Herter, Alferd Apa, DO  fexofenadine (ALLEGRA ALLERGY) 180 MG tablet Take 1 tablet (180 mg total) by mouth daily. 04/24/20   Roma Schanz R, DO  fluticasone (FLONASE) 50 MCG/ACT nasal spray Place 2 sprays into the nose daily as needed. For allergies    [provider]  gabapentin (NEURONTIN) 300 MG capsule Take 4 capsules (1,200 mg total) by mouth daily. 06/26/21   Cottle, Billey Co., MD  lidocaine (XYLOCAINE) 5 % ointment Apply a pea sized amount topically 20 minutes prior to intercourse, wipe off just prior to intercourse. 07/17/21   Salvadore Dom, MD  modafinil (PROVIGIL) 200 MG tablet Take 1.5 tablets (300 mg total) by mouth in the morning. 07/02/21   Cottle, Billey Co., MD  NONFORMULARY OR COMPOUNDED ITEM sm Azelaic acid/ metronidazole/ivermectin 15%/1%/1% cream apply to face bid 05/03/20   Carollee Herter, Alferd Apa, DO  OLANZapine (ZYPREXA) 20 MG tablet Take 1 tablet (20 mg total) by mouth at bedtime. 06/26/21   Cottle, Billey Co., MD  Probiotic Product (PROBIOTIC DAILY PO) Take 1 tablet by mouth daily.     [provider]  propranolol (INDERAL) 20 MG tablet Take 1 tablet (20 mg total) by mouth 2 (two) times daily. 05/01/21   Ann Held, DO  sertraline (ZOLOFT) 50 MG tablet Take 1.5 tablets (75 mg total) by mouth daily. 07/02/21   Cottle, Billey Co.,  MD  Sod Picosulfate-Mag Ox-Cit [facility]) 10-3.5-12 MG-GM -GM/175ML SOLN Use as directed per office and not instructions on packaging. Do not refrigerate. 08/20/21     tretinoin (RETIN-A) 0.025 % cream Apply topically at bedtime. 01/17/21   Roma Schanz R, DO  trimethoprim (TRIMPEX) 100 MG tablet TAKE AFTER INTERCOURSE AS DIRECTED 01/17/21 01/17/22  Carollee Herter, Alferd Apa, DO  FLUoxetine (PROZAC) 20 MG capsule Take 1 capsule (20 mg total) by mouth daily. 01/10/20 04/24/20  Cottle, Billey Co., MD    Physical Exam    Vital Signs:  [name] does not have vital signs available for review today.  Given telephonic nature of communication, physical exam is limited. AAOx3. NAD. Normal affect.  Speech and respirations are unlabored.  Accessory Clinical Findings    None  Assessment & Plan    1.  Preoperative Cardiovascular Risk Assessment: Given past medical history and time since last visit, based on ACC/AHA guidelines, LABREA ECCLESTON would be at acceptable risk for the planned procedure without further cardiovascular testing.   The patient was advised that if she develops new symptoms prior to surgery to contact our office to arrange for a follow-up visit, and she verbalized understanding.  I will route this recommendation to the requesting party via Epic fax function and remove from pre-op pool.  Please call with questions.   A copy of this note will be routed to requesting surgeon.  Time:   Today, I have spent 13 minutes with the patient with telehealth technology discussing medical history, symptoms, and management plan.     [place], [place]  08/26/2021, 11:18 AM

## 2021-08-27 ENCOUNTER — Other Ambulatory Visit (HOSPITAL_COMMUNITY): Payer: Self-pay

## 2021-08-28 ENCOUNTER — Other Ambulatory Visit (HOSPITAL_COMMUNITY): Payer: Self-pay

## 2021-08-28 DIAGNOSIS — K317 Polyp of stomach and duodenum: Secondary | ICD-10-CM | POA: Diagnosis not present

## 2021-08-28 DIAGNOSIS — Z1211 Encounter for screening for malignant neoplasm of colon: Secondary | ICD-10-CM | POA: Diagnosis not present

## 2021-08-28 DIAGNOSIS — R195 Other fecal abnormalities: Secondary | ICD-10-CM | POA: Diagnosis not present

## 2021-08-28 DIAGNOSIS — K5730 Diverticulosis of large intestine without perforation or abscess without bleeding: Secondary | ICD-10-CM | POA: Diagnosis not present

## 2021-08-28 DIAGNOSIS — R194 Change in bowel habit: Secondary | ICD-10-CM | POA: Diagnosis not present

## 2021-08-28 LAB — HM COLONOSCOPY

## 2021-08-29 ENCOUNTER — Encounter: Payer: Self-pay | Admitting: *Deleted

## 2021-09-06 ENCOUNTER — Ambulatory Visit (INDEPENDENT_AMBULATORY_CARE_PROVIDER_SITE_OTHER): Payer: 59 | Admitting: Psychology

## 2021-09-06 DIAGNOSIS — F331 Major depressive disorder, recurrent, moderate: Secondary | ICD-10-CM

## 2021-09-06 NOTE — Progress Notes (Signed)
[facility] Counselor/Therapist Progress Note  Patient ID: Beverly Cole, MRN: [id number],    Date: 09/06/2021  Time Spent: 3:00pm-3:50pm   50 minutes   Treatment Type: Individual Therapy  Reported Symptoms: worrying  Mental Status Exam: Appearance:  Casual     Behavior: Appropriate  Motor: Normal  Speech/Language:  Normal Rate  Affect: Appropriate  Mood: normal  Thought process: normal  Thought content:   WNL  Sensory/Perceptual disturbances:   WNL  Orientation: oriented to person, place, time/date, and situation  Attention: Good  Concentration: Good  Memory: WNL  Fund of knowledge:  Good  Insight:   Good  Judgment:  Good  Impulse Control: Good   Risk Assessment: Danger to Self:  No Self-injurious Behavior: No Danger to Others: No Duty to Warn:no Physical Aggression / Violence:No  Access to Firearms a concern: No  Gang Involvement:No   Subjective: Pt present for face-to-face therapy via Webex.  Pt consents to telehealth video session due to COVID-19 pandemic.     Location of pt: home.  Location of therapist: home office Pt talked about having an appointment to see Dr. Clovis Pu in October in person.   Pt states she has been going outside more which is positive bc she had not been going outside for many months.   Pt wanted to do goal setting.   Helped pt come up with small attainable goals to help her feel productive and more active.   Pt states her mood has been stable overall.   Provided supportive counseling.   Interventions: Cognitive Behavioral Therapy and Insight-Oriented  Diagnosis: F33.1  Plan: See pt's Treatment Plan for depression in Therapy Charts.  (Treatment Plan Target Date: 10/25/2021) Pt is progressing toward treatment goals.   Plan to continue to see pt every month.   Malique Driskill, LCSW

## 2021-09-18 ENCOUNTER — Other Ambulatory Visit (HOSPITAL_COMMUNITY): Payer: Self-pay

## 2021-09-23 ENCOUNTER — Other Ambulatory Visit (HOSPITAL_COMMUNITY): Payer: Self-pay

## 2021-09-26 ENCOUNTER — Other Ambulatory Visit (HOSPITAL_COMMUNITY): Payer: Self-pay

## 2021-10-02 ENCOUNTER — Ambulatory Visit (INDEPENDENT_AMBULATORY_CARE_PROVIDER_SITE_OTHER): Payer: 59 | Admitting: Psychology

## 2021-10-02 ENCOUNTER — Other Ambulatory Visit (HOSPITAL_COMMUNITY): Payer: Self-pay

## 2021-10-02 DIAGNOSIS — F331 Major depressive disorder, recurrent, moderate: Secondary | ICD-10-CM

## 2021-10-02 NOTE — Progress Notes (Signed)
[facility] Counselor/Therapist Progress Note  Patient ID: Beverly Cole, MRN: [id number],    Date: 10/02/2021  Time Spent: 3:00pm-3:55pm   55 minutes   Treatment Type: Individual Therapy  Reported Symptoms: worrying  Mental Status Exam: Appearance:  Casual     Behavior: Appropriate  Motor: Normal  Speech/Language:  Normal Rate  Affect: Appropriate  Mood: normal  Thought process: normal  Thought content:   WNL  Sensory/Perceptual disturbances:   WNL  Orientation: oriented to person, place, time/date, and situation  Attention: Good  Concentration: Good  Memory: WNL  Fund of knowledge:  Good  Insight:   Good  Judgment:  Good  Impulse Control: Good   Risk Assessment: Danger to Self:  No Self-injurious Behavior: No Danger to Others: No Duty to Warn:no Physical Aggression / Violence:No  Access to Firearms a concern: No  Gang Involvement:No   Subjective: Pt present for face-to-face therapy via Webex.  Pt consents to telehealth video session due to COVID-19 pandemic.     Location of pt: home.  Location of therapist: home office Pt talked about having trouble following through with goals to make herself do things and be more active.    Helped pt come up with small attainable goals to help her feel productive and more active.   Pt talked about buying things to try to make herself happy.   She realizes that it does not work bc she feels happy for a short amount of time but it is not sustainable.   Addressed pt's impulse buying and worked on healthier coping strategies.   Pt states that she and her husband have not slept together since adopting a kitten bc [name] is sleeping with the kitten.   Pt is ok with it bc [name] snores badly anyway.    Provided supportive counseling.   Interventions: Cognitive Behavioral Therapy and Insight-Oriented  Diagnosis: F33.1  Plan: See pt's Treatment Plan for depression in Therapy Charts.  (Treatment Plan Target Date: 10/25/2021) Pt  is progressing toward treatment goals.   Plan to continue to see pt every month.   Kale Rondeau, LCSW

## 2021-10-07 ENCOUNTER — Telehealth: Payer: Self-pay | Admitting: Psychiatry

## 2021-10-07 NOTE — Telephone Encounter (Signed)
Pt lvm that she has lost her zyprexa. She only has enough to Thursday. She has lost 60 pills. She would like a new script sent to the pharmacy for 60. Pharmacy [facility] out pt  pharmacy

## 2021-10-08 ENCOUNTER — Other Ambulatory Visit: Payer: Self-pay | Admitting: Psychiatry

## 2021-10-08 DIAGNOSIS — F331 Major depressive disorder, recurrent, moderate: Secondary | ICD-10-CM

## 2021-10-08 NOTE — Telephone Encounter (Signed)
Ok to send

## 2021-10-09 ENCOUNTER — Other Ambulatory Visit (HOSPITAL_COMMUNITY): Payer: Self-pay

## 2021-10-09 MED ORDER — OLANZAPINE 20 MG PO TABS
20.0000 mg | ORAL_TABLET | Freq: Every day | ORAL | 0 refills | Status: DC
  Filled 2021-10-09: qty 30, 30d supply, fill #0
  Filled 2021-10-09: qty 90, 90d supply, fill #0
  Filled 2021-11-07: qty 30, 30d supply, fill #1

## 2021-10-09 NOTE — Telephone Encounter (Signed)
[facility] called at 9:30 to check status of refill of Beverly Cole's Olanzapine.  Pt lost her medication as noted in her message.

## 2021-10-22 ENCOUNTER — Other Ambulatory Visit: Payer: Self-pay | Admitting: Family Medicine

## 2021-10-22 DIAGNOSIS — K219 Gastro-esophageal reflux disease without esophagitis: Secondary | ICD-10-CM

## 2021-10-22 DIAGNOSIS — R251 Tremor, unspecified: Secondary | ICD-10-CM

## 2021-10-23 ENCOUNTER — Other Ambulatory Visit (HOSPITAL_COMMUNITY): Payer: Self-pay

## 2021-10-23 MED ORDER — DEXLANSOPRAZOLE 60 MG PO CPDR
1.0000 | DELAYED_RELEASE_CAPSULE | Freq: Every day | ORAL | 0 refills | Status: DC
  Filled 2021-10-23: qty 30, 30d supply, fill #0

## 2021-10-23 MED ORDER — PROPRANOLOL HCL 20 MG PO TABS
20.0000 mg | ORAL_TABLET | Freq: Two times a day (BID) | ORAL | 0 refills | Status: DC
  Filled 2021-10-23 – 2021-12-26 (×2): qty 60, 30d supply, fill #0

## 2021-10-28 ENCOUNTER — Other Ambulatory Visit: Payer: Self-pay | Admitting: Family Medicine

## 2021-10-28 ENCOUNTER — Other Ambulatory Visit (HOSPITAL_COMMUNITY): Payer: Self-pay

## 2021-10-28 DIAGNOSIS — Z1231 Encounter for screening mammogram for malignant neoplasm of breast: Secondary | ICD-10-CM

## 2021-10-29 ENCOUNTER — Ambulatory Visit (INDEPENDENT_AMBULATORY_CARE_PROVIDER_SITE_OTHER): Payer: 59 | Admitting: Psychology

## 2021-10-29 DIAGNOSIS — F331 Major depressive disorder, recurrent, moderate: Secondary | ICD-10-CM | POA: Diagnosis not present

## 2021-10-29 NOTE — Progress Notes (Signed)
[facility] Counselor Initial Adult Exam  Name: Beverly Cole Date: 10/29/2021 MRN: [id number] DOB: 1960/07/09 PCP: Ann Held, DO  Time spent: 3:00pm-3:55pm    67mnutes  Guardian/Payee:  n/a    Paperwork requested: No   Reason for Visit /Presenting Problem: Pt present for face-to-face initial assessment update via video Webex.  Pt consents to telehealth video session due to COVID 19 pandemic. Location of pt: home Location of therapist: home office.  Pt continues to have issues with depression and isolation.   She needs to continue in therapy monthly. Reviewed pt's treatment plan for annual update.   Updated pt's treatment plan and IA.   Pt participated in setting treatment goals.  Plan to meet monthly.   Mental Status Exam: Appearance:   Casual     Behavior:  Appropriate  Motor:  Normal  Speech/Language:   Normal Rate  Affect:  Appropriate  Mood:  normal  Thought process:  normal  Thought content:    WNL  Sensory/Perceptual disturbances:    WNL  Orientation:  oriented to person, place, time/date, and situation  Attention:  Good  Concentration:  Good  Memory:  WNL  Fund of knowledge:   Good  Insight:    Good  Judgment:   Good  Impulse Control:  Good    Reported Symptoms:  sadness, low motivation.  Risk Assessment: Danger to Self:  No Self-injurious Behavior: No Danger to Others: No Duty to Warn:no Physical Aggression / Violence:No  Access to Firearms a concern: No  Gang Involvement:No  Patient / guardian was educated about steps to take if suicide or homicide risk level increases between visits: n/a While future psychiatric events cannot be accurately predicted, the patient does not currently require acute inpatient psychiatric care and does not currently meet [facility] criteria.  Substance Abuse History: Current substance abuse: No     Past Psychiatric History:   Previous psychological history is  significant for depression Outpatient Providers:pt has been in therapy throughout her life. History of Psych Hospitalization: No Psychological Testing:  n/a    Abuse History:  Victim of: No.,  n/a    Report needed: No. Victim of Neglect:No. Perpetrator of  n/a   Witness / Exposure to Domestic Violence: No   Protective Services Involvement: No  Witness to [employer]iolence:  No   Family History:  Family History  Problem Relation Age of Onset   Lung cancer Mother    Hypertension Mother    Hyperlipidemia Mother    Cancer Father        Esophageal cancer   Hyperlipidemia Father    Depression Father    Cancer Brother    Pulmonary embolism Brother    Colon cancer Paternal Uncle    Sarcoidosis Other    Testicular cancer Other     Living situation: the patient lives with her spouse.  Pt grew up with both parents and older brother.  Family history of mental health issues:  depression in mother. No history of substance abuse or abuse.   Sexual Orientation: Straight  Relationship Status: married  Name of spouse / other:[name] If a parent, number of children / ages:n/a  Support Systems: spouse  Financial Stress:  No   Income/Employment/Disability: Employment by husband.    Pt is on disability.  Military Service: No   Educational History: Education: cScientist, product/process development Protestant  Any cultural differences that may affect / interfere with treatment:  not applicable   Recreation/Hobbies:  reading  Stressors: Health problems    Strengths: Supportive Relationships and Able to Communicate Effectively  Barriers:  none   Legal History: Pending legal issue / charges: The patient has no significant history of legal issues. History of legal issue / charges:  n/a  Medical History/Surgical History: reviewed Past Medical History:  Diagnosis Date   Anxiety    Atrial fibrillation ([place])    a. Event monitor 2013 - PACs/bradycardia/SVT/short run  of atrial fib by event monitor.    BIPOLAR DISORDER UNSPECIFIED    Bradycardia    Bursitis of hip 09/1999   Bilateral - Dr. Berenice Primas   DEPRESSION    Emphysema lung ([place]) 06/20/2016   pt states pulmonalongist stated started recently   GERD    Headache(784.0)    was with menstrual cycle. no longer a problem   HYPERLIPIDEMIA    Hypertension    Hypertension    IRRITABLE BOWEL SYNDROME, HX OF    Menopausal state 01/2012   [facility] = 88.5   Mitral valve prolapse 12/17/2000   a. dx 1980s, most recent echo did not demonstrate this.   MYALGIA    Normal coronary arteries    a. by cardiac CT 2013.   PAT (paroxysmal atrial tachycardia) (HCC)    PE (pulmonary embolism) 02/09/2015   a. Bilateral PEs 01/2015 when d-dimer 0.69, CP lying on left side. Followed by heme-onc - + lupus anticoagulant, mildly depressed protein S. Dr. Marin Olp is not certain if her hypercoagulable studies are significant for a thrombophilic state.   Premature atrial contractions    Pulmonary embolus ([place]) 02/10/2015   Shortness of breath    Pulmonary eval 05/2002   Thyroid nodule 2014   Tremors of nervous system     Past Surgical History:  Procedure Laterality Date   COLONOSCOPY     LAPAROSCOPIC APPENDECTOMY N/A 03/17/2017   Procedure: APPENDECTOMY LAPAROSCOPIC;  Surgeon: Excell Seltzer, MD;  Location: [facility];  Service: General;  Laterality: N/A;   Lipoma (R) side  1990   Right back    Medications: Current Outpatient Medications  Medication Sig Dispense Refill   ALPRAZolam (XANAX) 0.5 MG tablet TAKE 1 TABLET (0.5 MG TOTAL) BY MOUTH 2 (TWO) TIMES DAILY AS NEEDED FOR ANXIETY. TAKE 1 TABLET 30 MINUTES PRIOR TO PROCEDURE 30 tablet 0   aspirin EC 325 MG tablet Take 325 mg by mouth daily.     buPROPion (WELLBUTRIN SR) 200 MG 12 hr tablet Take 1 tablet (200 mg total) by mouth every morning. 90 tablet 0   dexlansoprazole (DEXILANT) 60 MG capsule Take 1 capsule (60 mg total) by mouth daily. 30 capsule 0   diltiazem (CARTIA  XT) 180 MG 24 hr capsule Take 1 capsule (180 mg total) by mouth daily. 90 capsule 1   fexofenadine (ALLEGRA ALLERGY) 180 MG tablet Take 1 tablet (180 mg total) by mouth daily. (Patient not taking: Reported on 08/26/2021)     fluticasone (FLONASE) 50 MCG/ACT nasal spray Place 2 sprays into the nose daily as needed. For allergies     gabapentin (NEURONTIN) 300 MG capsule Take 4 capsules (1,200 mg total) by mouth daily. 360 capsule 0   lidocaine (XYLOCAINE) 5 % ointment Apply a pea sized amount topically 20 minutes prior to intercourse, wipe off just prior to intercourse. 30 g 1   modafinil (PROVIGIL) 200 MG tablet Take 1.5 tablets (300 mg total) by mouth in the morning. 135 tablet 0   NONFORMULARY OR COMPOUNDED ITEM sm Azelaic acid/ metronidazole/ivermectin 15%/1%/1% cream apply  to face bid 1 each 5   OLANZapine (ZYPREXA) 20 MG tablet Take 1 tablet (20 mg total) by mouth at bedtime. 90 tablet 0   Probiotic Product (PROBIOTIC DAILY PO) Take 1 tablet by mouth daily.      propranolol (INDERAL) 20 MG tablet Take 1 tablet (20 mg total) by mouth 2 (two) times daily. 60 tablet 0   sertraline (ZOLOFT) 50 MG tablet Take 1.5 tablets (75 mg total) by mouth daily. 135 tablet 0   Sod Picosulfate-Mag Ox-Cit Acd (CLENPIQ) 10-3.5-12 MG-GM -GM/175ML SOLN Use as directed per office and not instructions on packaging. Do not refrigerate. 350 mL 0   tretinoin (RETIN-A) 0.025 % cream Apply topically at bedtime. 45 g 4   trimethoprim (TRIMPEX) 100 MG tablet TAKE AFTER INTERCOURSE AS DIRECTED 30 tablet 2   No current facility-administered medications for this visit.    Allergies  Allergen Reactions   Cariprazine Other (See Comments)    Patient becomes suicidal when taking this medication.  Patient becomes suicidal when taking this medication.    Metoprolol Other (See Comments)    Hallucinations hair falls out Hallucinations hair falls out   Nickel Rash   Iron Nausea And Vomiting    Diagnoses:  F33.1  Plan of  Care: Recommend ongoing therapy.   Pt participated in setting treatment goals.  Plan to meet monthly.    Treatment Plan Client Abilities/Strengths  Pt is bright, engaging, and motivated for therapy.   Client Treatment Preferences  Individual therapy.  Client Statement of Needs  Improve coping skills.  Symptoms  Depressed or irritable mood. Diminished interest in or enjoyment of activities. Lack of energy. Poor concentration and indecisiveness. Social withdrawal.  Problems Addressed  Unipolar Depression Goals 1. Alleviate depressive symptoms and return to previous level of effective functioning. 2. Appropriately grieve the loss in order to normalize mood and to return to previously adaptive level of functioning. Objective Learn and implement behavioral strategies to overcome depression. Target Date: 2022-10-30 Frequency: Biweekly  Progress: 50 Modality: individual  Related Interventions Engage the client in "behavioral activation," increasing his/her activity level and contact with sources of reward, while identifying processes that inhibit activation.  Use behavioral techniques such as instruction, rehearsal, role-playing, role reversal, as needed, to facilitate activity in the client's daily life; reinforce success. Assist the client in developing skills that increase the likelihood of deriving pleasure from behavioral activation (e.g., assertiveness skills, developing an exercise plan, less internal/more external focus, increased social involvement); reinforce success. Objective Identify important people in life, past and present, and describe the quality, good and poor, of those relationships. Target Date: 2022-10-30 Frequency: Biweekly  Progress: 50 Modality: individual  Related Interventions Conduct Interpersonal Therapy beginning with the assessment of the client's "interpersonal inventory" of important past and present relationships; develop a case formulation linking  depression to grief, interpersonal role disputes, role transitions, and/or interpersonal deficits). Objective Learn and implement problem-solving and decision-making skills. Target Date: 2022-10-30 Frequency: Biweekly  Progress: 50 Modality: individual  Related Interventions Conduct Problem-Solving Therapy using techniques such as psychoeducation, modeling, and role-playing to teach client problem-solving skills (i.e., defining a problem specifically, generating possible solutions, evaluating the pros and cons of each solution, selecting and implementing a plan of action, evaluating the efficacy of the plan, accepting or revising the plan); role-play application of the problem-solving skill to a real life issue. Encourage in the client the development of a positive problem orientation in which problems and solving them are viewed as a natural part of  life and not something to be feared, despaired, or avoided. 3. Develop healthy interpersonal relationships that lead to the alleviation and help prevent the relapse of depression. 4. Develop healthy thinking patterns and beliefs about self, others, and the world that lead to the alleviation and help prevent the relapse of depression. 5. Recognize, accept, and cope with feelings of depression. Diagnosis F33.1  Conditions For Discharge Achievement of treatment goals and objectives   Clint Bolder, LCSW

## 2021-11-06 ENCOUNTER — Other Ambulatory Visit: Payer: Self-pay | Admitting: Psychiatry

## 2021-11-06 ENCOUNTER — Other Ambulatory Visit (HOSPITAL_COMMUNITY): Payer: Self-pay

## 2021-11-06 DIAGNOSIS — F331 Major depressive disorder, recurrent, moderate: Secondary | ICD-10-CM

## 2021-11-06 NOTE — Telephone Encounter (Signed)
Pt has appt 10/17 she will not run out before appt

## 2021-11-07 ENCOUNTER — Other Ambulatory Visit (HOSPITAL_COMMUNITY): Payer: Self-pay

## 2021-11-21 ENCOUNTER — Ambulatory Visit (INDEPENDENT_AMBULATORY_CARE_PROVIDER_SITE_OTHER): Payer: 59 | Admitting: Psychology

## 2021-11-21 DIAGNOSIS — F331 Major depressive disorder, recurrent, moderate: Secondary | ICD-10-CM

## 2021-11-21 NOTE — Progress Notes (Signed)
[facility] Counselor/Therapist Progress Note  Patient ID: Beverly Cole, MRN: [id number],    Date: 11/21/2021  Time Spent: 3:00pm - 3:50pm   50 minutes   Treatment Type: Individual Therapy  Reported Symptoms: sadness  Mental Status Exam: Appearance:  Casual     Behavior: Appropriate  Motor: Normal  Speech/Language:  Normal Rate  Affect: Appropriate  Mood: normal  Thought process: normal  Thought content:   WNL  Sensory/Perceptual disturbances:   WNL  Orientation: oriented to person, place, time/date, and situation  Attention: Good  Concentration: Good  Memory: WNL  Fund of knowledge:  Good  Insight:   Good  Judgment:  Good  Impulse Control: Good   Risk Assessment: Danger to Self:  No Self-injurious Behavior: No Danger to Others: No Duty to Warn:no Physical Aggression / Violence:No  Access to Firearms a concern: No  Gang Involvement:No   Subjective: Pt present for face-to-face individual therapy via video Webex.  Pt consents to telehealth video session due to COVID 19 pandemic. Location of pt: home Location of therapist: home office.   Pt talked about having a hard time being motivated to follow through with any task.    Worked on how pt can increase motivation.   Pt talked about concerns about what is going on in [place].   She feels sad for the people affected by that war.  Helped pt process her feelings.   Provided supportive therapy.    Interventions: Cognitive Behavioral Therapy and Insight-Oriented  Diagnosis: F33.1  Plan of Care: Recommend ongoing therapy.   Pt participated in setting treatment goals.  Plan to meet monthly.    Treatment Plan Client Abilities/Strengths  Pt is bright, engaging, and motivated for therapy.   Client Treatment Preferences  Individual therapy.  Client Statement of Needs  Improve coping skills.  Symptoms  Depressed or irritable mood. Diminished interest in or enjoyment of activities. Lack of energy. Poor  concentration and indecisiveness. Social withdrawal.  Problems Addressed  Unipolar Depression Goals 1. Alleviate depressive symptoms and return to previous level of effective functioning. 2. Appropriately grieve the loss in order to normalize mood and to return to previously adaptive level of functioning. Objective Learn and implement behavioral strategies to overcome depression. Target Date: 2022-10-30 Frequency: Biweekly  Progress: 50 Modality: individual  Related Interventions Engage the client in "behavioral activation," increasing his/her activity level and contact with sources of reward, while identifying processes that inhibit activation.  Use behavioral techniques such as instruction, rehearsal, role-playing, role reversal, as needed, to facilitate activity in the client's daily life; reinforce success. Assist the client in developing skills that increase the likelihood of deriving pleasure from behavioral activation (e.g., assertiveness skills, developing an exercise plan, less internal/more external focus, increased social involvement); reinforce success. Objective Identify important people in life, past and present, and describe the quality, good and poor, of those relationships. Target Date: 2022-10-30 Frequency: Biweekly  Progress: 50 Modality: individual  Related Interventions Conduct Interpersonal Therapy beginning with the assessment of the client's "interpersonal inventory" of important past and present relationships; develop a case formulation linking depression to grief, interpersonal role disputes, role transitions, and/or interpersonal deficits). Objective Learn and implement problem-solving and decision-making skills. Target Date: 2022-10-30 Frequency: Biweekly  Progress: 50 Modality: individual  Related Interventions Conduct Problem-Solving Therapy using techniques such as psychoeducation, modeling, and role-playing to teach client problem-solving skills (i.e.,  defining a problem specifically, generating possible solutions, evaluating the pros and cons of each solution, selecting and implementing a plan of  action, evaluating the efficacy of the plan, accepting or revising the plan); role-play application of the problem-solving skill to a real life issue. Encourage in the client the development of a positive problem orientation in which problems and solving them are viewed as a natural part of life and not something to be feared, despaired, or avoided. 3. Develop healthy interpersonal relationships that lead to the alleviation and help prevent the relapse of depression. 4. Develop healthy thinking patterns and beliefs about self, others, and the world that lead to the alleviation and help prevent the relapse of depression. 5. Recognize, accept, and cope with feelings of depression. Diagnosis F33.1  Conditions For Discharge Achievement of treatment goals and objectives   Clint Bolder, LCSW

## 2021-11-26 ENCOUNTER — Encounter: Payer: Self-pay | Admitting: Psychiatry

## 2021-11-26 ENCOUNTER — Other Ambulatory Visit (HOSPITAL_COMMUNITY): Payer: Self-pay

## 2021-11-26 ENCOUNTER — Ambulatory Visit (INDEPENDENT_AMBULATORY_CARE_PROVIDER_SITE_OTHER): Payer: 59 | Admitting: Psychiatry

## 2021-11-26 DIAGNOSIS — F331 Major depressive disorder, recurrent, moderate: Secondary | ICD-10-CM | POA: Diagnosis not present

## 2021-11-26 MED ORDER — OLANZAPINE 10 MG PO TABS
10.0000 mg | ORAL_TABLET | Freq: Every day | ORAL | 0 refills | Status: DC
  Filled 2021-11-26: qty 90, 90d supply, fill #0

## 2021-11-26 NOTE — Patient Instructions (Addendum)
Stop Wellbutrin Reduce olanzapine to 10 mg nightly Start Auvelity 1 in the AM for 1 week, then 1 twice daily.

## 2021-11-26 NOTE — Progress Notes (Signed)
Beverly Cole [id number] May 30, 1960 61 y.o.   Subjective:   Patient ID:  Beverly Cole is a 61 y.o. (DOB Nov 12, 1960) female.  Chief Complaint:  Chief Complaint  Patient presents with   Follow-up    Bipolar disorder with severe depression ([place])   Depression   Anxiety    Depression        Associated symptoms include decreased concentration and fatigue.  Associated symptoms include no suicidal ideas.  Past medical history includes anxiety.   Anxiety Symptoms include decreased concentration, dizziness, nervous/anxious behavior and shortness of breath. Patient reports no chest pain, confusion or suicidal ideas.     [name]  today for follow-up of chronic depression and anxiety.  Lately depression worse than anxiety.  Seen with husband today  When seen May 11, 2018.  For persistent anxiety we elected to retry buspirone and try increasing it to 30 mg twice daily if tolerated.   Couldn't tolerate it this time DT muscle spasms.  Pharmacy called saying she could have serotonin syndrome.  When seen August 09, 2018 and she refused med changes despite chronic anxiety and depression. She remained on sertraline 50, Depakote ER 1000 mg, Wellbutrin SR 200 mg AM  Panic wearing cloth masks.  Using a shield.  Not in the office today bc H exposed to Covid.    seen December 09, 2018.  Because of chronic depression and dysfunction with fatigue and poor motivation we decided off label trial  trial of modafinil 200 mg 1/2 each am for 1 week then 200 mg each AM. Did see benefit from it.  More energy and working to stay out of bed more.    seen January 10, 2019.  The modafinil had been helpful.  No meds were changed.  seen April 07, 2019.  The following was noted: Still sees benefit from modafinil but still not caring for herself like she should.  Not wanting to go anywhere.  Not driven since early fall.  Hard to breathe with the mask and it creates anxiety.  Hard to be in enclosed spaces like  cars and planes for extended periods.   Trying to set her alarm clock.  No hallucinations.  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  Plans to send more too.  Still follow through is not great.  Still depression and productivity is still poor but it is not as poor as it was before modafinil. Plan increase modafinil to 300 mg daily to see if function can be improved.  06/29/2019 appointment, the following is noted: Rare Xanax.  Did increase modafinil to 300 mg daily. Saw benefit with the increase with less time in bed but still markedly functionally impaired.   It seems like I get used to it and then doesn't maintain her get up and go.  Still having a hard time with self care like hygiene. Chronic low motivation and lack of interest is unchanged.   Gall bladder problems.  It's better at the moment.    Not more sad, just like I've always been.  Can't make herself leave the house DT depression and anxiety.  She won't do chores.   Less excess sleeping.  He works from home 2 days weekly.  No periods of hyperactivity or manic sx since the spring. Attending therapy every 2 weeks.   Can be confused when first wakes up regardless of time of day. Had episode of hallucination  in the middle of the night when awakened. Scarecrow frightened her. This is rare event.  No hallucinations during the day.  Melatonin makes it worse. Has had fearful thoughts of planes crashing or bad things happening to others and it might be her fault.  Fight with brother lately and not speaking with him now. Plan:  No med changes  10/11/19 appt with the following noted: Unsteady and tremors gotten worse.  Not gone to doctor. Propranolol not helping as much.   On Depakote ER '1000mg'$  HS which is a reduction. So tired all the time and H says she's not doing anything.  H says accomplishing littte and still lays in bed a lot.  H says it's 2-3 PM before she gets OOB.   Memory is poor.  Everything is such a chore and doesn't want to do things. H says she starts things she doesn't finish.  Lots of new projects.   Increase modafinil on her own to 400 mg daily in the AM.  Anxious.  Rare Xanax.  Was happy when got new cats in the summer.  Anxious and Depressed and describes anxiety as Moderate. Anxiety symptoms include: Excessive Worry,.  She thinks sertraline reduces her obsessiveness.  Past hx panic so avoidant. Never get to relax.  Pt reports sleeps excessively even with modafinil. Pt reports that appetite is good. Pt reports that energy is poor and loss of interest or pleasure in usual activities, poor motivation and withdrawn from usual activities. Just don't care about things and admits to a lot of general anxiety and everything takes a lot of energy out of her.  Compulsively tapes and watches TV shows, news and awards shows.  Can't delete it bc I'll miss something.  Watches TV in bed. Concentration is poor. Suicidal thoughts:  denied by patient. But chronic death thoughts.  Poor productivity overall.  Chronic poor self care, showers only weekly.  Just don't care.  Chronic disability.  She thinks sertraline helped the anxiety some.  Likes that H is at home.  Helps her mood.  Doesn't care about going out. Feels faint when she tries to wear a mask.  Too anxious driving and is avoidant. Collects TV shows, she can't erase unless she watches entirely. Hoards some bc afraid she might need it some day.   Rare use of Xanax bc fears addiction. Plan: Attempted referral to neurology at [facility] neurology.  They refused to see patient stating they had nothing to offer her. It was suggested to patient that she try to get in with Dr. Jannifer Franklin whom she had seen in the past.  01/10/2020 appointment with the following noted: Reduced modafinil to 300 mg bc didn't want to get hooked on anything about 2-3 weeks.  Did initially see benefit from the modafinil but seemed to get tolerant to  it. Feels better not talking to brother bc differing views of politics.  Feels he's a negative person but never depressed. Overall thinks she's doing pretty well relatively.  However is chronically depressed and never happy.  Nothing changed with the meds. Depression worse than anxiety. H didn't think modafinil made much difference.  H CO her inactivity.. She felt it was helpful for energy initially. Had to cancel trips bc can't wear a mask for that long.  Makes her sad. Driven twice in a year or so DT anxiety. Wants prn Xanax. Plan: DC sertraline Start fluoxetine 20 mg daily with olanzapine 5 mg daily.  05/22/2020 appointment with following noted: Didn't remember to stop  sertraline and took fluoxetine and olanzapine for 2 days and couldn't eat and stopped it. Then went to dermatologist December who told her couldn't take Zoloft with fungus med and stopped sertraline. Then became more depressed.  I have to go back on the Zoloft. Remained on modafinil, Wellbutrin, Depakote.  Even less motivated to do anything like cook, clean.  Won't go to [employer] etc. Chronic depression with hopelessness to get better Plan: No med changes  07/31/2020 appointment with the following noted: Back on sertraline at 75 mg daily with Remained on modafinil, Wellbutrin, Depakote.  Aunt died and one of worst fears but not thinking about it. Family got Covid but she recovered.  She thought it might kill and didn't care. Tremors are getting worse and balance problems. Went to concert and that was a problem.  Enjoyed the concert. Also saw Kathrene Bongo.  Music always important to her.  1980 had planned to commit suicide listening to an album over and over but didn't attempt. B and M have familial tremor also. Still anxious and depressed and haven't driven in a year DT anxiety. Plan: Option of trial olanzapine alone without fluoxetine.  Rec retry.  She agrees.  10/23/2020 appointment with the following  noted: "Olanzapine is really working."  Initial SE drunk at 5 mg HS so cut it in half.  Got used to it but I feel happy and more relaxed and easier time going to sleep.  If I could still chose to die I would but clearly happier but not more hopeful.  Laughing more.  No SI. Needs to care about things more.  Chronic lack of motivations. Still doesn't want to leave the house but did enjoy a concert in [place]. Chronic unsteadiness is worse.  Last disc with doctor in March who rec PT but she never did the PT.  Misses aunt who died.   She reduced the Depakote to 500 mg HS. When stopped sertraline was not doing well and restarted it bc stressed and unhappy.  Seems to be a key med for me. Pending card test soon.  Feels better than in the past. RLS is worse after starting olanzapine and even affecting arms but had it before.  Happens after lays down at night.  Takes MG which helps.   Xanax about once every 3 weeks. Plan: Successful trial olanzapine 2.5 mg with sertraline 50 but worse RLS Trial gabapentin for RLS 100-300 mg PM and disc SE.  Higher if needed..  Once controlled consider increase olanzapine.  01/23/2021 appointment with the following noted: Taking gabapentin 300 mg but needs to go higher for RLS.  SE dizzy and reduced concentration. Reads on internet.   Increased olanzapine to 5 mg HS a couple of mos ago and feels better with it.  Happier.  More relaxed.  Only think that's helped in a long time. Handwriting is so much better, less shakey. Has RLS and can be bad when she lays down. Got a new kitten and enjoys it.  Now has 4 cats. Afraid to try new meds after negative reaction to Vraylar.  Afraid of any med change.  Afraid of having SI and afraid she'd do it if had SI.   Saw pulmonologist over weakness and he says she's OK except deconditioned.  Can't tolerate wearing the masks. Plan: Successful trial olanzapine 5 mg with sertraline 50 for depression and anxiety but worse RLS Consider  increasing the olanzapine later if RLS can be managed. increase gabapentin for RLS 400-600 mg  PM and disc SE.  Higher if needed..  Once controlled consider increase olanzapine.  03/28/2021 appointment with the following noted: Uses Ivory soap behind knee with benefit.  Increased gabapentin to 600 mg PM.  Dizziness resolved which occurred when turned over in bed.  Not a problem. RLS still 5/7 days.  But had no vacation RLS bc was walking more.  Not active at home. Sleep is great 8-9 hours and best in a long time. Enjoyed expensive vacation to [facility].  Once in a lifetime thing.  Some things she didn't like for example the cost. Went to [employer] concert and others. Depression is better.  Olanzapine really helped.  Didn't expect it.   Hasn't felt this good in years and H notices.   Caffeine 2 cups hot tea and limited. Plan: Successful trial olanzapine 5 mg with sertraline 50 for depression and anxiety but worse RLS Consider increasing the olanzapine later if RLS can be managed. increase gabapentin for ok to increase RLS 600-900 mg PM and disc SE.  Higher if needed..  Once controlled consider increase olanzapine.  05/27/2021 phone call wanting to increase olanzapine to see if it will further help depression.  She had increased it on her own to 10 mg for a couple of weeks.  She has to increase to 15 mg.  This was agreed  06/26/21 appt noted: On sertraline 50 and olanzapine 15 mg HS with 1200 mg PM gabapentin for RLS RLS kicks in about 4 hours after olanzapine.  Prefers to stay up late bc doesn't really want to go to sleep.  RLS 5 days per week. Sleep 8 hours. Caffeine 2 cups hot tea and limited. Benefit from increase olanzapine. Helped anxiety and able to go to sleep earlier than usual.   Before trip was happier than I've been in years.  Not real happy with trip and now nothing to look forward to.   SE some dizziness with gabapentin.  But it's better.   Mood still better than before  olanzapine. Plan: benefit olanzapine 15 mg with sertraline 50 for depression and anxiety but worse RLS She wants to Consider increasing the olanzapine later if RLS can be managed. OK increase olanzapine to 20 mg PM If RLS is worse then will have to reduce.  08/26/2021 appointment with the following noted: Increased olanzapine 20 mg HS.  Not sure if it made a difference.  Terrible time with memorfy ongoing. Sleep good.  Still has RLS and can get pain in her arm including yesterday at 6-7 PM.   Asks about Ozempic for OCD. Depends on H [name]. Anxiety is better and that helps sleep.  Depression better than it was.  No longer thinks of death all the time like she use to.  Still not very active.  Need something to look forward to. Stays up late and likes doing so bc less depressed in evening.   Plan continue olanzapine 20 mg daily with sertraline 75 mg daily  11/26/2021 appointment noted:  seen with Sherren Kerns Memory is poor.  Forgets TV shows. Taking alprazolam rarely. Overall thinks she is doing fine.  Still not driving much.  Don't like to leave the house but went outside on the deck some which is improvement. Sleeps 12 hours nightly but then up all day. Happier with olanazapine and thinks the higher doses did help the anxiety. H doesn't see any changes in the last year.  Covid messed her up and she has become housebound.  Not social.  She got used to being inside and no motivation to change it. H cleans and prepares meals.  Doesn't have motivation for chores.   Past psych med trials: Wellbutrin XL 300, Sertraline 75, fluoxetine SE brief  Vraylar SI, Latuda, Abilify , olanzapine 20 Symbyax side effects pramipexole, modafinil,  Seroquel which caused side effects of constipation,   lamotrigine,  Hx primidone sed refuses lithium because of altered taste.   She has a history of ataxia on 1500 mg of Depakote.   buspirone SE hallucinations Gabapentin for RLS ECT 2016 without help,   Review of  Systems:  Review of Systems  Constitutional:  Positive for fatigue.  Respiratory:  Positive for shortness of breath.   Cardiovascular:  Negative for chest pain.  Gastrointestinal:  Positive for abdominal pain. Negative for vomiting.  Musculoskeletal:  Positive for gait problem.  Neurological:  Positive for dizziness and tremors. Negative for weakness.       RLS  Psychiatric/Behavioral:  Positive for decreased concentration and dysphoric mood. Negative for agitation, behavioral problems, confusion, hallucinations, self-injury, sleep disturbance and suicidal ideas. The patient is nervous/anxious. The patient is not hyperactive.     Medications: I have reviewed the patient's current medications.  Current Outpatient Medications  Medication Sig Dispense Refill   ALPRAZolam (XANAX) 0.5 MG tablet TAKE 1 TABLET (0.5 MG TOTAL) BY MOUTH 2 (TWO) TIMES DAILY AS NEEDED FOR ANXIETY. TAKE 1 TABLET 30 MINUTES PRIOR TO PROCEDURE 30 tablet 0   aspirin EC 325 MG tablet Take 325 mg by mouth daily.     buPROPion (WELLBUTRIN SR) 200 MG 12 hr tablet Take 1 tablet (200 mg total) by mouth every morning. 90 tablet 0   dexlansoprazole (DEXILANT) 60 MG capsule Take 1 capsule (60 mg total) by mouth daily. 30 capsule 0   diltiazem (CARTIA XT) 180 MG 24 hr capsule Take 1 capsule (180 mg total) by mouth daily. 90 capsule 1   fluticasone (FLONASE) 50 MCG/ACT nasal spray Place 2 sprays into the nose daily as needed. For allergies     gabapentin (NEURONTIN) 300 MG capsule Take 4 capsules (1,200 mg total) by mouth daily. 360 capsule 0   lidocaine (XYLOCAINE) 5 % ointment Apply a pea sized amount topically 20 minutes prior to intercourse, wipe off just prior to intercourse. 30 g 1   modafinil (PROVIGIL) 200 MG tablet Take 1.5 tablets (300 mg total) by mouth in the morning. 135 tablet 0   NONFORMULARY OR COMPOUNDED ITEM sm Azelaic acid/ metronidazole/ivermectin 15%/1%/1% cream apply to face bid 1 each 5   OLANZapine (ZYPREXA)  20 MG tablet Take 1 tablet (20 mg total) by mouth at bedtime. 90 tablet 0   Probiotic Product (PROBIOTIC DAILY PO) Take 1 tablet by mouth daily.      propranolol (INDERAL) 20 MG tablet Take 1 tablet (20 mg total) by mouth 2 (two) times daily. 60 tablet 0   sertraline (ZOLOFT) 50 MG tablet Take 1.5 tablets (75 mg total) by mouth daily. 135 tablet 0   tretinoin (RETIN-A) 0.025 % cream Apply topically at bedtime. 45 g 4   trimethoprim (TRIMPEX) 100 MG tablet TAKE AFTER INTERCOURSE AS DIRECTED 30 tablet 2   fexofenadine (ALLEGRA ALLERGY) 180 MG tablet Take 1 tablet (180 mg total) by mouth daily. (Patient not taking: Reported on 08/26/2021)     Sod Picosulfate-Mag Ox-Cit Acd (CLENPIQ) 10-3.5-12 MG-GM -GM/175ML SOLN Use as directed per office and not instructions on packaging. Do not refrigerate. 350 mL 0   No current facility-administered  medications for this visit.    Medication Side Effects: None  Allergies:  Allergies  Allergen Reactions   Cariprazine Other (See Comments)    Patient becomes suicidal when taking this medication.  Patient becomes suicidal when taking this medication.    Metoprolol Other (See Comments)    Hallucinations hair falls out Hallucinations hair falls out   Nickel Rash   Iron Nausea And Vomiting    Past Medical History:  Diagnosis Date   Anxiety    Atrial fibrillation ([place])    a. Event monitor 2013 - PACs/bradycardia/SVT/short run of atrial fib by event monitor.    BIPOLAR DISORDER UNSPECIFIED    Bradycardia    Bursitis of hip 09/1999   Bilateral - Dr. Berenice Primas   DEPRESSION    Emphysema lung ([place]) 06/20/2016   pt states pulmonalongist stated started recently   GERD    Headache(784.0)    was with menstrual cycle. no longer a problem   HYPERLIPIDEMIA    Hypertension    Hypertension    IRRITABLE BOWEL SYNDROME, HX OF    Menopausal state 01/2012   Total [facility] = 88.5   Mitral valve prolapse 12/17/2000   a. dx 1980s, most recent echo did not demonstrate this.    MYALGIA    Normal coronary arteries    a. by cardiac CT 2013.   PAT (paroxysmal atrial tachycardia)    PE (pulmonary embolism) 02/09/2015   a. Bilateral PEs 01/2015 when d-dimer 0.69, CP lying on left side. Followed by heme-onc - + lupus anticoagulant, mildly depressed protein S. Dr. Marin Olp is not certain if her hypercoagulable studies are significant for a thrombophilic state.   Premature atrial contractions    Pulmonary embolus (H[facility]) 02/10/2015   Shortness of breath    Pulmonary eval 05/2002   Thyroid nodule 2014   Tremors of nervous system     Family History  Problem Relation Age of Onset   Lung cancer Mother    Hypertension Mother    Hyperlipidemia Mother    Cancer Father        Esophageal cancer   Hyperlipidemia Father    Depression Father    Cancer Brother    Pulmonary embolism Brother    Colon cancer Paternal Uncle    Sarcoidosis Other    Testicular cancer Other     Social History   Socioeconomic History   Marital status: Married    Spouse name: Not on file   Number of children: 0   Years of education: Not on file   Highest education level: Not on file  Occupational History   Occupation: Nurse-Personal Care/HH    Employer: UNEMPLOYED  Tobacco Use   Smoking status: Former    Packs/day: 1.00    Years: 25.00    Total pack years: 25.00    Types: Cigarettes    Quit date: 02/11/1996    Years since quitting: 25.8   Smokeless tobacco: Never   Tobacco comments:    Married, lives with spouse. 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   Please see review of systems for further details on the patient's review from today.   Objective:   Physical Exam:  LMP 05/11/2012   Physical Exam Constitutional:      General: She is not in acute distress. Musculoskeletal:        General: No deformity.  Neurological:     Mental Status: She is alert and oriented to person, place, and time.     Cranial Nerves: No dysarthria.     Motor: Tremor present.     Coordination: Coordination normal.  Psychiatric:        Attention and Perception: Attention and perception normal. She does not perceive auditory or visual hallucinations.        Mood and Affect: Mood is anxious and depressed. Affect is not labile, blunt, angry or inappropriate.        Speech: Speech normal. Speech is not slurred.        Behavior: Behavior is cooperative.        Thought Content: Thought content is not paranoid or delusional. Thought content does not include homicidal or suicidal ideation. Thought content does not include homicidal or suicidal plan.        Cognition and Memory: Cognition and memory normal.     Comments: Insight intact Depression and anxiety are markedly better not gone back on sertraline with olanzapine 20 mg added Better affect and told a joke.   Some smiling.       Lab Review:     Component Value Date/Time   NA 141 04/25/2021 1436   NA 145 (H) 04/05/2020 1508   NA 142 01/21/2017 1013   NA 143 01/16/2016 1005   K 3.9 04/25/2021 1436   K 3.2 (L) 01/21/2017 1013   K 3.3 (L) 01/16/2016 1005   CL 105 04/25/2021 1436   CL 106 01/21/2017 1013   CO2 25 04/25/2021 1436   CO2 29  01/21/2017 1013   CO2 20 (L) 01/16/2016 1005   GLUCOSE 104 (H) 04/25/2021 1436   GLUCOSE 95 01/21/2017 1013   BUN 12 04/25/2021 1436   BUN 14 04/05/2020 1508   BUN 9 01/21/2017 1013   BUN 12.6 01/16/2016 1005   CREATININE 0.93 04/25/2021 1436   CREATININE 0.94 12/20/2019 1449   CREATININE 0.9 01/21/2017 1013   CREATININE 0.9 01/16/2016 1005   CALCIUM 10.1 04/25/2021 1436   CALCIUM 9.2 01/21/2017 1013   CALCIUM 9.5 01/16/2016 1005   PROT 7.2 04/25/2021 1436   PROT 7.2 04/05/2020 1508   PROT 6.9 01/21/2017 1013   PROT 7.1 01/16/2016 1005   ALBUMIN 4.4 04/25/2021 1436   ALBUMIN 4.6 04/05/2020 1508   ALBUMIN 3.6 01/16/2016 1005   AST 18 04/25/2021 1436   AST 13 (L) 12/20/2019 1449   AST 16 01/16/2016 1005   ALT 19 04/25/2021 1436   ALT 15 12/20/2019 1449   ALT 22 01/21/2017 1013   ALT 24 01/16/2016 1005   ALKPHOS 87 04/25/2021 1436   ALKPHOS 62 01/21/2017 1013   ALKPHOS 81 01/16/2016  1005   BILITOT 0.3 04/25/2021 1436   BILITOT <0.2 04/05/2020 1508   BILITOT 0.3 12/20/2019 1449   BILITOT 0.59 01/16/2016 1005   GFRNONAA 76 04/05/2020 1508   GFRNONAA >60 12/20/2019 1449   GFRAA 87 04/05/2020 1508   GFRAA >60 12/03/2018 1346       Component Value Date/Time   WBC 7.2 04/25/2021 1436   RBC 4.70 04/25/2021 1436   HGB 12.4 04/25/2021 1436   HGB 13.7 04/05/2020 1508   HGB 12.8 01/21/2017 1013   HCT 38.3 04/25/2021 1436   HCT 41.4 04/05/2020 1508   HCT 38.2 01/21/2017 1013   PLT 316.0 04/25/2021 1436   PLT 289 04/05/2020 1508   MCV 81.4 04/25/2021 1436   MCV 85 04/05/2020 1508   MCV 90 01/21/2017 1013   MCH 28.0 04/05/2020 1508   MCH 28.9 12/20/2019 1449   MCHC 32.5 04/25/2021 1436   RDW 16.3 (H) 04/25/2021 1436   RDW 15.0 04/05/2020 1508   RDW 14.5 01/21/2017 1013   LYMPHSABS 2.3 04/25/2021 1436   LYMPHSABS 2.5 04/05/2020 1508   LYMPHSABS 3.6 (H) 01/21/2017 1013   MONOABS 0.3 04/25/2021 1436   EOSABS 0.2 04/25/2021 1436   EOSABS 0.1 04/05/2020 1508   EOSABS  0.5 01/21/2017 1013   BASOSABS 0.1 04/25/2021 1436   BASOSABS 0.1 04/05/2020 1508   BASOSABS 0.0 01/21/2017 1013    No results found for: "POCLITH", "LITHIUM"   Lab Results  Component Value Date   PHENYTOIN <0.5 ug/mL (L) 11/05/2006   VALPROATE 73.7 11/25/2016     .res Assessment: Plan:    There are no diagnoses linked to this encounter.  Poor sleep hygiene.  Improved with modafinil and motivation is better with it but wants to increase it.  TRD.  Lifelong history of chronic anhedonic depression and anxiety with very poor functioning continues.  Prognosis is guarded bc multiple med failures and her resistance to change and chronicity of sx and low motivation for change.  She was getting some benefit from the prior medications a little bit more active and interested and motivated with the addition of modafinil without side effects.  She is highly dysfunctional and much worse off the sertraline for months..  She is not currently manic.  She is tolerating the medications.  She is had multiple medication failures as noted above.  She is fearful of trying new medications because of a history of suicidal thoughts on Vraylar.   Failed all FDA approved bipolar depression meds except Symbyax.  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  Her symptoms are not gone and she is still severely impaired in function especially socially but she is much happier and much less anxious.  She wonders about increasing the olanzapine if she could get further improvement.  However she has had restless legs syndrome as a side effect which is currently partially managed.  Best response ever with current med regimen. Told a joke for the first time ever in session on this med regimen.  continue sertraline 50 for depression and anxiety  She wants to Consider increasing the olanzapine later if RLS can be  managed. Reduce olanzapine to 10 mg HS bc unclear if higher dose helped more than lower. RLS is better with MG  Trial Auvelity for TRD 1 in AM for 1 week, then 1 twice daily. DC Wellbutrin  continue gabapentin for ok to continue RLS 1200 mg PM  and disc SE.    Caffeine can worsen RLS.  Disc timing in relation to olanzapine. Caution about excessive dizziness from gabapentin and fall risk.  Tolerated ok right now.  Discussed potential metabolic side effects associated with atypical antipsychotics, as well as potential risk for movement side effects. Advised pt to contact office if movement side effects occur.  For tremor is considering increasing the propranolol. She'll check with cardiologist on this.  Push fluids bc so much Time in bed and past history of dehydration. Disc risk propranolol but it helps tremor.  Increase activity.  Work on sleep schedule and try to regulate it for overall mental health benefits.  Keep working on improving activity.  Every little bit of progress can be additive over time.  Discussed the Cognitive behavioral approaches.  Doesn't drive much.  She is not motivated to do these things right now.  Modafinil would be likely safer than traditional stimulants which could cause psychotic sx.  Discussed side effects.  She has had none.   Continue other meds. Dont' increase further, Benefit 400 mg modafinil.  Okay to continue 300 mg daily  This appt was 30 mins.  FU 8 weeks  Lynder Parents, MD, DFAPA   Please see After Visit Summary for patient specific instructions.  Future Appointments  Date Time Provider [place]  12/19/2021  3:00 PM Bauert, Nicolasa Ducking, LCSW [facility] None  12/27/2021  3:10 PM [facility] MM 2 [facility]  01/16/2022  2:20 PM Moye, [place], MD [facility] None    No orders of the defined types were placed in this encounter.     -------------------------------

## 2021-11-27 ENCOUNTER — Other Ambulatory Visit (HOSPITAL_COMMUNITY): Payer: Self-pay

## 2021-11-27 ENCOUNTER — Ambulatory Visit: Payer: 59

## 2021-12-18 ENCOUNTER — Other Ambulatory Visit: Payer: Self-pay | Admitting: Family Medicine

## 2021-12-18 ENCOUNTER — Other Ambulatory Visit (HOSPITAL_COMMUNITY): Payer: Self-pay

## 2021-12-18 DIAGNOSIS — N952 Postmenopausal atrophic vaginitis: Secondary | ICD-10-CM

## 2021-12-18 DIAGNOSIS — K219 Gastro-esophageal reflux disease without esophagitis: Secondary | ICD-10-CM

## 2021-12-19 ENCOUNTER — Other Ambulatory Visit (HOSPITAL_COMMUNITY): Payer: Self-pay

## 2021-12-19 ENCOUNTER — Ambulatory Visit (INDEPENDENT_AMBULATORY_CARE_PROVIDER_SITE_OTHER): Payer: 59 | Admitting: Psychology

## 2021-12-19 DIAGNOSIS — F331 Major depressive disorder, recurrent, moderate: Secondary | ICD-10-CM | POA: Diagnosis not present

## 2021-12-19 MED ORDER — DEXLANSOPRAZOLE 60 MG PO CPDR
1.0000 | DELAYED_RELEASE_CAPSULE | Freq: Every day | ORAL | 0 refills | Status: DC
  Filled 2021-12-19: qty 30, 30d supply, fill #0

## 2021-12-19 MED ORDER — TRIMETHOPRIM 100 MG PO TABS
ORAL_TABLET | ORAL | 2 refills | Status: DC
  Filled 2021-12-19: qty 30, 30d supply, fill #0
  Filled 2022-01-13: qty 30, 30d supply, fill #1
  Filled 2022-02-21: qty 30, 30d supply, fill #2

## 2021-12-19 NOTE — Progress Notes (Signed)
[facility] Counselor/Therapist Progress Note  Patient ID: Beverly Cole, MRN: [id number],    Date: 12/19/2021  Time Spent: 3:00pm - 3:50pm   50 minutes   Treatment Type: Individual Therapy  Reported Symptoms: sadness  Mental Status Exam: Appearance:  Casual     Behavior: Appropriate  Motor: Normal  Speech/Language:  Normal Rate  Affect: Appropriate  Mood: normal  Thought process: normal  Thought content:   WNL  Sensory/Perceptual disturbances:   WNL  Orientation: oriented to person, place, time/date, and situation  Attention: Good  Concentration: Good  Memory: WNL  Fund of knowledge:  Good  Insight:   Good  Judgment:  Good  Impulse Control: Good   Risk Assessment: Danger to Self:  No Self-injurious Behavior: No Danger to Others: No Duty to Warn:no Physical Aggression / Violence:No  Access to Firearms a concern: No  Gang Involvement:No   Subjective: Pt present for face-to-face individual therapy via video Webex.  Pt consents to telehealth video session due to COVID 19 pandemic. Location of pt: home Location of therapist: home office.   Pt talked about having good news.  She is selling her [place] and getting a new car.  She wants to get a RAV4.  She will special order a 2024.  Pt and husband are planning a trip in the spring that pt is excited about.    Pt is also planning to go to [place] in 2026 to celebrate her 40th wedding anniversary.  Pt feels happy about having some things to look forward to.   Pt is doing significant house projects in January.   They are hiring workers to paint the whole house and [facility] throughout.   Pt talked about her [name] having an accident biking and cracking 2 ribs.   [name] is having to rest from exercise for awhile.  [name] has a biking group and a car group and a fishing group he gets together with.  Pt wants to lose weight before her vacations.   Addressed what eating plan she will follow to work on  weight loss.    Pt saw her psychiatrist who prescribed a new medication for pt.  The medication is helping improve pt's mood.  She is also more motivated to do things.   Provided supportive therapy.    Interventions: Cognitive Behavioral Therapy and Insight-Oriented  Diagnosis: F33.1  Plan of Care: Recommend ongoing therapy.   Pt participated in setting treatment goals.  Plan to meet monthly.    Treatment Plan Client Abilities/Strengths  Pt is bright, engaging, and motivated for therapy.   Client Treatment Preferences  Individual therapy.  Client Statement of Needs  Improve coping skills.  Symptoms  Depressed or irritable mood. Diminished interest in or enjoyment of activities. Lack of energy. Poor concentration and indecisiveness. Social withdrawal.  Problems Addressed  Unipolar Depression Goals 1. Alleviate depressive symptoms and return to previous level of effective functioning. 2. Appropriately grieve the loss in order to normalize mood and to return to previously adaptive level of functioning. Objective Learn and implement behavioral strategies to overcome depression. Target Date: 2022-10-30 Frequency: Biweekly  Progress: 50 Modality: individual  Related Interventions Engage the client in "behavioral activation," increasing his/her activity level and contact with sources of reward, while identifying processes that inhibit activation.  Use behavioral techniques such as instruction, rehearsal, role-playing, role reversal, as needed, to facilitate activity in the client's daily life; reinforce success. Assist the client in developing skills that increase the likelihood of  deriving pleasure from behavioral activation (e.g., assertiveness skills, developing an exercise plan, less internal/more external focus, increased social involvement); reinforce success. Objective Identify important people in life, past and present, and describe the quality, good and poor, of those  relationships. Target Date: 2022-10-30 Frequency: Biweekly  Progress: 50 Modality: individual  Related Interventions Conduct Interpersonal Therapy beginning with the assessment of the client's "interpersonal inventory" of important past and present relationships; develop a case formulation linking depression to grief, interpersonal role disputes, role transitions, and/or interpersonal deficits). Objective Learn and implement problem-solving and decision-making skills. Target Date: 2022-10-30 Frequency: Biweekly  Progress: 50 Modality: individual  Related Interventions Conduct Problem-Solving Therapy using techniques such as psychoeducation, modeling, and role-playing to teach client problem-solving skills (i.e., defining a problem specifically, generating possible solutions, evaluating the pros and cons of each solution, selecting and implementing a plan of action, evaluating the efficacy of the plan, accepting or revising the plan); role-play application of the problem-solving skill to a real life issue. Encourage in the client the development of a positive problem orientation in which problems and solving them are viewed as a natural part of life and not something to be feared, despaired, or avoided. 3. Develop healthy interpersonal relationships that lead to the alleviation and help prevent the relapse of depression. 4. Develop healthy thinking patterns and beliefs about self, others, and the world that lead to the alleviation and help prevent the relapse of depression. 5. Recognize, accept, and cope with feelings of depression. Diagnosis F33.1  Conditions For Discharge Achievement of treatment goals and objectives   Clint Bolder, LCSW

## 2021-12-24 ENCOUNTER — Other Ambulatory Visit: Payer: Self-pay | Admitting: Psychiatry

## 2021-12-24 ENCOUNTER — Other Ambulatory Visit (HOSPITAL_COMMUNITY): Payer: Self-pay

## 2021-12-24 DIAGNOSIS — G2581 Restless legs syndrome: Secondary | ICD-10-CM

## 2021-12-24 MED ORDER — GABAPENTIN 300 MG PO CAPS
1200.0000 mg | ORAL_CAPSULE | Freq: Every day | ORAL | 0 refills | Status: DC
  Filled 2021-12-24: qty 360, 90d supply, fill #0

## 2021-12-25 ENCOUNTER — Other Ambulatory Visit: Payer: Self-pay | Admitting: Psychiatry

## 2021-12-25 ENCOUNTER — Telehealth: Payer: Self-pay | Admitting: Psychiatry

## 2021-12-25 DIAGNOSIS — F331 Major depressive disorder, recurrent, moderate: Secondary | ICD-10-CM

## 2021-12-25 MED ORDER — AUVELITY 45-105 MG PO TBCR
1.0000 | EXTENDED_RELEASE_TABLET | Freq: Two times a day (BID) | ORAL | 0 refills | Status: DC
  Filled 2021-12-25: qty 60, 30d supply, fill #0
  Filled 2022-01-26: qty 60, 30d supply, fill #1

## 2021-12-25 NOTE — Telephone Encounter (Signed)
I sent ER

## 2021-12-25 NOTE — Telephone Encounter (Signed)
Ok to send

## 2021-12-25 NOTE — Telephone Encounter (Addendum)
Pt LVM requesting Rx for Auvelity. Working well, Pt reports. Have been taking samples.  RTC to Pt LVM if out completely of samples? Pt requested 90 day Rx

## 2021-12-26 ENCOUNTER — Other Ambulatory Visit (HOSPITAL_COMMUNITY): Payer: Self-pay

## 2021-12-27 ENCOUNTER — Ambulatory Visit
Admission: RE | Admit: 2021-12-27 | Discharge: 2021-12-27 | Disposition: A | Discharge status: Home / Self Care | Payer: 59 | Source: Ambulatory Visit | Attending: Family Medicine | Admitting: Family Medicine

## 2021-12-27 ENCOUNTER — Other Ambulatory Visit (HOSPITAL_COMMUNITY): Payer: Self-pay

## 2021-12-27 DIAGNOSIS — Z1231 Encounter for screening mammogram for malignant neoplasm of breast: Secondary | ICD-10-CM | POA: Diagnosis not present

## 2022-01-01 ENCOUNTER — Other Ambulatory Visit: Payer: Self-pay | Admitting: Family Medicine

## 2022-01-01 DIAGNOSIS — R928 Other abnormal and inconclusive findings on diagnostic imaging of breast: Secondary | ICD-10-CM

## 2022-01-13 ENCOUNTER — Other Ambulatory Visit: Payer: Self-pay | Admitting: Family Medicine

## 2022-01-13 ENCOUNTER — Other Ambulatory Visit (HOSPITAL_COMMUNITY): Payer: Self-pay

## 2022-01-13 ENCOUNTER — Other Ambulatory Visit: Payer: Self-pay | Admitting: Psychiatry

## 2022-01-13 DIAGNOSIS — F314 Bipolar disorder, current episode depressed, severe, without psychotic features: Secondary | ICD-10-CM

## 2022-01-13 DIAGNOSIS — K219 Gastro-esophageal reflux disease without esophagitis: Secondary | ICD-10-CM

## 2022-01-13 DIAGNOSIS — F4001 Agoraphobia with panic disorder: Secondary | ICD-10-CM

## 2022-01-13 DIAGNOSIS — F411 Generalized anxiety disorder: Secondary | ICD-10-CM

## 2022-01-13 MED ORDER — DEXLANSOPRAZOLE 60 MG PO CPDR
1.0000 | DELAYED_RELEASE_CAPSULE | Freq: Every day | ORAL | 2 refills | Status: DC
  Filled 2022-01-13: qty 30, 30d supply, fill #0
  Filled 2022-02-12: qty 30, 30d supply, fill #1
  Filled 2022-03-17: qty 30, 30d supply, fill #2

## 2022-01-13 MED ORDER — MODAFINIL 200 MG PO TABS
300.0000 mg | ORAL_TABLET | Freq: Every morning | ORAL | 0 refills | Status: DC
  Filled 2022-01-13: qty 135, 90d supply, fill #0

## 2022-01-13 MED ORDER — SERTRALINE HCL 50 MG PO TABS
75.0000 mg | ORAL_TABLET | Freq: Every day | ORAL | 0 refills | Status: DC
  Filled 2022-01-13: qty 135, 90d supply, fill #0

## 2022-01-14 ENCOUNTER — Other Ambulatory Visit (HOSPITAL_COMMUNITY): Payer: Self-pay

## 2022-01-16 ENCOUNTER — Ambulatory Visit (INDEPENDENT_AMBULATORY_CARE_PROVIDER_SITE_OTHER): Payer: 59 | Admitting: Dermatology

## 2022-01-16 ENCOUNTER — Encounter: Payer: Self-pay | Admitting: Dermatology

## 2022-01-16 DIAGNOSIS — L578 Other skin changes due to chronic exposure to nonionizing radiation: Secondary | ICD-10-CM | POA: Diagnosis not present

## 2022-01-16 DIAGNOSIS — B351 Tinea unguium: Secondary | ICD-10-CM

## 2022-01-16 DIAGNOSIS — L814 Other melanin hyperpigmentation: Secondary | ICD-10-CM | POA: Diagnosis not present

## 2022-01-16 DIAGNOSIS — L820 Inflamed seborrheic keratosis: Secondary | ICD-10-CM | POA: Diagnosis not present

## 2022-01-16 DIAGNOSIS — L821 Other seborrheic keratosis: Secondary | ICD-10-CM

## 2022-01-16 DIAGNOSIS — Z1283 Encounter for screening for malignant neoplasm of skin: Secondary | ICD-10-CM | POA: Diagnosis not present

## 2022-01-16 DIAGNOSIS — L603 Nail dystrophy: Secondary | ICD-10-CM

## 2022-01-16 DIAGNOSIS — D229 Melanocytic nevi, unspecified: Secondary | ICD-10-CM | POA: Diagnosis not present

## 2022-01-16 DIAGNOSIS — L219 Seborrheic dermatitis, unspecified: Secondary | ICD-10-CM | POA: Diagnosis not present

## 2022-01-16 DIAGNOSIS — L910 Hypertrophic scar: Secondary | ICD-10-CM | POA: Diagnosis not present

## 2022-01-16 NOTE — Progress Notes (Signed)
Follow-Up Visit   Subjective  Beverly Cole is a 61 y.o. female who presents for the following: Annual Exam (No hx of skin cancer or dysplastic nevi).  The patient presents for Total-Body Skin Exam (TBSE) for skin cancer screening and mole check.  The patient has spots, moles and lesions to be evaluated, some may be new or changing and the patient has concerns that these could be cancer.  The following portions of the chart were reviewed this encounter and updated as appropriate:  Tobacco  Allergies  Meds  Problems  Med Hx  Surg Hx  Fam Hx      Review of Systems: No other skin or systemic complaints except as noted in HPI or Assessment and Plan.   Objective  Well appearing patient in no apparent distress; mood and affect are within normal limits.  A full examination was performed including scalp, head, eyes, ears, nose, lips, neck, chest, axillae, abdomen, back, buttocks, bilateral upper extremities, bilateral lower extremities, hands, feet, fingers, toes, fingernails, and toenails. All findings within normal limits unless otherwise noted below.  right neck x21, left forearm x2, left neck x19, back x7 (49) Erythematous keratotic or waxy stuck-on papule or plaque.  umbilicus Hypertrophic scar  Scalp Pink patches with greasy scale.   Left 4th Toenail Thickened dystrophic nail with subungual debris    Assessment & Plan   Lentigines - Scattered tan macules - Due to sun exposure - Benign-appearing, observe - Recommend daily broad spectrum sunscreen SPF 30+ to sun-exposed areas, reapply every 2 hours as needed. - Call for any changes  Seborrheic Keratoses - Stuck-on, waxy, tan-brown papules and/or plaques  - Benign-appearing - Discussed benign etiology and prognosis. - Observe - Call for any changes  Melanocytic Nevi - Tan-brown and/or pink-flesh-colored symmetric macules and papules - Benign appearing on exam today - Observation - Call clinic for new or  changing moles - Recommend daily use of broad spectrum spf 30+ sunscreen to sun-exposed areas.   Hemangiomas - Red papules - Discussed benign nature - Observe - Call for any changes  Actinic Damage - Chronic condition, secondary to cumulative UV/sun exposure - diffuse scaly erythematous macules with underlying dyspigmentation - Recommend daily broad spectrum sunscreen SPF 30+ to sun-exposed areas, reapply every 2 hours as needed.  - Staying in the shade or wearing long sleeves, sun glasses (UVA+UVB protection) and wide brim hats (4-inch brim around the entire circumference of the hat) are also recommended for sun protection.  - Call for new or changing lesions.  Skin cancer screening performed today.  Inflamed seborrheic keratosis (49) right neck x21, left forearm x2, left neck x19, back x7  Benign-appearing.  Observation.  Call clinic for new or changing lesions.   Symptomatic, irritating, patient would like treated.  Prior to procedure, discussed risks of blister formation, small wound, skin dyspigmentation, or rare scar following cryotherapy. Recommend Vaseline ointment to treated areas while healing.   Destruction of lesion - right neck x21, left forearm x2, left neck x19, back x7  Destruction method: cryotherapy   Informed consent: discussed and consent obtained   Timeout:  patient name, date of birth, surgical site, and procedure verified Lesion destroyed using liquid nitrogen: Yes   Region frozen until ice ball extended beyond lesion: Yes   Outcome: patient tolerated procedure well with no complications   Post-procedure details: wound care instructions given   Additional details:  Prior to procedure, discussed risks of blister formation, small wound, skin dyspigmentation, or rare scar  following cryotherapy. Recommend Vaseline ointment to treated areas while healing.   Hypertrophic scar umbilicus  Fluorouracil 5%/Triamcinolone '20mg'$  injection 1:1 ratio  Intralesional  injection - umbilicus Location: inferior umbilicus  Informed Consent: Discussed risks (infection, pain, bleeding, bruising, thinning of the skin, loss of skin pigment, lack of resolution, and recurrence of lesion) and benefits of the procedure, as well as the alternatives. Informed consent was obtained. Preparation: The area was prepared a standard fashion.  Anesthesia:  Procedure Details: An intralesional injection was performed with Kenalog 20 mg/cc, 5-fluorouracil 1:1 ratio. 1.1 cc in total were injected.  Total number of injections: 1  Plan: The patient was instructed on post-op care. Recommend OTC analgesia as needed for pain.  [place]: 3009-2330-07 (kenalog) Exp: 03/2022 Lot: [id number]  NDC: 63323-17-51 (5FU) Exp: 12/2022 Lot: [id number]  Seborrheic dermatitis Scalp  Patient deferred treatment at this time.   Nail dystrophy Left 4th Toenail  She was previously treated with PO fluconazole and has also failed topical therapy.   Discussed could be resistant fungus or could be secondary to trauma or inflammatory condition  QLABS PCR performed today.     Return in about 4 months (around 05/18/2022) for ISK Follow Up, TBSE in 1 year.  I, Emelia Salisbury, CMA, am acting as scribe for Forest Gleason, MD.  Documentation: I have reviewed the above documentation for accuracy and completeness, and I agree with the above.  Forest Gleason, MD

## 2022-01-16 NOTE — Patient Instructions (Addendum)
Cryotherapy Aftercare  Wash gently with soap and water everyday.   Apply Vaseline daily until healed.    Recommend taking Heliocare sun protection supplement daily in sunny weather for additional sun protection. For maximum protection on the sunniest days, you can take up to 2 capsules of regular Heliocare OR take 1 capsule of Heliocare Ultra. For prolonged exposure (such as a full day in the sun), you can repeat your dose of the supplement 4 hours after your first dose. Heliocare can be purchased at  [facility], at some [facility] or at www.heliocare.com.     Recommend daily broad spectrum sunscreen SPF 30+ to sun-exposed areas, reapply every 2 hours as needed. Call for new or changing lesions.  Staying in the shade or wearing long sleeves, sun glasses (UVA+UVB protection) and wide brim hats (4-inch brim around the entire circumference of the hat) are also recommended for sun protection.      Melanoma ABCDEs  Melanoma is the most dangerous type of skin cancer, and is the leading cause of death from skin disease.  You are more likely to develop melanoma if you: Have light-colored skin, light-colored eyes, or red or blond hair Spend a lot of time in the sun Tan regularly, either outdoors or in a tanning bed Have had blistering sunburns, especially during childhood Have a close family member who has had a melanoma Have atypical moles or large birthmarks  Early detection of melanoma is key since treatment is typically straightforward and cure rates are extremely high if we catch it early.   The first sign of melanoma is often a change in a mole or a new dark spot.  The ABCDE system is a way of remembering the signs of melanoma.  A for asymmetry:  The two halves do not match. B for border:  The edges of the growth are irregular. C for color:  A mixture of colors are present instead of an even brown color. D for diameter:  Melanomas are usually (but not always) greater than 6mm -  the size of a pencil eraser. E for evolution:  The spot keeps changing in size, shape, and color.  Please check your skin once per month between visits. You can use a small mirror in front and a large mirror behind you to keep an eye on the back side or your body.   If you see any new or changing lesions before your next follow-up, please call to schedule a visit.  Please continue daily skin protection including broad spectrum sunscreen SPF 30+ to sun-exposed areas, reapplying every 2 hours as needed when you're outdoors.   Staying in the shade or wearing long sleeves, sun glasses (UVA+UVB protection) and wide brim hats (4-inch brim around the entire circumference of the hat) are also recommended for sun protection.     Due to recent changes in healthcare laws, you may see results of your pathology and/or laboratory studies on MyChart before the doctors have had a chance to review them. We understand that in some cases there may be results that are confusing or concerning to you. Please understand that not all results are received at the same time and often the doctors may need to interpret multiple results in order to provide you with the best plan of care or course of treatment. Therefore, we ask that you please give us 2 business days to thoroughly review all your results before contacting the office for clarification. Should we see a critical lab result, you will   be contacted sooner.   If You Need Anything After Your Visit  If you have any questions or concerns for your doctor, please call our main line at [phone] and press option 4 to reach your doctor's medical assistant. If no one answers, please leave a voicemail as directed and we will return your call as soon as possible. Messages left after 4 pm will be answered the following business day.   You may also send us a message via MyChart. We typically respond to MyChart messages within 1-2 business days.  For prescription refills,  please ask your pharmacy to contact our office. Our fax number is [phone].  If you have an urgent issue when the clinic is closed that cannot wait until the next business day, you can page your doctor at the number below.    Please note that while we do our best to be available for urgent issues outside of office hours, we are not available 24/7.   If you have an urgent issue and are unable to reach us, you may choose to seek medical care at your doctor's office, retail clinic, urgent care center, or emergency room.  If you have a medical emergency, please immediately call 911 or go to the emergency department.  Pager Numbers  - Dr. Kowalski: [phone]  - Dr. Moye: [phone]  - Dr. Stewart: [phone]  In the event of inclement weather, please call our main line at [phone] for an update on the status of any delays or closures.  Dermatology Medication Tips: Please keep the boxes that topical medications come in in order to help keep track of the instructions about where and how to use these. Pharmacies typically print the medication instructions only on the boxes and not directly on the medication tubes.   If your medication is too expensive, please contact our office at [phone] option 4 or send us a message through MyChart.   We are unable to tell what your co-pay for medications will be in advance as this is different depending on your insurance coverage. However, we may be able to find a substitute medication at lower cost or fill out paperwork to get insurance to cover a needed medication.   If a prior authorization is required to get your medication covered by your insurance company, please allow us 1-2 business days to complete this process.  Drug prices often vary depending on where the prescription is filled and some pharmacies may offer cheaper prices.  The website www.goodrx.com contains coupons for medications through different pharmacies. The prices  here do not account for what the cost may be with help from insurance (it may be cheaper with your insurance), but the website can give you the price if you did not use any insurance.  - You can print the associated coupon and take it with your prescription to the pharmacy.  - You may also stop by our office during regular business hours and pick up a GoodRx coupon card.  - If you need your prescription sent electronically to a different pharmacy, notify our office through [facility] MyChart or by phone at [phone] option 4.     Si Usted Necesita Algo Despus de Su Visita  Tambin puede enviarnos un mensaje a travs de MyChart. Por lo general respondemos a los mensajes de MyChart en el transcurso de 1 a 2 das hbiles.  Para renovar recetas, por favor pida a su farmacia que se ponga en contacto con nuestra oficina. Nuestro nmero de fax   es el [phone].  Si tiene un asunto urgente cuando la clnica est cerrada y que no puede esperar hasta el siguiente da hbil, puede llamar/localizar a su doctor(a) al nmero que aparece a continuacin.   Por favor, tenga en cuenta que aunque hacemos todo lo posible para estar disponibles para asuntos urgentes fuera del horario de oficina, no estamos disponibles las 24 horas del da, los 7 das de la semana.   Si tiene un problema urgente y no puede comunicarse con nosotros, puede optar por buscar atencin mdica  en el consultorio de su doctor(a), en una clnica privada, en un centro de atencin urgente o en una sala de emergencias.  Si tiene una emergencia mdica, por favor llame inmediatamente al 911 o vaya a la sala de emergencias.  Nmeros de bper  - Dr. Kowalski: [phone]  - Dra. Moye: [phone]  - Dra. Stewart: [phone]  En caso de inclemencias del tiempo, por favor llame a nuestra lnea principal al [phone] para una actualizacin sobre el estado de cualquier retraso o cierre.  Consejos para la medicacin en  dermatologa: Por favor, guarde las cajas en las que vienen los medicamentos de uso tpico para ayudarle a seguir las instrucciones sobre dnde y cmo usarlos. Las farmacias generalmente imprimen las instrucciones del medicamento slo en las cajas y no directamente en los tubos del medicamento.   Si su medicamento es muy caro, por favor, pngase en contacto con nuestra oficina llamando al [phone] y presione la opcin 4 o envenos un mensaje a travs de MyChart.   No podemos decirle cul ser su copago por los medicamentos por adelantado ya que esto es diferente dependiendo de la cobertura de su seguro. Sin embargo, es posible que podamos encontrar un medicamento sustituto a menor costo o llenar un formulario para que el seguro cubra el medicamento que se considera necesario.   Si se requiere una autorizacin previa para que su compaa de seguros cubra su medicamento, por favor permtanos de 1 a 2 das hbiles para completar este proceso.  Los precios de los medicamentos varan con frecuencia dependiendo del lugar de dnde se surte la receta y alguna farmacias pueden ofrecer precios ms baratos.  El sitio web www.goodrx.com tiene cupones para medicamentos de diferentes farmacias. Los precios aqu no tienen en cuenta lo que podra costar con la ayuda del seguro (puede ser ms barato con su seguro), pero el sitio web puede darle el precio si no utiliz ningn seguro.  - Puede imprimir el cupn correspondiente y llevarlo con su receta a la farmacia.  - Tambin puede pasar por nuestra oficina durante el horario de atencin regular y recoger una tarjeta de cupones de GoodRx.  - Si necesita que su receta se enve electrnicamente a una farmacia diferente, informe a nuestra oficina a travs de MyChart de [place] o por telfono llamando al [phone] y presione la opcin 4.  

## 2022-01-17 ENCOUNTER — Ambulatory Visit
Admission: RE | Admit: 2022-01-17 | Discharge: 2022-01-17 | Disposition: A | Discharge status: Home / Self Care | Payer: 59 | Source: Ambulatory Visit | Attending: Family Medicine | Admitting: Family Medicine

## 2022-01-17 DIAGNOSIS — R928 Other abnormal and inconclusive findings on diagnostic imaging of breast: Secondary | ICD-10-CM

## 2022-01-17 DIAGNOSIS — R921 Mammographic calcification found on diagnostic imaging of breast: Secondary | ICD-10-CM | POA: Diagnosis not present

## 2022-01-20 ENCOUNTER — Telehealth: Payer: Self-pay

## 2022-01-20 ENCOUNTER — Encounter: Payer: Self-pay | Admitting: Dermatology

## 2022-01-20 MED ORDER — CLOBETASOL PROPIONATE 0.05 % EX OINT
TOPICAL_OINTMENT | CUTANEOUS | 3 refills | Status: DC
  Filled 2022-01-20: qty 30, 30d supply, fill #0

## 2022-01-20 NOTE — Telephone Encounter (Signed)
[facility] fax scanned in under the media tab for your review

## 2022-01-20 NOTE — Addendum Note (Signed)
Addended by: Ruthell Rummage A on: 01/20/2022 05:01 PM   Modules accepted: Orders

## 2022-01-20 NOTE — Telephone Encounter (Signed)
Called patient regarding culture results. Patient verbalized understanding. Patient denies any joint pain. Patient would like to start clobetasol ointment to use on weekends only for a couple of months.  Patient request rx be sent to [place].

## 2022-01-20 NOTE — Telephone Encounter (Signed)
PCR shows no fungus or bacteria at her nail. Changes are likely either due to trauma from rubbing at shoe or from an inflammatory condition such as psoriasis or lichen planus. Suggest being sure that shoes are not tight or rubbing over that nail.   Please ask her about joint pain and let me know if she has any.  We can try clobetasol ointment to nail and proximal nail fold (the skin right next to the nail closest to the foot) daily on weekend days for a couple of months to see if she has improvement in the nail. It can thin or lighten the skin, so she would need to watch for that.

## 2022-01-20 NOTE — Telephone Encounter (Signed)
LVM for pt to call for culture results, JS

## 2022-01-21 ENCOUNTER — Other Ambulatory Visit (HOSPITAL_COMMUNITY): Payer: Self-pay

## 2022-01-21 NOTE — Telephone Encounter (Signed)
Please send rx. Thank you!

## 2022-01-26 ENCOUNTER — Other Ambulatory Visit: Payer: Self-pay | Admitting: Family Medicine

## 2022-01-26 DIAGNOSIS — R251 Tremor, unspecified: Secondary | ICD-10-CM

## 2022-01-26 DIAGNOSIS — I10 Essential (primary) hypertension: Secondary | ICD-10-CM

## 2022-01-27 ENCOUNTER — Other Ambulatory Visit (HOSPITAL_COMMUNITY): Payer: Self-pay

## 2022-01-27 ENCOUNTER — Other Ambulatory Visit: Payer: Self-pay

## 2022-01-27 MED ORDER — PROPRANOLOL HCL 20 MG PO TABS
20.0000 mg | ORAL_TABLET | Freq: Two times a day (BID) | ORAL | 0 refills | Status: DC
  Filled 2022-01-27: qty 60, 30d supply, fill #0

## 2022-01-27 MED ORDER — DILTIAZEM HCL ER COATED BEADS 180 MG PO CP24
180.0000 mg | ORAL_CAPSULE | Freq: Every day | ORAL | 0 refills | Status: DC
  Filled 2022-01-27 – 2022-02-04 (×2): qty 30, 30d supply, fill #0

## 2022-02-04 ENCOUNTER — Other Ambulatory Visit (HOSPITAL_COMMUNITY): Payer: Self-pay

## 2022-02-13 ENCOUNTER — Other Ambulatory Visit (HOSPITAL_COMMUNITY): Payer: Self-pay

## 2022-02-19 ENCOUNTER — Encounter: Payer: Self-pay | Admitting: Psychiatry

## 2022-02-19 ENCOUNTER — Telehealth (INDEPENDENT_AMBULATORY_CARE_PROVIDER_SITE_OTHER): Payer: 59 | Admitting: Psychiatry

## 2022-02-19 ENCOUNTER — Other Ambulatory Visit (HOSPITAL_COMMUNITY): Payer: Self-pay

## 2022-02-19 DIAGNOSIS — F423 Hoarding disorder: Secondary | ICD-10-CM

## 2022-02-19 DIAGNOSIS — R251 Tremor, unspecified: Secondary | ICD-10-CM

## 2022-02-19 DIAGNOSIS — G250 Essential tremor: Secondary | ICD-10-CM | POA: Diagnosis not present

## 2022-02-19 DIAGNOSIS — G2581 Restless legs syndrome: Secondary | ICD-10-CM | POA: Diagnosis not present

## 2022-02-19 DIAGNOSIS — F4001 Agoraphobia with panic disorder: Secondary | ICD-10-CM | POA: Diagnosis not present

## 2022-02-19 DIAGNOSIS — F40240 Claustrophobia: Secondary | ICD-10-CM

## 2022-02-19 DIAGNOSIS — F331 Major depressive disorder, recurrent, moderate: Secondary | ICD-10-CM | POA: Diagnosis not present

## 2022-02-19 DIAGNOSIS — F411 Generalized anxiety disorder: Secondary | ICD-10-CM | POA: Diagnosis not present

## 2022-02-19 DIAGNOSIS — F314 Bipolar disorder, current episode depressed, severe, without psychotic features: Secondary | ICD-10-CM

## 2022-02-19 MED ORDER — MODAFINIL 200 MG PO TABS
300.0000 mg | ORAL_TABLET | Freq: Every morning | ORAL | 0 refills | Status: DC
  Filled 2022-02-19 – 2022-04-10 (×2): qty 135, 90d supply, fill #0

## 2022-02-19 MED ORDER — SERTRALINE HCL 50 MG PO TABS
75.0000 mg | ORAL_TABLET | Freq: Every day | ORAL | 0 refills | Status: DC
  Filled 2022-02-19 – 2022-04-10 (×2): qty 135, 90d supply, fill #0

## 2022-02-19 MED ORDER — OLANZAPINE 10 MG PO TABS
10.0000 mg | ORAL_TABLET | Freq: Every day | ORAL | 0 refills | Status: DC
  Filled 2022-02-19: qty 90, 90d supply, fill #0

## 2022-02-19 MED ORDER — AUVELITY 45-105 MG PO TBCR
1.0000 | EXTENDED_RELEASE_TABLET | Freq: Two times a day (BID) | ORAL | 0 refills | Status: DC
  Filled 2022-02-19 – 2022-03-07 (×6): qty 180, 90d supply, fill #0

## 2022-02-19 MED ORDER — PROPRANOLOL HCL 20 MG PO TABS
20.0000 mg | ORAL_TABLET | Freq: Two times a day (BID) | ORAL | 0 refills | Status: DC
  Filled 2022-02-19 – 2022-03-06 (×2): qty 180, 90d supply, fill #0

## 2022-02-19 MED ORDER — GABAPENTIN 600 MG PO TABS
1200.0000 mg | ORAL_TABLET | Freq: Every evening | ORAL | 0 refills | Status: DC
  Filled 2022-02-19: qty 180, 90d supply, fill #0

## 2022-02-19 NOTE — Progress Notes (Addendum)
[name] [id number] 1960/04/19 62 y.o.  Video Visit via My Chart  I connected with pt by video using My Chart and verified that I am speaking with the correct person using two identifiers.   I discussed the limitations, risks, security and privacy concerns of performing an evaluation and management service by My Chart  and the availability of in person appointments. I also discussed with the patient that there may be a patient responsible charge related to this service. The patient expressed understanding and agreed to proceed.  I discussed the assessment and treatment plan with the patient. The patient was provided an opportunity to ask questions and all were answered. The patient agreed with the plan and demonstrated an understanding of the instructions.   The patient was advised to call back or seek an in-person evaluation if the symptoms worsen or if the condition fails to improve as anticipated.  I provided 30 minutes of video time during this encounter.  The patient was located at home and the provider was located office. Session 400-430 pm  Subjective:   Patient ID:  Beverly Cole is a 62 y.o. (DOB Jan 23, 1961) female.  Chief Complaint:  Chief Complaint  Patient presents with   Follow-up   Depression   Anxiety   Fatigue    Depression        Associated symptoms include decreased concentration and fatigue.  Associated symptoms include no suicidal ideas.  Past medical history includes anxiety.   Anxiety Symptoms include decreased concentration, dizziness, nervous/anxious behavior and shortness of breath. Patient reports no chest pain, confusion or suicidal ideas.     [name]  today for follow-up of chronic depression and anxiety.  Lately depression worse than anxiety.  Seen with husband today  When seen May 11, 2018.  For persistent anxiety we elected to retry buspirone and try increasing it to 30 mg twice daily if tolerated.   Couldn't tolerate it this time DT  muscle spasms.  Pharmacy called saying she could have serotonin syndrome.  When seen August 09, 2018 and she refused med changes despite chronic anxiety and depression. She remained on sertraline 50, Depakote ER 1000 mg, Wellbutrin SR 200 mg AM  Panic wearing cloth masks.  Using a shield.  Not in the office today bc H exposed to Covid.    seen December 09, 2018.  Because of chronic depression and dysfunction with fatigue and poor motivation we decided off label trial  trial of modafinil 200 mg 1/2 each am for 1 week then 200 mg each AM. Did see benefit from it.  More energy and working to stay out of bed more.    seen January 10, 2019.  The modafinil had been helpful.  No meds were changed.  seen April 07, 2019.  The following was noted: Still sees benefit from modafinil but still not caring for herself like she should.  Not wanting to go anywhere.  Not driven since early fall.  Hard to breathe with the mask and it creates anxiety.  Hard to be in enclosed spaces like cars and planes for extended periods.   Trying to set her alarm clock.  No hallucinations.  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  Plans to send more too.  Still follow through is not great.  Still depression and productivity is still poor but it is not as poor as it was before modafinil. Plan increase  modafinil to 300 mg daily to see if function can be improved.  06/29/2019 appointment, the following is noted: Rare Xanax.  Did increase modafinil to 300 mg daily. Saw benefit with the increase with less time in bed but still markedly functionally impaired.   It seems like I get used to it and then doesn't maintain her get up and go.  Still having a hard time with self care like hygiene. Chronic low motivation and lack of interest is unchanged.   Gall bladder problems.  It's better at the moment.    Not more sad, just like I've always been.  Can't make herself  leave the house DT depression and anxiety.  She won't do chores.   Less excess sleeping.  He works from home 2 days weekly.  No periods of hyperactivity or manic sx since the spring. Attending therapy every 2 weeks.   Can be confused when first wakes up regardless of time of day. Had episode of hallucination in the middle of the night when awakened. Scarecrow frightened her. This is rare event.  No hallucinations during the day.  Melatonin makes it worse. Has had fearful thoughts of planes crashing or bad things happening to others and it might be her fault.  Fight with brother lately and not speaking with him now. Plan:  No med changes  10/11/19 appt with the following noted: Unsteady and tremors gotten worse.  Not gone to doctor. Propranolol not helping as much.   On Depakote ER '1000mg'$  HS which is a reduction. So tired all the time and H says she's not doing anything.  H says accomplishing littte and still lays in bed a lot.  H says it's 2-3 PM before she gets OOB.  Memory is poor.  Everything is such a chore and doesn't want to do things. H says she starts things she doesn't finish.  Lots of new projects.   Increase modafinil on her own to 400 mg daily in the AM.  Anxious.  Rare Xanax.  Was happy when got new cats in the summer.  Anxious and Depressed and describes anxiety as Moderate. Anxiety symptoms include: Excessive Worry,.  She thinks sertraline reduces her obsessiveness.  Past hx panic so avoidant. Never get to relax.  Pt reports sleeps excessively even with modafinil. Pt reports that appetite is good. Pt reports that energy is poor and loss of interest or pleasure in usual activities, poor motivation and withdrawn from usual activities. Just don't care about things and admits to a lot of general anxiety and everything takes a lot of energy out of her.  Compulsively tapes and watches TV shows, news and awards shows.  Can't delete it bc I'll miss something.  Watches TV in bed. Concentration is  poor. Suicidal thoughts:  denied by patient. But chronic death thoughts.  Poor productivity overall.  Chronic poor self care, showers only weekly.  Just don't care.  Chronic disability.  She thinks sertraline helped the anxiety some.  Likes that H is at home.  Helps her mood.  Doesn't care about going out. Feels faint when she tries to wear a mask.  Too anxious driving and is avoidant. Collects TV shows, she can't erase unless she watches entirely. Hoards some bc afraid she might need it some day.   Rare use of Xanax bc fears addiction. Plan: Attempted referral to neurology at [facility] neurology.  They refused to see patient stating they had nothing to offer her. It was suggested to patient that she  try to get in with Dr. Jannifer Franklin whom she had seen in the past.  01/10/2020 appointment with the following noted: Reduced modafinil to 300 mg bc didn't want to get hooked on anything about 2-3 weeks.  Did initially see benefit from the modafinil but seemed to get tolerant to it. Feels better not talking to brother bc differing views of politics.  Feels he's a negative person but never depressed. Overall thinks she's doing pretty well relatively.  However is chronically depressed and never happy.  Nothing changed with the meds. Depression worse than anxiety. H didn't think modafinil made much difference.  H CO her inactivity.. She felt it was helpful for energy initially. Had to cancel trips bc can't wear a mask for that long.  Makes her sad. Driven twice in a year or so DT anxiety. Wants prn Xanax. Plan: DC sertraline Start fluoxetine 20 mg daily with olanzapine 5 mg daily.  05/22/2020 appointment with following noted: Didn't remember to stop sertraline and took fluoxetine and olanzapine for 2 days and couldn't eat and stopped it. Then went to dermatologist December who told her couldn't take Zoloft with fungus med and stopped sertraline. Then became more depressed.  I have to go back on the  Zoloft. Remained on modafinil, Wellbutrin, Depakote.  Even less motivated to do anything like cook, clean.  Won't go to [employer] etc. Chronic depression with hopelessness to get better Plan: No med changes  07/31/2020 appointment with the following noted: Back on sertraline at 75 mg daily with Remained on modafinil, Wellbutrin, Depakote.  Aunt died and one of worst fears but not thinking about it. Family got Covid but she recovered.  She thought it might kill and didn't care. Tremors are getting worse and balance problems. Went to concert and that was a problem.  Enjoyed the concert. Also saw Kathrene Bongo.  Music always important to her.  1980 had planned to commit suicide listening to an album over and over but didn't attempt. B and M have familial tremor also. Still anxious and depressed and haven't driven in a year DT anxiety. Plan: Option of trial olanzapine alone without fluoxetine.  Rec retry.  She agrees.  10/23/2020 appointment with the following noted: "Olanzapine is really working."  Initial SE drunk at 5 mg HS so cut it in half.  Got used to it but I feel happy and more relaxed and easier time going to sleep.  If I could still chose to die I would but clearly happier but not more hopeful.  Laughing more.  No SI. Needs to care about things more.  Chronic lack of motivations. Still doesn't want to leave the house but did enjoy a concert in [place]. Chronic unsteadiness is worse.  Last disc with doctor in March who rec PT but she never did the PT.  Misses aunt who died.   She reduced the Depakote to 500 mg HS. When stopped sertraline was not doing well and restarted it bc stressed and unhappy.  Seems to be a key med for me. Pending card test soon.  Feels better than in the past. RLS is worse after starting olanzapine and even affecting arms but had it before.  Happens after lays down at night.  Takes MG which helps.   Xanax about once every 3 weeks. Plan: Successful trial  olanzapine 2.5 mg with sertraline 50 but worse RLS Trial gabapentin for RLS 100-300 mg PM and disc SE.  Higher if needed..  Once controlled consider increase olanzapine.  01/23/2021 appointment with the following noted: Taking gabapentin 300 mg but needs to go higher for RLS.  SE dizzy and reduced concentration. Reads on internet.   Increased olanzapine to 5 mg HS a couple of mos ago and feels better with it.  Happier.  More relaxed.  Only think that's helped in a long time. Handwriting is so much better, less shakey. Has RLS and can be bad when she lays down. Got a new kitten and enjoys it.  Now has 4 cats. Afraid to try new meds after negative reaction to Vraylar.  Afraid of any med change.  Afraid of having SI and afraid she'd do it if had SI.   Saw pulmonologist over weakness and he says she's OK except deconditioned.  Can't tolerate wearing the masks. Plan: Successful trial olanzapine 5 mg with sertraline 50 for depression and anxiety but worse RLS Consider increasing the olanzapine later if RLS can be managed. increase gabapentin for RLS 400-600 mg PM and disc SE.  Higher if needed..  Once controlled consider increase olanzapine.  03/28/2021 appointment with the following noted: Uses Ivory soap behind knee with benefit.  Increased gabapentin to 600 mg PM.  Dizziness resolved which occurred when turned over in bed.  Not a problem. RLS still 5/7 days.  But had no vacation RLS bc was walking more.  Not active at home. Sleep is great 8-9 hours and best in a long time. Enjoyed expensive vacation to [facility].  Once in a lifetime thing.  Some things she didn't like for example the cost. Went to [employer] concert and others. Depression is better.  Olanzapine really helped.  Didn't expect it.   Hasn't felt this good in years and H notices.   Caffeine 2 cups hot tea and limited. Plan: Successful trial olanzapine 5 mg with sertraline 50 for depression and anxiety but worse RLS Consider increasing  the olanzapine later if RLS can be managed. increase gabapentin for ok to increase RLS 600-900 mg PM and disc SE.  Higher if needed..  Once controlled consider increase olanzapine.  05/27/2021 phone call wanting to increase olanzapine to see if it will further help depression.  She had increased it on her own to 10 mg for a couple of weeks.  She has to increase to 15 mg.  This was agreed  06/26/21 appt noted: On sertraline 50 and olanzapine 15 mg HS with 1200 mg PM gabapentin for RLS RLS kicks in about 4 hours after olanzapine.  Prefers to stay up late bc doesn't really want to go to sleep.  RLS 5 days per week. Sleep 8 hours. Caffeine 2 cups hot tea and limited. Benefit from increase olanzapine. Helped anxiety and able to go to sleep earlier than usual.   Before trip was happier than I've been in years.  Not real happy with trip and now nothing to look forward to.   SE some dizziness with gabapentin.  But it's better.   Mood still better than before olanzapine. Plan: benefit olanzapine 15 mg with sertraline 50 for depression and anxiety but worse RLS She wants to Consider increasing the olanzapine later if RLS can be managed. OK increase olanzapine to 20 mg PM If RLS is worse then will have to reduce.  08/26/2021 appointment with the following noted: Increased olanzapine 20 mg HS.  Not sure if it made a difference.  Terrible time with memorfy ongoing. Sleep good.  Still has RLS and can get pain in her arm including yesterday at  6-7 PM.   Asks about Ozempic for OCD. Depends on H [name]. Anxiety is better and that helps sleep.  Depression better than it was.  No longer thinks of death all the time like she use to.  Still not very active.  Need something to look forward to. Stays up late and likes doing so bc less depressed in evening.   Plan continue olanzapine 20 mg daily with sertraline 75 mg daily  11/26/2021 appointment noted:  seen with Sherren Kerns Memory is poor.  Forgets TV shows. Taking  alprazolam rarely. Overall thinks she is doing fine.  Still not driving much.  Don't like to leave the house but went outside on the deck some which is improvement. Sleeps 12 hours nightly but then up all day. Happier with olanazapine and thinks the higher doses did help the anxiety. H doesn't see any changes in the last year.  Covid messed her up and she has become housebound.  Not social.  She got used to being inside and no motivation to change it. H cleans and prepares meals.  Doesn't have motivation for chores. Plan: continue sertraline 50 for depression and anxiety  She wants to Consider increasing the olanzapine later if RLS can be managed. Reduce olanzapine to 10 mg HS bc unclear if higher dose helped more than lower. RLS is better with MG Modafinil 300 to 400 mg daily for alertness and off label for treatment resistant depression Trial Auvelity for TRD 1 in AM for 1 week, then 1 twice daily. DC Wellbutriin  12/25/2021 phone call requesting 90-day prescription of Auvelity stating that it was working well.  02/19/22 appt noted: Current psych med: rare Xanax, Auvelity BID, no fluoxetine, modafinil 300 mg AM , olanzapine 10 pm, propranolol 20 BID, sertraline 75 mg daily. Gabapentin 1200 mg daily I feel like doing things more and I see things more clearly.  Realizes now buying things to make myself feel happier and not really a buying problem per se.  Now not driven to buy things she shouldn't.   Feels embarrassed by what she did before with this.  Had bought a lot of expensive baskets and now no longer does that.  She is happier she's better and H notices it too.  Putting things away and better cleaning.  Tired a lot.  Better motivation..  had lunch with friends scheduled for Friday but is cancelling.  Not sure whay she is avoidant socially.  Anxious about it.  Almost fears a panic attack.  Not sure when had last panic but had them when she tried to go to work.  I can't.  Doesn't drive often.   Has a Lucianne Lei but doesn't like driving it.  Can't drive at night.   Still sleeps 12 hours in 24.   Happy the way I am right now. Some trouble with restless legs.  Past psych med trials:  Wellbutrin XL 300, Sertraline 75, fluoxetine SE brief , Auvelity Vraylar SI, Latuda, Abilify , olanzapine 20 Symbyax side effects Seroquel which caused side effects of constipation,   lamotrigine,  refuses lithium because of altered taste.   pramipexole,  Modafinil 300-400  Hx primidone sed  She has a history of ataxia on 1500 mg of Depakote.   buspirone SE hallucinations Gabapentin for RLS  ECT 2016 without help,   Review of Systems:  Review of Systems  Constitutional:  Positive for fatigue.  Respiratory:  Positive for shortness of breath.   Cardiovascular:  Negative for chest pain.  Gastrointestinal:  Positive for abdominal pain. Negative for vomiting.  Musculoskeletal:  Positive for gait problem.  Neurological:  Positive for dizziness and tremors. Negative for weakness.       RLS  Psychiatric/Behavioral:  Positive for decreased concentration, depression, dysphoric mood and sleep disturbance. Negative for agitation, behavioral problems, confusion, hallucinations, self-injury and suicidal ideas. The patient is nervous/anxious. The patient is not hyperactive.     Medications: I have reviewed the patient's current medications.  Current Outpatient Medications  Medication Sig Dispense Refill   ALPRAZolam (XANAX) 0.5 MG tablet TAKE 1 TABLET (0.5 MG TOTAL) BY MOUTH 2 (TWO) TIMES DAILY AS NEEDED FOR ANXIETY. TAKE 1 TABLET 30 MINUTES PRIOR TO PROCEDURE 30 tablet 0   aspirin EC 325 MG tablet Take 325 mg by mouth daily.     clobetasol ointment (TEMOVATE) 0.05 % Apply topically to nail, proximal nail fold daily on weekends only. Avoid applying to face, groin, and axilla. Use as directed. 30 g 3   dexlansoprazole (DEXILANT) 60 MG capsule Take 1 capsule (60 mg total) by mouth daily. 30 capsule 2    diltiazem (CARTIA XT) 180 MG 24 hr capsule Take 1 capsule (180 mg total) by mouth daily. 30 capsule 0   fexofenadine (ALLEGRA ALLERGY) 180 MG tablet Take 1 tablet (180 mg total) by mouth daily.     fluticasone (FLONASE) 50 MCG/ACT nasal spray Place 2 sprays into the nose daily as needed. For allergies     gabapentin (NEURONTIN) 600 MG tablet Take 2 tablets (1,200 mg total) by mouth every evening. 180 tablet 0   lidocaine (XYLOCAINE) 5 % ointment Apply a pea sized amount topically 20 minutes prior to intercourse, wipe off just prior to intercourse. 30 g 1   NONFORMULARY OR COMPOUNDED ITEM sm Azelaic acid/ metronidazole/ivermectin 15%/1%/1% cream apply to face bid 1 each 5   Probiotic Product (PROBIOTIC DAILY PO) Take 1 tablet by mouth daily.      tretinoin (RETIN-A) 0.025 % cream Apply topically at bedtime. 45 g 4   trimethoprim (TRIMPEX) 100 MG tablet take as directed after intercourse 30 tablet 2   Dextromethorphan-buPROPion ER (AUVELITY) 45-105 MG TBCR Take 1 tablet by mouth 2 (two) times daily. 180 tablet 0   modafinil (PROVIGIL) 200 MG tablet Take 1.5 tablets (300 mg total) by mouth in the morning. 135 tablet 0   OLANZapine (ZYPREXA) 10 MG tablet Take 1 tablet (10 mg total) by mouth at bedtime. 90 tablet 0   propranolol (INDERAL) 20 MG tablet Take 1 tablet (20 mg total) by mouth 2 (two) times daily. 180 tablet 0   sertraline (ZOLOFT) 50 MG tablet Take 1.5 tablets (75 mg total) by mouth daily. 135 tablet 0   Sod Picosulfate-Mag Ox-Cit Acd (CLENPIQ) 10-3.5-12 MG-GM -GM/175ML SOLN Use as directed per office and not instructions on packaging. Do not refrigerate. 350 mL 0   No current facility-administered medications for this visit.    Medication Side Effects: None  Allergies:  Allergies  Allergen Reactions   Cariprazine Other (See Comments)    Patient becomes suicidal when taking this medication.  Patient becomes suicidal when taking this medication.    Metoprolol Other (See Comments)     Hallucinations hair falls out Hallucinations hair falls out   Nickel Rash   Iron Nausea And Vomiting    Past Medical History:  Diagnosis Date   Anxiety    Atrial fibrillation ([place])    a. Event monitor 2013 - PACs/bradycardia/SVT/short run of atrial fib by event monitor.  BIPOLAR DISORDER UNSPECIFIED    Bradycardia    Bursitis of hip 09/1999   Bilateral - Dr. Berenice Primas   DEPRESSION    Emphysema lung ([place]) 06/20/2016   pt states pulmonalongist stated started recently   GERD    Headache(784.0)    was with menstrual cycle. no longer a problem   HYPERLIPIDEMIA    Hypertension    Hypertension    IRRITABLE BOWEL SYNDROME, HX OF    Menopausal state 01/2012   [facility] = 88.5   Mitral valve prolapse 12/17/2000   a. dx 1980s, most recent echo did not demonstrate this.   MYALGIA    Normal coronary arteries    a. by cardiac CT 2013.   PAT (paroxysmal atrial tachycardia)    PE (pulmonary embolism) 02/09/2015   a. Bilateral PEs 01/2015 when d-dimer 0.69, CP lying on left side. Followed by heme-onc - + lupus anticoagulant, mildly depressed protein S. Dr. Marin Olp is not certain if her hypercoagulable studies are significant for a thrombophilic state.   Premature atrial contractions    Pulmonary embolus ([facility]) 02/10/2015   Shortness of breath    Pulmonary eval 05/2002   Thyroid nodule 2014   Tremors of nervous system     Family History  Problem Relation Age of Onset   Lung cancer Mother    Hypertension Mother    Hyperlipidemia Mother    Cancer Father        Esophageal cancer   Hyperlipidemia Father    Depression Father    Cancer Brother    Pulmonary embolism Brother    Colon cancer Paternal Uncle    Sarcoidosis Other    Testicular cancer Other     Social History   Socioeconomic History   Marital status: Married    Spouse name: Not on file   Number of children: 0   Years of education: Not on file   Highest education level: Not on file  Occupational History   Occupation:  Nurse-Personal Care/HH    Employer: UNEMPLOYED  Tobacco Use   Smoking status: Former    Packs/day: 1.00    Years: 25.00    Total pack years: 25.00    Types: Cigarettes    Quit date: 02/11/1996    Years since quitting: 26.0   Smokeless tobacco: Never   Tobacco comments:    Married, lives with spouse. 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   Please see review of systems for further details on the patient's review from today.   Objective:   Physical Exam:  LMP 05/11/2012  Physical Exam Neurological:     Mental Status: She is alert and oriented to person, place, and time.     Cranial Nerves: No dysarthria.  Psychiatric:        Attention and Perception: Attention and perception normal.        Mood and Affect: Mood is anxious and depressed.        Speech: Speech normal.        Behavior: Behavior is cooperative.        Thought Content: Thought content normal. Thought content is not paranoid or delusional. Thought content does not include homicidal or suicidal ideation. Thought content does not include suicidal plan.        Cognition and Memory: Cognition and memory normal.     Comments:  Insight and judgment fair and less impulsive Chronic anxiety and avoidant Much less depressed     Lab Review:     Component Value Date/Time   NA 141 04/25/2021 1436   NA 145 (H) 04/05/2020 1508   NA 142 01/21/2017 1013   NA 143 01/16/2016 1005   K 3.9 04/25/2021 1436   K 3.2 (L) 01/21/2017 1013   K 3.3 (L) 01/16/2016 1005   CL 105 04/25/2021 1436   CL 106 01/21/2017 1013   CO2 25 04/25/2021 1436   CO2 29 01/21/2017 1013   CO2 20 (L) 01/16/2016 1005   GLUCOSE 104 (H) 04/25/2021 1436   GLUCOSE 95 01/21/2017 1013   BUN 12 04/25/2021 1436   BUN 14 04/05/2020 1508   BUN 9 01/21/2017 1013   BUN 12.6 01/16/2016 1005   CREATININE 0.93 04/25/2021 1436   CREATININE 0.94 12/20/2019 1449   CREATININE 0.9 01/21/2017 1013   CREATININE 0.9 01/16/2016 1005   CALCIUM 10.1 04/25/2021 1436   CALCIUM 9.2 01/21/2017 1013   CALCIUM 9.5 01/16/2016 1005   PROT 7.2 04/25/2021 1436   PROT 7.2 04/05/2020 1508   PROT 6.9 01/21/2017 1013   PROT 7.1 01/16/2016 1005   ALBUMIN 4.4 04/25/2021 1436   ALBUMIN 4.6 04/05/2020 1508   ALBUMIN 3.6 01/16/2016 1005   AST 18 04/25/2021 1436   AST 13 (L) 12/20/2019 1449   AST 16 01/16/2016 1005   ALT 19 04/25/2021 1436   ALT 15 12/20/2019 1449   ALT 22 01/21/2017 1013   ALT 24 01/16/2016 1005   ALKPHOS 87 04/25/2021 1436   ALKPHOS 62 01/21/2017 1013   ALKPHOS 81 01/16/2016 1005   BILITOT 0.3 04/25/2021 1436   BILITOT <0.2 04/05/2020 1508   BILITOT 0.3 12/20/2019 1449   BILITOT 0.59 01/16/2016 1005   GFRNONAA 76 04/05/2020 1508   GFRNONAA >60 12/20/2019 1449   GFRAA 87 04/05/2020 1508   GFRAA >60 12/03/2018 1346       Component Value Date/Time   WBC 7.2 04/25/2021 1436   RBC 4.70 04/25/2021 1436   HGB 12.4 04/25/2021 1436   HGB 13.7 04/05/2020 1508   HGB 12.8 01/21/2017 1013   HCT 38.3 04/25/2021 1436   HCT 41.4 04/05/2020 1508   HCT 38.2 01/21/2017 1013   PLT 316.0 04/25/2021 1436   PLT 289 04/05/2020 1508   MCV 81.4 04/25/2021 1436    MCV 85 04/05/2020 1508   MCV 90 01/21/2017 1013   MCH 28.0 04/05/2020 1508   MCH 28.9 12/20/2019 1449   MCHC 32.5 04/25/2021 1436   RDW 16.3 (H) 04/25/2021 1436   RDW 15.0 04/05/2020 1508   RDW 14.5 01/21/2017 1013   LYMPHSABS 2.3 04/25/2021 1436  LYMPHSABS 2.5 04/05/2020 1508   LYMPHSABS 3.6 (H) 01/21/2017 1013   MONOABS 0.3 04/25/2021 1436   EOSABS 0.2 04/25/2021 1436   EOSABS 0.1 04/05/2020 1508   EOSABS 0.5 01/21/2017 1013   BASOSABS 0.1 04/25/2021 1436   BASOSABS 0.1 04/05/2020 1508   BASOSABS 0.0 01/21/2017 1013    No results found for: "POCLITH", "LITHIUM"   Lab Results  Component Value Date   PHENYTOIN <0.5 ug/mL (L) 11/05/2006   VALPROATE 73.7 11/25/2016     .res Assessment: Plan:    Aviva was seen today for follow-up, depression, anxiety and fatigue.  Diagnoses and all orders for this visit:  Major depressive disorder, recurrent episode, moderate ([place]) -     Dextromethorphan-buPROPion ER (AUVELITY) 45-105 MG TBCR; Take 1 tablet by mouth 2 (two) times daily. -     modafinil (PROVIGIL) 200 MG tablet; Take 1.5 tablets (300 mg total) by mouth in the morning. -     OLANZapine (ZYPREXA) 10 MG tablet; Take 1 tablet (10 mg total) by mouth at bedtime.  Generalized anxiety disorder  Panic disorder with agoraphobia -     sertraline (ZOLOFT) 50 MG tablet; Take 1.5 tablets (75 mg total) by mouth daily.  Claustrophobia  Hoarding behavior  Benign essential tremor  Uncontrolled restless legs syndrome  Tremor -     propranolol (INDERAL) 20 MG tablet; Take 1 tablet (20 mg total) by mouth 2 (two) times daily.  Other orders -     gabapentin (NEURONTIN) 600 MG tablet; Take 2 tablets (1,200 mg total) by mouth every evening.   Poor sleep hygiene.  Improved with modafinil and motivation is better with it but wants to increase it.  TRD.  Lifelong history of chronic anhedonic depression and anxiety with very poor functioning continues.  Prognosis is guarded bc multiple  med failures and her resistance to change and chronicity of sx and low motivation for change.  She was getting some benefit from the prior medications a little bit more active and interested and motivated with the addition of modafinil without side effects.  She is highly dysfunctional and much worse off the sertraline for months..  She is not currently manic.  She is tolerating the medications.  She is had multiple medication failures as noted above.  She is fearful of trying new medications because of a history of suicidal thoughts on Vraylar.   Failed all FDA approved bipolar depression meds except Symbyax.  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  Her symptoms are not gone and she is still severely impaired in function especially socially but she is much happier and much less anxious.  She wonders about increasing the olanzapine if she could get further improvement.  However she has had restless legs syndrome as a side effect which is currently partially managed.  Best response ever with current med regimen. Told a joke for the first time ever in session on this med regimen.  continue sertraline 75 for depression and anxiety    olanzapine to 10 mg HS bc unclear if higher dose helped more than lower. RLS is better with MG  Less depressed with Auvelity for TRD BID markedly  continue gabapentin for ok to continue RLS 1200 mg PM and disc SE.    Caffeine can worsen RLS.  Disc timing in relation to olanzapine. Caution about excessive dizziness from gabapentin and fall risk.  Tolerated ok right now.  Discussed  potential metabolic side effects associated with atypical antipsychotics, as well as potential risk for movement side effects. Advised pt to contact office if movement side effects occur.  For tremor is considering increasing the propranolol continue 20 BID She'll check with  cardiologist on this.  Push fluids bc so much Time in bed and past history of dehydration. Disc risk propranolol but it helps tremor.  Increase activity.  Work on sleep schedule and try to regulate it for overall mental health benefits.  Keep working on improving activity.  Every little bit of progress can be additive over time.  Discussed the Cognitive behavioral approaches.  Doesn't drive much.  She is not motivated to do these things right now. Exposure behavioral therapy discussed.    Modafinil would be likely safer than traditional stimulants which could cause psychotic sx.  Discussed side effects.  She has had none.   Continue other meds. Dont' increase further, Benefit 400 mg modafinil.  Okay to continue 300 mg daily  This appt was 30 mins.  FU 8 weeks  Lynder Parents, MD, DFAPA   Please see After Visit Summary for patient specific instructions.  Future Appointments  Date Time Provider [place]  03/20/2022  3:00 PM Bauert, Nicolasa Ducking, LCSW [facility] None  04/15/2022  3:00 PM Bauert, Nicolasa Ducking, LCSW [facility] None  04/23/2022  3:00 PM Cottle, Billey Co., MD [facility] None  04/29/2022  1:00 PM Ann Held, DO [facility]  05/16/2022  2:00 PM Bauert, Nicolasa Ducking, LCSW [facility] None  06/05/2022  3:00 PM Laurence Ferrari, [place], MD [facility] None  01/22/2023  2:20 PM Moye, [place], MD [facility] None    No orders of the defined types were placed in this encounter.     -------------------------------

## 2022-02-20 ENCOUNTER — Ambulatory Visit (INDEPENDENT_AMBULATORY_CARE_PROVIDER_SITE_OTHER): Payer: 59 | Admitting: Psychology

## 2022-02-20 ENCOUNTER — Other Ambulatory Visit: Payer: Self-pay

## 2022-02-20 ENCOUNTER — Other Ambulatory Visit (HOSPITAL_COMMUNITY): Payer: Self-pay

## 2022-02-20 DIAGNOSIS — F331 Major depressive disorder, recurrent, moderate: Secondary | ICD-10-CM | POA: Diagnosis not present

## 2022-02-20 NOTE — Progress Notes (Signed)
[facility] Counselor/Therapist Progress Note  Patient ID: Beverly Cole, MRN: [id number],    Date: 02/20/2022  Time Spent: 4:00pm - 4:50pm   50 minutes   Treatment Type: Individual Therapy  Reported Symptoms: sadness  Mental Status Exam: Appearance:  Casual     Behavior: Appropriate  Motor: Normal  Speech/Language:  Normal Rate  Affect: Appropriate  Mood: normal  Thought process: normal  Thought content:   WNL  Sensory/Perceptual disturbances:   WNL  Orientation: oriented to person, place, time/date, and situation  Attention: Good  Concentration: Good  Memory: WNL  Fund of knowledge:  Good  Insight:   Good  Judgment:  Good  Impulse Control: Good   Risk Assessment: Danger to Self:  No Self-injurious Behavior: No Danger to Others: No Duty to Warn:no Physical Aggression / Violence:No  Access to Firearms a concern: No  Gang Involvement:No   Subjective: Pt present for face-to-face individual therapy via video Webex.  Pt consents to telehealth video session due to COVID 19 pandemic. Location of pt: home Location of therapist: home office.   Pt talked about having to put her cat Brutus down.  Helped pt process her feelings and grief.   Pt talked about making plans to go to [place] this April.   Pt is looking forward to it.   Pt continues to have improved mood since the changes in her medication.   Pt talked about having a lunch with friends tomorrow but she is dreading it.   Helped pt process her feelings and worked on managing anticipatory anxiety.   Provided supportive therapy.    Interventions: Cognitive Behavioral Therapy and Insight-Oriented  Diagnosis: F33.1  Plan of Care: Recommend ongoing therapy.   Pt participated in setting treatment goals.  Plan to meet monthly.    Treatment Plan Client Abilities/Strengths  Pt is bright, engaging, and motivated for therapy.   Client Treatment Preferences  Individual therapy.  Client Statement of Needs   Improve coping skills.  Symptoms  Depressed or irritable mood. Diminished interest in or enjoyment of activities. Lack of energy. Poor concentration and indecisiveness. Social withdrawal.  Problems Addressed  Unipolar Depression Goals 1. Alleviate depressive symptoms and return to previous level of effective functioning. 2. Appropriately grieve the loss in order to normalize mood and to return to previously adaptive level of functioning. Objective Learn and implement behavioral strategies to overcome depression. Target Date: 2022-10-30 Frequency: Biweekly  Progress: 50 Modality: individual  Related Interventions Engage the client in "behavioral activation," increasing his/her activity level and contact with sources of reward, while identifying processes that inhibit activation.  Use behavioral techniques such as instruction, rehearsal, role-playing, role reversal, as needed, to facilitate activity in the client's daily life; reinforce success. Assist the client in developing skills that increase the likelihood of deriving pleasure from behavioral activation (e.g., assertiveness skills, developing an exercise plan, less internal/more external focus, increased social involvement); reinforce success. Objective Identify important people in life, past and present, and describe the quality, good and poor, of those relationships. Target Date: 2022-10-30 Frequency: Biweekly  Progress: 50 Modality: individual  Related Interventions Conduct Interpersonal Therapy beginning with the assessment of the client's "interpersonal inventory" of important past and present relationships; develop a case formulation linking depression to grief, interpersonal role disputes, role transitions, and/or interpersonal deficits). Objective Learn and implement problem-solving and decision-making skills. Target Date: 2022-10-30 Frequency: Biweekly  Progress: 50 Modality: individual  Related Interventions Conduct  Problem-Solving Therapy using techniques such as psychoeducation, modeling, and role-playing to  teach client problem-solving skills (i.e., defining a problem specifically, generating possible solutions, evaluating the pros and cons of each solution, selecting and implementing a plan of action, evaluating the efficacy of the plan, accepting or revising the plan); role-play application of the problem-solving skill to a real life issue. Encourage in the client the development of a positive problem orientation in which problems and solving them are viewed as a natural part of life and not something to be feared, despaired, or avoided. 3. Develop healthy interpersonal relationships that lead to the alleviation and help prevent the relapse of depression. 4. Develop healthy thinking patterns and beliefs about self, others, and the world that lead to the alleviation and help prevent the relapse of depression. 5. Recognize, accept, and cope with feelings of depression. Diagnosis F33.1  Conditions For Discharge Achievement of treatment goals and objectives   Clint Bolder, LCSW

## 2022-02-21 ENCOUNTER — Other Ambulatory Visit: Payer: Self-pay

## 2022-02-21 ENCOUNTER — Other Ambulatory Visit (HOSPITAL_COMMUNITY): Payer: Self-pay

## 2022-02-24 ENCOUNTER — Other Ambulatory Visit (HOSPITAL_COMMUNITY): Payer: Self-pay

## 2022-02-24 ENCOUNTER — Encounter: Payer: Self-pay | Admitting: Dermatology

## 2022-02-26 ENCOUNTER — Other Ambulatory Visit (HOSPITAL_COMMUNITY): Payer: Self-pay

## 2022-02-27 ENCOUNTER — Telehealth: Payer: Self-pay | Admitting: Psychiatry

## 2022-02-27 ENCOUNTER — Other Ambulatory Visit (HOSPITAL_COMMUNITY): Payer: Self-pay

## 2022-02-27 NOTE — Telephone Encounter (Signed)
LVM

## 2022-02-27 NOTE — Telephone Encounter (Signed)
Pt called almost out of samples waiting on PA AUVALITY. Contact when samples are ready for pick up.[phone]

## 2022-03-04 ENCOUNTER — Other Ambulatory Visit (HOSPITAL_COMMUNITY): Payer: Self-pay

## 2022-03-05 ENCOUNTER — Telehealth: Payer: Self-pay | Admitting: Psychiatry

## 2022-03-05 NOTE — Telephone Encounter (Signed)
Pt LVM been taking samples Auvaity. Need PA? RTC [phone]

## 2022-03-05 NOTE — Telephone Encounter (Signed)
Lvm TO RTC

## 2022-03-06 ENCOUNTER — Other Ambulatory Visit (HOSPITAL_COMMUNITY): Payer: Self-pay

## 2022-03-06 ENCOUNTER — Other Ambulatory Visit: Payer: Self-pay | Admitting: Family Medicine

## 2022-03-06 DIAGNOSIS — I10 Essential (primary) hypertension: Secondary | ICD-10-CM

## 2022-03-06 NOTE — Telephone Encounter (Signed)
Yes it's scanned in the media section

## 2022-03-06 NOTE — Telephone Encounter (Signed)
Pt lvm 1/24 @ 9:24p.  She said she had approval last year for the [place] but has changed insurance to [employer].  She is being told that we are suppose to request the approval.  Pls call her back to let her know if she needs to do something.  Next appt 3/13

## 2022-03-06 NOTE — Telephone Encounter (Signed)
Do we have her new insurance information? I see lasts years PA

## 2022-03-06 NOTE — Telephone Encounter (Signed)
Please review, needs PA

## 2022-03-07 ENCOUNTER — Other Ambulatory Visit (HOSPITAL_COMMUNITY): Payer: Self-pay

## 2022-03-07 ENCOUNTER — Telehealth: Payer: Self-pay | Admitting: Psychiatry

## 2022-03-07 ENCOUNTER — Other Ambulatory Visit: Payer: Self-pay

## 2022-03-07 MED ORDER — DILTIAZEM HCL ER COATED BEADS 180 MG PO CP24
180.0000 mg | ORAL_CAPSULE | Freq: Every day | ORAL | 0 refills | Status: DC
  Filled 2022-03-07: qty 90, 90d supply, fill #0

## 2022-03-07 NOTE — Telephone Encounter (Signed)
Please see message about PA. There is an error message in [facility] that it can't be sent.

## 2022-03-07 NOTE — Telephone Encounter (Signed)
Pt called at 3:38 asking for status of PA.  I read her the message.  Pls call her to let her know the process and expected date of completion.  Next appt 3/13

## 2022-03-08 ENCOUNTER — Other Ambulatory Visit (HOSPITAL_COMMUNITY): Payer: Self-pay

## 2022-03-10 ENCOUNTER — Other Ambulatory Visit (HOSPITAL_COMMUNITY): Payer: Self-pay

## 2022-03-10 NOTE — Telephone Encounter (Signed)
An approval for an antidepressant of only 2 months is not logical.  It can take 2 months to see the full benefit.  Perhaps they are looking for documentation that the patient actually improved on the medicine.

## 2022-03-10 NOTE — Telephone Encounter (Signed)
Pt LVM to contact [place] support '@[phone] for [facility] Rx

## 2022-03-10 NOTE — Telephone Encounter (Signed)
Prior Approval submitted through covered my meds with [employer], approval received for Auvelity 45-105 mg #180 effective 03/10/2022-05/08/2022. Unfortunately they only approved for 2 months.

## 2022-03-10 NOTE — Telephone Encounter (Signed)
LVM with info per DPR.  

## 2022-03-10 NOTE — Telephone Encounter (Signed)
The previously mentioned error message In [facility] is from November.

## 2022-03-11 ENCOUNTER — Other Ambulatory Visit (HOSPITAL_COMMUNITY): Payer: Self-pay

## 2022-03-12 ENCOUNTER — Other Ambulatory Visit (HOSPITAL_COMMUNITY): Payer: Self-pay

## 2022-03-20 ENCOUNTER — Ambulatory Visit (INDEPENDENT_AMBULATORY_CARE_PROVIDER_SITE_OTHER): Payer: 59 | Admitting: Psychology

## 2022-03-20 DIAGNOSIS — F331 Major depressive disorder, recurrent, moderate: Secondary | ICD-10-CM

## 2022-03-20 NOTE — Progress Notes (Signed)
[facility] Counselor/Therapist Progress Note  Patient ID: Beverly Cole, MRN: [id number],    Date: 03/20/2022  Time Spent: 3:00pm - 3:50pm   50 minutes   Treatment Type: Individual Therapy  Reported Symptoms: sadness  Mental Status Exam: Appearance:  Casual     Behavior: Appropriate  Motor: Normal  Speech/Language:  Normal Rate  Affect: Appropriate  Mood: normal  Thought process: normal  Thought content:   WNL  Sensory/Perceptual disturbances:   WNL  Orientation: oriented to person, place, time/date, and situation  Attention: Good  Concentration: Good  Memory: WNL  Fund of knowledge:  Good  Insight:   Good  Judgment:  Good  Impulse Control: Good   Risk Assessment: Danger to Self:  No Self-injurious Behavior: No Danger to Others: No Duty to Warn:no Physical Aggression / Violence:No  Access to Firearms a concern: No  Gang Involvement:No   Subjective: Pt present for face-to-face individual therapy via video Webex.  Pt consents to telehealth video session due to COVID 19 pandemic. Location of pt: home Location of therapist: home office.   Pt talked about feeling so much better on the new medication she is on.  Pt states she has more energy and her head is clearer.  She is not feeling depressed.   Pt is going out with [name] twice a week to walk around and get out of the house.  Pt is trying to build up stamina in preparation for their [place] trip in April.   Pt talked about a friend she is going to visit next week.  Pt's friend's son died last year at only 63 years of age.   Provided supportive therapy.    Interventions: Cognitive Behavioral Therapy and Insight-Oriented  Diagnosis: F33.1  Plan of Care: Recommend ongoing therapy.   Pt participated in setting treatment goals.  Plan to meet monthly.    Treatment Plan Client Abilities/Strengths  Pt is bright, engaging, and motivated for therapy.   Client Treatment Preferences  Individual therapy.  Client  Statement of Needs  Improve coping skills.  Symptoms  Depressed or irritable mood. Diminished interest in or enjoyment of activities. Lack of energy. Poor concentration and indecisiveness. Social withdrawal.  Problems Addressed  Unipolar Depression Goals 1. Alleviate depressive symptoms and return to previous level of effective functioning. 2. Appropriately grieve the loss in order to normalize mood and to return to previously adaptive level of functioning. Objective Learn and implement behavioral strategies to overcome depression. Target Date: 2022-10-30 Frequency: Biweekly  Progress: 50 Modality: individual  Related Interventions Engage the client in "behavioral activation," increasing his/her activity level and contact with sources of reward, while identifying processes that inhibit activation.  Use behavioral techniques such as instruction, rehearsal, role-playing, role reversal, as needed, to facilitate activity in the client's daily life; reinforce success. Assist the client in developing skills that increase the likelihood of deriving pleasure from behavioral activation (e.g., assertiveness skills, developing an exercise plan, less internal/more external focus, increased social involvement); reinforce success. Objective Identify important people in life, past and present, and describe the quality, good and poor, of those relationships. Target Date: 2022-10-30 Frequency: Biweekly  Progress: 50 Modality: individual  Related Interventions Conduct Interpersonal Therapy beginning with the assessment of the client's "interpersonal inventory" of important past and present relationships; develop a case formulation linking depression to grief, interpersonal role disputes, role transitions, and/or interpersonal deficits). Objective Learn and implement problem-solving and decision-making skills. Target Date: 2022-10-30 Frequency: Biweekly  Progress: 50 Modality: individual  Related  Interventions Conduct Problem-Solving Therapy using techniques such as psychoeducation, modeling, and role-playing to teach client problem-solving skills (i.e., defining a problem specifically, generating possible solutions, evaluating the pros and cons of each solution, selecting and implementing a plan of action, evaluating the efficacy of the plan, accepting or revising the plan); role-play application of the problem-solving skill to a real life issue. Encourage in the client the development of a positive problem orientation in which problems and solving them are viewed as a natural part of life and not something to be feared, despaired, or avoided. 3. Develop healthy interpersonal relationships that lead to the alleviation and help prevent the relapse of depression. 4. Develop healthy thinking patterns and beliefs about self, others, and the world that lead to the alleviation and help prevent the relapse of depression. 5. Recognize, accept, and cope with feelings of depression. Diagnosis F33.1  Conditions For Discharge Achievement of treatment goals and objectives   Clint Bolder, LCSW

## 2022-04-10 ENCOUNTER — Other Ambulatory Visit (HOSPITAL_COMMUNITY): Payer: Self-pay

## 2022-04-10 ENCOUNTER — Other Ambulatory Visit: Payer: Self-pay

## 2022-04-10 ENCOUNTER — Other Ambulatory Visit: Payer: Self-pay | Admitting: Family Medicine

## 2022-04-10 DIAGNOSIS — K219 Gastro-esophageal reflux disease without esophagitis: Secondary | ICD-10-CM

## 2022-04-10 DIAGNOSIS — N952 Postmenopausal atrophic vaginitis: Secondary | ICD-10-CM

## 2022-04-10 MED ORDER — DEXLANSOPRAZOLE 60 MG PO CPDR
1.0000 | DELAYED_RELEASE_CAPSULE | Freq: Every day | ORAL | 2 refills | Status: DC
  Filled 2022-04-10: qty 30, 30d supply, fill #0
  Filled 2022-05-11: qty 30, 30d supply, fill #1
  Filled 2022-06-09: qty 30, 30d supply, fill #2

## 2022-04-10 MED ORDER — TRIMETHOPRIM 100 MG PO TABS
ORAL_TABLET | ORAL | 2 refills | Status: DC
  Filled 2022-04-10: qty 30, 30d supply, fill #0
  Filled 2022-08-25: qty 30, 30d supply, fill #1
  Filled 2022-11-20: qty 30, 30d supply, fill #2

## 2022-04-15 ENCOUNTER — Ambulatory Visit (INDEPENDENT_AMBULATORY_CARE_PROVIDER_SITE_OTHER): Payer: 59 | Admitting: Psychology

## 2022-04-15 DIAGNOSIS — F331 Major depressive disorder, recurrent, moderate: Secondary | ICD-10-CM | POA: Diagnosis not present

## 2022-04-15 NOTE — Progress Notes (Signed)
[facility] Counselor/Therapist Progress Note  Patient ID: Beverly Cole, MRN: [id number],    Date: 04/15/2022  Time Spent: 3:00pm - 3:50pm   50 minutes   Treatment Type: Individual Therapy  Reported Symptoms: sadness  Mental Status Exam: Appearance:  Casual     Behavior: Appropriate  Motor: Normal  Speech/Language:  Normal Rate  Affect: Appropriate  Mood: normal  Thought process: normal  Thought content:   WNL  Sensory/Perceptual disturbances:   WNL  Orientation: oriented to person, place, time/date, and situation  Attention: Good  Concentration: Good  Memory: WNL  Fund of knowledge:  Good  Insight:   Good  Judgment:  Good  Impulse Control: Good   Risk Assessment: Danger to Self:  No Self-injurious Behavior: No Danger to Others: No Duty to Warn:no Physical Aggression / Violence:No  Access to Firearms a concern: No  Gang Involvement:No   Subjective: Pt present for face-to-face individual therapy via video Webex.  Pt consents to telehealth video session due to COVID 19 pandemic. Location of pt: home Location of therapist: home office.   Pt talked about feeling sad bc she had to put down one of her cats.  Helped pt process her feelings and grief. Pt talked about her medications.  She feels like she finally is on a medication regimen that really helps her.   Her mood and sleep have improved.  Pt's husband Beverly Cole has noticed pt's improvement which has felt good to her.  Pt talked about planning to go to [place] the middle of April.   Worked on self care strategies.  Provided supportive therapy.    Interventions: Cognitive Behavioral Therapy and Insight-Oriented  Diagnosis: F33.1  Plan of Care: Recommend ongoing therapy.   Pt participated in setting treatment goals.  Plan to meet monthly.    Treatment Plan Client Abilities/Strengths  Pt is bright, engaging, and motivated for therapy.   Client Treatment Preferences  Individual therapy.  Client Statement  of Needs  Improve coping skills.  Symptoms  Depressed or irritable mood. Diminished interest in or enjoyment of activities. Lack of energy. Poor concentration and indecisiveness. Social withdrawal.  Problems Addressed  Unipolar Depression Goals 1. Alleviate depressive symptoms and return to previous level of effective functioning. 2. Appropriately grieve the loss in order to normalize mood and to return to previously adaptive level of functioning. Objective Learn and implement behavioral strategies to overcome depression. Target Date: 2022-10-30 Frequency: Biweekly  Progress: 50 Modality: individual  Related Interventions Engage the client in "behavioral activation," increasing his/her activity level and contact with sources of reward, while identifying processes that inhibit activation.  Use behavioral techniques such as instruction, rehearsal, role-playing, role reversal, as needed, to facilitate activity in the client's daily life; reinforce success. Assist the client in developing skills that increase the likelihood of deriving pleasure from behavioral activation (e.g., assertiveness skills, developing an exercise plan, less internal/more external focus, increased social involvement); reinforce success. Objective Identify important people in life, past and present, and describe the quality, good and poor, of those relationships. Target Date: 2022-10-30 Frequency: Biweekly  Progress: 50 Modality: individual  Related Interventions Conduct Interpersonal Therapy beginning with the assessment of the client's "interpersonal inventory" of important past and present relationships; develop a case formulation linking depression to grief, interpersonal role disputes, role transitions, and/or interpersonal deficits). Objective Learn and implement problem-solving and decision-making skills. Target Date: 2022-10-30 Frequency: Biweekly  Progress: 50 Modality: individual  Related  Interventions Conduct Problem-Solving Therapy using techniques such as psychoeducation, modeling, and  role-playing to teach client problem-solving skills (i.e., defining a problem specifically, generating possible solutions, evaluating the pros and cons of each solution, selecting and implementing a plan of action, evaluating the efficacy of the plan, accepting or revising the plan); role-play application of the problem-solving skill to a real life issue. Encourage in the client the development of a positive problem orientation in which problems and solving them are viewed as a natural part of life and not something to be feared, despaired, or avoided. 3. Develop healthy interpersonal relationships that lead to the alleviation and help prevent the relapse of depression. 4. Develop healthy thinking patterns and beliefs about self, others, and the world that lead to the alleviation and help prevent the relapse of depression. 5. Recognize, accept, and cope with feelings of depression. Diagnosis F33.1  Conditions For Discharge Achievement of treatment goals and objectives   Clint Bolder, LCSW

## 2022-04-23 ENCOUNTER — Other Ambulatory Visit (HOSPITAL_COMMUNITY): Payer: Self-pay

## 2022-04-23 ENCOUNTER — Encounter: Payer: Self-pay | Admitting: Psychiatry

## 2022-04-23 ENCOUNTER — Telehealth: Payer: 59 | Admitting: Psychiatry

## 2022-04-23 DIAGNOSIS — F423 Hoarding disorder: Secondary | ICD-10-CM

## 2022-04-23 DIAGNOSIS — G2581 Restless legs syndrome: Secondary | ICD-10-CM

## 2022-04-23 DIAGNOSIS — F331 Major depressive disorder, recurrent, moderate: Secondary | ICD-10-CM | POA: Diagnosis not present

## 2022-04-23 DIAGNOSIS — G250 Essential tremor: Secondary | ICD-10-CM

## 2022-04-23 DIAGNOSIS — R251 Tremor, unspecified: Secondary | ICD-10-CM

## 2022-04-23 DIAGNOSIS — F4001 Agoraphobia with panic disorder: Secondary | ICD-10-CM

## 2022-04-23 DIAGNOSIS — F40240 Claustrophobia: Secondary | ICD-10-CM

## 2022-04-23 DIAGNOSIS — F411 Generalized anxiety disorder: Secondary | ICD-10-CM | POA: Diagnosis not present

## 2022-04-23 DIAGNOSIS — F4000 Agoraphobia, unspecified: Secondary | ICD-10-CM

## 2022-04-23 MED ORDER — OLANZAPINE 5 MG PO TABS
5.0000 mg | ORAL_TABLET | Freq: Every day | ORAL | 0 refills | Status: DC
  Filled 2022-04-23: qty 30, 30d supply, fill #0

## 2022-04-23 MED ORDER — ALPRAZOLAM 0.5 MG PO TABS
0.5000 mg | ORAL_TABLET | Freq: Two times a day (BID) | ORAL | 0 refills | Status: DC | PRN
  Filled 2022-04-23: qty 30, 15d supply, fill #0

## 2022-04-23 NOTE — Progress Notes (Signed)
Beverly Cole [id number] [birth year] 62 y.o.  Video Visit via My Chart  I connected with pt by video using My Chart and verified that I am speaking with the correct person using two identifiers.   I discussed the limitations, risks, security and privacy concerns of performing an evaluation and management service by My Chart  and the availability of in person appointments. I also discussed with the patient that there may be a patient responsible charge related to this service. The patient expressed understanding and agreed to proceed.  I discussed the assessment and treatment plan with the patient. The patient was provided an opportunity to ask questions and all were answered. The patient agreed with the plan and demonstrated an understanding of the instructions.   The patient was advised to call back or seek an in-person evaluation if the symptoms worsen or if the condition fails to improve as anticipated.  I provided 30 minutes of video time during this encounter.  The patient was located at home and the provider was located office. Session 300-330 pm  Subjective:   Patient ID:  Beverly Cole is a 62 y.o. (DOB 10-07-1960) female.  Chief Complaint:  Chief Complaint  Patient presents with   Follow-up   Depression   Anxiety    Depression        Associated symptoms include decreased concentration and fatigue.  Associated symptoms include no suicidal ideas.  Past medical history includes anxiety.   Anxiety Symptoms include decreased concentration, dizziness, nervous/anxious behavior and shortness of breath. Patient reports no chest pain, confusion or suicidal ideas.     [name]  today for follow-up of chronic depression and anxiety.  Lately depression worse than anxiety.  Seen with husband today  When seen May 11, 2018.  For persistent anxiety we elected to retry buspirone and try increasing it to 30 mg twice daily if tolerated.   Couldn't tolerate it this time DT muscle  spasms.  Pharmacy called saying she could have serotonin syndrome.  When seen August 09, 2018 and she refused med changes despite chronic anxiety and depression. She remained on sertraline 50, Depakote ER 1000 mg, Wellbutrin SR 200 mg AM  Panic wearing cloth masks.  Using a shield.  Not in the office today bc H exposed to Covid.    seen December 09, 2018.  Because of chronic depression and dysfunction with fatigue and poor motivation we decided off label trial  trial of modafinil 200 mg 1/2 each am for 1 week then 200 mg each AM. Did see benefit from it.  More energy and working to stay out of bed more.    seen January 10, 2019.  The modafinil had been helpful.  No meds were changed.  seen April 07, 2019.  The following was noted: Still sees benefit from modafinil but still not caring for herself like she should.  Not wanting to go anywhere.  Not driven since early fall.  Hard to breathe with the mask and it creates anxiety.  Hard to be in enclosed spaces like cars and planes for extended periods.   Trying to set her alarm clock.  No hallucinations.  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  Plans to send more too.  Still follow through is not great.  Still depression and productivity is still poor but it is not as poor as it was before modafinil. Plan increase modafinil to 300  mg daily to see if function can be improved.  06/29/2019 appointment, the following is noted: Rare Xanax.  Did increase modafinil to 300 mg daily. Saw benefit with the increase with less time in bed but still markedly functionally impaired.   It seems like I get used to it and then doesn't maintain her get up and go.  Still having a hard time with self care like hygiene. Chronic low motivation and lack of interest is unchanged.   Gall bladder problems.  It's better at the moment.    Not more sad, just like I've always been.  Can't make herself leave the  house DT depression and anxiety.  She won't do chores.   Less excess sleeping.  He works from home 2 days weekly.  No periods of hyperactivity or manic sx since the spring. Attending therapy every 2 weeks.   Can be confused when first wakes up regardless of time of day. Had episode of hallucination in the middle of the night when awakened. Scarecrow frightened her. This is rare event.  No hallucinations during the day.  Melatonin makes it worse. Has had fearful thoughts of planes crashing or bad things happening to others and it might be her fault.  Fight with brother lately and not speaking with him now. Plan:  No med changes  10/11/19 appt with the following noted: Unsteady and tremors gotten worse.  Not gone to doctor. Propranolol not helping as much.   On Depakote ER '1000mg'$  HS which is a reduction. So tired all the time and H says she's not doing anything.  H says accomplishing littte and still lays in bed a lot.  H says it's 2-3 PM before she gets OOB.  Memory is poor.  Everything is such a chore and doesn't want to do things. H says she starts things she doesn't finish.  Lots of new projects.   Increase modafinil on her own to 400 mg daily in the AM.  Anxious.  Rare Xanax.  Was happy when got new cats in the summer.  Anxious and Depressed and describes anxiety as Moderate. Anxiety symptoms include: Excessive Worry,.  She thinks sertraline reduces her obsessiveness.  Past hx panic so avoidant. Never get to relax.  Pt reports sleeps excessively even with modafinil. Pt reports that appetite is good. Pt reports that energy is poor and loss of interest or pleasure in usual activities, poor motivation and withdrawn from usual activities. Just don't care about things and admits to a lot of general anxiety and everything takes a lot of energy out of her.  Compulsively tapes and watches TV shows, news and awards shows.  Can't delete it bc I'll miss something.  Watches TV in bed. Concentration is poor.  Suicidal thoughts:  denied by patient. But chronic death thoughts.  Poor productivity overall.  Chronic poor self care, showers only weekly.  Just don't care.  Chronic disability.  She thinks sertraline helped the anxiety some.  Likes that H is at home.  Helps her mood.  Doesn't care about going out. Feels faint when she tries to wear a mask.  Too anxious driving and is avoidant. Collects TV shows, she can't erase unless she watches entirely. Hoards some bc afraid she might need it some day.   Rare use of Xanax bc fears addiction. Plan: Attempted referral to neurology at [facility] neurology.  They refused to see patient stating they had nothing to offer her. It was suggested to patient that she try to get  in with Dr. Jannifer Franklin whom she had seen in the past.  01/10/2020 appointment with the following noted: Reduced modafinil to 300 mg bc didn't want to get hooked on anything about 2-3 weeks.  Did initially see benefit from the modafinil but seemed to get tolerant to it. Feels better not talking to brother bc differing views of politics.  Feels he's a negative person but never depressed. Overall thinks she's doing pretty well relatively.  However is chronically depressed and never happy.  Nothing changed with the meds. Depression worse than anxiety. H didn't think modafinil made much difference.  H CO her inactivity.. She felt it was helpful for energy initially. Had to cancel trips bc can't wear a mask for that long.  Makes her sad. Driven twice in a year or so DT anxiety. Wants prn Xanax. Plan: DC sertraline Start fluoxetine 20 mg daily with olanzapine 5 mg daily.  05/22/2020 appointment with following noted: Didn't remember to stop sertraline and took fluoxetine and olanzapine for 2 days and couldn't eat and stopped it. Then went to dermatologist December who told her couldn't take Zoloft with fungus med and stopped sertraline. Then became more depressed.  I have to go back on the Zoloft. Remained  on modafinil, Wellbutrin, Depakote.  Even less motivated to do anything like cook, clean.  Won't go to [employer] etc. Chronic depression with hopelessness to get better Plan: No med changes  07/31/2020 appointment with the following noted: Back on sertraline at 75 mg daily with Remained on modafinil, Wellbutrin, Depakote.  Aunt died and one of worst fears but not thinking about it. Family got Covid but she recovered.  She thought it might kill and didn't care. Tremors are getting worse and balance problems. Went to concert and that was a problem.  Enjoyed the concert. Also saw Kathrene Bongo.  Music always important to her.  1980 had planned to commit suicide listening to an album over and over but didn't attempt. B and M have familial tremor also. Still anxious and depressed and haven't driven in a year DT anxiety. Plan: Option of trial olanzapine alone without fluoxetine.  Rec retry.  She agrees.  10/23/2020 appointment with the following noted: "Olanzapine is really working."  Initial SE drunk at 5 mg HS so cut it in half.  Got used to it but I feel happy and more relaxed and easier time going to sleep.  If I could still chose to die I would but clearly happier but not more hopeful.  Laughing more.  No SI. Needs to care about things more.  Chronic lack of motivations. Still doesn't want to leave the house but did enjoy a concert in [place]. Chronic unsteadiness is worse.  Last disc with doctor in March who rec PT but she never did the PT.  Misses aunt who died.   She reduced the Depakote to 500 mg HS. When stopped sertraline was not doing well and restarted it bc stressed and unhappy.  Seems to be a key med for me. Pending card test soon.  Feels better than in the past. RLS is worse after starting olanzapine and even affecting arms but had it before.  Happens after lays down at night.  Takes MG which helps.   Xanax about once every 3 weeks. Plan: Successful trial olanzapine 2.5 mg with  sertraline 50 but worse RLS Trial gabapentin for RLS 100-300 mg PM and disc SE.  Higher if needed..  Once controlled consider increase olanzapine.  01/23/2021 appointment  with the following noted: Taking gabapentin 300 mg but needs to go higher for RLS.  SE dizzy and reduced concentration. Reads on internet.   Increased olanzapine to 5 mg HS a couple of mos ago and feels better with it.  Happier.  More relaxed.  Only think that's helped in a long time. Handwriting is so much better, less shakey. Has RLS and can be bad when she lays down. Got a new kitten and enjoys it.  Now has 4 cats. Afraid to try new meds after negative reaction to Vraylar.  Afraid of any med change.  Afraid of having SI and afraid she'd do it if had SI.   Saw pulmonologist over weakness and he says she's OK except deconditioned.  Can't tolerate wearing the masks. Plan: Successful trial olanzapine 5 mg with sertraline 50 for depression and anxiety but worse RLS Consider increasing the olanzapine later if RLS can be managed. increase gabapentin for RLS 400-600 mg PM and disc SE.  Higher if needed..  Once controlled consider increase olanzapine.  03/28/2021 appointment with the following noted: Uses Ivory soap behind knee with benefit.  Increased gabapentin to 600 mg PM.  Dizziness resolved which occurred when turned over in bed.  Not a problem. RLS still 5/7 days.  But had no vacation RLS bc was walking more.  Not active at home. Sleep is great 8-9 hours and best in a long time. Enjoyed expensive vacation to [facility].  Once in a lifetime thing.  Some things she didn't like for example the cost. Went to [employer] concert and others. Depression is better.  Olanzapine really helped.  Didn't expect it.   Hasn't felt this good in years and H notices.   Caffeine 2 cups hot tea and limited. Plan: Successful trial olanzapine 5 mg with sertraline 50 for depression and anxiety but worse RLS Consider increasing the olanzapine later if  RLS can be managed. increase gabapentin for ok to increase RLS 600-900 mg PM and disc SE.  Higher if needed..  Once controlled consider increase olanzapine.  05/27/2021 phone call wanting to increase olanzapine to see if it will further help depression.  She had increased it on her own to 10 mg for a couple of weeks.  She has to increase to 15 mg.  This was agreed  06/26/21 appt noted: On sertraline 50 and olanzapine 15 mg HS with 1200 mg PM gabapentin for RLS RLS kicks in about 4 hours after olanzapine.  Prefers to stay up late bc doesn't really want to go to sleep.  RLS 5 days per week. Sleep 8 hours. Caffeine 2 cups hot tea and limited. Benefit from increase olanzapine. Helped anxiety and able to go to sleep earlier than usual.   Before trip was happier than I've been in years.  Not real happy with trip and now nothing to look forward to.   SE some dizziness with gabapentin.  But it's better.   Mood still better than before olanzapine. Plan: benefit olanzapine 15 mg with sertraline 50 for depression and anxiety but worse RLS She wants to Consider increasing the olanzapine later if RLS can be managed. OK increase olanzapine to 20 mg PM If RLS is worse then will have to reduce.  08/26/2021 appointment with the following noted: Increased olanzapine 20 mg HS.  Not sure if it made a difference.  Terrible time with memorfy ongoing. Sleep good.  Still has RLS and can get pain in her arm including yesterday at 6-7 PM.  Asks about Ozempic for OCD. Depends on H [name]. Anxiety is better and that helps sleep.  Depression better than it was.  No longer thinks of death all the time like she use to.  Still not very active.  Need something to look forward to. Stays up late and likes doing so bc less depressed in evening.   Plan continue olanzapine 20 mg daily with sertraline 75 mg daily  11/26/2021 appointment noted:  seen with Sherren Kerns Memory is poor.  Forgets TV shows. Taking alprazolam  rarely. Overall thinks she is doing fine.  Still not driving much.  Don't like to leave the house but went outside on the deck some which is improvement. Sleeps 12 hours nightly but then up all day. Happier with olanazapine and thinks the higher doses did help the anxiety. H doesn't see any changes in the last year.  Covid messed her up and she has become housebound.  Not social.  She got used to being inside and no motivation to change it. H cleans and prepares meals.  Doesn't have motivation for chores. Plan: continue sertraline 50 for depression and anxiety  She wants to Consider increasing the olanzapine later if RLS can be managed. Reduce olanzapine to 10 mg HS bc unclear if higher dose helped more than lower. RLS is better with MG Modafinil 300 to 400 mg daily for alertness and off label for treatment resistant depression Trial Auvelity for TRD 1 in AM for 1 week, then 1 twice daily. DC Wellbutriin  12/25/2021 phone call requesting 90-day prescription of Auvelity stating that it was working well.  02/19/22 appt noted: Current psych med: rare Xanax, Auvelity BID, no fluoxetine, modafinil 300 mg AM , olanzapine 10 pm, propranolol 20 BID, sertraline 75 mg daily. Gabapentin 1200 mg daily I feel like doing things more and I see things more clearly.  Realizes now buying things to make myself feel happier and not really a buying problem per se.  Now not driven to buy things she shouldn't.   Feels embarrassed by what she did before with this.  Had bought a lot of expensive baskets and now no longer does that.  She is happier she's better and H notices it too.  Putting things away and better cleaning.  Tired a lot.  Better motivation..  had lunch with friends scheduled for Friday but is cancelling.  Not sure whay she is avoidant socially.  Anxious about it.  Almost fears a panic attack.  Not sure when had last panic but had them when she tried to go to work.  I can't.  Doesn't drive often.  Has a Lucianne Lei  but doesn't like driving it.  Can't drive at night.   Still sleeps 12 hours in 24.   Happy the way I am right now. Some trouble with restless legs. Plan: no med changes Less depressed with Auvelity for TRD BID markedly  04/23/22 appt noted: "Haven't felt this good in years." Consistent with Auvelity BID.   Current psych meds: modafinil 300 Am, Auvelity BID, sertraline 75, olanzapine 10 mg HS, propranolol 20 BID. Asks about going off olanzapine. 2 cats put to sleep and she did well with it despite in past had SI when doing it. Still sleeps 12 hour daily bc so tired and wants to try stopping it. No RLS on gabapentin.  To [place] next month.    Past psych med trials:  Wellbutrin XL 300, Sertraline 75, fluoxetine SE brief , Auvelity Vraylar SI, Latuda, Abilify ,  olanzapine 20 Symbyax side effects Seroquel which caused side effects of constipation,   lamotrigine,  refuses lithium because of altered taste.   pramipexole,  Modafinil 300-400  Hx primidone sed  She has a history of ataxia on 1500 mg of Depakote.   buspirone SE hallucinations Gabapentin for RLS  ECT 2016 without help,   Review of Systems:  Review of Systems  Constitutional:  Positive for fatigue.  Respiratory:  Positive for shortness of breath.   Cardiovascular:  Negative for chest pain.  Gastrointestinal:  Positive for abdominal pain. Negative for vomiting.  Musculoskeletal:  Positive for gait problem.  Neurological:  Positive for dizziness and tremors. Negative for weakness.       RLS  Psychiatric/Behavioral:  Positive for decreased concentration, dysphoric mood and sleep disturbance. Negative for agitation, behavioral problems, confusion, hallucinations, self-injury and suicidal ideas. The patient is nervous/anxious. The patient is not hyperactive.     Medications: I have reviewed the patient's current medications.  Current Outpatient Medications  Medication Sig Dispense Refill   aspirin EC 325 MG tablet Take  325 mg by mouth daily.     clobetasol ointment (TEMOVATE) 0.05 % Apply topically to nail, proximal nail fold daily on weekends only. Avoid applying to face, groin, and axilla. Use as directed. 30 g 3   dexlansoprazole (DEXILANT) 60 MG capsule Take 1 capsule (60 mg total) by mouth daily. 30 capsule 2   Dextromethorphan-buPROPion ER (AUVELITY) 45-105 MG TBCR Take 1 tablet by mouth 2 (two) times daily. 180 tablet 0   diltiazem (CARTIA XT) 180 MG 24 hr capsule Take 1 capsule (180 mg total) by mouth daily. 90 capsule 0   fexofenadine (ALLEGRA ALLERGY) 180 MG tablet Take 1 tablet (180 mg total) by mouth daily.     fluticasone (FLONASE) 50 MCG/ACT nasal spray Place 2 sprays into the nose daily as needed. For allergies     gabapentin (NEURONTIN) 600 MG tablet Take 2 tablets (1,200 mg total) by mouth every evening. 180 tablet 0   lidocaine (XYLOCAINE) 5 % ointment Apply a pea sized amount topically 20 minutes prior to intercourse, wipe off just prior to intercourse. 30 g 1   modafinil (PROVIGIL) 200 MG tablet Take 1.5 tablets (300 mg total) by mouth in the morning. 135 tablet 0   NONFORMULARY OR COMPOUNDED ITEM sm Azelaic acid/ metronidazole/ivermectin 15%/1%/1% cream apply to face bid 1 each 5   Probiotic Product (PROBIOTIC DAILY PO) Take 1 tablet by mouth daily.      propranolol (INDERAL) 20 MG tablet Take 1 tablet (20 mg total) by mouth 2 (two) times daily. 180 tablet 0   sertraline (ZOLOFT) 50 MG tablet Take 1.5 tablets (75 mg total) by mouth daily. 135 tablet 0   tretinoin (RETIN-A) 0.025 % cream Apply topically at bedtime. 45 g 4   trimethoprim (TRIMPEX) 100 MG tablet take as directed after intercourse 30 tablet 2   ALPRAZolam (XANAX) 0.5 MG tablet TAKE 1 TABLET (0.5 MG TOTAL) BY MOUTH 2 (TWO) TIMES DAILY AS NEEDED FOR ANXIETY. TAKE 1 TABLET 30 MINUTES PRIOR TO PROCEDURE 30 tablet 0   OLANZapine (ZYPREXA) 5 MG tablet Take 1 tablet (5 mg total) by mouth at bedtime. 30 tablet 0   Sod Picosulfate-Mag  Ox-Cit Acd (CLENPIQ) 10-3.5-12 MG-GM -GM/175ML SOLN Use as directed per office and not instructions on packaging. Do not refrigerate. 350 mL 0   No current facility-administered medications for this visit.    Medication Side Effects: None  Allergies:  Allergies  Allergen Reactions   Cariprazine Other (See Comments)    Patient becomes suicidal when taking this medication.  Patient becomes suicidal when taking this medication.    Metoprolol Other (See Comments)    Hallucinations hair falls out Hallucinations hair falls out   Nickel Rash   Iron Nausea And Vomiting    Past Medical History:  Diagnosis Date   Anxiety    Atrial fibrillation ([place])    a. Event monitor 2013 - PACs/bradycardia/SVT/short run of atrial fib by event monitor.    BIPOLAR DISORDER UNSPECIFIED    Bradycardia    Bursitis of hip 09/1999   Bilateral - Dr. Berenice Primas   DEPRESSION    Emphysema lung ([place]) 06/20/2016   pt states pulmonalongist stated started recently   GERD    Headache(784.0)    was with menstrual cycle. no longer a problem   HYPERLIPIDEMIA    Hypertension    Hypertension    IRRITABLE BOWEL SYNDROME, HX OF    Menopausal state 01/2012   A [address] = 88.5   Mitral valve prolapse 12/17/2000   a. dx 1980s, most recent echo did not demonstrate this.   MYALGIA    Normal coronary arteries    a. by cardiac CT 2013.   PAT (paroxysmal atrial tachycardia)    PE (pulmonary embolism) 02/09/2015   a. Bilateral PEs 01/2015 when d-dimer 0.69, CP lying on left side. Followed by heme-onc - + lupus anticoagulant, mildly depressed protein S. Dr. Marin Olp is not certain if her hypercoagulable studies are significant for a thrombophilic state.   Premature atrial contractions    Pulmonary embolus ([facility]) 02/10/2015   Shortness of breath    Pulmonary eval 05/2002   Thyroid nodule 2014   Tremors of nervous system     Family History  Problem Relation Age of Onset   Lung cancer Mother    Hypertension Mother     Hyperlipidemia Mother    Cancer Father        Esophageal cancer   Hyperlipidemia Father    Depression Father    Cancer Brother    Pulmonary embolism Brother    Colon cancer Paternal Uncle    Sarcoidosis Other    Testicular cancer Other     Social History   Socioeconomic History   Marital status: Married    Spouse name: Not on file   Number of children: 0   Years of education: Not on file   Highest education level: Not on file  Occupational History   Occupation: Nurse-Personal Care/HH    Employer: UNEMPLOYED  Tobacco Use   Smoking status: Former    Packs/day: 1.00    Years: 25.00    Total pack years: 25.00    Types: Cigarettes    Quit date: 02/11/1996    Years since quitting: 26.2   Smokeless tobacco: Never   Tobacco comments:    Married, lives with spouse. 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 Pt is a nurse with private care [employer]      No exercise   Pt is on nutrisystem right now   Social Determinants of Health  Financial Resource Strain: Not on file  Food Insecurity: Not on file  Transportation Needs: Not on file  Physical Activity: Not on file  Stress: Not on file  Social Connections: Not on file  Intimate Partner Violence: Not on file    Past Medical History, Surgical history, Social history, and Family history were reviewed and updated as appropriate.   Please see review of systems for further details on the patient's review from today.   Objective:   Physical Exam:  LMP 05/11/2012   Physical Exam Neurological:     Mental Status: She is alert and oriented to person, place, and time.     Cranial Nerves: No dysarthria.   Psychiatric:        Attention and Perception: Attention and perception normal.        Mood and Affect: Mood is anxious. Mood is not depressed.        Speech: Speech normal.        Behavior: Behavior is cooperative.        Thought Content: Thought content normal. Thought content is not paranoid or delusional. Thought content does not include homicidal or suicidal ideation. Thought content does not include suicidal plan.        Cognition and Memory: Cognition and memory normal.     Comments: Insight and judgment fair and less impulsive Chronic anxiety and avoidant Best mood in years.     Lab Review:     Component Value Date/Time   NA 141 04/25/2021 1436   NA 145 (H) 04/05/2020 1508   NA 142 01/21/2017 1013   NA 143 01/16/2016 1005   K 3.9 04/25/2021 1436   K 3.2 (L) 01/21/2017 1013   K 3.3 (L) 01/16/2016 1005   CL 105 04/25/2021 1436   CL 106 01/21/2017 1013   CO2 25 04/25/2021 1436   CO2 29 01/21/2017 1013   CO2 20 (L) 01/16/2016 1005   GLUCOSE 104 (H) 04/25/2021 1436   GLUCOSE 95 01/21/2017 1013   BUN 12 04/25/2021 1436   BUN 14 04/05/2020 1508   BUN 9 01/21/2017 1013   BUN 12.6 01/16/2016 1005   CREATININE 0.93 04/25/2021 1436   CREATININE 0.94 12/20/2019 1449   CREATININE 0.9 01/21/2017 1013   CREATININE 0.9 01/16/2016 1005   CALCIUM 10.1 04/25/2021 1436   CALCIUM 9.2 01/21/2017 1013   CALCIUM 9.5 01/16/2016 1005   PROT 7.2 04/25/2021 1436   PROT 7.2 04/05/2020 1508   PROT 6.9 01/21/2017 1013   PROT 7.1 01/16/2016 1005   ALBUMIN 4.4 04/25/2021 1436   ALBUMIN 4.6 04/05/2020 1508   ALBUMIN 3.6 01/16/2016 1005   AST 18 04/25/2021 1436   AST 13 (L) 12/20/2019 1449   AST 16 01/16/2016 1005   ALT 19 04/25/2021 1436   ALT 15 12/20/2019 1449   ALT 22 01/21/2017 1013   ALT 24 01/16/2016 1005   ALKPHOS 87 04/25/2021 1436   ALKPHOS 62 01/21/2017 1013   ALKPHOS 81 01/16/2016 1005   BILITOT 0.3 04/25/2021 1436   BILITOT <0.2 04/05/2020 1508   BILITOT 0.3 12/20/2019  1449   BILITOT 0.59 01/16/2016 1005   GFRNONAA 76 04/05/2020 1508   GFRNONAA >60 12/20/2019 1449   GFRAA 87 04/05/2020 1508   GFRAA >60 12/03/2018 1346       Component Value Date/Time   WBC 7.2 04/25/2021 1436   RBC 4.70 04/25/2021 1436   HGB 12.4 04/25/2021 1436   HGB 13.7 04/05/2020 1508   HGB 12.8 01/21/2017 1013  HCT 38.3 04/25/2021 1436   HCT 41.4 04/05/2020 1508   HCT 38.2 01/21/2017 1013   PLT 316.0 04/25/2021 1436   PLT 289 04/05/2020 1508   MCV 81.4 04/25/2021 1436   MCV 85 04/05/2020 1508   MCV 90 01/21/2017 1013   MCH 28.0 04/05/2020 1508   MCH 28.9 12/20/2019 1449   MCHC 32.5 04/25/2021 1436   RDW 16.3 (H) 04/25/2021 1436   RDW 15.0 04/05/2020 1508   RDW 14.5 01/21/2017 1013   LYMPHSABS 2.3 04/25/2021 1436   LYMPHSABS 2.5 04/05/2020 1508   LYMPHSABS 3.6 (H) 01/21/2017 1013   MONOABS 0.3 04/25/2021 1436   EOSABS 0.2 04/25/2021 1436   EOSABS 0.1 04/05/2020 1508   EOSABS 0.5 01/21/2017 1013   BASOSABS 0.1 04/25/2021 1436   BASOSABS 0.1 04/05/2020 1508   BASOSABS 0.0 01/21/2017 1013    No results found for: "POCLITH", "LITHIUM"   Lab Results  Component Value Date   PHENYTOIN <0.5 ug/mL (L) 11/05/2006   VALPROATE 73.7 11/25/2016     .res Assessment: Plan:    Maggy was seen today for follow-up, depression and anxiety.  Diagnoses and all orders for this visit:  Major depressive disorder, recurrent episode, moderate (HCC) -     OLANZapine (ZYPREXA) 5 MG tablet; Take 1 tablet (5 mg total) by mouth at bedtime.  Generalized anxiety disorder  Panic disorder with agoraphobia -     ALPRAZolam (XANAX) 0.5 MG tablet; TAKE 1 TABLET (0.5 MG TOTAL) BY MOUTH 2 (TWO) TIMES DAILY AS NEEDED FOR ANXIETY. TAKE 1 TABLET 30 MINUTES PRIOR TO PROCEDURE  Claustrophobia  Hoarding behavior  Benign essential tremor  Uncontrolled restless legs syndrome  Tremor  Agoraphobia -     ALPRAZolam (XANAX) 0.5 MG tablet; TAKE 1 TABLET (0.5 MG TOTAL) BY MOUTH 2 (TWO)  TIMES DAILY AS NEEDED FOR ANXIETY. TAKE 1 TABLET 30 MINUTES PRIOR TO PROCEDURE   Poor sleep hygiene.  Improved with modafinil and motivation is better with it but wants to increase it.  TRD.  Lifelong history of chronic anhedonic depression and anxiety with very poor functioning continues.  Prognosis is guarded bc multiple med failures and her resistance to change and chronicity of sx and low motivation for change.  She was getting some benefit from the prior medications a little bit more active and interested and motivated with the addition of modafinil without side effects.  She is highly dysfunctional and much worse off the sertraline for months..  She is not currently manic.  She is tolerating the medications.  She is had multiple medication failures as noted above.  She is fearful of trying new medications because of a history of suicidal thoughts on Vraylar.   Failed all FDA approved bipolar depression meds except Symbyax.  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 Best response ever with current med regimen. Told a joke for the first time ever in session on this med regimen.  continue sertraline 75 for depression and anxiety  Less depressed with Auvelity for TRD BID markedly, best in years. Ok trial reduction to olanzapine to 5  mg HS .  After 30 D if well can stop it. RLS is better with MG  continue gabapentin for ok to continue RLS 1200 mg PM and disc SE.   If RLS is better with less olanzapine then can reduce gabapentin. Caffeine can worsen RLS.  Disc timing in relation to olanzapine. Caution  about excessive dizziness from gabapentin and fall risk.  Tolerated ok right now.  Discussed potential metabolic side effects associated with atypical antipsychotics, as well as potential risk for movement side effects. Advised pt to contact office if movement side effects occur.   For tremor is considering increasing the propranolol continue 20 BID She'll check with cardiologist on this.  Push fluids bc so much Time in bed and past history of dehydration. Disc risk propranolol but it helps tremor.  Increase activity.  Work on sleep schedule and try to regulate it for overall mental health benefits.  Keep working on improving activity.  Every little bit of progress can be additive over time.  Discussed the Cognitive behavioral approaches.  Doesn't drive much.  She is not motivated to do these things right now. Exposure behavioral therapy discussed.    Modafinil would be likely safer than traditional stimulants which could cause psychotic sx.  Discussed side effects.  She has had none.   Continue other meds. Dont' increase further, Benefit 400 mg modafinil.  Okay to continue 300 mg daily  This appt was 30 mins.  FU 8 weeks  Lynder Parents, MD, DFAPA   Please see After Visit Summary for patient specific instructions.  Future Appointments  Date Time Provider [place]  04/29/2022  1:00 PM Ragine, [place], [place] [facility]  05/16/2022  2:00 PM Barrie Folk, LCSW [facility] None  06/05/2022  3:00 PM Laurence Ferrari, [place], MD [facility] None  01/22/2023  2:20 PM Moye, [place], MD [facility] None    No orders of the defined types were placed in this encounter.     -------------------------------

## 2022-04-29 ENCOUNTER — Ambulatory Visit (INDEPENDENT_AMBULATORY_CARE_PROVIDER_SITE_OTHER): Payer: 59 | Admitting: Family Medicine

## 2022-04-29 ENCOUNTER — Other Ambulatory Visit (HOSPITAL_COMMUNITY): Payer: Self-pay

## 2022-04-29 ENCOUNTER — Encounter: Payer: Self-pay | Admitting: Family Medicine

## 2022-04-29 ENCOUNTER — Other Ambulatory Visit (HOSPITAL_BASED_OUTPATIENT_CLINIC_OR_DEPARTMENT_OTHER): Payer: Self-pay

## 2022-04-29 VITALS — BP 110/70 | HR 75 | Temp 98.2°F | Resp 18 | Ht 69.0 in | Wt 177.8 lb

## 2022-04-29 DIAGNOSIS — L719 Rosacea, unspecified: Secondary | ICD-10-CM

## 2022-04-29 DIAGNOSIS — Z86711 Personal history of pulmonary embolism: Secondary | ICD-10-CM

## 2022-04-29 DIAGNOSIS — D6859 Other primary thrombophilia: Secondary | ICD-10-CM | POA: Diagnosis not present

## 2022-04-29 DIAGNOSIS — I10 Essential (primary) hypertension: Secondary | ICD-10-CM | POA: Diagnosis not present

## 2022-04-29 DIAGNOSIS — I2602 Saddle embolus of pulmonary artery with acute cor pulmonale: Secondary | ICD-10-CM | POA: Diagnosis not present

## 2022-04-29 DIAGNOSIS — K635 Polyp of colon: Secondary | ICD-10-CM | POA: Diagnosis not present

## 2022-04-29 DIAGNOSIS — Z0000 Encounter for general adult medical examination without abnormal findings: Secondary | ICD-10-CM

## 2022-04-29 DIAGNOSIS — M545 Low back pain, unspecified: Secondary | ICD-10-CM | POA: Insufficient documentation

## 2022-04-29 DIAGNOSIS — E039 Hypothyroidism, unspecified: Secondary | ICD-10-CM | POA: Diagnosis not present

## 2022-04-29 DIAGNOSIS — F3181 Bipolar II disorder: Secondary | ICD-10-CM

## 2022-04-29 DIAGNOSIS — E785 Hyperlipidemia, unspecified: Secondary | ICD-10-CM

## 2022-04-29 DIAGNOSIS — I2782 Chronic pulmonary embolism: Secondary | ICD-10-CM

## 2022-04-29 LAB — CBC WITH DIFFERENTIAL/PLATELET
Basophils Absolute: 0.1 10*3/uL (ref 0.0–0.1)
Basophils Relative: 0.7 % (ref 0.0–3.0)
Eosinophils Absolute: 0.2 10*3/uL (ref 0.0–0.7)
Eosinophils Relative: 2.4 % (ref 0.0–5.0)
HCT: 38.1 % (ref 36.0–46.0)
Hemoglobin: 12.3 g/dL (ref 12.0–15.0)
Lymphocytes Relative: 29.5 % (ref 12.0–46.0)
Lymphs Abs: 2.8 10*3/uL (ref 0.7–4.0)
MCHC: 32.2 g/dL (ref 30.0–36.0)
MCV: 77.7 fl — ABNORMAL LOW (ref 78.0–100.0)
Monocytes Absolute: 0.5 10*3/uL (ref 0.1–1.0)
Monocytes Relative: 5.7 % (ref 3.0–12.0)
Neutro Abs: 5.9 10*3/uL (ref 1.4–7.7)
Neutrophils Relative %: 61.7 % (ref 43.0–77.0)
Platelets: 380 10*3/uL (ref 150.0–400.0)
RBC: 4.9 Mil/uL (ref 3.87–5.11)
RDW: 17.8 % — ABNORMAL HIGH (ref 11.5–15.5)
WBC: 9.6 10*3/uL (ref 4.0–10.5)

## 2022-04-29 LAB — COMPREHENSIVE METABOLIC PANEL
ALT: 17 U/L (ref 0–35)
AST: 18 U/L (ref 0–37)
Albumin: 4.2 g/dL (ref 3.5–5.2)
Alkaline Phosphatase: 94 U/L (ref 39–117)
BUN: 14 mg/dL (ref 6–23)
CO2: 24 mEq/L (ref 19–32)
Calcium: 9.1 mg/dL (ref 8.4–10.5)
Chloride: 107 mEq/L (ref 96–112)
Creatinine, Ser: 0.84 mg/dL (ref 0.40–1.20)
GFR: 74.47 mL/min (ref >60.00)
Glucose, Bld: 116 mg/dL — ABNORMAL HIGH (ref 70–99)
Potassium: 4.2 mEq/L (ref 3.5–5.1)
Sodium: 142 mEq/L (ref 135–145)
Total Bilirubin: 0.2 mg/dL (ref 0.2–1.2)
Total Protein: 7.2 g/dL (ref 6.0–8.3)

## 2022-04-29 LAB — LIPID PANEL
Cholesterol: 282 mg/dL — ABNORMAL HIGH (ref 0–200)
HDL: 42.5 mg/dL (ref >39.00)
NonHDL: 239.26
Total CHOL/HDL Ratio: 7
Triglycerides: 312 mg/dL — ABNORMAL HIGH (ref 0.0–149.0)
VLDL: 62.4 mg/dL — ABNORMAL HIGH (ref 0.0–40.0)

## 2022-04-29 LAB — TSH: TSH: 5.42 u[IU]/mL (ref 0.35–5.50)

## 2022-04-29 LAB — LDL CHOLESTEROL, DIRECT: Direct LDL: 200 mg/dL

## 2022-04-29 MED ORDER — AREXVY 120 MCG/0.5ML IM SUSR
INTRAMUSCULAR | 0 refills | Status: DC
  Filled 2022-04-29: qty 0.5, 1d supply, fill #0

## 2022-04-29 MED ORDER — TRAMADOL HCL 50 MG PO TABS
50.0000 mg | ORAL_TABLET | Freq: Three times a day (TID) | ORAL | 0 refills | Status: AC | PRN
  Filled 2022-04-29: qty 15, 5d supply, fill #0

## 2022-04-29 NOTE — Assessment & Plan Note (Signed)
Stable No new symptoms  

## 2022-04-29 NOTE — Assessment & Plan Note (Signed)
Stable

## 2022-04-29 NOTE — Assessment & Plan Note (Signed)
Encourage heart healthy diet such as MIND or DASH diet, increase exercise, avoid trans fats, simple carbohydrates and processed foods, consider a krill or fish or flaxseed oil cap daily.  °

## 2022-04-29 NOTE — Progress Notes (Signed)
Subjective:   By signing my name below, I, Beverly Cole, attest that this documentation has been prepared under the direction and in the presence of Beverly, [name], DO. 04/29/2022   Patient ID: Beverly Cole, female    DOB: 1960/12/15, 62 y.o.   MRN: [id number]  Chief Complaint  Patient presents with   Annual Exam    Pt states fasting     HPI Patient is in today for a comprehensive physical exam.   She denies fever, new moles, congestion, sinus pain, sore throat, chest pain, palpitations, cough, shortness of breath, wheezing, nausea, vomiting, abdominal pain, diarrhea, constipation, dysuria, frequency, hematuria, new muscle pain, new joint pain, or headaches at this time.  She has no changes to family medical history.  She is UTD on the flu and Covid-19 vaccine.  She is not exercising regularly.  Mammogram was last completed 12/27/2021. Results showed Further evaluation is suggested for calcifications in the left breast.MM of left breast showed Benign LEFT breast calcifications. Repeat in 1 year.  Bone density was last completed 08/04/2019. Results showed she is osteopenic. Repeat in 2 years.  Colonoscopy was last completed 08/28/2021. Results showed diverticulosis in the entire examined colon. Repeat in 1 year.    Past Medical History:  Diagnosis Date   Anxiety    Atrial fibrillation ([place])    a. Event monitor 2013 - PACs/bradycardia/SVT/short run of atrial fib by event monitor.    BIPOLAR DISORDER UNSPECIFIED    Bradycardia    Bursitis of hip 09/1999   Bilateral - Dr. Berenice Primas   DEPRESSION    Emphysema lung ([place]) 06/20/2016   pt states pulmonalongist stated started recently   GERD    Headache(784.0)    was with menstrual cycle. no longer a problem   HYPERLIPIDEMIA    Hypertension    Hypertension    IRRITABLE BOWEL SYNDROME, HX OF    Menopausal state 01/2012   [facility] = 88.5   Mitral valve prolapse 12/17/2000   a. dx 1980s, most recent echo did not demonstrate this.    MYALGIA    Normal coronary arteries    a. by cardiac CT 2013.   PAT (paroxysmal atrial tachycardia)    PE (pulmonary embolism) 02/09/2015   a. Bilateral PEs 01/2015 when d-dimer 0.69, CP lying on left side. Followed by heme-onc - + lupus anticoagulant, mildly depressed protein S. Dr. Marin Olp is not certain if her hypercoagulable studies are significant for a thrombophilic state.   Premature atrial contractions    Pulmonary embolus ([place]) 02/10/2015   Shortness of breath    Pulmonary eval 05/2002   Thyroid nodule 2014   Tremors of nervous system     Past Surgical History:  Procedure Laterality Date   COLONOSCOPY     LAPAROSCOPIC APPENDECTOMY N/A 03/17/2017   Procedure: APPENDECTOMY LAPAROSCOPIC;  Surgeon: Excell Seltzer, MD;  Location: [facility];  Service: General;  Laterality: N/A;   Lipoma (R) side  1990   Right back    Family History  Problem Relation Age of Onset   Lung cancer Mother    Hypertension Mother    Hyperlipidemia Mother    Cancer Father        Esophageal cancer   Hyperlipidemia Father    Depression Father    Cancer Brother    Pulmonary embolism Brother    Colon cancer Paternal Uncle    Sarcoidosis Other    Testicular cancer Other     Social History   Socioeconomic History  Marital status: Married    Spouse name: Not on file   Number of children: 0   Years of education: Not on file   Highest education level: Not on file  Occupational History   Occupation: Nurse-Personal Care/HH    Employer: UNEMPLOYED  Tobacco Use   Smoking status: Former    Packs/day: 1.00    Years: 25.00    Additional pack years: 0.00    Total pack years: 25.00    Types: Cigarettes    Quit date: 02/11/1996    Years since quitting: 26.2   Smokeless tobacco: Never   Tobacco comments:    Married, lives with spouse. 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       No exercise   Social Determinants of Health   Financial Resource Strain: Not on file  Food Insecurity: Not on file  Transportation Needs: Not on file  Physical Activity: Not on file  Stress: Not on file  Social Connections: Not on file  Intimate Partner Violence: Not on file    Outpatient Medications Prior to Visit  Medication Sig Dispense Refill   ALPRAZolam (XANAX) 0.5 MG tablet TAKE 1 TABLET (0.5 MG TOTAL) BY MOUTH 2 (TWO) TIMES DAILY AS NEEDED FOR ANXIETY. TAKE 1 TABLET 30 MINUTES PRIOR TO PROCEDURE 30 tablet 0   aspirin EC 325 MG tablet Take 325 mg by mouth daily.     clobetasol ointment (TEMOVATE) 0.05 % Apply topically to nail, proximal nail fold daily on weekends only. Avoid applying to face, groin, and axilla. Use as directed. 30 g 3   dexlansoprazole (DEXILANT) 60 MG capsule Take 1 capsule (60 mg total) by mouth daily. 30 capsule 2   Dextromethorphan-buPROPion ER (AUVELITY) 45-105 MG TBCR Take 1 tablet by mouth 2 (two) times daily. 180 tablet 0   diltiazem (CARTIA XT) 180 MG 24 hr capsule Take 1 capsule (180 mg total) by mouth daily. 90 capsule 0   fexofenadine (ALLEGRA ALLERGY) 180 MG tablet Take 1 tablet (180 mg total) by mouth daily.     fluticasone (FLONASE) 50 MCG/ACT nasal spray Place 2 sprays into the nose daily as needed. For allergies     gabapentin (NEURONTIN) 600 MG tablet Take 2 tablets (1,200 mg total) by mouth every evening. 180 tablet 0   lidocaine (XYLOCAINE) 5 % ointment Apply a pea sized amount topically 20 minutes prior to intercourse, wipe off just prior to intercourse. 30 g 1   modafinil (PROVIGIL) 200 MG tablet Take 1.5 tablets (300 mg total) by mouth in the morning. 135 tablet 0   NONFORMULARY OR COMPOUNDED ITEM sm Azelaic acid/ metronidazole/ivermectin  15%/1%/1% cream apply to face bid 1 each 5   OLANZapine (ZYPREXA) 5 MG tablet Take 1 tablet (5 mg total) by mouth at bedtime. 30 tablet 0   Probiotic Product (PROBIOTIC DAILY PO) Take 1 tablet by mouth daily.      propranolol (INDERAL) 20 MG tablet Take 1 tablet (20 mg total) by mouth 2 (two) times daily. 180 tablet 0   sertraline (ZOLOFT) 50 MG tablet Take 1.5 tablets (75 mg total) by mouth  daily. 135 tablet 0   tretinoin (RETIN-A) 0.025 % cream Apply topically at bedtime. 45 g 4   trimethoprim (TRIMPEX) 100 MG tablet take as directed after intercourse 30 tablet 2   Sod Picosulfate-Mag Ox-Cit Acd (CLENPIQ) 10-3.5-12 MG-GM -GM/175ML SOLN Use as directed per office and not instructions on packaging. Do not refrigerate. 350 mL 0   No facility-administered medications prior to visit.    Allergies  Allergen Reactions   Cariprazine Other (See Comments)    Patient becomes suicidal when taking this medication.  Patient becomes suicidal when taking this medication.    Metoprolol Other (See Comments)    Hallucinations hair falls out Hallucinations hair falls out   Nickel Rash   Iron Nausea And Vomiting    Review of Systems  Constitutional:  Negative for fever and malaise/fatigue.  HENT:  Negative for congestion, sinus pain and sore throat.   Eyes:  Negative for blurred vision.  Respiratory:  Negative for cough, shortness of breath and wheezing.   Cardiovascular:  Negative for chest pain, palpitations and leg swelling.  Gastrointestinal:  Negative for abdominal pain, blood in stool, constipation, diarrhea, nausea and vomiting.  Genitourinary:  Negative for dysuria, frequency and hematuria.  Musculoskeletal:  Negative for falls.       (-)new muscle pain (-)new joint pain  Skin:  Negative for rash.       (-)New moles  Neurological:  Negative for dizziness, loss of consciousness and headaches.  Endo/Heme/Allergies:  Negative for environmental allergies.  Psychiatric/Behavioral:  Negative  for depression. The patient is not nervous/anxious.        Objective:    Physical Exam Vitals and nursing note reviewed.  Constitutional:      General: She is not in acute distress.    Appearance: Normal appearance. She is not ill-appearing.  HENT:     Head: Normocephalic and atraumatic.     Right Ear: Tympanic membrane, ear canal and external ear normal.     Left Ear: Tympanic membrane, ear canal and external ear normal.  Eyes:     Extraocular Movements: Extraocular movements intact.     Pupils: Pupils are equal, round, and reactive to light.  Cardiovascular:     Rate and Rhythm: Normal rate and regular rhythm.     Heart sounds: Normal heart sounds. No murmur heard.    No gallop.  Pulmonary:     Effort: Pulmonary effort is normal. No respiratory distress.     Breath sounds: Normal breath sounds. No wheezing or rales.  Abdominal:     General: Bowel sounds are normal. There is no distension.     Palpations: Abdomen is soft.     Tenderness: There is no abdominal tenderness. There is no guarding.  Musculoskeletal:        General: Normal range of motion.  Skin:    General: Skin is warm and dry.  Neurological:     General: No focal deficit present.     Mental Status: She is alert and oriented to person, place, and time.  Psychiatric:        Judgment: Judgment normal.     BP 110/70 (BP Location: Right Arm, Patient Position: Sitting, Cuff Size: Normal)   Pulse 75   Temp 98.2 F (36.8 C) (Oral)   Resp 18   Ht 5\' 9"  (1.753 m)   Wt 177 lb 12.8 oz (80.6 kg)   LMP 05/11/2012   SpO2 95%   BMI 26.26 kg/m  Wt Readings from Last 3 Encounters:  04/29/22 177 lb 12.8 oz (80.6 kg)  07/17/21 178 lb (80.7 kg)  05/13/21 172 lb (78 kg)       Assessment & Plan:  Preventative health care Assessment & Plan: Ghm utd  Check labs  See AVS   Orders: -     CBC with Differential/Platelet -     Comprehensive metabolic panel -     Lipid panel -     TSH  Bipolar 2 disorder  (HCC) Assessment & Plan: Per psych   Protein S deficiency ([place])  Chronic saddle pulmonary embolism with acute cor pulmonale (HCC)  Hyperlipidemia, unspecified hyperlipidemia type Assessment & Plan: Encourage heart healthy diet such as MIND or DASH diet, increase exercise, avoid trans fats, simple carbohydrates and processed foods, consider a krill or fish or flaxseed oil cap daily.    Orders: -     Comprehensive metabolic panel -     Lipid panel  Primary hypertension Assessment & Plan: Well controlled, no changes to meds. Encouraged heart healthy diet such as the DASH diet and exercise as tolerated.    Orders: -     CBC with Differential/Platelet  Hypothyroidism, unspecified type Assessment & Plan: Check labs   Orders: -     TSH  Rosacea, acne Assessment & Plan: Stable    Acute right-sided low back pain without sciatica Assessment & Plan: Walks with cane  Renew pain med for flight to [place]   Orders: -     traMADol HCl; Take 1 tablet (50 mg total) by mouth every 8 (eight) hours as needed for up to 5 days.  Dispense: 15 tablet; Refill: 0  Polyp of colon, unspecified part of colon, unspecified type -     Ambulatory referral to Gastroenterology  History of pulmonary embolus - Dec 2017 Assessment & Plan: Stable  No new symptoms      I, Ann Held, DO, personally preformed the services described in this documentation.  All medical record entries made by the scribe were at my direction and in my presence.  I have reviewed the chart and discharge instructions (if applicable) and agree that the record reflects my personal performance and is accurate and complete. 04/29/2022   I,Beverly Cole,acting as a scribe for Ann Held, DO.,have documented all relevant documentation on the behalf of Kellianne Bowhay, DO,as directed by  Ann Held, DO while in the presence of Ann Held, DO.   Ann Held, DO

## 2022-04-29 NOTE — Assessment & Plan Note (Signed)
Well controlled, no changes to meds. Encouraged heart healthy diet such as the DASH diet and exercise as tolerated.  °

## 2022-04-29 NOTE — Assessment & Plan Note (Signed)
Walks with cane  Renew pain med for flight to [place]

## 2022-04-29 NOTE — Assessment & Plan Note (Signed)
Per psych 

## 2022-04-29 NOTE — Assessment & Plan Note (Signed)
Check labs 

## 2022-04-29 NOTE — Assessment & Plan Note (Signed)
Ghm utd Check labs See AVS 

## 2022-05-08 ENCOUNTER — Telehealth: Payer: Self-pay | Admitting: Psychiatry

## 2022-05-08 NOTE — Telephone Encounter (Signed)
Fax sent from [place] requires PA for Auvelity 45-105mg 

## 2022-05-12 ENCOUNTER — Other Ambulatory Visit (HOSPITAL_COMMUNITY): Payer: Self-pay

## 2022-05-16 ENCOUNTER — Ambulatory Visit (INDEPENDENT_AMBULATORY_CARE_PROVIDER_SITE_OTHER): Payer: 59 | Admitting: Psychology

## 2022-05-16 DIAGNOSIS — F331 Major depressive disorder, recurrent, moderate: Secondary | ICD-10-CM | POA: Diagnosis not present

## 2022-05-16 NOTE — Progress Notes (Signed)
[facility] Counselor/Therapist Progress Note  Patient ID: Beverly Cole, MRN: [id number],    Date: 05/16/2022  Time Spent: 2:00pm - 2:50pm   50 minutes   Treatment Type: Individual Therapy  Reported Symptoms: sadness  Mental Status Exam: Appearance:  Casual     Behavior: Appropriate  Motor: Normal  Speech/Language:  Normal Rate  Affect: Appropriate  Mood: normal  Thought process: normal  Thought content:   WNL  Sensory/Perceptual disturbances:   WNL  Orientation: oriented to person, place, time/date, and situation  Attention: Good  Concentration: Good  Memory: WNL  Fund of knowledge:  Good  Insight:   Good  Judgment:  Good  Impulse Control: Good   Risk Assessment: Danger to Self:  No Self-injurious Behavior: No Danger to Others: No Duty to Warn:no Physical Aggression / Violence:No  Access to Firearms a concern: No  Gang Involvement:No   Subjective: Pt present for face-to-face individual therapy via video Webex.  Pt consents to telehealth video session due to COVID 19 pandemic. Location of pt: home Location of therapist: home office.   Pt talked about her plans to go to [place] next week.  She and her husband are going to renew their wedding vows.   Pt talked about concerns about her husband's health.  He has to get his prostrate checked out bc of elevated levels.  Addressed pt's concerns.  Pt states she is feeling better since her medication adjustment.   Her mood is positive and she is excited about her [place] trip.   Worked on self care strategies.  Provided supportive therapy.    Interventions: Cognitive Behavioral Therapy and Insight-Oriented  Diagnosis: F33.1  Plan of Care: Recommend ongoing therapy.   Pt participated in setting treatment goals.  Plan to meet monthly.    Treatment Plan Client Abilities/Strengths  Pt is bright, engaging, and motivated for therapy.   Client Treatment Preferences  Individual therapy.  Client Statement of  Needs  Improve coping skills.  Symptoms  Depressed or irritable mood. Diminished interest in or enjoyment of activities. Lack of energy. Poor concentration and indecisiveness. Social withdrawal.  Problems Addressed  Unipolar Depression Goals 1. Alleviate depressive symptoms and return to previous level of effective functioning. 2. Appropriately grieve the loss in order to normalize mood and to return to previously adaptive level of functioning. Objective Learn and implement behavioral strategies to overcome depression. Target Date: 2022-10-30 Frequency: Biweekly  Progress: 50 Modality: individual  Related Interventions Engage the client in "behavioral activation," increasing his/her activity level and contact with sources of reward, while identifying processes that inhibit activation.  Use behavioral techniques such as instruction, rehearsal, role-playing, role reversal, as needed, to facilitate activity in the client's daily life; reinforce success. Assist the client in developing skills that increase the likelihood of deriving pleasure from behavioral activation (e.g., assertiveness skills, developing an exercise plan, less internal/more external focus, increased social involvement); reinforce success. Objective Identify important people in life, past and present, and describe the quality, good and poor, of those relationships. Target Date: 2022-10-30 Frequency: Biweekly  Progress: 50 Modality: individual  Related Interventions Conduct Interpersonal Therapy beginning with the assessment of the client's "interpersonal inventory" of important past and present relationships; develop a case formulation linking depression to grief, interpersonal role disputes, role transitions, and/or interpersonal deficits). Objective Learn and implement problem-solving and decision-making skills. Target Date: 2022-10-30 Frequency: Biweekly  Progress: 50 Modality: individual  Related  Interventions Conduct Problem-Solving Therapy using techniques such as psychoeducation, modeling, and role-playing to teach  client problem-solving skills (i.e., defining a problem specifically, generating possible solutions, evaluating the pros and cons of each solution, selecting and implementing a plan of action, evaluating the efficacy of the plan, accepting or revising the plan); role-play application of the problem-solving skill to a real life issue. Encourage in the client the development of a positive problem orientation in which problems and solving them are viewed as a natural part of life and not something to be feared, despaired, or avoided. 3. Develop healthy interpersonal relationships that lead to the alleviation and help prevent the relapse of depression. 4. Develop healthy thinking patterns and beliefs about self, others, and the world that lead to the alleviation and help prevent the relapse of depression. 5. Recognize, accept, and cope with feelings of depression. Diagnosis F33.1  Conditions For Discharge Achievement of treatment goals and objectives   Salomon Fick, LCSW

## 2022-05-19 ENCOUNTER — Telehealth: Payer: Self-pay

## 2022-05-19 NOTE — Telephone Encounter (Signed)
Prior Authorization submitted and approved for [facility] ER 45-105 mg tablet, 2 tablets daily effective 05/15/2022-05/14/2023 with [employer].

## 2022-05-22 ENCOUNTER — Ambulatory Visit: Payer: 59 | Admitting: Dermatology

## 2022-05-26 ENCOUNTER — Encounter: Payer: Self-pay | Admitting: Family Medicine

## 2022-05-27 ENCOUNTER — Telehealth: Payer: Self-pay

## 2022-05-27 NOTE — Telephone Encounter (Signed)
LVM for pt to call office to reschedule appt 4/25. Butch Penny., RMA

## 2022-06-01 ENCOUNTER — Other Ambulatory Visit: Payer: Self-pay | Admitting: Psychiatry

## 2022-06-01 DIAGNOSIS — R251 Tremor, unspecified: Secondary | ICD-10-CM

## 2022-06-01 NOTE — Telephone Encounter (Signed)
gabapentin for ok to continue RLS 1200 mg PM and disc SE.   If RLS is better with less olanzapine then can reduce gabapentin.   ? Dose of gabapentin.

## 2022-06-02 ENCOUNTER — Other Ambulatory Visit (HOSPITAL_COMMUNITY): Payer: Self-pay

## 2022-06-02 MED ORDER — PROPRANOLOL HCL 20 MG PO TABS
20.0000 mg | ORAL_TABLET | Freq: Two times a day (BID) | ORAL | 0 refills | Status: DC
  Filled 2022-06-02: qty 60, 30d supply, fill #0

## 2022-06-02 MED ORDER — GABAPENTIN 600 MG PO TABS
1200.0000 mg | ORAL_TABLET | Freq: Every evening | ORAL | 0 refills | Status: DC
  Filled 2022-06-02: qty 180, 90d supply, fill #0

## 2022-06-03 ENCOUNTER — Other Ambulatory Visit: Payer: Self-pay

## 2022-06-03 ENCOUNTER — Other Ambulatory Visit (HOSPITAL_COMMUNITY): Payer: Self-pay

## 2022-06-05 ENCOUNTER — Encounter: Payer: Self-pay | Admitting: Dermatology

## 2022-06-05 ENCOUNTER — Ambulatory Visit: Payer: 59 | Admitting: Dermatology

## 2022-06-06 ENCOUNTER — Other Ambulatory Visit: Payer: Self-pay | Admitting: Family Medicine

## 2022-06-06 ENCOUNTER — Encounter: Payer: Self-pay | Admitting: Dermatology

## 2022-06-06 DIAGNOSIS — I10 Essential (primary) hypertension: Secondary | ICD-10-CM

## 2022-06-09 ENCOUNTER — Other Ambulatory Visit: Payer: Self-pay | Admitting: Psychiatry

## 2022-06-09 ENCOUNTER — Other Ambulatory Visit (HOSPITAL_COMMUNITY): Payer: Self-pay

## 2022-06-09 DIAGNOSIS — F331 Major depressive disorder, recurrent, moderate: Secondary | ICD-10-CM

## 2022-06-09 MED ORDER — OLANZAPINE 5 MG PO TABS
5.0000 mg | ORAL_TABLET | Freq: Every day | ORAL | 0 refills | Status: DC
  Filled 2022-06-09: qty 90, 90d supply, fill #0

## 2022-06-09 MED ORDER — DILTIAZEM HCL ER COATED BEADS 180 MG PO CP24
180.0000 mg | ORAL_CAPSULE | Freq: Every day | ORAL | 1 refills | Status: DC
  Filled 2022-06-09: qty 90, 90d supply, fill #0
  Filled 2022-09-03: qty 90, 90d supply, fill #1

## 2022-06-09 MED ORDER — AUVELITY 45-105 MG PO TBCR
1.0000 | EXTENDED_RELEASE_TABLET | Freq: Two times a day (BID) | ORAL | 0 refills | Status: DC
  Filled 2022-06-09: qty 180, 90d supply, fill #0

## 2022-06-10 ENCOUNTER — Other Ambulatory Visit: Payer: Self-pay

## 2022-06-10 ENCOUNTER — Other Ambulatory Visit (HOSPITAL_COMMUNITY): Payer: Self-pay

## 2022-06-16 ENCOUNTER — Other Ambulatory Visit: Payer: Self-pay

## 2022-06-19 ENCOUNTER — Ambulatory Visit: Payer: 59 | Admitting: Psychology

## 2022-06-20 ENCOUNTER — Ambulatory Visit (INDEPENDENT_AMBULATORY_CARE_PROVIDER_SITE_OTHER): Payer: 59 | Admitting: Family Medicine

## 2022-06-20 ENCOUNTER — Ambulatory Visit (HOSPITAL_BASED_OUTPATIENT_CLINIC_OR_DEPARTMENT_OTHER)
Admission: RE | Admit: 2022-06-20 | Discharge: 2022-06-20 | Disposition: A | Discharge status: Home / Self Care | Payer: 59 | Source: Ambulatory Visit | Attending: Family Medicine | Admitting: Family Medicine

## 2022-06-20 ENCOUNTER — Other Ambulatory Visit (HOSPITAL_BASED_OUTPATIENT_CLINIC_OR_DEPARTMENT_OTHER): Payer: Self-pay

## 2022-06-20 VITALS — BP 124/80 | HR 81 | Temp 97.4°F | Resp 18 | Ht 69.0 in | Wt 177.0 lb

## 2022-06-20 DIAGNOSIS — M5137 Other intervertebral disc degeneration, lumbosacral region: Secondary | ICD-10-CM | POA: Diagnosis not present

## 2022-06-20 DIAGNOSIS — M545 Low back pain, unspecified: Secondary | ICD-10-CM

## 2022-06-20 MED ORDER — PREDNISONE 10 MG PO TABS
ORAL_TABLET | ORAL | 0 refills | Status: AC
Start: 2022-06-20 — End: 2022-07-02
  Filled 2022-06-20: qty 20, 12d supply, fill #0

## 2022-06-20 MED ORDER — KETOROLAC TROMETHAMINE 60 MG/2ML IM SOLN
60.0000 mg | Freq: Once | INTRAMUSCULAR | Status: AC
Start: 2022-06-20 — End: 2022-06-20
  Administered 2022-06-20: 60 mg via INTRAMUSCULAR

## 2022-06-20 NOTE — Progress Notes (Signed)
Established Patient Office Visit  Subjective   Patient ID: Beverly Cole, female    DOB: Jul 11, 1960  Age: 62 y.o. MRN: [id number]  Chief Complaint  Patient presents with   Back Pain    Pt states having glute pain that started in her back on the left side.   HPI  Pt is here c/o low back pain x several months but has worsened over last few weeks.      Patient Active Problem List   Diagnosis Date Noted   Acute left-sided low back pain without sciatica 04/29/2022   Polyp of colon 04/29/2022   Abnormal weight gain 07/17/2021   Preventative health care 07/17/2021   Diverticular disease of colon 07/17/2021   Dysphagia 07/17/2021   Family history of malignant neoplasm of digestive organs 07/17/2021   Family history of malignant neoplasm of digestive organ 07/17/2021   Second degree hemorrhoids 07/17/2021   Change in bowel habit 07/17/2021   Protein S deficiency (HCC) 12/10/2020   Thyroid nodule 12/10/2020   Other fatigue 12/10/2020   COVID-19 virus infection 07/16/2020   Chronic left shoulder pain 03/13/2020   RUQ pain 07/04/2019   Rosacea, acne 06/29/2019   Hyperlipidemia LDL goal <100 05/26/2017   Acute perforated appendicitis s/p lap appendectomy 03/17/2017 03/16/2017   History of pulmonary embolus - Dec 2017 03/16/2017   Lupus anticoagulant positive 03/16/2017   Irritable bowel syndrome (IBS) 03/16/2017   Anxiety and depression 03/16/2017   Need for shingles vaccine 11/25/2016   SOB (shortness of breath) 04/25/2016   Chronic saddle pulmonary embolism with acute cor pulmonale (HCC)    Chest pain 02/09/2015   Severe recurrent major depression without psychotic features (HCC)    Goiter, nontoxic, multinodular 12/18/2014   Tremor 05/09/2012   SVT (supraventricular tachycardia) 12/03/2011   Hypertension 08/26/2010   Memory loss 04/08/2010   Hyperlipidemia 03/13/2009   Bipolar 2 disorder (HCC) 03/13/2009   Hypothyroidism 07/06/2008   GERD 11/05/2006   ACNE NEC 11/05/2006    DEPRESSION 07/23/2006   Past Medical History:  Diagnosis Date   Anxiety    Atrial fibrillation (HCC)    a. Event monitor 2013 - PACs/bradycardia/SVT/short run of atrial fib by event monitor.    BIPOLAR DISORDER UNSPECIFIED    Bradycardia    Bursitis of hip 09/1999   Bilateral - Dr. Luiz Blare   DEPRESSION    Emphysema lung (HCC) 06/20/2016   pt states pulmonalongist stated started recently   GERD    Headache(784.0)    was with menstrual cycle. no longer a problem   HYPERLIPIDEMIA    Hypertension    Hypertension    IRRITABLE BOWEL SYNDROME, HX OF    Menopausal state 01/2012   [facility] = 88.5   Mitral valve prolapse 12/17/2000   a. dx 1980s, most recent echo did not demonstrate this.   MYALGIA    Normal coronary arteries    a. by cardiac CT 2013.   PAT (paroxysmal atrial tachycardia)    PE (pulmonary embolism) 02/09/2015   a. Bilateral PEs 01/2015 when d-dimer 0.69, CP lying on left side. Followed by heme-onc - + lupus anticoagulant, mildly depressed protein S. Dr. Myna Hidalgo is not certain if her hypercoagulable studies are significant for a thrombophilic state.   Premature atrial contractions    Pulmonary embolus (HCC) 02/10/2015   Shortness of breath    Pulmonary eval 05/2002   Thyroid nodule 2014   Tremors of nervous system    Past Surgical History:  Procedure Laterality Date  COLONOSCOPY     LAPAROSCOPIC APPENDECTOMY N/A 03/17/2017   Procedure: APPENDECTOMY LAPAROSCOPIC;  Surgeon: Glenna Fellows, MD;  Location: [facility];  Service: General;  Laterality: N/A;   Lipoma (R) side  1990   Right back   Social History   Tobacco Use   Smoking status: Former    Packs/day: 1.00    Years: 25.00    Additional pack years: 0.00    Total pack years: 25.00    Types: Cigarettes    Quit date: 02/11/1996    Years since quitting: 26.3   Smokeless tobacco: Never   Tobacco comments:    Married, lives with spouse. 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  Vaping Use   Vaping Use: Never used   Substance Use Topics   Alcohol use: Yes    Comment: occ   Drug use: No   Social History   Socioeconomic History   Marital status: Married    Spouse name: Not on file   Number of children: 0   Years of education: Not on file   Highest education level: Associate degree: academic program  Occupational History   Occupation: Nurse-Personal Care/[employer]    Employer: UNEMPLOYED  Tobacco Use   Smoking status: Former    Packs/day: 1.00    Years: 25.00    Additional pack years: 0.00    Total pack years: 25.00    Types: Cigarettes    Quit date: 02/11/1996    Years since quitting: 26.3   Smokeless tobacco: Never   Tobacco comments:    Married, lives with spouse. 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       No exercise   Social Determinants of Health   Financial Resource Strain: Low Risk  (06/20/2022)   Overall Financial Resource Strain (CARDIA)    Difficulty of Paying Living Expenses: Not hard at all  Food Insecurity: No Food Insecurity (06/20/2022)   Hunger Vital Sign    Worried About Running Out of Food in the Last Year: Never true    Ran Out of Food in the Last Year: Never true  Transportation Needs: No Transportation Needs (06/20/2022)   PRAPARE - Administrator, Civil Service (Medical): No    Lack of Transportation (Non-Medical): No  Physical Activity: Unknown (06/20/2022)   Exercise Vital Sign    Days of Exercise per Week: 0 days    Minutes of Exercise per Session: Not on file  Stress: Stress Concern Present (06/20/2022)   [employer] of Occupational Health - Occupational Stress Questionnaire    Feeling of Stress : Very much  Social Connections:  Socially Isolated (06/20/2022)   Social Connection and Isolation Panel [NHANES]    Frequency of Communication with Friends and Family: Never    Frequency of Social Gatherings with Friends and Family: Never    Attends Religious Services: Never    Database administrator or Organizations: No    Attends Engineer, structural: Not on file    Marital Status: Married  Catering manager Violence: Not on file   Family Status  Relation Name Status  Mother  Deceased       lung CA   Father  Deceased       esophageal CA   Brother  Alive       testicular CA   Nutritional therapist  (Not Specified)   Other  (Not Specified)   Family History  Problem Relation Age of Onset   Lung cancer Mother    Hypertension Mother    Hyperlipidemia Mother    Cancer Father        Esophageal cancer   Hyperlipidemia Father    Depression Father    Cancer Brother    Pulmonary embolism Brother    Colon cancer Paternal Uncle    Sarcoidosis Other    Testicular cancer Other    Allergies  Allergen Reactions   Cariprazine Other (See Comments)    Patient becomes suicidal when taking this medication.  Patient becomes suicidal when taking this medication.    Metoprolol Other (See Comments)    Hallucinations hair falls out Hallucinations hair falls out   Nickel Rash   Iron Nausea And Vomiting      Review of Systems  Constitutional:  Negative for fever and malaise/fatigue.  HENT:  Negative for congestion.   Eyes:  Negative for blurred vision.  Respiratory:  Negative for cough and shortness of breath.   Cardiovascular:  Negative for chest pain, palpitations and leg swelling.  Gastrointestinal:  Negative for vomiting.  Musculoskeletal:  Positive for back pain.  Skin:  Negative for rash.  Neurological:  Positive for weakness. Negative for loss of consciousness and headaches.      Objective:     BP 124/80 (BP Location: Right Arm, Patient Position: Sitting, Cuff Size: Normal)   Pulse 81   Temp (!) 97.4 F  (36.3 C) (Oral)   Resp 18   Ht 5\' 9"  (1.753 m)   Wt 177 lb (80.3 kg)   LMP 05/11/2012   SpO2 96%   BMI 26.14 kg/m  BP Readings from Last 3 Encounters:  06/20/22 124/80  04/29/22 110/70  07/17/21 122/74   Wt Readings from Last 3 Encounters:  06/20/22 177 lb (80.3 kg)  04/29/22 177 lb 12.8 oz (80.6 kg)  07/17/21 178 lb (80.7 kg)   SpO2 Readings from Last 3 Encounters:  06/20/22 96%  04/29/22 95%  07/17/21 99%      Physical Exam Vitals and nursing note reviewed.  Constitutional:      Appearance: She is well-developed.  HENT:     Head: Normocephalic and atraumatic.  Eyes:     Conjunctiva/sclera: Conjunctivae normal.  Neck:     Thyroid: No thyromegaly.     Vascular: No carotid bruit or JVD.  Cardiovascular:     Rate and Rhythm: Normal rate and regular rhythm.     Heart sounds: Normal heart sounds. No murmur heard. Pulmonary:     Effort: Pulmonary effort is normal. No respiratory distress.     Breath sounds: Normal breath sounds. No wheezing or rales.  Chest:     Chest wall: No tenderness.  Musculoskeletal:        General: Tenderness present.     Cervical back: Normal range of motion and neck supple.     Lumbar back: Tenderness present. No spasms. Positive left straight leg raise test.     Comments: L hip flexor 3/5  R hip flexor normal  Knee ext normal b/l   Neurological:     Mental Status: She is alert and oriented to person, place, and time.  Motor: Weakness present. No tremor.     Deep Tendon Reflexes: Reflexes normal.     No results found for any visits on 06/20/22.  Last CBC Lab Results  Component Value Date   WBC 9.6 04/29/2022   HGB 12.3 04/29/2022   HCT 38.1 04/29/2022   MCV 77.7 (L) 04/29/2022   MCH 28.0 04/05/2020   RDW 17.8 (H) 04/29/2022   PLT 380.0 04/29/2022   Last metabolic panel Lab Results  Component Value Date   GLUCOSE 116 (H) 04/29/2022   NA 142 04/29/2022   K 4.2 04/29/2022   CL 107 04/29/2022   CO2 24 04/29/2022   BUN  14 04/29/2022   CREATININE 0.84 04/29/2022   GFRNONAA 76 04/05/2020   CALCIUM 9.1 04/29/2022   PROT 7.2 04/29/2022   ALBUMIN 4.2 04/29/2022   LABGLOB 2.6 04/05/2020   AGRATIO 1.8 04/05/2020   BILITOT 0.2 04/29/2022   ALKPHOS 94 04/29/2022   AST 18 04/29/2022   ALT 17 04/29/2022   ANIONGAP 8 12/20/2019   Last lipids Lab Results  Component Value Date   CHOL 282 (H) 04/29/2022   HDL 42.50 04/29/2022   LDLCALC 154 (H) 11/26/2017   LDLDIRECT 200.0 04/29/2022   TRIG 312.0 (H) 04/29/2022   CHOLHDL 7 04/29/2022   Last hemoglobin A1c Lab Results  Component Value Date   HGBA1C 5.3 03/20/2009   Last thyroid functions Lab Results  Component Value Date   TSH 5.42 04/29/2022   T4TOTAL 7.5 12/04/2020   Last vitamin D Lab Results  Component Value Date   VD25OH 26 (L) 03/20/2009   Last vitamin B12 and Folate Lab Results  Component Value Date   VITAMINB12 429 12/04/2020      The 10-year ASCVD risk score (Arnett DK, et al., 2019) is: 7.4%    Assessment & Plan:   Problem List Items Addressed This Visit       Unprioritized   Acute left-sided low back pain without sciatica - Primary    Toradol 60 mg IM Pred taper  Xray today  Return to office as needed       Relevant Medications   predniSONE (DELTASONE) 10 MG tablet   Other Relevant Orders   DG Lumbar Spine Complete    No follow-ups on file.    Donato Schultz, DO

## 2022-06-20 NOTE — Assessment & Plan Note (Signed)
Toradol 60 mg IM Pred taper  Xray today  Return to office as needed

## 2022-06-24 ENCOUNTER — Other Ambulatory Visit (HOSPITAL_COMMUNITY): Payer: Self-pay

## 2022-06-24 ENCOUNTER — Telehealth: Payer: 59 | Admitting: Psychiatry

## 2022-06-24 ENCOUNTER — Encounter: Payer: Self-pay | Admitting: Psychiatry

## 2022-06-24 DIAGNOSIS — R251 Tremor, unspecified: Secondary | ICD-10-CM

## 2022-06-24 DIAGNOSIS — F40240 Claustrophobia: Secondary | ICD-10-CM | POA: Diagnosis not present

## 2022-06-24 DIAGNOSIS — G2581 Restless legs syndrome: Secondary | ICD-10-CM | POA: Diagnosis not present

## 2022-06-24 DIAGNOSIS — F4001 Agoraphobia with panic disorder: Secondary | ICD-10-CM | POA: Diagnosis not present

## 2022-06-24 DIAGNOSIS — G250 Essential tremor: Secondary | ICD-10-CM

## 2022-06-24 DIAGNOSIS — F331 Major depressive disorder, recurrent, moderate: Secondary | ICD-10-CM

## 2022-06-24 DIAGNOSIS — F4000 Agoraphobia, unspecified: Secondary | ICD-10-CM

## 2022-06-24 DIAGNOSIS — F411 Generalized anxiety disorder: Secondary | ICD-10-CM | POA: Diagnosis not present

## 2022-06-24 MED ORDER — ALPRAZOLAM 0.5 MG PO TABS
0.5000 mg | ORAL_TABLET | Freq: Two times a day (BID) | ORAL | 0 refills | Status: AC | PRN
Start: 2022-06-24 — End: ?
  Filled 2022-06-24: qty 30, 15d supply, fill #0

## 2022-06-24 MED ORDER — MODAFINIL 200 MG PO TABS
300.0000 mg | ORAL_TABLET | Freq: Every morning | ORAL | 0 refills | Status: DC
Start: 2022-06-24 — End: 2022-10-05
  Filled 2022-06-24 – 2022-07-08 (×2): qty 135, 90d supply, fill #0

## 2022-06-24 MED ORDER — PROPRANOLOL HCL 20 MG PO TABS
20.0000 mg | ORAL_TABLET | Freq: Two times a day (BID) | ORAL | 0 refills | Status: DC
Start: 2022-06-24 — End: 2022-10-05
  Filled 2022-06-24 – 2022-07-08 (×2): qty 180, 90d supply, fill #0

## 2022-06-24 MED ORDER — SERTRALINE HCL 50 MG PO TABS
75.0000 mg | ORAL_TABLET | Freq: Every day | ORAL | 0 refills | Status: DC
Start: 2022-06-24 — End: 2022-10-05
  Filled 2022-06-24: qty 135, 90d supply, fill #0

## 2022-06-24 MED ORDER — OLANZAPINE 5 MG PO TABS
5.0000 mg | ORAL_TABLET | Freq: Every day | ORAL | 0 refills | Status: AC
Start: 2022-06-24 — End: ?
  Filled 2022-06-24 – 2022-09-03 (×2): qty 90, 90d supply, fill #0

## 2022-06-24 NOTE — Progress Notes (Signed)
Beverly Cole [id number] 1960/04/19 62 y.o.  Video Visit via My Chart  I connected with pt by video using My Chart and verified that I am speaking with the correct person using two identifiers.   I discussed the limitations, risks, security and privacy concerns of performing an evaluation and management service by My Chart  and the availability of in person appointments. I also discussed with the patient that there may be a patient responsible charge related to this service. The patient expressed understanding and agreed to proceed.  I discussed the assessment and treatment plan with the patient. The patient was provided an opportunity to ask questions and all were answered. The patient agreed with the plan and demonstrated an understanding of the instructions.   The patient was advised to call back or seek an in-person evaluation if the symptoms worsen or if the condition fails to improve as anticipated.  I provided 30 minutes of video time during this encounter.  The patient was located at home and the provider was located office. Session 400-430 pm  Subjective:   Patient ID:  Beverly Cole is a 62 y.o. (DOB Jan 23, 1961) female.  Chief Complaint:  Chief Complaint  Patient presents with   Follow-up   Depression   Anxiety   Fatigue    Depression        Associated symptoms include decreased concentration and fatigue.  Associated symptoms include no suicidal ideas.  Past medical history includes anxiety.   Anxiety Symptoms include decreased concentration, dizziness, nervous/anxious behavior and shortness of breath. Patient reports no chest pain, confusion or suicidal ideas.     [name]  today for follow-up of chronic depression and anxiety.  Lately depression worse than anxiety.  Seen with husband today  When seen May 11, 2018.  For persistent anxiety we elected to retry buspirone and try increasing it to 30 mg twice daily if tolerated.   Couldn't tolerate it this time DT  muscle spasms.  Pharmacy called saying she could have serotonin syndrome.  When seen August 09, 2018 and she refused med changes despite chronic anxiety and depression. She remained on sertraline 50, Depakote ER 1000 mg, Wellbutrin SR 200 mg AM  Panic wearing cloth masks.  Using a shield.  Not in the office today bc H exposed to Covid.    seen December 09, 2018.  Because of chronic depression and dysfunction with fatigue and poor motivation we decided off label trial  trial of modafinil 200 mg 1/2 each am for 1 week then 200 mg each AM. Did see benefit from it.  More energy and working to stay out of bed more.    seen January 10, 2019.  The modafinil had been helpful.  No meds were changed.  seen April 07, 2019.  The following was noted: Still sees benefit from modafinil but still not caring for herself like she should.  Not wanting to go anywhere.  Not driven since early fall.  Hard to breathe with the mask and it creates anxiety.  Hard to be in enclosed spaces like cars and planes for extended periods.   Trying to set her alarm clock.  No hallucinations.  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  Plans to send more too.  Still follow through is not great.  Still depression and productivity is still poor but it is not as poor as it was before modafinil. Plan increase  modafinil to 300 mg daily to see if function can be improved.  06/29/2019 appointment, the following is noted: Rare Xanax.  Did increase modafinil to 300 mg daily. Saw benefit with the increase with less time in bed but still markedly functionally impaired.   It seems like I get used to it and then doesn't maintain her get up and go.  Still having a hard time with self care like hygiene. Chronic low motivation and lack of interest is unchanged.   Gall bladder problems.  It's better at the moment.    Not more sad, just like I've always been.  Can't make herself  leave the house DT depression and anxiety.  She won't do chores.   Less excess sleeping.  He works from home 2 days weekly.  No periods of hyperactivity or manic sx since the spring. Attending therapy every 2 weeks.   Can be confused when first wakes up regardless of time of day. Had episode of hallucination in the middle of the night when awakened. Scarecrow frightened her. This is rare event.  No hallucinations during the day.  Melatonin makes it worse. Has had fearful thoughts of planes crashing or bad things happening to others and it might be her fault.  Fight with brother lately and not speaking with him now. Plan:  No med changes  10/11/19 appt with the following noted: Unsteady and tremors gotten worse.  Not gone to doctor. Propranolol not helping as much.   On Depakote ER '1000mg'$  HS which is a reduction. So tired all the time and H says she's not doing anything.  H says accomplishing littte and still lays in bed a lot.  H says it's 2-3 PM before she gets OOB.  Memory is poor.  Everything is such a chore and doesn't want to do things. H says she starts things she doesn't finish.  Lots of new projects.   Increase modafinil on her own to 400 mg daily in the AM.  Anxious.  Rare Xanax.  Was happy when got new cats in the summer.  Anxious and Depressed and describes anxiety as Moderate. Anxiety symptoms include: Excessive Worry,.  She thinks sertraline reduces her obsessiveness.  Past hx panic so avoidant. Never get to relax.  Pt reports sleeps excessively even with modafinil. Pt reports that appetite is good. Pt reports that energy is poor and loss of interest or pleasure in usual activities, poor motivation and withdrawn from usual activities. Just don't care about things and admits to a lot of general anxiety and everything takes a lot of energy out of her.  Compulsively tapes and watches TV shows, news and awards shows.  Can't delete it bc I'll miss something.  Watches TV in bed. Concentration is  poor. Suicidal thoughts:  denied by patient. But chronic death thoughts.  Poor productivity overall.  Chronic poor self care, showers only weekly.  Just don't care.  Chronic disability.  She thinks sertraline helped the anxiety some.  Likes that H is at home.  Helps her mood.  Doesn't care about going out. Feels faint when she tries to wear a mask.  Too anxious driving and is avoidant. Collects TV shows, she can't erase unless she watches entirely. Hoards some bc afraid she might need it some day.   Rare use of Xanax bc fears addiction. Plan: Attempted referral to neurology at [facility] neurology.  They refused to see patient stating they had nothing to offer her. It was suggested to patient that she  try to get in with Dr. Jannifer Franklin whom she had seen in the past.  01/10/2020 appointment with the following noted: Reduced modafinil to 300 mg bc didn't want to get hooked on anything about 2-3 weeks.  Did initially see benefit from the modafinil but seemed to get tolerant to it. Feels better not talking to brother bc differing views of politics.  Feels he's a negative person but never depressed. Overall thinks she's doing pretty well relatively.  However is chronically depressed and never happy.  Nothing changed with the meds. Depression worse than anxiety. H didn't think modafinil made much difference.  H CO her inactivity.. She felt it was helpful for energy initially. Had to cancel trips bc can't wear a mask for that long.  Makes her sad. Driven twice in a year or so DT anxiety. Wants prn Xanax. Plan: DC sertraline Start fluoxetine 20 mg daily with olanzapine 5 mg daily.  05/22/2020 appointment with following noted: Didn't remember to stop sertraline and took fluoxetine and olanzapine for 2 days and couldn't eat and stopped it. Then went to dermatologist December who told her couldn't take Zoloft with fungus med and stopped sertraline. Then became more depressed.  I have to go back on the  Zoloft. Remained on modafinil, Wellbutrin, Depakote.  Even less motivated to do anything like cook, clean.  Won't go to [employer] etc. Chronic depression with hopelessness to get better Plan: No med changes  07/31/2020 appointment with the following noted: Back on sertraline at 75 mg daily with Remained on modafinil, Wellbutrin, Depakote.  Aunt died and one of worst fears but not thinking about it. Family got Covid but she recovered.  She thought it might kill and didn't care. Tremors are getting worse and balance problems. Went to concert and that was a problem.  Enjoyed the concert. Also saw Kathrene Bongo.  Music always important to her.  1980 had planned to commit suicide listening to an album over and over but didn't attempt. B and M have familial tremor also. Still anxious and depressed and haven't driven in a year DT anxiety. Plan: Option of trial olanzapine alone without fluoxetine.  Rec retry.  She agrees.  10/23/2020 appointment with the following noted: "Olanzapine is really working."  Initial SE drunk at 5 mg HS so cut it in half.  Got used to it but I feel happy and more relaxed and easier time going to sleep.  If I could still chose to die I would but clearly happier but not more hopeful.  Laughing more.  No SI. Needs to care about things more.  Chronic lack of motivations. Still doesn't want to leave the house but did enjoy a concert in [place]. Chronic unsteadiness is worse.  Last disc with doctor in March who rec PT but she never did the PT.  Misses aunt who died.   She reduced the Depakote to 500 mg HS. When stopped sertraline was not doing well and restarted it bc stressed and unhappy.  Seems to be a key med for me. Pending card test soon.  Feels better than in the past. RLS is worse after starting olanzapine and even affecting arms but had it before.  Happens after lays down at night.  Takes MG which helps.   Xanax about once every 3 weeks. Plan: Successful trial  olanzapine 2.5 mg with sertraline 50 but worse RLS Trial gabapentin for RLS 100-300 mg PM and disc SE.  Higher if needed..  Once controlled consider increase olanzapine.  01/23/2021 appointment with the following noted: Taking gabapentin 300 mg but needs to go higher for RLS.  SE dizzy and reduced concentration. Reads on internet.   Increased olanzapine to 5 mg HS a couple of mos ago and feels better with it.  Happier.  More relaxed.  Only think that's helped in a long time. Handwriting is so much better, less shakey. Has RLS and can be bad when she lays down. Got a new kitten and enjoys it.  Now has 4 cats. Afraid to try new meds after negative reaction to Vraylar.  Afraid of any med change.  Afraid of having SI and afraid she'd do it if had SI.   Saw pulmonologist over weakness and he says she's OK except deconditioned.  Can't tolerate wearing the masks. Plan: Successful trial olanzapine 5 mg with sertraline 50 for depression and anxiety but worse RLS Consider increasing the olanzapine later if RLS can be managed. increase gabapentin for RLS 400-600 mg PM and disc SE.  Higher if needed..  Once controlled consider increase olanzapine.  03/28/2021 appointment with the following noted: Uses Ivory soap behind knee with benefit.  Increased gabapentin to 600 mg PM.  Dizziness resolved which occurred when turned over in bed.  Not a problem. RLS still 5/7 days.  But had no vacation RLS bc was walking more.  Not active at home. Sleep is great 8-9 hours and best in a long time. Enjoyed expensive vacation to [facility].  Once in a lifetime thing.  Some things she didn't like for example the cost. Went to [employer] concert and others. Depression is better.  Olanzapine really helped.  Didn't expect it.   Hasn't felt this good in years and H notices.   Caffeine 2 cups hot tea and limited. Plan: Successful trial olanzapine 5 mg with sertraline 50 for depression and anxiety but worse RLS Consider increasing  the olanzapine later if RLS can be managed. increase gabapentin for ok to increase RLS 600-900 mg PM and disc SE.  Higher if needed..  Once controlled consider increase olanzapine.  05/27/2021 phone call wanting to increase olanzapine to see if it will further help depression.  She had increased it on her own to 10 mg for a couple of weeks.  She has to increase to 15 mg.  This was agreed  06/26/21 appt noted: On sertraline 50 and olanzapine 15 mg HS with 1200 mg PM gabapentin for RLS RLS kicks in about 4 hours after olanzapine.  Prefers to stay up late bc doesn't really want to go to sleep.  RLS 5 days per week. Sleep 8 hours. Caffeine 2 cups hot tea and limited. Benefit from increase olanzapine. Helped anxiety and able to go to sleep earlier than usual.   Before trip was happier than I've been in years.  Not real happy with trip and now nothing to look forward to.   SE some dizziness with gabapentin.  But it's better.   Mood still better than before olanzapine. Plan: benefit olanzapine 15 mg with sertraline 50 for depression and anxiety but worse RLS She wants to Consider increasing the olanzapine later if RLS can be managed. OK increase olanzapine to 20 mg PM If RLS is worse then will have to reduce.  08/26/2021 appointment with the following noted: Increased olanzapine 20 mg HS.  Not sure if it made a difference.  Terrible time with memorfy ongoing. Sleep good.  Still has RLS and can get pain in her arm including yesterday at  6-7 PM.   Asks about Ozempic for OCD. Depends on H [name]. Anxiety is better and that helps sleep.  Depression better than it was.  No longer thinks of death all the time like she use to.  Still not very active.  Need something to look forward to. Stays up late and likes doing so bc less depressed in evening.   Plan continue olanzapine 20 mg daily with sertraline 75 mg daily  11/26/2021 appointment noted:  seen with Al Decant Memory is poor.  Forgets TV shows. Taking  alprazolam rarely. Overall thinks she is doing fine.  Still not driving much.  Don't like to leave the house but went outside on the deck some which is improvement. Sleeps 12 hours nightly but then up all day. Happier with olanazapine and thinks the higher doses did help the anxiety. H doesn't see any changes in the last year.  Covid messed her up and she has become housebound.  Not social.  She got used to being inside and no motivation to change it. H cleans and prepares meals.  Doesn't have motivation for chores. Plan: continue sertraline 50 for depression and anxiety  She wants to Consider increasing the olanzapine later if RLS can be managed. Reduce olanzapine to 10 mg HS bc unclear if higher dose helped more than lower. RLS is better with MG Modafinil 300 to 400 mg daily for alertness and off label for treatment resistant depression Trial Auvelity for TRD 1 in AM for 1 week, then 1 twice daily. DC Wellbutriin  12/25/2021 phone call requesting 90-day prescription of Auvelity stating that it was working well.  02/19/22 appt noted: Current psych med: rare Xanax, Auvelity BID, no fluoxetine, modafinil 300 mg AM , olanzapine 10 pm, propranolol 20 BID, sertraline 75 mg daily. Gabapentin 1200 mg daily I feel like doing things more and I see things more clearly.  Realizes now buying things to make myself feel happier and not really a buying problem per se.  Now not driven to buy things she shouldn't.   Feels embarrassed by what she did before with this.  Had bought a lot of expensive baskets and now no longer does that.  She is happier she's better and H notices it too.  Putting things away and better cleaning.  Tired a lot.  Better motivation..  had lunch with friends scheduled for Friday but is cancelling.  Not sure whay she is avoidant socially.  Anxious about it.  Almost fears a panic attack.  Not sure when had last panic but had them when she tried to go to work.  I can't.  Doesn't drive often.   Has a Zenaida Niece but doesn't like driving it.  Can't drive at night.   Still sleeps 12 hours in 24.   Happy the way I am right now. Some trouble with restless legs. Plan: no med changes Less depressed with Auvelity for TRD BID markedly  04/23/22 appt noted: "Haven't felt this good in years." Consistent with Auvelity BID.   Current psych meds: modafinil 300 Am, Auvelity BID, sertraline 75, olanzapine 10 mg HS, propranolol 20 BID. Asks about going off olanzapine. 2 cats put to sleep and she did well with it despite in past had SI when doing it. Still sleeps 12 hour daily bc so tired and wants to try stopping it. No RLS on gabapentin.  To [place] next month.   Plan: continue sertraline 75 for depression and anxiety  Less depressed with Auvelity for TRD BID  markedly, best in years. Ok trial reduction to olanzapine to 5  mg HS .  After 30 D if well can stop it. RLS is better with MG  06/24/22 appt noted: Not often with alprazolam other than sleep if needed.   Psych med: Auvelity BID , gabapentin 1200 pm for back pain, modafinil 300 AM, olanzapine 5 mg HS, sertraline 75 mg daily, propranolol 20 BID.   When reduced olanzapine from 10 to 5 then not as happy. But is less sleepy and tired and wants to stay on the lower dose for now. Overall still doing good and "very good".  Hurt back packing for [place] and still some back problems.  Went to [place].  Had a good time there.  Bad weather cancelled the snorkeling trip.   Overall satisfied with current problems.     Past psych med trials:  Wellbutrin XL 300, Sertraline 75, fluoxetine SE brief ,  Auvelity marked positive response Vraylar SI, Latuda, Abilify , olanzapine 20 Symbyax side effects Seroquel which caused side effects of constipation,   lamotrigine,  refuses lithium because of altered taste.   pramipexole,  Modafinil 300-400  Hx primidone sed Propranolol 20 BID helps tremor and anxiety  She has a history of ataxia on 1500 mg of Depakote.    buspirone SE hallucinations Gabapentin for RLS  ECT 2016 without help,   Review of Systems:  Review of Systems  Constitutional:  Positive for fatigue.  Respiratory:  Positive for shortness of breath.   Cardiovascular:  Negative for chest pain.  Gastrointestinal:  Positive for abdominal pain. Negative for vomiting.  Musculoskeletal:  Positive for gait problem.  Neurological:  Positive for dizziness and tremors. Negative for weakness.       RLS  Psychiatric/Behavioral:  Positive for decreased concentration, dysphoric mood and sleep disturbance. Negative for agitation, behavioral problems, confusion, hallucinations, self-injury and suicidal ideas. The patient is nervous/anxious. The patient is not hyperactive.     Medications: I have reviewed the patient's current medications.  Current Outpatient Medications  Medication Sig Dispense Refill   aspirin EC 325 MG tablet Take 325 mg by mouth daily.     clobetasol ointment (TEMOVATE) 0.05 % Apply topically to nail, proximal nail fold daily on weekends only. Avoid applying to face, groin, and axilla. Use as directed. 30 g 3   dexlansoprazole (DEXILANT) 60 MG capsule Take 1 capsule (60 mg total) by mouth daily. 30 capsule 2   Dextromethorphan-buPROPion ER (AUVELITY) 45-105 MG TBCR Take 1 tablet by mouth 2 (two) times daily. 180 tablet 0   diltiazem (CARTIA XT) 180 MG 24 hr capsule Take 1 capsule (180 mg total) by mouth daily. 90 capsule 1   fexofenadine (ALLEGRA ALLERGY) 180 MG tablet Take 1 tablet (180 mg total) by mouth daily.     fluticasone (FLONASE) 50 MCG/ACT nasal spray Place 2 sprays into the nose daily as needed. For allergies     gabapentin (NEURONTIN) 600 MG tablet Take 2 tablets (1,200 mg total) by mouth every evening. 180 tablet 0   lidocaine (XYLOCAINE) 5 % ointment Apply a pea sized amount topically 20 minutes prior to intercourse, wipe off just prior to intercourse. 30 g 1   NONFORMULARY OR COMPOUNDED ITEM sm Azelaic acid/  metronidazole/ivermectin 15%/1%/1% cream apply to face bid 1 each 5   predniSONE (DELTASONE) 10 MG tablet Take 3 tablets (30 mg total) daily for 3 days, THEN 2 tablets (20 mg total) daily for 3 days, THEN 1 tablet (10 mg total) daily  for 3 days, THEN 0.5 tablets (5 mg total) daily for 3 days. 20 tablet 0   Probiotic Product (PROBIOTIC DAILY PO) Take 1 tablet by mouth daily.      tretinoin (RETIN-A) 0.025 % cream Apply topically at bedtime. 45 g 4   trimethoprim (TRIMPEX) 100 MG tablet take as directed after intercourse 30 tablet 2   ALPRAZolam (XANAX) 0.5 MG tablet TAKE 1 TABLET (0.5 MG TOTAL) BY MOUTH 2 (TWO) TIMES DAILY AS NEEDED FOR ANXIETY. TAKE 1 TABLET 30 MINUTES PRIOR TO PROCEDURE 30 tablet 0   modafinil (PROVIGIL) 200 MG tablet Take 1.5 tablets (300 mg total) by mouth in the morning. 135 tablet 0   OLANZapine (ZYPREXA) 5 MG tablet Take 1 tablet (5 mg total) by mouth at bedtime. 90 tablet 0   propranolol (INDERAL) 20 MG tablet Take 1 tablet (20 mg total) by mouth 2 (two) times daily. 180 tablet 0   sertraline (ZOLOFT) 50 MG tablet Take 1.5 tablets (75 mg total) by mouth daily. 135 tablet 0   No current facility-administered medications for this visit.    Medication Side Effects: None  Allergies:  Allergies  Allergen Reactions   Cariprazine Other (See Comments)    Patient becomes suicidal when taking this medication.  Patient becomes suicidal when taking this medication.    Metoprolol Other (See Comments)    Hallucinations hair falls out Hallucinations hair falls out   Nickel Rash   Iron Nausea And Vomiting    Past Medical History:  Diagnosis Date   Anxiety    Atrial fibrillation (HCC)    a. Event monitor 2013 - PACs/bradycardia/SVT/short run of atrial fib by event monitor.    BIPOLAR DISORDER UNSPECIFIED    Bradycardia    Bursitis of hip 09/1999   Bilateral - Dr. Luiz Blare   DEPRESSION    Emphysema lung (HCC) 06/20/2016   pt states pulmonalongist stated started recently    GERD    Headache(784.0)    was with menstrual cycle. no longer a problem   HYPERLIPIDEMIA    Hypertension    Hypertension    IRRITABLE BOWEL SYNDROME, HX OF    Menopausal state 01/2012   [facility] = 88.5   Mitral valve prolapse 12/17/2000   a. dx 1980s, most recent echo did not demonstrate this.   MYALGIA    Normal coronary arteries    a. by cardiac CT 2013.   PAT (paroxysmal atrial tachycardia)    PE (pulmonary embolism) 02/09/2015   a. Bilateral PEs 01/2015 when d-dimer 0.69, CP lying on left side. Followed by heme-onc - + lupus anticoagulant, mildly depressed protein S. Dr. Myna Hidalgo is not certain if her hypercoagulable studies are significant for a thrombophilic state.   Premature atrial contractions    Pulmonary embolus (HCC) 02/10/2015   Shortness of breath    Pulmonary eval 05/2002   Thyroid nodule 2014   Tremors of nervous system     Family History  Problem Relation Age of Onset   Lung cancer Mother    Hypertension Mother    Hyperlipidemia Mother    Cancer Father        Esophageal cancer   Hyperlipidemia Father    Depression Father    Cancer Brother    Pulmonary embolism Brother    Colon cancer Paternal Uncle    Sarcoidosis Other    Testicular cancer Other     Social History   Socioeconomic History   Marital status: Married    Spouse name: Not on file  Number of children: 0   Years of education: Not on file   Highest education level: Associate degree: academic program  Occupational History   Occupation: Nurse-Personal Care/HH    Employer: UNEMPLOYED  Tobacco Use   Smoking status: Former    Packs/day: 1.00    Years: 25.00    Additional pack years: 0.00    Total pack years: 25.00    Types: Cigarettes    Quit date: 02/11/1996    Years since quitting: 26.3   Smokeless tobacco: Never   Tobacco comments:    Married, lives with spouse. 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       No exercise   Social Determinants of Health   Financial Resource Strain: Low Risk  (06/20/2022)   Overall Financial Resource Strain (CARDIA)    Difficulty of Paying Living Expenses: Not hard at all  Food Insecurity: No Food Insecurity (06/20/2022)   Hunger Vital Sign    Worried About Running Out of Food in the Last Year: Never true    Ran Out of Food in the Last Year: Never true  Transportation Needs: No Transportation Needs (06/20/2022)   PRAPARE - Administrator, Civil Service (Medical): No    Lack of Transportation (Non-Medical): No  Physical Activity: Unknown (06/20/2022)   Exercise Vital Sign    Days of Exercise per Week: 0 days    Minutes of Exercise per Session: Not on file  Stress: Stress Concern Present (06/20/2022)   [employer] of Occupational Health - Occupational Stress Questionnaire    Feeling of Stress : Very much  Social Connections: Socially Isolated (06/20/2022)   Social Connection and Isolation Panel [NHANES]    Frequency of Communication with Friends and Family: Never    Frequency of Social Gatherings with Friends and Family: Never    Attends Religious Services: Never    Database administrator or Organizations: No    Attends Engineer, structural: Not on file    Marital Status: Married  Catering manager Violence: Not on file    Past Medical History, Surgical history, Social history, and Family history were reviewed and updated as appropriate.   Please see review of systems for further details on the patient's review from today.   Objective:   Physical Exam:  LMP 05/11/2012   Physical Exam Neurological:     Mental Status: She is alert and oriented to person, place, and time.     Cranial Nerves:  No dysarthria.  Psychiatric:        Attention and Perception: Attention and perception normal.        Mood and Affect: Mood is anxious. Mood is not depressed.        Speech: Speech normal.        Behavior: Behavior is cooperative.        Thought Content: Thought content normal. Thought content is not paranoid or delusional. Thought content does not include homicidal or suicidal ideation. Thought content does not include suicidal plan.        Cognition and Memory: Cognition and memory normal.  Comments: Insight and judgment fair and less impulsive Chronic anxiety and avoidant Best mood in years. Still better with meds.     Lab Review:     Component Value Date/Time   NA 142 04/29/2022 1341   NA 145 (H) 04/05/2020 1508   NA 142 01/21/2017 1013   NA 143 01/16/2016 1005   K 4.2 04/29/2022 1341   K 3.2 (L) 01/21/2017 1013   K 3.3 (L) 01/16/2016 1005   CL 107 04/29/2022 1341   CL 106 01/21/2017 1013   CO2 24 04/29/2022 1341   CO2 29 01/21/2017 1013   CO2 20 (L) 01/16/2016 1005   GLUCOSE 116 (H) 04/29/2022 1341   GLUCOSE 95 01/21/2017 1013   BUN 14 04/29/2022 1341   BUN 14 04/05/2020 1508   BUN 9 01/21/2017 1013   BUN 12.6 01/16/2016 1005   CREATININE 0.84 04/29/2022 1341   CREATININE 0.94 12/20/2019 1449   CREATININE 0.9 01/21/2017 1013   CREATININE 0.9 01/16/2016 1005   CALCIUM 9.1 04/29/2022 1341   CALCIUM 9.2 01/21/2017 1013   CALCIUM 9.5 01/16/2016 1005   PROT 7.2 04/29/2022 1341   PROT 7.2 04/05/2020 1508   PROT 6.9 01/21/2017 1013   PROT 7.1 01/16/2016 1005   ALBUMIN 4.2 04/29/2022 1341   ALBUMIN 4.6 04/05/2020 1508   ALBUMIN 3.6 01/16/2016 1005   AST 18 04/29/2022 1341   AST 13 (L) 12/20/2019 1449   AST 16 01/16/2016 1005   ALT 17 04/29/2022 1341   ALT 15 12/20/2019 1449   ALT 22 01/21/2017 1013   ALT 24 01/16/2016 1005   ALKPHOS 94 04/29/2022 1341   ALKPHOS 62 01/21/2017 1013   ALKPHOS 81 01/16/2016 1005   BILITOT 0.2 04/29/2022 1341   BILITOT <0.2  04/05/2020 1508   BILITOT 0.3 12/20/2019 1449   BILITOT 0.59 01/16/2016 1005   GFRNONAA 76 04/05/2020 1508   GFRNONAA >60 12/20/2019 1449   GFRAA 87 04/05/2020 1508   GFRAA >60 12/03/2018 1346       Component Value Date/Time   WBC 9.6 04/29/2022 1341   RBC 4.90 04/29/2022 1341   HGB 12.3 04/29/2022 1341   HGB 13.7 04/05/2020 1508   HGB 12.8 01/21/2017 1013   HCT 38.1 04/29/2022 1341   HCT 41.4 04/05/2020 1508   HCT 38.2 01/21/2017 1013   PLT 380.0 04/29/2022 1341   PLT 289 04/05/2020 1508   MCV 77.7 (L) 04/29/2022 1341   MCV 85 04/05/2020 1508   MCV 90 01/21/2017 1013   MCH 28.0 04/05/2020 1508   MCH 28.9 12/20/2019 1449   MCHC 32.2 04/29/2022 1341   RDW 17.8 (H) 04/29/2022 1341   RDW 15.0 04/05/2020 1508   RDW 14.5 01/21/2017 1013   LYMPHSABS 2.8 04/29/2022 1341   LYMPHSABS 2.5 04/05/2020 1508   LYMPHSABS 3.6 (H) 01/21/2017 1013   MONOABS 0.5 04/29/2022 1341   EOSABS 0.2 04/29/2022 1341   EOSABS 0.1 04/05/2020 1508   EOSABS 0.5 01/21/2017 1013   BASOSABS 0.1 04/29/2022 1341   BASOSABS 0.1 04/05/2020 1508   BASOSABS 0.0 01/21/2017 1013    No results found for: "POCLITH", "LITHIUM"   Lab Results  Component Value Date   PHENYTOIN <0.5 ug/mL (L) 11/05/2006   VALPROATE 73.7 11/25/2016     .res Assessment: Plan:    Airiana was seen today for follow-up, depression, anxiety and fatigue.  Diagnoses and all orders for this visit:  Major depressive disorder, recurrent episode, moderate (HCC) -     modafinil (PROVIGIL) 200 MG  tablet; Take 1.5 tablets (300 mg total) by mouth in the morning. -     OLANZapine (ZYPREXA) 5 MG tablet; Take 1 tablet (5 mg total) by mouth at bedtime.  Generalized anxiety disorder  Panic disorder with agoraphobia -     ALPRAZolam (XANAX) 0.5 MG tablet; TAKE 1 TABLET (0.5 MG TOTAL) BY MOUTH 2 (TWO) TIMES DAILY AS NEEDED FOR ANXIETY. TAKE 1 TABLET 30 MINUTES PRIOR TO PROCEDURE -     sertraline (ZOLOFT) 50 MG tablet; Take 1.5 tablets (75 mg  total) by mouth daily.  Claustrophobia  Benign essential tremor  Uncontrolled restless legs syndrome  Tremor -     propranolol (INDERAL) 20 MG tablet; Take 1 tablet (20 mg total) by mouth 2 (two) times daily.  Agoraphobia -     ALPRAZolam (XANAX) 0.5 MG tablet; TAKE 1 TABLET (0.5 MG TOTAL) BY MOUTH 2 (TWO) TIMES DAILY AS NEEDED FOR ANXIETY. TAKE 1 TABLET 30 MINUTES PRIOR TO PROCEDURE   Poor sleep hygiene.  Improved with modafinil and motivation is better with it but wants to increase it.  TRD.  Lifelong history of chronic anhedonic depression and anxiety with very poor functioning continues.  Prognosis is guarded bc multiple med failures and her resistance to change and chronicity of sx and low motivation for change.  She was getting some benefit from the prior medications a little bit more active and interested and motivated with the addition of modafinil without side effects.  She is highly dysfunctional and much worse off the sertraline for months..  She is not currently manic.  She is tolerating the medications.  She is had multiple medication failures as noted above.  She is fearful of trying new medications because of a history of suicidal thoughts on Vraylar.   Failed all FDA approved bipolar depression meds except Symbyax.  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 Best response ever with current med regimen including with Auvelity addition.     continue sertraline 75 for depression and anxiety  Less depressed with Auvelity for TRD BID markedly, best in years. Ok with reduction to olanzapine to 5  mg HS but is a little less happy without lower dose but more alert.  Doesn't want to stop for reasons noted. RLS is better with MG  continue gabapentin for ok to continue RLS 1200 mg PM and disc SE but taking also for back pain.   If RLS is better with less olanzapine then  can reduce gabapentin. Caffeine can worsen RLS.  Disc timing in relation to olanzapine. Caution about excessive dizziness from gabapentin and fall risk.  Tolerated ok right now. RLS is not as bad as it was.   Discussed potential metabolic side effects associated with atypical antipsychotics, as well as potential risk for movement side effects. Advised pt to contact office if movement side effects occur.  For tremor is considering increasing the propranolol continue 20 BID She'll check with cardiologist on this.  Push fluids bc so much Time in bed and past history of dehydration. Disc risk propranolol but it helps tremor.  Increase activity.  Work on sleep schedule and try to regulate it for overall mental health benefits.  Keep working on improving activity.  Every little bit of progress can be additive over time.  Discussed the Cognitive behavioral approaches.  Doesn't drive much.  She is not motivated to do these things right now. Exposure behavioral therapy  discussed.    Modafinil would be likely safer than traditional stimulants which could cause psychotic sx.  Discussed side effects.  She has had none.   Continue other meds. Dont' increase further, Benefit 400 mg modafinil.  Okay to continue 300-400 mg daily  This appt was 30 mins.  FU 8 weeks  Meredith Staggers, MD, DFAPA   Please see After Visit Summary for patient specific instructions.  Future Appointments  Date Time Provider Department Center  07/02/2022  3:40 PM Neale Burly, [place], MD [facility] None  07/17/2022  3:00 PM Marlinda Mike, LCSW [facility] None  07/24/2022  3:30 PM Romualdo Bolk, MD [facility] None  01/22/2023  2:20 PM Moye, [place], MD [facility] None    No orders of the defined types were placed in this encounter.     -------------------------------

## 2022-07-01 ENCOUNTER — Other Ambulatory Visit: Payer: Self-pay | Admitting: Family Medicine

## 2022-07-01 ENCOUNTER — Encounter: Payer: Self-pay | Admitting: Family Medicine

## 2022-07-01 DIAGNOSIS — M545 Low back pain, unspecified: Secondary | ICD-10-CM

## 2022-07-01 NOTE — Telephone Encounter (Signed)
Pt requesting MRI order please

## 2022-07-02 ENCOUNTER — Other Ambulatory Visit (HOSPITAL_COMMUNITY): Payer: Self-pay

## 2022-07-02 ENCOUNTER — Ambulatory Visit (INDEPENDENT_AMBULATORY_CARE_PROVIDER_SITE_OTHER): Payer: 59 | Admitting: Dermatology

## 2022-07-02 DIAGNOSIS — L820 Inflamed seborrheic keratosis: Secondary | ICD-10-CM | POA: Diagnosis not present

## 2022-07-02 DIAGNOSIS — L603 Nail dystrophy: Secondary | ICD-10-CM

## 2022-07-02 DIAGNOSIS — L821 Other seborrheic keratosis: Secondary | ICD-10-CM

## 2022-07-02 DIAGNOSIS — L719 Rosacea, unspecified: Secondary | ICD-10-CM

## 2022-07-02 MED ORDER — TRETINOIN 0.025 % EX CREA
TOPICAL_CREAM | Freq: Every day | CUTANEOUS | 11 refills | Status: DC
  Filled 2022-07-02: qty 45, 30d supply, fill #0
  Filled 2022-08-20: qty 45, 30d supply, fill #1
  Filled 2022-11-20: qty 45, 30d supply, fill #2
  Filled 2022-12-30: qty 45, 30d supply, fill #3
  Filled 2023-01-28: qty 45, 30d supply, fill #4

## 2022-07-02 NOTE — Patient Instructions (Signed)
Due to recent changes in healthcare laws, you may see results of your pathology and/or laboratory studies on MyChart before the doctors have had a chance to review them. We understand that in some cases there may be results that are confusing or concerning to you. Please understand that not all results are received at the same time and often the doctors may need to interpret multiple results in order to provide you with the best plan of care or course of treatment. Therefore, we ask that you please give us 2 business days to thoroughly review all your results before contacting the office for clarification. Should we see a critical lab result, you will be contacted sooner.   If You Need Anything After Your Visit  If you have any questions or concerns for your doctor, please call our main line at [phone] and press option 4 to reach your doctor's medical assistant. If no one answers, please leave a voicemail as directed and we will return your call as soon as possible. Messages left after 4 pm will be answered the following business day.   You may also send us a message via MyChart. We typically respond to MyChart messages within 1-2 business days.  For prescription refills, please ask your pharmacy to contact our office. Our fax number is [phone].  If you have an urgent issue when the clinic is closed that cannot wait until the next business day, you can page your doctor at the number below.    Please note that while we do our best to be available for urgent issues outside of office hours, we are not available 24/7.   If you have an urgent issue and are unable to reach us, you may choose to seek medical care at your doctor's office, retail clinic, urgent care center, or emergency room.  If you have a medical emergency, please immediately call 911 or go to the emergency department.  Pager Numbers  - Dr. Kowalski: [phone]  - Dr. Moye: [phone]  - Dr. Stewart:  [phone]  In the event of inclement weather, please call our main line at [phone] for an update on the status of any delays or closures.  Dermatology Medication Tips: Please keep the boxes that topical medications come in in order to help keep track of the instructions about where and how to use these. Pharmacies typically print the medication instructions only on the boxes and not directly on the medication tubes.   If your medication is too expensive, please contact our office at [phone] option 4 or send us a message through MyChart.   We are unable to tell what your co-pay for medications will be in advance as this is different depending on your insurance coverage. However, we may be able to find a substitute medication at lower cost or fill out paperwork to get insurance to cover a needed medication.   If a prior authorization is required to get your medication covered by your insurance company, please allow us 1-2 business days to complete this process.  Drug prices often vary depending on where the prescription is filled and some pharmacies may offer cheaper prices.  The website www.goodrx.com contains coupons for medications through different pharmacies. The prices here do not account for what the cost may be with help from insurance (it may be cheaper with your insurance), but the website can give you the price if you did not use any insurance.  - You can print the associated coupon and take it with   your prescription to the pharmacy.  - You may also stop by our office during regular business hours and pick up a GoodRx coupon card.  - If you need your prescription sent electronically to a different pharmacy, notify our office through [place] MyChart or by phone at [phone] option 4.     Si Usted Necesita Algo Despus de Su Visita  Tambin puede enviarnos un mensaje a travs de MyChart. Por lo general respondemos a los mensajes de MyChart en el transcurso de 1 a 2  das hbiles.  Para renovar recetas, por favor pida a su farmacia que se ponga en contacto con nuestra oficina. Nuestro nmero de fax es el [phone].  Si tiene un asunto urgente cuando la clnica est cerrada y que no puede esperar hasta el siguiente da hbil, puede llamar/localizar a su doctor(a) al nmero que aparece a continuacin.   Por favor, tenga en cuenta que aunque hacemos todo lo posible para estar disponibles para asuntos urgentes fuera del horario de oficina, no estamos disponibles las 24 horas del da, los 7 das de la semana.   Si tiene un problema urgente y no puede comunicarse con nosotros, puede optar por buscar atencin mdica  en el consultorio de su doctor(a), en una clnica privada, en un centro de atencin urgente o en una sala de emergencias.  Si tiene una emergencia mdica, por favor llame inmediatamente al 911 o vaya a la sala de emergencias.  Nmeros de bper  - Dr. Kowalski: [phone]  - Dra. Moye: [phone]  - Dra. Stewart: [phone]  En caso de inclemencias del tiempo, por favor llame a nuestra lnea principal al [phone] para una actualizacin sobre el estado de cualquier retraso o cierre.  Consejos para la medicacin en dermatologa: Por favor, guarde las cajas en las que vienen los medicamentos de uso tpico para ayudarle a seguir las instrucciones sobre dnde y cmo usarlos. Las farmacias generalmente imprimen las instrucciones del medicamento slo en las cajas y no directamente en los tubos del medicamento.   Si su medicamento es muy caro, por favor, pngase en contacto con nuestra oficina llamando al [phone] y presione la opcin 4 o envenos un mensaje a travs de MyChart.   No podemos decirle cul ser su copago por los medicamentos por adelantado ya que esto es diferente dependiendo de la cobertura de su seguro. Sin embargo, es posible que podamos encontrar un medicamento sustituto a menor costo o llenar un formulario para que el  seguro cubra el medicamento que se considera necesario.   Si se requiere una autorizacin previa para que su compaa de seguros cubra su medicamento, por favor permtanos de 1 a 2 das hbiles para completar este proceso.  Los precios de los medicamentos varan con frecuencia dependiendo del lugar de dnde se surte la receta y alguna farmacias pueden ofrecer precios ms baratos.  El sitio web www.goodrx.com tiene cupones para medicamentos de diferentes farmacias. Los precios aqu no tienen en cuenta lo que podra costar con la ayuda del seguro (puede ser ms barato con su seguro), pero el sitio web puede darle el precio si no utiliz ningn seguro.  - Puede imprimir el cupn correspondiente y llevarlo con su receta a la farmacia.  - Tambin puede pasar por nuestra oficina durante el horario de atencin regular y recoger una tarjeta de cupones de GoodRx.  - Si necesita que su receta se enve electrnicamente a una farmacia diferente, informe a nuestra oficina a travs de MyChart de [facility]   o por telfono llamando al [phone] y presione la opcin 4.  

## 2022-07-02 NOTE — Progress Notes (Signed)
Follow-Up Visit   Subjective  Beverly Cole is a 62 y.o. female who presents for the following: Irritated skin lesions on the neck that get caught on jewelry, patient would like treated today. Toenail dystrophy, not improving with medications. Pt would like to discuss treatment options.   The following portions of the chart were reviewed this encounter and updated as appropriate: medications, allergies, medical history  Review of Systems:  No other skin or systemic complaints except as noted in HPI or Assessment and Plan.  Objective  Well appearing patient in no apparent distress; mood and affect are within normal limits.  A focused examination was performed of the following areas:   Relevant exam findings are noted in the Assessment and Plan.  R neck x 23, L neck x 5 (28) Erythematous stuck-on, waxy papule or plaque    Assessment & Plan   SEBORRHEIC KERATOSIS - Stuck-on, waxy, tan-brown papules and/or plaques  - Benign-appearing - Discussed benign etiology and prognosis. - Observe - Call for any changes  NAIL PROBLEM - trauma vs psoriasis vs lichen planus, QLABS negative for tinea  Exam: dystrophic nail Previous PCR showed no evidence of fungus.  Treatment Plan: Favor from trauma vs inflammatory condition. Recommend referral to podiatrist for further evaluation. Ok to use nail polish.   Inflamed seborrheic keratosis (28) R neck x 23, L neck x 5  Symptomatic, irritating, patient would like treated.   Destruction of lesion - R neck x 23, L neck x 5 Complexity: simple   Destruction method: cryotherapy   Informed consent: discussed and consent obtained   Timeout:  patient name, date of birth, surgical site, and procedure verified Lesion destroyed using liquid nitrogen: Yes   Region frozen until ice ball extended beyond lesion: Yes   Outcome: patient tolerated procedure well with no complications   Post-procedure details: wound care instructions given      ROSACEA Exam Mid face erythema with telangiectasias   Chronic condition with duration or expected duration over one year. Currently well-controlled.  Rosacea is a chronic progressive skin condition usually affecting the face of adults, causing redness and/or acne bumps. It is treatable but not curable. It sometimes affects the eyes (ocular rosacea) as well. It may respond to topical and/or systemic medication and can flare with stress, sun exposure, alcohol, exercise, topical steroids (including hydrocortisone/cortisone 10) and some foods.  Daily application of broad spectrum spf 30+ sunscreen to face is recommended to reduce flares.  Patient denies grittiness of the eyes  Treatment Plan Will prescribe [facility] metronidazole/ivermectin/azelaic acid twice daily as needed to affected areas on the face. The patient was advised this is not covered by insurance since it is made by a compounding pharmacy. They will receive an email to check out and the medication will be mailed to their home.   Continue tretinoin 0.025% cream pea sized amount nightly to entire affected area.  Topical retinoid medications like tretinoin/Retin-A, adapalene/Differin, tazarotene/Fabior, and Epiduo/Epiduo Forte can cause dryness and irritation when first started. Only apply a pea-sized amount to the entire affected area. Avoid applying it around the eyes, edges of mouth and creases at the nose. If you experience irritation, use a good moisturizer first and/or apply the medicine less often. If you are doing well with the medicine, you can increase how often you use it until you are applying every night. Be careful with sun protection while using this medication as it can make you sensitive to the sun. This medicine should not  be used by pregnant women.    Return if symptoms worsen or fail to improve.  Maylene Roes, CMA, am acting as scribe for Darden Dates, MD .  Documentation: I have reviewed the above  documentation for accuracy and completeness, and I agree with the above.  Darden Dates, MD

## 2022-07-03 ENCOUNTER — Other Ambulatory Visit (HOSPITAL_COMMUNITY): Payer: Self-pay

## 2022-07-03 ENCOUNTER — Ambulatory Visit (HOSPITAL_COMMUNITY): Payer: 59

## 2022-07-08 ENCOUNTER — Other Ambulatory Visit: Payer: Self-pay | Admitting: Family Medicine

## 2022-07-08 ENCOUNTER — Other Ambulatory Visit (HOSPITAL_COMMUNITY): Payer: Self-pay

## 2022-07-08 DIAGNOSIS — K219 Gastro-esophageal reflux disease without esophagitis: Secondary | ICD-10-CM

## 2022-07-09 ENCOUNTER — Other Ambulatory Visit: Payer: Self-pay

## 2022-07-09 ENCOUNTER — Other Ambulatory Visit (HOSPITAL_COMMUNITY): Payer: Self-pay

## 2022-07-09 MED ORDER — DEXLANSOPRAZOLE 60 MG PO CPDR
1.0000 | DELAYED_RELEASE_CAPSULE | Freq: Every day | ORAL | 1 refills | Status: DC
Start: 2022-07-09 — End: 2022-12-30
  Filled 2022-07-09: qty 90, 90d supply, fill #0
  Filled 2022-08-20 – 2022-10-05 (×2): qty 90, 90d supply, fill #1

## 2022-07-10 ENCOUNTER — Ambulatory Visit (HOSPITAL_COMMUNITY)
Admission: RE | Admit: 2022-07-10 | Discharge: 2022-07-10 | Disposition: A | Discharge status: Home / Self Care | Payer: 59 | Source: Ambulatory Visit | Attending: Family Medicine | Admitting: Family Medicine

## 2022-07-10 ENCOUNTER — Other Ambulatory Visit: Payer: Self-pay

## 2022-07-10 DIAGNOSIS — M5416 Radiculopathy, lumbar region: Secondary | ICD-10-CM | POA: Diagnosis not present

## 2022-07-10 DIAGNOSIS — M545 Low back pain, unspecified: Secondary | ICD-10-CM | POA: Insufficient documentation

## 2022-07-17 ENCOUNTER — Ambulatory Visit (INDEPENDENT_AMBULATORY_CARE_PROVIDER_SITE_OTHER): Payer: 59 | Admitting: Psychology

## 2022-07-17 DIAGNOSIS — F331 Major depressive disorder, recurrent, moderate: Secondary | ICD-10-CM

## 2022-07-17 NOTE — Progress Notes (Signed)
[facility] Counselor/Therapist Progress Note  Patient ID: Beverly Cole, MRN: [id number],    Date: 07/17/2022  Time Spent: 3:00pm - 3:55pm   55 minutes   Treatment Type: Individual Therapy  Reported Symptoms: sadness  Mental Status Exam: Appearance:  Casual     Behavior: Appropriate  Motor: Normal  Speech/Language:  Normal Rate  Affect: Appropriate  Mood: normal  Thought process: normal  Thought content:   WNL  Sensory/Perceptual disturbances:   WNL  Orientation: oriented to person, place, time/date, and situation  Attention: Good  Concentration: Good  Memory: WNL  Fund of knowledge:  Good  Insight:   Good  Judgment:  Good  Impulse Control: Good   Risk Assessment: Danger to Self:  No Self-injurious Behavior: No Danger to Others: No Duty to Warn:no Physical Aggression / Violence:No  Access to Firearms a concern: No  Gang Involvement:No   Subjective: Pt present for face-to-face individual therapy via video.  Pt consents to telehealth video session due to COVID 19 pandemic. Location of pt: home Location of therapist: home office.   Pt talked about her trip to [place].  Pt and [name] had a great trip.   This was very therapeutic for pt.  Pt had a family trip to [place].  During that trip she shared about her life the past 15 years.   Pt was glad that she shared what is real for her. Pt talked about her cousin dying recently.   Pt talked about feeling like she has wasted a lot of time the past 15 years bc of her mental health issues.   She is feeling better now and wants to work on what she can do to improve her life now.  Worked on helping pt identify what she would like to do going forward.  Worked on self care strategies.  Provided supportive therapy.    Interventions: Cognitive Behavioral Therapy and Insight-Oriented  Diagnosis: F33.1  Plan of Care: Recommend ongoing therapy.   Pt participated in setting treatment goals.  Plan to meet monthly.     Treatment Plan Client Abilities/Strengths  Pt is bright, engaging, and motivated for therapy.   Client Treatment Preferences  Individual therapy.  Client Statement of Needs  Improve coping skills.  Symptoms  Depressed or irritable mood. Diminished interest in or enjoyment of activities. Lack of energy. Poor concentration and indecisiveness. Social withdrawal.  Problems Addressed  Unipolar Depression Goals 1. Alleviate depressive symptoms and return to previous level of effective functioning. 2. Appropriately grieve the loss in order to normalize mood and to return to previously adaptive level of functioning. Objective Learn and implement behavioral strategies to overcome depression. Target Date: 2022-10-30 Frequency: Biweekly  Progress: 50 Modality: individual  Related Interventions Engage the client in "behavioral activation," increasing his/her activity level and contact with sources of reward, while identifying processes that inhibit activation.  Use behavioral techniques such as instruction, rehearsal, role-playing, role reversal, as needed, to facilitate activity in the client's daily life; reinforce success. Assist the client in developing skills that increase the likelihood of deriving pleasure from behavioral activation (e.g., assertiveness skills, developing an exercise plan, less internal/more external focus, increased social involvement); reinforce success. Objective Identify important people in life, past and present, and describe the quality, good and poor, of those relationships. Target Date: 2022-10-30 Frequency: Biweekly  Progress: 50 Modality: individual  Related Interventions Conduct Interpersonal Therapy beginning with the assessment of the client's "interpersonal inventory" of important past and present relationships; develop a case formulation  linking depression to grief, interpersonal role disputes, role transitions, and/or interpersonal  deficits). Objective Learn and implement problem-solving and decision-making skills. Target Date: 2022-10-30 Frequency: Biweekly  Progress: 50 Modality: individual  Related Interventions Conduct Problem-Solving Therapy using techniques such as psychoeducation, modeling, and role-playing to teach client problem-solving skills (i.e., defining a problem specifically, generating possible solutions, evaluating the pros and cons of each solution, selecting and implementing a plan of action, evaluating the efficacy of the plan, accepting or revising the plan); role-play application of the problem-solving skill to a real life issue. Encourage in the client the development of a positive problem orientation in which problems and solving them are viewed as a natural part of life and not something to be feared, despaired, or avoided. 3. Develop healthy interpersonal relationships that lead to the alleviation and help prevent the relapse of depression. 4. Develop healthy thinking patterns and beliefs about self, others, and the world that lead to the alleviation and help prevent the relapse of depression. 5. Recognize, accept, and cope with feelings of depression. Diagnosis F33.1  Conditions For Discharge Achievement of treatment goals and objectives   Salomon Fick, LCSW

## 2022-07-17 NOTE — Progress Notes (Signed)
62 y.o. G0P0 Married White or Caucasian Not Hispanic or Latino female here for annual exam.  No vaginal bleeding. She is infrequently sexually active, when she is they use a lubricant, it is painful and she feels like she tears. Will bleed after sex (only time).    H/O PE on HRT, has anticardiolipin antibodies and protein S Deficiency.   She has issues with diarrhea/constipation. Takes a probiotic and a prebiotic which helps.  No bladder c/o.   She has a long h/o severe depression, she started a new medication last November and is doing so much better!  Patient's last menstrual period was 05/11/2012.          Sexually active: Yes.    The current method of family planning is post menopausal status.    Exercising: No.  The patient does not participate in regular exercise at present. Smoker:  no  Health Maintenance: Pap: 07/17/21 WNL Hr HPV Neg; 01/20/19 WNL; 07/02/2017 WNL, 06-17-16 Neg, 06-05-15 Neg:Neg HR HPV  History of abnormal Pap:  yes HX Cryo over 30 years ago.  MMG:  01/17/22 Density C Bi-rads 2 benign  BMD:  08/04/19 T score -1.8 osteopenic with primary. FRAX 8.9/1.0% Colonoscopy: 10/19/15 Normal f/u 10 years  TDaP:  07/17/21 Gardasil: n/a   reports that she quit smoking about 26 years ago. Her smoking use included cigarettes. She has a 25.00 pack-year smoking history. She has never used smokeless tobacco. She reports current alcohol use. She reports that she does not use drugs.  Past Medical History:  Diagnosis Date   Anxiety    Atrial fibrillation (HCC)    a. Event monitor 2013 - PACs/bradycardia/SVT/short run of atrial fib by event monitor.    BIPOLAR DISORDER UNSPECIFIED    Bradycardia    Bursitis of hip 09/1999   Bilateral - Dr. Luiz Blare   DEPRESSION    Emphysema lung (HCC) 06/20/2016   pt states pulmonalongist stated started recently   GERD    Headache(784.0)    was with menstrual cycle. no longer a problem   HYPERLIPIDEMIA    Hypertension    Hypertension    IRRITABLE  BOWEL SYNDROME, HX OF    Menopausal state 01/2012   [facility] = 88.5   Mitral valve prolapse 12/17/2000   a. dx 1980s, most recent echo did not demonstrate this.   MYALGIA    Normal coronary arteries    a. by cardiac CT 2013.   PAT (paroxysmal atrial tachycardia)    PE (pulmonary embolism) 02/09/2015   a. Bilateral PEs 01/2015 when d-dimer 0.69, CP lying on left side. Followed by heme-onc - + lupus anticoagulant, mildly depressed protein S. Dr. Myna Hidalgo is not certain if her hypercoagulable studies are significant for a thrombophilic state.   Premature atrial contractions    Pulmonary embolus (HCC) 02/10/2015   Shortness of breath    Pulmonary eval 05/2002   Thyroid nodule 2014   Tremors of nervous system     Past Surgical History:  Procedure Laterality Date   COLONOSCOPY     LAPAROSCOPIC APPENDECTOMY N/A 03/17/2017   Procedure: APPENDECTOMY LAPAROSCOPIC;  Surgeon: Glenna Fellows, MD;  Location: [facility];  Service: General;  Laterality: N/A;   Lipoma (R) side  1990   Right back    Current Outpatient Medications  Medication Sig Dispense Refill   ALPRAZolam (XANAX) 0.5 MG tablet TAKE 1 TABLET (0.5 MG TOTAL) BY MOUTH 2 (TWO) TIMES DAILY AS NEEDED FOR ANXIETY. TAKE 1 TABLET 30 MINUTES PRIOR TO PROCEDURE 30  tablet 0   aspirin EC 325 MG tablet Take 325 mg by mouth daily.     clobetasol ointment (TEMOVATE) 0.05 % Apply topically to nail, proximal nail fold daily on weekends only. Avoid applying to face, groin, and axilla. Use as directed. 30 g 3   dexlansoprazole (DEXILANT) 60 MG capsule Take 1 capsule (60 mg total) by mouth daily. 90 capsule 1   Dextromethorphan-buPROPion ER (AUVELITY) 45-105 MG TBCR Take 1 tablet by mouth 2 (two) times daily. 180 tablet 0   diltiazem (CARTIA XT) 180 MG 24 hr capsule Take 1 capsule (180 mg total) by mouth daily. 90 capsule 1   fexofenadine (ALLEGRA ALLERGY) 180 MG tablet Take 1 tablet (180 mg total) by mouth daily.     fluticasone (FLONASE) 50 MCG/ACT nasal  spray Place 2 sprays into the nose daily as needed. For allergies     gabapentin (NEURONTIN) 600 MG tablet Take 2 tablets (1,200 mg total) by mouth every evening. 180 tablet 0   lidocaine (XYLOCAINE) 5 % ointment Apply a pea sized amount topically 20 minutes prior to intercourse, wipe off just prior to intercourse. 30 g 1   modafinil (PROVIGIL) 200 MG tablet Take 1.5 tablets (300 mg total) by mouth in the morning. 135 tablet 0   NONFORMULARY OR COMPOUNDED ITEM sm Azelaic acid/ metronidazole/ivermectin 15%/1%/1% cream apply to face bid 1 each 5   OLANZapine (ZYPREXA) 5 MG tablet Take 1 tablet (5 mg total) by mouth at bedtime. 90 tablet 0   Probiotic Product (PROBIOTIC DAILY PO) Take 1 tablet by mouth daily.      propranolol (INDERAL) 20 MG tablet Take 1 tablet (20 mg total) by mouth 2 (two) times daily. 180 tablet 0   sertraline (ZOLOFT) 50 MG tablet Take 1.5 tablets (75 mg total) by mouth daily. 135 tablet 0   tretinoin (RETIN-A) 0.025 % cream Apply topically at bedtime. 45 g 11   trimethoprim (TRIMPEX) 100 MG tablet take as directed after intercourse 30 tablet 2   No current facility-administered medications for this visit.    Family History  Problem Relation Age of Onset   Lung cancer Mother    Hypertension Mother    Hyperlipidemia Mother    Cancer Father        Esophageal cancer   Hyperlipidemia Father    Depression Father    Cancer Brother    Pulmonary embolism Brother    Colon cancer Paternal Uncle    Sarcoidosis Other    Testicular cancer Other     Review of Systems  All other systems reviewed and are negative.   Exam:   LMP 05/11/2012   Weight change: @WEIGHTCHANGE @ Height:      Ht Readings from Last 3 Encounters:  06/20/22 5\' 9"  (1.753 m)  04/29/22 5\' 9"  (1.753 m)  07/17/21 5\' 9"  (1.753 m)    General appearance: alert, cooperative and appears stated age Head: Normocephalic, without obvious abnormality, atraumatic Neck: no adenopathy, supple, symmetrical, trachea  midline and thyroid normal to inspection and palpation Lungs: clear to auscultation bilaterally Cardiovascular: regular rate and rhythm Breasts: normal appearance, no masses or tenderness Abdomen: soft, non-tender; non distended,  no masses,  no organomegaly Extremities: extremities normal, atraumatic, no cyanosis or edema Skin: Skin color, texture, turgor normal. No rashes or lesions Lymph nodes: Cervical, supraclavicular, and axillary nodes normal. No abnormal inguinal nodes palpated Neurologic: Grossly normal   Pelvic: External genitalia:  no lesions  Urethra:  normal appearing urethra with no masses, tenderness or lesions              Bartholins and Skenes: normal                 Vagina: atrophic appearing vagina with normal color and discharge, no lesions              Cervix: no lesions               Bimanual Exam:  Uterus:  normal size, contour, position, consistency, mobility, non-tender              Adnexa: no mass, fullness, tenderness               Rectovaginal: Confirms               Anus:  normal sphincter tone, no lesions  Carolynn Serve, CMA chaperoned for the exam.  1. Well woman exam Discussed breast self exam Discussed calcium and vit D intake Mammogram, colonoscopy and pap are utd  2. Introital dyspareunia Uses lubricant, tolerable  3. Osteopenia, unspecified location - DG Bone Density; Future  4. Hypoestrogenism - DG Bone Density; Future

## 2022-07-24 ENCOUNTER — Encounter: Payer: Self-pay | Admitting: Obstetrics and Gynecology

## 2022-07-24 ENCOUNTER — Ambulatory Visit: Payer: 59 | Admitting: Obstetrics and Gynecology

## 2022-07-24 VITALS — BP 116/74 | HR 78 | Ht 69.0 in | Wt 174.0 lb

## 2022-07-24 DIAGNOSIS — Z01419 Encounter for gynecological examination (general) (routine) without abnormal findings: Secondary | ICD-10-CM | POA: Diagnosis not present

## 2022-07-24 DIAGNOSIS — M8580 Other specified disorders of bone density and structure, unspecified site: Secondary | ICD-10-CM

## 2022-07-24 DIAGNOSIS — E2839 Other primary ovarian failure: Secondary | ICD-10-CM | POA: Diagnosis not present

## 2022-07-24 DIAGNOSIS — N9411 Superficial (introital) dyspareunia: Secondary | ICD-10-CM

## 2022-07-24 NOTE — Patient Instructions (Signed)

## 2022-07-25 ENCOUNTER — Ambulatory Visit: Payer: Self-pay | Admitting: Obstetrics and Gynecology

## 2022-08-01 ENCOUNTER — Other Ambulatory Visit: Payer: Self-pay

## 2022-08-01 DIAGNOSIS — M545 Low back pain, unspecified: Secondary | ICD-10-CM

## 2022-08-19 ENCOUNTER — Ambulatory Visit (INDEPENDENT_AMBULATORY_CARE_PROVIDER_SITE_OTHER): Payer: 59 | Admitting: Psychology

## 2022-08-19 DIAGNOSIS — F331 Major depressive disorder, recurrent, moderate: Secondary | ICD-10-CM | POA: Diagnosis not present

## 2022-08-19 NOTE — Progress Notes (Signed)
[facility] Counselor/Therapist Progress Note  Patient ID: Beverly Cole, MRN: [id number],    Date: 08/19/2022  Time Spent: 3:00pm - 3:50pm   50 minutes   Treatment Type: Individual Therapy  Reported Symptoms: stress  Mental Status Exam: Appearance:  Casual     Behavior: Appropriate  Motor: Normal  Speech/Language:  Normal Rate  Affect: Appropriate  Mood: normal  Thought process: normal  Thought content:   WNL  Sensory/Perceptual disturbances:   WNL  Orientation: oriented to person, place, time/date, and situation  Attention: Good  Concentration: Good  Memory: WNL  Fund of knowledge:  Good  Insight:   Good  Judgment:  Good  Impulse Control: Good   Risk Assessment: Danger to Self:  No Self-injurious Behavior: No Danger to Others: No Duty to Warn:no Physical Aggression / Violence:No  Access to Firearms a concern: No  Gang Involvement:No   Subjective: Pt present for face-to-face individual therapy via video.  Pt consents to telehealth video session and is aware of limitations of virtual sessions. Location of pt: home Location of therapist: home office.   Pt talked about her cats.  She is very attached to them.   She still misses cats she had who passed away.   Pt talked about the issues in our country.   She is worried about the presidential elections and feels like there is not good candidates.  Addressed pt's worries.  Worked on coping strategies.  Pt states her mood continues to be good and she is engaging in activities.  She wants to make more friends and have more connections.  Worked on self care strategies.  Provided supportive therapy.    Interventions: Cognitive Behavioral Therapy and Insight-Oriented  Diagnosis: F33.1  Plan of Care: Recommend ongoing therapy.   Pt participated in setting treatment goals.  Plan to meet monthly.    Treatment Plan Client Abilities/Strengths  Pt is bright, engaging, and motivated for therapy.   Client  Treatment Preferences  Individual therapy.  Client Statement of Needs  Improve coping skills.  Symptoms  Depressed or irritable mood. Diminished interest in or enjoyment of activities. Lack of energy. Poor concentration and indecisiveness. Social withdrawal.  Problems Addressed  Unipolar Depression Goals 1. Alleviate depressive symptoms and return to previous level of effective functioning. 2. Appropriately grieve the loss in order to normalize mood and to return to previously adaptive level of functioning. Objective Learn and implement behavioral strategies to overcome depression. Target Date: 2022-10-30 Frequency: Biweekly  Progress: 50 Modality: individual  Related Interventions Engage the client in "behavioral activation," increasing his/her activity level and contact with sources of reward, while identifying processes that inhibit activation.  Use behavioral techniques such as instruction, rehearsal, role-playing, role reversal, as needed, to facilitate activity in the client's daily life; reinforce success. Assist the client in developing skills that increase the likelihood of deriving pleasure from behavioral activation (e.g., assertiveness skills, developing an exercise plan, less internal/more external focus, increased social involvement); reinforce success. Objective Identify important people in life, past and present, and describe the quality, good and poor, of those relationships. Target Date: 2022-10-30 Frequency: Biweekly  Progress: 50 Modality: individual  Related Interventions Conduct Interpersonal Therapy beginning with the assessment of the client's "interpersonal inventory" of important past and present relationships; develop a case formulation linking depression to grief, interpersonal role disputes, role transitions, and/or interpersonal deficits). Objective Learn and implement problem-solving and decision-making skills. Target Date: 2022-10-30 Frequency: Biweekly   Progress: 50 Modality: individual  Related Interventions Conduct Problem-Solving  Therapy using techniques such as psychoeducation, modeling, and role-playing to teach client problem-solving skills (i.e., defining a problem specifically, generating possible solutions, evaluating the pros and cons of each solution, selecting and implementing a plan of action, evaluating the efficacy of the plan, accepting or revising the plan); role-play application of the problem-solving skill to a real life issue. Encourage in the client the development of a positive problem orientation in which problems and solving them are viewed as a natural part of life and not something to be feared, despaired, or avoided. 3. Develop healthy interpersonal relationships that lead to the alleviation and help prevent the relapse of depression. 4. Develop healthy thinking patterns and beliefs about self, others, and the world that lead to the alleviation and help prevent the relapse of depression. 5. Recognize, accept, and cope with feelings of depression. Diagnosis F33.1  Conditions For Discharge Achievement of treatment goals and objectives   Salomon Fick, LCSW

## 2022-08-20 ENCOUNTER — Other Ambulatory Visit: Payer: Self-pay

## 2022-08-20 ENCOUNTER — Encounter: Payer: Self-pay | Admitting: Family Medicine

## 2022-08-20 ENCOUNTER — Other Ambulatory Visit (HOSPITAL_COMMUNITY): Payer: Self-pay

## 2022-08-25 ENCOUNTER — Other Ambulatory Visit: Payer: Self-pay | Admitting: Psychiatry

## 2022-08-26 ENCOUNTER — Other Ambulatory Visit: Payer: Self-pay

## 2022-08-27 NOTE — Telephone Encounter (Signed)
Due 7/20

## 2022-08-28 DIAGNOSIS — Z1211 Encounter for screening for malignant neoplasm of colon: Secondary | ICD-10-CM | POA: Diagnosis not present

## 2022-08-28 DIAGNOSIS — Z800 Family history of malignant neoplasm of digestive organs: Secondary | ICD-10-CM | POA: Diagnosis not present

## 2022-08-28 DIAGNOSIS — K5730 Diverticulosis of large intestine without perforation or abscess without bleeding: Secondary | ICD-10-CM | POA: Diagnosis not present

## 2022-08-28 DIAGNOSIS — K219 Gastro-esophageal reflux disease without esophagitis: Secondary | ICD-10-CM | POA: Diagnosis not present

## 2022-08-28 MED ORDER — GABAPENTIN 600 MG PO TABS
1200.0000 mg | ORAL_TABLET | Freq: Every evening | ORAL | 0 refills | Status: DC
  Filled 2022-08-28: qty 180, 90d supply, fill #0

## 2022-08-29 ENCOUNTER — Other Ambulatory Visit: Payer: Self-pay

## 2022-08-29 ENCOUNTER — Other Ambulatory Visit (HOSPITAL_COMMUNITY): Payer: Self-pay

## 2022-09-03 ENCOUNTER — Other Ambulatory Visit (HOSPITAL_COMMUNITY): Payer: Self-pay

## 2022-09-04 ENCOUNTER — Other Ambulatory Visit: Payer: Self-pay

## 2022-09-11 ENCOUNTER — Other Ambulatory Visit: Payer: Self-pay | Admitting: Psychiatry

## 2022-09-11 ENCOUNTER — Other Ambulatory Visit (HOSPITAL_COMMUNITY): Payer: Self-pay

## 2022-09-11 DIAGNOSIS — F331 Major depressive disorder, recurrent, moderate: Secondary | ICD-10-CM

## 2022-09-11 MED ORDER — AUVELITY 45-105 MG PO TBCR
1.0000 | EXTENDED_RELEASE_TABLET | Freq: Two times a day (BID) | ORAL | 0 refills | Status: AC
Start: 2022-09-11 — End: ?
  Filled 2022-09-11: qty 180, 90d supply, fill #0

## 2022-09-12 ENCOUNTER — Other Ambulatory Visit (HOSPITAL_COMMUNITY): Payer: Self-pay

## 2022-09-24 ENCOUNTER — Other Ambulatory Visit: Payer: Self-pay

## 2022-09-24 ENCOUNTER — Other Ambulatory Visit (HOSPITAL_COMMUNITY): Payer: Self-pay

## 2022-09-24 MED ORDER — GOLYTELY 236 G PO SOLR
ORAL | 0 refills | Status: DC
  Filled 2022-09-24: qty 4000, 1d supply, fill #0

## 2022-09-25 ENCOUNTER — Telehealth: Payer: Self-pay | Admitting: *Deleted

## 2022-09-25 NOTE — Telephone Encounter (Signed)
Left message to call back to schedule an in office appt for pre op clearance.

## 2022-09-25 NOTE — Telephone Encounter (Signed)
   Pre-operative Risk Assessment    Patient Name: Beverly Cole  DOB: Jul 26, 1960 MRN: [id number]      Request for Surgical Clearance    Procedure:   COLONOSCOPY  Date of Surgery:  Clearance 10/08/22                                 Surgeon:  DR. MANN Surgeon's Group or Practice Name:  [facility] Phone number:  [phone] Fax number:  [phone]   Type of Clearance Requested:   - Medical ; NO MEDICATIONS LISTED TO BE HELD   Type of Anesthesia:   PROPOFOL   Additional requests/questions:    Elpidio Anis   09/25/2022, 2:25 PM

## 2022-09-25 NOTE — Telephone Encounter (Signed)
    Primary Cardiologist:Christopher Clifton James, MD  Chart reviewed as part of pre-operative protocol coverage. Because of Kialee Schendel Navarez's past medical history and time since last visit, he/she will require a follow-up visit in order to better assess preoperative cardiovascular risk.  Pre-op covering staff: - Please schedule Office appointment and call patient to inform them. - Please contact requesting surgeon's office via preferred method (i.e, phone, fax) to inform them of need for appointment prior to surgery.  If applicable, this message will also be routed to pharmacy pool and/or primary cardiologist for input on holding anticoagulant/antiplatelet agent as requested below so that this information is available at time of patient's appointment.   Ronney Asters, NP  09/25/2022, 2:31 PM

## 2022-09-29 NOTE — Telephone Encounter (Signed)
Pt has an in office visit scheduled for 08/26 with Edd Fabian, NP.

## 2022-10-01 ENCOUNTER — Ambulatory Visit: Payer: Medicare Other | Admitting: Psychiatry

## 2022-10-03 NOTE — Progress Notes (Unsigned)
Cardiology Clinic Note   Patient Name: Beverly Cole Date of Encounter: 10/06/2022  Primary Care Provider:  Zola Button, Grayling Congress, DO Primary Cardiologist:  Verne Carrow, MD  Patient Profile    Beverly Cole 62 year old female presents to the clinic today for follow-up evaluation of her hypertension, preoperative cardiac evaluation, and SVT.  Past Medical History    Past Medical History:  Diagnosis Date   Anxiety    Atrial fibrillation (HCC)    a. Event monitor 2013 - PACs/bradycardia/SVT/short run of atrial fib by event monitor.    BIPOLAR DISORDER UNSPECIFIED    Bradycardia    Bursitis of hip 09/1999   Bilateral - Dr. Luiz Blare   DEPRESSION    Emphysema lung (HCC) 06/20/2016   pt states pulmonalongist stated started recently   GERD    Headache(784.0)    was with menstrual cycle. no longer a problem   HYPERLIPIDEMIA    Hypertension    Hypertension    IRRITABLE BOWEL SYNDROME, HX OF    Menopausal state 01/2012   [facility] = 88.5   Mitral valve prolapse 12/17/2000   a. dx 1980s, most recent echo did not demonstrate this.   MYALGIA    Normal coronary arteries    a. by cardiac CT 2013.   PAT (paroxysmal atrial tachycardia)    PE (pulmonary embolism) 02/09/2015   a. Bilateral PEs 01/2015 when d-dimer 0.69, CP lying on left side. Followed by heme-onc - + lupus anticoagulant, mildly depressed protein S. Dr. Myna Hidalgo is not certain if her hypercoagulable studies are significant for a thrombophilic state.   Premature atrial contractions    Pulmonary embolus (HCC) 02/10/2015   Shortness of breath    Pulmonary eval 05/2002   Thyroid nodule 2014   Tremors of nervous system    Past Surgical History:  Procedure Laterality Date   COLONOSCOPY     LAPAROSCOPIC APPENDECTOMY N/A 03/17/2017   Procedure: APPENDECTOMY LAPAROSCOPIC;  Surgeon: Glenna Fellows, MD;  Location: [facility];  Service: General;  Laterality: N/A;   Lipoma (R) side  1990   Right back     Allergies  Allergies  Allergen Reactions   Cariprazine Other (See Comments)    Patient becomes suicidal when taking this medication.  Patient becomes suicidal when taking this medication.    Metoprolol Other (See Comments)    Hallucinations hair falls out Hallucinations hair falls out   Nickel Rash   Iron Nausea And Vomiting    History of Present Illness    Beverly Cole has a PMH of hyperlipidemia, hypertension, bipolar disorder, IBS, mitral valve prolapse, bilateral PE 12/16, and SVT.  She was diagnosed with mitral valve prolapse in the 1980s.  She had coronary CTA for evaluation of chest pain 7/13 which showed normal coronary arteries.  She reported irregular/skipped heartbeats several times per week.  Echocardiogram 2013 showed normal LV function, mild MR.  She wore a cardiac event monitor for 48 hours which showed normal sinus rhythm, sinus bradycardia, PACs and runs of SVT.  A short run of possible atrial fibrillation was also noted.  She was seen by EP 10/23.  It was felt that she most likely had a long RP tachycardia.  No changes in medical therapy were recommended.  She was seen by EP 1/14.  She reported she felt that her metoprolol may be causing hallucinations so she was started on Cardizem and her metoprolol was discontinued.  She was noted to have bilateral PE 12/16.  She was  started on Xarelto.  She followed up with Lucile Crater, PA-C on 7/17.  She had had been seen in the emergency department for chest pain.  Her troponins were negative.  She reportedly had PVCs on the monitor in the ED.  Her chest CTA showed no evidence of recurrent PE.  Nuclear stress test 8/17 showed no evidence of ischemia EF 69%.  Echocardiogram 12/17 showed an EF of 50-55%, G1 DD and no valvular abnormalities.  She was seen in follow-up by Dr. Clifton James 02/07/2021.  During that time she remained stable from a cardiac standpoint.  She denied chest pain and shortness of breath.  She presents to the clinic  today for follow-up evaluation and states she continues to do well from a cardiac standpoint.  Her EKG today shows normal sinus rhythm.  Her blood pressure is 102/74.  In the last 2 months she has noticed 2 episodes of palpitations that were brief.  We reviewed her prior echocardiogram and she expressed understanding.  Will plan follow-up in 12 months..  Today he denies chest pain, shortness of breath, lower extremity edema, fatigue, palpitations, melena, hematuria, hemoptysis, diaphoresis, weakness, presyncope, syncope, orthopnea, and PND.     Home Medications    Prior to Admission medications   Medication Sig Start Date End Date Taking? Authorizing Provider  ALPRAZolam (XANAX) 0.5 MG tablet TAKE 1 TABLET (0.5 MG TOTAL) BY MOUTH 2 (TWO) TIMES DAILY AS NEEDED FOR ANXIETY. TAKE 1 TABLET 30 MINUTES PRIOR TO PROCEDURE 06/24/22   Cottle, Steva Ready., MD  aspirin EC 325 MG tablet Take 325 mg by mouth daily.    [provider]  clobetasol ointment (TEMOVATE) 0.05 % Apply topically to nail, proximal nail fold daily on weekends only. Avoid applying to face, groin, and axilla. Use as directed. 01/20/22   Moye, [place], MD  dexlansoprazole (DEXILANT) 60 MG capsule Take 1 capsule (60 mg total) by mouth daily. 07/09/22   Donato Schultz, DO  Dextromethorphan-buPROPion ER (AUVELITY) 45-105 MG TBCR Take 1 tablet by mouth 2 (two) times daily. 09/11/22   Cottle, Steva Ready., MD  diltiazem (CARTIA XT) 180 MG 24 hr capsule Take 1 capsule (180 mg total) by mouth daily. 06/09/22   Donato Schultz, DO  fexofenadine (ALLEGRA ALLERGY) 180 MG tablet Take 1 tablet (180 mg total) by mouth daily. 04/24/20   Seabron Spates R, DO  fluticasone (FLONASE) 50 MCG/ACT nasal spray Place 2 sprays into the nose daily as needed. For allergies    [provider]  gabapentin (NEURONTIN) 600 MG tablet Take 2 tablets (1,200 mg total) by mouth every evening. 08/28/22   Mozingo, Thereasa Solo, NP  lidocaine  (XYLOCAINE) 5 % ointment Apply a pea sized amount topically 20 minutes prior to intercourse, wipe off just prior to intercourse. 07/17/21   Romualdo Bolk, MD  modafinil (PROVIGIL) 200 MG tablet Take 1.5 tablets (300 mg total) by mouth in the morning. 06/24/22   Cottle, Steva Ready., MD  NONFORMULARY OR COMPOUNDED ITEM sm Azelaic acid/ metronidazole/ivermectin 15%/1%/1% cream apply to face bid 05/03/20   Zola Button, Myrene Buddy R, DO  OLANZapine (ZYPREXA) 5 MG tablet Take 1 tablet (5 mg total) by mouth at bedtime. 06/24/22   Cottle, Steva Ready., MD  polyethylene glycol (GOLYTELY) 236 g solution Use as directed 09/24/22     Probiotic Product (PROBIOTIC DAILY PO) Take 1 tablet by mouth daily.     [provider]  propranolol (INDERAL) 20 MG tablet  Take 1 tablet (20 mg total) by mouth 2 (two) times daily. 06/24/22   Cottle, Steva Ready., MD  sertraline (ZOLOFT) 50 MG tablet Take 1.5 tablets (75 mg total) by mouth daily. 06/24/22   Cottle, Steva Ready., MD  tretinoin (RETIN-A) 0.025 % cream Apply topically at bedtime. 07/02/22   Moye, [place], MD  trimethoprim (TRIMPEX) 100 MG tablet take as directed after intercourse 04/10/22   Zola Button, Grayling Congress, DO    Family History    Family History  Problem Relation Age of Onset   Lung cancer Mother    Hypertension Mother    Hyperlipidemia Mother    Cancer Father        Esophageal cancer   Hyperlipidemia Father    Depression Father    Cancer Brother    Pulmonary embolism Brother    Colon cancer Paternal Uncle    Sarcoidosis Other    Testicular cancer Other    She indicated that her mother is deceased. She indicated that her father is deceased. She indicated that her brother is alive. She indicated that the status of her paternal uncle is unknown. She indicated that the status of her other is unknown.  Social History    Social History   Socioeconomic History   Marital status: Married    Spouse name: Not on file   Number of children: 0   Years  of education: Not on file   Highest education level: Associate degree: academic program  Occupational History   Occupation: Nurse-Personal Care/HH    Employer: UNEMPLOYED  Tobacco Use   Smoking status: Former    Current packs/day: 0.00    Average packs/day: 1 pack/day for 25.0 years (25.0 ttl pk-yrs)    Types: Cigarettes    Start date: 02/11/1971    Quit date: 02/11/1996    Years since quitting: 26.6   Smokeless tobacco: Never   Tobacco comments:    Married, lives with spouse. 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       No exercise   Social Determinants of Health   Financial Resource Strain: Low Risk  (06/20/2022)   Overall Financial Resource Strain (CARDIA)    Difficulty of Paying Living Expenses: Not hard at all  Food Insecurity: No Food Insecurity (06/20/2022)   Hunger Vital Sign    Worried About Running Out of Food in the Last Year: Never true    Ran Out of Food in the Last Year: Never true  Transportation Needs: No Transportation Needs (06/20/2022)   PRAPARE - Administrator, Civil Service (Medical): No    Lack of Transportation (Non-Medical): No  Physical Activity: Unknown (06/20/2022)   Exercise Vital Sign    Days of Exercise per Week: 0 days    Minutes of Exercise per Session: Not on file  Stress: Stress Concern Present (06/20/2022)   [employer] of Occupational Health - Occupational Stress Questionnaire    Feeling of Stress : Very much  Social Connections: Socially Isolated (06/20/2022)   Social Connection and Isolation Panel [NHANES]    Frequency of Communication with Friends and Family:  Never    Frequency of Social Gatherings with  Friends and Family: Never    Attends Religious Services: Never    Database administrator or Organizations: No    Attends Engineer, structural: Not on file    Marital Status: Married  Catering manager Violence: Not on file     Review of Systems    General:  No chills, fever, night sweats or weight changes.  Cardiovascular:  No chest pain, dyspnea on exertion, edema, orthopnea, palpitations, paroxysmal nocturnal dyspnea. Dermatological: No rash, lesions/masses Respiratory: No cough, dyspnea Urologic: No hematuria, dysuria Abdominal:   No nausea, vomiting, diarrhea, bright red blood per rectum, melena, or hematemesis Neurologic:  No visual changes, wkns, changes in mental status. All other systems reviewed and are otherwise negative except as noted above.  Physical Exam    VS:  BP 102/74 (BP Location: Right Arm, Patient Position: Sitting, Cuff Size: Normal)   Pulse 60   Ht 5\' 9"  (1.753 m)   Wt 175 lb (79.4 kg)   LMP 05/11/2012   SpO2 98%   BMI 25.84 kg/m  , BMI Body mass index is 25.84 kg/m. GEN: Well nourished, well developed, in no acute distress. HEENT: normal. Neck: Supple, no JVD, carotid bruits, or masses. Cardiac: RRR, no murmurs, rubs, or gallops. No clubbing, cyanosis, edema.  Radials/DP/PT 2+ and equal bilaterally.  Respiratory:  Respirations regular and unlabored, clear to auscultation bilaterally. GI: Soft, nontender, nondistended, BS + x 4. MS: no deformity or atrophy. Skin: warm and dry, no rash. Neuro:  Strength and sensation are intact. Psych: Normal affect.  Accessory Clinical Findings    Recent Labs: 04/29/2022: ALT 17; BUN 14; Creatinine, Ser 0.84; Hemoglobin 12.3; Platelets 380.0; Potassium 4.2; Sodium 142; TSH 5.42   Recent Lipid Panel    Component Value Date/Time   CHOL 282 (H) 04/29/2022 1341   TRIG 312.0 (H) 04/29/2022 1341   HDL 42.50 04/29/2022 1341   CHOLHDL 7 04/29/2022 1341   VLDL 62.4 (H) 04/29/2022 1341   LDLCALC 154 (H)  11/26/2017 1451   LDLDIRECT 200.0 04/29/2022 1341         ECG personally reviewed by me today- EKG Interpretation Date/Time:  Monday October 06 2022 14:04:06 EDT Ventricular Rate:  60 PR Interval:  162 QRS Duration:  84 QT Interval:  436 QTC Calculation: 436 R Axis:   10  Text Interpretation: Normal sinus rhythm Normal ECG Confirmed by Edd Fabian [phone]) on 10/06/2022 2:56:16 PM    Echocardiogram 02/26/2021  IMPRESSIONS     1. Left ventricular ejection fraction, by estimation, is 60 to 65%. Left  ventricular ejection fraction by 3D volume is 66 %. The left ventricle has  normal function. The left ventricle has no regional wall motion  abnormalities. Left ventricular diastolic   parameters are consistent with Grade I diastolic dysfunction (impaired  relaxation). The average left ventricular global longitudinal strain is  -18.8 %. The global longitudinal strain is normal.   2. Right ventricular systolic function is normal. The right ventricular  size is normal. There is normal pulmonary artery systolic pressure. The  estimated right ventricular systolic pressure is 26.0 mmHg.   3. The mitral valve is normal in structure. Mild mitral valve  regurgitation. No evidence of mitral stenosis. There is mild late systolic  prolapse of both leaflets. There are borderline criteria for prolapse of  the mitral valve.   4. The aortic valve is tricuspid. Aortic valve regurgitation is not  visualized. No aortic stenosis  is present.   5. The inferior vena cava is normal in size with greater than 50%  respiratory variability, suggesting right atrial pressure of 3 mmHg.   Comparison(s): Prior images unable to be directly viewed, comparison made  by report only.   FINDINGS   Left Ventricle: Left ventricular ejection fraction, by estimation, is 60  to 65%. Left ventricular ejection fraction by 3D volume is 66 %. The left  ventricle has normal function. The left ventricle has no regional  wall  motion abnormalities. The average  left ventricular global longitudinal strain is -18.8 %. The global  longitudinal strain is normal. The left ventricular internal cavity size  was normal in size. There is no left ventricular hypertrophy. Left  ventricular diastolic parameters are consistent   with Grade I diastolic dysfunction (impaired relaxation). Normal left  ventricular filling pressure.   Right Ventricle: The right ventricular size is normal. No increase in  right ventricular wall thickness. Right ventricular systolic function is  normal. There is normal pulmonary artery systolic pressure. The tricuspid  regurgitant velocity is 2.40 m/s, and   with an assumed right atrial pressure of 3 mmHg, the estimated right  ventricular systolic pressure is 26.0 mmHg.   Left Atrium: Left atrial size was normal in size.   Right Atrium: Right atrial size was normal in size.   Pericardium: There is no evidence of pericardial effusion.   Mitral Valve: The mitral valve is normal in structure. There is mild late  systolic prolapse of both leaflets. There are borderline criteria for  prolapse of the mitral valve. Mild mitral valve regurgitation. No evidence  of mitral valve stenosis.   Tricuspid Valve: The tricuspid valve is normal in structure. Tricuspid  valve regurgitation is mild . No evidence of tricuspid stenosis.   Aortic Valve: The aortic valve is tricuspid. Aortic valve regurgitation is  not visualized. No aortic stenosis is present.   Pulmonic Valve: The pulmonic valve was normal in structure. Pulmonic valve  regurgitation is trivial. No evidence of pulmonic stenosis.   Aorta: The aortic root is normal in size and structure.   Venous: The inferior vena cava is normal in size with greater than 50%  respiratory variability, suggesting right atrial pressure of 3 mmHg.   IAS/Shunts: No atrial level shunt detected by color flow Doppler.       Assessment & Plan   1.   SVT-denies recent episodes of accelerated or irregular heartbeat.  EKG today shows normal sinus rhythm 60 bpm. Continue Cardizem Avoid triggers caffeine, chocolate, EtOH, dehydration etc.  Essential hypertension-BP today 102/74. Continue current medical therapy Maintain blood pressure log Heart healthy low-sodium diet  Mitral valve regurgitation-no increased DOE or activity intolerance.  Noted to have mild mitral valve regurgitation 02/26/2021. Increase physical activity as tolerated Repeat echo when clinically indicated   Preoperative cardiac evaluation-colonoscopy, Dr. Loreta Ave, [facility], fax #[phone]    Primary Cardiologist: Verne Carrow, MD  Chart reviewed as part of pre-operative protocol coverage. Given past medical history and time since last visit, based on ACC/AHA guidelines, Beverly Cole would be at acceptable risk for the planned procedure without further cardiovascular testing.   Patient was advised that if she develops new symptoms prior to surgery to contact our office to arrange a follow-up appointment.  He verbalized understanding.  I will route this recommendation to the requesting party via Epic fax function and remove from pre-op pool.    Disposition: Follow-up with Dr. Clifton James or me in 12 months.  Thomasene Ripple. Iran Rowe NP-C     10/06/2022, 2:59 PM [facility] [address] Office [phone] Fax [phone]    I spent 14 minutes examining this patient, reviewing medications, and using patient centered shared decision making involving her cardiac care.  Prior to her visit I spent greater than 20 minutes reviewing her past medical history,  medications, and prior cardiac tests.

## 2022-10-05 ENCOUNTER — Other Ambulatory Visit: Payer: Self-pay | Admitting: Psychiatry

## 2022-10-05 DIAGNOSIS — R251 Tremor, unspecified: Secondary | ICD-10-CM

## 2022-10-05 DIAGNOSIS — F331 Major depressive disorder, recurrent, moderate: Secondary | ICD-10-CM

## 2022-10-05 DIAGNOSIS — F4001 Agoraphobia with panic disorder: Secondary | ICD-10-CM

## 2022-10-05 MED ORDER — SERTRALINE HCL 50 MG PO TABS
75.0000 mg | ORAL_TABLET | Freq: Every day | ORAL | 0 refills | Status: DC
  Filled 2022-10-05: qty 135, 90d supply, fill #0

## 2022-10-05 MED ORDER — PROPRANOLOL HCL 20 MG PO TABS
20.0000 mg | ORAL_TABLET | Freq: Two times a day (BID) | ORAL | 0 refills | Status: DC
Start: 2022-10-05 — End: 2022-12-30
  Filled 2022-10-05: qty 180, 90d supply, fill #0

## 2022-10-06 ENCOUNTER — Encounter: Payer: Self-pay | Admitting: General Practice

## 2022-10-06 ENCOUNTER — Other Ambulatory Visit: Payer: Self-pay

## 2022-10-06 ENCOUNTER — Other Ambulatory Visit (HOSPITAL_COMMUNITY): Payer: Self-pay

## 2022-10-06 ENCOUNTER — Ambulatory Visit: Payer: 59 | Attending: General Practice | Admitting: General Practice

## 2022-10-06 VITALS — BP 102/74 | HR 60 | Ht 69.0 in | Wt 175.0 lb

## 2022-10-06 DIAGNOSIS — Z01810 Encounter for preprocedural cardiovascular examination: Secondary | ICD-10-CM

## 2022-10-06 DIAGNOSIS — I10 Essential (primary) hypertension: Secondary | ICD-10-CM | POA: Diagnosis not present

## 2022-10-06 DIAGNOSIS — I471 Supraventricular tachycardia, unspecified: Secondary | ICD-10-CM | POA: Diagnosis not present

## 2022-10-06 DIAGNOSIS — I340 Nonrheumatic mitral (valve) insufficiency: Secondary | ICD-10-CM

## 2022-10-06 NOTE — Patient Instructions (Addendum)
Medication Instructions:  The current medical regimen is effective;  continue present plan and medications as directed. Please refer to the Current Medication list given to you today.  *If you need a refill on your cardiac medications before your next appointment, please call your pharmacy*   Lab Work: NONE If you have labs (blood work) drawn today and your tests are completely normal, you will receive your results only by: MyChart Message (if you have MyChart) OR A paper copy in the mail If you have any lab test that is abnormal or we need to change your treatment, we will call you to review the results.  Testing/Procedures: NONE  Other Instructions INCREASE PHYSICAL ACTIVITY AS TOLERATED   Follow-Up: At [facility], you and your health needs are our priority.  As part of our continuing mission to provide you with exceptional heart care, we have created designated Provider Care Teams.  These Care Teams include your primary Cardiologist (physician) and Advanced Practice Providers (APPs -  Physician Assistants and Nurse Practitioners) who all work together to provide you with the care you need, when you need it.  Your next appointment:   12 month(s)  Provider:   Verne Carrow, MD  or Edd Fabian, FNP

## 2022-10-08 DIAGNOSIS — K5730 Diverticulosis of large intestine without perforation or abscess without bleeding: Secondary | ICD-10-CM | POA: Diagnosis not present

## 2022-10-08 DIAGNOSIS — Z1211 Encounter for screening for malignant neoplasm of colon: Secondary | ICD-10-CM | POA: Diagnosis not present

## 2022-10-09 ENCOUNTER — Other Ambulatory Visit (HOSPITAL_COMMUNITY): Payer: Self-pay

## 2022-10-09 MED ORDER — MODAFINIL 200 MG PO TABS
300.0000 mg | ORAL_TABLET | Freq: Every morning | ORAL | 0 refills | Status: AC
Start: 2022-10-09 — End: ?
  Filled 2022-10-09: qty 135, 90d supply, fill #0

## 2022-10-23 ENCOUNTER — Ambulatory Visit (INDEPENDENT_AMBULATORY_CARE_PROVIDER_SITE_OTHER): Payer: 59 | Admitting: Psychology

## 2022-10-23 DIAGNOSIS — F331 Major depressive disorder, recurrent, moderate: Secondary | ICD-10-CM

## 2022-10-23 NOTE — Progress Notes (Signed)
[facility] Counselor/Therapist Progress Note  Patient ID: Beverly Cole, MRN: [id number],    Date: 10/23/2022  Time Spent: 3:00pm - 3:50pm   50 minutes   Treatment Type: Individual Therapy  Reported Symptoms: stress  Mental Status Exam: Appearance:  Casual     Behavior: Appropriate  Motor: Normal  Speech/Language:  Normal Rate  Affect: Appropriate  Mood: normal  Thought process: normal  Thought content:   WNL  Sensory/Perceptual disturbances:   WNL  Orientation: oriented to person, place, time/date, and situation  Attention: Good  Concentration: Good  Memory: WNL  Fund of knowledge:  Good  Insight:   Good  Judgment:  Good  Impulse Control: Good   Risk Assessment: Danger to Self:  No Self-injurious Behavior: No Danger to Others: No Duty to Warn:no Physical Aggression / Violence:No  Access to Firearms a concern: No  Gang Involvement:No   Subjective: Pt present for face-to-face individual therapy via video.  Pt consents to telehealth video session and is aware of limitations and benefits of virtual sessions. Location of pt: home Location of therapist: home office.   Pt talked about her husband Beverly Cole.  Beverly Cole has had health issues.  His prostrate levels were increased so he had an MRI and has to have a biopsy.    The MRI showed a level 3 mass in his prostrate.   Beverly Cole will have the biopsy Sept. 30th.  Addressed how Beverly Cole's prostrate issues are affecting pt.  She is worried about him and if it is cancer.   Helped pt process her feelings and worries.  Pt talked about Beverly Cole's sister having breast cancer.  Beverly Cole's mother is also not doing well regarding cognitive issues.  Addressed pt's concern about Beverly Cole's family.   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   Pt talked about planning to go to [place] again next year since Beverly Cole is getting a big bonus.      Worked on self care strategies.  Provided supportive therapy.    Interventions:  Cognitive Behavioral Therapy and Insight-Oriented  Diagnosis: F33.1  Plan of Care: Recommend ongoing therapy.   Pt participated in setting treatment goals.  Plan to meet monthly.    Treatment Plan Client Abilities/Strengths  Pt is bright, engaging, and motivated for therapy.   Client Treatment Preferences  Individual therapy.  Client Statement of Needs  Improve coping skills.  Symptoms  Depressed or irritable mood. Diminished interest in or enjoyment of activities. Lack of energy. Poor concentration and indecisiveness. Social withdrawal.  Problems Addressed  Unipolar Depression Goals 1. Alleviate depressive symptoms and return to previous level of effective functioning. 2. Appropriately grieve the loss in order to normalize mood and to return to previously adaptive level of functioning. Objective Learn and implement behavioral strategies to overcome depression. Target Date: 2022-10-30 Frequency: Biweekly  Progress: 50 Modality: individual  Related Interventions Engage the client in "behavioral activation," increasing his/her activity level and contact with sources of reward, while identifying processes that inhibit activation.  Use behavioral techniques such as instruction, rehearsal, role-playing, role reversal, as needed, to facilitate activity in the client's daily life; reinforce success. Assist the client in developing skills that increase the likelihood of deriving pleasure from behavioral activation (e.g., assertiveness skills, developing an exercise plan, less internal/more external focus, increased social involvement); reinforce success. Objective Identify important people in life, past and present, and describe the quality, good and poor, of those relationships. Target Date: 2022-10-30 Frequency: Biweekly  Progress: 50 Modality:  individual  Related Interventions Conduct Interpersonal Therapy beginning with the assessment of the client's "interpersonal inventory" of  important past and present relationships; develop a case formulation linking depression to grief, interpersonal role disputes, role transitions, and/or interpersonal deficits). Objective Learn and implement problem-solving and decision-making skills. Target Date: 2022-10-30 Frequency: Biweekly  Progress: 50 Modality: individual  Related Interventions Conduct Problem-Solving Therapy using techniques such as psychoeducation, modeling, and role-playing to teach client problem-solving skills (i.e., defining a problem specifically, generating possible solutions, evaluating the pros and cons of each solution, selecting and implementing a plan of action, evaluating the efficacy of the plan, accepting or revising the plan); role-play application of the problem-solving skill to a real life issue. Encourage in the client the development of a positive problem orientation in which problems and solving them are viewed as a natural part of life and not something to be feared, despaired, or avoided. 3. Develop healthy interpersonal relationships that lead to the alleviation and help prevent the relapse of depression. 4. Develop healthy thinking patterns and beliefs about self, others, and the world that lead to the alleviation and help prevent the relapse of depression. 5. Recognize, accept, and cope with feelings of depression. Diagnosis F33.1  Conditions For Discharge Achievement of treatment goals and objectives   Salomon Fick, LCSW

## 2022-11-13 ENCOUNTER — Telehealth (INDEPENDENT_AMBULATORY_CARE_PROVIDER_SITE_OTHER): Payer: 59 | Admitting: Psychiatry

## 2022-11-13 ENCOUNTER — Encounter: Payer: Self-pay | Admitting: Psychiatry

## 2022-11-13 ENCOUNTER — Other Ambulatory Visit (HOSPITAL_COMMUNITY): Payer: Self-pay

## 2022-11-13 DIAGNOSIS — F4001 Agoraphobia with panic disorder: Secondary | ICD-10-CM | POA: Diagnosis not present

## 2022-11-13 DIAGNOSIS — F411 Generalized anxiety disorder: Secondary | ICD-10-CM | POA: Diagnosis not present

## 2022-11-13 DIAGNOSIS — F4000 Agoraphobia, unspecified: Secondary | ICD-10-CM

## 2022-11-13 DIAGNOSIS — F331 Major depressive disorder, recurrent, moderate: Secondary | ICD-10-CM

## 2022-11-13 DIAGNOSIS — G250 Essential tremor: Secondary | ICD-10-CM | POA: Diagnosis not present

## 2022-11-13 DIAGNOSIS — F423 Hoarding disorder: Secondary | ICD-10-CM

## 2022-11-13 DIAGNOSIS — G2581 Restless legs syndrome: Secondary | ICD-10-CM | POA: Diagnosis not present

## 2022-11-13 DIAGNOSIS — F40240 Claustrophobia: Secondary | ICD-10-CM

## 2022-11-13 DIAGNOSIS — R251 Tremor, unspecified: Secondary | ICD-10-CM

## 2022-11-13 MED ORDER — AUVELITY 45-105 MG PO TBCR
1.0000 | EXTENDED_RELEASE_TABLET | Freq: Two times a day (BID) | ORAL | 0 refills | Status: DC
  Filled 2022-11-13 – 2022-12-06 (×2): qty 180, 90d supply, fill #0

## 2022-11-13 MED ORDER — GABAPENTIN 600 MG PO TABS
1200.0000 mg | ORAL_TABLET | Freq: Every evening | ORAL | 1 refills | Status: DC
Start: 2022-11-13 — End: 2023-01-20
  Filled 2022-11-13 – 2022-12-01 (×2): qty 180, 90d supply, fill #0

## 2022-11-13 MED ORDER — SERTRALINE HCL 50 MG PO TABS
75.0000 mg | ORAL_TABLET | Freq: Every day | ORAL | 0 refills | Status: DC
  Filled 2022-11-13 – 2023-01-13 (×2): qty 135, 90d supply, fill #0

## 2022-11-13 MED ORDER — MODAFINIL 200 MG PO TABS
300.0000 mg | ORAL_TABLET | Freq: Every morning | ORAL | 0 refills | Status: DC
Start: 2022-11-13 — End: 2023-01-20
  Filled 2022-11-13: qty 135, 90d supply, fill #0

## 2022-11-13 MED ORDER — ALPRAZOLAM 0.5 MG PO TABS
0.5000 mg | ORAL_TABLET | Freq: Two times a day (BID) | ORAL | 0 refills | Status: DC | PRN
Start: 2022-11-13 — End: 2023-06-23
  Filled 2022-11-13: qty 30, 15d supply, fill #0

## 2022-11-13 MED ORDER — OLANZAPINE 5 MG PO TABS
5.0000 mg | ORAL_TABLET | Freq: Every day | ORAL | 0 refills | Status: DC
  Filled 2022-11-13 – 2022-12-06 (×2): qty 90, 90d supply, fill #0

## 2022-11-13 NOTE — Progress Notes (Signed)
Beverly Cole [id number] [birth year] 62 y.o.  Video Visit via My Chart  I connected with pt by video using My Chart and verified that I am speaking with the correct person using two identifiers.   I discussed the limitations, risks, security and privacy concerns of performing an evaluation and management service by My Chart  and the availability of in person appointments. I also discussed with the patient that there may be a patient responsible charge related to this service. The patient expressed understanding and agreed to proceed.  I discussed the assessment and treatment plan with the patient. The patient was provided an opportunity to ask questions and all were answered. The patient agreed with the plan and demonstrated an understanding of the instructions.   The patient was advised to call back or seek an in-person evaluation if the symptoms worsen or if the condition fails to improve as anticipated.  I provided 30 minutes of video time during this encounter.  The patient was located at home and the provider was located office. Session 300-330 pm  Subjective:   Patient ID:  Beverly Cole is a 62 y.o. (DOB [birth year]) female.  Chief Complaint:  Chief Complaint  Patient presents with   Follow-up   Depression   Anxiety   Fatigue    Depression        Associated symptoms include decreased concentration and fatigue.  Associated symptoms include no suicidal ideas.  Past medical history includes anxiety.   Anxiety Symptoms include decreased concentration, dizziness, nervous/anxious behavior and shortness of breath. Patient reports no chest pain, confusion or suicidal ideas.     Beverly Cole  today for follow-up of chronic depression and anxiety.  Lately depression worse than anxiety.  Seen with husband today  When seen May 11, 2018.  For persistent anxiety we elected to retry buspirone and try increasing it to 30 mg twice daily if tolerated.   Couldn't tolerate it this time DT  muscle spasms.  Pharmacy called saying she could have serotonin syndrome.  When seen August 09, 2018 and she refused med changes despite chronic anxiety and depression. She remained on sertraline 50, Depakote ER 1000 mg, Wellbutrin SR 200 mg AM  Panic wearing cloth masks.  Using a shield.  Not in the office today bc H exposed to Covid.    seen December 09, 2018.  Because of chronic depression and dysfunction with fatigue and poor motivation we decided off label trial  trial of modafinil 200 mg 1/2 each am for 1 week then 200 mg each AM. Did see benefit from it.  More energy and working to stay out of bed more.    seen January 10, 2019.  The modafinil had been helpful.  No meds were changed.  seen April 07, 2019.  The following was noted: Still sees benefit from modafinil but still not caring for herself like she should.  Not wanting to go anywhere.  Not driven since early fall.  Hard to breathe with the mask and it creates anxiety.  Hard to be in enclosed spaces like cars and planes for extended periods.   Trying to set her alarm clock.  No hallucinations.  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  Plans to send more too.  Still follow through is not great.  Still depression and productivity is still poor but it is not as poor as it was before modafinil. Plan increase  modafinil to 300 mg daily to see if function can be improved.  06/29/2019 appointment, the following is noted: Rare Xanax.  Did increase modafinil to 300 mg daily. Saw benefit with the increase with less time in bed but still markedly functionally impaired.   It seems like I get used to it and then doesn't maintain her get up and go.  Still having a hard time with self care like hygiene. Chronic low motivation and lack of interest is unchanged.   Gall bladder problems.  It's better at the moment.    Not more sad, just like I've always been.  Can't make herself  leave the house DT depression and anxiety.  She won't do chores.   Less excess sleeping.  He works from home 2 days weekly.  No periods of hyperactivity or manic sx since the spring. Attending therapy every 2 weeks.   Can be confused when first wakes up regardless of time of day. Had episode of hallucination in the middle of the night when awakened. Scarecrow frightened her. This is rare event.  No hallucinations during the day.  Melatonin makes it worse. Has had fearful thoughts of planes crashing or bad things happening to others and it might be her fault.  Fight with brother lately and not speaking with him now. Plan:  No med changes  10/11/19 appt with the following noted: Unsteady and tremors gotten worse.  Not gone to doctor. Propranolol not helping as much.   On Depakote ER 1000mg  HS which is a reduction. So tired all the time and H says she's not doing anything.  H says accomplishing littte and still lays in bed a lot.  H says it's 2-3 PM before she gets OOB.  Memory is poor.  Everything is such a chore and doesn't want to do things. H says she starts things she doesn't finish.  Lots of new projects.   Increase modafinil on her own to 400 mg daily in the AM.  Anxious.  Rare Xanax.  Was happy when got new cats in the summer.  Anxious and Depressed and describes anxiety as Moderate. Anxiety symptoms include: Excessive Worry,.  She thinks sertraline reduces her obsessiveness.  Past hx panic so avoidant. Never get to relax.  Pt reports sleeps excessively even with modafinil. Pt reports that appetite is good. Pt reports that energy is poor and loss of interest or pleasure in usual activities, poor motivation and withdrawn from usual activities. Just don't care about things and admits to a lot of general anxiety and everything takes a lot of energy out of her.  Compulsively tapes and watches TV shows, news and awards shows.  Can't delete it bc I'll miss something.  Watches TV in bed. Concentration is  poor. Suicidal thoughts:  denied by patient. But chronic death thoughts.  Poor productivity overall.  Chronic poor self care, showers only weekly.  Just don't care.  Chronic disability.  She thinks sertraline helped the anxiety some.  Likes that H is at home.  Helps her mood.  Doesn't care about going out. Feels faint when she tries to wear a mask.  Too anxious driving and is avoidant. Collects TV shows, she can't erase unless she watches entirely. Hoards some bc afraid she might need it some day.   Rare use of Xanax bc fears addiction. Plan: Attempted referral to neurology at [facility].  They refused to see patient stating they had nothing to offer her. It was suggested to patient that she  try to get in with Dr. Anne Hahn whom she had seen in the past.  01/10/2020 appointment with the following noted: Reduced modafinil to 300 mg bc didn't want to get hooked on anything about 2-3 weeks.  Did initially see benefit from the modafinil but seemed to get tolerant to it. Feels better not talking to brother bc differing views of politics.  Feels he's a negative person but never depressed. Overall thinks she's doing pretty well relatively.  However is chronically depressed and never happy.  Nothing changed with the meds. Depression worse than anxiety. H didn't think modafinil made much difference.  H CO her inactivity.. She felt it was helpful for energy initially. Had to cancel trips bc can't wear a mask for that long.  Makes her sad. Driven twice in a year or so DT anxiety. Wants prn Xanax. Plan: DC sertraline Start fluoxetine 20 mg daily with olanzapine 5 mg daily.  05/22/2020 appointment with following noted: Didn't remember to stop sertraline and took fluoxetine and olanzapine for 2 days and couldn't eat and stopped it. Then went to dermatologist December who told her couldn't take Zoloft with fungus med and stopped sertraline. Then became more depressed.  I have to go back on the  Zoloft. Remained on modafinil, Wellbutrin, Depakote.  Even less motivated to do anything like cook, clean.  Won't go to [employer] etc. Chronic depression with hopelessness to get better Plan: No med changes  07/31/2020 appointment with the following noted: Back on sertraline at 75 mg daily with Remained on modafinil, Wellbutrin, Depakote.  Aunt died and one of worst fears but not thinking about it. Family got Covid but she recovered.  She thought it might kill and didn't care. Tremors are getting worse and balance problems. Went to concert and that was a problem.  Enjoyed the concert. Also saw Athena Masse.  Music always important to her.  1980 had planned to commit suicide listening to an album over and over but didn't attempt. B and M have familial tremor also. Still anxious and depressed and haven't driven in a year DT anxiety. Plan: Option of trial olanzapine alone without fluoxetine.  Rec retry.  She agrees.  10/23/2020 appointment with the following noted: "Olanzapine is really working."  Initial SE drunk at 5 mg HS so cut it in half.  Got used to it but I feel happy and more relaxed and easier time going to sleep.  If I could still chose to die I would but clearly happier but not more hopeful.  Laughing more.  No SI. Needs to care about things more.  Chronic lack of motivations. Still doesn't want to leave the house but did enjoy a concert in [place]. Chronic unsteadiness is worse.  Last disc with doctor in March who rec PT but she never did the PT.  Misses aunt who died.   She reduced the Depakote to 500 mg HS. When stopped sertraline was not doing well and restarted it bc stressed and unhappy.  Seems to be a key med for me. Pending card test soon.  Feels better than in the past. RLS is worse after starting olanzapine and even affecting arms but had it before.  Happens after lays down at night.  Takes MG which helps.   Xanax about once every 3 weeks. Plan: Successful trial  olanzapine 2.5 mg with sertraline 50 but worse RLS Trial gabapentin for RLS 100-300 mg PM and disc SE.  Higher if needed..  Once controlled consider increase olanzapine.  01/23/2021 appointment with the following noted: Taking gabapentin 300 mg but needs to go higher for RLS.  SE dizzy and reduced concentration. Reads on internet.   Increased olanzapine to 5 mg HS a couple of mos ago and feels better with it.  Happier.  More relaxed.  Only think that's helped in a long time. Handwriting is so much better, less shakey. Has RLS and can be bad when she lays down. Got a new kitten and enjoys it.  Now has 4 cats. Afraid to try new meds after negative reaction to Vraylar.  Afraid of any med change.  Afraid of having SI and afraid she'd do it if had SI.   Saw pulmonologist over weakness and he says she's OK except deconditioned.  Can't tolerate wearing the masks. Plan: Successful trial olanzapine 5 mg with sertraline 50 for depression and anxiety but worse RLS Consider increasing the olanzapine later if RLS can be managed. increase gabapentin for RLS 400-600 mg PM and disc SE.  Higher if needed..  Once controlled consider increase olanzapine.  03/28/2021 appointment with the following noted: Uses Ivory soap behind knee with benefit.  Increased gabapentin to 600 mg PM.  Dizziness resolved which occurred when turned over in bed.  Not a problem. RLS still 5/7 days.  But had no vacation RLS bc was walking more.  Not active at home. Sleep is great 8-9 hours and best in a long time. Enjoyed expensive vacation to [facility].  Once in a lifetime thing.  Some things she didn't like for example the cost. Went to [employer] concert and others. Depression is better.  Olanzapine really helped.  Didn't expect it.   Hasn't felt this good in years and H notices.   Caffeine 2 cups hot tea and limited. Plan: Successful trial olanzapine 5 mg with sertraline 50 for depression and anxiety but worse RLS Consider increasing  the olanzapine later if RLS can be managed. increase gabapentin for ok to increase RLS 600-900 mg PM and disc SE.  Higher if needed..  Once controlled consider increase olanzapine.  05/27/2021 phone call wanting to increase olanzapine to see if it will further help depression.  She had increased it on her own to 10 mg for a couple of weeks.  She has to increase to 15 mg.  This was agreed  06/26/21 appt noted: On sertraline 50 and olanzapine 15 mg HS with 1200 mg PM gabapentin for RLS RLS kicks in about 4 hours after olanzapine.  Prefers to stay up late bc doesn't really want to go to sleep.  RLS 5 days per week. Sleep 8 hours. Caffeine 2 cups hot tea and limited. Benefit from increase olanzapine. Helped anxiety and able to go to sleep earlier than usual.   Before trip was happier than I've been in years.  Not real happy with trip and now nothing to look forward to.   SE some dizziness with gabapentin.  But it's better.   Mood still better than before olanzapine. Plan: benefit olanzapine 15 mg with sertraline 50 for depression and anxiety but worse RLS She wants to Consider increasing the olanzapine later if RLS can be managed. OK increase olanzapine to 20 mg PM If RLS is worse then will have to reduce.  08/26/2021 appointment with the following noted: Increased olanzapine 20 mg HS.  Not sure if it made a difference.  Terrible time with memorfy ongoing. Sleep good.  Still has RLS and can get pain in her arm including yesterday at  6-7 PM.   Asks about Ozempic for OCD. Depends on H [name]. Anxiety is better and that helps sleep.  Depression better than it was.  No longer thinks of death all the time like she use to.  Still not very active.  Need something to look forward to. Stays up late and likes doing so bc less depressed in evening.   Plan continue olanzapine 20 mg daily with sertraline 75 mg daily  11/26/2021 appointment noted:  seen with Al Decant Memory is poor.  Forgets TV shows. Taking  alprazolam rarely. Overall thinks she is doing fine.  Still not driving much.  Don't like to leave the house but went outside on the deck some which is improvement. Sleeps 12 hours nightly but then up all day. Happier with olanazapine and thinks the higher doses did help the anxiety. H doesn't see any changes in the last year.  Covid messed her up and she has become housebound.  Not social.  She got used to being inside and no motivation to change it. H cleans and prepares meals.  Doesn't have motivation for chores. Plan: continue sertraline 50 for depression and anxiety  She wants to Consider increasing the olanzapine later if RLS can be managed. Reduce olanzapine to 10 mg HS bc unclear if higher dose helped more than lower. RLS is better with MG Modafinil 300 to 400 mg daily for alertness and off label for treatment resistant depression Trial Auvelity for TRD 1 in AM for 1 week, then 1 twice daily. DC Wellbutriin  12/25/2021 phone call requesting 90-day prescription of Auvelity stating that it was working well.  02/19/22 appt noted: Current psych med: rare Xanax, Auvelity BID, no fluoxetine, modafinil 300 mg AM , olanzapine 10 pm, propranolol 20 BID, sertraline 75 mg daily. Gabapentin 1200 mg daily I feel like doing things more and I see things more clearly.  Realizes now buying things to make myself feel happier and not really a buying problem per se.  Now not driven to buy things she shouldn't.   Feels embarrassed by what she did before with this.  Had bought a lot of expensive baskets and now no longer does that.  She is happier she's better and H notices it too.  Putting things away and better cleaning.  Tired a lot.  Better motivation..  had lunch with friends scheduled for Friday but is cancelling.  Not sure whay she is avoidant socially.  Anxious about it.  Almost fears a panic attack.  Not sure when had last panic but had them when she tried to go to work.  I can't.  Doesn't drive often.   Has a Zenaida Niece but doesn't like driving it.  Can't drive at night.   Still sleeps 12 hours in 24.   Happy the way I am right now. Some trouble with restless legs. Plan: no med changes Less depressed with Auvelity for TRD BID markedly  04/23/22 appt noted: "Haven't felt this good in years." Consistent with Auvelity BID.   Current psych meds: modafinil 300 Am, Auvelity BID, sertraline 75, olanzapine 10 mg HS, propranolol 20 BID. Asks about going off olanzapine. 2 cats put to sleep and she did well with it despite in past had SI when doing it. Still sleeps 12 hour daily bc so tired and wants to try stopping it. No RLS on gabapentin.  To [place] next month.   Plan: continue sertraline 75 for depression and anxiety  Less depressed with Auvelity for TRD BID  markedly, best in years. Ok trial reduction to olanzapine to 5  mg HS .  After 30 D if well can stop it. RLS is better with MG  06/24/22 appt noted: Not often with alprazolam other than sleep if needed.   Psych med: Auvelity BID , gabapentin 1200 pm for back pain, modafinil 300 AM, olanzapine 5 mg HS, sertraline 75 mg daily, propranolol 20 BID, alprazolam 0.5 mg prn When reduced olanzapine from 10 to 5 then not as happy. But is less sleepy and tired and wants to stay on the lower dose for now. Overall still doing good and "very good".  Hurt back packing for [place] and still some back problems.  Went to [place].  Had a good time there.  Bad weather cancelled the snorkeling trip.   Overall satisfied with current problems.  Plan no med changes  11/13/22 appt noted: Psych med: Auvelity BID, gabapentin 600 pm for back pain & RLS, modafinil 300 AM, olanzapine 5 mg HS, sertraline 75 mg daily, propranolol 20 BID, alprazolam 0.5 mg prn Stress cousin bone CA really upsetting.  B needing neck surgery.  H prostate bx pending. CBD oil in drink helps her anxiety.  Just relaxes her and doesn't drive after  it or take it excessively.   Memory is not good.  Can  forget what she says in conversation.  Took memory test at PCP and it was normal.  This is not new.   She feels she has maintained the benefit from [place].  Now I don't want to die which is unbelievable bc has wanted to die for 50 years.  Now just starting life again.  But now is worrying about dying.  Both parents had CA and B had CA. Cont problems with RLS.  Will occur in arms as well.  Reversed sleep schedule.  Stay up all night.  Sleep 8-10 hours in day.   Still using caffeine.  Past psych med trials:  Wellbutrin XL 300, Sertraline 75, fluoxetine SE brief ,  Auvelity marked positive response Vraylar SI, Latuda, Abilify , olanzapine 20 Symbyax side effects Seroquel which caused side effects of constipation,   lamotrigine,  refuses lithium because of altered taste.   pramipexole,  Modafinil 300-400  Hx primidone sed Propranolol 20 BID helps tremor and anxiety  She has a history of ataxia on 1500 mg of Depakote.   buspirone SE hallucinations Gabapentin for RLS  ECT 2016 without help,   Review of Systems:  Review of Systems  Constitutional:  Positive for fatigue.  Respiratory:  Positive for shortness of breath.   Cardiovascular:  Negative for chest pain.  Gastrointestinal:  Positive for abdominal pain. Negative for vomiting.  Musculoskeletal:  Positive for gait problem.  Neurological:  Positive for dizziness and tremors. Negative for weakness.       RLS  Psychiatric/Behavioral:  Positive for decreased concentration, dysphoric mood and sleep disturbance. Negative for agitation, behavioral problems, confusion, hallucinations, self-injury and suicidal ideas. The patient is nervous/anxious. The patient is not hyperactive.     Medications: I have reviewed the patient's current medications.  Current Outpatient Medications  Medication Sig Dispense Refill   aspirin EC 325 MG tablet Take 325 mg by mouth daily.     clobetasol ointment (TEMOVATE) 0.05 % Apply topically to nail,  proximal nail fold daily on weekends only. Avoid applying to face, groin, and axilla. Use as directed. 30 g 3   dexlansoprazole (DEXILANT) 60 MG capsule Take 1 capsule (60 mg total) by mouth  daily. 90 capsule 1   diltiazem (CARTIA XT) 180 MG 24 hr capsule Take 1 capsule (180 mg total) by mouth daily. 90 capsule 1   fexofenadine (ALLEGRA ALLERGY) 180 MG tablet Take 1 tablet (180 mg total) by mouth daily.     fluticasone (FLONASE) 50 MCG/ACT nasal spray Place 2 sprays into the nose daily as needed. For allergies     lidocaine (XYLOCAINE) 5 % ointment Apply a pea sized amount topically 20 minutes prior to intercourse, wipe off just prior to intercourse. 30 g 1   NONFORMULARY OR COMPOUNDED ITEM sm Azelaic acid/ metronidazole/ivermectin 15%/1%/1% cream apply to face bid 1 each 5   polyethylene glycol (GOLYTELY) 236 g solution Use as directed 4000 mL 0   Probiotic Product (PROBIOTIC DAILY PO) Take 1 tablet by mouth daily.      propranolol (INDERAL) 20 MG tablet Take 1 tablet (20 mg total) by mouth 2 (two) times daily. 180 tablet 0   tretinoin (RETIN-A) 0.025 % cream Apply topically at bedtime. 45 g 11   trimethoprim (TRIMPEX) 100 MG tablet take as directed after intercourse 30 tablet 2   ALPRAZolam (XANAX) 0.5 MG tablet Take 1 tablet (0.5 mg total) by mouth 2 (two) times daily as needed. 30 tablet 0   Dextromethorphan-buPROPion ER (AUVELITY) 45-105 MG TBCR Take 1 tablet by mouth 2 (two) times daily. 180 tablet 0   gabapentin (NEURONTIN) 600 MG tablet Take 2 tablets (1,200 mg total) by mouth every evening. 180 tablet 1   modafinil (PROVIGIL) 200 MG tablet Take 1&1/2 tablets (300 mg total) by mouth in the morning. 135 tablet 0   OLANZapine (ZYPREXA) 5 MG tablet Take 1 tablet (5 mg total) by mouth at bedtime. 90 tablet 0   sertraline (ZOLOFT) 50 MG tablet Take 1.5 tablets (75 mg total) by mouth daily. 135 tablet 0   No current facility-administered medications for this visit.    Medication Side Effects:  None  Allergies:  Allergies  Allergen Reactions   Cariprazine Other (See Comments)    Patient becomes suicidal when taking this medication.  Patient becomes suicidal when taking this medication.    Metoprolol Other (See Comments)    Hallucinations hair falls out Hallucinations hair falls out   Nickel Rash   Iron Nausea And Vomiting    Past Medical History:  Diagnosis Date   Anxiety    Atrial fibrillation (HCC)    a. Event monitor 2013 - PACs/bradycardia/SVT/short run of atrial fib by event monitor.    BIPOLAR DISORDER UNSPECIFIED    Bradycardia    Bursitis of hip 09/1999   Bilateral - Dr. Luiz Blare   DEPRESSION    Emphysema lung (HCC) 06/20/2016   pt states pulmonalongist stated started recently   GERD    Headache(784.0)    was with menstrual cycle. no longer a problem   HYPERLIPIDEMIA    Hypertension    Hypertension    IRRITABLE BOWEL SYNDROME, HX OF    Menopausal state 01/2012   [facility] = 88.5   Mitral valve prolapse 12/17/2000   a. dx 1980s, most recent echo did not demonstrate this.   MYALGIA    Normal coronary arteries    a. by cardiac CT 2013.   PAT (paroxysmal atrial tachycardia) (HCC)    PE (pulmonary embolism) 02/09/2015   a. Bilateral PEs 01/2015 when d-dimer 0.69, CP lying on left side. Followed by heme-onc - + lupus anticoagulant, mildly depressed protein S. Dr. Myna Hidalgo is not certain if her hypercoagulable studies are  significant for a thrombophilic state.   Premature atrial contractions    Pulmonary embolus (HCC) 02/10/2015   Shortness of breath    Pulmonary eval 05/2002   Thyroid nodule 2014   Tremors of nervous system     Family History  Problem Relation Age of Onset   Lung cancer Mother    Hypertension Mother    Hyperlipidemia Mother    Cancer Father        Esophageal cancer   Hyperlipidemia Father    Depression Father    Cancer Brother    Pulmonary embolism Brother    Colon cancer Paternal Uncle    Sarcoidosis Other    Testicular cancer Other      Social History   Socioeconomic History   Marital status: Married    Spouse name: Not on file   Number of children: 0   Years of education: Not on file   Highest education level: Associate degree: academic program  Occupational History   Occupation: Nurse-Personal Care/HH    Employer: UNEMPLOYED  Tobacco Use   Smoking status: Former    Current packs/day: 0.00    Average packs/day: 1 pack/day for 25.0 years (25.0 ttl pk-yrs)    Types: Cigarettes    Start date: 02/11/1971    Quit date: 02/11/1996    Years since quitting: 26.7   Smokeless tobacco: Never   Tobacco comments:    Married, lives with spouse. 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       No exercise   Social Determinants of Health   Financial Resource Strain: Low Risk  (06/20/2022)   Overall Financial Resource Strain (CARDIA)    Difficulty of Paying Living Expenses: Not hard at all  Food Insecurity: No Food Insecurity (06/20/2022)   Hunger Vital Sign    Worried About Running Out of Food in the Last Year: Never true    Ran Out of Food in the Last Year: Never true  Transportation Needs: No Transportation Needs (06/20/2022)   PRAPARE - Administrator, Civil Service (Medical): No    Lack of Transportation (Non-Medical): No  Physical Activity: Unknown (06/20/2022)   Exercise Vital Sign    Days of Exercise per Week: 0 days    Minutes of Exercise per Session: Not on file  Stress: Stress Concern Present (06/20/2022)   [employer] of Occupational Health - Occupational Stress Questionnaire    Feeling of Stress : Very much  Social Connections: Socially Isolated (06/20/2022)   Social  Connection and Isolation Panel [NHANES]    Frequency of Communication with Friends and Family: Never    Frequency of Social Gatherings with Friends and Family: Never    Attends Religious Services: Never    Database administrator or Organizations: No    Attends Engineer, structural: Not on file    Marital Status: Married  Catering manager Violence: Not on file    Past Medical History, Surgical history, Social history, and Family history were reviewed and updated as appropriate.   Please see review of systems for further details on the patient's review from today.  Objective:   Physical Exam:  LMP 05/11/2012   Physical Exam Neurological:     Mental Status: She is alert and oriented to person, place, and time.     Cranial Nerves: No dysarthria.  Psychiatric:        Attention and Perception: Attention and perception normal.        Mood and Affect: Mood is anxious. Mood is not depressed. Affect is tearful.        Speech: Speech normal.        Behavior: Behavior is cooperative.        Thought Content: Thought content normal. Thought content is not paranoid or delusional. Thought content does not include homicidal or suicidal ideation. Thought content does not include suicidal plan.        Cognition and Memory: Cognition and memory normal.     Comments: Insight and judgment fair and less impulsive Chronic anxiety and avoidant Best mood in years.  But sad over cancer in family and worry over H Still better with meds.     Lab Review:     Component Value Date/Time   NA 142 04/29/2022 1341   NA 145 (H) 04/05/2020 1508   NA 142 01/21/2017 1013   NA 143 01/16/2016 1005   K 4.2 04/29/2022 1341   K 3.2 (L) 01/21/2017 1013   K 3.3 (L) 01/16/2016 1005   CL 107 04/29/2022 1341   CL 106 01/21/2017 1013   CO2 24 04/29/2022 1341   CO2 29 01/21/2017 1013   CO2 20 (L) 01/16/2016 1005   GLUCOSE 116 (H) 04/29/2022 1341   GLUCOSE 95 01/21/2017 1013   BUN 14 04/29/2022 1341    BUN 14 04/05/2020 1508   BUN 9 01/21/2017 1013   BUN 12.6 01/16/2016 1005   CREATININE 0.84 04/29/2022 1341   CREATININE 0.94 12/20/2019 1449   CREATININE 0.9 01/21/2017 1013   CREATININE 0.9 01/16/2016 1005   CALCIUM 9.1 04/29/2022 1341   CALCIUM 9.2 01/21/2017 1013   CALCIUM 9.5 01/16/2016 1005   PROT 7.2 04/29/2022 1341   PROT 7.2 04/05/2020 1508   PROT 6.9 01/21/2017 1013   PROT 7.1 01/16/2016 1005   ALBUMIN 4.2 04/29/2022 1341   ALBUMIN 4.6 04/05/2020 1508   ALBUMIN 3.6 01/16/2016 1005   AST 18 04/29/2022 1341   AST 13 (L) 12/20/2019 1449   AST 16 01/16/2016 1005   ALT 17 04/29/2022 1341   ALT 15 12/20/2019 1449   ALT 22 01/21/2017 1013   ALT 24 01/16/2016 1005   ALKPHOS 94 04/29/2022 1341   ALKPHOS 62 01/21/2017 1013   ALKPHOS 81 01/16/2016 1005   BILITOT 0.2 04/29/2022 1341   BILITOT <0.2 04/05/2020 1508   BILITOT 0.3 12/20/2019 1449   BILITOT 0.59 01/16/2016 1005   GFRNONAA 76 04/05/2020 1508   GFRNONAA >60 12/20/2019 1449   GFRAA 87 04/05/2020 1508   GFRAA >60 12/03/2018 1346       Component Value Date/Time   WBC 9.6 04/29/2022 1341   RBC 4.90 04/29/2022 1341   HGB 12.3 04/29/2022 1341   HGB 13.7 04/05/2020 1508   HGB 12.8 01/21/2017 1013   HCT 38.1 04/29/2022 1341   HCT 41.4 04/05/2020 1508   HCT 38.2 01/21/2017 1013   PLT 380.0 04/29/2022 1341   PLT 289 04/05/2020 1508   MCV 77.7 (L) 04/29/2022 1341   MCV 85 04/05/2020 1508   MCV 90 01/21/2017 1013   MCH 28.0 04/05/2020 1508   MCH 28.9 12/20/2019 1449  MCHC 32.2 04/29/2022 1341   RDW 17.8 (H) 04/29/2022 1341   RDW 15.0 04/05/2020 1508   RDW 14.5 01/21/2017 1013   LYMPHSABS 2.8 04/29/2022 1341   LYMPHSABS 2.5 04/05/2020 1508   LYMPHSABS 3.6 (H) 01/21/2017 1013   MONOABS 0.5 04/29/2022 1341   EOSABS 0.2 04/29/2022 1341   EOSABS 0.1 04/05/2020 1508   EOSABS 0.5 01/21/2017 1013   BASOSABS 0.1 04/29/2022 1341   BASOSABS 0.1 04/05/2020 1508   BASOSABS 0.0 01/21/2017 1013    No results found  for: "POCLITH", "LITHIUM"   Lab Results  Component Value Date   PHENYTOIN <0.5 ug/mL (L) 11/05/2006   VALPROATE 73.7 11/25/2016     .res Assessment: Plan:    Beverly Cole was seen today for follow-up, depression, anxiety and fatigue.  Diagnoses and all orders for this visit:  Major depressive disorder, recurrent episode, moderate (HCC) -     Dextromethorphan-buPROPion ER (AUVELITY) 45-105 MG TBCR; Take 1 tablet by mouth 2 (two) times daily. -     modafinil (PROVIGIL) 200 MG tablet; Take 1&1/2 tablets (300 mg total) by mouth in the morning. -     OLANZapine (ZYPREXA) 5 MG tablet; Take 1 tablet (5 mg total) by mouth at bedtime.  Generalized anxiety disorder -     gabapentin (NEURONTIN) 600 MG tablet; Take 2 tablets (1,200 mg total) by mouth every evening.  Panic disorder with agoraphobia -     ALPRAZolam (XANAX) 0.5 MG tablet; Take 1 tablet (0.5 mg total) by mouth 2 (two) times daily as needed. -     gabapentin (NEURONTIN) 600 MG tablet; Take 2 tablets (1,200 mg total) by mouth every evening. -     sertraline (ZOLOFT) 50 MG tablet; Take 1.5 tablets (75 mg total) by mouth daily.  Claustrophobia  Benign essential tremor  Uncontrolled restless legs syndrome -     gabapentin (NEURONTIN) 600 MG tablet; Take 2 tablets (1,200 mg total) by mouth every evening.  Tremor  Agoraphobia -     ALPRAZolam (XANAX) 0.5 MG tablet; Take 1 tablet (0.5 mg total) by mouth 2 (two) times daily as needed.  Hoarding behavior    Poor sleep hygiene.  Improved with modafinil and motivation is better with it but wants to increase it.  TRD.  Lifelong history of chronic anhedonic depression and anxiety with very poor functioning until Auvelity She is highly dysfunctional historically but much better with Auvelity.  She is not currently manic.  She is tolerating the medications.  She has had multiple medication failures as noted above.   Failed all FDA approved bipolar depression meds except Symbyax.  Couldn't  tolerate 2 days of Symbyax However her depression and treatment resistant anxiety are markedly improved with olanzapine and sertraline in combination. Best response ever with current med regimen including with Auvelity addition.     continue sertraline 75 for depression and anxiety  Less depressed with Auvelity for TRD BID markedly, best in years. Ok with reduction to olanzapine to 5  mg HS but is a little less happy without lower dose but more alert.  Doesn't want to stop for reasons noted. RLS is better with MG  Increase  gabapentin back to  RLS 1200 mg PM and disc SE but taking also for back pain.    If RLS is better with less olanzapine then can reduce gabapentin. Caffeine can worsen RLS.  Disc timing in relation to olanzapine. Caution about excessive dizziness from gabapentin and fall risk.  Tolerated ok right now. RLS is  not as bad as it was.   Discussed potential metabolic side effects associated with atypical antipsychotics, as well as potential risk for movement side effects. Advised pt to contact office if movement side effects occur.  For tremor is considering increasing the propranolol continue 20 BID She'll check with cardiologist on this.  Push fluids bc so much Time in bed and past history of dehydration. Disc risk propranolol but it helps tremor.  Counseling 19 min:  Increase activity.  Work on sleep schedule and try to regulate it for overall mental health benefits but it is ok for her to reverse schedule per her request but be consistent.  Keep working on improving activity.  Every little bit of progress can be additive over time.  Doesn't drive much.  She is not motivated to do these things right now. Grief counseling for family members with CA  Modafinil would be likely safer than traditional stimulants which could cause psychotic sx.  Discussed side effects.  She has had none.   Continue other meds. Dont' increase further, Benefit modafinil.  Okay to continue 300-400 mg  daily  No med changes: fragile and too much risk to reduce meds after feeling so much better. Alprazolam 0.5 mg twice daily as needed, Auvelity twice daily, gabapentin increased back to 1200 every afternoon for restless legs and pain, modafinil 300-400 every morning, olanzapine 5 mg nightly, sertraline 75 mg daily, propranolol 20 mg twice daily for tremor  FU 8 weeks  Meredith Staggers, MD, DFAPA   Please see After Visit Summary for patient specific instructions.  Future Appointments  Date Time Provider Department Center  12/02/2022  3:00 PM Bauert, Rella Larve, [place] [facility] None  12/18/2022  3:00 PM Bauert, Rella Larve, LCSW [facility] None  01/15/2023  3:00 PM Bauert, Terri W, LCSW [facility] None    No orders of the defined types were placed in this encounter.     -------------------------------

## 2022-11-20 ENCOUNTER — Ambulatory Visit: Payer: 59 | Admitting: Psychology

## 2022-12-01 ENCOUNTER — Other Ambulatory Visit: Payer: Self-pay | Admitting: Family Medicine

## 2022-12-01 DIAGNOSIS — I10 Essential (primary) hypertension: Secondary | ICD-10-CM

## 2022-12-02 ENCOUNTER — Other Ambulatory Visit (HOSPITAL_COMMUNITY): Payer: Self-pay

## 2022-12-02 ENCOUNTER — Other Ambulatory Visit: Payer: Self-pay

## 2022-12-02 ENCOUNTER — Ambulatory Visit (INDEPENDENT_AMBULATORY_CARE_PROVIDER_SITE_OTHER): Payer: 59 | Admitting: Psychology

## 2022-12-02 DIAGNOSIS — F331 Major depressive disorder, recurrent, moderate: Secondary | ICD-10-CM

## 2022-12-02 MED ORDER — DILTIAZEM HCL ER COATED BEADS 180 MG PO CP24
180.0000 mg | ORAL_CAPSULE | Freq: Every day | ORAL | 1 refills | Status: DC
  Filled 2022-12-02: qty 90, 90d supply, fill #0
  Filled 2023-03-03: qty 90, 90d supply, fill #1

## 2022-12-02 NOTE — Progress Notes (Signed)
[facility] Counselor Initial Adult Exam  Name: Beverly Cole Date: 12/02/2022 MRN: [id number] DOB: 1961/01/13 PCP: Donato Schultz, DO  Time spent: 3:00pm-3:50pm     Guardian/Payee:  n/a    Paperwork requested: No   Reason for Visit /Presenting Problem: Pt present for face-to-face initial assessment update via video.  Pt consents to telehealth video session and is aware of limitations and benefits of virtual sessions. Location of pt: home Location of therapist: home office.  Pt talked about concerns about her [name] who has been diagnosed with prostrate cancer.  [name] will be imaged every 6 months but will not need treatment at this point.  Pt talked about her cousin having bone cancer that has spread.  Pt is very upset.   Pt states [name]'s sister has breast cancer. [name]'s sister's father in law recently committed suicide at age 39.  Addressed how these losses and family concerns are impacting pt.   Pt continues to have issues with depression and isolation.   She needs to continue in therapy monthly. Reviewed pt's treatment plan for annual update.   Updated pt's treatment plan and IA.   Pt participated in setting treatment goals.  Plan to meet monthly.   Mental Status Exam: Appearance:   Casual     Behavior:  Appropriate  Motor:  Normal  Speech/Language:   Normal Rate  Affect:  Appropriate  Mood:  normal  Thought process:  normal  Thought content:    WNL  Sensory/Perceptual disturbances:    WNL  Orientation:  oriented to person, place, time/date, and situation  Attention:  Good  Concentration:  Good  Memory:  WNL  Fund of knowledge:   Good  Insight:    Good  Judgment:   Good  Impulse Control:  Good    Reported Symptoms:  sadness, low motivation.  Risk Assessment: Danger to Self:  No Self-injurious Behavior: No Danger to Others: No Duty to Warn:no Physical Aggression / Violence:No  Access to Firearms a concern: No  Gang  Involvement:No  Patient / guardian was educated about steps to take if suicide or homicide risk level increases between visits: n/a While future psychiatric events cannot be accurately predicted, the patient does not currently require acute inpatient psychiatric care and does not currently meet [facility] involuntary commitment criteria.  Substance Abuse History: Current substance abuse: No     Past Psychiatric History:   Previous psychological history is significant for depression Outpatient Providers:pt has been in therapy throughout her life. History of Psych Hospitalization: No Psychological Testing:  n/a    Abuse History:  Victim of: No.,  n/a    Report needed: No. Victim of Neglect:No. Perpetrator of  n/a   Witness / Exposure to Domestic Violence: No   Protective Services Involvement: No  Witness to [employer] Violence:  No   Family History:  Family History  Problem Relation Age of Onset   Lung cancer Mother    Hypertension Mother    Hyperlipidemia Mother    Cancer Father        Esophageal cancer   Hyperlipidemia Father    Depression Father    Cancer Brother    Pulmonary embolism Brother    Colon cancer Paternal Uncle    Sarcoidosis Other    Testicular cancer Other     Living situation: the patient lives with her spouse.  Pt grew up with both parents and older brother.  Family history of mental health issues:  depression in  mother. No history of substance abuse or abuse.   Sexual Orientation: Straight  Relationship Status: married  Name of spouse / other:[name] If a parent, number of children / ages:n/a  Support Systems: spouse  Financial Stress:  No   Income/Employment/Disability: Employment by husband.    Pt is on disability.  Military Service: No   Educational History: Education: Risk manager: Protestant  Any cultural differences that may affect / interfere with treatment:  not applicable    Recreation/Hobbies: reading  Stressors: Health problems    Strengths: Supportive Relationships and Able to Communicate Effectively  Barriers:  none   Legal History: Pending legal issue / charges: The patient has no significant history of legal issues. History of legal issue / charges:  n/a  Medical History/Surgical History: reviewed Past Medical History:  Diagnosis Date   Anxiety    Atrial fibrillation (HCC)    a. Event monitor 2013 - PACs/bradycardia/SVT/short run of atrial fib by event monitor.    BIPOLAR DISORDER UNSPECIFIED    Bradycardia    Bursitis of hip 09/1999   Bilateral - Dr. Luiz Blare   DEPRESSION    Emphysema lung (HCC) 06/20/2016   pt states pulmonalongist stated started recently   GERD    Headache(784.0)    was with menstrual cycle. no longer a problem   HYPERLIPIDEMIA    Hypertension    Hypertension    IRRITABLE BOWEL SYNDROME, HX OF    Menopausal state 01/2012   [facility] = 88.5   Mitral valve prolapse 12/17/2000   a. dx 1980s, most recent echo did not demonstrate this.   MYALGIA    Normal coronary arteries    a. by cardiac CT 2013.   PAT (paroxysmal atrial tachycardia) (HCC)    PE (pulmonary embolism) 02/09/2015   a. Bilateral PEs 01/2015 when d-dimer 0.69, CP lying on left side. Followed by heme-onc - + lupus anticoagulant, mildly depressed protein S. Dr. Myna Hidalgo is not certain if her hypercoagulable studies are significant for a thrombophilic state.   Premature atrial contractions    Pulmonary embolus (HCC) 02/10/2015   Shortness of breath    Pulmonary eval 05/2002   Thyroid nodule 2014   Tremors of nervous system     Past Surgical History:  Procedure Laterality Date   COLONOSCOPY     LAPAROSCOPIC APPENDECTOMY N/A 03/17/2017   Procedure: APPENDECTOMY LAPAROSCOPIC;  Surgeon: Glenna Fellows, MD;  Location: [facility]S;  Service: General;  Laterality: N/A;   Lipoma (R) side  1990   Right back    Medications: Current Outpatient Medications   Medication Sig Dispense Refill   ALPRAZolam (XANAX) 0.5 MG tablet Take 1 tablet (0.5 mg total) by mouth 2 (two) times daily as needed. 30 tablet 0   aspirin EC 325 MG tablet Take 325 mg by mouth daily.     clobetasol ointment (TEMOVATE) 0.05 % Apply topically to nail, proximal nail fold daily on weekends only. Avoid applying to face, groin, and axilla. Use as directed. 30 g 3   dexlansoprazole (DEXILANT) 60 MG capsule Take 1 capsule (60 mg total) by mouth daily. 90 capsule 1   Dextromethorphan-buPROPion ER (AUVELITY) 45-105 MG TBCR Take 1 tablet by mouth 2 (two) times daily. 180 tablet 0   diltiazem (CARTIA XT) 180 MG 24 hr capsule Take 1 capsule (180 mg total) by mouth daily. 90 capsule 1   fexofenadine (ALLEGRA ALLERGY) 180 MG tablet Take 1 tablet (180 mg total) by mouth daily.     fluticasone (FLONASE)  50 MCG/ACT nasal spray Place 2 sprays into the nose daily as needed. For allergies     gabapentin (NEURONTIN) 600 MG tablet Take 2 tablets (1,200 mg total) by mouth every evening. 180 tablet 1   lidocaine (XYLOCAINE) 5 % ointment Apply a pea sized amount topically 20 minutes prior to intercourse, wipe off just prior to intercourse. 30 g 1   modafinil (PROVIGIL) 200 MG tablet Take 1&1/2 tablets (300 mg total) by mouth in the morning. 135 tablet 0   NONFORMULARY OR COMPOUNDED ITEM sm Azelaic acid/ metronidazole/ivermectin 15%/1%/1% cream apply to face bid 1 each 5   OLANZapine (ZYPREXA) 5 MG tablet Take 1 tablet (5 mg total) by mouth at bedtime. 90 tablet 0   polyethylene glycol (GOLYTELY) 236 g solution Use as directed 4000 mL 0   Probiotic Product (PROBIOTIC DAILY PO) Take 1 tablet by mouth daily.      propranolol (INDERAL) 20 MG tablet Take 1 tablet (20 mg total) by mouth 2 (two) times daily. 180 tablet 0   sertraline (ZOLOFT) 50 MG tablet Take 1.5 tablets (75 mg total) by mouth daily. 135 tablet 0   tretinoin (RETIN-A) 0.025 % cream Apply topically at bedtime. 45 g 11   trimethoprim (TRIMPEX)  100 MG tablet take as directed after intercourse 30 tablet 2   No current facility-administered medications for this visit.    Allergies  Allergen Reactions   Cariprazine Other (See Comments)    Patient becomes suicidal when taking this medication.  Patient becomes suicidal when taking this medication.    Metoprolol Other (See Comments)    Hallucinations hair falls out Hallucinations hair falls out   Nickel Rash   Iron Nausea And Vomiting    Diagnoses:  F33.1  Plan of Care: Recommend ongoing therapy.   Pt participated in setting treatment goals.  Plan to meet monthly.  Pt agrees with treatment plan.  Treatment Plan Client Abilities/Strengths  Pt is bright, engaging, and motivated for therapy.   Client Treatment Preferences  Individual therapy.  Client Statement of Needs  Improve coping skills.  Symptoms  Depressed or irritable mood. Diminished interest in or enjoyment of activities. Lack of energy. Poor concentration and indecisiveness. Social withdrawal.  Problems Addressed  Unipolar Depression Goals 1. Alleviate depressive symptoms and return to previous level of effective functioning. 2. Appropriately grieve the loss in order to normalize mood and to return to previously adaptive level of functioning. Objective Learn and implement behavioral strategies to overcome depression. Target Date: 2023-12-02 Frequency: monthly  Progress: 55 Modality: individual  Related Interventions Engage the client in "behavioral activation," increasing his/her activity level and contact with sources of reward, while identifying processes that inhibit activation.  Use behavioral techniques such as instruction, rehearsal, role-playing, role reversal, as needed, to facilitate activity in the client's daily life; reinforce success. Assist the client in developing skills that increase the likelihood of deriving pleasure from behavioral activation (e.g., assertiveness skills, developing an  exercise plan, less internal/more external focus, increased social involvement); reinforce success. Objective Identify important people in life, past and present, and describe the quality, good and poor, of those relationships. Target Date: 2023-12-02 Frequency: monthly  Progress: 55 Modality: individual  Related Interventions Conduct Interpersonal Therapy beginning with the assessment of the client's "interpersonal inventory" of important past and present relationships; develop a case formulation linking depression to grief, interpersonal role disputes, role transitions, and/or interpersonal deficits). Objective Learn and implement problem-solving and decision-making skills. Target Date: 2023-12-02 Frequency: monthly  Progress: 55 Modality:  individual  Related Interventions Conduct Problem-Solving Therapy using techniques such as psychoeducation, modeling, and role-playing to teach client problem-solving skills (i.e., defining a problem specifically, generating possible solutions, evaluating the pros and cons of each solution, selecting and implementing a plan of action, evaluating the efficacy of the plan, accepting or revising the plan); role-play application of the problem-solving skill to a real life issue. Encourage in the client the development of a positive problem orientation in which problems and solving them are viewed as a natural part of life and not something to be feared, despaired, or avoided. 3. Develop healthy interpersonal relationships that lead to the alleviation and help prevent the relapse of depression. 4. Develop healthy thinking patterns and beliefs about self, others, and the world that lead to the alleviation and help prevent the relapse of depression. 5. Recognize, accept, and cope with feelings of depression. Diagnosis F33.1  Conditions For Discharge Achievement of treatment goals and objectives   Salomon Fick, LCSW

## 2022-12-08 ENCOUNTER — Other Ambulatory Visit (HOSPITAL_COMMUNITY): Payer: Self-pay

## 2022-12-18 ENCOUNTER — Ambulatory Visit: Payer: 59 | Admitting: Psychology

## 2022-12-18 DIAGNOSIS — F331 Major depressive disorder, recurrent, moderate: Secondary | ICD-10-CM

## 2022-12-18 NOTE — Progress Notes (Signed)
[facility] Counselor/Therapist Progress Note  Patient ID: Beverly Cole, MRN: [id number],    Date: 12/18/2022  Time Spent: 3:00pm-3:50pm   50 minutes   Treatment Type: Individual Therapy  Reported Symptoms: stress  Mental Status Exam: Appearance:  Casual     Behavior: Appropriate  Motor: Normal  Speech/Language:  Normal Rate  Affect: Appropriate  Mood: normal  Thought process: normal  Thought content:   WNL  Sensory/Perceptual disturbances:   WNL  Orientation: oriented to person, place, time/date, and situation  Attention: Good  Concentration: Good  Memory: WNL  Fund of knowledge:  Good  Insight:   Good  Judgment:  Good  Impulse Control: Good   Risk Assessment: Danger to Self:  No Self-injurious Behavior: No Danger to Others: No Duty to Warn:no Physical Aggression / Violence:No  Access to Firearms a concern: No  Gang Involvement:No   Subjective: Pt present for face-to-face individual therapy via video.  Pt consents to telehealth video session and is aware of limitations and benefits of virtual sessions.  Location of pt: home Location of therapist: home office.  Pt talked about her cats.   She is very attached to them and gets enjoyment out of them.   Pt talked about her cousin in [place].  Pt states she is 70 and is "weird".   Addressed their interaction. Pt talked about the presidential election.   She is very upset with the outcome.   Addressed pt's thoughts and feelings.   Pt is worried about how the next 4 years will go.   Worked on self care strategies. Provided supportive therapy.    Interventions: Cognitive Behavioral Therapy and Insight-Oriented  Diagnosis:F33.1  Plan of Care: Recommend ongoing therapy.   Pt participated in setting treatment goals.  Plan to meet monthly.  Pt agrees with treatment plan.  Treatment Plan Client Abilities/Strengths  Pt is bright, engaging, and motivated for therapy.   Client Treatment Preferences   Individual therapy.  Client Statement of Needs  Improve coping skills.  Symptoms  Depressed or irritable mood. Diminished interest in or enjoyment of activities. Lack of energy. Poor concentration and indecisiveness. Social withdrawal.  Problems Addressed  Unipolar Depression Goals 1. Alleviate depressive symptoms and return to previous level of effective functioning. 2. Appropriately grieve the loss in order to normalize mood and to return to previously adaptive level of functioning. Objective Learn and implement behavioral strategies to overcome depression. Target Date: 2023-12-02 Frequency: monthly  Progress: 55 Modality: individual  Related Interventions Engage the client in "behavioral activation," increasing his/her activity level and contact with sources of reward, while identifying processes that inhibit activation.  Use behavioral techniques such as instruction, rehearsal, role-playing, role reversal, as needed, to facilitate activity in the client's daily life; reinforce success. Assist the client in developing skills that increase the likelihood of deriving pleasure from behavioral activation (e.g., assertiveness skills, developing an exercise plan, less internal/more external focus, increased social involvement); reinforce success. Objective Identify important people in life, past and present, and describe the quality, good and poor, of those relationships. Target Date: 2023-12-02 Frequency: monthly  Progress: 55 Modality: individual  Related Interventions Conduct Interpersonal Therapy beginning with the assessment of the client's "interpersonal inventory" of important past and present relationships; develop a case formulation linking depression to grief, interpersonal role disputes, role transitions, and/or interpersonal deficits). Objective Learn and implement problem-solving and decision-making skills. Target Date: 2023-12-02 Frequency: monthly  Progress: 55 Modality:  individual  Related Interventions Conduct Problem-Solving Therapy using techniques such as  psychoeducation, modeling, and role-playing to teach client problem-solving skills (i.e., defining a problem specifically, generating possible solutions, evaluating the pros and cons of each solution, selecting and implementing a plan of action, evaluating the efficacy of the plan, accepting or revising the plan); role-play application of the problem-solving skill to a real life issue. Encourage in the client the development of a positive problem orientation in which problems and solving them are viewed as a natural part of life and not something to be feared, despaired, or avoided. 3. Develop healthy interpersonal relationships that lead to the alleviation and help prevent the relapse of depression. 4. Develop healthy thinking patterns and beliefs about self, others, and the world that lead to the alleviation and help prevent the relapse of depression. 5. Recognize, accept, and cope with feelings of depression. Diagnosis F33.1  Conditions For Discharge Achievement of treatment goals and objectives   Salomon Fick, LCSW

## 2022-12-30 ENCOUNTER — Other Ambulatory Visit: Payer: Self-pay | Admitting: Psychiatry

## 2022-12-30 ENCOUNTER — Other Ambulatory Visit: Payer: Self-pay | Admitting: Family Medicine

## 2022-12-30 DIAGNOSIS — K219 Gastro-esophageal reflux disease without esophagitis: Secondary | ICD-10-CM

## 2022-12-30 DIAGNOSIS — R251 Tremor, unspecified: Secondary | ICD-10-CM

## 2022-12-31 ENCOUNTER — Other Ambulatory Visit: Payer: Self-pay

## 2022-12-31 ENCOUNTER — Other Ambulatory Visit (HOSPITAL_COMMUNITY): Payer: Self-pay

## 2022-12-31 MED ORDER — DEXLANSOPRAZOLE 60 MG PO CPDR
1.0000 | DELAYED_RELEASE_CAPSULE | Freq: Every day | ORAL | 1 refills | Status: DC
  Filled 2022-12-31: qty 90, 90d supply, fill #0

## 2023-01-01 ENCOUNTER — Other Ambulatory Visit (HOSPITAL_COMMUNITY): Payer: Self-pay

## 2023-01-01 ENCOUNTER — Other Ambulatory Visit: Payer: Self-pay

## 2023-01-01 MED ORDER — PROPRANOLOL HCL 20 MG PO TABS
20.0000 mg | ORAL_TABLET | Freq: Two times a day (BID) | ORAL | 0 refills | Status: DC
  Filled 2023-01-01: qty 60, 30d supply, fill #0

## 2023-01-13 ENCOUNTER — Other Ambulatory Visit (HOSPITAL_COMMUNITY): Payer: Self-pay

## 2023-01-15 ENCOUNTER — Ambulatory Visit: Payer: 59 | Admitting: Psychology

## 2023-01-15 DIAGNOSIS — F331 Major depressive disorder, recurrent, moderate: Secondary | ICD-10-CM | POA: Diagnosis not present

## 2023-01-15 NOTE — Progress Notes (Signed)
[facility] Counselor/Therapist Progress Note  Patient ID: Beverly Cole, MRN: [id number],    Date: 01/15/2023  Time Spent: 3:00pm-3:50pm   50 minutes   Treatment Type: Individual Therapy  Reported Symptoms: stress  Mental Status Exam: Appearance:  Casual     Behavior: Appropriate  Motor: Normal  Speech/Language:  Normal Rate  Affect: Appropriate  Mood: normal  Thought process: normal  Thought content:   WNL  Sensory/Perceptual disturbances:   WNL  Orientation: oriented to person, place, time/date, and situation  Attention: Good  Concentration: Good  Memory: WNL  Fund of knowledge:  Good  Insight:   Good  Judgment:  Good  Impulse Control: Good   Risk Assessment: Danger to Self:  No Self-injurious Behavior: No Danger to Others: No Duty to Warn:no Physical Aggression / Violence:No  Access to Firearms a concern: No  Gang Involvement:No   Subjective: Pt present for face-to-face individual therapy via video.  Pt consents to telehealth video session and is aware of limitations and benefits of virtual sessions.  Location of pt: home Location of therapist: home office.  Pt talked about her health.  She has restless leg syndrome and is on medication for it and it makes her much too tired.   Pt talked about her cousin having cancer.  Addressed pt's concerns about her cousin.  Pt's brother is also having health issues.  Pt has been worrying about politics and the results of the presidential election.  Helped pt process her thoughts and feelings.  Addressed pt's fears.  Worked on self care strategies. Provided supportive therapy.    Interventions: Cognitive Behavioral Therapy and Insight-Oriented  Diagnosis:F33.1  Plan of Care: Recommend ongoing therapy.   Pt participated in setting treatment goals.  Plan to meet monthly.  Pt agrees with treatment plan.  Treatment Plan Client Abilities/Strengths  Pt is bright, engaging, and motivated for therapy.   Client  Treatment Preferences  Individual therapy.  Client Statement of Needs  Improve coping skills.  Symptoms  Depressed or irritable mood. Diminished interest in or enjoyment of activities. Lack of energy. Poor concentration and indecisiveness. Social withdrawal.  Problems Addressed  Unipolar Depression Goals 1. Alleviate depressive symptoms and return to previous level of effective functioning. 2. Appropriately grieve the loss in order to normalize mood and to return to previously adaptive level of functioning. Objective Learn and implement behavioral strategies to overcome depression. Target Date: 2023-12-02 Frequency: monthly  Progress: 55 Modality: individual  Related Interventions Engage the client in "behavioral activation," increasing his/her activity level and contact with sources of reward, while identifying processes that inhibit activation.  Use behavioral techniques such as instruction, rehearsal, role-playing, role reversal, as needed, to facilitate activity in the client's daily life; reinforce success. Assist the client in developing skills that increase the likelihood of deriving pleasure from behavioral activation (e.g., assertiveness skills, developing an exercise plan, less internal/more external focus, increased social involvement); reinforce success. Objective Identify important people in life, past and present, and describe the quality, good and poor, of those relationships. Target Date: 2023-12-02 Frequency: monthly  Progress: 55 Modality: individual  Related Interventions Conduct Interpersonal Therapy beginning with the assessment of the client's "interpersonal inventory" of important past and present relationships; develop a case formulation linking depression to grief, interpersonal role disputes, role transitions, and/or interpersonal deficits). Objective Learn and implement problem-solving and decision-making skills. Target Date: 2023-12-02 Frequency: monthly   Progress: 55 Modality: individual  Related Interventions Conduct Problem-Solving Therapy using techniques such as psychoeducation, modeling, and role-playing  to teach client problem-solving skills (i.e., defining a problem specifically, generating possible solutions, evaluating the pros and cons of each solution, selecting and implementing a plan of action, evaluating the efficacy of the plan, accepting or revising the plan); role-play application of the problem-solving skill to a real life issue. Encourage in the client the development of a positive problem orientation in which problems and solving them are viewed as a natural part of life and not something to be feared, despaired, or avoided. 3. Develop healthy interpersonal relationships that lead to the alleviation and help prevent the relapse of depression. 4. Develop healthy thinking patterns and beliefs about self, others, and the world that lead to the alleviation and help prevent the relapse of depression. 5. Recognize, accept, and cope with feelings of depression. Diagnosis F33.1  Conditions For Discharge Achievement of treatment goals and objectives   Salomon Fick, LCSW

## 2023-01-20 ENCOUNTER — Encounter: Payer: Self-pay | Admitting: Psychiatry

## 2023-01-20 ENCOUNTER — Telehealth (INDEPENDENT_AMBULATORY_CARE_PROVIDER_SITE_OTHER): Payer: 59 | Admitting: Psychiatry

## 2023-01-20 ENCOUNTER — Other Ambulatory Visit (HOSPITAL_COMMUNITY): Payer: Self-pay

## 2023-01-20 DIAGNOSIS — G250 Essential tremor: Secondary | ICD-10-CM

## 2023-01-20 DIAGNOSIS — F411 Generalized anxiety disorder: Secondary | ICD-10-CM | POA: Diagnosis not present

## 2023-01-20 DIAGNOSIS — G2581 Restless legs syndrome: Secondary | ICD-10-CM | POA: Diagnosis not present

## 2023-01-20 DIAGNOSIS — F331 Major depressive disorder, recurrent, moderate: Secondary | ICD-10-CM | POA: Diagnosis not present

## 2023-01-20 DIAGNOSIS — F4001 Agoraphobia with panic disorder: Secondary | ICD-10-CM | POA: Diagnosis not present

## 2023-01-20 DIAGNOSIS — F4000 Agoraphobia, unspecified: Secondary | ICD-10-CM | POA: Diagnosis not present

## 2023-01-20 DIAGNOSIS — F40240 Claustrophobia: Secondary | ICD-10-CM

## 2023-01-20 MED ORDER — PRAMIPEXOLE DIHYDROCHLORIDE 1 MG PO TABS
ORAL_TABLET | ORAL | 1 refills | Status: DC
  Filled 2023-01-20: qty 30, 30d supply, fill #0
  Filled 2023-02-16: qty 30, 30d supply, fill #1

## 2023-01-20 MED ORDER — AUVELITY 45-105 MG PO TBCR
1.0000 | EXTENDED_RELEASE_TABLET | Freq: Two times a day (BID) | ORAL | 0 refills | Status: DC
  Filled 2023-01-20 – 2023-03-07 (×2): qty 180, 90d supply, fill #0

## 2023-01-20 MED ORDER — MODAFINIL 200 MG PO TABS
300.0000 mg | ORAL_TABLET | Freq: Every morning | ORAL | 0 refills | Status: DC
  Filled 2023-01-20: qty 135, 90d supply, fill #0

## 2023-01-20 MED ORDER — SERTRALINE HCL 50 MG PO TABS
75.0000 mg | ORAL_TABLET | Freq: Every day | ORAL | 0 refills | Status: DC
  Filled 2023-01-20 – 2023-04-14 (×2): qty 135, 90d supply, fill #0

## 2023-01-20 NOTE — Progress Notes (Signed)
Beverly Cole [id number] 1960-12-03 62 y.o.  Video Visit via My Chart  I connected with pt by video using My Chart and verified that I am speaking with the correct person using two identifiers.   I discussed the limitations, risks, security and privacy concerns of performing an evaluation and management service by My Chart  and the availability of in person appointments. I also discussed with the patient that there may be a patient responsible charge related to this service. The patient expressed understanding and agreed to proceed.  I discussed the assessment and treatment plan with the patient. The patient was provided an opportunity to ask questions and all were answered. The patient agreed with the plan and demonstrated an understanding of the instructions.   The patient was advised to call back or seek an in-person evaluation if the symptoms worsen or if the condition fails to improve as anticipated.  I provided 55 minutes of video time during this encounter.  The patient was located at home and the provider was located office. Session [id number]  Subjective:   Patient ID:  Beverly Cole is a 62 y.o. (DOB 06-Aug-1960) female.  Chief Complaint:  Chief Complaint  Patient presents with   Follow-up   Depression   Anxiety   Fatigue    Depression        Associated symptoms include decreased concentration and fatigue.  Associated symptoms include no suicidal ideas.  Past medical history includes anxiety.   Anxiety Symptoms include decreased concentration, dizziness, nervous/anxious behavior and shortness of breath. Patient reports no chest pain, confusion or suicidal ideas.     Beverly Cole  today for follow-up of chronic depression and anxiety.  Lately depression worse than anxiety.  Seen with husband today  When seen May 11, 2018.  For persistent anxiety we elected to retry buspirone and try increasing it to 30 mg twice daily if tolerated.   Couldn't tolerate it this time DT  muscle spasms.  Pharmacy called saying she could have serotonin syndrome.  When seen August 09, 2018 and she refused med changes despite chronic anxiety and depression. She remained on sertraline 50, Depakote ER 1000 mg, Wellbutrin SR 200 mg AM  seen December 09, 2018.  Because of chronic depression and dysfunction with fatigue and poor motivation we decided off label trial  trial of modafinil 200 mg 1/2 each am for 1 week then 200 mg each AM. Did see benefit from it.  More energy and working to stay out of bed more.    seen January 10, 2019.  The modafinil had been helpful.  No meds were changed.  seen April 07, 2019.  The following was noted: Still sees benefit from modafinil but still not caring for herself like she should.  Not wanting to go anywhere.  Not driven since early fall.  Hard to breathe with the mask and it creates anxiety.  Hard to be in enclosed spaces like cars and planes for extended periods.   Trying to set her alarm clock.  No hallucinations.  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  Plans to send more too.  Still follow through is not great.  Still depression and productivity is still poor but it is not as poor as it was before modafinil. Plan increase modafinil to 300 mg daily to see if function can be improved.  06/29/2019 appointment, the following is noted: Rare Xanax.  Did  increase modafinil to 300 mg daily. Saw benefit with the increase with less time in bed but still markedly functionally impaired.   It seems like I get used to it and then doesn't maintain her get up and go.  Still having a hard time with self care like hygiene. Chronic low motivation and lack of interest is unchanged.   Gall bladder problems.  It's better at the moment.    Not more sad, just like I've always been.  Can't make herself leave the house DT depression and anxiety.  She won't do chores.   Less excess sleeping.  He works  from home 2 days weekly.  No periods of hyperactivity or manic sx since the spring. Attending therapy every 2 weeks.   Can be confused when first wakes up regardless of time of day. Had episode of hallucination in the middle of the night when awakened. Scarecrow frightened her. This is rare event.  No hallucinations during the day.  Melatonin makes it worse. Has had fearful thoughts of planes crashing or bad things happening to others and it might be her fault.  Fight with brother lately and not speaking with him now. Plan:  No med changes  10/11/19 appt with the following noted: Unsteady and tremors gotten worse.  Not gone to doctor. Propranolol not helping as much.   On Depakote ER 1000mg  HS which is a reduction. So tired all the time and H says she's not doing anything.  H says accomplishing littte and still lays in bed a lot.  H says it's 2-3 PM before she gets OOB.  Memory is poor.  Everything is such a chore and doesn't want to do things. H says she starts things she doesn't finish.  Lots of new projects.   Increase modafinil on her own to 400 mg daily in the AM.  Anxious.  Rare Xanax.  Was happy when got new cats in the summer.  Anxious and Depressed and describes anxiety as Moderate. Anxiety symptoms include: Excessive Worry,.  She thinks sertraline reduces her obsessiveness.  Past hx panic so avoidant. Never get to relax.  Pt reports sleeps excessively even with modafinil. Pt reports that appetite is good. Pt reports that energy is poor and loss of interest or pleasure in usual activities, poor motivation and withdrawn from usual activities. Just don't care about things and admits to a lot of general anxiety and everything takes a lot of energy out of her.  Compulsively tapes and watches TV shows, news and awards shows.  Can't delete it bc I'll miss something.  Watches TV in bed. Concentration is poor. Suicidal thoughts:  denied by patient. But chronic death thoughts.  Poor productivity  overall.  Chronic poor self care, showers only weekly.  Just don't care.  Chronic disability.  She thinks sertraline helped the anxiety some.  Likes that H is at home.  Helps her mood.  Doesn't care about going out. Feels faint when she tries to wear a mask.  Too anxious driving and is avoidant. Collects TV shows, she can't erase unless she watches entirely. Hoards some bc afraid she might need it some day.   Rare use of Xanax bc fears addiction. Plan: Attempted referral to neurology at [facility] neurology.  They refused to see patient stating they had nothing to offer her. It was suggested to patient that she try to get in with Dr. Anne Hahn whom she had seen in the past.  01/10/2020 appointment with the following noted: Reduced modafinil  to 300 mg bc didn't want to get hooked on anything about 2-3 weeks.  Did initially see benefit from the modafinil but seemed to get tolerant to it. Feels better not talking to brother bc differing views of politics.  Feels he's a negative person but never depressed. Overall thinks she's doing pretty well relatively.  However is chronically depressed and never happy.  Nothing changed with the meds. Depression worse than anxiety. H didn't think modafinil made much difference.  H CO her inactivity.. She felt it was helpful for energy initially. Had to cancel trips bc can't wear a mask for that long.  Makes her sad. Driven twice in a year or so DT anxiety. Wants prn Xanax. Plan: DC sertraline Start fluoxetine 20 mg daily with olanzapine 5 mg daily.  05/22/2020 appointment with following noted: Didn't remember to stop sertraline and took fluoxetine and olanzapine for 2 days and couldn't eat and stopped it. Then went to dermatologist December who told her couldn't take Zoloft with fungus med and stopped sertraline. Then became more depressed.  I have to go back on the Zoloft. Remained on modafinil, Wellbutrin, Depakote.  Even less motivated to do anything like cook,  clean.  Won't go to [employer] etc. Chronic depression with hopelessness to get better Plan: No med changes  07/31/2020 appointment with the following noted: Back on sertraline at 75 mg daily with Remained on modafinil, Wellbutrin, Depakote.  Aunt died and one of worst fears but not thinking about it. Family got Covid but she recovered.  She thought it might kill and didn't care. Tremors are getting worse and balance problems. Went to concert and that was a problem.  Enjoyed the concert. Also saw Athena Masse.  Music always important to her.  1980 had planned to commit suicide listening to an album over and over but didn't attempt. B and M have familial tremor also. Still anxious and depressed and haven't driven in a year DT anxiety. Plan: Option of trial olanzapine alone without fluoxetine.  Rec retry.  She agrees.  10/23/2020 appointment with the following noted: "Olanzapine is really working."  Initial SE drunk at 5 mg HS so cut it in half.  Got used to it but I feel happy and more relaxed and easier time going to sleep.  If I could still chose to die I would but clearly happier but not more hopeful.  Laughing more.  No SI. Needs to care about things more.  Chronic lack of motivations. Still doesn't want to leave the house but did enjoy a concert in [place]. Chronic unsteadiness is worse.  Last disc with doctor in March who rec PT but she never did the PT.  Misses aunt who died.   She reduced the Depakote to 500 mg HS. When stopped sertraline was not doing well and restarted it bc stressed and unhappy.  Seems to be a key med for me. Pending card test soon.  Feels better than in the past. RLS is worse after starting olanzapine and even affecting arms but had it before.  Happens after lays down at night.  Takes MG which helps.   Xanax about once every 3 weeks. Plan: Successful trial olanzapine 2.5 mg with sertraline 50 but worse RLS Trial gabapentin for RLS 100-300 mg PM and disc SE.   Higher if needed..  Once controlled consider increase olanzapine.  01/23/2021 appointment with the following noted: Taking gabapentin 300 mg but needs to go higher for RLS.  SE dizzy and reduced  concentration. Reads on internet.   Increased olanzapine to 5 mg HS a couple of mos ago and feels better with it.  Happier.  More relaxed.  Only think that's helped in a long time. Handwriting is so much better, less shakey. Has RLS and can be bad when she lays down. Got a new kitten and enjoys it.  Now has 4 cats. Afraid to try new meds after negative reaction to Vraylar.  Afraid of any med change.  Afraid of having SI and afraid she'd do it if had SI.   Saw pulmonologist over weakness and he says she's OK except deconditioned.  Can't tolerate wearing the masks. Plan: Successful trial olanzapine 5 mg with sertraline 50 for depression and anxiety but worse RLS Consider increasing the olanzapine later if RLS can be managed. increase gabapentin for RLS 400-600 mg PM and disc SE.  Higher if needed..  Once controlled consider increase olanzapine.  03/28/2021 appointment with the following noted: Uses Ivory soap behind knee with benefit.  Increased gabapentin to 600 mg PM.  Dizziness resolved which occurred when turned over in bed.  Not a problem. RLS still 5/7 days.  But had no vacation RLS bc was walking more.  Not active at home. Sleep is great 8-9 hours and best in a long time. Enjoyed expensive vacation to [facility].  Once in a lifetime thing.  Some things she didn't like for example the cost. Went to [employer] concert and others. Depression is better.  Olanzapine really helped.  Didn't expect it.   Hasn't felt this good in years and H notices.   Caffeine 2 cups hot tea and limited. Plan: Successful trial olanzapine 5 mg with sertraline 50 for depression and anxiety but worse RLS Consider increasing the olanzapine later if RLS can be managed. increase gabapentin for ok to increase RLS 600-900 mg PM and  disc SE.  Higher if needed..  Once controlled consider increase olanzapine.  05/27/2021 phone call wanting to increase olanzapine to see if it will further help depression.  She had increased it on her own to 10 mg for a couple of weeks.  She has to increase to 15 mg.  This was agreed  06/26/21 appt noted: On sertraline 50 and olanzapine 15 mg HS with 1200 mg PM gabapentin for RLS RLS kicks in about 4 hours after olanzapine.  Prefers to stay up late bc doesn't really want to go to sleep.  RLS 5 days per week. Sleep 8 hours. Caffeine 2 cups hot tea and limited. Benefit from increase olanzapine. Helped anxiety and able to go to sleep earlier than usual.   Before trip was happier than I've been in years.  Not real happy with trip and now nothing to look forward to.   SE some dizziness with gabapentin.  But it's better.   Mood still better than before olanzapine. Plan: benefit olanzapine 15 mg with sertraline 50 for depression and anxiety but worse RLS She wants to Consider increasing the olanzapine later if RLS can be managed. OK increase olanzapine to 20 mg PM If RLS is worse then will have to reduce.  08/26/2021 appointment with the following noted: Increased olanzapine 20 mg HS.  Not sure if it made a difference.  Terrible time with memorfy ongoing. Sleep good.  Still has RLS and can get pain in her arm including yesterday at 6-7 PM.   Asks about Ozempic for OCD. Depends on H [name]. Anxiety is better and that helps sleep.  Depression  better than it was.  No longer thinks of death all the time like she use to.  Still not very active.  Need something to look forward to. Stays up late and likes doing so bc less depressed in evening.   Plan continue olanzapine 20 mg daily with sertraline 75 mg daily  11/26/2021 appointment noted:  seen with Al Decant Memory is poor.  Forgets TV shows. Taking alprazolam rarely. Overall thinks she is doing fine.  Still not driving much.  Don't like to leave the  house but went outside on the deck some which is improvement. Sleeps 12 hours nightly but then up all day. Happier with olanazapine and thinks the higher doses did help the anxiety. H doesn't see any changes in the last year.  Covid messed her up and she has become housebound.  Not social.  She got used to being inside and no motivation to change it. H cleans and prepares meals.  Doesn't have motivation for chores. Plan: continue sertraline 50 for depression and anxiety  She wants to Consider increasing the olanzapine later if RLS can be managed. Reduce olanzapine to 10 mg HS bc unclear if higher dose helped more than lower. RLS is better with MG Modafinil 300 to 400 mg daily for alertness and off label for treatment resistant depression Trial Auvelity for TRD 1 in AM for 1 week, then 1 twice daily. DC Wellbutriin  12/25/2021 phone call requesting 90-day prescription of Auvelity stating that it was working well.  02/19/22 appt noted: Current psych med: rare Xanax, Auvelity BID, no fluoxetine, modafinil 300 mg AM , olanzapine 10 pm, propranolol 20 BID, sertraline 75 mg daily. Gabapentin 1200 mg daily I feel like doing things more and I see things more clearly.  Realizes now buying things to make myself feel happier and not really a buying problem per se.  Now not driven to buy things she shouldn't.   Feels embarrassed by what she did before with this.  Had bought a lot of expensive baskets and now no longer does that.  She is happier she's better and H notices it too.  Putting things away and better cleaning.  Tired a lot.  Better motivation..  had lunch with friends scheduled for Friday but is cancelling.  Not sure whay she is avoidant socially.  Anxious about it.  Almost fears a panic attack.  Not sure when had last panic but had them when she tried to go to work.  I can't.  Doesn't drive often.  Has a Zenaida Niece but doesn't like driving it.  Can't drive at night.   Still sleeps 12 hours in 24.   Happy  the way I am right now. Some trouble with restless legs. Plan: no med changes Less depressed with Auvelity for TRD BID markedly  04/23/22 appt noted: "Haven't felt this good in years." Consistent with Auvelity BID.   Current psych meds: modafinil 300 Am, Auvelity BID, sertraline 75, olanzapine 10 mg HS, propranolol 20 BID. Asks about going off olanzapine. 2 cats put to sleep and she did well with it despite in past had SI when doing it. Still sleeps 12 hour daily bc so tired and wants to try stopping it. No RLS on gabapentin.  To [place] next month.   Plan: continue sertraline 75 for depression and anxiety  Less depressed with Auvelity for TRD BID markedly, best in years. Ok trial reduction to olanzapine to 5  mg HS .  After 30 D if well can  stop it. RLS is better with MG  06/24/22 appt noted: Not often with alprazolam other than sleep if needed.   Psych med: Auvelity BID , gabapentin 1200 pm for back pain, modafinil 300 AM, olanzapine 5 mg HS, sertraline 75 mg daily, propranolol 20 BID, alprazolam 0.5 mg prn When reduced olanzapine from 10 to 5 then not as happy. But is less sleepy and tired and wants to stay on the lower dose for now. Overall still doing good and "very good".  Hurt back packing for [place] and still some back problems.  Went to [place].  Had a good time there.  Bad weather cancelled the snorkeling trip.   Overall satisfied with current problems.  Plan no med changes  11/13/22 appt noted: Psych med: Auvelity BID, gabapentin 600 pm for back pain & RLS, modafinil 300 AM, olanzapine 5 mg HS, sertraline 75 mg daily, propranolol 20 BID, alprazolam 0.5 mg prn Stress cousin bone CA really upsetting.  B needing neck surgery.  H prostate bx pending. CBD oil in drink helps her anxiety.  Just relaxes her and doesn't drive after  it or take it excessively.   Memory is not good.  Can forget what she says in conversation.  Took memory test at PCP and it was normal.  This is not new.    She feels she has maintained the benefit from [place].  Now I don't want to die which is unbelievable bc has wanted to die for 50 years.  Now just starting life again.  But now is worrying about dying.  Both parents had CA and B had CA. Cont problems with RLS.  Will occur in arms as well.  Reversed sleep schedule.  Stay up all night.  Sleep 8-10 hours in day.   Still using caffeine. Plan no changes  01/20/23 appt noted: Meds as above except Reduced gabapentin to 900 mg pm bc 1200 mg made her sleep 12 hours. Ongoing problems with RLS and not controlled.  It is at night when lays down in legs and arms when it is bad.  RLS for years.   Doubt gabapentin is helping back pain.  No other SE. Mood is "good".  Maintained benefit. Anxiety is under control for her. Problems going to sleep.  Once asleep sleeps 9 hours.      Past psych med trials:  Wellbutrin XL 300, Sertraline 75, fluoxetine SE brief ,  Auvelity marked positive response  Vraylar SI, Latuda, Abilify , olanzapine 20 Symbyax side effects Seroquel which caused side effects of constipation,  Depakote ER 1500mg  HS  lamotrigine,  refuses lithium because of altered taste.    Pramipexole 1 mg in 2019? 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   buspirone SE hallucinations Gabapentin for RLS  ECT 2016 without help,   Review of Systems:  Review of Systems  Constitutional:  Positive for fatigue.  Respiratory:  Positive for shortness of breath.   Cardiovascular:  Negative for chest pain.  Gastrointestinal:  Negative for abdominal pain and vomiting.  Musculoskeletal:  Positive for gait problem.  Neurological:  Positive for dizziness and tremors. Negative for weakness.       RLS  Psychiatric/Behavioral:  Positive for decreased concentration and sleep disturbance. Negative for agitation, behavioral problems, confusion, dysphoric  mood, hallucinations, self-injury and suicidal ideas. The patient is nervous/anxious. The patient is not hyperactive.     Medications:  I have reviewed the patient's current medications.  Current Outpatient Medications  Medication Sig Dispense Refill   ALPRAZolam (XANAX) 0.5 MG tablet Take 1 tablet (0.5 mg total) by mouth 2 (two) times daily as needed. 30 tablet 0   aspirin EC 325 MG tablet Take 325 mg by mouth daily.     clobetasol ointment (TEMOVATE) 0.05 % Apply topically to nail, proximal nail fold daily on weekends only. Avoid applying to face, groin, and axilla. Use as directed. 30 g 3   dexlansoprazole (DEXILANT) 60 MG capsule Take 1 capsule (60 mg total) by mouth daily. 90 capsule 1   diltiazem (CARTIA XT) 180 MG 24 hr capsule Take 1 capsule (180 mg total) by mouth daily. 90 capsule 1   fexofenadine (ALLEGRA ALLERGY) 180 MG tablet Take 1 tablet (180 mg total) by mouth daily.     fluticasone (FLONASE) 50 MCG/ACT nasal spray Place 2 sprays into the nose daily as needed. For allergies     lidocaine (XYLOCAINE) 5 % ointment Apply a pea sized amount topically 20 minutes prior to intercourse, wipe off just prior to intercourse. 30 g 1   NONFORMULARY OR COMPOUNDED ITEM sm Azelaic acid/ metronidazole/ivermectin 15%/1%/1% cream apply to face bid 1 each 5   OLANZapine (ZYPREXA) 5 MG tablet Take 1 tablet (5 mg total) by mouth at bedtime. 90 tablet 0   pramipexole (MIRAPEX) 1 MG tablet 1/2 tablet 90 min before sleep for 1 week and if restless legs persists increase to 1 tablet 90 mins before sleep 30 tablet 1   Probiotic Product (PROBIOTIC DAILY PO) Take 1 tablet by mouth daily.      propranolol (INDERAL) 20 MG tablet Take 1 tablet (20 mg total) by mouth 2 (two) times daily. 60 tablet 0   tretinoin (RETIN-A) 0.025 % cream Apply topically at bedtime. 45 g 11   trimethoprim (TRIMPEX) 100 MG tablet take as directed after intercourse 30 tablet 2   Dextromethorphan-buPROPion ER (AUVELITY) 45-105 MG TBCR  Take 1 tablet by mouth 2 (two) times daily. 180 tablet 0   modafinil (PROVIGIL) 200 MG tablet Take 1&1/2 tablets (300 mg total) by mouth in the morning. 135 tablet 0   polyethylene glycol (GOLYTELY) 236 g solution Use as directed 4000 mL 0   sertraline (ZOLOFT) 50 MG tablet Take 1&1/2 tablets (75 mg total) by mouth daily. 135 tablet 0   No current facility-administered medications for this visit.    Medication Side Effects: None  Allergies:  Allergies  Allergen Reactions   Cariprazine Other (See Comments)    Patient becomes suicidal when taking this medication.  Patient becomes suicidal when taking this medication.    Metoprolol Other (See Comments)    Hallucinations hair falls out Hallucinations hair falls out   Nickel Rash   Iron Nausea And Vomiting    Past Medical History:  Diagnosis Date   Anxiety    Atrial fibrillation (HCC)    a. Event monitor 2013 - PACs/bradycardia/SVT/short run of atrial fib by event monitor.    BIPOLAR DISORDER UNSPECIFIED    Bradycardia    Bursitis of hip 09/1999   Bilateral - Dr. Luiz Blare   DEPRESSION    Emphysema lung (HCC) 06/20/2016   pt states pulmonalongist stated started recently   GERD    Headache(784.0)    was with menstrual cycle. no longer a problem   HYPERLIPIDEMIA    Hypertension    Hypertension    IRRITABLE BOWEL SYNDROME, HX OF    Menopausal state 01/2012  FSH = 88.5   Mitral valve prolapse 12/17/2000   a. dx 1980s, most recent echo did not demonstrate this.   MYALGIA    Normal coronary arteries    a. by cardiac CT 2013.   PAT (paroxysmal atrial tachycardia) (HCC)    PE (pulmonary embolism) 02/09/2015   a. Bilateral PEs 01/2015 when d-dimer 0.69, CP lying on left side. Followed by heme-onc - + lupus anticoagulant, mildly depressed protein S. Dr. Myna Hidalgo is not certain if her hypercoagulable studies are significant for a thrombophilic state.   Premature atrial contractions    Pulmonary embolus (HCC) 02/10/2015   Shortness  of breath    Pulmonary eval 05/2002   Thyroid nodule 2014   Tremors of nervous system     Family History  Problem Relation Age of Onset   Lung cancer Mother    Hypertension Mother    Hyperlipidemia Mother    Cancer Father        Esophageal cancer   Hyperlipidemia Father    Depression Father    Cancer Brother    Pulmonary embolism Brother    Colon cancer Paternal Uncle    Sarcoidosis Other    Testicular cancer Other     Social History   Socioeconomic History   Marital status: Married    Spouse name: Not on file   Number of children: 0   Years of education: Not on file   Highest education level: Associate degree: academic program  Occupational History   Occupation: Nurse-Personal Care/HH    Employer: UNEMPLOYED  Tobacco Use   Smoking status: Former    Current packs/day: 0.00    Average packs/day: 1 pack/day for 25.0 years (25.0 ttl pk-yrs)    Types: Cigarettes    Start date: 02/11/1971    Quit date: 02/11/1996    Years since quitting: 26.9   Smokeless tobacco: Never   Tobacco comments:    Married, lives with spouse. 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       No exercise   Social Determinants of Health   Financial Resource Strain: Low Risk  (06/20/2022)   Overall Financial Resource Strain (CARDIA)    Difficulty of Paying Living Expenses: Not hard at all  Food Insecurity: No Food Insecurity (06/20/2022)   Hunger Vital Sign    Worried About Running Out of Food in the Last Year: Never true    Ran Out of Food in the Last Year: Never true  Transportation Needs: No Transportation Needs (06/20/2022)   PRAPARE - Administrator, Civil Service  (Medical): No    Lack of Transportation (Non-Medical): No  Physical Activity: Unknown (06/20/2022)   Exercise Vital Sign    Days of Exercise per Week: 0 days    Minutes of Exercise per Session: Not on file  Stress: Stress Concern Present (06/20/2022)   [employer] of Occupational Health - Occupational Stress Questionnaire    Feeling of Stress : Very much  Social Connections: Socially Isolated (06/20/2022)   Social Connection and Isolation Panel [NHANES]    Frequency of Communication with Friends and Family: Never  Frequency of Social Gatherings with Friends and Family: Never    Attends Religious Services: Never    Database administrator or Organizations: No    Attends Engineer, structural: Not on file    Marital Status: Married  Catering manager Violence: Not on file    Past Medical History, Surgical history, Social history, and Family history were reviewed and updated as appropriate.   Please see review of systems for further details on the patient's review from today.   Objective:   Physical Exam:  LMP 05/11/2012   Physical Exam Neurological:     Mental Status: She is alert and oriented to person, place, and time.     Cranial Nerves: No dysarthria.  Psychiatric:        Attention and Perception: Attention and perception normal.        Mood and Affect: Mood is anxious. Mood is not depressed. Affect is not tearful.        Speech: Speech normal.        Behavior: Behavior is cooperative.        Thought Content: Thought content normal. Thought content is not paranoid or delusional. Thought content does not include homicidal or suicidal ideation. Thought content does not include suicidal plan.        Cognition and Memory: Cognition and memory normal.     Comments: Insight and judgment fair and less impulsive Chronic anxiety and avoidant Best mood in years.  But sad over cancer in family and worry over H Still better with meds.     Lab Review:     Component  Value Date/Time   NA 142 04/29/2022 1341   NA 145 (H) 04/05/2020 1508   NA 142 01/21/2017 1013   NA 143 01/16/2016 1005   K 4.2 04/29/2022 1341   K 3.2 (L) 01/21/2017 1013   K 3.3 (L) 01/16/2016 1005   CL 107 04/29/2022 1341   CL 106 01/21/2017 1013   CO2 24 04/29/2022 1341   CO2 29 01/21/2017 1013   CO2 20 (L) 01/16/2016 1005   GLUCOSE 116 (H) 04/29/2022 1341   GLUCOSE 95 01/21/2017 1013   BUN 14 04/29/2022 1341   BUN 14 04/05/2020 1508   BUN 9 01/21/2017 1013   BUN 12.6 01/16/2016 1005   CREATININE 0.84 04/29/2022 1341   CREATININE 0.94 12/20/2019 1449   CREATININE 0.9 01/21/2017 1013   CREATININE 0.9 01/16/2016 1005   CALCIUM 9.1 04/29/2022 1341   CALCIUM 9.2 01/21/2017 1013   CALCIUM 9.5 01/16/2016 1005   PROT 7.2 04/29/2022 1341   PROT 7.2 04/05/2020 1508   PROT 6.9 01/21/2017 1013   PROT 7.1 01/16/2016 1005   ALBUMIN 4.2 04/29/2022 1341   ALBUMIN 4.6 04/05/2020 1508   ALBUMIN 3.6 01/16/2016 1005   AST 18 04/29/2022 1341   AST 13 (L) 12/20/2019 1449   AST 16 01/16/2016 1005   ALT 17 04/29/2022 1341   ALT 15 12/20/2019 1449   ALT 22 01/21/2017 1013   ALT 24 01/16/2016 1005   ALKPHOS 94 04/29/2022 1341   ALKPHOS 62 01/21/2017 1013   ALKPHOS 81 01/16/2016 1005   BILITOT 0.2 04/29/2022 1341   BILITOT <0.2 04/05/2020 1508   BILITOT 0.3 12/20/2019 1449   BILITOT 0.59 01/16/2016 1005   GFRNONAA 76 04/05/2020 1508   GFRNONAA >60 12/20/2019 1449   GFRAA 87 04/05/2020 1508   GFRAA >60 12/03/2018 1346       Component Value Date/Time   WBC 9.6 04/29/2022  1341   RBC 4.90 04/29/2022 1341   HGB 12.3 04/29/2022 1341   HGB 13.7 04/05/2020 1508   HGB 12.8 01/21/2017 1013   HCT 38.1 04/29/2022 1341   HCT 41.4 04/05/2020 1508   HCT 38.2 01/21/2017 1013   PLT 380.0 04/29/2022 1341   PLT 289 04/05/2020 1508   MCV 77.7 (L) 04/29/2022 1341   MCV 85 04/05/2020 1508   MCV 90 01/21/2017 1013   MCH 28.0 04/05/2020 1508   MCH 28.9 12/20/2019 1449   MCHC 32.2 04/29/2022  1341   RDW 17.8 (H) 04/29/2022 1341   RDW 15.0 04/05/2020 1508   RDW 14.5 01/21/2017 1013   LYMPHSABS 2.8 04/29/2022 1341   LYMPHSABS 2.5 04/05/2020 1508   LYMPHSABS 3.6 (H) 01/21/2017 1013   MONOABS 0.5 04/29/2022 1341   EOSABS 0.2 04/29/2022 1341   EOSABS 0.1 04/05/2020 1508   EOSABS 0.5 01/21/2017 1013   BASOSABS 0.1 04/29/2022 1341   BASOSABS 0.1 04/05/2020 1508   BASOSABS 0.0 01/21/2017 1013    No results found for: "POCLITH", "LITHIUM"   Lab Results  Component Value Date   PHENYTOIN <0.5 ug/mL (L) 11/05/2006   VALPROATE 73.7 11/25/2016     .res Assessment: Plan:    Beverly Cole was seen today for follow-up, depression, anxiety and fatigue.  Diagnoses and all orders for this visit:  Major depressive disorder, recurrent episode, moderate (HCC) -     Dextromethorphan-buPROPion ER (AUVELITY) 45-105 MG TBCR; Take 1 tablet by mouth 2 (two) times daily. -     modafinil (PROVIGIL) 200 MG tablet; Take 1&1/2 tablets (300 mg total) by mouth in the morning. -     sertraline (ZOLOFT) 50 MG tablet; Take 1&1/2 tablets (75 mg total) by mouth daily.  Generalized anxiety disorder -     sertraline (ZOLOFT) 50 MG tablet; Take 1&1/2 tablets (75 mg total) by mouth daily.  Panic disorder with agoraphobia -     sertraline (ZOLOFT) 50 MG tablet; Take 1&1/2 tablets (75 mg total) by mouth daily.  Claustrophobia  Uncontrolled restless legs syndrome -     pramipexole (MIRAPEX) 1 MG tablet; 1/2 tablet 90 min before sleep for 1 week and if restless legs persists increase to 1 tablet 90 mins before sleep  Benign essential tremor  Agoraphobia      TRD.  Lifelong history of chronic anhedonic depression and anxiety with very poor functioning . She is highly dysfunctional historically but much better with Auvelity.  She is not currently manic.  She is tolerating the medications.  She has had multiple medication failures as noted above.  Extremely complicated patient.  It is not entirely clear to  me as towhether she truly has bipolar depression or TR major depression.  She has never been manic while under my care.  She is not worse with less mood stabilizer.  Disc risk mania if she is actually bipolar.  She doesn't want more mood stabilizer trials.  Failed all FDA approved bipolar depression & TRD meds except Symbyax.  Couldn't tolerate 2 days of Symbyax However her depression and treatment resistant anxiety were improved with olanzapine and sertraline in combination. Best response ever with current med regimen including with Auvelity addition.     continue sertraline 75 for depression and anxiety  Less depressed with Auvelity for TRD BID markedly, best in years.  RLS is unmanaged agin.  Caffeine can worsen RLS.  Disc timing in relation to olanzapine.  Discussed potential metabolic side effects associated with atypical antipsychotics, as well as  potential risk for movement side effects. Advised pt to contact office if movement side effects occur.  For tremor is considering increasing the propranolol continue 20 BID She'll check with cardiologist on this.  Push fluids bc so much Time in bed and past history of dehydration. Disc risk propranolol but it helps tremor.  Poor sleep hygiene.  Improved with modafinil and motivation is better with it  Modafinil would be likely safer than traditional stimulants which could cause psychotic sx.  Discussed side effects.  She has had none.   Continue other meds. Dont' increase further, Benefit modafinil.  Okay to continue 300-400 mg daily  Continue Alprazolam 0.5 mg twice daily as needed,  Auvelity twice daily, ,  modafinil 300-400 every morning,  sertraline 75 mg daily and consider weaning propranolol 20 mg twice daily for tremor is controlled  Start pramipexole 1/2 tablet 90 min before sleep for 1 week then if RLS is not better then increase to 1 tablet in evening Reduce olanzapine to 1/2 of 5 mg tablet to reduce risk RLS.   Disc risk mania.   Call if develop an of those sx as these changes could trigger mania but RLS is unmanageable at this time. Wean gabapentin to 600 mg for 1 week then reduce to 1/2 tablet nightly for 1 week and then stop it DT poor resp for RLS and back pain  FU 8 weeks  Meredith Staggers, MD, DFAPA   Please see After Visit Summary for patient specific instructions.  Future Appointments  Date Time Provider Department Center  02/17/2023  3:00 PM Bauert, Rella Larve, LCSW [facility] None  03/19/2023  3:00 PM Bauert, Rella Larve, LCSW [facility] None  03/24/2023  3:00 PM Cottle, Steva Ready., MD [facility] None  04/30/2023  3:00 PM Donato Schultz, DO [facility]  08/19/2023  4:00 PM Terri Piedra, DO [facility] None    No orders of the defined types were placed in this encounter.     -------------------------------

## 2023-01-21 ENCOUNTER — Other Ambulatory Visit: Payer: Self-pay

## 2023-01-22 ENCOUNTER — Encounter: Payer: 59 | Admitting: Dermatology

## 2023-01-28 ENCOUNTER — Other Ambulatory Visit: Payer: Self-pay | Admitting: Family Medicine

## 2023-01-28 ENCOUNTER — Other Ambulatory Visit: Payer: Self-pay | Admitting: Psychiatry

## 2023-01-28 ENCOUNTER — Other Ambulatory Visit (HOSPITAL_COMMUNITY): Payer: Self-pay

## 2023-01-28 ENCOUNTER — Other Ambulatory Visit: Payer: Self-pay

## 2023-01-28 DIAGNOSIS — R251 Tremor, unspecified: Secondary | ICD-10-CM

## 2023-01-28 DIAGNOSIS — N952 Postmenopausal atrophic vaginitis: Secondary | ICD-10-CM

## 2023-01-28 MED ORDER — PROPRANOLOL HCL 20 MG PO TABS
20.0000 mg | ORAL_TABLET | Freq: Two times a day (BID) | ORAL | 1 refills | Status: DC
  Filled 2023-01-28: qty 60, 30d supply, fill #0
  Filled 2023-03-03: qty 60, 30d supply, fill #1

## 2023-01-28 MED ORDER — TRIMETHOPRIM 100 MG PO TABS
100.0000 mg | ORAL_TABLET | ORAL | 2 refills | Status: DC
  Filled 2023-01-28: qty 30, 30d supply, fill #0
  Filled 2023-03-30: qty 30, 30d supply, fill #1
  Filled 2023-08-29: qty 30, 30d supply, fill #2

## 2023-02-16 ENCOUNTER — Encounter: Payer: Self-pay | Admitting: Family Medicine

## 2023-02-17 ENCOUNTER — Other Ambulatory Visit: Payer: Self-pay

## 2023-02-17 ENCOUNTER — Other Ambulatory Visit: Payer: Self-pay | Admitting: Family Medicine

## 2023-02-17 ENCOUNTER — Ambulatory Visit: Payer: 59 | Admitting: Psychology

## 2023-02-17 ENCOUNTER — Other Ambulatory Visit (HOSPITAL_COMMUNITY): Payer: Self-pay

## 2023-02-17 DIAGNOSIS — L719 Rosacea, unspecified: Secondary | ICD-10-CM

## 2023-02-17 DIAGNOSIS — F331 Major depressive disorder, recurrent, moderate: Secondary | ICD-10-CM | POA: Diagnosis not present

## 2023-02-17 MED ORDER — NONFORMULARY OR COMPOUNDED ITEM: 5 refills | Status: AC

## 2023-02-17 NOTE — Progress Notes (Signed)
 [facility] Counselor/Therapist Progress Note  Patient ID: Beverly Cole, MRN: [id number],    Date: 02/17/2023  Time Spent: 3:00pm-3:50pm   50 minutes   Treatment Type: Individual Therapy  Reported Symptoms: stress  Mental Status Exam: Appearance:  Casual     Behavior: Appropriate  Motor: Normal  Speech/Language:  Normal Rate  Affect: Appropriate  Mood: normal  Thought process: normal  Thought content:   WNL  Sensory/Perceptual disturbances:   WNL  Orientation: oriented to person, place, time/date, and situation  Attention: Good  Concentration: Good  Memory: WNL  Fund of knowledge:  Good  Insight:   Good  Judgment:  Good  Impulse Control: Good   Risk Assessment: Danger to Self:  No Self-injurious Behavior: No Danger to Others: No Duty to Warn:no Physical Aggression / Violence:No  Access to Firearms a concern: No  Gang Involvement:No   Subjective: Pt present for face-to-face individual therapy via video.  Pt consents to telehealth video session and is aware of limitations and benefits of virtual sessions.  Location of pt: home Location of therapist: home office.  Pt talked about  her health.   She has been sick and is having GI issues.   Pt's psychiatrist took her off two medications that could have been making her restless leg syndrome worse.  Pt has not been feeling well since.  Addressed pt's health concerns.   Pt talked about her mood.   Her mood has been stable.   She has some things to look forward to including a trip to [place]  this year.   Pt has gotten an electric bike that she rides with her husband.   This is a positive step to help pt get out of the house more.  Worked on self care strategies. Provided supportive therapy.    Interventions: Cognitive Behavioral Therapy and Insight-Oriented  Diagnosis:F33.1  Plan of Care: Recommend ongoing therapy.   Pt participated in setting treatment goals.  Plan to meet monthly.  Pt agrees with treatment  plan.  Treatment Plan Client Abilities/Strengths  Pt is bright, engaging, and motivated for therapy.   Client Treatment Preferences  Individual therapy.  Client Statement of Needs  Improve coping skills.  Symptoms  Depressed or irritable mood. Diminished interest in or enjoyment of activities. Lack of energy. Poor concentration and indecisiveness. Social withdrawal.  Problems Addressed  Unipolar Depression Goals 1. Alleviate depressive symptoms and return to previous level of effective functioning. 2. Appropriately grieve the loss in order to normalize mood and to return to previously adaptive level of functioning. Objective Learn and implement behavioral strategies to overcome depression. Target Date: 2023-12-02 Frequency: monthly  Progress: 55 Modality: individual  Related Interventions Engage the client in behavioral activation, increasing his/her activity level and contact with sources of reward, while identifying processes that inhibit activation.  Use behavioral techniques such as instruction, rehearsal, role-playing, role reversal, as needed, to facilitate activity in the client's daily life; reinforce success. Assist the client in developing skills that increase the likelihood of deriving pleasure from behavioral activation (e.g., assertiveness skills, developing an exercise plan, less internal/more external focus, increased social involvement); reinforce success. Objective Identify important people in life, past and present, and describe the quality, good and poor, of those relationships. Target Date: 2023-12-02 Frequency: monthly  Progress: 55 Modality: individual  Related Interventions Conduct Interpersonal Therapy beginning with the assessment of the client's interpersonal inventory of important past and present relationships; develop a case formulation linking depression to grief, interpersonal role disputes, role transitions,  and/or interpersonal  deficits). Objective Learn and implement problem-solving and decision-making skills. Target Date: 2023-12-02 Frequency: monthly  Progress: 55 Modality: individual  Related Interventions Conduct Problem-Solving Therapy using techniques such as psychoeducation, modeling, and role-playing to teach client problem-solving skills (i.e., defining a problem specifically, generating possible solutions, evaluating the pros and cons of each solution, selecting and implementing a plan of action, evaluating the efficacy of the plan, accepting or revising the plan); role-play application of the problem-solving skill to a real life issue. Encourage in the client the development of a positive problem orientation in which problems and solving them are viewed as a natural part of life and not something to be feared, despaired, or avoided. 3. Develop healthy interpersonal relationships that lead to the alleviation and help prevent the relapse of depression. 4. Develop healthy thinking patterns and beliefs about self, others, and the world that lead to the alleviation and help prevent the relapse of depression. 5. Recognize, accept, and cope with feelings of depression. Diagnosis F33.1  Conditions For Discharge Achievement of treatment goals and objectives   [name], LCSW

## 2023-02-26 ENCOUNTER — Telehealth: Payer: 59 | Admitting: Physician Assistant

## 2023-02-26 DIAGNOSIS — H6691 Otitis media, unspecified, right ear: Secondary | ICD-10-CM

## 2023-02-26 MED ORDER — AMOXICILLIN 875 MG PO TABS
875.0000 mg | ORAL_TABLET | Freq: Two times a day (BID) | ORAL | 0 refills | Status: DC
  Filled 2023-02-26: qty 20, 10d supply, fill #0

## 2023-02-26 MED ORDER — NEOMYCIN-POLYMYXIN-HC 3.5-10000-1 OT SOLN
3.0000 [drp] | Freq: Four times a day (QID) | OTIC | 0 refills | Status: AC
  Filled 2023-02-26: qty 10, 7d supply, fill #0

## 2023-02-26 NOTE — Progress Notes (Signed)
E-Visit for Ear Pain - Acute Otitis Media   We are sorry that you are not feeling well. Here is how we plan to help!  Based on what you have shared with me it looks like you have Acute Otitis Media.  Acute Otitis Media is an infection of the middle or "inner" ear. This type of infection can cause redness, inflammation, and fluid buildup behind the tympanic membrane (ear drum).  The usual symptoms include: Earache/Pain Fever Upper respiratory symptoms Lack of energy/Fatigue/Malaise Slight hearing loss gradually worsening- if the inner ear fills with fluid What causes middle ear infections? Most middle ear infections occur when an infection such as a cold, leads to a build-up of mucus in the middle ear and causes the Eustachian tube (a thin tube that runs from the middle ear to the back of the nose) to become swollen or blocked.   This means mucus can't drain away properly, making it easier for an infection to spread into the middle ear.  How middle ear infections are treated: Most ear infections clear up within three to five days and don't need any specific treatment. If necessary, tylenol or ibuprofen should be used to relieve pain and a high temperature.  If you develop a fever higher than 102, or any significantly worsening symptoms, this could indicate a more serious infection moving to the middle/inner and needs face to face evaluation in an office by a provider.   Antibiotics aren't routinely used to treat middle ear infections, although they may occasionally be prescribed if symptoms persist or are particularly severe. Given your presentation,   I have prescribed Amoxicillin 875 mg one tablet twice daily for 10 days  I have prescribed: Neomycin 0.35%, polymyxin B 10,000 units/mL, and hydrocortisone 0,5% otic solution 3 drops in affected ears four times a day for 7 days   Your symptoms should improve over the next 3 days and should resolve in about 7 days. Be sure to complete ALL of  the prescription(s) given.  HOME CARE: Wash your hands frequently. If you are prescribed an ear drop, do not place the tip of the bottle on your ear or touch it with your fingers. You can take Acetaminophen 650 mg every 4-6 hours as needed for pain.  If pain is severe or moderate, you can apply a heating pad (set on low) or hot water bottle (wrapped in a towel) to outer ear for 20 minutes.  This will also increase drainage.  GET HELP RIGHT AWAY IF: Fever is over 102.2 degrees. You develop progressive ear pain or hearing loss. Ear symptoms persist longer than 3 days after treatment.  MAKE SURE YOU: Understand these instructions. Will watch your condition. Will get help right away if you are not doing well or get worse.  Thank you for choosing an e-visit.  Your e-visit answers were reviewed by a board certified advanced clinical practitioner to complete your personal care plan. Depending upon the condition, your plan could have included both over the counter or prescription medications.  Please review your pharmacy choice. Make sure the pharmacy is open so you can pick up the prescription now. If there is a problem, you may contact your provider through [employer] and have the prescription routed to another pharmacy.  Your safety is important to [place]. If you have drug allergies check your prescription carefully.   For the next 24 hours you can use MyChart to ask questions about today's visit, request a non-urgent call back, or ask for  a work or school excuse. You will get an email with a survey after your eVisit asking about your experience. We would appreciate your feedback. I hope that your e-visit has been valuable and will aid in your recovery.   I have spent 5 minutes in review of e-visit questionnaire, review and updating patient chart, medical decision making and response to patient.   Gilberto Better, PA-C

## 2023-02-27 ENCOUNTER — Other Ambulatory Visit (HOSPITAL_COMMUNITY): Payer: Self-pay

## 2023-03-05 ENCOUNTER — Telehealth: Payer: 59 | Admitting: Physician Assistant

## 2023-03-05 DIAGNOSIS — H66001 Acute suppurative otitis media without spontaneous rupture of ear drum, right ear: Secondary | ICD-10-CM

## 2023-03-06 NOTE — Progress Notes (Signed)
  Because you are failing a first line treatment, I feel your condition warrants further evaluation and I recommend that you be seen in a face-to-face visit.   NOTE: There will be NO CHARGE for this E-Visit   If you are having a true medical emergency, please call 911.     For an urgent face to face visit, [facility] has multiple urgent care centers for your convenience.  Click the link below for the full list of locations and hours, walk-in wait times, appointment scheduling options and driving directions:  Urgent Care - [place], [place], [place], [place], [place], [place]  [place]     Your MyChart E-visit questionnaire answers were reviewed by a board certified advanced clinical practitioner to complete your personal care plan based on your specific symptoms.    Thank you for using e-Visits.    I have spent 5 minutes in review of e-visit questionnaire, review and updating patient chart, medical decision making and response to patient.   Margaretann Loveless, PA-C

## 2023-03-07 ENCOUNTER — Ambulatory Visit
Admission: RE | Admit: 2023-03-07 | Discharge: 2023-03-07 | Disposition: A | Discharge status: Home / Self Care | Payer: 59 | Source: Ambulatory Visit | Attending: Family Medicine | Admitting: Family Medicine

## 2023-03-07 ENCOUNTER — Other Ambulatory Visit: Payer: Self-pay

## 2023-03-07 VITALS — BP 113/73 | HR 89 | Temp 97.8°F | Resp 18 | Ht 69.0 in | Wt 170.0 lb

## 2023-03-07 DIAGNOSIS — L239 Allergic contact dermatitis, unspecified cause: Secondary | ICD-10-CM | POA: Diagnosis not present

## 2023-03-07 MED ORDER — TRIAMCINOLONE ACETONIDE 0.1 % EX CREA: 1.0000 | TOPICAL_CREAM | Freq: Two times a day (BID) | CUTANEOUS | 0 refills | Status: DC

## 2023-03-07 NOTE — ED Provider Notes (Signed)
[facility]    CSN: [id number] Arrival date & time: 03/07/23  1420      History   Chief Complaint Chief Complaint  Patient presents with   Follow-up    Had e visit Rx. Amoxicillin 875mg  and Rx ear ggts. for Rx. ear infection. Now Rt. Ear lobe and Lt. ear lobe swollen and both ear itch excessively. Also a few bumps on ears. No pain. - Entered by patient   Ear Swelling & Itching    HPI [facility] is a 63 y.o. female.  Rash and itching to both and behind ears.  Admits mild swelling of pinna.  Had a telehealth visit 2 weeks ago was treated with amoxicillin and drops no improvement in symptoms. Denies change in hygiene products or dyes.  Admits history of nickel allergy has not changed earrings recently. Denies sore throat, swollen glands, fever, chills, sweats history of similar rash  The history is provided by the patient.    Past Medical History:  Diagnosis Date   Anxiety    Atrial fibrillation (HCC)    a. Event monitor 2013 - PACs/bradycardia/SVT/short run of atrial fib by event monitor.    BIPOLAR DISORDER UNSPECIFIED    Bradycardia    Bursitis of hip 09/1999   Bilateral - Dr. Luiz Blare   DEPRESSION    Emphysema lung (HCC) 06/20/2016   pt states pulmonalongist stated started recently   GERD    Headache(784.0)    was with menstrual cycle. no longer a problem   HYPERLIPIDEMIA    Hypertension    Hypertension    IRRITABLE BOWEL SYNDROME, HX OF    Menopausal state 01/2012   [facility] = 88.5   Mitral valve prolapse 12/17/2000   a. dx 1980s, most recent echo did not demonstrate this.   MYALGIA    Normal coronary arteries    a. by cardiac CT 2013.   PAT (paroxysmal atrial tachycardia) (HCC)    PE (pulmonary embolism) 02/09/2015   a. Bilateral PEs 01/2015 when d-dimer 0.69, CP lying on left side. Followed by heme-onc - + lupus anticoagulant, mildly depressed protein S. Dr. Myna Hidalgo is not certain if her hypercoagulable studies are significant for a thrombophilic  state.   Premature atrial contractions    Pulmonary embolus (HCC) 02/10/2015   Shortness of breath    Pulmonary eval 05/2002   Thyroid nodule 2014   Tremors of nervous system     Patient Active Problem List   Diagnosis Date Noted   Acute left-sided low back pain without sciatica 04/29/2022   Polyp of colon 04/29/2022   Abnormal weight gain 07/17/2021   Preventative health care 07/17/2021   Diverticular disease of colon 07/17/2021   Dysphagia 07/17/2021   Family history of malignant neoplasm of digestive organs 07/17/2021   Second degree hemorrhoids 07/17/2021   Change in bowel habit 07/17/2021   Protein S deficiency (HCC) 12/10/2020   Thyroid nodule 12/10/2020   Other fatigue 12/10/2020   COVID-19 virus infection 07/16/2020   Chronic left shoulder pain 03/13/2020   RUQ pain 07/04/2019   Rosacea, acne 06/29/2019   Hyperlipidemia LDL goal <100 05/26/2017   Acute perforated appendicitis s/p lap appendectomy 03/17/2017 03/16/2017   History of pulmonary embolus - Dec 2017 03/16/2017   Lupus anticoagulant positive 03/16/2017   Irritable bowel syndrome (IBS) 03/16/2017   Anxiety and depression 03/16/2017   SOB (shortness of breath) 04/25/2016   Laryngopharyngeal reflux (LPR) 02/13/2016   Throat discomfort 02/13/2016   Chronic saddle pulmonary embolism with  acute cor pulmonale (HCC)    Chest pain 02/09/2015   Severe recurrent major depression without psychotic features (HCC)    Goiter, nontoxic, multinodular 12/18/2014   Tremor 05/09/2012   SVT (supraventricular tachycardia) (HCC) 12/03/2011   Hypertension 08/26/2010   Memory loss 04/08/2010   Hyperlipidemia 03/13/2009   Bipolar 2 disorder (HCC) 03/13/2009   Hypothyroidism 07/06/2008   GERD 11/05/2006   ACNE NEC 11/05/2006   DEPRESSION 07/23/2006    Past Surgical History:  Procedure Laterality Date   COLONOSCOPY     LAPAROSCOPIC APPENDECTOMY N/A 03/17/2017   Procedure: APPENDECTOMY LAPAROSCOPIC;  Surgeon: Glenna Fellows, MD;  Location: [facility];  Service: General;  Laterality: N/A;   Lipoma (R) side  1990   Right back    OB History     Gravida  0   Para  0   Term      Preterm      AB      Living         SAB      IAB      Ectopic      Multiple      Live Births               Home Medications    Prior to Admission medications   Medication Sig Start Date End Date Taking? Authorizing Provider  ALPRAZolam Prudy Feeler) 0.5 MG tablet Take 1 tablet (0.5 mg total) by mouth 2 (two) times daily as needed. 11/13/22   Cottle, Steva Ready., MD  amoxicillin (AMOXIL) 875 MG tablet Take 1 tablet (875 mg total) by mouth 2 (two) times daily. 02/26/23   Gilberto Better, PA-C  aspirin EC 325 MG tablet Take 325 mg by mouth daily.    [provider]  clobetasol ointment (TEMOVATE) 0.05 % Apply topically to nail, proximal nail fold daily on weekends only. Avoid applying to face, groin, and axilla. Use as directed. 01/20/22   Moye, [place], MD  dexlansoprazole (DEXILANT) 60 MG capsule Take 1 capsule (60 mg total) by mouth daily. 12/31/22   Donato Schultz, DO  Dextromethorphan-buPROPion ER (AUVELITY) 45-105 MG TBCR Take 1 tablet by mouth 2 (two) times daily. 01/20/23   Cottle, Steva Ready., MD  diltiazem (CARTIA XT) 180 MG 24 hr capsule Take 1 capsule (180 mg total) by mouth daily. 12/02/22   Donato Schultz, DO  fexofenadine (ALLEGRA ALLERGY) 180 MG tablet Take 1 tablet (180 mg total) by mouth daily. 04/24/20   Seabron Spates R, DO  fluticasone (FLONASE) 50 MCG/ACT nasal spray Place 2 sprays into the nose daily as needed. For allergies    [provider]  lidocaine (XYLOCAINE) 5 % ointment Apply a pea sized amount topically 20 minutes prior to intercourse, wipe off just prior to intercourse. 07/17/21   Romualdo Bolk, MD  modafinil (PROVIGIL) 200 MG tablet Take 1&1/2 tablets (300 mg total) by mouth in the morning. 01/20/23   Cottle, Steva Ready., MD  NONFORMULARY OR  COMPOUNDED ITEM sm Azelaic acid/ metronidazole/ivermectin 15%/1%/1% cream apply to face bid 02/17/23   Zola Button, [place]ling [place], DO  OLANZapine (ZYPREXA) 5 MG tablet Take 1 tablet (5 mg total) by mouth at bedtime. 11/13/22   Cottle, Steva Ready., MD  polyethylene glycol (GOLYTELY) 236 g solution Use as directed 09/24/22     pramipexole (MIRAPEX) 1 MG tablet Take 1/2 tablet daily 90 minutes before sleep for 1 week. If restless legs persists, increase to 1 tablet  daily 90 minutes before sleep 01/20/23 03/23/23  Cottle, Steva Ready., MD  Probiotic Product (PROBIOTIC DAILY PO) Take 1 tablet by mouth daily.     [provider]  propranolol (INDERAL) 20 MG tablet Take 1 tablet (20 mg total) by mouth 2 (two) times daily. 01/28/23   Cottle, Steva Ready., MD  sertraline (ZOLOFT) 50 MG tablet Take 1&1/2 tablets (75 mg total) by mouth daily. 01/20/23   Cottle, Steva Ready., MD  tretinoin (RETIN-A) 0.025 % cream Apply topically at bedtime. 07/02/22   Moye, [place], MD  trimethoprim (TRIMPEX) 100 MG tablet Take 1 tablet (100 mg total) by mouth as directed after intercourse. 01/28/23   Donato Schultz, DO    Family History Family History  Problem Relation Age of Onset   Lung cancer Mother    Hypertension Mother    Hyperlipidemia Mother    Cancer Father        Esophageal cancer   Hyperlipidemia Father    Depression Father    Cancer Brother    Pulmonary embolism Brother    Colon cancer Paternal Uncle    Sarcoidosis Other    Testicular cancer Other     Social History Social History   Tobacco Use   Smoking status: Former    Current packs/day: 0.00    Average packs/day: 1 pack/day for 25.0 years (25.0 ttl pk-yrs)    Types: Cigarettes    Start date: 02/11/1971    Quit date: 02/11/1996    Years since quitting: 27.0   Smokeless tobacco: Never   Tobacco comments:    Married, lives with spouse. 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   Respiratory:  Positive for cough.   Gastrointestinal:  Negative for nausea and vomiting.  Skin:  Positive for rash.     Physical Exam Triage Vital Signs ED Triage Vitals  Encounter Vitals Group     BP 03/07/23 1447 113/73     Systolic BP Percentile --      Diastolic BP Percentile --      Pulse Rate 03/07/23 1447 89     Resp 03/07/23 1447 18     Temp 03/07/23 1447 97.8 F (36.6 C)     Temp Source 03/07/23 1447 Oral     SpO2 03/07/23 1447 96 %     Weight 03/07/23 1445 170 lb (77.1 kg)     Height 03/07/23 1445 5\' 9"  (1.753 m)     Head Circumference --      Peak Flow --      Pain Score 03/07/23 1445 0     Pain Loc --      Pain Education --      Exclude from Growth Chart --    No data found.  Updated Vital Signs BP 113/73 (BP Location: Right Arm)   Pulse 89   Temp 97.8 F (36.6 C) (Oral)   Resp 18   Ht 5\' 9"  (1.753 m)   Wt 170 lb (77.1 kg)   LMP 05/11/2012   SpO2 96%   BMI 25.10 kg/m   Visual Acuity Right Eye Distance:   Left Eye Distance:   Bilateral Distance:  Right Eye Near:   Left Eye Near:    Bilateral Near:     Physical Exam Vitals reviewed.  Constitutional:      Appearance: She is not ill-appearing.  HENT:     Head: Normocephalic and atraumatic.     Right Ear: Tympanic membrane and ear canal normal.     Left Ear: Tympanic membrane and ear canal normal.     Ears:     Comments: Right pinna is mildly erythematous, there is a fine papular rash posterior pinna and behind the ear, minimal erythema no increased warmth no vesicles or pustules no active drainage.  Mild similar rash left ear Cardiovascular:     Rate and Rhythm: Normal rate and regular rhythm.     Heart sounds: Normal heart sounds.  Pulmonary:     Effort: Pulmonary effort is normal.     Breath sounds:  Normal breath sounds.  Musculoskeletal:     Cervical back: Neck supple.  Lymphadenopathy:     Cervical: No cervical adenopathy.  Neurological:     Mental Status: She is alert.      UC Treatments / Results  Labs (all labs ordered are listed, but only abnormal results are displayed) Labs Reviewed - No data to display  EKG   Radiology No results found.  Procedures Procedures (including critical care time)  Medications Ordered in UC Medications - No data to display  Initial Impression / Assessment and Plan / UC Course  I have reviewed the triage vital signs and the nursing notes.  Pertinent labs & imaging results that were available during my care of the patient were reviewed by me and considered in my medical decision making (see chart for details).     63 year old female with itchy rash on and behind both ears consistent with contact dermatitis.  She takes an antihistamine daily will add a topical steroid cream Final Clinical Impressions(s) / UC Diagnoses   Final diagnoses:  None   Discharge Instructions   None    ED Prescriptions   None    PDMP not reviewed this encounter.   Meliton Rattan, [place] 03/07/23 [phone]

## 2023-03-07 NOTE — ED Triage Notes (Addendum)
Pt presents with complaints of bilateral ear itching and ear swelling x 2 weeks. Pt denies pain. Pt met with telehealth provider last Friday 1/17. Pt was prescribed oral amoxicillin and ear drops with no improvement. Denies fevers at home.

## 2023-03-07 NOTE — Discharge Instructions (Addendum)
Continue your antihistamine daily Follow-up with symptoms worsen

## 2023-03-09 ENCOUNTER — Other Ambulatory Visit (HOSPITAL_COMMUNITY): Payer: Self-pay

## 2023-03-19 ENCOUNTER — Ambulatory Visit: Payer: 59 | Admitting: Psychology

## 2023-03-19 DIAGNOSIS — F331 Major depressive disorder, recurrent, moderate: Secondary | ICD-10-CM | POA: Diagnosis not present

## 2023-03-19 NOTE — Progress Notes (Signed)
 [facility] Counselor/Therapist Progress Note  Patient ID: [facility], MRN: [id number],    Date: 03/19/2023  Time Spent: 3:00pm-3:45pm   45 minutes   Treatment Type: Individual Therapy  Reported Symptoms: stress  Mental Status Exam: Appearance:  Casual     Behavior: Appropriate  Motor: Normal  Speech/Language:  Normal Rate  Affect: Appropriate  Mood: normal  Thought process: normal  Thought content:   WNL  Sensory/Perceptual disturbances:   WNL  Orientation: oriented to person, place, time/date, and situation  Attention: Good  Concentration: Good  Memory: WNL  Fund of knowledge:  Good  Insight:   Good  Judgment:  Good  Impulse Control: Good   Risk Assessment: Danger to Self:  No Self-injurious Behavior: No Danger to Others: No Duty to Warn:no Physical Aggression / Violence:No  Access to Firearms a concern: No  Gang Involvement:No   Subjective: Pt present for face-to-face individual therapy via video.  Pt consents to telehealth video session and is aware of limitations and benefits of virtual sessions.  Location of pt: home Location of therapist: home office.  Pt talked about having a rough month going off a couple of medications.   She has gone off them under the care of her psychiatrist.   Now that pt has adjusted to the change she is feeling much better.   Pt talked about [name] getting a promotion.   She is glad about the increase in pay but he is working more hours which impacts their time together.   Pt talked about politics and how upsetting the changes are that the new administration is trying to put into place.   Helped pt process her thoughts and feelings.  Addressed her sense of powerlessness over what is happening.   Worked on self care strategies. Provided supportive therapy.    Interventions: Cognitive Behavioral Therapy and Insight-Oriented  Diagnosis:F33.1  Plan of Care: Recommend ongoing therapy.   Pt participated in setting treatment  goals.  Plan to meet monthly.  Pt agrees with treatment plan.  Treatment Plan Client Abilities/Strengths  Pt is bright, engaging, and motivated for therapy.   Client Treatment Preferences  Individual therapy.  Client Statement of Needs  Improve coping skills.  Symptoms  Depressed or irritable mood. Diminished interest in or enjoyment of activities. Lack of energy. Poor concentration and indecisiveness. Social withdrawal.  Problems Addressed  Unipolar Depression Goals 1. Alleviate depressive symptoms and return to previous level of effective functioning. 2. Appropriately grieve the loss in order to normalize mood and to return to previously adaptive level of functioning. Objective Learn and implement behavioral strategies to overcome depression. Target Date: 2023-12-02 Frequency: monthly  Progress: 55 Modality: individual  Related Interventions Engage the client in behavioral activation, increasing his/her activity level and contact with sources of reward, while identifying processes that inhibit activation.  Use behavioral techniques such as instruction, rehearsal, role-playing, role reversal, as needed, to facilitate activity in the client's daily life; reinforce success. Assist the client in developing skills that increase the likelihood of deriving pleasure from behavioral activation (e.g., assertiveness skills, developing an exercise plan, less internal/more external focus, increased social involvement); reinforce success. Objective Identify important people in life, past and present, and describe the quality, good and poor, of those relationships. Target Date: 2023-12-02 Frequency: monthly  Progress: 55 Modality: individual  Related Interventions Conduct Interpersonal Therapy beginning with the assessment of the client's interpersonal inventory of important past and present relationships; develop a case formulation linking depression to grief, interpersonal role  disputes,  role transitions, and/or interpersonal deficits). Objective Learn and implement problem-solving and decision-making skills. Target Date: 2023-12-02 Frequency: monthly  Progress: 55 Modality: individual  Related Interventions Conduct Problem-Solving Therapy using techniques such as psychoeducation, modeling, and role-playing to teach client problem-solving skills (i.e., defining a problem specifically, generating possible solutions, evaluating the pros and cons of each solution, selecting and implementing a plan of action, evaluating the efficacy of the plan, accepting or revising the plan); role-play application of the problem-solving skill to a real life issue. Encourage in the client the development of a positive problem orientation in which problems and solving them are viewed as a natural part of life and not something to be feared, despaired, or avoided. 3. Develop healthy interpersonal relationships that lead to the alleviation and help prevent the relapse of depression. 4. Develop healthy thinking patterns and beliefs about self, others, and the world that lead to the alleviation and help prevent the relapse of depression. 5. Recognize, accept, and cope with feelings of depression. Diagnosis F33.1  Conditions For Discharge Achievement of treatment goals and objectives   [name], LCSW

## 2023-03-23 ENCOUNTER — Other Ambulatory Visit (HOSPITAL_BASED_OUTPATIENT_CLINIC_OR_DEPARTMENT_OTHER): Payer: Self-pay

## 2023-03-23 ENCOUNTER — Other Ambulatory Visit (HOSPITAL_COMMUNITY): Payer: Self-pay

## 2023-03-23 ENCOUNTER — Ambulatory Visit: Payer: 59 | Admitting: Family Medicine

## 2023-03-23 ENCOUNTER — Encounter: Payer: Self-pay | Admitting: Family Medicine

## 2023-03-23 VITALS — BP 110/78 | HR 85 | Temp 98.3°F | Resp 18 | Ht 69.0 in | Wt 170.2 lb

## 2023-03-23 DIAGNOSIS — E039 Hypothyroidism, unspecified: Secondary | ICD-10-CM | POA: Diagnosis not present

## 2023-03-23 DIAGNOSIS — E785 Hyperlipidemia, unspecified: Secondary | ICD-10-CM | POA: Diagnosis not present

## 2023-03-23 DIAGNOSIS — R5383 Other fatigue: Secondary | ICD-10-CM

## 2023-03-23 DIAGNOSIS — F3181 Bipolar II disorder: Secondary | ICD-10-CM

## 2023-03-23 DIAGNOSIS — K219 Gastro-esophageal reflux disease without esophagitis: Secondary | ICD-10-CM

## 2023-03-23 DIAGNOSIS — I10 Essential (primary) hypertension: Secondary | ICD-10-CM

## 2023-03-23 DIAGNOSIS — R300 Dysuria: Secondary | ICD-10-CM | POA: Diagnosis not present

## 2023-03-23 DIAGNOSIS — N390 Urinary tract infection, site not specified: Secondary | ICD-10-CM | POA: Diagnosis not present

## 2023-03-23 DIAGNOSIS — Z0000 Encounter for general adult medical examination without abnormal findings: Secondary | ICD-10-CM

## 2023-03-23 LAB — POC URINALSYSI DIPSTICK (AUTOMATED)
Blood, UA: NEGATIVE
Glucose, UA: POSITIVE — AB
Ketones, UA: POSITIVE
Nitrite, UA: POSITIVE
Protein, UA: POSITIVE — AB
Spec Grav, UA: 1.02 (ref 1.010–1.025)
Urobilinogen, UA: >=8 U/dL — AB
pH, UA: 6.5 (ref 5.0–8.0)

## 2023-03-23 MED ORDER — RABEPRAZOLE SODIUM 20 MG PO TBEC
20.0000 mg | DELAYED_RELEASE_TABLET | Freq: Every day | ORAL | 3 refills | Status: AC
  Filled 2023-03-23: qty 90, 90d supply, fill #0
  Filled 2023-06-17: qty 90, 90d supply, fill #1
  Filled 2023-09-23 – 2023-09-24 (×3): qty 90, 90d supply, fill #2
  Filled 2023-12-11: qty 90, 90d supply, fill #3

## 2023-03-23 MED ORDER — CEPHALEXIN 500 MG PO CAPS
500.0000 mg | ORAL_CAPSULE | Freq: Two times a day (BID) | ORAL | 0 refills | Status: DC
Start: 2023-03-23 — End: 2023-04-14
  Filled 2023-03-23: qty 20, 10d supply, fill #0

## 2023-03-23 NOTE — Progress Notes (Addendum)
 Established Patient Office Visit  Subjective   Patient ID: Beverly Cole, female    DOB: 03-03-1960  Age: 63 y.o. MRN: [id number]  Chief Complaint  Patient presents with   Dysuria    Sxs started x1 week, pt states having freq, urgency, and pain until she started Azo. Started Azo on Friday    HPI .Discussed the use of AI scribe software for clinical note transcription with the patient, who gave verbal consent to proceed.  History of Present Illness   Beverly Cole is a 63 year old female who presents with symptoms following medication changes and concerns about a possible UTI. She was advised by Doctor Cottle to discontinue gabapentin and Zyprexa.  Following the discontinuation of gabapentin and Zyprexa, she experienced significant symptoms. She describes a period of 24 hours where she was unable to sleep and felt like a 'zombie' while on the medication. After stopping Zyprexa, she experienced hot and cold sensations for about a month, which she found 'horrible'. She notes an unintentional weight loss, attributing it to a sped-up metabolism after stopping Zyprexa, which typically causes weight gain.  She feels 'very weak' and 'really pale', and is concerned these symptoms might be related to a urinary tract infection (UTI). She wants to have blood work done due to her symptoms and an upcoming physical next month. She is 'all red' and has not eaten lunch.  She received a letter from her insurance company stating that she will no longer cover Dexilant, a medication she has been on. She has tried other medications like omeprazole (Prilosec) and lansoprazole (Prevacid) in the past, but they were not effective. She is unfamiliar with rabeprazole.  Her husband has prostate cancer, which is part of her family history.  Patient Active Problem List   Diagnosis Date Noted   Acute left-sided low back pain without sciatica 04/29/2022   Polyp of colon 04/29/2022   Abnormal weight gain 07/17/2021    Preventative health care 07/17/2021   Diverticular disease of colon 07/17/2021   Dysphagia 07/17/2021   Family history of malignant neoplasm of digestive organs 07/17/2021   Second degree hemorrhoids 07/17/2021   Change in bowel habit 07/17/2021   Protein S deficiency (HCC) 12/10/2020   Thyroid nodule 12/10/2020   Other fatigue 12/10/2020   COVID-19 virus infection 07/16/2020   Chronic left shoulder pain 03/13/2020   RUQ pain 07/04/2019   Rosacea, acne 06/29/2019   Hyperlipidemia LDL goal <100 05/26/2017   Acute perforated appendicitis s/p lap appendectomy 03/17/2017 03/16/2017   History of pulmonary embolus - Dec 2017 03/16/2017   Lupus anticoagulant positive 03/16/2017   Irritable bowel syndrome (IBS) 03/16/2017   Anxiety and depression 03/16/2017   SOB (shortness of breath) 04/25/2016   Laryngopharyngeal reflux (LPR) 02/13/2016   Throat discomfort 02/13/2016   Chronic saddle pulmonary embolism with acute cor pulmonale (HCC)    Chest pain 02/09/2015   Severe recurrent major depression without psychotic features (HCC)    Goiter, nontoxic, multinodular 12/18/2014   Tremor 05/09/2012   SVT (supraventricular tachycardia) (HCC) 12/03/2011   Hypertension 08/26/2010   Memory loss 04/08/2010   Hyperlipidemia 03/13/2009   Bipolar 2 disorder (HCC) 03/13/2009   Hypothyroidism 07/06/2008   GERD 11/05/2006   ACNE NEC 11/05/2006   DEPRESSION 07/23/2006   Past Medical History:  Diagnosis Date   Anxiety    Atrial fibrillation (HCC)    a. Event monitor 2013 - PACs/bradycardia/SVT/short run of atrial fib by event monitor.    BIPOLAR DISORDER UNSPECIFIED  Bradycardia    Bursitis of hip 09/1999   Bilateral - Dr. Luiz Blare   DEPRESSION    Emphysema lung (HCC) 06/20/2016   pt states pulmonalongist stated started recently   GERD    Headache(784.0)    was with menstrual cycle. no longer a problem   HYPERLIPIDEMIA    Hypertension    Hypertension    IRRITABLE BOWEL SYNDROME, HX OF     Menopausal state 01/2012   [facility] = 88.5   Mitral valve prolapse 12/17/2000   a. dx 1980s, most recent echo did not demonstrate this.   MYALGIA    Normal coronary arteries    a. by cardiac CT 2013.   PAT (paroxysmal atrial tachycardia) (HCC)    PE (pulmonary embolism) 02/09/2015   a. Bilateral PEs 01/2015 when d-dimer 0.69, CP lying on left side. Followed by heme-onc - + lupus anticoagulant, mildly depressed protein S. Dr. Myna Hidalgo is not certain if her hypercoagulable studies are significant for a thrombophilic state.   Premature atrial contractions    Pulmonary embolus (HCC) 02/10/2015   Shortness of breath    Pulmonary eval 05/2002   Thyroid nodule 2014   Tremors of nervous system    Past Surgical History:  Procedure Laterality Date   COLONOSCOPY     LAPAROSCOPIC APPENDECTOMY N/A 03/17/2017   Procedure: APPENDECTOMY LAPAROSCOPIC;  Surgeon: Glenna Fellows, MD;  Location: [facility];  Service: General;  Laterality: N/A;   Lipoma (R) side  1990   Right back   Social History   Tobacco Use   Smoking status: Former    Current packs/day: 0.00    Average packs/day: 1 pack/day for 25.0 years (25.0 ttl pk-yrs)    Types: Cigarettes    Start date: 02/11/1971    Quit date: 02/11/1996    Years since quitting: 27.1   Smokeless tobacco: Never   Tobacco comments:    Married, lives with spouse. 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  Vaping Use   Vaping status: Never Used  Substance Use Topics   Alcohol use: Yes    Comment: occ   Drug use: No   Social History   Socioeconomic History   Marital status: Married    Spouse name: Not on file   Number of children: 0   Years of education: Not on file   Highest education level: Associate degree: academic program  Occupational History   Occupation: Nurse-Personal Care/HH    Employer: UNEMPLOYED  Tobacco Use   Smoking status: Former    Current packs/day: 0.00    Average packs/day: 1 pack/day for 25.0 years (25.0 ttl pk-yrs)    Types:  Cigarettes    Start date: 02/11/1971    Quit date: 02/11/1996    Years since quitting: 27.1   Smokeless tobacco: Never   Tobacco comments:    Married, lives with spouse. 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       No exercise   Social Drivers of Health   Financial Resource Strain: Low Risk  (06/20/2022)   Overall Financial Resource Strain (CARDIA)    Difficulty of Paying Living  Expenses: Not hard at all  Food Insecurity: No Food Insecurity (06/20/2022)   Hunger Vital Sign    Worried About Running Out of Food in the Last Year: Never true    Ran Out of Food in the Last Year: Never true  Transportation Needs: No Transportation Needs (06/20/2022)   PRAPARE - Administrator, Civil Service (Medical): No    Lack of Transportation (Non-Medical): No  Physical Activity: Unknown (06/20/2022)   Exercise Vital Sign    Days of Exercise per Week: 0 days    Minutes of Exercise per Session: Not on file  Stress: Stress Concern Present (06/20/2022)   [employer] of Occupational Health - Occupational Stress Questionnaire    Feeling of Stress : Very much  Social Connections: Socially Isolated (06/20/2022)   Social Connection and Isolation Panel [NHANES]    Frequency of Communication with Friends and Family: Never    Frequency of Social Gatherings with Friends and Family: Never    Attends Religious Services: Never    Database administrator or Organizations: No    Attends Engineer, structural: Not on file    Marital Status: Married  Catering manager Violence: Not on file   Family Status  Relation Name Status   Mother  Deceased       lung CA   Father   Deceased       esophageal CA   Brother  Alive       testicular CA   Nutritional therapist  (Not Specified)   Other  (Not Specified)  No partnership data on file   Family History  Problem Relation Age of Onset   Lung cancer Mother    Hypertension Mother    Hyperlipidemia Mother    Cancer Father        Esophageal cancer   Hyperlipidemia Father    Depression Father    Cancer Brother    Pulmonary embolism Brother    Colon cancer Paternal Uncle    Sarcoidosis Other    Testicular cancer Other    Allergies  Allergen Reactions   Cariprazine Other (See Comments)    Patient becomes suicidal when taking this medication.  Patient becomes suicidal when taking this medication.    Metoprolol Other (See Comments)    Hallucinations hair falls out Hallucinations hair falls out   Nickel Rash   Iron Nausea And Vomiting      Review of Systems  Constitutional:  Positive for malaise/fatigue. Negative for fever.  HENT:  Negative for congestion.   Eyes:  Negative for blurred vision.  Respiratory:  Negative for shortness of breath.   Cardiovascular:  Negative for chest pain, palpitations and leg swelling.  Gastrointestinal:  Negative for abdominal pain, blood in stool and nausea.  Genitourinary:  Negative for dysuria and frequency.  Musculoskeletal:  Negative for falls.  Skin:  Negative for rash.  Neurological:  Negative for dizziness, loss of consciousness and headaches.  Endo/Heme/Allergies:  Negative for environmental allergies.  Psychiatric/Behavioral:  Negative for depression. The patient is not nervous/anxious.       Objective:     BP 110/78 (BP Location: Left Arm, Patient Position: Sitting, Cuff Size: Normal)   Pulse 85   Temp 98.3 F (36.8 C) (Oral)   Resp 18   Ht 5\' 9"  (1.753 m)   Wt 170 lb 3.2 oz (77.2 kg)   LMP 05/11/2012   SpO2 95%   BMI 25.13 kg/m  BP Readings from Last 3 Encounters:  03/23/23  110/78  03/07/23 113/73  10/06/22 102/74   Wt Readings from Last 3 Encounters:   03/23/23 170 lb 3.2 oz (77.2 kg)  03/07/23 170 lb (77.1 kg)  10/06/22 175 lb (79.4 kg)   SpO2 Readings from Last 3 Encounters:  03/23/23 95%  03/07/23 96%  10/06/22 98%      Physical Exam Vitals and nursing note reviewed.  Constitutional:      General: She is not in acute distress.    Appearance: Normal appearance. She is well-developed.  HENT:     Head: Normocephalic and atraumatic.  Eyes:     General: No scleral icterus.       Right eye: No discharge.        Left eye: No discharge.  Cardiovascular:     Rate and Rhythm: Normal rate and regular rhythm.     Heart sounds: No murmur heard. Pulmonary:     Effort: Pulmonary effort is normal. No respiratory distress.     Breath sounds: Normal breath sounds.  Musculoskeletal:        General: Normal range of motion.     Cervical back: Normal range of motion and neck supple.     Right lower leg: No edema.     Left lower leg: No edema.  Skin:    General: Skin is warm and dry.  Neurological:     Mental Status: She is alert and oriented to person, place, and time.  Psychiatric:        Mood and Affect: Mood normal.        Behavior: Behavior normal.        Thought Content: Thought content normal.        Judgment: Judgment normal.      Results for orders placed or performed in visit on 03/23/23  POCT Urinalysis Dipstick (Automated)  Result Value Ref Range   Color, UA Amber    Clarity, UA hazy    Glucose, UA Positive (A) Negative   Bilirubin, UA large    Ketones, UA Positive    Spec Grav, UA 1.020 1.010 - 1.025   Blood, UA negative    pH, UA 6.5 5.0 - 8.0   Protein, UA Positive (A) Negative   Urobilinogen, UA >=8.0 (A) 0.2 or 1.0 E.U./dL   Nitrite, UA positive    Leukocytes, UA Large (3+) (A) Negative    Last CBC Lab Results  Component Value Date   WBC 9.6 04/29/2022   HGB 12.3 04/29/2022   HCT 38.1 04/29/2022   MCV 77.7 (L) 04/29/2022   MCH 28.0 04/05/2020   RDW 17.8 (H) 04/29/2022   PLT 380.0 04/29/2022    Last metabolic panel Lab Results  Component Value Date   GLUCOSE 116 (H) 04/29/2022   NA 142 04/29/2022   K 4.2 04/29/2022   CL 107 04/29/2022   CO2 24 04/29/2022   BUN 14 04/29/2022   CREATININE 0.84 04/29/2022   GFR 74.47 04/29/2022   CALCIUM 9.1 04/29/2022   PROT 7.2 04/29/2022   ALBUMIN 4.2 04/29/2022   LABGLOB 2.6 04/05/2020   AGRATIO 1.8 04/05/2020   BILITOT 0.2 04/29/2022   ALKPHOS 94 04/29/2022   AST 18 04/29/2022   ALT 17 04/29/2022   ANIONGAP 8 12/20/2019   Last lipids Lab Results  Component Value Date   CHOL 282 (H) 04/29/2022   HDL 42.50 04/29/2022   LDLCALC 154 (H) 11/26/2017   LDLDIRECT 200.0 04/29/2022   TRIG 312.0 (H) 04/29/2022   CHOLHDL 7 04/29/2022  Last hemoglobin A1c Lab Results  Component Value Date   HGBA1C 5.3 03/20/2009   Last thyroid functions Lab Results  Component Value Date   TSH 5.42 04/29/2022   T4TOTAL 7.5 12/04/2020   Last vitamin D Lab Results  Component Value Date   VD25OH 26 (L) 03/20/2009   Last vitamin B12 and Folate Lab Results  Component Value Date   VITAMINB12 429 12/04/2020      The 10-year ASCVD risk score (Arnett DK, et al., 2019) is: 6.3%    Assessment & Plan:   Problem List Items Addressed This Visit       Unprioritized   GERD   Relevant Medications   RABEprazole (ACIPHEX) 20 MG tablet   Other fatigue   Relevant Orders   Vitamin B12   VITAMIN D 25 Hydroxy (Vit-D Deficiency, Fractures)   Iron, TIBC and Ferritin Panel   Preventative health care - Primary   Relevant Orders   CBC with Differential/Platelet   Comprehensive metabolic panel   Lipid panel   TSH   Hypothyroidism   Relevant Orders   TSH   Hypertension   Relevant Orders   CBC with Differential/Platelet   Comprehensive metabolic panel   Lipid panel   Hyperlipidemia   Relevant Orders   CBC with Differential/Platelet   Comprehensive metabolic panel   Lipid panel   Bipolar 2 disorder (HCC)   Other Visit Diagnoses        Dysuria       Relevant Orders   POCT Urinalysis Dipstick (Automated) (Completed)   Urine Culture     Urinary tract infection without hematuria, site unspecified       Relevant Medications   cephALEXin (KEFLEX) 500 MG capsule     Assessment and Plan    Urinary Tract Infection (UTI) Presents with UTI symptoms, confirmed by urinalysis showing positive glucose, bilirubin, ketones, proteins, nitrates, and leukocytes. Prefers immediate treatment. Discussed risks of untreated UTI, such as progression to pyelonephritis, and benefits of prompt antibiotics. Explained that culture results may necessitate an antibiotic change. Prescribe Keflex and send urine sample for culture.  Medication Withdrawal Experiencing withdrawal symptoms after discontinuing gabapentin and Zyprexa, including insomnia, hot and cold flashes, and unintentional weight loss. Symptoms have improved but persist. Emphasized the importance of tapering medications slowly and noted potential weight loss from Zyprexa discontinuation. Monitor symptoms and provide supportive care.  Gastroesophageal Reflux Disease (GERD) Insurance no longer covers Dexilant. Previous trials of omeprazole and lansoprazole had limited success. Discussed potential benefits and side effects of alternative proton pump inhibitors and the need for trial and error to find the most effective medication. Review approved medications and select an alternative to Dexilant.  Dermatological Issue Previously treated rash with amoxicillin and Kenalog has resolved. Now presents with a new itchy, enlarging lesion. Discussed potential causes, including eczema, and explained that cortisone cream can reduce inflammation and itching. Apply cortisone cream to the lesion and monitor for improvement.  General Health Maintenance Scheduled for a physical next month. Requests blood work due to feeling weak and pale. Discussed the importance of checking vitamin B12 and iron levels for  potential deficiencies. Order blood work including vitamin B12 and iron levels.  Follow-up Follow up at the physical appointment next month.        No follow-ups on file.    Donato Schultz, DO

## 2023-03-24 ENCOUNTER — Telehealth (INDEPENDENT_AMBULATORY_CARE_PROVIDER_SITE_OTHER): Payer: 59 | Admitting: Psychiatry

## 2023-03-24 ENCOUNTER — Encounter: Payer: Self-pay | Admitting: Family Medicine

## 2023-03-24 ENCOUNTER — Encounter: Payer: Self-pay | Admitting: Psychiatry

## 2023-03-24 DIAGNOSIS — G2581 Restless legs syndrome: Secondary | ICD-10-CM

## 2023-03-24 DIAGNOSIS — G250 Essential tremor: Secondary | ICD-10-CM | POA: Diagnosis not present

## 2023-03-24 DIAGNOSIS — F4000 Agoraphobia, unspecified: Secondary | ICD-10-CM

## 2023-03-24 DIAGNOSIS — F423 Hoarding disorder: Secondary | ICD-10-CM | POA: Diagnosis not present

## 2023-03-24 DIAGNOSIS — F411 Generalized anxiety disorder: Secondary | ICD-10-CM | POA: Diagnosis not present

## 2023-03-24 DIAGNOSIS — F40240 Claustrophobia: Secondary | ICD-10-CM | POA: Diagnosis not present

## 2023-03-24 DIAGNOSIS — F331 Major depressive disorder, recurrent, moderate: Secondary | ICD-10-CM | POA: Diagnosis not present

## 2023-03-24 DIAGNOSIS — F4001 Agoraphobia with panic disorder: Secondary | ICD-10-CM

## 2023-03-24 LAB — LIPID PANEL
Cholesterol: 253 mg/dL — ABNORMAL HIGH (ref 0–200)
HDL: 46.5 mg/dL (ref >39.00)
LDL Cholesterol: 163 mg/dL — ABNORMAL HIGH (ref 0–99)
NonHDL: 206.6
Total CHOL/HDL Ratio: 5
Triglycerides: 217 mg/dL — ABNORMAL HIGH (ref 0.0–149.0)
VLDL: 43.4 mg/dL — ABNORMAL HIGH (ref 0.0–40.0)

## 2023-03-24 LAB — URINE CULTURE
MICRO NUMBER:: 16063517
Result:: NO GROWTH
SPECIMEN QUALITY:: ADEQUATE

## 2023-03-24 LAB — VITAMIN D 25 HYDROXY (VIT D DEFICIENCY, FRACTURES): VITD: 28.95 ng/mL — ABNORMAL LOW (ref 30.00–100.00)

## 2023-03-24 LAB — IRON,TIBC AND FERRITIN PANEL
%SAT: 11 % — ABNORMAL LOW (ref 16–45)
Ferritin: 7 ng/mL — ABNORMAL LOW (ref 16–288)
Iron: 47 ug/dL (ref 45–160)
TIBC: 440 ug/dL (ref 250–450)

## 2023-03-24 LAB — COMPREHENSIVE METABOLIC PANEL
ALT: 18 U/L (ref 0–35)
AST: 19 U/L (ref 0–37)
Albumin: 4.5 g/dL (ref 3.5–5.2)
Alkaline Phosphatase: 88 U/L (ref 39–117)
BUN: 13 mg/dL (ref 6–23)
CO2: 23 meq/L (ref 19–32)
Calcium: 9.3 mg/dL (ref 8.4–10.5)
Chloride: 102 meq/L (ref 96–112)
Creatinine, Ser: 0.95 mg/dL (ref 0.40–1.20)
GFR: 63.84 mL/min (ref >60.00)
Glucose, Bld: 88 mg/dL (ref 70–99)
Potassium: 4.1 meq/L (ref 3.5–5.1)
Sodium: 137 meq/L (ref 135–145)
Total Bilirubin: 0.3 mg/dL (ref 0.2–1.2)
Total Protein: 7.4 g/dL (ref 6.0–8.3)

## 2023-03-24 LAB — VITAMIN B12: Vitamin B-12: 407 pg/mL (ref 211–911)

## 2023-03-24 LAB — CBC WITH DIFFERENTIAL/PLATELET
Basophils Absolute: 0.1 10*3/uL (ref 0.0–0.1)
Basophils Relative: 0.8 % (ref 0.0–3.0)
Eosinophils Absolute: 0.1 10*3/uL (ref 0.0–0.7)
Eosinophils Relative: 1.6 % (ref 0.0–5.0)
HCT: 37.4 % (ref 36.0–46.0)
Hemoglobin: 11.5 g/dL — ABNORMAL LOW (ref 12.0–15.0)
Lymphocytes Relative: 23.4 % (ref 12.0–46.0)
Lymphs Abs: 2 10*3/uL (ref 0.7–4.0)
MCHC: 30.6 g/dL (ref 30.0–36.0)
MCV: 74.7 fL — ABNORMAL LOW (ref 78.0–100.0)
Monocytes Absolute: 0.4 10*3/uL (ref 0.1–1.0)
Monocytes Relative: 5.1 % (ref 3.0–12.0)
Neutro Abs: 5.9 10*3/uL (ref 1.4–7.7)
Neutrophils Relative %: 69.1 % (ref 43.0–77.0)
Platelets: 405 10*3/uL — ABNORMAL HIGH (ref 150.0–400.0)
RBC: 5.01 Mil/uL (ref 3.87–5.11)
RDW: 18.1 % — ABNORMAL HIGH (ref 11.5–15.5)
WBC: 8.5 10*3/uL (ref 4.0–10.5)

## 2023-03-24 LAB — TSH: TSH: 2.89 u[IU]/mL (ref 0.35–5.50)

## 2023-03-24 NOTE — Progress Notes (Signed)
 Beverly Cole [id number] September 10, 1960 63 y.o.  Video Visit via My Chart  I connected with pt by video using My Chart and verified that I am speaking with the correct person using two identifiers.   I discussed the limitations, risks, security and privacy concerns of performing an evaluation and management service by My Chart  and the availability of in person appointments. I also discussed with the patient that there may be a patient responsible charge related to this service. The patient expressed understanding and agreed to proceed.  I discussed the assessment and treatment plan with the patient. The patient was provided an opportunity to ask questions and all were answered. The patient agreed with the plan and demonstrated an understanding of the instructions.   The patient was advised to call back or seek an in-person evaluation if the symptoms worsen or if the condition fails to improve as anticipated.  I provided 55 minutes of video time during this encounter.  The patient was located at home and the provider was located office. Session 330-400 pm  Subjective:   Patient ID:  Beverly Cole is a 63 y.o. (DOB Aug 18, 1960) female.  Chief Complaint:  Chief Complaint  Patient presents with   Follow-up   Depression   Anxiety   Sleeping Problem   Medication Reaction    Depression        Associated symptoms include decreased concentration.  Associated symptoms include no fatigue and no suicidal ideas.  Past medical history includes anxiety.   Anxiety Symptoms include decreased concentration, dizziness, nervous/anxious behavior and shortness of breath. Patient reports no chest pain, confusion or suicidal ideas.     Beverly Cole  today for follow-up of chronic depression and anxiety.  Lately depression worse than anxiety.  Seen with husband today  When seen May 11, 2018.  For persistent anxiety we elected to retry buspirone and try increasing it to 30 mg twice daily if tolerated.    Couldn't tolerate it this time DT muscle spasms.  Pharmacy called saying she could have serotonin syndrome.  When seen August 09, 2018 and she refused med changes despite chronic anxiety and depression. She remained on sertraline 50, Depakote ER 1000 mg, Wellbutrin SR 200 mg AM  seen December 09, 2018.  Because of chronic depression and dysfunction with fatigue and poor motivation we decided off label trial  trial of modafinil 200 mg 1/2 each am for 1 week then 200 mg each AM. Did see benefit from it.  More energy and working to stay out of bed more.    seen January 10, 2019.  The modafinil had been helpful.  No meds were changed.  seen April 07, 2019.  The following was noted: Still sees benefit from modafinil but still not caring for herself like she should.  Not wanting to go anywhere.  Not driven since early fall.  Hard to breathe with the mask and it creates anxiety.  Hard to be in enclosed spaces like cars and planes for extended periods.   Trying to set her alarm clock.  No hallucinations.  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  Plans to send more too.  Still follow through is not great.  Still depression and productivity is still poor but it is not as poor as it was before modafinil. Plan increase modafinil to 300 mg daily to see if function can be improved.  06/29/2019 appointment, the  following is noted: Rare Xanax.  Did increase modafinil to 300 mg daily. Saw benefit with the increase with less time in bed but still markedly functionally impaired.   It seems like I get used to it and then doesn't maintain her get up and go.  Still having a hard time with self care like hygiene. Chronic low motivation and lack of interest is unchanged.   Gall bladder problems.  It's better at the moment.    Not more sad, just like I've always been.  Can't make herself leave the house DT depression and anxiety.  She won't do  chores.   Less excess sleeping.  He works from home 2 days weekly.  No periods of hyperactivity or manic sx since the spring. Attending therapy every 2 weeks.   Can be confused when first wakes up regardless of time of day. Had episode of hallucination in the middle of the night when awakened. Scarecrow frightened her. This is rare event.  No hallucinations during the day.  Melatonin makes it worse. Has had fearful thoughts of planes crashing or bad things happening to others and it might be her fault.  Fight with brother lately and not speaking with him now. Plan:  No med changes  10/11/19 appt with the following noted: Unsteady and tremors gotten worse.  Not gone to doctor. Propranolol not helping as much.   On Depakote ER 1000mg  HS which is a reduction. So tired all the time and H says she's not doing anything.  H says accomplishing littte and still lays in bed a lot.  H says it's 2-3 PM before she gets OOB.  Memory is poor.  Everything is such a chore and doesn't want to do things. H says she starts things she doesn't finish.  Lots of new projects.   Increase modafinil on her own to 400 mg daily in the AM.  Anxious.  Rare Xanax.  Was happy when got new cats in the summer.  Anxious and Depressed and describes anxiety as Moderate. Anxiety symptoms include: Excessive Worry,.  She thinks sertraline reduces her obsessiveness.  Past hx panic so avoidant. Never get to relax.  Pt reports sleeps excessively even with modafinil. Pt reports that appetite is good. Pt reports that energy is poor and loss of interest or pleasure in usual activities, poor motivation and withdrawn from usual activities. Just don't care about things and admits to a lot of general anxiety and everything takes a lot of energy out of her.  Compulsively tapes and watches TV shows, news and awards shows.  Can't delete it bc I'll miss something.  Watches TV in bed. Concentration is poor. Suicidal thoughts:  denied by patient. But chronic  death thoughts.  Poor productivity overall.  Chronic poor self care, showers only weekly.  Just don't care.  Chronic disability.  She thinks sertraline helped the anxiety some.  Likes that H is at home.  Helps her mood.  Doesn't care about going out. Feels faint when she tries to wear a mask.  Too anxious driving and is avoidant. Collects TV shows, she can't erase unless she watches entirely. Hoards some bc afraid she might need it some day.   Rare use of Xanax bc fears addiction. Plan: Attempted referral to neurology at [facility] neurology.  They refused to see patient stating they had nothing to offer her. It was suggested to patient that she try to get in with Dr. Anne Hahn whom she had seen in the past.  01/10/2020  appointment with the following noted: Reduced modafinil to 300 mg bc didn't want to get hooked on anything about 2-3 weeks.  Did initially see benefit from the modafinil but seemed to get tolerant to it. Feels better not talking to brother bc differing views of politics.  Feels he's a negative person but never depressed. Overall thinks she's doing pretty well relatively.  However is chronically depressed and never happy.  Nothing changed with the meds. Depression worse than anxiety. H didn't think modafinil made much difference.  H CO her inactivity.. She felt it was helpful for energy initially. Had to cancel trips bc can't wear a mask for that long.  Makes her sad. Driven twice in a year or so DT anxiety. Wants prn Xanax. Plan: DC sertraline Start fluoxetine 20 mg daily with olanzapine 5 mg daily.  05/22/2020 appointment with following noted: Didn't remember to stop sertraline and took fluoxetine and olanzapine for 2 days and couldn't eat and stopped it. Then went to dermatologist December who told her couldn't take Zoloft with fungus med and stopped sertraline. Then became more depressed.  I have to go back on the Zoloft. Remained on modafinil, Wellbutrin, Depakote.  Even less  motivated to do anything like cook, clean.  Won't go to [employer] etc. Chronic depression with hopelessness to get better Plan: No med changes  07/31/2020 appointment with the following noted: Back on sertraline at 75 mg daily with Remained on modafinil, Wellbutrin, Depakote.  Aunt died and one of worst fears but not thinking about it. Family got Covid but she recovered.  She thought it might kill and didn't care. Tremors are getting worse and balance problems. Went to concert and that was a problem.  Enjoyed the concert. Also saw Athena Masse.  Music always important to her.  1980 had planned to commit suicide listening to an album over and over but didn't attempt. B and M have familial tremor also. Still anxious and depressed and haven't driven in a year DT anxiety. Plan: Option of trial olanzapine alone without fluoxetine.  Rec retry.  She agrees.  10/23/2020 appointment with the following noted: "Olanzapine is really working."  Initial SE drunk at 5 mg HS so cut it in half.  Got used to it but I feel happy and more relaxed and easier time going to sleep.  If I could still chose to die I would but clearly happier but not more hopeful.  Laughing more.  No SI. Needs to care about things more.  Chronic lack of motivations. Still doesn't want to leave the house but did enjoy a concert in [place]. Chronic unsteadiness is worse.  Last disc with doctor in March who rec PT but she never did the PT.  Misses aunt who died.   She reduced the Depakote to 500 mg HS. When stopped sertraline was not doing well and restarted it bc stressed and unhappy.  Seems to be a key med for me. Pending card test soon.  Feels better than in the past. RLS is worse after starting olanzapine and even affecting arms but had it before.  Happens after lays down at night.  Takes MG which helps.   Xanax about once every 3 weeks. Plan: Successful trial olanzapine 2.5 mg with sertraline 50 but worse RLS Trial gabapentin for  RLS 100-300 mg PM and disc SE.  Higher if needed..  Once controlled consider increase olanzapine.  01/23/2021 appointment with the following noted: Taking gabapentin 300 mg but needs to go higher  for RLS.  SE dizzy and reduced concentration. Reads on internet.   Increased olanzapine to 5 mg HS a couple of mos ago and feels better with it.  Happier.  More relaxed.  Only think that's helped in a long time. Handwriting is so much better, less shakey. Has RLS and can be bad when she lays down. Got a new kitten and enjoys it.  Now has 4 cats. Afraid to try new meds after negative reaction to Vraylar.  Afraid of any med change.  Afraid of having SI and afraid she'd do it if had SI.   Saw pulmonologist over weakness and he says she's OK except deconditioned.  Can't tolerate wearing the masks. Plan: Successful trial olanzapine 5 mg with sertraline 50 for depression and anxiety but worse RLS Consider increasing the olanzapine later if RLS can be managed. increase gabapentin for RLS 400-600 mg PM and disc SE.  Higher if needed..  Once controlled consider increase olanzapine.  03/28/2021 appointment with the following noted: Uses Ivory soap behind knee with benefit.  Increased gabapentin to 600 mg PM.  Dizziness resolved which occurred when turned over in bed.  Not a problem. RLS still 5/7 days.  But had no vacation RLS bc was walking more.  Not active at home. Sleep is great 8-9 hours and best in a long time. Enjoyed expensive vacation to [facility].  Once in a lifetime thing.  Some things she didn't like for example the cost. Went to [employer] concert and others. Depression is better.  Olanzapine really helped.  Didn't expect it.   Hasn't felt this good in years and H notices.   Caffeine 2 cups hot tea and limited. Plan: Successful trial olanzapine 5 mg with sertraline 50 for depression and anxiety but worse RLS Consider increasing the olanzapine later if RLS can be managed. increase gabapentin for ok to  increase RLS 600-900 mg PM and disc SE.  Higher if needed..  Once controlled consider increase olanzapine.  05/27/2021 phone call wanting to increase olanzapine to see if it will further help depression.  She had increased it on her own to 10 mg for a couple of weeks.  She has to increase to 15 mg.  This was agreed  06/26/21 appt noted: On sertraline 50 and olanzapine 15 mg HS with 1200 mg PM gabapentin for RLS RLS kicks in about 4 hours after olanzapine.  Prefers to stay up late bc doesn't really want to go to sleep.  RLS 5 days per week. Sleep 8 hours. Caffeine 2 cups hot tea and limited. Benefit from increase olanzapine. Helped anxiety and able to go to sleep earlier than usual.   Before trip was happier than I've been in years.  Not real happy with trip and now nothing to look forward to.   SE some dizziness with gabapentin.  But it's better.   Mood still better than before olanzapine. Plan: benefit olanzapine 15 mg with sertraline 50 for depression and anxiety but worse RLS She wants to Consider increasing the olanzapine later if RLS can be managed. OK increase olanzapine to 20 mg PM If RLS is worse then will have to reduce.  08/26/2021 appointment with the following noted: Increased olanzapine 20 mg HS.  Not sure if it made a difference.  Terrible time with memorfy ongoing. Sleep good.  Still has RLS and can get pain in her arm including yesterday at 6-7 PM.   Asks about Ozempic for OCD. Depends on H [name]. Anxiety is  better and that helps sleep.  Depression better than it was.  No longer thinks of death all the time like she use to.  Still not very active.  Need something to look forward to. Stays up late and likes doing so bc less depressed in evening.   Plan continue olanzapine 20 mg daily with sertraline 75 mg daily  11/26/2021 appointment noted:  seen with Al Decant Memory is poor.  Forgets TV shows. Taking alprazolam rarely. Overall thinks she is doing fine.  Still not driving  much.  Don't like to leave the house but went outside on the deck some which is improvement. Sleeps 12 hours nightly but then up all day. Happier with olanazapine and thinks the higher doses did help the anxiety. H doesn't see any changes in the last year.  Covid messed her up and she has become housebound.  Not social.  She got used to being inside and no motivation to change it. H cleans and prepares meals.  Doesn't have motivation for chores. Plan: continue sertraline 50 for depression and anxiety  She wants to Consider increasing the olanzapine later if RLS can be managed. Reduce olanzapine to 10 mg HS bc unclear if higher dose helped more than lower. RLS is better with MG Modafinil 300 to 400 mg daily for alertness and off label for treatment resistant depression Trial Auvelity for TRD 1 in AM for 1 week, then 1 twice daily. DC Wellbutriin  12/25/2021 phone call requesting 90-day prescription of Auvelity stating that it was working well.  02/19/22 appt noted: Current psych med: rare Xanax, Auvelity BID, no fluoxetine, modafinil 300 mg AM , olanzapine 10 pm, propranolol 20 BID, sertraline 75 mg daily. Gabapentin 1200 mg daily I feel like doing things more and I see things more clearly.  Realizes now buying things to make myself feel happier and not really a buying problem per se.  Now not driven to buy things she shouldn't.   Feels embarrassed by what she did before with this.  Had bought a lot of expensive baskets and now no longer does that.  She is happier she's better and H notices it too.  Putting things away and better cleaning.  Tired a lot.  Better motivation..  had lunch with friends scheduled for Friday but is cancelling.  Not sure whay she is avoidant socially.  Anxious about it.  Almost fears a panic attack.  Not sure when had last panic but had them when she tried to go to work.  I can't.  Doesn't drive often.  Has a Zenaida Niece but doesn't like driving it.  Can't drive at night.   Still  sleeps 12 hours in 24.   Happy the way I am right now. Some trouble with restless legs. Plan: no med changes Less depressed with Auvelity for TRD BID markedly  04/23/22 appt noted: "Haven't felt this good in years." Consistent with Auvelity BID.   Current psych meds: modafinil 300 Am, Auvelity BID, sertraline 75, olanzapine 10 mg HS, propranolol 20 BID. Asks about going off olanzapine. 2 cats put to sleep and she did well with it despite in past had SI when doing it. Still sleeps 12 hour daily bc so tired and wants to try stopping it. No RLS on gabapentin.  To [place] next month.   Plan: continue sertraline 75 for depression and anxiety  Less depressed with Auvelity for TRD BID markedly, best in years. Ok trial reduction to olanzapine to 5  mg HS .  After 30 D if well can stop it. RLS is better with MG  06/24/22 appt noted: Not often with alprazolam other than sleep if needed.   Psych med: Auvelity BID , gabapentin 1200 pm for back pain, modafinil 300 AM, olanzapine 5 mg HS, sertraline 75 mg daily, propranolol 20 BID, alprazolam 0.5 mg prn When reduced olanzapine from 10 to 5 then not as happy. But is less sleepy and tired and wants to stay on the lower dose for now. Overall still doing good and "very good".  Hurt back packing for [place] and still some back problems.  Went to [place].  Had a good time there.  Bad weather cancelled the snorkeling trip.   Overall satisfied with current problems.  Plan no med changes  11/13/22 appt noted: Psych med: Auvelity BID, gabapentin 600 pm for back pain & RLS, modafinil 300 AM, olanzapine 5 mg HS, sertraline 75 mg daily, propranolol 20 BID, alprazolam 0.5 mg prn Stress cousin bone CA really upsetting.  B needing neck surgery.  H prostate bx pending. CBD oil in drink helps her anxiety.  Just relaxes her and doesn't drive after  it or take it excessively.   Memory is not good.  Can forget what she says in conversation.  Took memory test at PCP and it was  normal.  This is not new.   She feels she has maintained the benefit from Lindsay.  Now I don't want to die which is unbelievable bc has wanted to die for 50 years.  Now just starting life again.  But now is worrying about dying.  Both parents had CA and B had CA. Cont problems with RLS.  Will occur in arms as well.  Reversed sleep schedule.  Stay up all night.  Sleep 8-10 hours in day.   Still using caffeine. Plan no changes  01/20/23 appt noted: Meds as above except Reduced gabapentin to 900 mg pm bc 1200 mg made her sleep 12 hours. Ongoing problems with RLS and not controlled.  It is at night when lays down in legs and arms when it is bad.  RLS for years.   Doubt gabapentin is helping back pain.  No other SE. Mood is "good".  Maintained benefit. Anxiety is under control for her. Problems going to sleep.  Once asleep sleeps 9 hours.   Plan: Start pramipexole 1/2 tablet 90 min before sleep for 1 week then if RLS is not better then increase to 1 tablet in evening Reduce olanzapine to 1/2 of 5 mg tablet to reduce risk RLS.   Disc risk mania.  Call if develop an of those sx as these changes could trigger mania but RLS is unmanageable at this time. Wean gabapentin to 600 mg for 1 week then reduce to 1/2 tablet nightly for 1 week and then stop it DT poor resp for RLS and back pain  03/24/23 appt noted: Psych meds: Alprazolam 0.5 twice daily as needed anxiety, Auvelity twice daily, not taking modafinil 300 every morning, pramipexole 1/2 mg in the evening, propranolol 20 twice daily, sertraline 75  .  Stopped olanzapine and gabapentin.  Had to stop modafinil bc couldn't sleep when stopped olanzapine.  Had hot and cold sensations for a month after stopping.  Gone now.  Didn't sleep after stopped both olanzapine and gabapentin.   Lost wt  5# and diet is better.  Drinking shake.  Not gaining wt.     Current UTI.  Feels really weak.  Low HCT.  High platelets. Not much px with RLS now. Pretty good. I  didn't realize how much a zombie I was before when on olanzapine. Mentally really good.  Physically not good.   Sleep is better now but choppy.   Used to sleep 12 hour/d and now 7-8 hour/d.  H says she seems a lot happier and more motivated.   Allergic to taking iron pills.  Eats red meat.   Riding cycle.   No mood px stopping olanzapine.    Past psych med trials:  Wellbutrin XL 300, Sertraline 75, fluoxetine SE brief ,  Auvelity marked positive response  Vraylar SI, Latuda, Abilify , olanzapine 20 Symbyax side effects Seroquel which caused side effects of constipation,  Depakote ER 1500mg  HS  lamotrigine,  refuses lithium because of altered taste.    Pramipexole 1 mg in 2019? 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   buspirone SE hallucinations Gabapentin for RLS  ECT 2016 without help,   Review of Systems:  Review of Systems  Constitutional:  Negative for fatigue.  Respiratory:  Positive for shortness of breath.   Cardiovascular:  Negative for chest pain.  Gastrointestinal:  Negative for abdominal pain and vomiting.  Musculoskeletal:  Positive for gait problem.  Neurological:  Positive for dizziness and tremors. Negative for weakness.       RLS  Psychiatric/Behavioral:  Positive for decreased concentration. Negative for agitation, behavioral problems, confusion, dysphoric mood, hallucinations, self-injury, sleep disturbance and suicidal ideas. The patient is nervous/anxious. The patient is not hyperactive.     Medications: I have reviewed the patient's current medications.  Current Outpatient Medications  Medication Sig Dispense Refill   ALPRAZolam (XANAX) 0.5 MG tablet Take 1 tablet (0.5 mg total) by mouth 2 (two) times daily as needed. 30 tablet 0   aspirin EC 325 MG tablet Take 325 mg by mouth daily.     cephALEXin (KEFLEX) 500 MG capsule Take 1 capsule (500 mg  total) by mouth 2 (two) times daily. 20 capsule 0   Dextromethorphan-buPROPion ER (AUVELITY) 45-105 MG TBCR Take 1 tablet by mouth 2 (two) times daily. 180 tablet 0   diltiazem (CARTIA XT) 180 MG 24 hr capsule Take 1 capsule (180 mg total) by mouth daily. 90 capsule 1   fexofenadine (ALLEGRA ALLERGY) 180 MG tablet Take 1 tablet (180 mg total) by mouth daily.     fluticasone (FLONASE) 50 MCG/ACT nasal spray Place 2 sprays into the nose daily as needed. For allergies     modafinil (PROVIGIL) 200 MG tablet Take 1&1/2 tablets (300 mg total) by mouth in the morning. 135 tablet 0   NONFORMULARY OR COMPOUNDED ITEM sm Azelaic acid/ metronidazole/ivermectin 15%/1%/1% cream apply to face bid 1 each 5   pramipexole (MIRAPEX) 1 MG tablet Take 1/2 tablet daily 90 minutes before sleep for 1 week. If restless legs persists, increase to 1 tablet daily 90 minutes before sleep 30 tablet 1   Probiotic Product (PROBIOTIC DAILY PO) Take 1 tablet by mouth daily.      propranolol (INDERAL) 20 MG tablet Take 1 tablet (20 mg total) by mouth 2 (two) times daily. 60 tablet 1   RABEprazole (ACIPHEX) 20 MG tablet Take 1 tablet (20 mg total) by mouth daily. 90 tablet 3   sertraline (ZOLOFT) 50 MG tablet Take 1&1/2 tablets (75 mg total) by mouth daily. 135 tablet 0   tretinoin (RETIN-A) 0.025 %  cream Apply topically at bedtime. 45 g 11   trimethoprim (TRIMPEX) 100 MG tablet Take 1 tablet (100 mg total) by mouth as directed after intercourse. 30 tablet 2   No current facility-administered medications for this visit.    Medication Side Effects: None  Allergies:  Allergies  Allergen Reactions   Cariprazine Other (See Comments)    Patient becomes suicidal when taking this medication.  Patient becomes suicidal when taking this medication.    Metoprolol Other (See Comments)    Hallucinations hair falls out Hallucinations hair falls out   Nickel Rash   Iron Nausea And Vomiting    Past Medical History:  Diagnosis Date    Anxiety    Atrial fibrillation (HCC)    a. Event monitor 2013 - PACs/bradycardia/SVT/short run of atrial fib by event monitor.    BIPOLAR DISORDER UNSPECIFIED    Bradycardia    Bursitis of hip 09/1999   Bilateral - Dr. Luiz Blare   DEPRESSION    Emphysema lung (HCC) 06/20/2016   pt states pulmonalongist stated started recently   GERD    Headache(784.0)    was with menstrual cycle. no longer a problem   HYPERLIPIDEMIA    Hypertension    Hypertension    IRRITABLE BOWEL SYNDROME, HX OF    Menopausal state 01/2012   [facility] = 88.5   Mitral valve prolapse 12/17/2000   a. dx 1980s, most recent echo did not demonstrate this.   MYALGIA    Normal coronary arteries    a. by cardiac CT 2013.   PAT (paroxysmal atrial tachycardia) (HCC)    PE (pulmonary embolism) 02/09/2015   a. Bilateral PEs 01/2015 when d-dimer 0.69, CP lying on left side. Followed by heme-onc - + lupus anticoagulant, mildly depressed protein S. Dr. Myna Hidalgo is not certain if her hypercoagulable studies are significant for a thrombophilic state.   Premature atrial contractions    Pulmonary embolus (HCC) 02/10/2015   Shortness of breath    Pulmonary eval 05/2002   Thyroid nodule 2014   Tremors of nervous system     Family History  Problem Relation Age of Onset   Lung cancer Mother    Hypertension Mother    Hyperlipidemia Mother    Cancer Father        Esophageal cancer   Hyperlipidemia Father    Depression Father    Cancer Brother    Pulmonary embolism Brother    Colon cancer Paternal Uncle    Sarcoidosis Other    Testicular cancer Other     Social History   Socioeconomic History   Marital status: Married    Spouse name: Not on file   Number of children: 0   Years of education: Not on file   Highest education level: Associate degree: academic program  Occupational History   Occupation: Nurse-Personal Care/HH    Employer: UNEMPLOYED  Tobacco Use   Smoking status: Former    Current packs/day: 0.00    Average  packs/day: 1 pack/day for 25.0 years (25.0 ttl pk-yrs)    Types: Cigarettes    Start date: 02/11/1971    Quit date: 02/11/1996    Years since quitting: 27.1   Smokeless tobacco: Never   Tobacco comments:    Married, lives with spouse. 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       No exercise   Social Drivers of Health   Financial Resource Strain: Low Risk  (06/20/2022)   Overall Financial Resource Strain (CARDIA)    Difficulty of Paying Living Expenses: Not hard at all  Food Insecurity: No Food Insecurity (06/20/2022)   Hunger Vital Sign    Worried About Running Out of Food in the Last Year: Never true    Ran Out of Food in the Last Year: Never true  Transportation Needs: No Transportation Needs (06/20/2022)   PRAPARE - Administrator, Civil Service (Medical): No    Lack of Transportation (Non-Medical): No  Physical Activity: Unknown (06/20/2022)   Exercise Vital Sign    Days of Exercise per Week: 0 days    Minutes of Exercise per Session: Not on file  Stress: Stress Concern Present (06/20/2022)   [employer] of Occupational Health - Occupational Stress Questionnaire    Feeling of Stress : Very much  Social Connections: Socially Isolated (06/20/2022)   Social Connection and Isolation Panel [NHANES]    Frequency of Communication with Friends and Family: Never    Frequency of Social Gatherings with Friends and Family: Never    Attends Religious Services: Never    Database administrator or Organizations: No    Attends Engineer, structural: Not on file    Marital Status: Married  Catering manager Violence: Not on file    Past Medical  History, Surgical history, Social history, and Family history were reviewed and updated as appropriate.   Please see review of systems for further details on the patient's review from today.   Objective:   Physical Exam:  LMP 05/11/2012   Physical Exam Neurological:     Mental Status: She is alert and oriented to person, place, and time.     Cranial Nerves: No dysarthria.  Psychiatric:        Attention and Perception: Attention and perception normal.        Mood and Affect: Mood is anxious. Mood is not depressed. Affect is not tearful.        Speech: Speech normal.        Behavior: Behavior is cooperative.        Thought Content: Thought content normal. Thought content is not paranoid or delusional. Thought content does not include homicidal or suicidal ideation. Thought content does not include suicidal plan.        Cognition and Memory: Cognition and memory normal.     Comments: Insight and judgment fair and less impulsive Chronic anxiety and avoidant Best mood in years.   Still better with meds.     Lab Review:     Component Value Date/Time   NA 137 03/23/2023 1543   NA 145 (H) 04/05/2020 1508   NA 142 01/21/2017 1013   NA 143 01/16/2016 1005   K 4.1 03/23/2023 1543   K 3.2 (L) 01/21/2017 1013   K 3.3 (L) 01/16/2016 1005   CL 102 03/23/2023 1543   CL 106 01/21/2017 1013   CO2 23 03/23/2023 1543   CO2 29 01/21/2017 1013   CO2 20 (L) 01/16/2016 1005   GLUCOSE 88 03/23/2023 1543   GLUCOSE 95 01/21/2017 1013   BUN 13 03/23/2023 1543   BUN 14 04/05/2020 1508   BUN 9 01/21/2017  1013   BUN 12.6 01/16/2016 1005   CREATININE 0.95 03/23/2023 1543   CREATININE 0.94 12/20/2019 1449   CREATININE 0.9 01/21/2017 1013   CREATININE 0.9 01/16/2016 1005   CALCIUM 9.3 03/23/2023 1543   CALCIUM 9.2 01/21/2017 1013   CALCIUM 9.5 01/16/2016 1005   PROT 7.4 03/23/2023 1543   PROT 7.2 04/05/2020 1508   PROT 6.9 01/21/2017 1013   PROT 7.1 01/16/2016 1005   ALBUMIN 4.5 03/23/2023  1543   ALBUMIN 4.6 04/05/2020 1508   ALBUMIN 3.6 01/16/2016 1005   AST 19 03/23/2023 1543   AST 13 (L) 12/20/2019 1449   AST 16 01/16/2016 1005   ALT 18 03/23/2023 1543   ALT 15 12/20/2019 1449   ALT 22 01/21/2017 1013   ALT 24 01/16/2016 1005   ALKPHOS 88 03/23/2023 1543   ALKPHOS 62 01/21/2017 1013   ALKPHOS 81 01/16/2016 1005   BILITOT 0.3 03/23/2023 1543   BILITOT <0.2 04/05/2020 1508   BILITOT 0.3 12/20/2019 1449   BILITOT 0.59 01/16/2016 1005   GFRNONAA 76 04/05/2020 1508   GFRNONAA >60 12/20/2019 1449   GFRAA 87 04/05/2020 1508   GFRAA >60 12/03/2018 1346       Component Value Date/Time   WBC 8.5 03/23/2023 1543   RBC 5.01 03/23/2023 1543   HGB 11.5 (L) 03/23/2023 1543   HGB 13.7 04/05/2020 1508   HGB 12.8 01/21/2017 1013   HCT 37.4 03/23/2023 1543   HCT 41.4 04/05/2020 1508   HCT 38.2 01/21/2017 1013   PLT 405.0 (H) 03/23/2023 1543   PLT 289 04/05/2020 1508   MCV 74.7 (L) 03/23/2023 1543   MCV 85 04/05/2020 1508   MCV 90 01/21/2017 1013   MCH 28.0 04/05/2020 1508   MCH 28.9 12/20/2019 1449   MCHC 30.6 03/23/2023 1543   RDW 18.1 (H) 03/23/2023 1543   RDW 15.0 04/05/2020 1508   RDW 14.5 01/21/2017 1013   LYMPHSABS 2.0 03/23/2023 1543   LYMPHSABS 2.5 04/05/2020 1508   LYMPHSABS 3.6 (H) 01/21/2017 1013   MONOABS 0.4 03/23/2023 1543   EOSABS 0.1 03/23/2023 1543   EOSABS 0.1 04/05/2020 1508   EOSABS 0.5 01/21/2017 1013   BASOSABS 0.1 03/23/2023 1543   BASOSABS 0.1 04/05/2020 1508   BASOSABS 0.0 01/21/2017 1013    No results found for: "POCLITH", "LITHIUM"   Lab Results  Component Value Date   PHENYTOIN <0.5 ug/mL (L) 11/05/2006   VALPROATE 73.7 11/25/2016    03/23/23 ferritin low at 7.   .res Assessment: Plan:    Leonna was seen today for follow-up, depression, anxiety, sleeping problem and medication reaction.  Diagnoses and all orders for this visit:  Major depressive disorder, recurrent episode, moderate (HCC)  Generalized anxiety  disorder  Panic disorder with agoraphobia  Claustrophobia  Uncontrolled restless legs syndrome  Benign essential tremor  Agoraphobia  Hoarding behavior    TRD.  Lifelong history of chronic anhedonic depression and anxiety with very poor functioning . She is highly dysfunctional historically but much better with Auvelity.  She is not currently manic.  She is tolerating the medications.  She has had multiple medication failures as noted above.  Extremely complicated patient.  It is not entirely clear to me as towhether she truly has bipolar depression or TR major depression.  She has never been manic while under my care.  She is not worse with less mood stabilizer.  Disc risk mania if she is actually bipolar.  She doesn't want more mood stabilizer trials.  However  her depression and treatment resistant anxiety were improved with olanzapine and sertraline in combination. Best response ever with current med regimen including with Auvelity addition.   Has been able to stop olanzapine and is pleased she is can lose weight now and she is not as sedated and sleeping more normal hours.  She does not need to take the modafinil now.  She apparently had some withdrawal coming off of the gabapentin which lasted for several weeks but it has resolved.  Mood has not worsened off olanzapine and her depression still well-controlled and anxiety is adequately controlled.  She is not having manic symptoms or other mood swings.  continue sertraline 75 for depression and anxiety  Less depressed with Auvelity for TRD BID markedly, best in years. No problems off the olanzapine.    RLS is unmanaged agin.  Caffeine can worsen RLS.  Disc timing in relation to olanzapine.  Discussed potential metabolic side effects associated with atypical antipsychotics, as well as potential risk for movement side effects. Advised pt to contact office if movement side effects occur.  For tremor is considering increasing the  propranolol continue 20 BID She'll check with cardiologist on this.  Push fluids bc so much Time in bed and past history of dehydration. Disc risk propranolol but it helps tremor.  Better sleep hygiene.    Continue Alprazolam 0.5 mg twice daily as needed,  Auvelity twice daily, ,  Now that she's off olanzapine she doesn't need modafinil. sertraline 75 mg daily and consider weaning propranolol 20 mg twice daily for tremor is controlled  For RLS, rec increase iron.  She can't take iron supplements per pt.  Disc this in detail.  She'll discuss with PCP soon.    FU 8 weeks  Meredith Staggers, MD, DFAPA   Please see After Visit Summary for patient specific instructions.  Future Appointments  Date Time Provider Department Center  04/16/2023  3:00 PM Marlinda Mike, [place] [facility] None  04/30/2023  3:00 PM Lonetta, [place], DO [facility]  05/14/2023  3:00 PM Bauert, Rella Larve, LCSW [facility] None  06/11/2023  3:00 PM Bauert, Rella Larve, LCSW [facility] None  08/19/2023  4:00 PM Terri Piedra, DO [facility] None    No orders of the defined types were placed in this encounter.     -------------------------------

## 2023-03-25 ENCOUNTER — Encounter: Payer: Self-pay | Admitting: Family Medicine

## 2023-03-26 ENCOUNTER — Other Ambulatory Visit: Payer: Self-pay | Admitting: Family Medicine

## 2023-03-26 DIAGNOSIS — R300 Dysuria: Secondary | ICD-10-CM

## 2023-03-26 DIAGNOSIS — N3090 Cystitis, unspecified without hematuria: Secondary | ICD-10-CM

## 2023-03-30 ENCOUNTER — Other Ambulatory Visit: Payer: Self-pay | Admitting: Psychiatry

## 2023-03-30 DIAGNOSIS — R251 Tremor, unspecified: Secondary | ICD-10-CM

## 2023-03-30 DIAGNOSIS — G2581 Restless legs syndrome: Secondary | ICD-10-CM

## 2023-03-31 ENCOUNTER — Other Ambulatory Visit (HOSPITAL_COMMUNITY): Payer: Self-pay

## 2023-03-31 ENCOUNTER — Other Ambulatory Visit: Payer: Self-pay

## 2023-03-31 MED ORDER — PRAMIPEXOLE DIHYDROCHLORIDE 1 MG PO TABS
ORAL_TABLET | ORAL | 0 refills | Status: DC
  Filled 2023-03-31: qty 30, 30d supply, fill #0

## 2023-03-31 MED ORDER — PROPRANOLOL HCL 20 MG PO TABS
20.0000 mg | ORAL_TABLET | Freq: Two times a day (BID) | ORAL | 1 refills | Status: DC
  Filled 2023-03-31: qty 60, 30d supply, fill #0
  Filled 2023-05-11: qty 60, 30d supply, fill #1

## 2023-04-13 ENCOUNTER — Other Ambulatory Visit: Payer: Self-pay

## 2023-04-13 ENCOUNTER — Ambulatory Visit: Payer: Self-pay | Admitting: Family Medicine

## 2023-04-13 ENCOUNTER — Emergency Department (HOSPITAL_BASED_OUTPATIENT_CLINIC_OR_DEPARTMENT_OTHER)
Admission: EM | Admit: 2023-04-13 | Discharge: 2023-04-13 | Disposition: A | Discharge status: Home / Self Care | Attending: Emergency Medicine | Admitting: Emergency Medicine

## 2023-04-13 ENCOUNTER — Encounter (HOSPITAL_BASED_OUTPATIENT_CLINIC_OR_DEPARTMENT_OTHER): Payer: Self-pay | Admitting: Emergency Medicine

## 2023-04-13 DIAGNOSIS — R42 Dizziness and giddiness: Secondary | ICD-10-CM | POA: Diagnosis present

## 2023-04-13 DIAGNOSIS — Z7982 Long term (current) use of aspirin: Secondary | ICD-10-CM | POA: Diagnosis not present

## 2023-04-13 DIAGNOSIS — H81399 Other peripheral vertigo, unspecified ear: Secondary | ICD-10-CM | POA: Insufficient documentation

## 2023-04-13 DIAGNOSIS — E039 Hypothyroidism, unspecified: Secondary | ICD-10-CM | POA: Insufficient documentation

## 2023-04-13 DIAGNOSIS — D649 Anemia, unspecified: Secondary | ICD-10-CM | POA: Diagnosis not present

## 2023-04-13 DIAGNOSIS — K921 Melena: Secondary | ICD-10-CM | POA: Insufficient documentation

## 2023-04-13 DIAGNOSIS — I10 Essential (primary) hypertension: Secondary | ICD-10-CM | POA: Insufficient documentation

## 2023-04-13 DIAGNOSIS — R195 Other fecal abnormalities: Secondary | ICD-10-CM

## 2023-04-13 LAB — CBC WITH DIFFERENTIAL/PLATELET
Abs Immature Granulocytes: 0.02 10*3/uL (ref 0.00–0.07)
Basophils Absolute: 0 10*3/uL (ref 0.0–0.1)
Basophils Relative: 1 %
Eosinophils Absolute: 0.1 10*3/uL (ref 0.0–0.5)
Eosinophils Relative: 2 %
HCT: 35.6 % — ABNORMAL LOW (ref 36.0–46.0)
Hemoglobin: 10.7 g/dL — ABNORMAL LOW (ref 12.0–15.0)
Immature Granulocytes: 0 %
Lymphocytes Relative: 28 %
Lymphs Abs: 2.1 10*3/uL (ref 0.7–4.0)
MCH: 22.8 pg — ABNORMAL LOW (ref 26.0–34.0)
MCHC: 30.1 g/dL (ref 30.0–36.0)
MCV: 75.7 fL — ABNORMAL LOW (ref 80.0–100.0)
Monocytes Absolute: 0.3 10*3/uL (ref 0.1–1.0)
Monocytes Relative: 5 %
Neutro Abs: 4.7 10*3/uL (ref 1.7–7.7)
Neutrophils Relative %: 64 %
Platelets: 393 10*3/uL (ref 150–400)
RBC: 4.7 MIL/uL (ref 3.87–5.11)
RDW: 17.9 % — ABNORMAL HIGH (ref 11.5–15.5)
WBC: 7.4 10*3/uL (ref 4.0–10.5)
nRBC: 0 % (ref 0.0–0.2)

## 2023-04-13 LAB — COMPREHENSIVE METABOLIC PANEL
ALT: 22 U/L (ref 0–44)
AST: 22 U/L (ref 15–41)
Albumin: 3.7 g/dL (ref 3.5–5.0)
Alkaline Phosphatase: 71 U/L (ref 38–126)
Anion gap: 12 (ref 5–15)
BUN: 14 mg/dL (ref 8–23)
CO2: 18 mmol/L — ABNORMAL LOW (ref 22–32)
Calcium: 9.4 mg/dL (ref 8.9–10.3)
Chloride: 106 mmol/L (ref 98–111)
Creatinine, Ser: 0.82 mg/dL (ref 0.44–1.00)
GFR, Estimated: >60 mL/min (ref >60)
Glucose, Bld: 124 mg/dL — ABNORMAL HIGH (ref 70–99)
Potassium: 4.1 mmol/L (ref 3.5–5.1)
Sodium: 136 mmol/L (ref 135–145)
Total Bilirubin: 0.3 mg/dL (ref 0.0–1.2)
Total Protein: 7.4 g/dL (ref 6.5–8.1)

## 2023-04-13 LAB — OCCULT BLOOD X 1 CARD TO LAB, STOOL: Fecal Occult Bld: NEGATIVE

## 2023-04-13 LAB — PROTIME-INR
INR: 1.1 (ref 0.8–1.2)
Prothrombin Time: 13.9 s (ref 11.4–15.2)

## 2023-04-13 MED ORDER — SODIUM CHLORIDE 0.9 % IV BOLUS
1000.0000 mL | Freq: Once | INTRAVENOUS | Status: AC
Start: 2023-04-13 — End: 2023-04-13
  Administered 2023-04-13: 1000 mL via INTRAVENOUS

## 2023-04-13 MED ORDER — MECLIZINE HCL 25 MG PO TABS
25.0000 mg | ORAL_TABLET | Freq: Once | ORAL | Status: AC
  Administered 2023-04-13: 25 mg via ORAL
  Filled 2023-04-13: qty 1

## 2023-04-13 MED ORDER — MECLIZINE HCL 25 MG PO TABS
25.0000 mg | ORAL_TABLET | Freq: Three times a day (TID) | ORAL | 0 refills | Status: DC | PRN
  Filled 2023-04-13: qty 20, 7d supply, fill #0

## 2023-04-13 NOTE — ED Provider Notes (Signed)
 [facility] Provider Note   CSN: [id number] Arrival date & time: 04/13/23  1517     History  Chief Complaint  Patient presents with   Dizziness   GI Bleeding    Beverly Cole is a 63 y.o. female.  Patient with history of hypothyroid, hyperlipidemia, GERD, hypertension, PE --presents to the emergency department today for evaluation of 1 week of dizziness described as a spinning sensation.  This is episodic.  It does seem to be worse with head movement.  Patient has also had 2 episodes of black stool, once a week ago and again yesterday.  No lightheadedness or syncope.  She reports some shortness of breath with exertion.  She did call PCP office today.  She has an appointment tomorrow, but was urged to come into the emergency department for assessment after describing her symptoms.  Patient denies other bleeding symptoms.  Previous colonoscopy and EGD with gastric polyps/diverticulosis.       Home Medications Prior to Admission medications   Medication Sig Start Date End Date Taking? Authorizing Provider  ALPRAZolam Prudy Feeler) 0.5 MG tablet Take 1 tablet (0.5 mg total) by mouth 2 (two) times daily as needed. 11/13/22   Cottle, Steva Ready., MD  aspirin EC 325 MG tablet Take 325 mg by mouth daily.    [provider]  cephALEXin (KEFLEX) 500 MG capsule Take 1 capsule (500 mg total) by mouth 2 (two) times daily. 03/23/23   Donato Schultz, DO  Dextromethorphan-buPROPion ER (AUVELITY) 45-105 MG TBCR Take 1 tablet by mouth 2 (two) times daily. 01/20/23   Cottle, Steva Ready., MD  diltiazem (CARTIA XT) 180 MG 24 hr capsule Take 1 capsule (180 mg total) by mouth daily. 12/02/22   Donato Schultz, DO  fexofenadine (ALLEGRA ALLERGY) 180 MG tablet Take 1 tablet (180 mg total) by mouth daily. 04/24/20   Seabron Spates R, DO  fluticasone (FLONASE) 50 MCG/ACT nasal spray Place 2 sprays into the nose daily as needed. For allergies     [provider]  NONFORMULARY OR COMPOUNDED ITEM sm Azelaic acid/ metronidazole/ivermectin 15%/1%/1% cream apply to face bid 02/17/23   Zola Button, Grayling Congress, DO  pramipexole (MIRAPEX) 1 MG tablet Take 1/2 tablet daily 90 minutes before sleep for 1 week. If restless legs persists, increase to 1 tablet daily 90 minutes before sleep 03/31/23 04/30/23  Cottle, Steva Ready., MD  Probiotic Product (PROBIOTIC DAILY PO) Take 1 tablet by mouth daily.     [provider]  propranolol (INDERAL) 20 MG tablet Take 1 tablet (20 mg total) by mouth 2 (two) times daily. 03/31/23   Cottle, Steva Ready., MD  RABEprazole (ACIPHEX) 20 MG tablet Take 1 tablet (20 mg total) by mouth daily. 03/23/23   Donato Schultz, DO  sertraline (ZOLOFT) 50 MG tablet Take 1&1/2 tablets (75 mg total) by mouth daily. 01/20/23   Cottle, Steva Ready., MD  tretinoin (RETIN-A) 0.025 % cream Apply topically at bedtime. 07/02/22   Moye, [place], MD  trimethoprim (TRIMPEX) 100 MG tablet Take 1 tablet (100 mg total) by mouth as directed after intercourse. 01/28/23   Donato Schultz, DO      Allergies    Cariprazine, Metoprolol, Nickel, and Iron    Review of Systems   Review of Systems  Physical Exam Updated Vital Signs BP 118/64 (BP Location: Left Arm)   Pulse 71   Temp 98 F (  36.7 C)   Resp 18   Ht 5\' 9"  (1.753 m)   Wt 77.1 kg   LMP 05/11/2012   SpO2 98%   BMI 25.10 kg/m   Physical Exam Vitals and nursing note reviewed.  Constitutional:      General: She is not in acute distress.    Appearance: She is well-developed. She is not diaphoretic.  HENT:     Head: Normocephalic and atraumatic.     Right Ear: External ear normal.     Left Ear: External ear normal.     Nose: Nose normal.     Mouth/Throat:     Mouth: Mucous membranes are moist. Mucous membranes are not dry.  Eyes:     Conjunctiva/sclera: Conjunctivae normal.     Comments: No nystagmus  Neck:     Vascular: Normal carotid pulses. No JVD.      Trachea: Trachea normal. No tracheal deviation.  Cardiovascular:     Rate and Rhythm: Normal rate and regular rhythm.     Pulses: No decreased pulses.          Radial pulses are 2+ on the right side and 2+ on the left side.     Heart sounds: Normal heart sounds, S1 normal and S2 normal. No murmur heard.    Comments: No tachycardia Pulmonary:     Effort: Pulmonary effort is normal. No respiratory distress.     Breath sounds: No wheezing, rhonchi or rales.  Chest:     Chest wall: No tenderness.  Abdominal:     General: Bowel sounds are normal.     Palpations: Abdomen is soft.     Tenderness: There is no abdominal tenderness. There is no guarding or rebound.  Genitourinary:    Comments: Dark stool noted on finger, small amount.  Not grossly bloody. Musculoskeletal:        General: Normal range of motion.     Cervical back: Normal range of motion and neck supple. No muscular tenderness.     Right lower leg: No edema.     Left lower leg: No edema.  Skin:    General: Skin is warm and dry.     Coloration: Skin is not pale.     Findings: No rash.  Neurological:     General: No focal deficit present.     Mental Status: She is alert. Mental status is at baseline.     Motor: No weakness.  Psychiatric:        Mood and Affect: Mood normal.     ED Results / Procedures / Treatments   Labs (all labs ordered are listed, but only abnormal results are displayed) Labs Reviewed  CBC WITH DIFFERENTIAL/PLATELET - Abnormal; Notable for the following components:      Result Value   Hemoglobin 10.7 (*)    HCT 35.6 (*)    MCV 75.7 (*)    MCH 22.8 (*)    RDW 17.9 (*)    All other components within normal limits  COMPREHENSIVE METABOLIC PANEL - Abnormal; Notable for the following components:   CO2 18 (*)    Glucose, Bld 124 (*)    All other components within normal limits  PROTIME-INR  OCCULT BLOOD X 1 CARD TO LAB, STOOL   ED ECG REPORT   Date: 04/13/2023  Rate: 67  Rhythm: normal  sinus rhythm  QRS Axis: normal  Intervals: normal  ST/T Wave abnormalities: normal  Conduction Disutrbances:none  Narrative Interpretation:   Old EKG Reviewed: unchanged from  09/2022  I have personally reviewed the EKG tracing and agree with the computerized printout as noted.   Radiology No results found.  Procedures Procedures    Medications Ordered in ED Medications  sodium chloride 0.9 % bolus 1,000 mL (1,000 mLs Intravenous New Bag/Given 04/13/23 2012)  meclizine (ANTIVERT) tablet 25 mg (25 mg Oral Given 04/13/23 1956)    ED Course/ Medical Decision Making/ A&P    Patient seen and examined. History obtained directly from patient.  Reviewed previous endoscopy results.  Labs/EKG: Independently reviewed and interpreted.  This included: CBC with normal white blood cell count, slightly low hemoglobin 10.7 less than 1 point lower than previous a few weeks back; glucose 124 otherwise unremarkable; PT/INR ordered in triage was normal.  Added Hemoccult  Imaging: None ordered  Medications/Fluids: Ordered IV fluid bolus, meclizine  Most recent vital signs reviewed and are as follows: BP 136/70   Pulse 69   Temp 98 F (36.7 C)   Resp 15   Ht 5\' 9"  (1.753 m)   Wt 77.1 kg   LMP 05/11/2012   SpO2 96%   BMI 25.10 kg/m   Initial impression: Nonspecific dizziness, vertigo type.  Minimal at the current time.  I think that the PCP office likely send the patient in for evaluation of anemia in setting of possible GI bleeding.  Will assess this further.  Hemoglobin slightly low, but not to the point of needing a transfusion at this point.  Low concern for active GI bleeding, but will check Hemoccult.  Will need outpatient follow-up.  Normal abdominal exam.  9:13 PM Reassessment performed. Patient appears stable, comfortable.  Hemoccult performed with nurse tech chaperone.  Of note, patient did get acutely dizzy when she was placed in a lying position from sitting.  Labs personally  reviewed and interpreted including: Hemoccult was negative  Reviewed pertinent lab work and imaging with patient at bedside. Questions answered.   Most current vital signs reviewed and are as follows: BP 136/70   Pulse 69   Temp 97.9 F (36.6 C) (Oral)   Resp 15   Ht 5\' 9"  (1.753 m)   Wt 77.1 kg   LMP 05/11/2012   SpO2 96%   BMI 25.10 kg/m   Plan: Discharge to home, close outpatient PCP follow-up, trial meclizine   Orthostatic VS for the past 24 hrs:  BP- Lying Pulse- Lying BP- Sitting Pulse- Sitting BP- Standing at 0 minutes Pulse- Standing at 0 minutes  04/13/23 2035 118/56 62 127/62 68 101/59 74    9:28 PM Reassessment performed. Patient appears stable.  She was up to the bathroom, did feel some dizziness with walking.  Most current vital signs reviewed and are as follows: BP 136/70   Pulse 69   Temp 97.9 F (36.6 C) (Oral)   Resp 15   Ht 5\' 9"  (1.753 m)   Wt 77.1 kg   LMP 05/11/2012   SpO2 96%   BMI 25.10 kg/m   Plan: Discharge to home.   Prescriptions written for: Meclizine  Other home care instructions discussed: Close monitoring of symptoms  ED return instructions discussed: Worsening lightheadedness, syncope, chest pain, shortness of breath, any strokelike symptoms, heavier amounts of dark stools or red blood in stool.  Follow-up instructions discussed: Patient encouraged to follow-up with their PCP in 3 days for hemoglobin recheck.  Medical Decision Making Amount and/or Complexity of Data Reviewed Labs: ordered.   Patient presents to the emergency department for dizziness like symptoms.  This is very peripheral in nature, worse with certain positions and movements.  No other abnormal neuro deficits.  As far as the dark stools go, mild worsening anemia.  Do not suspect heavy bleeding.  She is not orthostatic.  Dark stool on exam, but negative Hemoccult.  I feel that this can be managed as an outpatient.  Patient already  has an appointment for tomorrow.  The patient's vital signs, pertinent lab work and imaging were reviewed and interpreted as discussed in the ED course. Hospitalization was considered for further testing, treatments, or serial exams/observation. However as patient is well-appearing, has a stable exam, and reassuring studies today, I do not feel that they warrant admission at this time. This plan was discussed with the patient who verbalizes agreement and comfort with this plan and seems reliable and able to return to the Emergency Department with worsening or changing symptoms.          Final Clinical Impression(s) / ED Diagnoses Final diagnoses:  Peripheral vertigo, unspecified laterality  Dark stools  Anemia, unspecified type    Rx / DC Orders ED Discharge Orders          Ordered    meclizine (ANTIVERT) 25 MG tablet  3 times daily PRN        04/13/23 2126              Renne Crigler, PA-C 04/13/23 2131    Arby Barrette, MD 04/17/23 1537

## 2023-04-13 NOTE — Telephone Encounter (Signed)
 Chief Complaint: dizziness Symptoms: black/tarry stools, dizziness, weakness, nausea, SOB on exertion, muscle cramps Frequency: x 1 week Pertinent Negatives: Patient denies abdominal pain, fever, chest pain, vomiting. Disposition: [x] ED /[] Urgent Care (no appt availability in office) / [] Appointment(In office/virtual)/ []  [facility]/ [] Home Care/ [] Refused Recommended Disposition /[] [facility]/ []  Follow-up with PCP Additional Notes: Patient states she read online that she should increase her aspirin dose due to her high platelets. She states she took the increased dose for 3-4 days and then symptoms started. Patient states hx of an ulcer about 40 years ago. She states she had black tarry stools that happened last week and then again 2 large episodes yesterday. Patient states she took some Pepto Bismol last Wednesday and denies any iron supplement use. Patient agreeable to have her husband drive her to ED (states she will go to [facility])  Copied from CRM [id number]. Topic: Clinical - Red Word Triage >> Apr 13, 2023  1:36 PM Beverly Cole wrote: Red Word that prompted transfer to Nurse Triage: Extreme dizziness, weakness, DOE, muscle cramps, pulled upper back muscle. Reason for Disposition  SEVERE dizziness (e.g., unable to stand, requires support to walk, feels like passing out now)  Answer Assessment - Initial Assessment Questions 1. DESCRIPTION: "Describe your dizziness."     "The room is spinning even when I'm laying in bed".  2. VERTIGO: "Do you feel like either you or the room is spinning or tilting?"      Yes.   3. LIGHTHEADED: "Do you feel lightheaded?" (e.g., somewhat faint, woozy, weak upon standing)     Patient states especially in the morning she can not walk straight and feels weak when standing up. "I feel weak all the time".  4. SEVERITY: "How bad is it?"  "Can you walk?"   - MILD: Feels slightly dizzy and unsteady, but is walking  normally.   - MODERATE: Feels unsteady when walking, but not falling; interferes with normal activities (e.g., school, work).   - SEVERE: Unable to walk without falling, or requires assistance to walk without falling.     Severe.  5. ONSET:  "When did the dizziness begin?"     X 1 week.  6. AGGRAVATING FACTORS: "Does anything make it worse?" (e.g., standing, change in head position)     Standing up, when lying in bed is she changes position.  7. CAUSE: "What do you think is causing the dizziness?"     Patient states she thinks it is related to low ferritin, low hematocrit and elevated platelets  8. RECURRENT SYMPTOM: "Have you had dizziness before?" If Yes, ask: "When was the last time?" "What happened that time?"     Patient states she had it once in the past and it was treated with IV fluids.  9. OTHER SYMPTOMS: "Do you have any other symptoms?" (e.g., headache, weakness, numbness, vomiting, earache)     Black, tarry stools, "sometime last week" and yesterday she states she noticed them again (denies taking iron supplements and states last Pepto Bismol use was Wednesday).  Answer Assessment - Initial Assessment Questions 1. APPEARANCE of BLOOD: "What color is it?" "Is it passed separately, on the surface of the stool, or mixed in with the stool?"      Black, tarry stool.  2. AMOUNT: "How much blood was passed?"      Large amounts.  3. FREQUENCY: "How many times has blood been passed with the stools?"  2 episodes yesterday.  4. ONSET: "When was the blood first seen in the stools?" (Days or weeks)      Last week was first onset.  5. DIARRHEA: "Is there also some diarrhea?" If Yes, ask: "How many diarrhea stools in the past 24 hours?"      Denies.  6. CONSTIPATION: "Do you have constipation?" If Yes, ask: "How bad is it?"     Denies.  7. RECURRENT SYMPTOMS: "Have you had blood in your stools before?" If Yes, ask: "When was the last time?" and "What happened that time?"       Yes, 10 years ago. "I had Cole colonoscopy and it went away".   8. BLOOD THINNERS: "Do you take any blood thinners?" (e.g., Coumadin/warfarin, Pradaxa/dabigatran, aspirin)     Aspirin.  9. OTHER SYMPTOMS: "Do you have any other symptoms?"  (e.g., abdomen pain, vomiting, dizziness, fever)     SOB with exertion, dizziness, muscle cramps.  Protocols used: Dizziness - Vertigo-Cole-AH, Rectal Bleeding-Cole-AH

## 2023-04-13 NOTE — ED Notes (Signed)
 Pt laid flat for orthostatics at 8:15

## 2023-04-13 NOTE — ED Triage Notes (Addendum)
 Dizzy x 1 week  some sob with exertion no cp  has had 2 episodes of black tarry stools

## 2023-04-13 NOTE — Discharge Instructions (Signed)
 Please read and follow all provided instructions.  Your diagnoses today include:  1. Peripheral vertigo, unspecified laterality   2. Dark stools   3. Anemia, unspecified type     Tests performed today include: TESTS Vital signs. See below for your results today.   Medications prescribed:   Take any prescribed medications only as directed.  Home care instructions:  Follow any educational materials contained in this packet.  BE VERY CAREFUL not to take multiple medicines containing Tylenol (also called acetaminophen). Doing so can lead to an overdose which can damage your liver and cause liver failure and possibly death.   Follow-up instructions: Please follow-up with your primary care provider in the next 3 days for further evaluation of your symptoms.   Return instructions:  Please return to the Emergency Department if you experience worsening symptoms.  Please return if you have any other emergent concerns.  Additional Information:  Your vital signs today were: BP 136/70   Pulse 69   Temp 97.9 F (36.6 C) (Oral)   Resp 15   Ht 5\' 9"  (1.753 m)   Wt 77.1 kg   LMP 05/11/2012   SpO2 96%   BMI 25.10 kg/m  If your blood pressure (BP) was elevated above 135/85 this visit, please have this repeated by your doctor within one month. --------------

## 2023-04-14 ENCOUNTER — Other Ambulatory Visit (HOSPITAL_BASED_OUTPATIENT_CLINIC_OR_DEPARTMENT_OTHER): Payer: Self-pay

## 2023-04-14 ENCOUNTER — Other Ambulatory Visit (HOSPITAL_COMMUNITY): Payer: Self-pay

## 2023-04-14 ENCOUNTER — Ambulatory Visit: Admitting: Family Medicine

## 2023-04-14 VITALS — BP 110/70 | HR 78 | Temp 98.6°F | Resp 18 | Ht 69.0 in | Wt 174.2 lb

## 2023-04-14 DIAGNOSIS — K219 Gastro-esophageal reflux disease without esophagitis: Secondary | ICD-10-CM | POA: Diagnosis not present

## 2023-04-14 DIAGNOSIS — D509 Iron deficiency anemia, unspecified: Secondary | ICD-10-CM | POA: Diagnosis not present

## 2023-04-14 MED ORDER — ACCRUFER 30 MG PO CAPS
30.0000 mg | ORAL_CAPSULE | Freq: Every day | ORAL | 2 refills | Status: DC
  Filled 2023-04-14: qty 60, 60d supply, fill #0
  Filled 2023-04-14: qty 30, 30d supply, fill #0

## 2023-04-14 NOTE — Patient Instructions (Signed)
 How to Perform the Epley Maneuver The Epley maneuver is an exercise that relieves symptoms of vertigo. Vertigo is the feeling that you or your surroundings are moving when they are not. When you feel vertigo, you may feel like the room is spinning and may have trouble walking. The Epley maneuver is used for a type of vertigo caused by a calcium deposit in a part of the inner ear. The maneuver involves changing head positions to help the deposit move out of the area. You can do this maneuver at home whenever you have symptoms of vertigo. You can repeat it in 24 hours if your vertigo has not gone away. Even though the Epley maneuver may relieve your vertigo for a few weeks, it is possible that your symptoms will return. This maneuver relieves vertigo, but it does not relieve dizziness. What are the risks? If it is done correctly, the Epley maneuver is considered safe. Sometimes it can lead to dizziness or nausea that goes away after a short time. If you develop other symptoms--such as changes in vision, weakness, or numbness--stop doing the maneuver and call your health care provider. Supplies needed: A bed or table. A pillow. How to do the Epley maneuver     Sit on the edge of a bed or table with your back straight and your legs extended or hanging over the edge of the bed or table. Turn your head halfway toward the affected ear or side as told by your health care provider. Lie backward quickly with your head turned until you are lying flat on your back. Your head should dangle (head-hanging position). You may want to position a pillow under your shoulders. Hold this position for at least 30 seconds. If you feel dizzy or have symptoms of vertigo, continue to hold the position until the symptoms stop. Turn your head to the opposite direction until your unaffected ear is facing down. Your head should continue to dangle. Hold this position for at least 30 seconds. If you feel dizzy or have symptoms of  vertigo, continue to hold the position until the symptoms stop. Turn your whole body to the same side as your head so that you are positioned on your side. Your head will now be nearly facedown and no longer needs to dangle. Hold for at least 30 seconds. If you feel dizzy or have symptoms of vertigo, continue to hold the position until the symptoms stop. Sit back up. You can repeat the maneuver in 24 hours if your vertigo does not go away. Follow these instructions at home: For 24 hours after doing the Epley maneuver: Keep your head in an upright position. When lying down to sleep or rest, keep your head raised (elevated) with two or more pillows. Avoid excessive neck movements. Activity Do not drive or use machinery if you feel dizzy. After doing the Epley maneuver, return to your normal activities as told by your health care provider. Ask your health care provider what activities are safe for you. General instructions Drink enough fluid to keep your urine pale yellow. Do not drink alcohol. Take over-the-counter and prescription medicines only as told by your health care provider. Keep all follow-up visits. This is important. Preventing vertigo symptoms Ask your health care provider if there is anything you should do at home to prevent vertigo. He or she may recommend that you: Keep your head elevated with two or more pillows while you sleep. Do not sleep on the side of your affected ear. Get  up slowly from bed. Avoid sudden movements during the day. Avoid extreme head positions or movement, such as looking up or bending over. Contact a health care provider if: Your vertigo gets worse. You have other symptoms, including: Nausea. Vomiting. Headache. Get help right away if you: Have vision changes. Have a headache or neck pain that is severe or getting worse. Cannot stop vomiting. Have new numbness or weakness in any part of your body. These symptoms may represent a serious problem  that is an emergency. Do not wait to see if the symptoms will go away. Get medical help right away. Call your local emergency services (911 in the [place] Do not drive yourself to the hospital. Summary Vertigo is the feeling that you or your surroundings are moving when they are not. The Epley maneuver is an exercise that relieves symptoms of vertigo. If the Epley maneuver is done correctly, it is considered safe. This information is not intended to replace advice given to you by your health care provider. Make sure you discuss any questions you have with your health care provider. Document Revised: 10/24/2022 Document Reviewed: 10/24/2022 Elsevier Patient Education  2024 [employer].

## 2023-04-14 NOTE — Progress Notes (Addendum)
 Established Patient Office Visit  Subjective   Patient ID: Beverly Cole, female    DOB: 18-Dec-1960  Age: 63 y.o. MRN: [id number]  Chief Complaint  Patient presents with   Fatigue    Sxs started x1 week, pt was seen in the ED yesterday     HPI Discussed the use of AI scribe software for clinical note transcription with the patient, who gave verbal consent to proceed.  History of Present Illness   Beverly Cole is a 63 year old female who presents with dizziness and anemia. Her husband assists with transportation due to her dizziness.  She experiences episodes of dizziness, particularly when getting up or turning in bed. These episodes are not constant but occur under specific conditions. She was prescribed Antivert, which provided minimal relief. Due to the dizziness, she cannot drive, and her husband assists her with transportation.  Her hemoglobin levels have been dropping over the past few weeks, from 12.3 to 11.5, and most recently to 10.7. She cannot tolerate iron pills due to nausea, a condition her mother also experienced. She has not yet started a new iron supplement due to cost concerns.  She has a history of black tarry stools, which prompted a visit to the ER, but the stool test returned negative for blood. No stomach pain is reported, but she experiences heartburn, which is not more frequent than usual. She underwent a GI workup in 2023, including a colonoscopy, which was initially incomplete but later repeated successfully. She has not had an EGD since 2023.  She experiences a cough at night, which she attributes to reflux. She takes over-the-counter allergy medications, including Allegra and Flonase. She has been on Aciphex for stomach issues since her twenties, although she has not started the new prescription yet as she is finishing her current medication.  She has a history of pulmonary embolism and was concerned about her platelet count, which was slightly elevated at  405 but was reassured that it was not significantly abnormal. Her platelet count was normal when rechecked in the ER.      Patient Active Problem List   Diagnosis Date Noted   Iron deficiency anemia 04/16/2023   Acute left-sided low back pain without sciatica 04/29/2022   Polyp of colon 04/29/2022   Abnormal weight gain 07/17/2021   Preventative health care 07/17/2021   Diverticular disease of colon 07/17/2021   Dysphagia 07/17/2021   Family history of malignant neoplasm of digestive organs 07/17/2021   Second degree hemorrhoids 07/17/2021   Change in bowel habit 07/17/2021   Protein S deficiency (HCC) 12/10/2020   Thyroid nodule 12/10/2020   Other fatigue 12/10/2020   COVID-19 virus infection 07/16/2020   Chronic left shoulder pain 03/13/2020   RUQ pain 07/04/2019   Rosacea, acne 06/29/2019   Hyperlipidemia LDL goal <100 05/26/2017   Acute perforated appendicitis s/p lap appendectomy 03/17/2017 03/16/2017   History of pulmonary embolus - Dec 2017 03/16/2017   Lupus anticoagulant positive 03/16/2017   Irritable bowel syndrome (IBS) 03/16/2017   Anxiety and depression 03/16/2017   SOB (shortness of breath) 04/25/2016   Laryngopharyngeal reflux (LPR) 02/13/2016   Throat discomfort 02/13/2016   Chronic saddle pulmonary embolism with acute cor pulmonale (HCC)    Chest pain 02/09/2015   Severe recurrent major depression without psychotic features (HCC)    Goiter, nontoxic, multinodular 12/18/2014   Tremor 05/09/2012   SVT (supraventricular tachycardia) (HCC) 12/03/2011   Hypertension 08/26/2010   Memory loss 04/08/2010   Hyperlipidemia 03/13/2009  Bipolar 2 disorder (HCC) 03/13/2009   Hypothyroidism 07/06/2008   GERD 11/05/2006   ACNE NEC 11/05/2006   DEPRESSION 07/23/2006   Past Medical History:  Diagnosis Date   Anxiety    Atrial fibrillation (HCC)    a. Event monitor 2013 - PACs/bradycardia/SVT/short run of atrial fib by event monitor.    BIPOLAR DISORDER UNSPECIFIED     Bradycardia    Bursitis of hip 09/1999   Bilateral - Dr. Luiz Blare   DEPRESSION    Emphysema lung (HCC) 06/20/2016   pt states pulmonalongist stated started recently   GERD    Headache(784.0)    was with menstrual cycle. no longer a problem   HYPERLIPIDEMIA    Hypertension    Hypertension    IRRITABLE BOWEL SYNDROME, HX OF    Menopausal state 01/2012   [facility] = 88.5   Mitral valve prolapse 12/17/2000   a. dx 1980s, most recent echo did not demonstrate this.   MYALGIA    Normal coronary arteries    a. by cardiac CT 2013.   PAT (paroxysmal atrial tachycardia) (HCC)    PE (pulmonary embolism) 02/09/2015   a. Bilateral PEs 01/2015 when d-dimer 0.69, CP lying on left side. Followed by heme-onc - + lupus anticoagulant, mildly depressed protein S. Dr. Myna Hidalgo is not certain if her hypercoagulable studies are significant for a thrombophilic state.   Premature atrial contractions    Pulmonary embolus (HCC) 02/10/2015   Shortness of breath    Pulmonary eval 05/2002   Thyroid nodule 2014   Tremors of nervous system    Past Surgical History:  Procedure Laterality Date   COLONOSCOPY     LAPAROSCOPIC APPENDECTOMY N/A 03/17/2017   Procedure: APPENDECTOMY LAPAROSCOPIC;  Surgeon: Glenna Fellows, MD;  Location: [facility];  Service: General;  Laterality: N/A;   Lipoma (R) side  1990   Right back   Social History   Tobacco Use   Smoking status: Former    Current packs/day: 0.00    Average packs/day: 1 pack/day for 25.0 years (25.0 ttl pk-yrs)    Types: Cigarettes    Start date: 02/11/1971    Quit date: 02/11/1996    Years since quitting: 27.1   Smokeless tobacco: Never   Tobacco comments:    Married, lives with spouse. 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  Vaping Use   Vaping status: Never Used  Substance Use Topics   Alcohol use: Yes    Comment: occ   Drug use: No   Social History   Socioeconomic History   Marital status: Married    Spouse name: Not on file   Number of  children: 0   Years of education: Not on file   Highest education level: Associate degree: academic program  Occupational History   Occupation: Nurse-Personal Care/HH    Employer: UNEMPLOYED  Tobacco Use   Smoking status: Former    Current packs/day: 0.00    Average packs/day: 1 pack/day for 25.0 years (25.0 ttl pk-yrs)    Types: Cigarettes    Start date: 02/11/1971    Quit date: 02/11/1996    Years since quitting: 27.1   Smokeless tobacco: Never   Tobacco comments:    Married, lives with spouse. 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       No exercise   Social Drivers of Health   Financial Resource Strain: Low Risk  (04/11/2023)   Overall Financial Resource Strain (CARDIA)    Difficulty of Paying Living Expenses: Not hard at all  Food Insecurity: No Food Insecurity (04/11/2023)   Hunger Vital Sign    Worried About Running Out of Food in the Last Year: Never true    Ran Out of Food in the Last Year: Never true  Transportation Needs: Unmet Transportation Needs (04/11/2023)   PRAPARE - Administrator, Civil Service (Medical): Yes    Lack of Transportation (Non-Medical): No  Physical Activity: Unknown (04/11/2023)   Exercise Vital Sign    Days of Exercise per Week: 0 days    Minutes of Exercise per Session: Not on file  Stress: Stress Concern Present (04/11/2023)   [employer] of Occupational Health - Occupational Stress Questionnaire    Feeling of Stress : Very much  Social Connections: Moderately Isolated (04/11/2023)   Social Connection and Isolation Panel [NHANES]    Frequency of Communication with Friends and Family: Never    Frequency of Social  Gatherings with Friends and Family: Never    Attends Religious Services: 1 to 4 times per year    Active Member of [employer] or Organizations: No    Attends Engineer, structural: Not on file    Marital Status: Married  Catering manager Violence: Not on file   Family Status  Relation Name Status   Mother  Deceased       lung CA   Father  Deceased       esophageal CA   Brother  Alive       testicular CA   Nutritional therapist  (Not Specified)   Other  (Not Specified)  No partnership data on file   Family History  Problem Relation Age of Onset   Lung cancer Mother    Hypertension Mother    Hyperlipidemia Mother    Cancer Father        Esophageal cancer   Hyperlipidemia Father    Depression Father    Cancer Brother    Pulmonary embolism Brother    Colon cancer Paternal Uncle    Sarcoidosis Other    Testicular cancer Other    Allergies  Allergen Reactions   Cariprazine Other (See Comments)    Patient becomes suicidal when taking this medication.  Patient becomes suicidal when taking this medication.    Metoprolol Other (See Comments)    Hallucinations hair falls out Hallucinations hair falls out   Nickel Rash   Iron Nausea And Vomiting      Review of Systems  Constitutional:  Negative for chills, fever and malaise/fatigue.  HENT:  Negative for congestion and hearing loss.   Eyes:  Negative for blurred vision and discharge.  Respiratory:  Negative for cough, sputum production and shortness of breath.   Cardiovascular:  Negative for chest pain, palpitations and leg swelling.  Gastrointestinal:  Negative for abdominal pain, blood in stool, constipation, diarrhea, heartburn, nausea and vomiting.  Genitourinary:  Negative for dysuria, frequency, hematuria and urgency.  Musculoskeletal:  Negative for back pain, falls and myalgias.  Skin:  Negative for rash.  Neurological:  Negative for dizziness, sensory change, loss of consciousness, weakness and headaches.   Endo/Heme/Allergies:  Negative for environmental  allergies. Does not bruise/bleed easily.  Psychiatric/Behavioral:  Negative for depression and suicidal ideas. The patient is not nervous/anxious and does not have insomnia.       Objective:     BP 110/70 (BP Location: Left Arm, Patient Position: Sitting, Cuff Size: Normal)   Pulse 78   Temp 98.6 F (37 C) (Oral)   Resp 18   Ht 5\' 9"  (1.753 m)   Wt 174 lb 3.2 oz (79 kg)   LMP 05/11/2012   SpO2 96%   BMI 25.72 kg/m  BP Readings from Last 3 Encounters:  04/14/23 110/70  04/13/23 130/68  03/23/23 110/78   Wt Readings from Last 3 Encounters:  04/14/23 174 lb 3.2 oz (79 kg)  04/13/23 170 lb (77.1 kg)  03/23/23 170 lb 3.2 oz (77.2 kg)   SpO2 Readings from Last 3 Encounters:  04/14/23 96%  04/13/23 100%  03/23/23 95%      Physical Exam Vitals and nursing note reviewed.  Constitutional:      General: She is not in acute distress.    Appearance: Normal appearance. She is well-developed.  HENT:     Head: Normocephalic and atraumatic.     Right Ear: Tympanic membrane, ear canal and external ear normal. There is no impacted cerumen.     Left Ear: Tympanic membrane, ear canal and external ear normal. There is no impacted cerumen.     Nose: Nose normal.     Mouth/Throat:     Mouth: Mucous membranes are moist.     Pharynx: Oropharynx is clear. No oropharyngeal exudate or posterior oropharyngeal erythema.  Eyes:     General: No scleral icterus.       Right eye: No discharge.        Left eye: No discharge.     Conjunctiva/sclera: Conjunctivae normal.     Pupils: Pupils are equal, round, and reactive to light.  Neck:     Thyroid: No thyromegaly or thyroid tenderness.     Vascular: No JVD.  Cardiovascular:     Rate and Rhythm: Normal rate and regular rhythm.     Heart sounds: Normal heart sounds. No murmur heard. Pulmonary:     Effort: Pulmonary effort is normal. No respiratory distress.     Breath sounds: Normal breath  sounds.  Abdominal:     General: Bowel sounds are normal. There is no distension.     Palpations: Abdomen is soft. There is no mass.     Tenderness: There is no abdominal tenderness. There is no guarding or rebound.  Genitourinary:    Vagina: Normal.  Musculoskeletal:        General: Normal range of motion.     Cervical back: Normal range of motion and neck supple.     Right lower leg: No edema.     Left lower leg: No edema.  Lymphadenopathy:     Cervical: No cervical adenopathy.  Skin:    General: Skin is warm and dry.     Findings: No erythema or rash.  Neurological:     Mental Status: She is alert and oriented to person, place, and time.     Cranial Nerves: No cranial nerve deficit.     Deep Tendon Reflexes: Reflexes are normal and symmetric.  Psychiatric:        Mood and Affect: Mood normal.        Behavior: Behavior normal.        Thought Content: Thought content normal.  Judgment: Judgment normal.      Results for orders placed or performed in visit on 04/14/23  CBC with Differential/Platelet  Result Value Ref Range   WBC 7.1 4.0 - 10.5 K/uL   RBC 4.94 3.87 - 5.11 Mil/uL   Hemoglobin 11.1 (L) 12.0 - 15.0 g/dL   HCT 10.9 60.4 - 54.0 %   MCV 73.4 (L) 78.0 - 100.0 fl   MCHC 30.7 30.0 - 36.0 g/dL   RDW 98.1 (H) 19.1 - 47.8 %   Platelets 385.0 150.0 - 400.0 K/uL   Neutrophils Relative % 63.1 43.0 - 77.0 %   Lymphocytes Relative 29.2 12.0 - 46.0 %   Monocytes Relative 5.6 3.0 - 12.0 %   Eosinophils Relative 1.6 0.0 - 5.0 %   Basophils Relative 0.5 0.0 - 3.0 %   Neutro Abs 4.5 1.4 - 7.7 K/uL   Lymphs Abs 2.1 0.7 - 4.0 K/uL   Monocytes Absolute 0.4 0.1 - 1.0 K/uL   Eosinophils Absolute 0.1 0.0 - 0.7 K/uL   Basophils Absolute 0.0 0.0 - 0.1 K/uL    Last CBC Lab Results  Component Value Date   WBC 7.1 04/14/2023   HGB 11.1 (L) 04/14/2023   HCT 36.3 04/14/2023   MCV 73.4 (L) 04/14/2023   MCH 22.8 (L) 04/13/2023   RDW 18.9 (H) 04/14/2023   PLT 385.0  04/14/2023   Last metabolic panel Lab Results  Component Value Date   GLUCOSE 124 (H) 04/13/2023   NA 136 04/13/2023   K 4.1 04/13/2023   CL 106 04/13/2023   CO2 18 (L) 04/13/2023   BUN 14 04/13/2023   CREATININE 0.82 04/13/2023   GFRNONAA >60 04/13/2023   CALCIUM 9.4 04/13/2023   PROT 7.4 04/13/2023   ALBUMIN 3.7 04/13/2023   LABGLOB 2.6 04/05/2020   AGRATIO 1.8 04/05/2020   BILITOT 0.3 04/13/2023   ALKPHOS 71 04/13/2023   AST 22 04/13/2023   ALT 22 04/13/2023   ANIONGAP 12 04/13/2023   Last lipids Lab Results  Component Value Date   CHOL 253 (H) 03/23/2023   HDL 46.50 03/23/2023   LDLCALC 163 (H) 03/23/2023   LDLDIRECT 200.0 04/29/2022   TRIG 217.0 (H) 03/23/2023   CHOLHDL 5 03/23/2023   Last hemoglobin A1c Lab Results  Component Value Date   HGBA1C 5.3 03/20/2009   Last thyroid functions Lab Results  Component Value Date   TSH 2.89 03/23/2023   T4TOTAL 7.5 12/04/2020   Last vitamin D Lab Results  Component Value Date   VD25OH 28.95 (L) 03/23/2023   Last vitamin B12 and Folate Lab Results  Component Value Date   VITAMINB12 407 03/23/2023      The 10-year ASCVD risk score (Arnett DK, et al., 2019) is: 5.7%    Assessment & Plan:   Problem List Items Addressed This Visit       Unprioritized   Iron deficiency anemia - Primary   Pt does not tolerate PO iron Recheck labs  Consider hematology referral Lab Results  Component Value Date   HGB 11.1 (L) 04/14/2023         Relevant Medications   Ferric Maltol (ACCRUFER) 30 MG CAPS   Other Relevant Orders   CBC with Differential/Platelet (Completed)  Assessment and Plan    Dizziness   She experiences episodic dizziness, particularly when standing or turning in bed, possibly due to inner ear issues or anemia. Displaced inner ear crystals may be the cause, so the Epley maneuver is suggested as a home  exercise. Anemia is also considered, though hemoglobin levels are not critically low. Epley  maneuver instructions are provided in the chart. Reassess dizziness symptoms if there is no improvement.  Anemia   Her hemoglobin levels have decreased from 12.3 to 10.7 over the past weeks. She reports black tarry stools, but recent stool tests were negative for blood. She cannot tolerate traditional iron supplements due to gastrointestinal side effects. A new iron supplement with potentially fewer GI side effects was discussed. If hemoglobin continues to decline, consider referral for iron infusion to bypass gastrointestinal side effects. Order a CBC to evaluate hemoglobin levels and prescribe the new iron supplement. Consider referral for iron infusion if hemoglobin decreases further.  Gastroesophageal Reflux Disease (GERD)   GERD is managed with Aciphex. She experiences a nighttime cough, possibly related to reflux, and has not started the new Aciphex prescription as she is finishing the previous medication. Continue Aciphex for GERD management and monitor symptoms, adjusting treatment if necessary.        Return if symptoms worsen or fail to improve.    Donato Schultz, DO

## 2023-04-15 ENCOUNTER — Other Ambulatory Visit: Payer: Self-pay

## 2023-04-15 ENCOUNTER — Encounter: Payer: Self-pay | Admitting: Family Medicine

## 2023-04-15 DIAGNOSIS — D509 Iron deficiency anemia, unspecified: Secondary | ICD-10-CM

## 2023-04-15 LAB — CBC WITH DIFFERENTIAL/PLATELET
Basophils Absolute: 0 10*3/uL (ref 0.0–0.1)
Basophils Relative: 0.5 % (ref 0.0–3.0)
Eosinophils Absolute: 0.1 10*3/uL (ref 0.0–0.7)
Eosinophils Relative: 1.6 % (ref 0.0–5.0)
HCT: 36.3 % (ref 36.0–46.0)
Hemoglobin: 11.1 g/dL — ABNORMAL LOW (ref 12.0–15.0)
Lymphocytes Relative: 29.2 % (ref 12.0–46.0)
Lymphs Abs: 2.1 10*3/uL (ref 0.7–4.0)
MCHC: 30.7 g/dL (ref 30.0–36.0)
MCV: 73.4 fl — ABNORMAL LOW (ref 78.0–100.0)
Monocytes Absolute: 0.4 10*3/uL (ref 0.1–1.0)
Monocytes Relative: 5.6 % (ref 3.0–12.0)
Neutro Abs: 4.5 10*3/uL (ref 1.4–7.7)
Neutrophils Relative %: 63.1 % (ref 43.0–77.0)
Platelets: 385 10*3/uL (ref 150.0–400.0)
RBC: 4.94 Mil/uL (ref 3.87–5.11)
RDW: 18.9 % — ABNORMAL HIGH (ref 11.5–15.5)
WBC: 7.1 10*3/uL (ref 4.0–10.5)

## 2023-04-16 ENCOUNTER — Ambulatory Visit: Payer: 59 | Admitting: Psychology

## 2023-04-16 ENCOUNTER — Encounter: Payer: Self-pay | Admitting: Family Medicine

## 2023-04-16 DIAGNOSIS — F331 Major depressive disorder, recurrent, moderate: Secondary | ICD-10-CM | POA: Diagnosis not present

## 2023-04-16 DIAGNOSIS — D509 Iron deficiency anemia, unspecified: Secondary | ICD-10-CM | POA: Insufficient documentation

## 2023-04-16 NOTE — Assessment & Plan Note (Signed)
 Pt does not tolerate PO iron Recheck labs  Consider hematology referral Lab Results  Component Value Date   HGB 11.1 (L) 04/14/2023

## 2023-04-16 NOTE — Progress Notes (Signed)
 [facility] Counselor/Therapist Progress Note  Patient ID: Beverly Cole, MRN: [id number],    Date: 04/16/2023  Time Spent: 3:00pm-3:50pm   50 minutes   Treatment Type: Individual Therapy  Reported Symptoms: stress  Mental Status Exam: Appearance:  Casual     Behavior: Appropriate  Motor: Normal  Speech/Language:  Normal Rate  Affect: Appropriate  Mood: normal  Thought process: normal  Thought content:   WNL  Sensory/Perceptual disturbances:   WNL  Orientation: oriented to person, place, time/date, and situation  Attention: Good  Concentration: Good  Memory: WNL  Fund of knowledge:  Good  Insight:   Good  Judgment:  Good  Impulse Control: Good   Risk Assessment: Danger to Self:  No Self-injurious Behavior: No Danger to Others: No Duty to Warn:no Physical Aggression / Violence:No  Access to Firearms a concern: No  Gang Involvement:No   Subjective: Pt present for face-to-face individual therapy via video.  Pt consents to telehealth video session and is aware of limitations and benefits of virtual sessions.  Location of pt: home Location of therapist: home office.  Pt talked about being in the ER Monday bc she was dizzy and weak.  She was given IV fluids and the EKG showed she may have had a heart attack in the past.  Pt followed up with her PCP.   Pt feels like she does not have many more years to live.  Pt has had abnormal blood work she is worried about.   Pt is trying to eat better.   She only eats one meal a day but tries to make it nutritious.   Addressed pt's worries about her health.   Pt talked about [name] being offered another promotion.  [name] will be a Interior and spatial designer.  Pt is glad that [name] is so happy with his job now.  However, there are a lot of changes at [facility] [employer] now and that has been stressful for [name].  Pt is worried that [name] will be working longer hours.  Pt states [name] has prostrate cancer.   He doesn't need treatment for it but gets tired at times.   Pt talked about her 69 yo cousin dying.  Pt's other cousin has cancer in her breast, bone and brain.  She is in bad pain which worries pt.  Helped pt process her feelings and grief. Pt talked about politics and the state of the country and world.   Addressed pt's worries and concerns.   Worked on self care strategies. Provided supportive therapy.    Interventions: Cognitive Behavioral Therapy and Insight-Oriented  Diagnosis:F33.1  Plan of Care: Recommend ongoing therapy.   Pt participated in setting treatment goals.  Plan to meet monthly.  Pt agrees with treatment plan.  Treatment Plan Client Abilities/Strengths  Pt is bright, engaging, and motivated for therapy.   Client Treatment Preferences  Individual therapy.  Client Statement of Needs  Improve coping skills.  Symptoms  Depressed or irritable mood. Diminished interest in or enjoyment of activities. Lack of energy. Poor concentration and indecisiveness. Social withdrawal.  Problems Addressed  Unipolar Depression Goals 1. Alleviate depressive symptoms and return to previous level of effective functioning. 2. Appropriately grieve the loss in order to normalize mood and to return to previously adaptive level of functioning. Objective Learn and implement behavioral strategies to overcome depression. Target Date: 2023-12-02 Frequency: monthly  Progress: 55 Modality: individual  Related Interventions Engage the client in "behavioral activation," increasing his/her activity level and contact with sources of reward,  while identifying processes that inhibit activation.  Use behavioral techniques such as instruction, rehearsal, role-playing, role reversal, as needed, to facilitate activity in the client's daily life; reinforce success. Assist the client in developing skills that increase the likelihood of deriving pleasure from behavioral activation (e.g., assertiveness skills, developing an exercise plan, less internal/more external  focus, increased social involvement); reinforce success. Objective Identify important people in life, past and present, and describe the quality, good and poor, of those relationships. Target Date: 2023-12-02 Frequency: monthly  Progress: 55 Modality: individual  Related Interventions Conduct Interpersonal Therapy beginning with the assessment of the client's "interpersonal inventory" of important past and present relationships; develop a case formulation linking depression to grief, interpersonal role disputes, role transitions, and/or interpersonal deficits). Objective Learn and implement problem-solving and decision-making skills. Target Date: 2023-12-02 Frequency: monthly  Progress: 55 Modality: individual  Related Interventions Conduct Problem-Solving Therapy using techniques such as psychoeducation, modeling, and role-playing to teach client problem-solving skills (i.e., defining a problem specifically, generating possible solutions, evaluating the pros and cons of each solution, selecting and implementing a plan of action, evaluating the efficacy of the plan, accepting or revising the plan); role-play application of the problem-solving skill to a real life issue. Encourage in the client the development of a positive problem orientation in which problems and solving them are viewed as a natural part of life and not something to be feared, despaired, or avoided. 3. Develop healthy interpersonal relationships that lead to the alleviation and help prevent the relapse of depression. 4. Develop healthy thinking patterns and beliefs about self, others, and the world that lead to the alleviation and help prevent the relapse of depression. 5. Recognize, accept, and cope with feelings of depression. Diagnosis F33.1  Conditions For Discharge Achievement of treatment goals and objectives   Salomon Fick, LCSW

## 2023-04-20 ENCOUNTER — Encounter: Payer: Self-pay | Admitting: Family Medicine

## 2023-04-20 ENCOUNTER — Encounter: Payer: Self-pay | Admitting: Cardiovascular Disease

## 2023-04-23 ENCOUNTER — Other Ambulatory Visit: Payer: Self-pay | Admitting: Psychiatry

## 2023-04-23 ENCOUNTER — Encounter (HOSPITAL_BASED_OUTPATIENT_CLINIC_OR_DEPARTMENT_OTHER): Payer: Self-pay

## 2023-04-23 DIAGNOSIS — G2581 Restless legs syndrome: Secondary | ICD-10-CM

## 2023-04-23 MED ORDER — PRAMIPEXOLE DIHYDROCHLORIDE 1 MG PO TABS
ORAL_TABLET | ORAL | 0 refills | Status: DC
  Filled 2023-04-23: qty 30, 30d supply, fill #0

## 2023-04-24 ENCOUNTER — Other Ambulatory Visit (HOSPITAL_COMMUNITY): Payer: Self-pay

## 2023-04-24 ENCOUNTER — Other Ambulatory Visit (HOSPITAL_BASED_OUTPATIENT_CLINIC_OR_DEPARTMENT_OTHER): Payer: Self-pay

## 2023-04-30 ENCOUNTER — Other Ambulatory Visit (HOSPITAL_COMMUNITY): Payer: Self-pay

## 2023-04-30 ENCOUNTER — Ambulatory Visit: Payer: 59 | Admitting: Family Medicine

## 2023-04-30 ENCOUNTER — Encounter: Payer: Self-pay | Admitting: Family Medicine

## 2023-04-30 VITALS — BP 100/80 | HR 79 | Temp 98.1°F | Resp 18 | Ht 69.0 in | Wt 172.6 lb

## 2023-04-30 DIAGNOSIS — I10 Essential (primary) hypertension: Secondary | ICD-10-CM

## 2023-04-30 DIAGNOSIS — D509 Iron deficiency anemia, unspecified: Secondary | ICD-10-CM

## 2023-04-30 DIAGNOSIS — Z0000 Encounter for general adult medical examination without abnormal findings: Secondary | ICD-10-CM

## 2023-04-30 DIAGNOSIS — T7840XA Allergy, unspecified, initial encounter: Secondary | ICD-10-CM | POA: Diagnosis not present

## 2023-04-30 DIAGNOSIS — E785 Hyperlipidemia, unspecified: Secondary | ICD-10-CM | POA: Diagnosis not present

## 2023-04-30 DIAGNOSIS — F3181 Bipolar II disorder: Secondary | ICD-10-CM

## 2023-04-30 MED ORDER — AZELASTINE HCL 0.1 % NA SOLN
1.0000 | Freq: Two times a day (BID) | NASAL | 12 refills | Status: AC
  Filled 2023-04-30: qty 30, 30d supply, fill #0
  Filled 2023-06-17: qty 30, 30d supply, fill #1
  Filled 2023-08-17: qty 30, 30d supply, fill #2
  Filled 2023-09-13: qty 30, 30d supply, fill #3
  Filled 2023-10-30: qty 30, 30d supply, fill #4
  Filled 2023-12-11: qty 30, 30d supply, fill #5
  Filled 2024-01-24: qty 30, 50d supply, fill #6

## 2023-04-30 NOTE — Progress Notes (Signed)
 Established Patient Office Visit  Subjective   Patient ID: Beverly Cole, female    DOB: 02-May-1960  Age: 63 y.o. MRN: [id number]  Chief Complaint  Patient presents with   Annual Exam    Pt states not fasting .    HPI Discussed the use of AI scribe software for clinical note transcription with the patient, who gave verbal consent to proceed.  History of Present Illness   Beverly Cole is a 63 year old female who presents with weakness, fatigue, and hot flashes.  She has been experiencing weakness and fatigue since November, which significantly impacts her quality of life. She feels persistently tired and expresses frustration, stating, 'I've just had enough.'  She is experiencing unintentional weight loss, which she attributes to a change in medication. Her current dietary habit involves eating one meal a day, a pattern developed over years due to previous weight gain issues while on medication.  Hot flashes have returned recently, adding to her discomfort.  She is taking iron supplements, which do not cause gastrointestinal issues, but she is uncertain about their effectiveness. There was a recent change in her iron supplement, facilitated by her pharmacist to reduce cost.  She experiences significant allergy symptoms, including red eyes, due to high pollen levels. She uses Allegra and Flonase for her allergies and has Nasacort at home. She is interested in trying additional treatments for her allergies.  She reports bursitis in her left hip, exacerbated by spending more time in bed, and describes feeling achy in different places.  She mentions urinary frequency and urgency, noting a change from her previous ability to hold urine for extended periods. She has not experienced incontinence but needs to remind herself to urinate more frequently.      Patient Active Problem List   Diagnosis Date Noted   Iron deficiency anemia 04/16/2023   Acute left-sided low back pain without  sciatica 04/29/2022   Polyp of colon 04/29/2022   Abnormal weight gain 07/17/2021   Preventative health care 07/17/2021   Diverticular disease of colon 07/17/2021   Dysphagia 07/17/2021   Family history of malignant neoplasm of digestive organs 07/17/2021   Second degree hemorrhoids 07/17/2021   Change in bowel habit 07/17/2021   Protein S deficiency (HCC) 12/10/2020   Thyroid nodule 12/10/2020   Other fatigue 12/10/2020   COVID-19 virus infection 07/16/2020   Chronic left shoulder pain 03/13/2020   RUQ pain 07/04/2019   Rosacea, acne 06/29/2019   Hyperlipidemia LDL goal <100 05/26/2017   Acute perforated appendicitis s/p lap appendectomy 03/17/2017 03/16/2017   History of pulmonary embolus - Dec 2017 03/16/2017   Lupus anticoagulant positive 03/16/2017   Irritable bowel syndrome (IBS) 03/16/2017   Anxiety and depression 03/16/2017   SOB (shortness of breath) 04/25/2016   Laryngopharyngeal reflux (LPR) 02/13/2016   Throat discomfort 02/13/2016   Chronic saddle pulmonary embolism with acute cor pulmonale (HCC)    Chest pain 02/09/2015   Severe recurrent major depression without psychotic features (HCC)    Goiter, nontoxic, multinodular 12/18/2014   Tremor 05/09/2012   SVT (supraventricular tachycardia) (HCC) 12/03/2011   Hypertension 08/26/2010   Memory loss 04/08/2010   Hyperlipidemia 03/13/2009   Bipolar 2 disorder (HCC) 03/13/2009   Hypothyroidism 07/06/2008   GERD 11/05/2006   ACNE NEC 11/05/2006   DEPRESSION 07/23/2006   Past Medical History:  Diagnosis Date   Anxiety    Atrial fibrillation (HCC)    a. Event monitor 2013 - PACs/bradycardia/SVT/short run of atrial fib by  event monitor.    BIPOLAR DISORDER UNSPECIFIED    Bradycardia    Bursitis of hip 09/1999   Bilateral - Dr. Luiz Blare   DEPRESSION    Emphysema lung (HCC) 06/20/2016   pt states pulmonalongist stated started recently   GERD    Headache(784.0)    was with menstrual cycle. no longer a problem    HYPERLIPIDEMIA    Hypertension    Hypertension    IRRITABLE BOWEL SYNDROME, HX OF    Menopausal state 01/2012   [facility] = 88.5   Mitral valve prolapse 12/17/2000   a. dx 1980s, most recent echo did not demonstrate this.   MYALGIA    Normal coronary arteries    a. by cardiac CT 2013.   PAT (paroxysmal atrial tachycardia) (HCC)    PE (pulmonary embolism) 02/09/2015   a. Bilateral PEs 01/2015 when d-dimer 0.69, CP lying on left side. Followed by heme-onc - + lupus anticoagulant, mildly depressed protein S. Dr. Myna Hidalgo is not certain if her hypercoagulable studies are significant for a thrombophilic state.   Premature atrial contractions    Pulmonary embolus (HCC) 02/10/2015   Shortness of breath    Pulmonary eval 05/2002   Thyroid nodule 2014   Tremors of nervous system    Past Surgical History:  Procedure Laterality Date   COLONOSCOPY     LAPAROSCOPIC APPENDECTOMY N/A 03/17/2017   Procedure: APPENDECTOMY LAPAROSCOPIC;  Surgeon: Glenna Fellows, MD;  Location: [facility];  Service: General;  Laterality: N/A;   Lipoma (R) side  1990   Right back   Social History   Tobacco Use   Smoking status: Former    Current packs/day: 0.00    Average packs/day: 1 pack/day for 25.0 years (25.0 ttl pk-yrs)    Types: Cigarettes    Start date: 02/11/1971    Quit date: 02/11/1996    Years since quitting: 27.2   Smokeless tobacco: Never   Tobacco comments:    Married, lives with spouse. 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  Vaping Use   Vaping status: Never Used  Substance Use Topics   Alcohol use: Yes    Comment: occ   Drug use: No   Social History   Socioeconomic History   Marital status: Married    Spouse name: Not on file   Number of children: 0   Years of education: Not on file   Highest education level: Associate degree: academic program  Occupational History   Occupation: Nurse-Personal Care/HH    Employer: UNEMPLOYED  Tobacco Use   Smoking status: Former    Current packs/day:  0.00    Average packs/day: 1 pack/day for 25.0 years (25.0 ttl pk-yrs)    Types: Cigarettes    Start date: 02/11/1971    Quit date: 02/11/1996    Years since quitting: 27.2   Smokeless tobacco: Never   Tobacco comments:    Married, lives with spouse. 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       No exercise   Social Drivers of Health   Financial Resource Strain: Low Risk  (04/11/2023)   Overall  Financial Resource Strain (CARDIA)    Difficulty of Paying Living Expenses: Not hard at all  Food Insecurity: No Food Insecurity (04/11/2023)   Hunger Vital Sign    Worried About Running Out of Food in the Last Year: Never true    Ran Out of Food in the Last Year: Never true  Transportation Needs: Unmet Transportation Needs (04/11/2023)   PRAPARE - Administrator, Civil Service (Medical): Yes    Lack of Transportation (Non-Medical): No  Physical Activity: Unknown (04/11/2023)   Exercise Vital Sign    Days of Exercise per Week: 0 days    Minutes of Exercise per Session: Not on file  Stress: Stress Concern Present (04/11/2023)   [employer] of Occupational Health - Occupational Stress Questionnaire    Feeling of Stress : Very much  Social Connections: Moderately Isolated (04/11/2023)   Social Connection and Isolation Panel [NHANES]    Frequency of Communication with Friends and Family: Never    Frequency of Social Gatherings with Friends and Family: Never    Attends Religious Services: 1 to 4 times per year    Active Member of [employer] or Organizations: No    Attends Engineer, structural: Not on file    Marital Status: Married  Catering manager Violence: Not  on file   Family Status  Relation Name Status   Mother  Deceased       lung CA   Father  Deceased       esophageal CA   Brother  Alive       testicular CA   Nutritional therapist  (Not Specified)   Other  (Not Specified)  No partnership data on file   Family History  Problem Relation Age of Onset   Lung cancer Mother    Hypertension Mother    Hyperlipidemia Mother    Cancer Father        Esophageal cancer   Hyperlipidemia Father    Depression Father    Cancer Brother    Pulmonary embolism Brother    Colon cancer Paternal Uncle    Sarcoidosis Other    Testicular cancer Other    Allergies  Allergen Reactions   Cariprazine Other (See Comments)    Patient becomes suicidal when taking this medication.  Patient becomes suicidal when taking this medication.    Metoprolol Other (See Comments)    Hallucinations hair falls out Hallucinations hair falls out   Nickel Rash   Iron Nausea And Vomiting      Review of Systems  Constitutional:  Negative for fever and malaise/fatigue.  HENT:  Negative for congestion.   Eyes:  Negative for blurred vision.  Respiratory:  Negative for shortness of breath.   Cardiovascular:  Negative for chest pain, palpitations and leg swelling.  Gastrointestinal:  Negative for abdominal pain, blood in stool and nausea.  Genitourinary:  Negative for dysuria and frequency.  Musculoskeletal:  Negative for falls.  Skin:  Negative for rash.  Neurological:  Negative for dizziness, loss of consciousness and headaches.  Endo/Heme/Allergies:  Negative for environmental allergies.  Psychiatric/Behavioral:  Negative for depression. The patient is not nervous/anxious.       Objective:     BP 100/80 (BP Location: Left Arm, Patient Position: Sitting, Cuff Size: Normal)   Pulse 79   Temp 98.1 F (36.7 C) (Oral)   Resp 18   Ht 5\' 9"  (1.753 m)   Wt 172 lb 9.6 oz (78.3 kg)   LMP 05/11/2012   SpO2  96%   BMI 25.49 kg/m  BP Readings from Last 3 Encounters:   04/30/23 100/80  04/14/23 110/70  04/13/23 130/68   Wt Readings from Last 3 Encounters:  04/30/23 172 lb 9.6 oz (78.3 kg)  04/14/23 174 lb 3.2 oz (79 kg)  04/13/23 170 lb (77.1 kg)   SpO2 Readings from Last 3 Encounters:  04/30/23 96%  04/14/23 96%  04/13/23 100%      Physical Exam   No results found for any visits on 04/30/23.  Last CBC Lab Results  Component Value Date   WBC 10.5 04/30/2023   HGB 11.7 (L) 04/30/2023   HCT 38.4 04/30/2023   MCV 74.2 (L) 04/30/2023   MCH 22.8 (L) 04/13/2023   RDW 19.2 (H) 04/30/2023   PLT 391.0 04/30/2023   Last metabolic panel Lab Results  Component Value Date   GLUCOSE 100 (H) 04/30/2023   NA 141 04/30/2023   K 4.3 04/30/2023   CL 104 04/30/2023   CO2 26 04/30/2023   BUN 15 04/30/2023   CREATININE 0.87 04/30/2023   GFR 70.90 04/30/2023   CALCIUM 9.9 04/30/2023   PROT 7.3 04/30/2023   ALBUMIN 4.5 04/30/2023   LABGLOB 2.6 04/05/2020   AGRATIO 1.8 04/05/2020   BILITOT 0.3 04/30/2023   ALKPHOS 91 04/30/2023   AST 21 04/30/2023   ALT 20 04/30/2023   ANIONGAP 12 04/13/2023   Last lipids Lab Results  Component Value Date   CHOL 240 (H) 04/30/2023   HDL 46.80 04/30/2023   LDLCALC 147 (H) 04/30/2023   LDLDIRECT 200.0 04/29/2022   TRIG 231.0 (H) 04/30/2023   CHOLHDL 5 04/30/2023   Last hemoglobin A1c Lab Results  Component Value Date   HGBA1C 5.3 03/20/2009   Last thyroid functions Lab Results  Component Value Date   TSH 2.23 04/30/2023   T4TOTAL 7.5 12/04/2020   Last vitamin D Lab Results  Component Value Date   VD25OH 28.95 (L) 03/23/2023   Last vitamin B12 and Folate Lab Results  Component Value Date   VITAMINB12 407 03/23/2023      The 10-year ASCVD risk score (Arnett DK, et al., 2019) is: 4.7%    Assessment & Plan:   Problem List Items Addressed This Visit       Unprioritized   Preventative health care   Relevant Orders   CBC with Differential/Platelet   Comprehensive metabolic panel    Lipid panel   TSH   Hypertension   Hyperlipidemia   Bipolar 2 disorder (HCC)   Iron deficiency anemia - Primary   Relevant Orders   IBC panel   Other Visit Diagnoses       Allergy, initial encounter       Relevant Medications   azelastine (ASTELIN) 0.1 % nasal spray     Assessment and Plan    Fatigue and Weakness   She reports persistent fatigue and weakness since November, significantly affecting her quality of life, along with unintentional weight loss attributed to a change in medication. Distressed by these chronic symptoms, she is taking iron supplements without gastrointestinal side effects. Order blood work to assess iron levels, hemoglobin, and hematocrit.  Allergic Rhinitis   She experiences severe allergic symptoms, including ocular redness, due to high pollen levels. Currently on Allegra and Flonase, an antihistamine nasal spray such as Astepro or Astelin is recommended for improved symptom control. If symptoms persist, consider referral to an allergist and discuss potential for immunotherapy.  Bursitis   She reports left hip bursitis exacerbated by  prolonged bed rest, contributing to her discomfort.  Urinary Frequency and Urgency   She experiences increased urinary frequency and urgency, a change from her previous continence. Referred to urogynecology but not yet evaluated, she manages symptoms by adjusting her behavior to accommodate urgency. Follow up on [facility].  General Health Maintenance   She has not had a recent mammogram and her gynecologist has retired. Acknowledging the importance of scheduling a mammogram, especially given a recent family diagnosis of breast cancer, encourage scheduling with a new gynecologist.        No follow-ups on file.    Donato Schultz, DO

## 2023-05-01 ENCOUNTER — Encounter: Payer: Self-pay | Admitting: Family Medicine

## 2023-05-01 ENCOUNTER — Other Ambulatory Visit (HOSPITAL_COMMUNITY): Payer: Self-pay

## 2023-05-01 DIAGNOSIS — D509 Iron deficiency anemia, unspecified: Secondary | ICD-10-CM

## 2023-05-01 DIAGNOSIS — R5383 Other fatigue: Secondary | ICD-10-CM

## 2023-05-01 LAB — LIPID PANEL
Cholesterol: 240 mg/dL — ABNORMAL HIGH (ref 0–200)
HDL: 46.8 mg/dL (ref >39.00)
LDL Cholesterol: 147 mg/dL — ABNORMAL HIGH (ref 0–99)
NonHDL: 193.43
Total CHOL/HDL Ratio: 5
Triglycerides: 231 mg/dL — ABNORMAL HIGH (ref 0.0–149.0)
VLDL: 46.2 mg/dL — ABNORMAL HIGH (ref 0.0–40.0)

## 2023-05-01 LAB — CBC WITH DIFFERENTIAL/PLATELET
Basophils Absolute: 0.1 10*3/uL (ref 0.0–0.1)
Basophils Relative: 1 % (ref 0.0–3.0)
Eosinophils Absolute: 0.2 10*3/uL (ref 0.0–0.7)
Eosinophils Relative: 1.7 % (ref 0.0–5.0)
HCT: 38.4 % (ref 36.0–46.0)
Hemoglobin: 11.7 g/dL — ABNORMAL LOW (ref 12.0–15.0)
Lymphocytes Relative: 18 % (ref 12.0–46.0)
Lymphs Abs: 1.9 10*3/uL (ref 0.7–4.0)
MCHC: 30.3 g/dL (ref 30.0–36.0)
MCV: 74.2 fl — ABNORMAL LOW (ref 78.0–100.0)
Monocytes Absolute: 0.6 10*3/uL (ref 0.1–1.0)
Monocytes Relative: 5.3 % (ref 3.0–12.0)
Neutro Abs: 7.7 10*3/uL (ref 1.4–7.7)
Neutrophils Relative %: 74 % (ref 43.0–77.0)
Platelets: 391 10*3/uL (ref 150.0–400.0)
RBC: 5.18 Mil/uL — ABNORMAL HIGH (ref 3.87–5.11)
RDW: 19.2 % — ABNORMAL HIGH (ref 11.5–15.5)
WBC: 10.5 10*3/uL (ref 4.0–10.5)

## 2023-05-01 LAB — COMPREHENSIVE METABOLIC PANEL
ALT: 20 U/L (ref 0–35)
AST: 21 U/L (ref 0–37)
Albumin: 4.5 g/dL (ref 3.5–5.2)
Alkaline Phosphatase: 91 U/L (ref 39–117)
BUN: 15 mg/dL (ref 6–23)
CO2: 26 meq/L (ref 19–32)
Calcium: 9.9 mg/dL (ref 8.4–10.5)
Chloride: 104 meq/L (ref 96–112)
Creatinine, Ser: 0.87 mg/dL (ref 0.40–1.20)
GFR: 70.9 mL/min (ref >60.00)
Glucose, Bld: 100 mg/dL — ABNORMAL HIGH (ref 70–99)
Potassium: 4.3 meq/L (ref 3.5–5.1)
Sodium: 141 meq/L (ref 135–145)
Total Bilirubin: 0.3 mg/dL (ref 0.2–1.2)
Total Protein: 7.3 g/dL (ref 6.0–8.3)

## 2023-05-01 LAB — IBC PANEL
Iron: 32 ug/dL — ABNORMAL LOW (ref 42–145)
Saturation Ratios: 6.2 % — ABNORMAL LOW (ref 20.0–50.0)
TIBC: 513.8 ug/dL — ABNORMAL HIGH (ref 250.0–450.0)
Transferrin: 367 mg/dL — ABNORMAL HIGH (ref 212.0–360.0)

## 2023-05-01 LAB — TSH: TSH: 2.23 u[IU]/mL (ref 0.35–5.50)

## 2023-05-13 ENCOUNTER — Encounter: Payer: Self-pay | Admitting: Family Medicine

## 2023-05-14 ENCOUNTER — Ambulatory Visit: Payer: 59 | Admitting: Psychology

## 2023-05-14 DIAGNOSIS — F331 Major depressive disorder, recurrent, moderate: Secondary | ICD-10-CM | POA: Diagnosis not present

## 2023-05-14 NOTE — Progress Notes (Signed)
 [facility] Counselor/Therapist Progress Note  Patient ID: JODI CRISCUOLO, MRN: [id number],    Date: 05/14/2023  Time Spent: 3:00pm-3:55pm   55 minutes   Treatment Type: Individual Therapy  Reported Symptoms: stress  Mental Status Exam: Appearance:  Casual     Behavior: Appropriate  Motor: Normal  Speech/Language:  Normal Rate  Affect: Appropriate  Mood: normal  Thought process: normal  Thought content:   WNL  Sensory/Perceptual disturbances:   WNL  Orientation: oriented to person, place, time/date, and situation  Attention: Good  Concentration: Good  Memory: WNL  Fund of knowledge:  Good  Insight:   Good  Judgment:  Good  Impulse Control: Good   Risk Assessment: Danger to Self:  No Self-injurious Behavior: No Danger to Others: No Duty to Warn:no Physical Aggression / Violence:No  Access to Firearms a concern: No  Gang Involvement:No   Subjective: Pt present for face-to-face individual therapy via video.  Pt consents to telehealth video session and is aware of limitations and benefits of virtual sessions.  Location of pt: home Location of therapist: home office.  Pt talked about her health.    She has had dizziness and has not felt well.   She also feels like her "depression has returned".   Pt feels her health problems are triggering her depression.   Worked on Pharmacologist.   Pt talked about her cousin who has cancer in her breast, bone and brain.  She is in bad pain which worries pt.  Helped pt process her feelings and grief. Pt talked about politics and the state of the country and world.   Addressed pt's worries and concerns.   Pt has been talking to people on You Tube and has been making friends.   Pt feels this social connection is good for her.   Worked on self care strategies. Provided supportive therapy.    Interventions: Cognitive Behavioral Therapy and Insight-Oriented  Diagnosis:F33.1  Plan of Care: Recommend ongoing therapy.   Pt  participated in setting treatment goals.  Plan to meet monthly.  Pt agrees with treatment plan.  Treatment Plan Client Abilities/Strengths  Pt is bright, engaging, and motivated for therapy.   Client Treatment Preferences  Individual therapy.  Client Statement of Needs  Improve coping skills.  Symptoms  Depressed or irritable mood. Diminished interest in or enjoyment of activities. Lack of energy. Poor concentration and indecisiveness. Social withdrawal.  Problems Addressed  Unipolar Depression Goals 1. Alleviate depressive symptoms and return to previous level of effective functioning. 2. Appropriately grieve the loss in order to normalize mood and to return to previously adaptive level of functioning. Objective Learn and implement behavioral strategies to overcome depression. Target Date: 2023-12-02 Frequency: monthly  Progress: 55 Modality: individual  Related Interventions Engage the client in "behavioral activation," increasing his/her activity level and contact with sources of reward, while identifying processes that inhibit activation.  Use behavioral techniques such as instruction, rehearsal, role-playing, role reversal, as needed, to facilitate activity in the client's daily life; reinforce success. Assist the client in developing skills that increase the likelihood of deriving pleasure from behavioral activation (e.g., assertiveness skills, developing an exercise plan, less internal/more external focus, increased social involvement); reinforce success. Objective Identify important people in life, past and present, and describe the quality, good and poor, of those relationships. Target Date: 2023-12-02 Frequency: monthly  Progress: 55 Modality: individual  Related Interventions Conduct Interpersonal Therapy beginning with the assessment of the client's "interpersonal inventory" of important past and present  relationships; develop a case formulation linking depression to  grief, interpersonal role disputes, role transitions, and/or interpersonal deficits). Objective Learn and implement problem-solving and decision-making skills. Target Date: 2023-12-02 Frequency: monthly  Progress: 55 Modality: individual  Related Interventions Conduct Problem-Solving Therapy using techniques such as psychoeducation, modeling, and role-playing to teach client problem-solving skills (i.e., defining a problem specifically, generating possible solutions, evaluating the pros and cons of each solution, selecting and implementing a plan of action, evaluating the efficacy of the plan, accepting or revising the plan); role-play application of the problem-solving skill to a real life issue. Encourage in the client the development of a positive problem orientation in which problems and solving them are viewed as a natural part of life and not something to be feared, despaired, or avoided. 3. Develop healthy interpersonal relationships that lead to the alleviation and help prevent the relapse of depression. 4. Develop healthy thinking patterns and beliefs about self, others, and the world that lead to the alleviation and help prevent the relapse of depression. 5. Recognize, accept, and cope with feelings of depression. Diagnosis F33.1  Conditions For Discharge Achievement of treatment goals and objectives   Salomon Fick, LCSW

## 2023-05-18 ENCOUNTER — Inpatient Hospital Stay

## 2023-05-18 ENCOUNTER — Inpatient Hospital Stay: Attending: Family | Admitting: Family

## 2023-05-18 VITALS — BP 111/57 | HR 82 | Temp 97.9°F | Resp 17 | Ht 69.0 in | Wt 172.0 lb

## 2023-05-18 DIAGNOSIS — D509 Iron deficiency anemia, unspecified: Secondary | ICD-10-CM | POA: Insufficient documentation

## 2023-05-18 DIAGNOSIS — Z7982 Long term (current) use of aspirin: Secondary | ICD-10-CM | POA: Diagnosis not present

## 2023-05-18 DIAGNOSIS — Z86711 Personal history of pulmonary embolism: Secondary | ICD-10-CM | POA: Diagnosis not present

## 2023-05-18 DIAGNOSIS — D508 Other iron deficiency anemias: Secondary | ICD-10-CM

## 2023-05-18 DIAGNOSIS — D6862 Lupus anticoagulant syndrome: Secondary | ICD-10-CM | POA: Insufficient documentation

## 2023-05-18 DIAGNOSIS — Z79899 Other long term (current) drug therapy: Secondary | ICD-10-CM | POA: Diagnosis not present

## 2023-05-18 NOTE — Progress Notes (Signed)
 Hematology/Oncology Consultation   Name: Beverly Cole      MRN: [id number]    Location: Room/bed info not found  Date: 05/18/2023 Time:12:34 PM   REFERRING PHYSICIAN: Myrene Buddy L. Chase  REASON FOR CONSULT:  Iron deficiency anemia    DIAGNOSIS: IDA  HISTORY OF PRESENT ILLNESS:  Beverly Cole is a pleasant 63 yo caucasian female with new IDA. We have seen her in the past for history of bilateral PE with positive lupus anticoagulant and mildly depressed protein S level both of which resolved. We last saw her in 12/2019. She transitioned from Xarelto to full dose aspirin and has continued to take one daily.  She denies any new thrombotic events since we last saw her.  She went to the ED with dizziness and 2 episdoes of black stools. Hemoccult test at that time was negative.  Iron saturation was low at follow-up with PCP. Saturation only 6%, Hgb 11.7, MCV 74, WBC count 10.5 and platelets 391.  She has history of vertigo and ambulates with a cane or walker for added support. The anemia seems to have exacerbated this.  She has also noted mild SOB with exertion.  No picca.  She started taking the ACCRUFeR 2-3 weeks ago and feels that the dizziness and fatigue are stating to slowly improve.  No known familial history of anemia.  She has not noted any obvious blood loss. No abnormal bruising, no petechiae.  No fever, chills, n/v, cough, rash, chest pain, palpitations, abdominal pain or changes in bowel or bladder habits.  No personal history of cancer. Her mother had both lung and thyroid, father had esophageal and brother had testicular.  No diabetes or thyroid disease.  She states that she is due for her mammogram and plans to discuss with her PCP. Last done 12.2023 was negative.  EGD and colonoscopy were last done in 08/2021. This showed gastric polyps and diverticulosis.  She notes tingling in her arms and hands in the AM mostly that slowly resolves throughout the day.  No falls or syncope reported.  She  has not had much of an appetite for a long time which may be a source for IDA. She also states that's he needs to hydrate better throughout the day. Weight is stable at 172 lbs.   ROS: All other 10 point review of systems is negative.   PAST MEDICAL HISTORY:   Past Medical History:  Diagnosis Date   Anxiety    Atrial fibrillation (HCC)    a. Event monitor 2013 - PACs/bradycardia/SVT/short run of atrial fib by event monitor.    BIPOLAR DISORDER UNSPECIFIED    Bradycardia    Bursitis of hip 09/1999   Bilateral - Dr. Luiz Blare   DEPRESSION    Emphysema lung (HCC) 06/20/2016   pt states pulmonalongist stated started recently   GERD    Headache(784.0)    was with menstrual cycle. no longer a problem   HYPERLIPIDEMIA    Hypertension    Hypertension    IRRITABLE BOWEL SYNDROME, HX OF    Menopausal state 01/2012   [facility] = 88.5   Mitral valve prolapse 12/17/2000   a. dx 1980s, most recent echo did not demonstrate this.   MYALGIA    Normal coronary arteries    a. by cardiac CT 2013.   PAT (paroxysmal atrial tachycardia) (HCC)    PE (pulmonary embolism) 02/09/2015   a. Bilateral PEs 01/2015 when d-dimer 0.69, CP lying on left side. Followed by heme-onc - + lupus anticoagulant,  mildly depressed protein S. Dr. Myna Hidalgo is not certain if her hypercoagulable studies are significant for a thrombophilic state.   Premature atrial contractions    Pulmonary embolus (HCC) 02/10/2015   Shortness of breath    Pulmonary eval 05/2002   Thyroid nodule 2014   Tremors of nervous system     ALLERGIES: Allergies  Allergen Reactions   Cariprazine Other (See Comments)    Patient becomes suicidal when taking this medication.  Patient becomes suicidal when taking this medication.    Metoprolol Other (See Comments)    Hallucinations hair falls out Hallucinations hair falls out   Nickel Rash   Iron Nausea And Vomiting      MEDICATIONS:  Current Outpatient Medications on File Prior to Visit  Medication  Sig Dispense Refill   ALPRAZolam (XANAX) 0.5 MG tablet Take 1 tablet (0.5 mg total) by mouth 2 (two) times daily as needed. 30 tablet 0   aspirin EC 325 MG tablet Take 325 mg by mouth daily.     azelastine (ASTELIN) 0.1 % nasal spray Place 1 spray into both nostrils 2 (two) times daily as directed 30 mL 12   Dextromethorphan-buPROPion ER (AUVELITY) 45-105 MG TBCR Take 1 tablet by mouth 2 (two) times daily. 180 tablet 0   diltiazem (CARTIA XT) 180 MG 24 hr capsule Take 1 capsule (180 mg total) by mouth daily. 90 capsule 1   Ferric Maltol (ACCRUFER) 30 MG CAPS Take 1 capsule (30 mg total) by mouth daily. 30 capsule 2   fexofenadine (ALLEGRA ALLERGY) 180 MG tablet Take 1 tablet (180 mg total) by mouth daily.     fluticasone (FLONASE) 50 MCG/ACT nasal spray Place 2 sprays into the nose daily as needed. For allergies     NONFORMULARY OR COMPOUNDED ITEM sm Azelaic acid/ metronidazole/ivermectin 15%/1%/1% cream apply to face bid 1 each 5   pramipexole (MIRAPEX) 1 MG tablet Take 1/2 tablet daily 90 minutes before sleep for 1 week. If restless legs persists, increase to 1 tablet daily 90 minutes before sleep 30 tablet 0   Probiotic Product (PROBIOTIC DAILY PO) Take 1 tablet by mouth daily.      propranolol (INDERAL) 20 MG tablet Take 1 tablet (20 mg total) by mouth 2 (two) times daily. 60 tablet 1   RABEprazole (ACIPHEX) 20 MG tablet Take 1 tablet (20 mg total) by mouth daily. 90 tablet 3   sertraline (ZOLOFT) 50 MG tablet Take 1&1/2 tablets (75 mg total) by mouth daily. 135 tablet 0   tretinoin (RETIN-A) 0.025 % cream Apply topically at bedtime. 45 g 11   trimethoprim (TRIMPEX) 100 MG tablet Take 1 tablet (100 mg total) by mouth as directed after intercourse. 30 tablet 2   No current facility-administered medications on file prior to visit.     PAST SURGICAL HISTORY Past Surgical History:  Procedure Laterality Date   COLONOSCOPY     LAPAROSCOPIC APPENDECTOMY N/A 03/17/2017   Procedure: APPENDECTOMY  LAPAROSCOPIC;  Surgeon: Glenna Fellows, MD;  Location: [facility];  Service: General;  Laterality: N/A;   Lipoma (R) side  1990   Right back    FAMILY HISTORY: Family History  Problem Relation Age of Onset   Lung cancer Mother    Hypertension Mother    Hyperlipidemia Mother    Cancer Father        Esophageal cancer   Hyperlipidemia Father    Depression Father    Cancer Brother    Pulmonary embolism Brother    Colon cancer Paternal  Uncle    Sarcoidosis Other    Testicular cancer Other     SOCIAL HISTORY:  reports that she quit smoking about 27 years ago. Her smoking use included cigarettes. She started smoking about 52 years ago. She has a 25 pack-year smoking history. She has never used smokeless tobacco. She reports current alcohol use. She reports that she does not use drugs.  PERFORMANCE STATUS: The patient's performance status is 1 - Symptomatic but completely ambulatory  PHYSICAL EXAM: Most Recent Vital Signs: Blood pressure (!) 111/57, pulse 82, temperature 97.9 F (36.6 C), temperature source Oral, resp. rate 17, height 5\' 9"  (1.753 m), weight 172 lb (78 kg), last menstrual period 05/11/2012, SpO2 98%. BP (!) 111/57 (BP Location: Right Arm, Patient Position: Sitting)   Pulse 82   Temp 97.9 F (36.6 C) (Oral)   Resp 17   Ht 5\' 9"  (1.753 m)   Wt 172 lb (78 kg)   LMP 05/11/2012   SpO2 98%   BMI 25.40 kg/m   General Appearance:    Alert, cooperative, no distress, appears stated age  Head:    Normocephalic, without obvious abnormality, atraumatic  Eyes:    PERRL, conjunctiva/corneas clear, EOM's intact, fundi    benign, both eyes        Throat:   Lips, mucosa, and tongue normal; teeth and gums normal  Neck:   Supple, symmetrical, trachea midline, no adenopathy;    thyroid:  no enlargement/tenderness/nodules; no carotid   bruit or JVD  Back:     Symmetric, no curvature, ROM normal, no CVA tenderness  Lungs:     Clear to auscultation bilaterally, respirations  unlabored  Chest Wall:    No tenderness or deformity   Heart:    Regular rate and rhythm, S1 and S2 normal, no murmur, rub   or gallop     Abdomen:     Soft, non-tender, bowel sounds active all four quadrants,    no masses, no organomegaly        Extremities:   Extremities normal, atraumatic, no cyanosis or edema  Pulses:   2+ and symmetric all extremities  Skin:   Skin color, texture, turgor normal, no rashes or lesions  Lymph nodes:   Cervical, supraclavicular, and axillary nodes normal  Neurologic:   CNII-XII intact, normal strength, sensation and reflexes    throughout    LABORATORY DATA:  No results found for this or any previous visit (from the past 48 hours).    RADIOGRAPHY: No results found.     PATHOLOGY: None   ASSESSMENT/PLAN:  Ms. Guinyard is a pleasant 63 yo caucasian female with new IDA. She will continue taking the ACCRUFeR 1 tablet PO daily as her symptoms seam to be improving.  We will have her come back in 8 weeks for follow-up and recheck her labs at that time.  If she does not respond to the oral supplement we will give IV iron a try.   All questions were answered. The patient knows to call the clinic with any problems, questions or concerns. We can certainly see the patient much sooner if necessary.   Eileen Stanford, NP

## 2023-05-21 ENCOUNTER — Other Ambulatory Visit (HOSPITAL_COMMUNITY): Payer: Self-pay

## 2023-05-22 ENCOUNTER — Other Ambulatory Visit (HOSPITAL_BASED_OUTPATIENT_CLINIC_OR_DEPARTMENT_OTHER): Payer: Self-pay

## 2023-05-24 ENCOUNTER — Other Ambulatory Visit: Payer: Self-pay | Admitting: Psychiatry

## 2023-05-24 DIAGNOSIS — G2581 Restless legs syndrome: Secondary | ICD-10-CM

## 2023-05-26 MED ORDER — PRAMIPEXOLE DIHYDROCHLORIDE 1 MG PO TABS
1.0000 mg | ORAL_TABLET | Freq: Every evening | ORAL | 0 refills | Status: DC
  Filled 2023-05-26: qty 90, 90d supply, fill #0

## 2023-05-27 ENCOUNTER — Other Ambulatory Visit (HOSPITAL_COMMUNITY): Payer: Self-pay

## 2023-05-31 ENCOUNTER — Other Ambulatory Visit: Payer: Self-pay | Admitting: Family Medicine

## 2023-05-31 DIAGNOSIS — I10 Essential (primary) hypertension: Secondary | ICD-10-CM

## 2023-06-01 ENCOUNTER — Other Ambulatory Visit (HOSPITAL_COMMUNITY): Payer: Self-pay

## 2023-06-01 MED ORDER — DILTIAZEM HCL ER COATED BEADS 180 MG PO CP24
180.0000 mg | ORAL_CAPSULE | Freq: Every day | ORAL | 1 refills | Status: DC
Start: 2023-06-01 — End: 2023-11-25
  Filled 2023-06-01: qty 90, 90d supply, fill #0
  Filled 2023-08-29: qty 90, 90d supply, fill #1

## 2023-06-04 ENCOUNTER — Other Ambulatory Visit: Payer: Self-pay

## 2023-06-04 ENCOUNTER — Other Ambulatory Visit (INDEPENDENT_AMBULATORY_CARE_PROVIDER_SITE_OTHER)

## 2023-06-04 DIAGNOSIS — D509 Iron deficiency anemia, unspecified: Secondary | ICD-10-CM

## 2023-06-04 DIAGNOSIS — R5383 Other fatigue: Secondary | ICD-10-CM

## 2023-06-04 LAB — RETICULOCYTES
ABS Retic: 82240 {cells}/uL — ABNORMAL HIGH (ref 20000–80000)
Retic Ct Pct: 1.6 %

## 2023-06-05 LAB — CBC WITH DIFFERENTIAL/PLATELET
Basophils Absolute: 0.1 10*3/uL (ref 0.0–0.1)
Basophils Relative: 0.8 % (ref 0.0–3.0)
Eosinophils Absolute: 0.2 10*3/uL (ref 0.0–0.7)
Eosinophils Relative: 2 % (ref 0.0–5.0)
HCT: 39.4 % (ref 36.0–46.0)
Hemoglobin: 12.3 g/dL (ref 12.0–15.0)
Lymphocytes Relative: 26.3 % (ref 12.0–46.0)
Lymphs Abs: 2.1 10*3/uL (ref 0.7–4.0)
MCHC: 31.1 g/dL (ref 30.0–36.0)
MCV: 75.7 fl — ABNORMAL LOW (ref 78.0–100.0)
Monocytes Absolute: 0.4 10*3/uL (ref 0.1–1.0)
Monocytes Relative: 5.3 % (ref 3.0–12.0)
Neutro Abs: 5.3 10*3/uL (ref 1.4–7.7)
Neutrophils Relative %: 65.6 % (ref 43.0–77.0)
Platelets: 344 10*3/uL (ref 150.0–400.0)
RBC: 5.2 Mil/uL — ABNORMAL HIGH (ref 3.87–5.11)
RDW: 20.5 % — ABNORMAL HIGH (ref 11.5–15.5)
WBC: 8 10*3/uL (ref 4.0–10.5)

## 2023-06-05 LAB — IBC PANEL
Iron: 42 ug/dL (ref 42–145)
Saturation Ratios: 8.8 % — ABNORMAL LOW (ref 20.0–50.0)
TIBC: 477.4 ug/dL — ABNORMAL HIGH (ref 250.0–450.0)
Transferrin: 341 mg/dL (ref 212.0–360.0)

## 2023-06-05 LAB — FERRITIN: Ferritin: 10.3 ng/mL (ref 10.0–291.0)

## 2023-06-08 ENCOUNTER — Inpatient Hospital Stay: Attending: Family | Admitting: Family

## 2023-06-08 ENCOUNTER — Inpatient Hospital Stay

## 2023-06-08 ENCOUNTER — Other Ambulatory Visit: Payer: Self-pay

## 2023-06-08 ENCOUNTER — Encounter: Payer: Self-pay | Admitting: Family

## 2023-06-08 DIAGNOSIS — Z86711 Personal history of pulmonary embolism: Secondary | ICD-10-CM | POA: Diagnosis not present

## 2023-06-08 DIAGNOSIS — D509 Iron deficiency anemia, unspecified: Secondary | ICD-10-CM | POA: Diagnosis not present

## 2023-06-08 DIAGNOSIS — D6862 Lupus anticoagulant syndrome: Secondary | ICD-10-CM | POA: Diagnosis not present

## 2023-06-08 NOTE — Progress Notes (Unsigned)
 Hematology and Oncology Follow Up Visit  Beverly Cole [id number] Jul 15, 1960 63 y.o. 06/08/2023   Principle Diagnosis:  Iron deficiency anemia  History of bilateral PE with positive lupus anticoagulant and mildly depressed protein S level both of which resolved   Current Therapy:   IV iron as indicated Full dose aspirin  once daily   Interim History:  Beverly Cole is here today for follow-up. Her iron saturation is still low at 8% and ferritin 10. She did not respond to oral iron.  Iron saturation remains low at 8% and ferritin 10.  She is symptomatic with fatigue, weakness, mild SOB with exertion and dizziness at times.  No falls or syncope reported.  No swelling noted in her lower extremities.  She has tingling in her fingers that comes and goes. Unchanged from baseline.  She ambulates with her Rolator for added support.  No fever, n/v, cough, rash, chest pain, palpitations, abdominal pain or changes in bowel or bladder habits.  Appetite and hydration are good. Weight is stable at 170 lbs.   ECOG Performance Status: 1 - Symptomatic but completely ambulatory  Medications:  Allergies as of 06/08/2023       Reactions   Cariprazine Other (See Comments)   Patient becomes suicidal when taking this medication.  Patient becomes suicidal when taking this medication.    Metoprolol  Other (See Comments)   Hallucinations hair falls out Hallucinations hair falls out   Nickel Rash   Iron Nausea And Vomiting        Medication List        Accurate as of June 08, 2023  2:56 PM. If you have any questions, ask your nurse or doctor.          ACCRUFeR  30 MG Caps Generic drug: Ferric Maltol  Take 1 capsule (30 mg total) by mouth daily.   ALPRAZolam  0.5 MG tablet Commonly known as: XANAX  Take 1 tablet (0.5 mg total) by mouth 2 (two) times daily as needed.   aspirin  EC 325 MG tablet Take 325 mg by mouth daily.   Auvelity  45-105 MG Tbcr Generic drug: Dextromethorphan -buPROPion   ER Take 1 tablet by mouth 2 (two) times daily.   Azelastine  HCl 137 MCG/SPRAY Soln Place 1 spray into both nostrils 2 (two) times daily as directed   diltiazem  180 MG 24 hr capsule Commonly known as: Cartia  XT Take 1 capsule (180 mg total) by mouth daily.   fexofenadine  180 MG tablet Commonly known as: Allegra  Allergy Take 1 tablet (180 mg total) by mouth daily.   fluticasone  50 MCG/ACT nasal spray Commonly known as: FLONASE  Place 2 sprays into the nose daily as needed. For allergies   NONFORMULARY OR COMPOUNDED ITEM sm Azelaic acid / metronidazole /ivermectin 15%/1%/1% cream apply to face bid   pramipexole  1 MG tablet Commonly known as: MIRAPEX  Take 1 tablet (1 mg total) by mouth every evening.   PROBIOTIC DAILY PO Take 1 tablet by mouth daily.   propranolol  20 MG tablet Commonly known as: INDERAL  Take 1 tablet (20 mg total) by mouth 2 (two) times daily.   RABEprazole  20 MG tablet Commonly known as: ACIPHEX  Take 1 tablet (20 mg total) by mouth daily.   sertraline  50 MG tablet Commonly known as: ZOLOFT  Take 1&1/2 tablets (75 mg total) by mouth daily.   tretinoin  0.025 % cream Commonly known as: RETIN-A  Apply topically at bedtime.   trimethoprim  100 MG tablet Commonly known as: TRIMPEX  Take 1 tablet (100 mg total) by mouth as directed after intercourse.  Allergies:  Allergies  Allergen Reactions   Cariprazine Other (See Comments)    Patient becomes suicidal when taking this medication.  Patient becomes suicidal when taking this medication.    Metoprolol  Other (See Comments)    Hallucinations hair falls out Hallucinations hair falls out   Nickel Rash   Iron Nausea And Vomiting    Past Medical History, Surgical history, Social history, and Family History were reviewed and updated.  Review of Systems: All other 10 point review of systems is negative.   Physical Exam:  vitals were not taken for this visit.   Wt Readings from Last 3 Encounters:   05/18/23 172 lb (78 kg)  04/30/23 172 lb 9.6 oz (78.3 kg)  04/14/23 174 lb 3.2 oz (79 kg)    Ocular: Sclerae unicteric, pupils equal, round and reactive to light Ear-nose-throat: Oropharynx clear, dentition fair Lymphatic: No cervical or supraclavicular adenopathy Lungs no rales or rhonchi, good excursion bilaterally Heart regular rate and rhythm, no murmur appreciated Abd soft, nontender, positive bowel sounds MSK no focal spinal tenderness, no joint edema Neuro: non-focal, well-oriented, appropriate affect Breasts: Deferred   Lab Results  Component Value Date   WBC 8.0 06/04/2023   HGB 12.3 06/04/2023   HCT 39.4 06/04/2023   MCV 75.7 (L) 06/04/2023   PLT 344.0 06/04/2023   Lab Results  Component Value Date   FERRITIN 10.3 06/04/2023   IRON 42 06/04/2023   TIBC 477.4 (H) 06/04/2023   IRONPCTSAT 8.8 (L) 06/04/2023   Lab Results  Component Value Date   RETICCTPCT 1.6 06/04/2023   RBC 5.20 (H) 06/04/2023   RETICCTABS 82,240 (H) 06/04/2023   No results found for: "KPAFRELGTCHN", "LAMBDASER", "KAPLAMBRATIO" No results found for: "IGGSERUM", "IGA", "IGMSERUM" No results found for: "TOTALPROTELP", "ALBUMINELP", "A1GS", "A2GS", "BETS", "BETA2SER", "GAMS", "MSPIKE", "SPEI"   Chemistry      Component Value Date/Time   NA 141 04/30/2023 1540   NA 145 (H) 04/05/2020 1508   NA 142 01/21/2017 1013   NA 143 01/16/2016 1005   K 4.3 04/30/2023 1540   K 3.2 (L) 01/21/2017 1013   K 3.3 (L) 01/16/2016 1005   CL 104 04/30/2023 1540   CL 106 01/21/2017 1013   CO2 26 04/30/2023 1540   CO2 29 01/21/2017 1013   CO2 20 (L) 01/16/2016 1005   BUN 15 04/30/2023 1540   BUN 14 04/05/2020 1508   BUN 9 01/21/2017 1013   BUN 12.6 01/16/2016 1005   CREATININE 0.87 04/30/2023 1540   CREATININE 0.94 12/20/2019 1449   CREATININE 0.9 01/21/2017 1013   CREATININE 0.9 01/16/2016 1005      Component Value Date/Time   CALCIUM  9.9 04/30/2023 1540   CALCIUM  9.2 01/21/2017 1013   CALCIUM  9.5  01/16/2016 1005   ALKPHOS 91 04/30/2023 1540   ALKPHOS 62 01/21/2017 1013   ALKPHOS 81 01/16/2016 1005   AST 21 04/30/2023 1540   AST 13 (L) 12/20/2019 1449   AST 16 01/16/2016 1005   ALT 20 04/30/2023 1540   ALT 15 12/20/2019 1449   ALT 22 01/21/2017 1013   ALT 24 01/16/2016 1005   BILITOT 0.3 04/30/2023 1540   BILITOT <0.2 04/05/2020 1508   BILITOT 0.3 12/20/2019 1449   BILITOT 0.59 01/16/2016 1005       Impression and Plan: Ms. Pressnall is a pleasant 63 yo caucasian female with IDA not responsive to oral iron.  She needs 3 doses of IV iron. Follow-up in 8 weeks.    Kennard Pea, NP  4/28/20252:56 PM

## 2023-06-09 ENCOUNTER — Encounter: Payer: Self-pay | Admitting: Family

## 2023-06-11 ENCOUNTER — Ambulatory Visit: Payer: 59 | Admitting: Psychology

## 2023-06-11 DIAGNOSIS — F331 Major depressive disorder, recurrent, moderate: Secondary | ICD-10-CM | POA: Diagnosis not present

## 2023-06-11 NOTE — Progress Notes (Signed)
 [facility] Counselor/Therapist Progress Note  Patient ID: Beverly Cole, MRN: [id number],    Date: 06/11/2023  Time Spent: 3:00pm-3:50pm     Treatment Type: Individual Therapy  Reported Symptoms: stress  Mental Status Exam: Appearance:  Casual     Behavior: Appropriate  Motor: Normal  Speech/Language:  Normal Rate  Affect: Appropriate  Mood: normal  Thought process: normal  Thought content:   WNL  Sensory/Perceptual disturbances:   WNL  Orientation: oriented to person, place, time/date, and situation  Attention: Good  Concentration: Good  Memory: WNL  Fund of knowledge:  Good  Insight:   Good  Judgment:  Good  Impulse Control: Good   Risk Assessment: Danger to Self:  No Self-injurious Behavior: No Danger to Others: No Duty to Warn:no Physical Aggression / Violence:No  Access to Firearms a concern: No  Gang Involvement:No   Subjective: Pt present for face-to-face individual therapy via video.  Pt consents to telehealth video session and is aware of limitations and benefits of virtual sessions.  Location of pt: home Location of therapist: home office.  Pt talked about her health.    She has to get iron  infusions bc of her iron  deficiency.   Pt states she feels very weak.  Pt feels her health problems are triggering her depression.   Worked on Pharmacologist.   Pt talked about her [name]'s job promotion.  He will start the new job May 4th.  Pt is hoping that his work hours do not increase.  Pt talked about politics and the state of the country and world.   Addressed pt's worries and concerns.   Pt is connecting with people online and some of the people are too needy.  Addressed how pt can have healthy boundaries.   Worked on self care strategies. Provided supportive therapy.    Interventions: Cognitive Behavioral Therapy and Insight-Oriented  Diagnosis:F33.1  Plan of Care: Recommend ongoing therapy.   Pt participated in setting treatment  goals.  Plan to meet monthly.  Pt agrees with treatment plan.  Treatment Plan Client Abilities/Strengths  Pt is bright, engaging, and motivated for therapy.   Client Treatment Preferences  Individual therapy.  Client Statement of Needs  Improve coping skills.  Symptoms  Depressed or irritable mood. Diminished interest in or enjoyment of activities. Lack of energy. Poor concentration and indecisiveness. Social withdrawal.  Problems Addressed  Unipolar Depression Goals 1. Alleviate depressive symptoms and return to previous level of effective functioning. 2. Appropriately grieve the loss in order to normalize mood and to return to previously adaptive level of functioning. Objective Learn and implement behavioral strategies to overcome depression. Target Date: 2023-12-02 Frequency: monthly  Progress: 55 Modality: individual  Related Interventions Engage the client in "behavioral activation," increasing his/her activity level and contact with sources of reward, while identifying processes that inhibit activation.  Use behavioral techniques such as instruction, rehearsal, role-playing, role reversal, as needed, to facilitate activity in the client's daily life; reinforce success. Assist the client in developing skills that increase the likelihood of deriving pleasure from behavioral activation (e.g., assertiveness skills, developing an exercise plan, less internal/more external focus, increased social involvement); reinforce success. Objective Identify important people in life, past and present, and describe the quality, good and poor, of those relationships. Target Date: 2023-12-02 Frequency: monthly  Progress: 55 Modality: individual  Related Interventions Conduct Interpersonal Therapy beginning with the assessment of the client's "interpersonal inventory" of important past and present relationships; develop a case formulation linking depression  to grief, interpersonal role disputes,  role transitions, and/or interpersonal deficits). Objective Learn and implement problem-solving and decision-making skills. Target Date: 2023-12-02 Frequency: monthly  Progress: 55 Modality: individual  Related Interventions Conduct Problem-Solving Therapy using techniques such as psychoeducation, modeling, and role-playing to teach client problem-solving skills (i.e., defining a problem specifically, generating possible solutions, evaluating the pros and cons of each solution, selecting and implementing a plan of action, evaluating the efficacy of the plan, accepting or revising the plan); role-play application of the problem-solving skill to a real life issue. Encourage in the client the development of a positive problem orientation in which problems and solving them are viewed as a natural part of life and not something to be feared, despaired, or avoided. 3. Develop healthy interpersonal relationships that lead to the alleviation and help prevent the relapse of depression. 4. Develop healthy thinking patterns and beliefs about self, others, and the world that lead to the alleviation and help prevent the relapse of depression. 5. Recognize, accept, and cope with feelings of depression. Diagnosis F33.1  Conditions For Discharge Achievement of treatment goals and objectives   Beverly Harrier, LCSW

## 2023-06-12 ENCOUNTER — Inpatient Hospital Stay: Attending: Hematology & Oncology

## 2023-06-12 VITALS — BP 124/47 | HR 74 | Temp 98.0°F | Resp 18

## 2023-06-12 DIAGNOSIS — D509 Iron deficiency anemia, unspecified: Secondary | ICD-10-CM | POA: Diagnosis not present

## 2023-06-12 MED ORDER — SODIUM CHLORIDE 0.9% FLUSH: 10.0000 mL | Freq: Once | INTRAVENOUS | Status: DC | PRN

## 2023-06-12 MED ORDER — SODIUM CHLORIDE 0.9 % IV SOLN
INTRAVENOUS | Status: DC
Start: 2023-06-12 — End: 2023-06-12

## 2023-06-12 MED ORDER — SODIUM CHLORIDE 0.9% FLUSH
3.0000 mL | Freq: Once | INTRAVENOUS | Status: DC | PRN
Start: 2023-06-12 — End: 2023-06-12

## 2023-06-12 MED ORDER — SODIUM CHLORIDE 0.9 % IV SOLN
300.0000 mg | Freq: Once | INTRAVENOUS | Status: AC
  Administered 2023-06-12: 300 mg via INTRAVENOUS
  Filled 2023-06-12: qty 300

## 2023-06-12 NOTE — Patient Instructions (Signed)

## 2023-06-17 ENCOUNTER — Other Ambulatory Visit: Payer: Self-pay

## 2023-06-17 ENCOUNTER — Other Ambulatory Visit: Payer: Self-pay | Admitting: Psychiatry

## 2023-06-17 ENCOUNTER — Other Ambulatory Visit (HOSPITAL_COMMUNITY): Payer: Self-pay

## 2023-06-17 DIAGNOSIS — R251 Tremor, unspecified: Secondary | ICD-10-CM

## 2023-06-17 MED ORDER — PROPRANOLOL HCL 20 MG PO TABS
20.0000 mg | ORAL_TABLET | Freq: Two times a day (BID) | ORAL | 0 refills | Status: DC
  Filled 2023-06-17: qty 180, 90d supply, fill #0

## 2023-06-18 ENCOUNTER — Other Ambulatory Visit (HOSPITAL_COMMUNITY): Payer: Self-pay

## 2023-06-19 ENCOUNTER — Inpatient Hospital Stay

## 2023-06-19 VITALS — BP 106/51 | HR 66 | Temp 98.0°F | Resp 17

## 2023-06-19 DIAGNOSIS — D509 Iron deficiency anemia, unspecified: Secondary | ICD-10-CM | POA: Diagnosis not present

## 2023-06-19 MED ORDER — SODIUM CHLORIDE 0.9 % IV SOLN
300.0000 mg | Freq: Once | INTRAVENOUS | Status: AC
  Administered 2023-06-19: 300 mg via INTRAVENOUS
  Filled 2023-06-19: qty 300

## 2023-06-19 MED ORDER — SODIUM CHLORIDE 0.9 % IV SOLN
INTRAVENOUS | Status: DC
Start: 2023-06-19 — End: 2023-06-19

## 2023-06-19 NOTE — Patient Instructions (Signed)

## 2023-06-20 ENCOUNTER — Other Ambulatory Visit: Payer: Self-pay | Admitting: Psychiatry

## 2023-06-20 DIAGNOSIS — F331 Major depressive disorder, recurrent, moderate: Secondary | ICD-10-CM

## 2023-06-21 ENCOUNTER — Other Ambulatory Visit (HOSPITAL_COMMUNITY): Payer: Self-pay

## 2023-06-21 MED ORDER — AUVELITY 45-105 MG PO TBCR
1.0000 | EXTENDED_RELEASE_TABLET | Freq: Two times a day (BID) | ORAL | 0 refills | Status: DC
Start: 2023-06-21 — End: 2023-06-23
  Filled 2023-06-21: qty 60, 30d supply, fill #0

## 2023-06-22 ENCOUNTER — Other Ambulatory Visit (HOSPITAL_COMMUNITY): Payer: Self-pay

## 2023-06-23 ENCOUNTER — Telehealth (INDEPENDENT_AMBULATORY_CARE_PROVIDER_SITE_OTHER): Payer: Medicare Other | Admitting: Psychiatry

## 2023-06-23 ENCOUNTER — Encounter: Payer: Self-pay | Admitting: Psychiatry

## 2023-06-23 ENCOUNTER — Other Ambulatory Visit (HOSPITAL_COMMUNITY): Payer: Self-pay

## 2023-06-23 DIAGNOSIS — G250 Essential tremor: Secondary | ICD-10-CM | POA: Diagnosis not present

## 2023-06-23 DIAGNOSIS — R251 Tremor, unspecified: Secondary | ICD-10-CM

## 2023-06-23 DIAGNOSIS — F4001 Agoraphobia with panic disorder: Secondary | ICD-10-CM

## 2023-06-23 DIAGNOSIS — G2581 Restless legs syndrome: Secondary | ICD-10-CM

## 2023-06-23 DIAGNOSIS — D500 Iron deficiency anemia secondary to blood loss (chronic): Secondary | ICD-10-CM

## 2023-06-23 DIAGNOSIS — F331 Major depressive disorder, recurrent, moderate: Secondary | ICD-10-CM | POA: Diagnosis not present

## 2023-06-23 DIAGNOSIS — F423 Hoarding disorder: Secondary | ICD-10-CM | POA: Diagnosis not present

## 2023-06-23 DIAGNOSIS — F40240 Claustrophobia: Secondary | ICD-10-CM | POA: Diagnosis not present

## 2023-06-23 DIAGNOSIS — F4000 Agoraphobia, unspecified: Secondary | ICD-10-CM | POA: Diagnosis not present

## 2023-06-23 DIAGNOSIS — F411 Generalized anxiety disorder: Secondary | ICD-10-CM | POA: Diagnosis not present

## 2023-06-23 MED ORDER — SERTRALINE HCL 50 MG PO TABS
75.0000 mg | ORAL_TABLET | Freq: Every day | ORAL | 1 refills | Status: DC
  Filled 2023-06-23 – 2023-07-15 (×2): qty 135, 90d supply, fill #0
  Filled 2023-10-13: qty 135, 90d supply, fill #1

## 2023-06-23 MED ORDER — PRAMIPEXOLE DIHYDROCHLORIDE 1 MG PO TABS
1.0000 mg | ORAL_TABLET | Freq: Every evening | ORAL | 1 refills | Status: DC
  Filled 2023-06-23 – 2023-08-25 (×2): qty 90, 90d supply, fill #0
  Filled 2023-11-25: qty 90, 90d supply, fill #1

## 2023-06-23 MED ORDER — ALPRAZOLAM 0.5 MG PO TABS
0.5000 mg | ORAL_TABLET | Freq: Two times a day (BID) | ORAL | 0 refills | Status: DC | PRN
  Filled 2023-06-23: qty 30, 15d supply, fill #0

## 2023-06-23 MED ORDER — PROPRANOLOL HCL 20 MG PO TABS
20.0000 mg | ORAL_TABLET | Freq: Two times a day (BID) | ORAL | 1 refills | Status: AC
  Filled 2023-06-23 – 2023-09-13 (×2): qty 180, 90d supply, fill #0
  Filled 2023-12-23: qty 180, 90d supply, fill #1

## 2023-06-23 MED ORDER — AUVELITY 45-105 MG PO TBCR
1.0000 | EXTENDED_RELEASE_TABLET | Freq: Two times a day (BID) | ORAL | 3 refills | Status: DC
  Filled 2023-06-23 – 2023-07-21 (×2): qty 60, 30d supply, fill #0
  Filled 2023-08-17: qty 60, 30d supply, fill #1
  Filled 2023-09-23 – 2023-09-24 (×3): qty 60, 30d supply, fill #2
  Filled 2023-10-30: qty 60, 30d supply, fill #3

## 2023-06-23 NOTE — Progress Notes (Signed)
 Beverly Cole [id number] [birth year] 63 y.o.  Video Visit via My Chart  I connected with pt by video using My Chart and verified that I am speaking with the correct person using two identifiers.   I discussed the limitations, risks, security and privacy concerns of performing an evaluation and management service by My Chart  and the availability of in person appointments. I also discussed with the patient that there may be a patient responsible charge related to this service. The patient expressed understanding and agreed to proceed.  I discussed the assessment and treatment plan with the patient. The patient was provided an opportunity to ask questions and all were answered. The patient agreed with the plan and demonstrated an understanding of the instructions.   The patient was advised to call back or seek an in-person evaluation if the symptoms worsen or if the condition fails to improve as anticipated.  I provided 30 minutes of video time during this encounter.  The patient was located at home and the provider was located office. Session 330-400 pm  Subjective:   Patient ID:  Beverly Cole is a 63 y.o. (DOB February 19, 1960) female.  Chief Complaint:  Chief Complaint  Patient presents with   Follow-up   Depression   Anxiety   Fatigue    Beverly Cole  today for follow-up of chronic depression and anxiety.  Lately depression worse than anxiety.  Seen with husband today  When seen May 11, 2018.  For persistent anxiety we elected to retry buspirone  and try increasing it to 30 mg twice daily if tolerated.   Couldn't tolerate it this time DT muscle spasms.  Pharmacy called saying she could have serotonin syndrome.  When seen August 09, 2018 and she refused med changes despite chronic anxiety and depression. She remained on sertraline  50, Depakote  ER 1000 mg, Wellbutrin  SR 200 mg AM  seen December 09, 2018.  Because of chronic depression and dysfunction with fatigue and poor motivation we  decided off label trial  trial of modafinil  200 mg 1/2 each am for 1 week then 200 mg each AM. Did see benefit from it.  More energy and working to stay out of bed more.    seen January 10, 2019.  The modafinil  had been helpful.  No meds were changed.  seen April 07, 2019.  The following was noted: Still sees benefit from modafinil  but still not caring for herself like she should.  Not wanting to go anywhere.  Not driven since early fall.  Hard to breathe with the mask and it creates anxiety.  Hard to be in enclosed spaces like cars and planes for extended periods.   Trying to set her alarm clock.  No hallucinations.  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  Plans to send more too.  Still follow through is not great.  Still depression and productivity is still poor but it is not as poor as it was before modafinil . Plan increase modafinil  to 300 mg daily to see if function can be improved.  06/29/2019 appointment, the following is noted: Rare Xanax .  Did increase modafinil  to 300 mg daily. Saw benefit with the increase with less time in bed but still markedly functionally impaired.   It seems like I get used to it and then doesn't maintain her get up and go.  Still having a hard time with self care like hygiene. Chronic low motivation  and lack of interest is unchanged.   Gall bladder problems.  It's better at the moment.    Not more sad, just like I've always been.  Can't make herself leave the house DT depression and anxiety.  She won't do chores.   Less excess sleeping.  He works from home 2 days weekly.  No periods of hyperactivity or manic sx since the spring. Attending therapy every 2 weeks.   Can be confused when first wakes up regardless of time of day. Had episode of hallucination in the middle of the night when awakened. Scarecrow frightened her. This is rare event.  No hallucinations during the day.  Melatonin makes  it worse. Has had fearful thoughts of planes crashing or bad things happening to others and it might be her fault.  Fight with brother lately and not speaking with him now. Plan:  No med changes  10/11/19 appt with the following noted: Unsteady and tremors gotten worse.  Not gone to doctor. Propranolol  not helping as much.   On Depakote  ER 1000mg  HS which is a reduction. So tired all the time and H says she's not doing anything.  H says accomplishing littte and still lays in bed a lot.  H says it's 2-3 PM before she gets OOB.  Memory is poor.  Everything is such a chore and doesn't want to do things. H says she starts things she doesn't finish.  Lots of new projects.   Increase modafinil  on her own to 400 mg daily in the AM.  Anxious.  Rare Xanax .  Was happy when got new cats in the summer.  Anxious and Depressed and describes anxiety as Moderate. Anxiety symptoms include: Excessive Worry,.  She thinks sertraline  reduces her obsessiveness.  Past hx panic so avoidant. Never get to relax.  Pt reports sleeps excessively even with modafinil . Pt reports that appetite is good. Pt reports that energy is poor and loss of interest or pleasure in usual activities, poor motivation and withdrawn from usual activities. Just don't care about things and admits to a lot of general anxiety and everything takes a lot of energy out of her.  Compulsively tapes and watches TV shows, news and awards shows.  Can't delete it bc I'll miss something.  Watches TV in bed. Concentration is poor. Suicidal thoughts:  denied by patient. But chronic death thoughts.  Poor productivity overall.  Chronic poor self care, showers only weekly.  Just don't care.  Chronic disability.  She thinks sertraline  helped the anxiety some.  Likes that H is at home.  Helps her mood.  Doesn't care about going out. Feels faint when she tries to wear a mask.  Too anxious driving and is avoidant. Collects TV shows, she can't erase unless she watches  entirely. Hoards some bc afraid she might need it some day.   Rare use of Xanax  bc fears addiction. Plan: Attempted referral to neurology at [facility].  They refused to see patient stating they had nothing to offer her. It was suggested to patient that she try to get in with Dr. Tilda Fogo whom she had seen in the past.  01/10/2020 appointment with the following noted: Reduced modafinil  to 300 mg bc didn't want to get hooked on anything about 2-3 weeks.  Did initially see benefit from the modafinil  but seemed to get tolerant to it. Feels better not talking to brother bc differing views of politics.  Feels he's a negative person but never depressed. Overall thinks she's doing pretty  well relatively.  However is chronically depressed and never happy.  Nothing changed with the meds. Depression worse than anxiety. H didn't think modafinil  made much difference.  H CO her inactivity.. She felt it was helpful for energy initially. Had to cancel trips bc can't wear a mask for that long.  Makes her sad. Driven twice in a year or so DT anxiety. Wants prn Xanax . Plan: DC sertraline  Start fluoxetine  20 mg daily with olanzapine  5 mg daily.  05/22/2020 appointment with following noted: Didn't remember to stop sertraline  and took fluoxetine  and olanzapine  for 2 days and couldn't eat and stopped it. Then went to dermatologist December who told her couldn't take Zoloft  with fungus med and stopped sertraline . Then became more depressed.  I have to go back on the Zoloft . Remained on modafinil , Wellbutrin , Depakote .  Even less motivated to do anything like cook, clean.  Won't go to [employer] etc. Chronic depression with hopelessness to get better Plan: No med changes  07/31/2020 appointment with the following noted: Back on sertraline  at 75 mg daily with Remained on modafinil , Wellbutrin , Depakote .  Aunt died and one of worst fears but not thinking about it. Family got Covid but she recovered.  She thought  it might kill and didn't care. Tremors are getting worse and balance problems. Went to concert and that was a problem.  Enjoyed the concert. Also saw Kiss, Elton John.  Music always important to her.  1980 had planned to commit suicide listening to an album over and over but didn't attempt. B and M have familial tremor also. Still anxious and depressed and haven't driven in a year DT anxiety. Plan: Option of trial olanzapine  alone without fluoxetine .  Rec retry.  She agrees.  10/23/2020 appointment with the following noted: "Olanzapine  is really working."  Initial SE drunk at 5 mg HS so cut it in half.  Got used to it but I feel happy and more relaxed and easier time going to sleep.  If I could still chose to die I would but clearly happier but not more hopeful.  Laughing more.  No SI. Needs to care about things more.  Chronic lack of motivations. Still doesn't want to leave the house but did enjoy a concert in [place]. Chronic unsteadiness is worse.  Last disc with doctor in March who rec PT but she never did the PT.  Misses aunt who died.   She reduced the Depakote  to 500 mg HS. When stopped sertraline  was not doing well and restarted it bc stressed and unhappy.  Seems to be a key med for me. Pending card test soon.  Feels better than in the past. RLS is worse after starting olanzapine  and even affecting arms but had it before.  Happens after lays down at night.  Takes MG which helps.   Xanax  about once every 3 weeks. Plan: Successful trial olanzapine  2.5 mg with sertraline  50 but worse RLS Trial gabapentin  for RLS 100-300 mg PM and disc SE.  Higher if needed..  Once controlled consider increase olanzapine .  01/23/2021 appointment with the following noted: Taking gabapentin  300 mg but needs to go higher for RLS.  SE dizzy and reduced concentration. Reads on internet.   Increased olanzapine  to 5 mg HS a couple of mos ago and feels better with it.  Happier.  More relaxed.  Only think that's  helped in a long time. Handwriting is so much better, less shakey. Has RLS and can be bad when she lays down. Got  a new kitten and enjoys it.  Now has 4 cats. Afraid to try new meds after negative reaction to Vraylar.  Afraid of any med change.  Afraid of having SI and afraid she'd do it if had SI.   Saw pulmonologist over weakness and he says she's OK except deconditioned.  Can't tolerate wearing the masks. Plan: Successful trial olanzapine  5 mg with sertraline  50 for depression and anxiety but worse RLS Consider increasing the olanzapine  later if RLS can be managed. increase gabapentin  for RLS 400-600 mg PM and disc SE.  Higher if needed..  Once controlled consider increase olanzapine .  03/28/2021 appointment with the following noted: Uses Ivory soap behind knee with benefit.  Increased gabapentin  to 600 mg PM.  Dizziness resolved which occurred when turned over in bed.  Not a problem. RLS still 5/7 days.  But had no vacation RLS bc was walking more.  Not active at home. Sleep is great 8-9 hours and best in a long time. Enjoyed expensive vacation to [facility].  Once in a lifetime thing.  Some things she didn't like for example the cost. Went to Journey concert and others. Depression is better.  Olanzapine  really helped.  Didn't expect it.   Hasn't felt this good in years and H notices.   Caffeine 2 cups hot tea and limited. Plan: Successful trial olanzapine  5 mg with sertraline  50 for depression and anxiety but worse RLS Consider increasing the olanzapine  later if RLS can be managed. increase gabapentin  for ok to increase RLS 600-900 mg PM and disc SE.  Higher if needed..  Once controlled consider increase olanzapine .  05/27/2021 phone call wanting to increase olanzapine  to see if it will further help depression.  She had increased it on her own to 10 mg for a couple of weeks.  She has to increase to 15 mg.  This was agreed  06/26/21 appt noted: On sertraline  50 and olanzapine  15 mg HS with  1200 mg PM gabapentin  for RLS RLS kicks in about 4 hours after olanzapine .  Prefers to stay up late bc doesn't really want to go to sleep.  RLS 5 days per week. Sleep 8 hours. Caffeine 2 cups hot tea and limited. Benefit from increase olanzapine . Helped anxiety and able to go to sleep earlier than usual.   Before trip was happier than I've been in years.  Not real happy with trip and now nothing to look forward to.   SE some dizziness with gabapentin .  But it's better.   Mood still better than before olanzapine . Plan: benefit olanzapine  15 mg with sertraline  50 for depression and anxiety but worse RLS She wants to Consider increasing the olanzapine  later if RLS can be managed. OK increase olanzapine  to 20 mg PM If RLS is worse then will have to reduce.  08/26/2021 appointment with the following noted: Increased olanzapine  20 mg HS.  Not sure if it made a difference.  Terrible time with memorfy ongoing. Sleep good.  Still has RLS and can get pain in her arm including yesterday at 6-7 PM.   Asks about Ozempic for OCD. Depends on H [name]. Anxiety is better and that helps sleep.  Depression better than it was.  No longer thinks of death all the time like she use to.  Still not very active.  Need something to look forward to. Stays up late and likes doing so bc less depressed in evening.   Plan continue olanzapine  20 mg daily with sertraline  75 mg daily  11/26/2021 appointment noted:  seen with Brookie Cantor Memory is poor.  Forgets TV shows. Taking alprazolam  rarely. Overall thinks she is doing fine.  Still not driving much.  Don't like to leave the house but went outside on the deck some which is improvement. Sleeps 12 hours nightly but then up all day. Happier with olanazapine and thinks the higher doses did help the anxiety. H doesn't see any changes in the last year.  Covid messed her up and she has become housebound.  Not social.  She got used to being inside and no motivation to change it. H  cleans and prepares meals.  Doesn't have motivation for chores. Plan: continue sertraline  50 for depression and anxiety  She wants to Consider increasing the olanzapine  later if RLS can be managed. Reduce olanzapine  to 10 mg HS bc unclear if higher dose helped more than lower. RLS is better with MG Modafinil  300 to 400 mg daily for alertness and off label for treatment resistant depression Trial Auvelity  for TRD 1 in AM for 1 week, then 1 twice daily. DC Wellbutriin  12/25/2021 phone call requesting 90-day prescription of Auvelity  stating that it was working well.  02/19/22 appt noted: Current psych med: rare Xanax , Auvelity  BID, no fluoxetine , modafinil  300 mg AM , olanzapine  10 pm, propranolol  20 BID, sertraline  75 mg daily. Gabapentin  1200 mg daily I feel like doing things more and I see things more clearly.  Realizes now buying things to make myself feel happier and not really a buying problem per se.  Now not driven to buy things she shouldn't.   Feels embarrassed by what she did before with this.  Had bought a lot of expensive baskets and now no longer does that.  She is happier she's better and H notices it too.  Putting things away and better cleaning.  Tired a lot.  Better motivation..  had lunch with friends scheduled for Friday but is cancelling.  Not sure whay she is avoidant socially.  Anxious about it.  Almost fears a panic attack.  Not sure when had last panic but had them when she tried to go to work.  I can't.  Doesn't drive often.  Has a [place] but doesn't like driving it.  Can't drive at night.   Still sleeps 12 hours in 24.   Happy the way I am right now. Some trouble with restless legs. Plan: no med changes Less depressed with Auvelity  for TRD BID markedly  04/23/22 appt noted: "Haven't felt this good in years." Consistent with Auvelity  BID.   Current psych meds: modafinil  300 Am, Auvelity  BID, sertraline  75, olanzapine  10 mg HS, propranolol  20 BID. Asks about going off  olanzapine . 2 cats put to sleep and she did well with it despite in past had SI when doing it. Still sleeps 12 hour daily bc so tired and wants to try stopping it. No RLS on gabapentin .  To [place]  next month.   Plan: continue sertraline  75 for depression and anxiety  Less depressed with Auvelity  for TRD BID markedly, best in years. Ok trial reduction to olanzapine  to 5  mg HS .  After 30 D if well can stop it. RLS is better with MG  06/24/22 appt noted: Not often with alprazolam  other than sleep if needed.   Psych med: Auvelity  BID , gabapentin  1200 pm for back pain, modafinil  300 AM, olanzapine  5 mg HS, sertraline  75 mg daily, propranolol  20 BID, alprazolam  0.5 mg prn When reduced olanzapine  from  10 to 5 then not as happy. But is less sleepy and tired and wants to stay on the lower dose for now. Overall still doing good and "very good".  Hurt back packing for [place]  and still some back problems.  Went to [place] .  Had a good time there.  Bad weather cancelled the snorkeling trip.   Overall satisfied with current problems.  Plan no med changes  11/13/22 appt noted: Psych med: Auvelity  BID, gabapentin  600 pm for back pain & RLS, modafinil  300 AM, olanzapine  5 mg HS, sertraline  75 mg daily, propranolol  20 BID, alprazolam  0.5 mg prn Stress cousin bone CA really upsetting.  B needing neck surgery.  H prostate bx pending. CBD oil in drink helps her anxiety.  Just relaxes her and doesn't drive after  it or take it excessively.   Memory is not good.  Can forget what she says in conversation.  Took memory test at PCP and it was normal.  This is not new.   She feels she has maintained the benefit from Auvelity .  Now I don't want to die which is unbelievable bc has wanted to die for 50 years.  Now just starting life again.  But now is worrying about dying.  Both parents had CA and B had CA. Cont problems with RLS.  Will occur in arms as well.  Reversed sleep schedule.  Stay up all night.  Sleep 8-10  hours in day.   Still using caffeine. Plan no changes  01/20/23 appt noted: Meds as above except Reduced gabapentin  to 900 mg pm bc 1200 mg made her sleep 12 hours. Ongoing problems with RLS and not controlled.  It is at night when lays down in legs and arms when it is bad.  RLS for years.   Doubt gabapentin  is helping back pain.  No other SE. Mood is "good".  Maintained benefit. Anxiety is under control for her. Problems going to sleep.  Once asleep sleeps 9 hours.   Plan: Start pramipexole  1/2 tablet 90 min before sleep for 1 week then if RLS is not better then increase to 1 tablet in evening Reduce olanzapine  to 1/2 of 5 mg tablet to reduce risk RLS.   Disc risk mania.  Call if develop an of those sx as these changes could trigger mania but RLS is unmanageable at this time. Wean gabapentin  to 600 mg for 1 week then reduce to 1/2 tablet nightly for 1 week and then stop it DT poor resp for RLS and back pain  03/24/23 appt noted: Psych meds: Alprazolam  0.5 twice daily as needed anxiety, Auvelity  twice daily, not taking modafinil  300 every morning, pramipexole  1/2 mg in the evening, propranolol  20 twice daily, sertraline  75  .  Stopped olanzapine  and gabapentin .  Had to stop modafinil  bc couldn't sleep when stopped olanzapine .  Had hot and cold sensations for a month after stopping.  Gone now.  Didn't sleep after stopped both olanzapine  and gabapentin .   Lost wt  5# and diet is better.  Drinking shake.  Not gaining wt.     Current UTI.  Feels really weak.  Low HCT.  High platelets. Not much px with RLS now. Pretty good. I didn't realize how much a zombie I was before when on olanzapine . Mentally really good.  Physically not good.   Sleep is better now but choppy.   Used to sleep 12 hour/d and now 7-8 hour/d.  H says she seems a lot happier and more motivated.  Allergic to taking iron  pills.  Eats red meat.   Riding cycle.   No mood px stopping olanzapine .    06/23/23 appt noted: Med:  Alprazolam  0.5 twice daily as needed anxiety, Auvelity  twice daily, pramipexole  1 mg in the evening, propranolol  20 twice daily, sertraline  75  . Low ferritin and getting infusions twice.  So weak and tired and can't do much.  Vertigo returned but getting better.  CC weakness. So far infusions haven't helped. Enjoyed [place] at [employer]. H [name] promotion at work.  Working a lot of hours.  Plans to work until 63 yo.   Dep a little worse over fatigue and iron  deficiency.  Certainly not as bad as in the past.  Need to hire someone to clean house.  Discovered youtube in November and enjoys the music there.   Don't want to take  the Xanax .  Very rare alcohol.     Past psych med trials:  Wellbutrin  XL 300, Sertraline  75, fluoxetine  SE brief ,  Auvelity  marked positive response  Vraylar SI, Latuda, Abilify , olanzapine  20 Symbyax side effects Seroquel  which caused side effects of constipation,  Depakote  ER 1500mg  HS  lamotrigine,  refuses lithium  because of altered taste.    Pramipexole  1 mg in 2019? effect Modafinil  300-400  Hx primidone sed Propranolol  20 BID helps tremor and anxiety  She has a history of ataxia on 1500 mg of Depakote .   buspirone  SE hallucinations Gabapentin  for RLS  ECT 2016 without help,   Review of Systems:  Review of Systems  Constitutional:  Positive for fatigue.  Respiratory:  Positive for shortness of breath.   Cardiovascular:  Negative for chest pain.  Gastrointestinal:  Negative for abdominal pain and vomiting.  Musculoskeletal:  Positive for gait problem.  Neurological:  Positive for dizziness and tremors. Negative for weakness.       RLS  Psychiatric/Behavioral:  Positive for decreased concentration. Negative for agitation, behavioral problems, confusion, dysphoric mood, hallucinations, self-injury, sleep disturbance and suicidal ideas. The patient is nervous/anxious. The patient is not hyperactive.     Medications: I have reviewed the patient's  current medications.  Current Outpatient Medications  Medication Sig Dispense Refill   aspirin  EC 325 MG tablet Take 325 mg by mouth daily.     azelastine  (ASTELIN ) 0.1 % nasal spray Place 1 spray into both nostrils 2 (two) times daily as directed 30 mL 12   diltiazem  (CARTIA  XT) 180 MG 24 hr capsule Take 1 capsule (180 mg total) by mouth daily. 90 capsule 1   Ferric Maltol  (ACCRUFER ) 30 MG CAPS Take 1 capsule (30 mg total) by mouth daily. 30 capsule 2   fexofenadine  (ALLEGRA  ALLERGY) 180 MG tablet Take 1 tablet (180 mg total) by mouth daily.     fluticasone  (FLONASE ) 50 MCG/ACT nasal spray Place 2 sprays into the nose daily as needed. For allergies     NONFORMULARY OR COMPOUNDED ITEM sm Azelaic acid / metronidazole /ivermectin 15%/1%/1% cream apply to face bid 1 each 5   Probiotic Product (PROBIOTIC DAILY PO) Take 1 tablet by mouth daily.      RABEprazole  (ACIPHEX ) 20 MG tablet Take 1 tablet (20 mg total) by mouth daily. 90 tablet 3   tretinoin  (RETIN-A ) 0.025 % cream Apply topically at bedtime. 45 g 11   trimethoprim  (TRIMPEX ) 100 MG tablet Take 1 tablet (100 mg total) by mouth as directed after intercourse. 30 tablet 2   ALPRAZolam  (XANAX ) 0.5 MG tablet Take 1 tablet (0.5 mg total) by mouth 2 (  two) times daily as needed. 30 tablet 0   Dextromethorphan -buPROPion  ER (AUVELITY ) 45-105 MG TBCR Take 1 tablet by mouth 2 (two) times daily. 60 tablet 3   pramipexole  (MIRAPEX ) 1 MG tablet Take 1 tablet (1 mg total) by mouth every evening. 90 tablet 1   propranolol  (INDERAL ) 20 MG tablet Take 1 tablet (20 mg total) by mouth 2 (two) times daily. 180 tablet 1   sertraline  (ZOLOFT ) 50 MG tablet Take 1&1/2 tablets (75 mg total) by mouth daily. 135 tablet 1   No current facility-administered medications for this visit.    Medication Side Effects: None  Allergies:  Allergies  Allergen Reactions   Cariprazine Other (See Comments)    Patient becomes suicidal when taking this medication.  Patient  becomes suicidal when taking this medication.    Metoprolol  Other (See Comments)    Hallucinations hair falls out Hallucinations hair falls out   Nickel Rash   Iron  Nausea And Vomiting    Past Medical History:  Diagnosis Date   Anxiety    Atrial fibrillation (HCC)    a. Event monitor 2013 - PACs/bradycardia/SVT/short run of atrial fib by event monitor.    BIPOLAR DISORDER UNSPECIFIED    Bradycardia    Bursitis of hip 09/1999   Bilateral - Dr. Murrell Arrant   DEPRESSION    Emphysema lung (HCC) 06/20/2016   pt states pulmonalongist stated started recently   GERD    Headache(784.0)    was with menstrual cycle. no longer a problem   HYPERLIPIDEMIA    Hypertension    Hypertension    IRRITABLE BOWEL SYNDROME, HX OF    Menopausal state 01/2012   [facility] = 88.5   Mitral valve prolapse 12/17/2000   a. dx 1980s, most recent echo did not demonstrate this.   MYALGIA    Normal coronary arteries    a. by cardiac CT 2013.   PAT (paroxysmal atrial tachycardia) (HCC)    PE (pulmonary embolism) 02/09/2015   a. Bilateral PEs 01/2015 when d-dimer 0.69, CP lying on left side. Followed by heme-onc - + lupus anticoagulant, mildly depressed protein S. Dr. Maria Shiner is not certain if her hypercoagulable studies are significant for a thrombophilic state.   Premature atrial contractions    Pulmonary embolus (HCC) 02/10/2015   Shortness of breath    Pulmonary eval 05/2002   Thyroid  nodule 2014   Tremors of nervous system     Family History  Problem Relation Age of Onset   Lung cancer Mother    Hypertension Mother    Hyperlipidemia Mother    Cancer Father        Esophageal cancer   Hyperlipidemia Father    Depression Father    Cancer Brother    Pulmonary embolism Brother    Colon cancer Paternal Uncle    Sarcoidosis Other    Testicular cancer Other     Social History   Socioeconomic History   Marital status: Married    Spouse name: Not on file   Number of children: 0   Years of education: Not  on file   Highest education level: Associate degree: academic program  Occupational History   Occupation: Nurse-Personal Care/HH    Employer: UNEMPLOYED  Tobacco Use   Smoking status: Former    Current packs/day: 0.00    Average packs/day: 1 pack/day for 25.0 years (25.0 ttl pk-yrs)    Types: Cigarettes    Start date: 02/11/1971    Quit date: 02/11/1996    Years since quitting:  27.3   Smokeless tobacco: Never   Tobacco comments:    Married, lives with spouse. 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       No exercise   Social Drivers of Health   Financial Resource Strain: Low Risk  (04/11/2023)   Overall Financial Resource Strain (CARDIA)    Difficulty of Paying Living Expenses: Not hard at all  Food Insecurity: No Food Insecurity (04/11/2023)   Hunger Vital Sign    Worried About Running Out of Food in the Last Year: Never true    Ran Out of Food in the Last Year: Never true  Transportation Needs: Unmet Transportation Needs (04/11/2023)   PRAPARE - Administrator, Civil Service (Medical): Yes    Lack of Transportation (Non-Medical): No  Physical Activity: Unknown (04/11/2023)   Exercise Vital Sign    Days of Exercise per Week: 0 days    Minutes of Exercise per Session: Not on file  Stress: Stress Concern Present (04/11/2023)   [employer] of Occupational Health - Occupational Stress Questionnaire    Feeling of Stress : Very much  Social Connections: Moderately Isolated (04/11/2023)   Social Connection and Isolation Panel [NHANES]    Frequency of Communication with Friends and Family: Never    Frequency of Social Gatherings with Friends and Family: Never     Attends Religious Services: 1 to 4 times per year    Active Member of [employer] or Organizations: No    Attends Engineer, structural: Not on file    Marital Status: Married  Catering manager Violence: Not on file    Past Medical History, Surgical history, Social history, and Family history were reviewed and updated as appropriate.   Please see review of systems for further details on the patient's review from today.   Objective:   Physical Exam:  LMP 05/11/2012   Physical Exam Neurological:     Mental Status: She is alert and oriented to person, place, and time.     Cranial Nerves: No dysarthria.  Psychiatric:        Attention and Perception: Attention and perception normal.        Mood and Affect: Mood is anxious. Mood is not depressed. Affect is not tearful.        Speech: Speech normal.        Behavior: Behavior is cooperative.        Thought Content: Thought content normal. Thought content is not paranoid or delusional. Thought content does not include homicidal or suicidal ideation. Thought content does not include suicidal plan.        Cognition and Memory: Cognition and memory normal.     Comments: Insight and judgment fair and less impulsive Chronic anxiety and avoidant Best mood in years.   Still better with meds. Good humor.       Lab Review:     Component Value Date/Time   NA 141 04/30/2023 1540   NA 145 (H) 04/05/2020 1508   NA 142 01/21/2017 1013   NA 143 01/16/2016 1005   K 4.3 04/30/2023 1540   K  3.2 (L) 01/21/2017 1013   K 3.3 (L) 01/16/2016 1005   CL 104 04/30/2023 1540   CL 106 01/21/2017 1013   CO2 26 04/30/2023 1540   CO2 29 01/21/2017 1013   CO2 20 (L) 01/16/2016 1005   GLUCOSE 100 (H) 04/30/2023 1540   GLUCOSE 95 01/21/2017 1013   BUN 15 04/30/2023 1540   BUN 14 04/05/2020 1508   BUN 9 01/21/2017 1013   BUN 12.6 01/16/2016 1005   CREATININE 0.87 04/30/2023 1540   CREATININE 0.94 12/20/2019 1449   CREATININE 0.9 01/21/2017 1013    CREATININE 0.9 01/16/2016 1005   CALCIUM  9.9 04/30/2023 1540   CALCIUM  9.2 01/21/2017 1013   CALCIUM  9.5 01/16/2016 1005   PROT 7.3 04/30/2023 1540   PROT 7.2 04/05/2020 1508   PROT 6.9 01/21/2017 1013   PROT 7.1 01/16/2016 1005   ALBUMIN 4.5 04/30/2023 1540   ALBUMIN 4.6 04/05/2020 1508   ALBUMIN 3.6 01/16/2016 1005   AST 21 04/30/2023 1540   AST 13 (L) 12/20/2019 1449   AST 16 01/16/2016 1005   ALT 20 04/30/2023 1540   ALT 15 12/20/2019 1449   ALT 22 01/21/2017 1013   ALT 24 01/16/2016 1005   ALKPHOS 91 04/30/2023 1540   ALKPHOS 62 01/21/2017 1013   ALKPHOS 81 01/16/2016 1005   BILITOT 0.3 04/30/2023 1540   BILITOT <0.2 04/05/2020 1508   BILITOT 0.3 12/20/2019 1449   BILITOT 0.59 01/16/2016 1005   GFRNONAA >60 04/13/2023 1530   GFRNONAA >60 12/20/2019 1449   GFRAA 87 04/05/2020 1508   GFRAA >60 12/03/2018 1346       Component Value Date/Time   WBC 8.0 06/04/2023 1603   RBC 5.20 (H) 06/04/2023 1603   HGB 12.3 06/04/2023 1603   HGB 13.7 04/05/2020 1508   HGB 12.8 01/21/2017 1013   HCT 39.4 06/04/2023 1603   HCT 41.4 04/05/2020 1508   HCT 38.2 01/21/2017 1013   PLT 344.0 06/04/2023 1603   PLT 289 04/05/2020 1508   MCV 75.7 (L) 06/04/2023 1603   MCV 85 04/05/2020 1508   MCV 90 01/21/2017 1013   MCH 22.8 (L) 04/13/2023 1530   MCHC 31.1 06/04/2023 1603   RDW 20.5 (H) 06/04/2023 1603   RDW 15.0 04/05/2020 1508   RDW 14.5 01/21/2017 1013   LYMPHSABS 2.1 06/04/2023 1603   LYMPHSABS 2.5 04/05/2020 1508   LYMPHSABS 3.6 (H) 01/21/2017 1013   MONOABS 0.4 06/04/2023 1603   EOSABS 0.2 06/04/2023 1603   EOSABS 0.1 04/05/2020 1508   EOSABS 0.5 01/21/2017 1013   BASOSABS 0.1 06/04/2023 1603   BASOSABS 0.1 04/05/2020 1508   BASOSABS 0.0 01/21/2017 1013    No results found for: "POCLITH", "LITHIUM "   Lab Results  Component Value Date   PHENYTOIN <0.5 ug/mL (L) 11/05/2006   VALPROATE 73.7 11/25/2016    03/23/23 ferritin low at 7.   .res Assessment: Plan:     Hope was seen today for follow-up, depression, anxiety and fatigue.  Diagnoses and all orders for this visit:  Major depressive disorder, recurrent episode, moderate (HCC) -     sertraline  (ZOLOFT ) 50 MG tablet; Take 1&1/2 tablets (75 mg total) by mouth daily. -     Dextromethorphan -buPROPion  ER (AUVELITY ) 45-105 MG TBCR; Take 1 tablet by mouth 2 (two) times daily.  Generalized anxiety disorder -     sertraline  (ZOLOFT ) 50 MG tablet; Take 1&1/2 tablets (75 mg total) by mouth daily.  Panic disorder with agoraphobia -     sertraline  (  ZOLOFT ) 50 MG tablet; Take 1&1/2 tablets (75 mg total) by mouth daily. -     ALPRAZolam  (XANAX ) 0.5 MG tablet; Take 1 tablet (0.5 mg total) by mouth 2 (two) times daily as needed.  Claustrophobia  Uncontrolled restless legs syndrome -     pramipexole  (MIRAPEX ) 1 MG tablet; Take 1 tablet (1 mg total) by mouth every evening.  Benign essential tremor  Agoraphobia -     ALPRAZolam  (XANAX ) 0.5 MG tablet; Take 1 tablet (0.5 mg total) by mouth 2 (two) times daily as needed.  Hoarding behavior  Tremor -     propranolol  (INDERAL ) 20 MG tablet; Take 1 tablet (20 mg total) by mouth 2 (two) times daily.  Iron  deficiency anemia due to chronic blood loss   TRD.  Lifelong history of chronic anhedonic depression and anxiety with very poor functioning . She is highly dysfunctional historically but much better with Auvelity .  She is not currently manic.  She is tolerating the medications.  She has had multiple medication failures as noted above.  Extremely complicated patient.  It is not entirely clear to me as towhether she truly has bipolar depression or TR major depression.  She has never been manic while under my care.  She is not worse with less mood stabilizer.  Disc risk mania if she is actually bipolar.  She doesn't want more mood stabilizer trials.  However her depression and treatment resistant anxiety were improved with olanzapine  and sertraline  in  combination. Best response ever with current med regimen including with Auvelity  addition.   Has been able to stop olanzapine  and is pleased she is can lose weight now and she is not as sedated and sleeping more normal hours.  She does not need to take the modafinil  now.  She apparently had some withdrawal coming off of the gabapentin  which lasted for several weeks but it has resolved.  Mood much better with current combo her depression still well-controlled and anxiety is adequately controlled.  She is not having manic symptoms or other mood swings.  No problems off the olanzapine  except no appetite  RLS is unmanaged again but started iron   Caffeine can worsen RLS.  Disc timing in relation to olanzapine .  Discussed potential metabolic side effects associated with atypical antipsychotics, as well as potential risk for movement side effects. Advised pt to contact office if movement side effects occur.  For tremor is considering increasing the propranolol  continue 20 BID She'll check with cardiologist on this.  Push fluids bc so much Time in bed and past history of dehydration. Disc risk propranolol  but it helps tremor.  Better sleep hygiene.    No med changes:  Continue Alprazolam  0.5 mg twice daily as needed,  Auvelity  twice daily, ,  Now that she's off olanzapine  she doesn't need modafinil , but consider retrying bc fatigue from anemia. sertraline  75 mg daily and consider weaning propranolol  20 mg twice daily for tremor is controlled  For RLS, rec continue iron .  She can't take iron  supplements per pt.  Disc this in detail.  She started iron  infusions.    FU 4 mos  Nori Beat, MD, DFAPA   Please see After Visit Summary for patient specific instructions.  Future Appointments  Date Time Provider Department Center  06/26/2023  1:00 PM [facility] INFUSION [facility] None  07/16/2023  3:00 PM Bauert, Quilla Brunswick, LCSW [facility] None  08/03/2023  2:30 PM [facility] LAB [facility] None  08/03/2023  2:45  PM Tish Forge, NP [facility] None  08/13/2023  3:00 PM Bauert, Quilla Brunswick, LCSW [facility] None  08/19/2023  4:00 PM Dellar Fenton, DO [facility] None    No orders of the defined types were placed in this encounter.     -------------------------------

## 2023-06-26 ENCOUNTER — Inpatient Hospital Stay

## 2023-06-26 VITALS — BP 103/56 | HR 65 | Temp 97.7°F | Resp 18

## 2023-06-26 DIAGNOSIS — D509 Iron deficiency anemia, unspecified: Secondary | ICD-10-CM

## 2023-06-26 MED ORDER — SODIUM CHLORIDE 0.9 % IV SOLN
INTRAVENOUS | Status: DC
Start: 2023-06-26 — End: 2023-06-26

## 2023-06-26 MED ORDER — SODIUM CHLORIDE 0.9 % IV SOLN
300.0000 mg | Freq: Once | INTRAVENOUS | Status: AC
  Administered 2023-06-26: 300 mg via INTRAVENOUS
  Filled 2023-06-26: qty 300

## 2023-06-26 NOTE — Patient Instructions (Signed)

## 2023-07-13 ENCOUNTER — Ambulatory Visit: Admitting: Family

## 2023-07-13 ENCOUNTER — Inpatient Hospital Stay

## 2023-07-14 ENCOUNTER — Ambulatory Visit: Admitting: Psychology

## 2023-07-15 ENCOUNTER — Telehealth: Payer: Self-pay | Admitting: Psychiatry

## 2023-07-15 NOTE — Telephone Encounter (Signed)
 Pt lvm that she has been taking the auvelity  45 2x daily. She is having side effects like her libido is to high. She would like to go down to 1 tab a day. Please call her at [phone] and let her know if dr. Toi Foster will agree to the decrease

## 2023-07-16 ENCOUNTER — Ambulatory Visit: Admitting: Psychology

## 2023-07-16 ENCOUNTER — Other Ambulatory Visit (HOSPITAL_COMMUNITY): Payer: Self-pay

## 2023-07-16 DIAGNOSIS — F331 Major depressive disorder, recurrent, moderate: Secondary | ICD-10-CM

## 2023-07-16 NOTE — Telephone Encounter (Signed)
 Pt notified of recommendation to decrease Auvelity  to QAM and update us  if increased depression.

## 2023-07-16 NOTE — Telephone Encounter (Signed)
 Ok to reduce to 1 in the morning.  Let us  know if she gets more depressed.

## 2023-07-16 NOTE — Progress Notes (Signed)
 [facility] Counselor/Therapist Progress Note  Patient ID: BEATA BEASON, MRN: [id number],    Date: 07/16/2023  Time Spent: 3:00pm-3:55pm   55 minutes   Treatment Type: Individual Therapy  Reported Symptoms: stress  Mental Status Exam: Appearance:  Casual     Behavior: Appropriate  Motor: Normal  Speech/Language:  Normal Rate  Affect: Appropriate  Mood: normal  Thought process: normal  Thought content:   WNL  Sensory/Perceptual disturbances:   WNL  Orientation: oriented to person, place, time/date, and situation  Attention: Good  Concentration: Good  Memory: WNL  Fund of knowledge:  Good  Insight:   Good  Judgment:  Good  Impulse Control: Good   Risk Assessment: Danger to Self:  No Self-injurious Behavior: No Danger to Others: No Duty to Warn:no Physical Aggression / Violence:No  Access to Firearms a concern: No  Gang Involvement:No   Subjective: Pt present for face-to-face individual therapy via video.  Pt consents to telehealth video session and is aware of limitations and benefits of virtual sessions.  Location of pt: home Location of therapist: home office.  Pt talked about learning that she is an empath and that she has no boundaries regarding other people's emotions.  Pt states part of being an empath is having a sensitive sense of smell and hearing.   Smells and sounds bother her.   Pt states she wishes she understood this years ago bc it would have helped her understand her feelings. Pt has been in bed a lot and watching tv and youtube.   Pt has engaged in messaging online relationships with a couple of people and has had trouble keeping healthy boundaries.  Addressed the interactions pt has had and how they have impacted her.   Pt feels like she has been living in a fantasy.   Addressed how pt can ground herself into the present moment and re engage more in real life social interactions.   Addressed how pt can connect more with her [name].    Worked on self care strategies. Provided supportive therapy.    Interventions: Cognitive Behavioral Therapy and Insight-Oriented  Diagnosis:F33.1  Plan of Care: Recommend ongoing therapy.   Pt participated in setting treatment goals.  Plan to meet monthly.  Pt agrees with treatment plan.  Treatment Plan Client Abilities/Strengths  Pt is bright, engaging, and motivated for therapy.   Client Treatment Preferences  Individual therapy.  Client Statement of Needs  Improve coping skills.  Symptoms  Depressed or irritable mood. Diminished interest in or enjoyment of activities. Lack of energy. Poor concentration and indecisiveness. Social withdrawal.  Problems Addressed  Unipolar Depression Goals 1. Alleviate depressive symptoms and return to previous level of effective functioning. 2. Appropriately grieve the loss in order to normalize mood and to return to previously adaptive level of functioning. Objective Learn and implement behavioral strategies to overcome depression. Target Date: 2023-12-02 Frequency: monthly  Progress: 55 Modality: individual  Related Interventions Engage the client in "behavioral activation," increasing his/her activity level and contact with sources of reward, while identifying processes that inhibit activation.  Use behavioral techniques such as instruction, rehearsal, role-playing, role reversal, as needed, to facilitate activity in the client's daily life; reinforce success. Assist the client in developing skills that increase the likelihood of deriving pleasure from behavioral activation (e.g., assertiveness skills, developing an exercise plan, less internal/more external focus, increased social involvement); reinforce success. Objective Identify important people in life, past and present, and describe the quality, good and poor, of those  relationships. Target Date: 2023-12-02 Frequency: monthly  Progress: 55 Modality: individual  Related  Interventions Conduct Interpersonal Therapy beginning with the assessment of the client's "interpersonal inventory" of important past and present relationships; develop a case formulation linking depression to grief, interpersonal role disputes, role transitions, and/or interpersonal deficits). Objective Learn and implement problem-solving and decision-making skills. Target Date: 2023-12-02 Frequency: monthly  Progress: 55 Modality: individual  Related Interventions Conduct Problem-Solving Therapy using techniques such as psychoeducation, modeling, and role-playing to teach client problem-solving skills (i.e., defining a problem specifically, generating possible solutions, evaluating the pros and cons of each solution, selecting and implementing a plan of action, evaluating the efficacy of the plan, accepting or revising the plan); role-play application of the problem-solving skill to a real life issue. Encourage in the client the development of a positive problem orientation in which problems and solving them are viewed as a natural part of life and not something to be feared, despaired, or avoided. 3. Develop healthy interpersonal relationships that lead to the alleviation and help prevent the relapse of depression. 4. Develop healthy thinking patterns and beliefs about self, others, and the world that lead to the alleviation and help prevent the relapse of depression. 5. Recognize, accept, and cope with feelings of depression. Diagnosis F33.1  Conditions For Discharge Achievement of treatment goals and objectives   [name], LCSW

## 2023-07-16 NOTE — Telephone Encounter (Signed)
 Pt is currently on Auvelity . She is asking to decrease dose to once a day. She is reporting increased libido on BID dosing. She had the same issue when she was on Wellbutrin  alone. It looks like she has been on Auvelity  for more than a year.

## 2023-07-17 ENCOUNTER — Other Ambulatory Visit (HOSPITAL_BASED_OUTPATIENT_CLINIC_OR_DEPARTMENT_OTHER): Payer: Self-pay

## 2023-07-17 ENCOUNTER — Inpatient Hospital Stay: Attending: Hematology & Oncology

## 2023-07-17 ENCOUNTER — Inpatient Hospital Stay: Admitting: Family

## 2023-07-17 VITALS — BP 105/56 | HR 90 | Temp 97.4°F | Resp 17 | Ht 69.0 in | Wt 164.0 lb

## 2023-07-17 DIAGNOSIS — Z79899 Other long term (current) drug therapy: Secondary | ICD-10-CM | POA: Diagnosis not present

## 2023-07-17 DIAGNOSIS — K589 Irritable bowel syndrome without diarrhea: Secondary | ICD-10-CM | POA: Diagnosis not present

## 2023-07-17 DIAGNOSIS — D509 Iron deficiency anemia, unspecified: Secondary | ICD-10-CM | POA: Insufficient documentation

## 2023-07-17 DIAGNOSIS — D6862 Lupus anticoagulant syndrome: Secondary | ICD-10-CM | POA: Insufficient documentation

## 2023-07-17 DIAGNOSIS — Z86711 Personal history of pulmonary embolism: Secondary | ICD-10-CM | POA: Diagnosis not present

## 2023-07-17 LAB — CBC WITH DIFFERENTIAL (CANCER CENTER ONLY)
Abs Immature Granulocytes: 0.05 10*3/uL (ref 0.00–0.07)
Basophils Absolute: 0.1 10*3/uL (ref 0.0–0.1)
Basophils Relative: 1 %
Eosinophils Absolute: 0.2 10*3/uL (ref 0.0–0.5)
Eosinophils Relative: 2 %
HCT: 46.6 % — ABNORMAL HIGH (ref 36.0–46.0)
Hemoglobin: 14.7 g/dL (ref 12.0–15.0)
Immature Granulocytes: 1 %
Lymphocytes Relative: 35 %
Lymphs Abs: 2.8 10*3/uL (ref 0.7–4.0)
MCH: 25.8 pg — ABNORMAL LOW (ref 26.0–34.0)
MCHC: 31.5 g/dL (ref 30.0–36.0)
MCV: 81.8 fL (ref 80.0–100.0)
Monocytes Absolute: 0.4 10*3/uL (ref 0.1–1.0)
Monocytes Relative: 5 %
Neutro Abs: 4.4 10*3/uL (ref 1.7–7.7)
Neutrophils Relative %: 56 %
Platelet Count: 274 10*3/uL (ref 150–400)
RBC: 5.7 MIL/uL — ABNORMAL HIGH (ref 3.87–5.11)
RDW: 19.9 % — ABNORMAL HIGH (ref 11.5–15.5)
WBC Count: 7.8 10*3/uL (ref 4.0–10.5)
nRBC: 0 % (ref 0.0–0.2)

## 2023-07-17 LAB — RETICULOCYTES
Immature Retic Fract: 5.6 % (ref 2.3–15.9)
RBC.: 5.67 MIL/uL — ABNORMAL HIGH (ref 3.87–5.11)
Retic Count, Absolute: 83.3 10*3/uL (ref 19.0–186.0)
Retic Ct Pct: 1.5 % (ref 0.4–3.1)

## 2023-07-17 LAB — IRON AND IRON BINDING CAPACITY (CC-WL,HP ONLY)
Iron: 100 ug/dL (ref 28–170)
Saturation Ratios: 28 % (ref 10.4–31.8)
TIBC: 361 ug/dL (ref 250–450)
UIBC: 261 ug/dL (ref 148–442)

## 2023-07-17 LAB — FERRITIN: Ferritin: 286 ng/mL (ref 11–307)

## 2023-07-17 MED ORDER — ONDANSETRON HCL 4 MG PO TABS
4.0000 mg | ORAL_TABLET | Freq: Three times a day (TID) | ORAL | 0 refills | Status: DC | PRN
  Filled 2023-07-17: qty 21, 7d supply, fill #0

## 2023-07-17 NOTE — Progress Notes (Signed)
 Hematology and Oncology Follow Up Visit  Beverly Cole [id number] 24-Nov-1960 63 y.o. 07/17/2023   Principle Diagnosis:  Iron  deficiency anemia  History of bilateral PE with positive lupus anticoagulant and mildly depressed protein S level both of which resolved    Current Therapy:        IV iron  as indicated Full dose aspirin  once daily   Interim History:  Beverly Cole is here today with her husband for follow-up. She is having problems with nausea and vomiting when trying to eat which has effected her appetite. Pepto has not helped. We will have her try Zofran  and see if this gives her some relief.  No fever, chills, cough, rash, chest pain, palpitations, abdominal pain or changes in bowel or bladder habits at this time.  She has history of IBS-D unchanged from baseline.  No swelling or tingling in her extremities.  No falls or syncope.  No obvious blood loss noted. No abnormal bruising, no petechiae.   ECOG Performance Status: 1 - Symptomatic but completely ambulatory  Medications:  Allergies as of 07/17/2023       Reactions   Cariprazine Other (See Comments)   Patient becomes suicidal when taking this medication.  Patient becomes suicidal when taking this medication.    Metoprolol  Other (See Comments)   Hallucinations hair falls out Hallucinations hair falls out   Nickel Rash   Iron  Nausea And Vomiting        Medication List        Accurate as of July 17, 2023  1:02 PM. If you have any questions, ask your nurse or doctor.          STOP taking these medications    ACCRUFeR  30 MG Caps Generic drug: Ferric Maltol  Stopped by: Kennard Pea       TAKE these medications    ALPRAZolam  0.5 MG tablet Commonly known as: XANAX  Take 1 tablet (0.5 mg total) by mouth 2 (two) times daily as needed.   aspirin  EC 325 MG tablet Take 325 mg by mouth daily.   Auvelity  45-105 MG Tbcr Generic drug: Dextromethorphan -buPROPion  ER Take 1 tablet by mouth 2 (two) times daily.    Azelastine  HCl 137 MCG/SPRAY Soln Place 1 spray into both nostrils 2 (two) times daily as directed   diltiazem  180 MG 24 hr capsule Commonly known as: Cartia  XT Take 1 capsule (180 mg total) by mouth daily.   fexofenadine  180 MG tablet Commonly known as: Allegra  Allergy Take 1 tablet (180 mg total) by mouth daily.   fluticasone  50 MCG/ACT nasal spray Commonly known as: FLONASE  Place 2 sprays into the nose daily as needed. For allergies   NONFORMULARY OR COMPOUNDED ITEM sm Azelaic acid / metronidazole /ivermectin 15%/1%/1% cream apply to face bid   pramipexole  1 MG tablet Commonly known as: MIRAPEX  Take 1 tablet (1 mg total) by mouth every evening.   PROBIOTIC DAILY PO Take 1 tablet by mouth daily.   propranolol  20 MG tablet Commonly known as: INDERAL  Take 1 tablet (20 mg total) by mouth 2 (two) times daily.   RABEprazole  20 MG tablet Commonly known as: ACIPHEX  Take 1 tablet (20 mg total) by mouth daily.   sertraline  50 MG tablet Commonly known as: ZOLOFT  Take 1&1/2 tablets (75 mg total) by mouth daily.   tretinoin  0.025 % cream Commonly known as: RETIN-A  Apply topically at bedtime.   trimethoprim  100 MG tablet Commonly known as: TRIMPEX  Take 1 tablet (100 mg total) by mouth as directed after intercourse.  Allergies:  Allergies  Allergen Reactions   Cariprazine Other (See Comments)    Patient becomes suicidal when taking this medication.  Patient becomes suicidal when taking this medication.    Metoprolol  Other (See Comments)    Hallucinations hair falls out Hallucinations hair falls out   Nickel Rash   Iron  Nausea And Vomiting    Past Medical History, Surgical history, Social history, and Family History were reviewed and updated.  Review of Systems: All other 10 point review of systems is negative.   Physical Exam:  height is 5\' 9"  (1.753 m) and weight is 164 lb (74.4 kg). Her temperature is 97.4 F (36.3 C) (abnormal). Her blood pressure is  105/56 (abnormal) and her pulse is 90. Her respiration is 17 and oxygen saturation is 99%.   Wt Readings from Last 3 Encounters:  07/17/23 164 lb (74.4 kg)  05/18/23 172 lb (78 kg)  04/30/23 172 lb 9.6 oz (78.3 kg)    Ocular: Sclerae unicteric, pupils equal, round and reactive to light Ear-nose-throat: Oropharynx clear, dentition fair Lymphatic: No cervical or supraclavicular adenopathy Lungs no rales or rhonchi, good excursion bilaterally Heart regular rate and rhythm, no murmur appreciated Abd soft, nontender, positive bowel sounds MSK no focal spinal tenderness, no joint edema Neuro: non-focal, well-oriented, appropriate affect Breasts: Deferred   Lab Results  Component Value Date   WBC 7.8 07/17/2023   HGB 14.7 07/17/2023   HCT 46.6 (H) 07/17/2023   MCV 81.8 07/17/2023   PLT 274 07/17/2023   Lab Results  Component Value Date   FERRITIN 10.3 06/04/2023   IRON  42 06/04/2023   TIBC 477.4 (H) 06/04/2023   IRONPCTSAT 8.8 (L) 06/04/2023   Lab Results  Component Value Date   RETICCTPCT 1.5 07/17/2023   RBC 5.67 (H) 07/17/2023   RETICCTABS 82,240 (H) 06/04/2023   No results found for: "KPAFRELGTCHN", "LAMBDASER", "KAPLAMBRATIO" No results found for: "IGGSERUM", "IGA", "IGMSERUM" No results found for: "TOTALPROTELP", "ALBUMINELP", "A1GS", "A2GS", "BETS", "BETA2SER", "GAMS", "MSPIKE", "SPEI"   Chemistry      Component Value Date/Time   NA 141 04/30/2023 1540   NA 145 (H) 04/05/2020 1508   NA 142 01/21/2017 1013   NA 143 01/16/2016 1005   K 4.3 04/30/2023 1540   K 3.2 (L) 01/21/2017 1013   K 3.3 (L) 01/16/2016 1005   CL 104 04/30/2023 1540   CL 106 01/21/2017 1013   CO2 26 04/30/2023 1540   CO2 29 01/21/2017 1013   CO2 20 (L) 01/16/2016 1005   BUN 15 04/30/2023 1540   BUN 14 04/05/2020 1508   BUN 9 01/21/2017 1013   BUN 12.6 01/16/2016 1005   CREATININE 0.87 04/30/2023 1540   CREATININE 0.94 12/20/2019 1449   CREATININE 0.9 01/21/2017 1013   CREATININE 0.9  01/16/2016 1005      Component Value Date/Time   CALCIUM  9.9 04/30/2023 1540   CALCIUM  9.2 01/21/2017 1013   CALCIUM  9.5 01/16/2016 1005   ALKPHOS 91 04/30/2023 1540   ALKPHOS 62 01/21/2017 1013   ALKPHOS 81 01/16/2016 1005   AST 21 04/30/2023 1540   AST 13 (L) 12/20/2019 1449   AST 16 01/16/2016 1005   ALT 20 04/30/2023 1540   ALT 15 12/20/2019 1449   ALT 22 01/21/2017 1013   ALT 24 01/16/2016 1005   BILITOT 0.3 04/30/2023 1540   BILITOT <0.2 04/05/2020 1508   BILITOT 0.3 12/20/2019 1449   BILITOT 0.59 01/16/2016 1005       Impression and Plan: Beverly Cole  is a pleasant 62 yo caucasian female with IDA not responsive to oral iron .  Iron  studies are pending. We will replace if needed.  Zofran  sent downstairs per patient request.  Follow-up in 3 months.     Kennard Pea, NP 6/6/20251:02 PM

## 2023-07-21 ENCOUNTER — Other Ambulatory Visit (HOSPITAL_COMMUNITY): Payer: Self-pay

## 2023-07-21 ENCOUNTER — Other Ambulatory Visit: Payer: Self-pay

## 2023-07-23 ENCOUNTER — Ambulatory Visit: Admitting: Psychology

## 2023-07-23 DIAGNOSIS — F331 Major depressive disorder, recurrent, moderate: Secondary | ICD-10-CM

## 2023-07-23 NOTE — Progress Notes (Signed)
 [facility] Counselor/Therapist Progress Note  Patient ID: Beverly Cole, MRN: [id number],    Date: 07/23/2023  Time Spent: 3:00pm-3:50pm   50 minutes   Treatment Type: Individual Therapy  Reported Symptoms: stress  Mental Status Exam: Appearance:  Casual     Behavior: Appropriate  Motor: Normal  Speech/Language:  Normal Rate  Affect: Appropriate  Mood: normal  Thought process: normal  Thought content:   WNL  Sensory/Perceptual disturbances:   WNL  Orientation: oriented to person, place, time/date, and situation  Attention: Good  Concentration: Good  Memory: WNL  Fund of knowledge:  Good  Insight:   Good  Judgment:  Good  Impulse Control: Good   Risk Assessment: Danger to Self:  No Self-injurious Behavior: No Danger to Others: No Duty to Warn:no Physical Aggression / Violence:No  Access to Firearms a concern: No  Gang Involvement:No   Subjective: Pt present for face-to-face individual therapy via video.  Pt consents to telehealth video session and is aware of limitations and benefits of virtual sessions.  Location of pt: home Location of therapist: home office.  Pt talked about her abandonment issues and tendency to cling to people.   Pt states she blame this on and she cried for years about her mother getting rid of her cat when pt was a child.   Pt was tearful as she talked about the loss. Pt is still communicating with a guy on you tube.   He is not messaging pt back.  Pt is watching his videos and is obsessed about him.  Pt thinks his songs are directed at her.    Pt is having trouble keeping healthy boundaries.  Pt is ruminating and obsessing about a guy on line.  Addressed the thoughts pt has had and how they have impacted her.   Pt feels like she has been living in a fantasy.   Addressed how pt can ground herself into the present moment and re engage more in real life social interactions.   Worked on healthy boundary setting.   Addressed how pt can  connect more with her [name].  Pt states she feels obsessed with sex and thinks it's related to her psych meds.  She has contacted her psychiatrist who is making medication adjustments.   Worked on self care strategies. Provided supportive therapy.    Interventions: Cognitive Behavioral Therapy and Insight-Oriented  Diagnosis:F33.1  Plan of Care: Recommend ongoing therapy.   Pt participated in setting treatment goals.  Plan to meet monthly.  Pt agrees with treatment plan.  Treatment Plan Client Abilities/Strengths  Pt is bright, engaging, and motivated for therapy.   Client Treatment Preferences  Individual therapy.  Client Statement of Needs  Improve coping skills.  Symptoms  Depressed or irritable mood. Diminished interest in or enjoyment of activities. Lack of energy. Poor concentration and indecisiveness. Social withdrawal.  Problems Addressed  Unipolar Depression Goals 1. Alleviate depressive symptoms and return to previous level of effective functioning. 2. Appropriately grieve the loss in order to normalize mood and to return to previously adaptive level of functioning. Objective Learn and implement behavioral strategies to overcome depression. Target Date: 2023-12-02 Frequency: monthly  Progress: 55 Modality: individual  Related Interventions Engage the client in behavioral activation, increasing his/her activity level and contact with sources of reward, while identifying processes that inhibit activation.  Use behavioral techniques such as instruction, rehearsal, role-playing, role reversal, as needed, to facilitate activity in the client's daily life; reinforce success. Assist the client in  developing skills that increase the likelihood of deriving pleasure from behavioral activation (e.g., assertiveness skills, developing an exercise plan, less internal/more external focus, increased social involvement); reinforce success. Objective Identify important people in  life, past and present, and describe the quality, good and poor, of those relationships. Target Date: 2023-12-02 Frequency: monthly  Progress: 55 Modality: individual  Related Interventions Conduct Interpersonal Therapy beginning with the assessment of the client's interpersonal inventory of important past and present relationships; develop a case formulation linking depression to grief, interpersonal role disputes, role transitions, and/or interpersonal deficits). Objective Learn and implement problem-solving and decision-making skills. Target Date: 2023-12-02 Frequency: monthly  Progress: 55 Modality: individual  Related Interventions Conduct Problem-Solving Therapy using techniques such as psychoeducation, modeling, and role-playing to teach client problem-solving skills (i.e., defining a problem specifically, generating possible solutions, evaluating the pros and cons of each solution, selecting and implementing a plan of action, evaluating the efficacy of the plan, accepting or revising the plan); role-play application of the problem-solving skill to a real life issue. Encourage in the client the development of a positive problem orientation in which problems and solving them are viewed as a natural part of life and not something to be feared, despaired, or avoided. 3. Develop healthy interpersonal relationships that lead to the alleviation and help prevent the relapse of depression. 4. Develop healthy thinking patterns and beliefs about self, others, and the world that lead to the alleviation and help prevent the relapse of depression. 5. Recognize, accept, and cope with feelings of depression. Diagnosis F33.1  Conditions For Discharge Achievement of treatment goals and objectives   [name], LCSW

## 2023-07-28 ENCOUNTER — Ambulatory Visit (INDEPENDENT_AMBULATORY_CARE_PROVIDER_SITE_OTHER): Admitting: Psychology

## 2023-07-28 DIAGNOSIS — F331 Major depressive disorder, recurrent, moderate: Secondary | ICD-10-CM

## 2023-07-28 NOTE — Progress Notes (Signed)
 [facility] Counselor/Therapist Progress Note  Patient ID: LIESA TSAN, MRN: [id number],    Date: 07/28/2023  Time Spent: 9:00am-9:55am   55 minutes   Treatment Type: Individual Therapy  Reported Symptoms: stress  Mental Status Exam: Appearance:  Casual     Behavior: Appropriate  Motor: Normal  Speech/Language:  Normal Rate  Affect: Appropriate  Mood: normal  Thought process: normal  Thought content:   WNL  Sensory/Perceptual disturbances:   WNL  Orientation: oriented to person, place, time/date, and situation  Attention: Good  Concentration: Good  Memory: WNL  Fund of knowledge:  Good  Insight:   Good  Judgment:  Good  Impulse Control: Good   Risk Assessment: Danger to Self:  No Self-injurious Behavior: No Danger to Others: No Duty to Warn:no Physical Aggression / Violence:No  Access to Firearms a concern: No  Gang Involvement:No   Subjective: Pt present for face-to-face individual therapy via video.  Pt consents to telehealth video session and is aware of limitations and benefits of virtual sessions.  Location of pt: home Location of therapist: home office.  Pt was still communicating with a guy John on you tube.  She was contacting the guy every night.  He banned pt from following him on you tube.  This was upsetting to pt. Worked with pt on letting go of [employer] and putting that connection behind her.   Pt decided to take a break from you tube and social media last night.   Pt has also connected with a woman on you tube and have become friends since January.   Pt does not trust her and the woman has a lot of mental health problems.  Pt has had to set boundaries with this woman.  Pt is having trouble keeping healthy boundaries.  Worked on healthy boundary setting.   Pt wants to talk with her [name] about what has been going on.   Addressed how pt can communicate the issue effectively.   Helped pt process her feelings.    Worked on self care  strategies. Provided supportive therapy.    Interventions: Cognitive Behavioral Therapy and Insight-Oriented  Diagnosis:F33.1  Plan of Care: Recommend ongoing therapy.   Pt participated in setting treatment goals.  Plan to meet monthly.  Pt agrees with treatment plan.  Treatment Plan Client Abilities/Strengths  Pt is bright, engaging, and motivated for therapy.   Client Treatment Preferences  Individual therapy.  Client Statement of Needs  Improve coping skills.  Symptoms  Depressed or irritable mood. Diminished interest in or enjoyment of activities. Lack of energy. Poor concentration and indecisiveness. Social withdrawal.  Problems Addressed  Unipolar Depression Goals 1. Alleviate depressive symptoms and return to previous level of effective functioning. 2. Appropriately grieve the loss in order to normalize mood and to return to previously adaptive level of functioning. Objective Learn and implement behavioral strategies to overcome depression. Target Date: 2023-12-02 Frequency: monthly  Progress: 55 Modality: individual  Related Interventions Engage the client in behavioral activation, increasing his/her activity level and contact with sources of reward, while identifying processes that inhibit activation.  Use behavioral techniques such as instruction, rehearsal, role-playing, role reversal, as needed, to facilitate activity in the client's daily life; reinforce success. Assist the client in developing skills that increase the likelihood of deriving pleasure from behavioral activation (e.g., assertiveness skills, developing an exercise plan, less internal/more external focus, increased social involvement); reinforce success. Objective Identify important people in life, past and present, and describe the quality, good  and poor, of those relationships. Target Date: 2023-12-02 Frequency: monthly  Progress: 55 Modality: individual  Related Interventions Conduct  Interpersonal Therapy beginning with the assessment of the client's interpersonal inventory of important past and present relationships; develop a case formulation linking depression to grief, interpersonal role disputes, role transitions, and/or interpersonal deficits). Objective Learn and implement problem-solving and decision-making skills. Target Date: 2023-12-02 Frequency: monthly  Progress: 55 Modality: individual  Related Interventions Conduct Problem-Solving Therapy using techniques such as psychoeducation, modeling, and role-playing to teach client problem-solving skills (i.e., defining a problem specifically, generating possible solutions, evaluating the pros and cons of each solution, selecting and implementing a plan of action, evaluating the efficacy of the plan, accepting or revising the plan); role-play application of the problem-solving skill to a real life issue. Encourage in the client the development of a positive problem orientation in which problems and solving them are viewed as a natural part of life and not something to be feared, despaired, or avoided. 3. Develop healthy interpersonal relationships that lead to the alleviation and help prevent the relapse of depression. 4. Develop healthy thinking patterns and beliefs about self, others, and the world that lead to the alleviation and help prevent the relapse of depression. 5. Recognize, accept, and cope with feelings of depression. Diagnosis F33.1  Conditions For Discharge Achievement of treatment goals and objectives   [name], LCSW

## 2023-08-03 ENCOUNTER — Ambulatory Visit (INDEPENDENT_AMBULATORY_CARE_PROVIDER_SITE_OTHER): Admitting: Psychology

## 2023-08-03 ENCOUNTER — Inpatient Hospital Stay

## 2023-08-03 ENCOUNTER — Inpatient Hospital Stay: Admitting: Family

## 2023-08-03 DIAGNOSIS — F331 Major depressive disorder, recurrent, moderate: Secondary | ICD-10-CM

## 2023-08-03 NOTE — Progress Notes (Signed)
 [facility] Counselor/Therapist Progress Note  Patient ID: Beverly Cole, MRN: [id number],    Date: 08/03/2023  Time Spent: 11:00am-11:55am   55 minutes   Treatment Type: Individual Therapy  Reported Symptoms: stress  Mental Status Exam: Appearance:  Casual     Behavior: Appropriate  Motor: Normal  Speech/Language:  Normal Rate  Affect: Appropriate  Mood: normal  Thought process: normal  Thought content:   WNL  Sensory/Perceptual disturbances:   WNL  Orientation: oriented to person, place, time/date, and situation  Attention: Good  Concentration: Good  Memory: WNL  Fund of knowledge:  Good  Insight:   Good  Judgment:  Good  Impulse Control: Good   Risk Assessment: Danger to Self:  No Self-injurious Behavior: No Danger to Others: No Duty to Warn:no Physical Aggression / Violence:No  Access to Firearms a concern: No  Gang Involvement:No   Subjective: Pt present for face-to-face individual therapy via video.  Pt consents to telehealth video session and is aware of limitations and benefits of virtual sessions.  Location of pt: home Location of therapist: home office.  Pt talked with her [name] about what has been going on with her connection with the guy online.  Pt told [name] all about her interactions and feelings and [name] was very understanding.  Pt states it was weird that [name] was so understanding.   She expected him to tell her to not do it again.   Helped pt process her feelings.   Pt has realized that she needs [name] to spend more time with her and she needs to get out of the house.  Addressed how pt can set a goal to get out of the house more.   Pt has been connecting with more people online.  She is messaging another guy.  Pt is realizing that she is lonely and more vulnerable to people online.  Addressed how pt can deal with her loneliness in healthy ways.  Worked with pt on how she can start going places in small steps.  She will start with driving to  the grocery store that is a couple of miles from her house.   Worked on self care strategies. Provided supportive therapy.    Interventions: Cognitive Behavioral Therapy and Insight-Oriented  Diagnosis:F33.1  Plan of Care: Recommend ongoing therapy.   Pt participated in setting treatment goals.  Plan to meet monthly.  Pt agrees with treatment plan.  Treatment Plan Client Abilities/Strengths  Pt is bright, engaging, and motivated for therapy.   Client Treatment Preferences  Individual therapy.  Client Statement of Needs  Improve coping skills.  Symptoms  Depressed or irritable mood. Diminished interest in or enjoyment of activities. Lack of energy. Poor concentration and indecisiveness. Social withdrawal.  Problems Addressed  Unipolar Depression Goals 1. Alleviate depressive symptoms and return to previous level of effective functioning. 2. Appropriately grieve the loss in order to normalize mood and to return to previously adaptive level of functioning. Objective Learn and implement behavioral strategies to overcome depression. Target Date: 2023-12-02 Frequency: monthly  Progress: 55 Modality: individual  Related Interventions Engage the client in behavioral activation, increasing his/her activity level and contact with sources of reward, while identifying processes that inhibit activation.  Use behavioral techniques such as instruction, rehearsal, role-playing, role reversal, as needed, to facilitate activity in the client's daily life; reinforce success. Assist the client in developing skills that increase the likelihood of deriving pleasure from behavioral activation (e.g., assertiveness skills, developing an exercise plan, less internal/more  external focus, increased social involvement); reinforce success. Objective Identify important people in life, past and present, and describe the quality, good and poor, of those relationships. Target Date: 2023-12-02 Frequency:  monthly  Progress: 55 Modality: individual  Related Interventions Conduct Interpersonal Therapy beginning with the assessment of the client's interpersonal inventory of important past and present relationships; develop a case formulation linking depression to grief, interpersonal role disputes, role transitions, and/or interpersonal deficits). Objective Learn and implement problem-solving and decision-making skills. Target Date: 2023-12-02 Frequency: monthly  Progress: 55 Modality: individual  Related Interventions Conduct Problem-Solving Therapy using techniques such as psychoeducation, modeling, and role-playing to teach client problem-solving skills (i.e., defining a problem specifically, generating possible solutions, evaluating the pros and cons of each solution, selecting and implementing a plan of action, evaluating the efficacy of the plan, accepting or revising the plan); role-play application of the problem-solving skill to a real life issue. Encourage in the client the development of a positive problem orientation in which problems and solving them are viewed as a natural part of life and not something to be feared, despaired, or avoided. 3. Develop healthy interpersonal relationships that lead to the alleviation and help prevent the relapse of depression. 4. Develop healthy thinking patterns and beliefs about self, others, and the world that lead to the alleviation and help prevent the relapse of depression. 5. Recognize, accept, and cope with feelings of depression. Diagnosis F33.1  Conditions For Discharge Achievement of treatment goals and objectives   [name], LCSW

## 2023-08-05 ENCOUNTER — Ambulatory Visit: Admitting: Psychology

## 2023-08-13 ENCOUNTER — Ambulatory Visit: Admitting: Psychology

## 2023-08-13 DIAGNOSIS — F331 Major depressive disorder, recurrent, moderate: Secondary | ICD-10-CM

## 2023-08-13 NOTE — Progress Notes (Signed)
 [facility] Counselor/Therapist Progress Note  Patient ID: Beverly Cole, MRN: [id number],    Date: 08/13/2023  Time Spent: 3:00pm-3:55pm   55 minutes   Treatment Type: Individual Therapy  Reported Symptoms: stress, loneliness  Mental Status Exam: Appearance:  Casual     Behavior: Appropriate  Motor: Normal  Speech/Language:  Normal Rate  Affect: Appropriate  Mood: normal  Thought process: normal  Thought content:   WNL  Sensory/Perceptual disturbances:   WNL  Orientation: oriented to person, place, time/date, and situation  Attention: Good  Concentration: Good  Memory: WNL  Fund of knowledge:  Good  Insight:   Good  Judgment:  Good  Impulse Control: Good   Risk Assessment: Danger to Self:  No Self-injurious Behavior: No Danger to Others: No Duty to Warn:no Physical Aggression / Violence:No  Access to Firearms a concern: No  Gang Involvement:No   Subjective: Pt present for face-to-face individual therapy via video.  Pt consents to telehealth video session and is aware of limitations and benefits of virtual sessions.  Location of pt: home Location of therapist: home office.  Pt talked about getting unblocked by the guy she is interested in online.  Pt states she is happy about it.  Addressed the concerns about engaging with people online.  Pt is living in a fantasy in her head about the potential of those relationships.   Worked with pt on differentiating what is real and what is fantasy.  Pt is realizing that she is lonely and more vulnerable to people online.  Addressed how pt can deal with her loneliness in healthy ways.  Pt had a talk with her [name] about being unhappy.   [name] thinks this may be bc she has decreased her antidepressant medication.  Pt is going to consult with her psychiatrist about her medication.    Pt is unhappy about being stuck in her house.  Worked with pt on how she can start going places in small steps.  She feels like she  needs to go with [name] to places that are close to home.   Worked on self care strategies. Provided supportive therapy.    Interventions: Cognitive Behavioral Therapy and Insight-Oriented  Diagnosis:F33.1  Plan of Care: Recommend ongoing therapy.   Pt participated in setting treatment goals.  Plan to meet monthly.  Pt agrees with treatment plan.  Treatment Plan Client Abilities/Strengths  Pt is bright, engaging, and motivated for therapy.   Client Treatment Preferences  Individual therapy.  Client Statement of Needs  Improve coping skills.  Symptoms  Depressed or irritable mood. Diminished interest in or enjoyment of activities. Lack of energy. Poor concentration and indecisiveness. Social withdrawal.  Problems Addressed  Unipolar Depression Goals 1. Alleviate depressive symptoms and return to previous level of effective functioning. 2. Appropriately grieve the loss in order to normalize mood and to return to previously adaptive level of functioning. Objective Learn and implement behavioral strategies to overcome depression. Target Date: 2023-12-02 Frequency: monthly  Progress: 55 Modality: individual  Related Interventions Engage the client in behavioral activation, increasing his/her activity level and contact with sources of reward, while identifying processes that inhibit activation.  Use behavioral techniques such as instruction, rehearsal, role-playing, role reversal, as needed, to facilitate activity in the client's daily life; reinforce success. Assist the client in developing skills that increase the likelihood of deriving pleasure from behavioral activation (e.g., assertiveness skills, developing an exercise plan, less internal/more external focus, increased social involvement); reinforce success. Objective Identify important people  in life, past and present, and describe the quality, good and poor, of those relationships. Target Date: 2023-12-02 Frequency: monthly   Progress: 55 Modality: individual  Related Interventions Conduct Interpersonal Therapy beginning with the assessment of the client's interpersonal inventory of important past and present relationships; develop a case formulation linking depression to grief, interpersonal role disputes, role transitions, and/or interpersonal deficits). Objective Learn and implement problem-solving and decision-making skills. Target Date: 2023-12-02 Frequency: monthly  Progress: 55 Modality: individual  Related Interventions Conduct Problem-Solving Therapy using techniques such as psychoeducation, modeling, and role-playing to teach client problem-solving skills (i.e., defining a problem specifically, generating possible solutions, evaluating the pros and cons of each solution, selecting and implementing a plan of action, evaluating the efficacy of the plan, accepting or revising the plan); role-play application of the problem-solving skill to a real life issue. Encourage in the client the development of a positive problem orientation in which problems and solving them are viewed as a natural part of life and not something to be feared, despaired, or avoided. 3. Develop healthy interpersonal relationships that lead to the alleviation and help prevent the relapse of depression. 4. Develop healthy thinking patterns and beliefs about self, others, and the world that lead to the alleviation and help prevent the relapse of depression. 5. Recognize, accept, and cope with feelings of depression. Diagnosis F33.1  Conditions For Discharge Achievement of treatment goals and objectives   Beverly Alma, LCSW

## 2023-08-17 ENCOUNTER — Encounter: Payer: Self-pay | Admitting: Family

## 2023-08-17 ENCOUNTER — Encounter: Payer: Self-pay | Admitting: Family Medicine

## 2023-08-17 ENCOUNTER — Ambulatory Visit: Admitting: Family Medicine

## 2023-08-17 ENCOUNTER — Telehealth: Payer: Self-pay

## 2023-08-17 ENCOUNTER — Other Ambulatory Visit (HOSPITAL_COMMUNITY): Payer: Self-pay

## 2023-08-17 VITALS — BP 98/62 | HR 79 | Temp 98.1°F | Resp 18 | Ht 69.0 in | Wt 162.6 lb

## 2023-08-17 DIAGNOSIS — K219 Gastro-esophageal reflux disease without esophagitis: Secondary | ICD-10-CM | POA: Diagnosis not present

## 2023-08-17 DIAGNOSIS — B359 Dermatophytosis, unspecified: Secondary | ICD-10-CM | POA: Diagnosis not present

## 2023-08-17 MED ORDER — NYSTATIN 100000 UNIT/GM EX POWD
1.0000 | Freq: Three times a day (TID) | CUTANEOUS | 0 refills | Status: AC
  Filled 2023-08-17: qty 15, 5d supply, fill #0

## 2023-08-17 MED ORDER — DEXLANSOPRAZOLE 60 MG PO CPDR
60.0000 mg | DELAYED_RELEASE_CAPSULE | Freq: Every day | ORAL | 3 refills | Status: DC
  Filled 2023-08-17 – 2023-09-08 (×2): qty 90, 90d supply, fill #0

## 2023-08-17 NOTE — Telephone Encounter (Signed)
           Pharmacy Patient Advocate Encounter   Received notification from CoverMyMeds that prior authorization for Dexlansoprazole  60MG  dr capsules  is required/requested.  Insurance verification completed.   The patient is insured through [facility] . Per test claim:  ALTERNATIVES: OTCS LANSOPRAZOLE OR ESOMEPRAZOLE  CAPS (OTC PREVACID OR NEXIUM ), OR OMEPRAZOLE DR CAP is preferred by the insurance.    PLEASE BE ADVISED Action: Medication is not eligible for pharmacy benefits and must be billed through medical insurance.   OUTCOME FROM MEDIMPACT  This drug/product is not covered under the pharmacy benefit. Prior Authorization is not available.

## 2023-08-17 NOTE — Progress Notes (Signed)
 Established Patient Office Visit  Subjective   Patient ID: Beverly Cole, female    DOB: 02-26-1960  Age: 63 y.o. MRN: [id number]  Chief Complaint  Patient presents with   Weight Loss    HPI Discussed the use of AI scribe software for clinical note transcription with the patient, who gave verbal consent to proceed.  History of Present Illness Beverly Cole is a 63 year old female who presents with unintentional weight loss and nausea.  She has experienced unintentional weight loss of 25 pounds following a previous weight gain during the COVID-19 pandemic. The weight loss began after her medication was changed from Dexilant  to Aciphex  due to insurance coverage issues. She continues to lose weight without trying.  She experiences significant nausea, particularly when eating, to the point of feeling like she might vomit. She was prescribed Zofran  for nausea, but it has not been effective. She is unsure of the dose but believes it might be 4 mg, taken every eight hours. No abdominal pain, only nausea.  She has a history of stomach problems since her twenties and has been on medications like Dexilant  for a long time, previously on a 60 mg dose. She also reports feeling tired and weak, with a recurrence of vertigo that has since improved. Additionally, she notes significant hair loss and a persistent rash, which she associates with iron  deficiency, although her recent blood levels have improved to 14.  Her allergies are acting up, causing red eyes, but she does not use eye drops, preferring nasal treatments instead. No new medications or changes in dosage of current medications.   Patient Active Problem List   Diagnosis Date Noted   IDA (iron  deficiency anemia) 06/08/2023   Iron  deficiency anemia 04/16/2023   Acute left-sided low back pain without sciatica 04/29/2022   Polyp of colon 04/29/2022   Abnormal weight gain 07/17/2021   Preventative health care 07/17/2021   Diverticular  disease of colon 07/17/2021   Dysphagia 07/17/2021   Family history of malignant neoplasm of digestive organs 07/17/2021   Second degree hemorrhoids 07/17/2021   Change in bowel habit 07/17/2021   Protein S deficiency (HCC) 12/10/2020   Thyroid  nodule 12/10/2020   Other fatigue 12/10/2020   COVID-19 virus infection 07/16/2020   Chronic left shoulder pain 03/13/2020   RUQ pain 07/04/2019   Rosacea, acne 06/29/2019   Hyperlipidemia LDL goal <100 05/26/2017   Acute perforated appendicitis s/p lap appendectomy 03/17/2017 03/16/2017   History of pulmonary embolus - Dec 2017 03/16/2017   Lupus anticoagulant positive 03/16/2017   Irritable bowel syndrome (IBS) 03/16/2017   Anxiety and depression 03/16/2017   SOB (shortness of breath) 04/25/2016   Laryngopharyngeal reflux (LPR) 02/13/2016   Throat discomfort 02/13/2016   Chronic saddle pulmonary embolism with acute cor pulmonale (HCC)    Chest pain 02/09/2015   Severe recurrent major depression without psychotic features (HCC)    Goiter, nontoxic, multinodular 12/18/2014   Tremor 05/09/2012   SVT (supraventricular tachycardia) (HCC) 12/03/2011   Hypertension 08/26/2010   Memory loss 04/08/2010   Hyperlipidemia 03/13/2009   Bipolar 2 disorder (HCC) 03/13/2009   Hypothyroidism 07/06/2008   GERD 11/05/2006   ACNE NEC 11/05/2006   DEPRESSION 07/23/2006   Past Medical History:  Diagnosis Date   Anxiety    Atrial fibrillation (HCC)    a. Event monitor 2013 - PACs/bradycardia/SVT/short run of atrial fib by event monitor.    BIPOLAR DISORDER UNSPECIFIED    Bradycardia    Bursitis of hip  09/1999   Bilateral - Dr. Yvone   DEPRESSION    Emphysema lung (HCC) 06/20/2016   pt states pulmonalongist stated started recently   GERD    Headache(784.0)    was with menstrual cycle. no longer a problem   HYPERLIPIDEMIA    Hypertension    Hypertension    IRRITABLE BOWEL SYNDROME, HX OF    Menopausal state 01/2012   [facility] = 88.5   Mitral valve  prolapse 12/17/2000   a. dx 1980s, most recent echo did not demonstrate this.   MYALGIA    Normal coronary arteries    a. by cardiac CT 2013.   PAT (paroxysmal atrial tachycardia) (HCC)    PE (pulmonary embolism) 02/09/2015   a. Bilateral PEs 01/2015 when d-dimer 0.69, CP lying on left side. Followed by heme-onc - + lupus anticoagulant, mildly depressed protein S. Dr. Timmy is not certain if her hypercoagulable studies are significant for a thrombophilic state.   Premature atrial contractions    Pulmonary embolus (HCC) 02/10/2015   Shortness of breath    Pulmonary eval 05/2002   Thyroid  nodule 2014   Tremors of nervous system    Past Surgical History:  Procedure Laterality Date   COLONOSCOPY     LAPAROSCOPIC APPENDECTOMY N/A 03/17/2017   Procedure: APPENDECTOMY LAPAROSCOPIC;  Surgeon: Mikell Katz, MD;  Location: [facility];  Service: General;  Laterality: N/A;   Lipoma (R) side  1990   Right back   Social History   Tobacco Use   Smoking status: Former    Current packs/day: 0.00    Average packs/day: 1 pack/day for 25.0 years (25.0 ttl pk-yrs)    Types: Cigarettes    Start date: 02/11/1971    Quit date: 02/11/1996    Years since quitting: 27.5   Smokeless tobacco: Never   Tobacco comments:    Married, lives with spouse. 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  Vaping Use   Vaping status: Never Used  Substance Use Topics   Alcohol use: Yes    Comment: occ   Drug use: No   Social History   Socioeconomic History   Marital status: Married    Spouse name: Not on file   Number of children: 0   Years of education: Not on file   Highest education level: Associate degree: academic program  Occupational History   Occupation: Nurse-Personal Care/[employer]    Employer: UNEMPLOYED  Tobacco Use   Smoking status: Former    Current packs/day: 0.00    Average packs/day: 1 pack/day for 25.0 years (25.0 ttl pk-yrs)    Types: Cigarettes    Start date: 02/11/1971    Quit date: 02/11/1996     Years since quitting: 27.5   Smokeless tobacco: Never   Tobacco comments:    Married, lives with spouse. 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       No exercise   Social Drivers of Health   Financial Resource Strain: Low Risk  (08/11/2023)   Overall Financial Resource Strain (CARDIA)    Difficulty of Paying Living Expenses: Not hard at all  Food  Insecurity: No Food Insecurity (08/11/2023)   Hunger Vital Sign    Worried About Running Out of Food in the Last Year: Never true    Ran Out of Food in the Last Year: Never true  Transportation Needs: No Transportation Needs (08/11/2023)   PRAPARE - Administrator, Civil Service (Medical): No    Lack of Transportation (Non-Medical): No  Physical Activity: Inactive (08/11/2023)   Exercise Vital Sign    Days of Exercise per Week: 0 days    Minutes of Exercise per Session: Not on file  Stress: Stress Concern Present (08/11/2023)   [employer] of Occupational Health - Occupational Stress Questionnaire    Feeling of Stress: Very much  Social Connections: Socially Isolated (08/11/2023)   Social Connection and Isolation Panel    Frequency of Communication with Friends and Family: Once a week    Frequency of Social Gatherings with Friends and Family: Never    Attends Religious Services: Never    Database administrator or Organizations: No    Attends Engineer, structural: Not on file    Marital Status: Married  Catering manager Violence: Not on file   Family Status  Relation Name Status   Mother  Deceased       lung CA   Father  Deceased       esophageal CA   Brother  Alive       testicular CA    Nutritional therapist  (Not Specified)   Other  (Not Specified)  No partnership data on file   Family History  Problem Relation Age of Onset   Lung cancer Mother    Hypertension Mother    Hyperlipidemia Mother    Cancer Father        Esophageal cancer   Hyperlipidemia Father    Depression Father    Cancer Brother    Pulmonary embolism Brother    Colon cancer Paternal Uncle    Sarcoidosis Other    Testicular cancer Other    Allergies  Allergen Reactions   Cariprazine Other (See Comments)    Patient becomes suicidal when taking this medication.  Patient becomes suicidal when taking this medication.    Metoprolol  Other (See Comments)    Hallucinations hair falls out Hallucinations hair falls out   Nickel Rash   Iron  Nausea And Vomiting      Review of Systems  Constitutional:  Negative for chills, fever and malaise/fatigue.  HENT:  Negative for congestion and hearing loss.   Eyes:  Negative for blurred vision and discharge.  Respiratory:  Negative for cough, sputum production and shortness of breath.   Cardiovascular:  Negative for chest pain, palpitations and leg swelling.  Gastrointestinal:  Negative for abdominal pain, blood in stool, constipation, diarrhea, heartburn, nausea and vomiting.  Genitourinary:  Negative for dysuria, frequency, hematuria and urgency.  Musculoskeletal:  Negative for back pain, falls and myalgias.  Skin:  Negative for rash.  Neurological:  Negative for dizziness, sensory change, loss of consciousness, weakness and headaches.  Endo/Heme/Allergies:  Negative for environmental allergies. Does not bruise/bleed easily.  Psychiatric/Behavioral:  Negative for depression and suicidal ideas. The patient is not nervous/anxious and does not have insomnia.       Objective:     BP 98/62 (BP Location: Left Arm, Patient Position: Sitting, Cuff Size: Normal)   Pulse 79   Temp 98.1 F (36.7 C) (Oral)   Resp 18   Ht 5' 9 (1.753 m)   Wt 162  lb 9.6 oz (73.8 kg)    LMP 05/11/2012   SpO2 96%   BMI 24.01 kg/m  BP Readings from Last 3 Encounters:  08/17/23 98/62  07/17/23 (!) 105/56  06/26/23 (!) 103/56   Wt Readings from Last 3 Encounters:  08/17/23 162 lb 9.6 oz (73.8 kg)  07/17/23 164 lb (74.4 kg)  05/18/23 172 lb (78 kg)   SpO2 Readings from Last 3 Encounters:  08/17/23 96%  07/17/23 99%  06/26/23 96%      Physical Exam Vitals and nursing note reviewed.  Constitutional:      General: She is not in acute distress.    Appearance: Normal appearance. She is well-developed.  HENT:     Head: Normocephalic and atraumatic.     Right Ear: Tympanic membrane, ear canal and external ear normal. There is no impacted cerumen.     Left Ear: Tympanic membrane, ear canal and external ear normal. There is no impacted cerumen.     Nose: Nose normal.     Mouth/Throat:     Mouth: Mucous membranes are moist.     Pharynx: Oropharynx is clear. No oropharyngeal exudate or posterior oropharyngeal erythema.  Eyes:     General: No scleral icterus.       Right eye: No discharge.        Left eye: No discharge.     Conjunctiva/sclera: Conjunctivae normal.     Pupils: Pupils are equal, round, and reactive to light.  Neck:     Thyroid : No thyromegaly or thyroid  tenderness.     Vascular: No JVD.  Cardiovascular:     Rate and Rhythm: Normal rate and regular rhythm.     Heart sounds: Normal heart sounds. No murmur heard. Pulmonary:     Effort: Pulmonary effort is normal. No respiratory distress.     Breath sounds: Normal breath sounds.  Abdominal:     General: Bowel sounds are normal. There is no distension.     Palpations: Abdomen is soft. There is no mass.     Tenderness: There is no abdominal tenderness. There is no guarding or rebound.  Genitourinary:    Vagina: Normal.  Musculoskeletal:        General: Normal range of motion.     Cervical back: Normal range of motion and neck supple.     Right lower leg: No edema.     Left lower leg: No edema.   Lymphadenopathy:     Cervical: No cervical adenopathy.  Skin:    General: Skin is warm and dry.     Findings: No erythema or rash.  Neurological:     Mental Status: She is alert and oriented to person, place, and time.     Cranial Nerves: No cranial nerve deficit.     Deep Tendon Reflexes: Reflexes are normal and symmetric.  Psychiatric:        Mood and Affect: Mood normal.        Behavior: Behavior normal.        Thought Content: Thought content normal.        Judgment: Judgment normal.      No results found for any visits on 08/17/23.  Last CBC Lab Results  Component Value Date   WBC 7.8 07/17/2023   HGB 14.7 07/17/2023   HCT 46.6 (H) 07/17/2023   MCV 81.8 07/17/2023   MCH 25.8 (L) 07/17/2023   RDW 19.9 (H) 07/17/2023   PLT 274 07/17/2023   Last metabolic panel Lab Results  Component  Value Date   GLUCOSE 100 (H) 04/30/2023   NA 141 04/30/2023   K 4.3 04/30/2023   CL 104 04/30/2023   CO2 26 04/30/2023   BUN 15 04/30/2023   CREATININE 0.87 04/30/2023   GFR 70.90 04/30/2023   CALCIUM  9.9 04/30/2023   PROT 7.3 04/30/2023   ALBUMIN 4.5 04/30/2023   LABGLOB 2.6 04/05/2020   AGRATIO 1.8 04/05/2020   BILITOT 0.3 04/30/2023   ALKPHOS 91 04/30/2023   AST 21 04/30/2023   ALT 20 04/30/2023   ANIONGAP 12 04/13/2023   Last lipids Lab Results  Component Value Date   CHOL 240 (H) 04/30/2023   HDL 46.80 04/30/2023   LDLCALC 147 (H) 04/30/2023   LDLDIRECT 200.0 04/29/2022   TRIG 231.0 (H) 04/30/2023   CHOLHDL 5 04/30/2023   Last hemoglobin A1c Lab Results  Component Value Date   HGBA1C 5.3 03/20/2009   Last thyroid  functions Lab Results  Component Value Date   TSH 2.23 04/30/2023   T4TOTAL 7.5 12/04/2020   THYROIDAB 9 12/15/2014   Last vitamin D  Lab Results  Component Value Date   VD25OH 28.95 (L) 03/23/2023   Last vitamin B12 and Folate Lab Results  Component Value Date   VITAMINB12 407 03/23/2023      The 10-year ASCVD risk score (Arnett DK,  et al., 2019) is: 4.4%    Assessment & Plan:   Problem List Items Addressed This Visit       Unprioritized   GERD   Relevant Medications   dexlansoprazole  (DEXILANT ) 60 MG capsule   Other Visit Diagnoses       Tinea    -  Primary   Relevant Medications   nystatin  powder     Assessment and Plan Assessment & Plan Unintentional weight loss and nausea   The recent switch from Dexilant  to Aciphex  due to insurance has led to unintentional weight loss and severe nausea, causing emesis when eating. Zofran  has not alleviated the nausea. The medication change is likely contributing to these symptoms. Attempt to get Dexilant  approved. If symptoms persist, consider a weight loss workup, including CT scans.  Rash   She has a slightly itchy rash, which she believes is linked to iron  deficiency, though her blood levels are normal. Provide cream or powder for the rash.  Allergic conjunctivitis   Severe allergies today have resulted in red eyes. She uses nasal sprays but not eye drops.  Follow-up   She has a dermatology appointment at [facility] on Wednesday. Follow up on Dexilant 's effectiveness and consider further workup if symptoms persist.    No follow-ups on file.    Roper Tolson R Lowne Chase, DO

## 2023-08-17 NOTE — Patient Instructions (Signed)
 GERD in Adults: Diet Changes When you have gastroesophageal reflux disease (GERD), you may need to make changes to your diet. Choosing the right foods can help with your symptoms. Think about working with an expert in healthy eating called a dietitian. They can help you make healthy food choices. What are tips for following this plan? Reading food labels Look for foods that are low in saturated fat. Foods that may help with your symptoms include: Foods with less than 5% of daily value (DV) of fat. Foods with 0 grams of trans fat. Cooking [employer] in ways that don't use a lot of fat. These ways include: Baking. Steaming. Grilling. Broiling. To add flavor, try to use herbs that are low in spice and acidity. Avoid frying your food. Meal planning  Eat small meals often rather than eating 3 large meals each day. Eat your meals slowly in a place where you feel relaxed. If told by your health care provider, avoid: Foods that cause symptoms. Keep a food diary to keep track of foods that cause symptoms. Alcohol. Drinking a lot of liquid with meals. General instructions For 2-3 hours after you eat, avoid: Bending over. Exercise. Lying down. Chew sugar-free gum after meals. What foods should I eat? Eat a healthy diet. Try to include: Foods with high amounts of fiber. These include: Fruits and vegetables. Whole grains and beans. Low-fat dairy products. Lean meats, fish, and poultry. Egg whites. Foods that cause symptoms in someone else may not cause symptoms for you. Work with your provider to find foods that are safe for you. The items listed above may not be all the foods and drinks you can have. Talk with a dietitian to learn more. The items listed above may not be a complete list of foods and beverages you can eat and drink. Contact a dietitian for more information. What foods should I avoid? Limiting some of these foods may help with your symptoms. Each person is different.  Talk with a dietitian or your provider to help you find the exact foods to avoid. Some of the foods to avoid may include: Fruits Fruits with a lot of acid in them. These may include citrus fruits, such as oranges, grapefruit, pineapple, and lemons. Vegetables Deep-fried vegetables, such as [place] fries. Vegetables, sauces, or toppings made with added fat and vegetables with acid in them. These may include tomatoes and tomato products, chili peppers, onions, garlic, and horseradish. Grains Pastries or quick breads with added fat. Meats and other proteins High-fat meats, such as fatty beef or pork, hot dogs, ribs, ham, sausage, salami, and bacon. Fried meat or protein, such as fried fish and fried chicken. Egg yolks. Fats and oils Butter. Margarine. Shortening. Ghee. Drinks Coffee and other drinks with caffeine in them. Fizzy and sugary drinks, such as soda and energy drinks. Fruit juice made with acidic fruits, such as orange or grapefruit. Tomato juice. Sweets and desserts Chocolate and cocoa. Donuts. Seasonings and condiments Mint, such as peppermint and spearmint. Condiments, herbs, or seasonings that cause symptoms. These may include curry, hot sauce, or vinegar-based salad dressings. The items listed above may not be all the foods and drinks you should avoid. Talk with a dietitian to learn more. Questions to ask your health care provider Changes to your diet and everyday life are often the first steps taken to manage symptoms of GERD. If these changes don't help, talk with your provider about taking medicines. Where to find more information International Foundation for Gastrointestinal Disorders:  aboutgerd.org This information is not intended to replace advice given to you by your health care provider. Make sure you discuss any questions you have with your health care provider. Document Revised: 12/09/2022 Document Reviewed: 06/25/2022 Elsevier Patient Education  2024 [employer].

## 2023-08-19 ENCOUNTER — Encounter: Payer: Self-pay | Admitting: Dermatology

## 2023-08-19 ENCOUNTER — Ambulatory Visit: Payer: 59 | Admitting: Dermatology

## 2023-08-19 VITALS — BP 90/78 | HR 85

## 2023-08-19 DIAGNOSIS — L738 Other specified follicular disorders: Secondary | ICD-10-CM

## 2023-08-19 DIAGNOSIS — L578 Other skin changes due to chronic exposure to nonionizing radiation: Secondary | ICD-10-CM

## 2023-08-19 DIAGNOSIS — D492 Neoplasm of unspecified behavior of bone, soft tissue, and skin: Secondary | ICD-10-CM | POA: Diagnosis not present

## 2023-08-19 DIAGNOSIS — Z1283 Encounter for screening for malignant neoplasm of skin: Secondary | ICD-10-CM | POA: Diagnosis not present

## 2023-08-19 DIAGNOSIS — D225 Melanocytic nevi of trunk: Secondary | ICD-10-CM

## 2023-08-19 DIAGNOSIS — D229 Melanocytic nevi, unspecified: Secondary | ICD-10-CM

## 2023-08-19 DIAGNOSIS — D1801 Hemangioma of skin and subcutaneous tissue: Secondary | ICD-10-CM

## 2023-08-19 DIAGNOSIS — L814 Other melanin hyperpigmentation: Secondary | ICD-10-CM | POA: Diagnosis not present

## 2023-08-19 DIAGNOSIS — B353 Tinea pedis: Secondary | ICD-10-CM

## 2023-08-19 DIAGNOSIS — L821 Other seborrheic keratosis: Secondary | ICD-10-CM

## 2023-08-19 DIAGNOSIS — L739 Follicular disorder, unspecified: Secondary | ICD-10-CM | POA: Diagnosis not present

## 2023-08-19 DIAGNOSIS — W908XXA Exposure to other nonionizing radiation, initial encounter: Secondary | ICD-10-CM

## 2023-08-19 DIAGNOSIS — D0462 Carcinoma in situ of skin of left upper limb, including shoulder: Secondary | ICD-10-CM

## 2023-08-19 DIAGNOSIS — D485 Neoplasm of uncertain behavior of skin: Secondary | ICD-10-CM

## 2023-08-19 MED ORDER — JUBLIA 10 % EX SOLN: 1.0000 | Freq: Every day | CUTANEOUS | 5 refills | Status: AC

## 2023-08-19 NOTE — Progress Notes (Signed)
 New Patient Visit   Subjective  Beverly Cole is a 63 y.o. female who presents for the following: Skin Cancer Screening and Full Body Skin Exam.  No hx of skin cancer. No family hx of skin cancer. A lot of sun exposure over her lifetime. Patient doesn't really use sunscreen at this time.   The patient presents for Total-Body Skin Exam (TBSE) for skin cancer screening and mole check. The patient has spots, moles and lesions to be evaluated, some may be new or changing and the patient may have concern these could be cancer.    The following portions of the chart were reviewed this encounter and updated as appropriate: medications, allergies, medical history  Review of Systems:  No other skin or systemic complaints except as noted in HPI or Assessment and Plan.  Objective  Well appearing patient in no apparent distress; mood and affect are within normal limits.  A full examination was performed including scalp, head, eyes, ears, nose, lips, neck, chest, axillae, abdomen, back, buttocks, bilateral upper extremities, bilateral lower extremities, hands, feet, fingers, toes, fingernails, and toenails. All findings within normal limits unless otherwise noted below.   Relevant physical exam findings are noted in the Assessment and Plan.  Left Forearm - Anterior Pink papule cresting into plaque   Right medial canthus Pink papule   Right Temple Pink papule Right Lower Back Irregular dark brown papule   Assessment & Plan   SKIN CANCER SCREENING PERFORMED TODAY.  ACTINIC DAMAGE - Chronic condition, secondary to cumulative UV/sun exposure - diffuse scaly erythematous macules with underlying dyspigmentation - Recommend daily broad spectrum sunscreen SPF 30+ to sun-exposed areas, reapply every 2 hours as needed.  - Staying in the shade or wearing long sleeves, sun glasses (UVA+UVB protection) and wide brim hats (4-inch brim around the entire circumference of the hat) are also  recommended for sun protection.  - Call for new or changing lesions.  LENTIGINES, SEBORRHEIC KERATOSES, HEMANGIOMAS - Benign normal skin lesions - Benign-appearing - Call for any changes  MELANOCYTIC NEVI - Tan-brown and/or pink-flesh-colored symmetric macules and papules - Benign appearing on exam today - Observation - Call clinic for new or changing moles - Recommend daily use of broad spectrum spf 30+ sunscreen to sun-exposed areas.   TINEA PEDIS Exam: Scaling and maceration web spaces and over distal and lateral soles.  Treatment Plan: -Start jublia      Sebaceous Hyperplasia - Small yellow papules with a central dell - Benign-appearing - Observe. Call for changes.   NEOPLASM OF UNCERTAIN BEHAVIOR OF SKIN (4) Left Forearm - Anterior Epidermal / dermal shaving  Lesion diameter (cm):  0.8 Informed consent: discussed and consent obtained   Timeout: patient name, date of birth, surgical site, and procedure verified   Procedure prep:  Patient was prepped and draped in usual sterile fashion Prep type:  Isopropyl alcohol Anesthesia: the lesion was anesthetized in a standard fashion   Anesthetic:  1% lidocaine  w/ epinephrine  1-100,000 buffered w/ 8.4% NaHCO3 Instrument used: DermaBlade   Hemostasis achieved with: aluminum chloride   Outcome: patient tolerated procedure well   Post-procedure details: sterile dressing applied and wound care instructions given   Dressing type: petrolatum   Additional details:  Pt aware that benign results will be sent to mychart and the staff will call abnormal results will  Specimen 1 - Surgical pathology Differential Diagnosis: r/o bcc vs isk vs GA  Check Margins: No Right medial canthus Epidermal / dermal shaving  Lesion diameter (cm):  0.5 Informed consent: discussed and consent obtained   Timeout: patient name, date of birth, surgical site, and procedure verified   Procedure prep:  Patient was prepped and draped in usual sterile  fashion Prep type:  Isopropyl alcohol Anesthesia: the lesion was anesthetized in a standard fashion   Anesthetic:  1% lidocaine  w/ epinephrine  1-100,000 buffered w/ 8.4% NaHCO3 Instrument used: DermaBlade   Hemostasis achieved with: aluminum chloride   Outcome: patient tolerated procedure well   Post-procedure details: sterile dressing applied and wound care instructions given   Dressing type: petrolatum   Additional details:  Pt aware that benign results will be sent to mychart and the staff will call abnormal results will  Specimen 2 - Surgical pathology Differential Diagnosis: r/o bcc  Check Margins: No Right Temple Epidermal / dermal shaving  Lesion diameter (cm):  0.5 Informed consent: discussed and consent obtained   Timeout: patient name, date of birth, surgical site, and procedure verified   Procedure prep:  Patient was prepped and draped in usual sterile fashion Prep type:  Isopropyl alcohol Anesthesia: the lesion was anesthetized in a standard fashion   Anesthetic:  1% lidocaine  w/ epinephrine  1-100,000 buffered w/ 8.4% NaHCO3 Instrument used: DermaBlade   Hemostasis achieved with: aluminum chloride   Outcome: patient tolerated procedure well   Post-procedure details: sterile dressing applied and wound care instructions given   Dressing type: petrolatum   Additional details:  Pt aware that benign results will be sent to mychart and the staff will call abnormal results will  Specimen 3 - Surgical pathology Differential Diagnosis: r/o bcc  Check Margins: No Right Lower Back Epidermal / dermal shaving  Lesion diameter (cm):  0.7 Informed consent: discussed and consent obtained   Timeout: patient name, date of birth, surgical site, and procedure verified   Procedure prep:  Patient was prepped and draped in usual sterile fashion Prep type:  Isopropyl alcohol Anesthesia: the lesion was anesthetized in a standard fashion   Anesthetic:  1% lidocaine  w/ epinephrine   1-100,000 buffered w/ 8.4% NaHCO3 Instrument used: DermaBlade   Hemostasis achieved with: aluminum chloride   Outcome: patient tolerated procedure well   Post-procedure details: sterile dressing applied and wound care instructions given   Dressing type: petrolatum   Additional details:  Pt aware that benign results will be sent to mychart and the staff will call abnormal results will  Specimen 4 - Surgical pathology Differential Diagnosis: r/o DN  Check Margins: No  Return in about 1 year (around 08/18/2024) for TBSC.  IBerwyn Lesches, Surg Tech III, am acting as scribe for [employer], DO.   Documentation: I have reviewed the above documentation for accuracy and completeness, and I agree with the above.  Delon Lenis, DO

## 2023-08-19 NOTE — Patient Instructions (Addendum)
Patient Handout: Wound Care for Skin Biopsy Site  Taking Care of Your Skin Biopsy Site  Proper care of the biopsy site is essential for promoting healing and minimizing scarring. This handout provides instructions on how to care for your biopsy site to ensure optimal recovery.  1. Cleaning the Wound:  Clean the biopsy site daily with gentle soap and water. Gently pat the area dry with a clean, soft towel. Avoid harsh scrubbing or rubbing the area, as this can irritate the skin and delay healing.  2. Applying Aquaphor and Bandage:  After cleaning the wound, apply a thin layer of Aquaphor ointment to the biopsy site. Cover the area with a sterile bandage to protect it from dirt, bacteria, and friction. Change the bandage daily or as needed if it becomes soiled or wet.  3. Continued Care for One Week:  Repeat the cleaning, Aquaphor application, and bandaging process daily for one week following the biopsy procedure. Keeping the wound clean and moist during this initial healing period will help prevent infection and promote optimal healing.  4. Massaging Aquaphor into the Area:  ---After one week, discontinue the use of bandages but continue to apply Aquaphor to the biopsy site. ----Gently massage the Aquaphor into the area using circular motions. ---Massaging the skin helps to promote circulation and prevent the formation of scar tissue.   Additional Tips:  Avoid exposing the biopsy site to direct sunlight during the healing process, as this can cause hyperpigmentation or worsen scarring. If you experience any signs of infection, such as increased redness, swelling, warmth, or drainage from the wound, contact your healthcare provider immediately. Follow any additional instructions provided by your healthcare provider for caring for the biopsy site and managing any discomfort. Conclusion:  Taking proper care of your skin biopsy site is crucial for ensuring optimal healing and  minimizing scarring. By following these instructions for cleaning, applying Aquaphor, and massaging the area, you can promote a smooth and successful recovery. If you have any questions or concerns about caring for your biopsy site, don't hesitate to contact your healthcare provider for guidance.  Skin Education :   I counseled the patient regarding the following: Sun screen (SPF 30 or greater) should be applied during peak UV exposure (between 10am and 2pm) and reapplied after exercise or swimming.  The ABCDEs of melanoma were reviewed with the patient, and the importance of monthly self-examination of moles was emphasized. Should any moles change in shape or color, or itch, bleed or burn, pt will contact our office for evaluation sooner then their interval appointment.  Plan: Sunscreen Recommendations I recommended a broad spectrum sunscreen with a SPF of 30 or higher. I explained that SPF 30 sunscreens block approximately 97 percent of the sun's harmful rays. Sunscreens should be applied at least 15 minutes prior to expected sun exposure and then every 2 hours after that as long as sun exposure continues. If swimming or exercising sunscreen should be reapplied every 45 minutes to an hour after getting wet or sweating. One ounce, or the equivalent of a shot glass full of sunscreen, is adequate to protect the skin not covered by a bathing suit. I also recommended a lip balm with a sunscreen as well. Sun protective clothing can be used in lieu of sunscreen but must be worn the entire time you are exposed to the sun's rays.  Important Information  Due to recent changes in healthcare laws, you may see results of your pathology and/or laboratory studies on MyChart  before the doctors have had a chance to review them. We understand that in some cases there may be results that are confusing or concerning to you. Please understand that not all results are received at the same time and often the doctors may need  to interpret multiple results in order to provide you with the best plan of care or course of treatment. Therefore, we ask that you please give [place] 2 business days to thoroughly review all your results before contacting the office for clarification. Should we see a critical lab result, you will be contacted sooner.   If You Need Anything After Your Visit  If you have any questions or concerns for your doctor, please call our main line at [phone] If no one answers, please leave a voicemail as directed and we will return your call as soon as possible. Messages left after 4 pm will be answered the following business day.   You may also send [place] a message via MyChart. We typically respond to MyChart messages within 1-2 business days.  For prescription refills, please ask your pharmacy to contact our office. Our fax number is [phone].  If you have an urgent issue when the clinic is closed that cannot wait until the next business day, you can page your doctor at the number below.    Please note that while we do our best to be available for urgent issues outside of office hours, we are not available 24/7.   If you have an urgent issue and are unable to reach [place], you may choose to seek medical care at your doctor's office, retail clinic, urgent care center, or emergency room.  If you have a medical emergency, please immediately call 911 or go to the emergency department. In the event of inclement weather, please call our main line at [phone] for an update on the status of any delays or closures.  Dermatology Medication Tips: Please keep the boxes that topical medications come in in order to help keep track of the instructions about where and how to use these. Pharmacies typically print the medication instructions only on the boxes and not directly on the medication tubes.   If your medication is too expensive, please contact our office at [phone] or send [place] a message through MyChart.    We are unable to tell what your co-pay for medications will be in advance as this is different depending on your insurance coverage. However, we may be able to find a substitute medication at lower cost or fill out paperwork to get insurance to cover a needed medication.   If a prior authorization is required to get your medication covered by your insurance company, please allow [place] 1-2 business days to complete this process.  Drug prices often vary depending on where the prescription is filled and some pharmacies may offer cheaper prices.  The website www.goodrx.com contains coupons for medications through different pharmacies. The prices here do not account for what the cost may be with help from insurance (it may be cheaper with your insurance), but the website can give you the price if you did not use any insurance.  - You can print the associated coupon and take it with your prescription to the pharmacy.  - You may also stop by our office during regular business hours and pick up a GoodRx coupon card.  - If you need your prescription sent electronically to a different pharmacy, notify our office through [facility] or by phone at  [phone]     

## 2023-08-20 LAB — SURGICAL PATHOLOGY

## 2023-08-24 ENCOUNTER — Ambulatory Visit: Payer: Self-pay | Admitting: Dermatology

## 2023-08-24 DIAGNOSIS — D099 Carcinoma in situ, unspecified: Secondary | ICD-10-CM | POA: Insufficient documentation

## 2023-08-25 ENCOUNTER — Other Ambulatory Visit: Payer: Self-pay | Admitting: Family

## 2023-08-25 ENCOUNTER — Other Ambulatory Visit: Payer: Self-pay

## 2023-08-25 ENCOUNTER — Encounter: Payer: Self-pay | Admitting: Dermatology

## 2023-08-26 ENCOUNTER — Other Ambulatory Visit (HOSPITAL_COMMUNITY): Payer: Self-pay

## 2023-08-26 ENCOUNTER — Other Ambulatory Visit: Payer: Self-pay

## 2023-08-26 ENCOUNTER — Other Ambulatory Visit: Payer: Self-pay | Admitting: Dermatology

## 2023-08-26 ENCOUNTER — Telehealth: Payer: Self-pay | Admitting: Dermatology

## 2023-08-26 DIAGNOSIS — D485 Neoplasm of uncertain behavior of skin: Secondary | ICD-10-CM

## 2023-08-26 MED ORDER — ONDANSETRON HCL 4 MG PO TABS
4.0000 mg | ORAL_TABLET | Freq: Three times a day (TID) | ORAL | 0 refills | Status: AC | PRN
  Filled 2023-08-26: qty 21, 7d supply, fill #0

## 2023-08-26 MED ORDER — TRETINOIN 0.025 % EX CREA
1.0000 "application " | TOPICAL_CREAM | Freq: Every day | CUTANEOUS | 11 refills | Status: AC
  Filled 2023-08-26: qty 45, 90d supply, fill #0
  Filled 2023-11-20: qty 45, 90d supply, fill #1
  Filled 2024-01-24 – 2024-02-05 (×3): qty 45, 90d supply, fill #2

## 2023-08-26 NOTE — Telephone Encounter (Signed)
 Called pt to sch procedure, no answer, left vm to sch.

## 2023-08-26 NOTE — Telephone Encounter (Signed)
 Beverly Cole, I'm adding you bc Shirron is not here to respond.  It's possibly early infection or it can be inflammation.  TO be cautious please send in an rx for mupirocin  for her to apply BID after cleaning with soap and water.  -Dr. Alm

## 2023-08-27 NOTE — Telephone Encounter (Signed)
 2nd attempt to call pt to sch appt, no answer, left vm to sch.

## 2023-08-28 ENCOUNTER — Other Ambulatory Visit (HOSPITAL_COMMUNITY): Payer: Self-pay

## 2023-08-31 ENCOUNTER — Other Ambulatory Visit (HOSPITAL_COMMUNITY): Payer: Self-pay

## 2023-08-31 MED ORDER — MUPIROCIN 2 % EX OINT
1.0000 | TOPICAL_OINTMENT | Freq: Two times a day (BID) | CUTANEOUS | 0 refills | Status: DC
  Filled 2023-08-31: qty 22, 11d supply, fill #0

## 2023-08-31 NOTE — Telephone Encounter (Signed)
 3rd attempt made to contact pt to sch, left vm for pt to call back to sch.

## 2023-09-03 ENCOUNTER — Ambulatory Visit: Admitting: Psychology

## 2023-09-03 DIAGNOSIS — F331 Major depressive disorder, recurrent, moderate: Secondary | ICD-10-CM | POA: Diagnosis not present

## 2023-09-03 NOTE — Progress Notes (Signed)
 [facility] Counselor/Therapist Progress Note  Patient ID: Beverly Cole, MRN: [id number],    Date: 09/03/2023  Time Spent: 10:00am-10:55am   55 minutes   Treatment Type: Individual Therapy  Reported Symptoms: stress, loneliness  Mental Status Exam: Appearance:  Casual     Behavior: Appropriate  Motor: Normal  Speech/Language:  Normal Rate  Affect: Appropriate  Mood: normal  Thought process: normal  Thought content:   WNL  Sensory/Perceptual disturbances:   WNL  Orientation: oriented to person, place, time/date, and situation  Attention: Good  Concentration: Good  Memory: WNL  Fund of knowledge:  Good  Insight:   Good  Judgment:  Good  Impulse Control: Good   Risk Assessment: Danger to Self:  No Self-injurious Behavior: No Danger to Others: No Duty to Warn:no Physical Aggression / Violence:No  Access to Firearms a concern: No  Gang Involvement:No   Subjective: Pt present for face-to-face individual therapy via video.  Pt consents to telehealth video session and is aware of limitations and benefits of virtual sessions.  Location of pt: home Location of therapist: home office.  Pt talked about her health.  She was diagnosed with skin cancer and she is worried about it.   Pt talked about her connection with the guy online.  Pt has realized that he is a narscisist and she now knows she was obsessed and her attachment to him was not healthy.   Pt has cut off all contact with the guy now.  She talked to her husband about what happened and he was understanding.   Pt is realizing that she is lonely and more vulnerable to people online.  Addressed how pt can deal with her loneliness in healthy ways.  Pt states she feels she wants attention bc [name] works a lot and pt is home alone most of the time.   Pt is still online a lot connecting with guys and is running into a lot of unhealthy people that she has needed to block.   Worked with pt on healthy boundary setting.    Addressed how pt can increase her connection with her [name]. Worked on self care strategies. Provided supportive therapy.    Interventions: Cognitive Behavioral Therapy and Insight-Oriented  Diagnosis:F33.1  Plan of Care: Recommend ongoing therapy.   Pt participated in setting treatment goals.  Plan to meet monthly.  Pt agrees with treatment plan.  Treatment Plan Client Abilities/Strengths  Pt is bright, engaging, and motivated for therapy.   Client Treatment Preferences  Individual therapy.  Client Statement of Needs  Improve coping skills.  Symptoms  Depressed or irritable mood. Diminished interest in or enjoyment of activities. Lack of energy. Poor concentration and indecisiveness. Social withdrawal.  Problems Addressed  Unipolar Depression Goals 1. Alleviate depressive symptoms and return to previous level of effective functioning. 2. Appropriately grieve the loss in order to normalize mood and to return to previously adaptive level of functioning. Objective Learn and implement behavioral strategies to overcome depression. Target Date: 2023-12-02 Frequency: monthly  Progress: 55 Modality: individual  Related Interventions Engage the client in behavioral activation, increasing his/her activity level and contact with sources of reward, while identifying processes that inhibit activation.  Use behavioral techniques such as instruction, rehearsal, role-playing, role reversal, as needed, to facilitate activity in the client's daily life; reinforce success. Assist the client in developing skills that increase the likelihood of deriving pleasure from behavioral activation (e.g., assertiveness skills, developing an exercise plan, less internal/more external focus, increased social involvement);  reinforce success. Objective Identify important people in life, past and present, and describe the quality, good and poor, of those relationships. Target Date: 2023-12-02 Frequency:  monthly  Progress: 55 Modality: individual  Related Interventions Conduct Interpersonal Therapy beginning with the assessment of the client's interpersonal inventory of important past and present relationships; develop a case formulation linking depression to grief, interpersonal role disputes, role transitions, and/or interpersonal deficits). Objective Learn and implement problem-solving and decision-making skills. Target Date: 2023-12-02 Frequency: monthly  Progress: 55 Modality: individual  Related Interventions Conduct Problem-Solving Therapy using techniques such as psychoeducation, modeling, and role-playing to teach client problem-solving skills (i.e., defining a problem specifically, generating possible solutions, evaluating the pros and cons of each solution, selecting and implementing a plan of action, evaluating the efficacy of the plan, accepting or revising the plan); role-play application of the problem-solving skill to a real life issue. Encourage in the client the development of a positive problem orientation in which problems and solving them are viewed as a natural part of life and not something to be feared, despaired, or avoided. 3. Develop healthy interpersonal relationships that lead to the alleviation and help prevent the relapse of depression. 4. Develop healthy thinking patterns and beliefs about self, others, and the world that lead to the alleviation and help prevent the relapse of depression. 5. Recognize, accept, and cope with feelings of depression. Diagnosis F33.1  Conditions For Discharge Achievement of treatment goals and objectives   [name], LCSW

## 2023-09-08 ENCOUNTER — Other Ambulatory Visit: Payer: Self-pay | Admitting: Dermatology

## 2023-09-09 ENCOUNTER — Other Ambulatory Visit: Payer: Self-pay

## 2023-09-09 ENCOUNTER — Encounter: Payer: Self-pay | Admitting: Dermatology

## 2023-09-09 ENCOUNTER — Ambulatory Visit: Admitting: Dermatology

## 2023-09-09 ENCOUNTER — Other Ambulatory Visit (HOSPITAL_COMMUNITY): Payer: Self-pay

## 2023-09-09 ENCOUNTER — Encounter: Payer: Self-pay | Admitting: Family

## 2023-09-09 VITALS — BP 99/69

## 2023-09-09 DIAGNOSIS — D0461 Carcinoma in situ of skin of right upper limb, including shoulder: Secondary | ICD-10-CM | POA: Diagnosis not present

## 2023-09-09 DIAGNOSIS — D099 Carcinoma in situ, unspecified: Secondary | ICD-10-CM

## 2023-09-09 MED ORDER — MUPIROCIN 2 % EX OINT
1.0000 | TOPICAL_OINTMENT | Freq: Two times a day (BID) | CUTANEOUS | 4 refills | Status: AC
  Filled 2023-09-09: qty 22, 11d supply, fill #0
  Filled 2023-09-23 (×2): qty 30, 10d supply, fill #1
  Filled 2023-09-24: qty 22, 11d supply, fill #1
  Filled 2023-10-13: qty 22, 11d supply, fill #2
  Filled 2023-10-13: qty 30, 15d supply, fill #2
  Filled 2023-10-30: qty 22, 11d supply, fill #3
  Filled 2023-11-20: qty 22, 11d supply, fill #4
  Filled 2023-12-11: qty 22, 11d supply, fill #5
  Filled 2023-12-23: qty 30, 15d supply, fill #6

## 2023-09-09 NOTE — Patient Instructions (Addendum)

## 2023-09-09 NOTE — Progress Notes (Signed)
   Follow-Up Visit   Subjective  Beverly HUEBERT is a 63 y.o. female who presents for the following: Biopsy proven SCC in situ of left forearm - EDC today   The following portions of the chart were reviewed this encounter and updated as appropriate: medications, allergies, medical history  Review of Systems:  No other skin or systemic complaints except as noted in HPI or Assessment and Plan.  Objective  Well appearing patient in no apparent distress; mood and affect are within normal limits.   A focused examination was performed of the following areas: Left forearm   Relevant exam findings are noted in the Assessment and Plan.  Right Forearm - Anterior Healing biopsy site   Assessment & Plan     SQUAMOUS CELL CARCINOMA IN SITU Right Forearm - Anterior Destruction of lesion Complexity: extensive   Destruction method: electrodesiccation and curettage   Informed consent: discussed and consent obtained   Timeout:  patient name, date of birth, surgical site, and procedure verified Procedure prep:  Patient was prepped and draped in usual sterile fashion Prep type:  Isopropyl alcohol Anesthesia: the lesion was anesthetized in a standard fashion   Anesthetic:  1% lidocaine  w/ epinephrine  1-100,000 buffered w/ 8.4% NaHCO3 Curettage performed in three different directions: Yes   Electrodesiccation performed over the curetted area: Yes   Lesion length (cm):  1 Lesion width (cm):  1 Margin per side (cm):  0.3 Final wound size (cm):  1.6 Hemostasis achieved with:  pressure and aluminum chloride Outcome: patient tolerated procedure well with no complications   Post-procedure details: sterile dressing applied and wound care instructions given   Dressing type: bandage and petrolatum   Additional details:  Mupirocin  ointment daily while healing   Return for Follow up as scheduled.  I, Roseline Hutchinson, CMA, am acting as scribe for [employer], DO .   Documentation: I have reviewed  the above documentation for accuracy and completeness, and I agree with the above.  Delon Lenis, DO

## 2023-09-10 ENCOUNTER — Other Ambulatory Visit (HOSPITAL_COMMUNITY): Payer: Self-pay

## 2023-09-11 DIAGNOSIS — H43812 Vitreous degeneration, left eye: Secondary | ICD-10-CM | POA: Diagnosis not present

## 2023-09-11 DIAGNOSIS — H5213 Myopia, bilateral: Secondary | ICD-10-CM | POA: Diagnosis not present

## 2023-09-13 ENCOUNTER — Other Ambulatory Visit (HOSPITAL_COMMUNITY): Payer: Self-pay

## 2023-09-17 ENCOUNTER — Ambulatory Visit (INDEPENDENT_AMBULATORY_CARE_PROVIDER_SITE_OTHER): Admitting: Psychology

## 2023-09-17 DIAGNOSIS — F331 Major depressive disorder, recurrent, moderate: Secondary | ICD-10-CM | POA: Diagnosis not present

## 2023-09-17 NOTE — Progress Notes (Signed)
 [facility] Counselor/Therapist Progress Note  Patient ID: Beverly Cole, MRN: [id number],    Date: 09/17/2023  Time Spent: 3:00pm-3:55pm  55 minutes   Treatment Type: Individual Therapy  Reported Symptoms: stress, loneliness  Mental Status Exam: Appearance:  Casual     Behavior: Appropriate  Motor: Normal  Speech/Language:  Normal Rate  Affect: Appropriate  Mood: normal  Thought process: normal  Thought content:   WNL  Sensory/Perceptual disturbances:   WNL  Orientation: oriented to person, place, time/date, and situation  Attention: Good  Concentration: Good  Memory: WNL  Fund of knowledge:  Good  Insight:   Good  Judgment:  Good  Impulse Control: Good   Risk Assessment: Danger to Self:  No Self-injurious Behavior: No Danger to Others: No Duty to Warn:no Physical Aggression / Violence:No  Access to Firearms a concern: No  Gang Involvement:No   Subjective: Pt present for face-to-face individual therapy via video.  Pt consents to telehealth video session and is aware of limitations and benefits of virtual sessions.  Location of pt: home Location of therapist: home office.  Pt talked about her health.  She had the skin cancer removed and they got all the margins.  Pt states she is a cancer survivor now.  Pt talked about having a talk with her [name].   She told [name] everything about her connections with guys online.  [name] was understanding but wants pt to stop contacting the guys.  Pt states she started talking to a guy Johnny online again bc she states she is obsessed with him.   Pt states Garen is a narscisist and she feels like he has control over her.   Pt states she feels like she is in a cult of two.  Addressed the issues and helped pt process her feelings and the relationship dynamics.   Pt is making an agreement with [name] that she will limit her computer time and contact with Johnny.  Worked with pt on healthy boundary setting.   Addressed how  pt can increase her connection with her [name]. Worked on self care strategies. Provided supportive therapy.    Interventions: Cognitive Behavioral Therapy and Insight-Oriented  Diagnosis:F33.1  Plan of Care: Recommend ongoing therapy.   Pt participated in setting treatment goals.  Plan to meet monthly.  Pt agrees with treatment plan.  Treatment Plan Client Abilities/Strengths  Pt is bright, engaging, and motivated for therapy.   Client Treatment Preferences  Individual therapy.  Client Statement of Needs  Improve coping skills.  Symptoms  Depressed or irritable mood. Diminished interest in or enjoyment of activities. Lack of energy. Poor concentration and indecisiveness. Social withdrawal.  Problems Addressed  Unipolar Depression Goals 1. Alleviate depressive symptoms and return to previous level of effective functioning. 2. Appropriately grieve the loss in order to normalize mood and to return to previously adaptive level of functioning. Objective Learn and implement behavioral strategies to overcome depression. Target Date: 2023-12-02 Frequency: monthly  Progress: 55 Modality: individual  Related Interventions Engage the client in behavioral activation, increasing his/her activity level and contact with sources of reward, while identifying processes that inhibit activation.  Use behavioral techniques such as instruction, rehearsal, role-playing, role reversal, as needed, to facilitate activity in the client's daily life; reinforce success. Assist the client in developing skills that increase the likelihood of deriving pleasure from behavioral activation (e.g., assertiveness skills, developing an exercise plan, less internal/more external focus, increased social involvement); reinforce success. Objective Identify important people in life,  past and present, and describe the quality, good and poor, of those relationships. Target Date: 2023-12-02 Frequency: monthly   Progress: 55 Modality: individual  Related Interventions Conduct Interpersonal Therapy beginning with the assessment of the client's interpersonal inventory of important past and present relationships; develop a case formulation linking depression to grief, interpersonal role disputes, role transitions, and/or interpersonal deficits). Objective Learn and implement problem-solving and decision-making skills. Target Date: 2023-12-02 Frequency: monthly  Progress: 55 Modality: individual  Related Interventions Conduct Problem-Solving Therapy using techniques such as psychoeducation, modeling, and role-playing to teach client problem-solving skills (i.e., defining a problem specifically, generating possible solutions, evaluating the pros and cons of each solution, selecting and implementing a plan of action, evaluating the efficacy of the plan, accepting or revising the plan); role-play application of the problem-solving skill to a real life issue. Encourage in the client the development of a positive problem orientation in which problems and solving them are viewed as a natural part of life and not something to be feared, despaired, or avoided. 3. Develop healthy interpersonal relationships that lead to the alleviation and help prevent the relapse of depression. 4. Develop healthy thinking patterns and beliefs about self, others, and the world that lead to the alleviation and help prevent the relapse of depression. 5. Recognize, accept, and cope with feelings of depression. Diagnosis F33.1  Conditions For Discharge Achievement of treatment goals and objectives   [name], LCSW

## 2023-09-24 ENCOUNTER — Other Ambulatory Visit: Payer: Self-pay

## 2023-10-06 ENCOUNTER — Encounter: Payer: Self-pay | Admitting: Psychiatry

## 2023-10-06 ENCOUNTER — Telehealth (INDEPENDENT_AMBULATORY_CARE_PROVIDER_SITE_OTHER): Admitting: Psychiatry

## 2023-10-06 DIAGNOSIS — F423 Hoarding disorder: Secondary | ICD-10-CM | POA: Diagnosis not present

## 2023-10-06 DIAGNOSIS — F331 Major depressive disorder, recurrent, moderate: Secondary | ICD-10-CM

## 2023-10-06 DIAGNOSIS — F40240 Claustrophobia: Secondary | ICD-10-CM

## 2023-10-06 DIAGNOSIS — F411 Generalized anxiety disorder: Secondary | ICD-10-CM | POA: Diagnosis not present

## 2023-10-06 DIAGNOSIS — D500 Iron deficiency anemia secondary to blood loss (chronic): Secondary | ICD-10-CM | POA: Diagnosis not present

## 2023-10-06 DIAGNOSIS — F4000 Agoraphobia, unspecified: Secondary | ICD-10-CM

## 2023-10-06 DIAGNOSIS — G2581 Restless legs syndrome: Secondary | ICD-10-CM | POA: Diagnosis not present

## 2023-10-06 DIAGNOSIS — G250 Essential tremor: Secondary | ICD-10-CM

## 2023-10-06 DIAGNOSIS — F4001 Agoraphobia with panic disorder: Secondary | ICD-10-CM | POA: Diagnosis not present

## 2023-10-06 DIAGNOSIS — R251 Tremor, unspecified: Secondary | ICD-10-CM

## 2023-10-06 NOTE — Progress Notes (Signed)
 Beverly Cole [id number] Aug 15, 1960 63 y.o.  Video Visit via My Chart  I connected with pt by video using My Chart and verified that I am speaking with the correct person using two identifiers.   I discussed the limitations, risks, security and privacy concerns of performing an evaluation and management service by My Chart  and the availability of in person appointments. I also discussed with the patient that there may be a patient responsible charge related to this service. The patient expressed understanding and agreed to proceed.  I discussed the assessment and treatment plan with the patient. The patient was provided an opportunity to ask questions and all were answered. The patient agreed with the plan and demonstrated an understanding of the instructions.   The patient was advised to call back or seek an in-person evaluation if the symptoms worsen or if the condition fails to improve as anticipated.  I provided 30 minutes of video time during this encounter.  The patient was located at home and the provider was located office. Session 330-400 pm  Subjective:   Patient ID:  Beverly Cole is a 63 y.o. (DOB [birth year]) female.  Chief Complaint:  Chief Complaint  Patient presents with   Follow-up   Depression   Anxiety   Fatigue    Beverly Cole  today for follow-up of chronic depression and anxiety.  Lately depression worse than anxiety.  Seen with husband today  When seen May 11, 2018.  For persistent anxiety we elected to retry buspirone  and try increasing it to 30 mg twice daily if tolerated.   Couldn't tolerate it this time DT muscle spasms.  Pharmacy called saying she could have serotonin syndrome.  When seen August 09, 2018 and she refused med changes despite chronic anxiety and depression. She remained on sertraline  50, Depakote  ER 1000 mg, Wellbutrin  SR 200 mg AM  seen December 09, 2018.  Because of chronic depression and dysfunction with fatigue and poor motivation we  decided off label trial  trial of modafinil  200 mg 1/2 each am for 1 week then 200 mg each AM. Did see benefit from it.  More energy and working to stay out of bed more.    seen January 10, 2019.  The modafinil  had been helpful.  No meds were changed.  seen April 07, 2019.  The following was noted: Still sees benefit from modafinil  but still not caring for herself like she should.  Not wanting to go anywhere.  Not driven since early fall.  Hard to breathe with the mask and it creates anxiety.  Hard to be in enclosed spaces like cars and planes for extended periods.   Trying to set her alarm clock.  No hallucinations.  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  Plans to send more too.  Still follow through is not great.  Still depression and productivity is still poor but it is not as poor as it was before modafinil . Plan increase modafinil  to 300 mg daily to see if function can be improved.  06/29/2019 appointment, the following is noted: Rare Xanax .  Did increase modafinil  to 300 mg daily. Saw benefit with the increase with less time in bed but still markedly functionally impaired.   It seems like I get used to it and then doesn't maintain her get up and go.  Still having a hard time with self care like hygiene. Chronic low motivation  and lack of interest is unchanged.   Gall bladder problems.  It's better at the moment.    Not more sad, just like I've always been.  Can't make herself leave the house DT depression and anxiety.  She won't do chores.   Less excess sleeping.  He works from home 2 days weekly.  No periods of hyperactivity or manic sx since the spring. Attending therapy every 2 weeks.   Can be confused when first wakes up regardless of time of day. Had episode of hallucination in the middle of the night when awakened. Scarecrow frightened her. This is rare event.  No hallucinations during the day.  Melatonin makes  it worse. Has had fearful thoughts of planes crashing or bad things happening to others and it might be her fault.  Fight with brother lately and not speaking with him now. Plan:  No med changes  10/11/19 appt with the following noted: Unsteady and tremors gotten worse.  Not gone to doctor. Propranolol  not helping as much.   On Depakote  ER 1000mg  HS which is a reduction. So tired all the time and H says she's not doing anything.  H says accomplishing littte and still lays in bed a lot.  H says it's 2-3 PM before she gets OOB.  Memory is poor.  Everything is such a chore and doesn't want to do things. H says she starts things she doesn't finish.  Lots of new projects.   Increase modafinil  on her own to 400 mg daily in the AM.  Anxious.  Rare Xanax .  Was happy when got new cats in the summer.  Anxious and Depressed and describes anxiety as Moderate. Anxiety symptoms include: Excessive Worry,.  She thinks sertraline  reduces her obsessiveness.  Past hx panic so avoidant. Never get to relax.  Pt reports sleeps excessively even with modafinil . Pt reports that appetite is good. Pt reports that energy is poor and loss of interest or pleasure in usual activities, poor motivation and withdrawn from usual activities. Just don't care about things and admits to a lot of general anxiety and everything takes a lot of energy out of her.  Compulsively tapes and watches TV shows, news and awards shows.  Can't delete it bc I'll miss something.  Watches TV in bed. Concentration is poor. Suicidal thoughts:  denied by patient. But chronic death thoughts.  Poor productivity overall.  Chronic poor self care, showers only weekly.  Just don't care.  Chronic disability.  She thinks sertraline  helped the anxiety some.  Likes that H is at home.  Helps her mood.  Doesn't care about going out. Feels faint when she tries to wear a mask.  Too anxious driving and is avoidant. Collects TV shows, she can't erase unless she watches  entirely. Hoards some bc afraid she might need it some day.   Rare use of Xanax  bc fears addiction. Plan: Attempted referral to neurology at [facility] neurology.  They refused to see patient stating they had nothing to offer her. It was suggested to patient that she try to get in with Dr. Jenel whom she had seen in the past.  01/10/2020 appointment with the following noted: Reduced modafinil  to 300 mg bc didn't want to get hooked on anything about 2-3 weeks.  Did initially see benefit from the modafinil  but seemed to get tolerant to it. Feels better not talking to brother bc differing views of politics.  Feels he's a negative person but never depressed. Overall thinks she's doing pretty  well relatively.  However is chronically depressed and never happy.  Nothing changed with the meds. Depression worse than anxiety. H didn't think modafinil  made much difference.  H CO her inactivity.. She felt it was helpful for energy initially. Had to cancel trips bc can't wear a mask for that long.  Makes her sad. Driven twice in a year or so DT anxiety. Wants prn Xanax . Plan: DC sertraline  Start fluoxetine  20 mg daily with olanzapine  5 mg daily.  05/22/2020 appointment with following noted: Didn't remember to stop sertraline  and took fluoxetine  and olanzapine  for 2 days and couldn't eat and stopped it. Then went to dermatologist December who told her couldn't take Zoloft  with fungus med and stopped sertraline . Then became more depressed.  I have to go back on the Zoloft . Remained on modafinil , Wellbutrin , Depakote .  Even less motivated to do anything like cook, clean.  Won't go to [employer] etc. Chronic depression with hopelessness to get better Plan: No med changes  07/31/2020 appointment with the following noted: Back on sertraline  at 75 mg daily with Remained on modafinil , Wellbutrin , Depakote .  Aunt died and one of worst fears but not thinking about it. Family got Covid but she recovered.  She thought  it might kill and didn't care. Tremors are getting worse and balance problems. Went to concert and that was a problem.  Enjoyed the concert. Also saw Kiss, Elton John.  Music always important to her.  1980 had planned to commit suicide listening to an album over and over but didn't attempt. B and M have familial tremor also. Still anxious and depressed and haven't driven in a year DT anxiety. Plan: Option of trial olanzapine  alone without fluoxetine .  Rec retry.  She agrees.  10/23/2020 appointment with the following noted: Olanzapine  is really working.  Initial SE drunk at 5 mg HS so cut it in half.  Got used to it but I feel happy and more relaxed and easier time going to sleep.  If I could still chose to die I would but clearly happier but not more hopeful.  Laughing more.  No SI. Needs to care about things more.  Chronic lack of motivations. Still doesn't want to leave the house but did enjoy a concert in [place]. Chronic unsteadiness is worse.  Last disc with doctor in March who rec PT but she never did the PT.  Misses aunt who died.   She reduced the Depakote  to 500 mg HS. When stopped sertraline  was not doing well and restarted it bc stressed and unhappy.  Seems to be a key med for me. Pending card test soon.  Feels better than in the past. RLS is worse after starting olanzapine  and even affecting arms but had it before.  Happens after lays down at night.  Takes MG which helps.   Xanax  about once every 3 weeks. Plan: Successful trial olanzapine  2.5 mg with sertraline  50 but worse RLS Trial gabapentin  for RLS 100-300 mg PM and disc SE.  Higher if needed..  Once controlled consider increase olanzapine .  01/23/2021 appointment with the following noted: Taking gabapentin  300 mg but needs to go higher for RLS.  SE dizzy and reduced concentration. Reads on internet.   Increased olanzapine  to 5 mg HS a couple of mos ago and feels better with it.  Happier.  More relaxed.  Only think that's  helped in a long time. Handwriting is so much better, less shakey. Has RLS and can be bad when she lays down. Got  a new kitten and enjoys it.  Now has 4 cats. Afraid to try new meds after negative reaction to Vraylar.  Afraid of any med change.  Afraid of having SI and afraid she'd do it if had SI.   Saw pulmonologist over weakness and he says she's OK except deconditioned.  Can't tolerate wearing the masks. Plan: Successful trial olanzapine  5 mg with sertraline  50 for depression and anxiety but worse RLS Consider increasing the olanzapine  later if RLS can be managed. increase gabapentin  for RLS 400-600 mg PM and disc SE.  Higher if needed..  Once controlled consider increase olanzapine .  03/28/2021 appointment with the following noted: Uses Ivory soap behind knee with benefit.  Increased gabapentin  to 600 mg PM.  Dizziness resolved which occurred when turned over in bed.  Not a problem. RLS still 5/7 days.  But had no vacation RLS bc was walking more.  Not active at home. Sleep is great 8-9 hours and best in a long time. Enjoyed expensive vacation to [facility].  Once in a lifetime thing.  Some things she didn't like for example the cost. Went to Journey concert and others. Depression is better.  Olanzapine  really helped.  Didn't expect it.   Hasn't felt this good in years and H notices.   Caffeine 2 cups hot tea and limited. Plan: Successful trial olanzapine  5 mg with sertraline  50 for depression and anxiety but worse RLS Consider increasing the olanzapine  later if RLS can be managed. increase gabapentin  for ok to increase RLS 600-900 mg PM and disc SE.  Higher if needed..  Once controlled consider increase olanzapine .  05/27/2021 phone call wanting to increase olanzapine  to see if it will further help depression.  She had increased it on her own to 10 mg for a couple of weeks.  She has to increase to 15 mg.  This was agreed  06/26/21 appt noted: On sertraline  50 and olanzapine  15 mg HS with  1200 mg PM gabapentin  for RLS RLS kicks in about 4 hours after olanzapine .  Prefers to stay up late bc doesn't really want to go to sleep.  RLS 5 days per week. Sleep 8 hours. Caffeine 2 cups hot tea and limited. Benefit from increase olanzapine . Helped anxiety and able to go to sleep earlier than usual.   Before trip was happier than I've been in years.  Not real happy with trip and now nothing to look forward to.   SE some dizziness with gabapentin .  But it's better.   Mood still better than before olanzapine . Plan: benefit olanzapine  15 mg with sertraline  50 for depression and anxiety but worse RLS She wants to Consider increasing the olanzapine  later if RLS can be managed. OK increase olanzapine  to 20 mg PM If RLS is worse then will have to reduce.  08/26/2021 appointment with the following noted: Increased olanzapine  20 mg HS.  Not sure if it made a difference.  Terrible time with memorfy ongoing. Sleep good.  Still has RLS and can get pain in her arm including yesterday at 6-7 PM.   Asks about Ozempic for OCD. Depends on H [name]. Anxiety is better and that helps sleep.  Depression better than it was.  No longer thinks of death all the time like she use to.  Still not very active.  Need something to look forward to. Stays up late and likes doing so bc less depressed in evening.   Plan continue olanzapine  20 mg daily with sertraline  75 mg daily  11/26/2021 appointment noted:  seen with VE[facility] [place] Memory is poor.  Forgets TV shows. Taking alprazolam  rarely. Overall thinks she is doing fine.  Still not driving much.  Don't like to leave the house but went outside on the deck some which is improvement. Sleeps 12 hours nightly but then up all day. Happier with olanazapine and thinks the higher doses did help the anxiety. H doesn't see any changes in the last year.  Covid messed her up and she has become housebound.  Not social.  She got used to being inside and no motivation to change it. H  cleans and prepares meals.  Doesn't have motivation for chores. Plan: continue sertraline  50 for depression and anxiety  She wants to Consider increasing the olanzapine  later if RLS can be managed. Reduce olanzapine  to 10 mg HS bc unclear if higher dose helped more than lower. RLS is better with MG Modafinil  300 to 400 mg daily for alertness and off label for treatment resistant depression Trial Auvelity  for TRD 1 in AM for 1 week, then 1 twice daily. DC Wellbutriin  12/25/2021 phone call requesting 90-day prescription of Auvelity  stating that it was working well.  02/19/22 appt noted: Current psych med: rare Xanax , Auvelity  BID, no fluoxetine , modafinil  300 mg AM , olanzapine  10 pm, propranolol  20 BID, sertraline  75 mg daily. Gabapentin  1200 mg daily I feel like doing things more and I see things more clearly.  Realizes now buying things to make myself feel happier and not really a buying problem per se.  Now not driven to buy things she shouldn't.   Feels embarrassed by what she did before with this.  Had bought a lot of expensive baskets and now no longer does that.  She is happier she's better and H notices it too.  Putting things away and better cleaning.  Tired a lot.  Better motivation..  had lunch with friends scheduled for Friday but is cancelling.  Not sure whay she is avoidant socially.  Anxious about it.  Almost fears a panic attack.  Not sure when had last panic but had them when she tried to go to work.  I can't.  Doesn't drive often.  Has a fleeta but doesn't like driving it.  Can't drive at night.   Still sleeps 12 hours in 24.   Happy the way I am right now. Some trouble with restless legs. Plan: no med changes Less depressed with Auvelity  for TRD BID markedly  04/23/22 appt noted: Haven't felt this good in years. Consistent with Auvelity  BID.   Current psych meds: modafinil  300 Am, Auvelity  BID, sertraline  75, olanzapine  10 mg HS, propranolol  20 BID. Asks about going off  olanzapine . 2 cats put to sleep and she did well with it despite in past had SI when doing it. Still sleeps 12 hour daily bc so tired and wants to try stopping it. No RLS on gabapentin .  To [place]  next month.   Plan: continue sertraline  75 for depression and anxiety  Less depressed with Auvelity  for TRD BID markedly, best in years. Ok trial reduction to olanzapine  to 5  mg HS .  After 30 D if well can stop it. RLS is better with MG  06/24/22 appt noted: Not often with alprazolam  other than sleep if needed.   Psych med: Auvelity  BID , gabapentin  1200 pm for back pain, modafinil  300 AM, olanzapine  5 mg HS, sertraline  75 mg daily, propranolol  20 BID, alprazolam  0.5 mg prn When reduced olanzapine  from  10 to 5 then not as happy. But is less sleepy and tired and wants to stay on the lower dose for now. Overall still doing good and very good.  Hurt back packing for [place]  and still some back problems.  Went to [place] .  Had a good time there.  Bad weather cancelled the snorkeling trip.   Overall satisfied with current problems.  Plan no med changes  11/13/22 appt noted: Psych med: Auvelity  BID, gabapentin  600 pm for back pain & RLS, modafinil  300 AM, olanzapine  5 mg HS, sertraline  75 mg daily, propranolol  20 BID, alprazolam  0.5 mg prn Stress cousin bone CA really upsetting.  B needing neck surgery.  H prostate bx pending. CBD oil in drink helps her anxiety.  Just relaxes her and doesn't drive after  it or take it excessively.   Memory is not good.  Can forget what she says in conversation.  Took memory test at PCP and it was normal.  This is not new.   She feels she has maintained the benefit from Auvelity .  Now I don't want to die which is unbelievable bc has wanted to die for 50 years.  Now just starting life again.  But now is worrying about dying.  Both parents had CA and B had CA. Cont problems with RLS.  Will occur in arms as well.  Reversed sleep schedule.  Stay up all night.  Sleep 8-10  hours in day.   Still using caffeine. Plan no changes  01/20/23 appt noted: Meds as above except Reduced gabapentin  to 900 mg pm bc 1200 mg made her sleep 12 hours. Ongoing problems with RLS and not controlled.  It is at night when lays down in legs and arms when it is bad.  RLS for years.   Doubt gabapentin  is helping back pain.  No other SE. Mood is good.  Maintained benefit. Anxiety is under control for her. Problems going to sleep.  Once asleep sleeps 9 hours.   Plan: Start pramipexole  1/2 tablet 90 min before sleep for 1 week then if RLS is not better then increase to 1 tablet in evening Reduce olanzapine  to 1/2 of 5 mg tablet to reduce risk RLS.   Disc risk mania.  Call if develop an of those sx as these changes could trigger mania but RLS is unmanageable at this time. Wean gabapentin  to 600 mg for 1 week then reduce to 1/2 tablet nightly for 1 week and then stop it DT poor resp for RLS and back pain  03/24/23 appt noted: Psych meds: Alprazolam  0.5 twice daily as needed anxiety, Auvelity  twice daily, not taking modafinil  300 every morning, pramipexole  1/2 mg in the evening, propranolol  20 twice daily, sertraline  75  .  Stopped olanzapine  and gabapentin .  Had to stop modafinil  bc couldn't sleep when stopped olanzapine .  Had hot and cold sensations for a month after stopping.  Gone now.  Didn't sleep after stopped both olanzapine  and gabapentin .   Lost wt  5# and diet is better.  Drinking shake.  Not gaining wt.     Current UTI.  Feels really weak.  Low HCT.  High platelets. Not much px with RLS now. Pretty good. I didn't realize how much a zombie I was before when on olanzapine . Mentally really good.  Physically not good.   Sleep is better now but choppy.   Used to sleep 12 hour/d and now 7-8 hour/d.  H says she seems a lot happier and more motivated.  Allergic to taking iron  pills.  Eats red meat.   Riding cycle.   No mood px stopping olanzapine .    06/23/23 appt noted: Med:  Alprazolam  0.5 twice daily as needed anxiety, Auvelity  twice daily, pramipexole  1 mg in the evening, propranolol  20 twice daily, sertraline  75  . Low ferritin and getting infusions twice.  So weak and tired and can't do much.  Vertigo returned but getting better.  CC weakness. So far infusions haven't helped. Enjoyed [place] at [employer]. H [name] promotion at work.  Working a lot of hours.  Plans to work until 63 yo.   Dep a little worse over fatigue and iron  deficiency.  Certainly not as bad as in the past.  Need to hire someone to clean house.  Discovered youtube in November and enjoys the music there.   Don't want to take  the Xanax .  Very rare alcohol.   Plan no med changes.   For RLS, rec continue iron .  She can't take iron  supplements per pt.  Disc this in detail.  She started iron  infusions.    07/15/23 TC:  Pt is currently on Auvelity . She is asking to decrease dose to once a day. She is reporting increased libido on BID dosing. She had the same issue when she was on Wellbutrin  alone. It looks like she has been on Auvelity  for more than a year.  MD resp:  Ok to reduce to 1 in the morning.  Let us  know if she gets more depressed.    Lorene Macintosh, MD, DFAPA      10/06/23 appt noted:  Med: Alprazolam  0.5 twice daily as needed anxiety, Auvelity  once daily, pramipexole  1 mg in the evening, propranolol  20 twice daily, sertraline  75  . Initially more depressed with less Auvelity  to one daily so then reduced it more gradually.  Had a lot of things going on.  Recent incr Libido suddenly with Auvelity .  It is manageable. Had skin CA removed.  Yesterday MRI for H's prostate CA.   Saw eye doctor with beginnings of retinal detachment addressed.   Taking a trauma class for 8 mos.  One session.  Happy with it so far.   I didn't know I had these problems until I went on YouTube.  The reason I think the way I do is bc of my childhood.    Got involved with guy in [place]  who sucked her in and was ready to move  to [place].  I was crazy.  That seems to be ending.  Going on since April.  Felt like she got sucked into a cult.  Hard to explain.  I would've done anything for him.  I wanted him to be happy rather than me to be happy.  Got involved with his friend too.  Recognized his narcissism.  Led to some loss of YouTube friends over it but is ok with it.    He's only 103 yo. H [name] knows it now.  Totally done with it.  Happier now than in a long time.  Was a good lessen.    Does worry over whether he is hacking her phone and computer.  [name] promoted at work and so busy, so the guy giving her attention got her sucked in.  Better with H now.  Some trouble with sleeping partly bc stays up late on YouTube watching a guitar guy.  I herself on youtube.  Denies other manic sx.  Used to be obsessed with individual guys in  her 14s.   Feels ok with meds the way she is now.  But thinks she might have to go down in Auvelity  again bc feels libido will get worse again.     Past psych med trials:  Wellbutrin  XL 300 incr libido, Sertraline  75, fluoxetine  SE brief ,  Auvelity  marked positive response  Vraylar SI, Latuda, Abilify , olanzapine  20 Symbyax side effects Seroquel  which caused side effects of constipation,  Depakote  ER 1500mg  HS  lamotrigine,  refuses lithium  because of altered taste.    Pramipexole  1 mg in 2019? effect Modafinil  300-400  Hx primidone sed Propranolol  20 BID helps tremor and anxiety  She has a history of ataxia on 1500 mg of Depakote .   buspirone  SE hallucinations Gabapentin  for RLS  ECT 2016 without help,   Review of Systems:  Review of Systems  Constitutional:  Positive for fatigue.  Respiratory:  Positive for shortness of breath.   Cardiovascular:  Negative for chest pain.  Gastrointestinal:  Negative for abdominal pain and vomiting.  Musculoskeletal:  Positive for gait problem.  Neurological:  Positive for dizziness and tremors. Negative for weakness.       RLS   Psychiatric/Behavioral:  Positive for decreased concentration. Negative for agitation, behavioral problems, confusion, dysphoric mood, hallucinations, self-injury, sleep disturbance and suicidal ideas. The patient is nervous/anxious. The patient is not hyperactive.     Medications: I have reviewed the patient's current medications.  Current Outpatient Medications  Medication Sig Dispense Refill   ALPRAZolam  (XANAX ) 0.5 MG tablet Take 1 tablet (0.5 mg total) by mouth 2 (two) times daily as needed. 30 tablet 0   aspirin  EC 325 MG tablet Take 325 mg by mouth daily.     azelastine  (ASTELIN ) 0.1 % nasal spray Place 1 spray into both nostrils 2 (two) times daily as directed 30 mL 12   Dextromethorphan -buPROPion  ER (AUVELITY ) 45-105 MG TBCR Take 1 tablet by mouth 2 (two) times daily. (Patient taking differently: Take 1 tablet by mouth 2 (two) times daily. 1 daily) 60 tablet 3   diltiazem  (CARTIA  XT) 180 MG 24 hr capsule Take 1 capsule (180 mg total) by mouth daily. 90 capsule 1   Efinaconazole  (JUBLIA ) 10 % SOLN Apply 1 Application topically daily. 8 mL 5   fexofenadine  (ALLEGRA  ALLERGY) 180 MG tablet Take 1 tablet (180 mg total) by mouth daily.     fluticasone  (FLONASE ) 50 MCG/ACT nasal spray Place 2 sprays into the nose daily as needed. For allergies     mupirocin  ointment (BACTROBAN ) 2 % Apply 1 Application topically 2 (two) times daily  until clear 30 g 4   NONFORMULARY OR COMPOUNDED ITEM sm Azelaic acid / metronidazole /ivermectin 15%/1%/1% cream apply to face bid 1 each 5   nystatin  powder Apply 1 Application topically 3 (three) times daily. 15 g 0   pramipexole  (MIRAPEX ) 1 MG tablet Take 1 tablet (1 mg total) by mouth every evening. 90 tablet 1   Probiotic Product (PROBIOTIC DAILY PO) Take 1 tablet by mouth daily.      propranolol  (INDERAL ) 20 MG tablet Take 1 tablet (20 mg total) by mouth 2 (two) times daily. 180 tablet 1   RABEprazole  (ACIPHEX ) 20 MG tablet Take 1 tablet (20 mg total) by mouth  daily. 90 tablet 3   sertraline  (ZOLOFT ) 50 MG tablet Take 1&1/2 tablets (75 mg total) by mouth daily. 135 tablet 1   tretinoin  (RETIN-A ) 0.025 % cream Apply 1 application  topically at bedtime. 45 g 11   trimethoprim  (TRIMPEX )  100 MG tablet Take 1 tablet (100 mg total) by mouth as directed after intercourse. 30 tablet 2   dexlansoprazole  (DEXILANT ) 60 MG capsule Take 1 capsule (60 mg total) by mouth daily. 90 capsule 3   No current facility-administered medications for this visit.    Medication Side Effects: None  Allergies:  Allergies  Allergen Reactions   Cariprazine Other (See Comments)    Patient becomes suicidal when taking this medication.  Patient becomes suicidal when taking this medication.    Metoprolol  Other (See Comments)    Hallucinations hair falls out Hallucinations hair falls out   Nickel Rash   Iron  Nausea And Vomiting    Past Medical History:  Diagnosis Date   Anxiety    Atrial fibrillation (HCC)    a. Event monitor 2013 - PACs/bradycardia/SVT/short run of atrial fib by event monitor.    BIPOLAR DISORDER UNSPECIFIED    Bradycardia    Bursitis of hip 09/1999   Bilateral - Dr. Yvone   DEPRESSION    Emphysema lung (HCC) 06/20/2016   pt states pulmonalongist stated started recently   GERD    Headache(784.0)    was with menstrual cycle. no longer a problem   HYPERLIPIDEMIA    Hypertension    Hypertension    IRRITABLE BOWEL SYNDROME, HX OF    Menopausal state 01/2012   [facility] = 88.5   Mitral valve prolapse 12/17/2000   a. dx 1980s, most recent echo did not demonstrate this.   MYALGIA    Normal coronary arteries    a. by cardiac CT 2013.   PAT (paroxysmal atrial tachycardia) (HCC)    PE (pulmonary embolism) 02/09/2015   a. Bilateral PEs 01/2015 when d-dimer 0.69, CP lying on left side. Followed by heme-onc - + lupus anticoagulant, mildly depressed protein S. Dr. Timmy is not certain if her hypercoagulable studies are significant for a thrombophilic  state.   Premature atrial contractions    Pulmonary embolus (HCC) 02/10/2015   Shortness of breath    Pulmonary eval 05/2002   Squamous cell carcinoma in situ (SCCIS) 08/24/2023   Anterior K forearm - ED&C needed    Thyroid  nodule 2014   Tremors of nervous system     Family History  Problem Relation Age of Onset   Lung cancer Mother    Hypertension Mother    Hyperlipidemia Mother    Cancer Father        Esophageal cancer   Hyperlipidemia Father    Depression Father    Cancer Brother    Pulmonary embolism Brother    Colon cancer Paternal Uncle    Sarcoidosis Other    Testicular cancer Other     Social History   Socioeconomic History   Marital status: Married    Spouse name: Not on file   Number of children: 0   Years of education: Not on file   Highest education level: Associate degree: academic program  Occupational History   Occupation: Nurse-Personal Care/HH    Employer: UNEMPLOYED  Tobacco Use   Smoking status: Former    Current packs/day: 0.00    Average packs/day: 1 pack/day for 25.0 years (25.0 ttl pk-yrs)    Types: Cigarettes    Start date: 02/11/1971    Quit date: 02/11/1996    Years since quitting: 27.6   Smokeless tobacco: Never   Tobacco comments:    Married, lives with spouse. 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       No exercise   Social Drivers of Health   Financial Resource Strain: Low Risk  (08/11/2023)   Overall Financial Resource Strain (CARDIA)    Difficulty of Paying Living Expenses: Not hard at all  Food Insecurity: No Food Insecurity (08/11/2023)   Hunger Vital Sign    Worried About Running Out of Food in the Last  Year: Never true    Ran Out of Food in the Last Year: Never true  Transportation Needs: No Transportation Needs (08/11/2023)   PRAPARE - Administrator, Civil Service (Medical): No    Lack of Transportation (Non-Medical): No  Physical Activity: Inactive (08/11/2023)   Exercise Vital Sign    Days of Exercise per Week: 0 days    Minutes of Exercise per Session: Not on file  Stress: Stress Concern Present (08/11/2023)   [employer] of Occupational Health - Occupational Stress Questionnaire    Feeling of Stress: Very much  Social Connections: Socially Isolated (08/11/2023)   Social Connection and Isolation Panel    Frequency of Communication with Friends and Family: Once a week    Frequency of Social Gatherings with Friends and Family: Never    Attends Religious Services: Never    Database administrator or Organizations: No    Attends Engineer, structural: Not on file    Marital Status: Married  Catering manager Violence: Not on file    Past Medical History, Surgical history, Social history, and Family history were reviewed and updated as appropriate.   Please see review of systems for further details on the patient's review from today.   Objective:   Physical Exam:  LMP 05/11/2012   Physical Exam Neurological:     Mental Status: She is alert and oriented to person, place, and time.     Cranial Nerves: No dysarthria.  Psychiatric:        Attention and Perception: Attention and perception normal.        Mood and Affect: Mood is anxious. Mood is not depressed. Affect is not tearful.        Speech: Speech normal.        Behavior: Behavior is cooperative.        Thought Content: Thought content normal. Thought content is not paranoid or delusional. Thought content does not include homicidal or suicidal ideation. Thought content does not include suicidal plan.        Cognition and Memory: Cognition and memory normal.     Comments: Insight and judgment fair and  less impulsive Chronic anxiety and avoidant Best mood in years.   Still better with meds. Good humor.       Lab Review:     Component Value Date/Time   NA 141 04/30/2023 1540   NA 145 (H) 04/05/2020 1508   NA 142 01/21/2017 1013   NA 143 01/16/2016 1005   K 4.3 04/30/2023 1540   K 3.2 (L) 01/21/2017 1013   K 3.3 (L) 01/16/2016 1005   CL 104 04/30/2023 1540   CL 106 01/21/2017 1013   CO2 26 04/30/2023 1540   CO2 29 01/21/2017 1013   CO2 20 (  L) 01/16/2016 1005   GLUCOSE 100 (H) 04/30/2023 1540   GLUCOSE 95 01/21/2017 1013   BUN 15 04/30/2023 1540   BUN 14 04/05/2020 1508   BUN 9 01/21/2017 1013   BUN 12.6 01/16/2016 1005   CREATININE 0.87 04/30/2023 1540   CREATININE 0.94 12/20/2019 1449   CREATININE 0.9 01/21/2017 1013   CREATININE 0.9 01/16/2016 1005   CALCIUM  9.9 04/30/2023 1540   CALCIUM  9.2 01/21/2017 1013   CALCIUM  9.5 01/16/2016 1005   PROT 7.3 04/30/2023 1540   PROT 7.2 04/05/2020 1508   PROT 6.9 01/21/2017 1013   PROT 7.1 01/16/2016 1005   ALBUMIN 4.5 04/30/2023 1540   ALBUMIN 4.6 04/05/2020 1508   ALBUMIN 3.6 01/16/2016 1005   AST 21 04/30/2023 1540   AST 13 (L) 12/20/2019 1449   AST 16 01/16/2016 1005   ALT 20 04/30/2023 1540   ALT 15 12/20/2019 1449   ALT 22 01/21/2017 1013   ALT 24 01/16/2016 1005   ALKPHOS 91 04/30/2023 1540   ALKPHOS 62 01/21/2017 1013   ALKPHOS 81 01/16/2016 1005   BILITOT 0.3 04/30/2023 1540   BILITOT <0.2 04/05/2020 1508   BILITOT 0.3 12/20/2019 1449   BILITOT 0.59 01/16/2016 1005   GFRNONAA >60 04/13/2023 1530   GFRNONAA >60 12/20/2019 1449   GFRAA 87 04/05/2020 1508   GFRAA >60 12/03/2018 1346       Component Value Date/Time   WBC 7.8 07/17/2023 1236   WBC 8.0 06/04/2023 1603   RBC 5.67 (H) 07/17/2023 1237   RBC 5.70 (H) 07/17/2023 1236   HGB 14.7 07/17/2023 1236   HGB 13.7 04/05/2020 1508   HGB 12.8 01/21/2017 1013   HCT 46.6 (H) 07/17/2023 1236   HCT 41.4 04/05/2020 1508   HCT 38.2 01/21/2017 1013   PLT  274 07/17/2023 1236   PLT 289 04/05/2020 1508   MCV 81.8 07/17/2023 1236   MCV 85 04/05/2020 1508   MCV 90 01/21/2017 1013   MCH 25.8 (L) 07/17/2023 1236   MCHC 31.5 07/17/2023 1236   RDW 19.9 (H) 07/17/2023 1236   RDW 15.0 04/05/2020 1508   RDW 14.5 01/21/2017 1013   LYMPHSABS 2.8 07/17/2023 1236   LYMPHSABS 2.5 04/05/2020 1508   LYMPHSABS 3.6 (H) 01/21/2017 1013   MONOABS 0.4 07/17/2023 1236   EOSABS 0.2 07/17/2023 1236   EOSABS 0.1 04/05/2020 1508   EOSABS 0.5 01/21/2017 1013   BASOSABS 0.1 07/17/2023 1236   BASOSABS 0.1 04/05/2020 1508   BASOSABS 0.0 01/21/2017 1013    No results found for: POCLITH, LITHIUM    Lab Results  Component Value Date   PHENYTOIN <0.5 ug/mL (L) 11/05/2006   VALPROATE 73.7 11/25/2016    03/23/23 ferritin low at 7.   .res Assessment: Plan:    Vidalia was seen today for follow-up, depression, anxiety and fatigue.  Diagnoses and all orders for this visit:  Major depressive disorder, recurrent episode, moderate (HCC)  Generalized anxiety disorder  Panic disorder with agoraphobia  Claustrophobia  Uncontrolled restless legs syndrome  Benign essential tremor  Agoraphobia  Hoarding behavior  Tremor  Iron  deficiency anemia due to chronic blood loss    TRD.  Lifelong history of chronic anhedonic depression and anxiety with very poor functioning . She is highly dysfunctional historically but much better with Auvelity .  She is not currently manic.  She is tolerating the medications.  She has had multiple medication failures as noted above.  Extremely complicated patient.  It is not entirely clear to me as towhether  she truly has bipolar depression or TR major depression.  She has never been manic while under my care.  She is not worse with less mood stabilizer.  Disc risk mania if she is actually bipolar.  She doesn't want more mood stabilizer trials. However recent episode marked severe incr libido she attributed to Auvelity  but has  been on same dose for mos.  Has become more manageable.  Denies other manic sx except staying up late on YouTube.  Watch for this.  However her depression and treatment resistant anxiety were improved with olanzapine  and sertraline  in combination. Best response ever with current med regimen including with Auvelity  addition.   Has been able to stop olanzapine  and is pleased she is can lose weight now and she is not as sedated and sleeping more normal hours.  She does not need to take the modafinil  now.  Mood much better with current combo her depression still well-controlled and anxiety is adequately controlled.  She is not having manic symptoms or other mood swings.  No problems off the olanzapine  except no appetite  RLS is unmanaged again but started iron   Caffeine can worsen RLS.  Disc timing in relation to olanzapine .  Discussed potential metabolic side effects associated with atypical antipsychotics, as well as potential risk for movement side effects. Advised pt to contact office if movement side effects occur.  For tremor is considering increasing the propranolol  continue 20 BID She'll check with cardiologist on this.  Push fluids bc so much Time in bed and past history of dehydration. Disc risk propranolol  but it helps tremor.  Continue therapy.     No med changes:  Continue Alprazolam  0.5 mg twice daily as needed,  Auvelity  once daily, ,  sertraline  75 mg daily and consider weaning propranolol  20 mg twice daily for tremor is controlled  For RLS, rec continue iron  .  She has it being corrected for iron  deficiency.  She can't take iron  supplements per pt.  Disc this in detail.  She started iron  infusions.    FU 3 mos  Lorene Macintosh, MD, DFAPA   Please see After Visit Summary for patient specific instructions.  Future Appointments  Date Time Provider Department Center  10/15/2023  3:00 PM Bauert, Veva ORN, LCSW [facility] None  10/23/2023  1:30 PM [facility] LAB [facility] None   10/23/2023  1:45 PM Franchot Lauraine HERO, NP [facility] None  11/12/2023  3:00 PM Bauert, Veva ORN, LCSW [facility] None  08/30/2024  3:45 PM Alm Delon SAILOR, DO [facility] None    No orders of the defined types were placed in this encounter.     -------------------------------

## 2023-10-13 ENCOUNTER — Other Ambulatory Visit: Payer: Self-pay

## 2023-10-13 ENCOUNTER — Other Ambulatory Visit (HOSPITAL_COMMUNITY): Payer: Self-pay

## 2023-10-15 ENCOUNTER — Ambulatory Visit (INDEPENDENT_AMBULATORY_CARE_PROVIDER_SITE_OTHER): Admitting: Psychology

## 2023-10-15 ENCOUNTER — Telehealth: Admitting: Psychiatry

## 2023-10-15 DIAGNOSIS — F331 Major depressive disorder, recurrent, moderate: Secondary | ICD-10-CM

## 2023-10-15 NOTE — Progress Notes (Signed)
 [facility] Counselor/Therapist Progress Note  Patient ID: Beverly Cole, MRN: [id number],    Date: 10/15/2023  Time Spent: 3:00pm-3:50pm  50 minutes   Treatment Type: Individual Therapy  Reported Symptoms: stress, loneliness  Mental Status Exam: Appearance:  Casual     Behavior: Appropriate  Motor: Normal  Speech/Language:  Normal Rate  Affect: Appropriate  Mood: normal  Thought process: normal  Thought content:   WNL  Sensory/Perceptual disturbances:   WNL  Orientation: oriented to person, place, time/date, and situation  Attention: Good  Concentration: Good  Memory: WNL  Fund of knowledge:  Good  Insight:   Good  Judgment:  Good  Impulse Control: Good   Risk Assessment: Danger to Self:  No Self-injurious Behavior: No Danger to Others: No Duty to Warn:no Physical Aggression / Violence:No  Access to Firearms a concern: No  Gang Involvement:No   Subjective: Pt present for face-to-face individual therapy via video.  Pt consents to telehealth video session and is aware of limitations and benefits of virtual sessions.  Location of pt: home Location of therapist: home office.  Pt states she is feeling good and has felt happy the past few weeks.   She is going to a workshop about recovery from trauma and is working on [employer].  She is realizing she feels responsible for everyone's happiness.   This is overwhelming for her.   Pt is still on You Tube late at night and she greets people she knows on there.  She has gotten positive feedback from others who watch an Designer, television/film set.   Pt has also stopped talking to the guy Beverly Cole online who she says is a narscissist.   Pt states that yesterday she disconnected with a guy online that was not good for her to be in contact with.    Worked with pt on healthy boundary setting.   Addressed how pt can increase her connection with her husband Beverly Cole.  Pt is planning some date nights with Beverly Cole.   They are  also planning to go to concerts together.   Pt saw her psychiatrist Dr. Geoffry and she told him about everything.  Dr. Geoffry kept her medications the same for now.   Pt states her sexual urges have quieted down.  She will see Dr. Geoffry again in 3 months.   Worked on self care strategies. Provided supportive therapy.    Interventions: Cognitive Behavioral Therapy and Insight-Oriented  Diagnosis:F33.1  Plan of Care: Recommend ongoing therapy.   Pt participated in setting treatment goals.  Plan to meet monthly.  Pt agrees with treatment plan.  Treatment Plan Client Abilities/Strengths  Pt is bright, engaging, and motivated for therapy.   Client Treatment Preferences  Individual therapy.  Client Statement of Needs  Improve coping skills.  Symptoms  Depressed or irritable mood. Diminished interest in or enjoyment of activities. Lack of energy. Poor concentration and indecisiveness. Social withdrawal.  Problems Addressed  Unipolar Depression Goals 1. Alleviate depressive symptoms and return to previous level of effective functioning. 2. Appropriately grieve the loss in order to normalize mood and to return to previously adaptive level of functioning. Objective Learn and implement behavioral strategies to overcome depression. Target Date: 2023-12-02 Frequency: monthly  Progress: 55 Modality: individual  Related Interventions Engage the client in behavioral activation, increasing his/her activity level and contact with sources of reward, while identifying processes that inhibit activation.  Use behavioral techniques such as instruction, rehearsal, role-playing, role reversal, as needed, to facilitate activity in  the client's daily life; reinforce success. Assist the client in developing skills that increase the likelihood of deriving pleasure from behavioral activation (e.g., assertiveness skills, developing an exercise plan, less internal/more external focus, increased social  involvement); reinforce success. Objective Identify important people in life, past and present, and describe the quality, good and poor, of those relationships. Target Date: 2023-12-02 Frequency: monthly  Progress: 55 Modality: individual  Related Interventions Conduct Interpersonal Therapy beginning with the assessment of the client's interpersonal inventory of important past and present relationships; develop a case formulation linking depression to grief, interpersonal role disputes, role transitions, and/or interpersonal deficits). Objective Learn and implement problem-solving and decision-making skills. Target Date: 2023-12-02 Frequency: monthly  Progress: 55 Modality: individual  Related Interventions Conduct Problem-Solving Therapy using techniques such as psychoeducation, modeling, and role-playing to teach client problem-solving skills (i.e., defining a problem specifically, generating possible solutions, evaluating the pros and cons of each solution, selecting and implementing a plan of action, evaluating the efficacy of the plan, accepting or revising the plan); role-play application of the problem-solving skill to a real life issue. Encourage in the client the development of a positive problem orientation in which problems and solving them are viewed as a natural part of life and not something to be feared, despaired, or avoided. 3. Develop healthy interpersonal relationships that lead to the alleviation and help prevent the relapse of depression. 4. Develop healthy thinking patterns and beliefs about self, others, and the world that lead to the alleviation and help prevent the relapse of depression. 5. Recognize, accept, and cope with feelings of depression. Diagnosis F33.1  Conditions For Discharge Achievement of treatment goals and objectives   Beverly Alma, LCSW

## 2023-10-23 ENCOUNTER — Other Ambulatory Visit

## 2023-10-23 ENCOUNTER — Ambulatory Visit: Admitting: Family

## 2023-10-31 ENCOUNTER — Other Ambulatory Visit: Payer: Self-pay | Admitting: Family Medicine

## 2023-10-31 DIAGNOSIS — N952 Postmenopausal atrophic vaginitis: Secondary | ICD-10-CM

## 2023-11-02 ENCOUNTER — Other Ambulatory Visit: Payer: Self-pay

## 2023-11-02 ENCOUNTER — Other Ambulatory Visit (HOSPITAL_COMMUNITY): Payer: Self-pay

## 2023-11-02 MED ORDER — TRIMETHOPRIM 100 MG PO TABS
100.0000 mg | ORAL_TABLET | ORAL | 2 refills | Status: DC
  Filled 2023-11-02: qty 30, 30d supply, fill #0
  Filled 2023-12-23: qty 30, 30d supply, fill #1
  Filled 2024-01-24: qty 30, 30d supply, fill #2

## 2023-11-06 ENCOUNTER — Inpatient Hospital Stay: Attending: Hematology & Oncology

## 2023-11-06 ENCOUNTER — Inpatient Hospital Stay (HOSPITAL_BASED_OUTPATIENT_CLINIC_OR_DEPARTMENT_OTHER): Admitting: Family

## 2023-11-06 VITALS — BP 97/54 | HR 71 | Temp 97.7°F | Resp 18 | Ht 69.0 in | Wt 160.1 lb

## 2023-11-06 DIAGNOSIS — Z79899 Other long term (current) drug therapy: Secondary | ICD-10-CM | POA: Diagnosis not present

## 2023-11-06 DIAGNOSIS — D509 Iron deficiency anemia, unspecified: Secondary | ICD-10-CM | POA: Insufficient documentation

## 2023-11-06 DIAGNOSIS — Z86711 Personal history of pulmonary embolism: Secondary | ICD-10-CM | POA: Insufficient documentation

## 2023-11-06 DIAGNOSIS — Z7982 Long term (current) use of aspirin: Secondary | ICD-10-CM | POA: Insufficient documentation

## 2023-11-06 DIAGNOSIS — D6862 Lupus anticoagulant syndrome: Secondary | ICD-10-CM | POA: Diagnosis not present

## 2023-11-06 LAB — CBC WITH DIFFERENTIAL (CANCER CENTER ONLY)
Abs Immature Granulocytes: 0.07 K/uL (ref 0.00–0.07)
Basophils Absolute: 0.1 K/uL (ref 0.0–0.1)
Basophils Relative: 1 %
Eosinophils Absolute: 0.2 K/uL (ref 0.0–0.5)
Eosinophils Relative: 2 %
HCT: 47.5 % — ABNORMAL HIGH (ref 36.0–46.0)
Hemoglobin: 15.6 g/dL — ABNORMAL HIGH (ref 12.0–15.0)
Immature Granulocytes: 1 %
Lymphocytes Relative: 36 %
Lymphs Abs: 3.2 K/uL (ref 0.7–4.0)
MCH: 28.7 pg (ref 26.0–34.0)
MCHC: 32.8 g/dL (ref 30.0–36.0)
MCV: 87.5 fL (ref 80.0–100.0)
Monocytes Absolute: 0.5 K/uL (ref 0.1–1.0)
Monocytes Relative: 5 %
Neutro Abs: 5 K/uL (ref 1.7–7.7)
Neutrophils Relative %: 55 %
Platelet Count: 281 K/uL (ref 150–400)
RBC: 5.43 MIL/uL — ABNORMAL HIGH (ref 3.87–5.11)
RDW: 14.5 % (ref 11.5–15.5)
WBC Count: 9 K/uL (ref 4.0–10.5)
nRBC: 0 % (ref 0.0–0.2)

## 2023-11-06 LAB — RETICULOCYTES
Immature Retic Fract: 7.4 % (ref 2.3–15.9)
RBC.: 5.41 MIL/uL — ABNORMAL HIGH (ref 3.87–5.11)
Retic Count, Absolute: 95.8 K/uL (ref 19.0–186.0)
Retic Ct Pct: 1.8 % (ref 0.4–3.1)

## 2023-11-06 LAB — IRON AND IRON BINDING CAPACITY (CC-WL,HP ONLY)
Iron: 84 ug/dL (ref 28–170)
Saturation Ratios: 19 % (ref 10.4–31.8)
TIBC: 441 ug/dL (ref 250–450)
UIBC: 357 ug/dL

## 2023-11-06 LAB — FERRITIN: Ferritin: 200 ng/mL (ref 11–307)

## 2023-11-06 NOTE — Progress Notes (Signed)
 Hematology and Oncology Follow Up Visit  Beverly Cole [id number] April 01, 1960 63 y.o. 11/06/2023   Principle Diagnosis:  Iron  deficiency anemia  History of bilateral PE with positive lupus anticoagulant and mildly depressed protein S level both of which resolved    Current Therapy:        IV iron  as indicated Full dose aspirin  once daily   Interim History:  Beverly Cole is here today for follow-up. She is doing fairly well. She notes fatigue, weakness, SOB with exertion and numbness in the fingertips.  She has not noted any obvious blood loss. No abnormal bruising, no petechiae.  She is under a lot of stress at home. She has been seeing a therapist.  She does not sleep well at night.  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 Incision site is clean, dry and intact.  No fever, chills, n/v, cough, rash, dizziness, chest pain, palpitations, abdominal pain or changes in bowel or bladder habits.  No swelling in her extremities.  No falls or syncope reported. She ambulates with a cane for added support.  Appetite is good and hydration is fair. Weight is stable at 160 lbs.   ECOG Performance Status: 1 - Symptomatic but completely ambulatory  Medications:  Allergies as of 11/06/2023       Reactions   Cariprazine Other (See Comments)   Patient becomes suicidal when taking this medication.  Patient becomes suicidal when taking this medication.    Metoprolol  Other (See Comments)   Hallucinations hair falls out Hallucinations hair falls out   Nickel Rash   Iron  Nausea And Vomiting        Medication List        Accurate as of November 06, 2023  2:23 PM. If you have any questions, ask your nurse or doctor.          ALPRAZolam  0.5 MG tablet Commonly known as: XANAX  Take 1 tablet (0.5 mg total) by mouth 2 (two) times daily as needed.   aspirin  EC 325 MG tablet Take 325 mg by mouth daily.   Auvelity  45-105 MG Tbcr Generic drug:  Dextromethorphan -buPROPion  ER Take 1 tablet by mouth 2 (two) times daily. What changed: additional instructions   Azelastine  HCl 137 MCG/SPRAY Soln Place 1 spray into both nostrils 2 (two) times daily as directed   dexlansoprazole  60 MG capsule Commonly known as: DEXILANT  Take 1 capsule (60 mg total) by mouth daily.   diltiazem  180 MG 24 hr capsule Commonly known as: Cartia  XT Take 1 capsule (180 mg total) by mouth daily.   fexofenadine  180 MG tablet Commonly known as: Allegra  Allergy Take 1 tablet (180 mg total) by mouth daily.   fluticasone  50 MCG/ACT nasal spray Commonly known as: FLONASE  Place 2 sprays into the nose daily as needed. For allergies   Jublia  10 % Soln Generic drug: Efinaconazole  Apply 1 Application topically daily.   mupirocin  ointment 2 % Commonly known as: BACTROBAN  Apply 1 Application topically 2 (two) times daily  until clear   NONFORMULARY OR COMPOUNDED ITEM sm Azelaic acid / metronidazole /ivermectin 15%/1%/1% cream apply to face bid   nystatin  powder Commonly known as: nystatin  Apply 1 Application topically 3 (three) times daily.   pramipexole  1 MG tablet Commonly known as: MIRAPEX  Take 1 tablet (1 mg total) by mouth every evening.   PROBIOTIC DAILY PO Take 1 tablet by mouth daily.   propranolol  20 MG tablet Commonly known as: INDERAL  Take 1 tablet (20 mg total) by  mouth 2 (two) times daily.   RABEprazole  20 MG tablet Commonly known as: ACIPHEX  Take 1 tablet (20 mg total) by mouth daily.   sertraline  50 MG tablet Commonly known as: ZOLOFT  Take 1&1/2 tablets (75 mg total) by mouth daily.   tretinoin  0.025 % cream Commonly known as: RETIN-A  Apply 1 application  topically at bedtime.   trimethoprim  100 MG tablet Commonly known as: TRIMPEX  Take 1 tablet (100 mg total) by mouth as directed after intercourse.        Allergies:  Allergies  Allergen Reactions   Cariprazine Other (See Comments)    Patient becomes suicidal when  taking this medication.  Patient becomes suicidal when taking this medication.    Metoprolol  Other (See Comments)    Hallucinations hair falls out Hallucinations hair falls out   Nickel Rash   Iron  Nausea And Vomiting    Past Medical History, Surgical history, Social history, and Family History were reviewed and updated.  Review of Systems: All other 10 point review of systems is negative.   Physical Exam:  height is 5' 9 (1.753 m) and weight is 160 lb 1.9 oz (72.6 kg). Her oral temperature is 97.7 F (36.5 C). Her blood pressure is 97/54 (abnormal) and her pulse is 71. Her respiration is 18 and oxygen saturation is 97%.   Wt Readings from Last 3 Encounters:  11/06/23 160 lb 1.9 oz (72.6 kg)  08/17/23 162 lb 9.6 oz (73.8 kg)  07/17/23 164 lb (74.4 kg)    Ocular: Sclerae unicteric, pupils equal, round and reactive to light Ear-nose-throat: Oropharynx clear, dentition fair Lymphatic: No cervical or supraclavicular adenopathy Lungs no rales or rhonchi, good excursion bilaterally Heart regular rate and rhythm, no murmur appreciated Abd soft, nontender, positive bowel sounds MSK no focal spinal tenderness, no joint edema Neuro: non-focal, well-oriented, appropriate affect Breasts: Deferred   Lab Results  Component Value Date   WBC 9.0 11/06/2023   HGB 15.6 (H) 11/06/2023   HCT 47.5 (H) 11/06/2023   MCV 87.5 11/06/2023   PLT 281 11/06/2023   Lab Results  Component Value Date   FERRITIN 286 07/17/2023   IRON  100 07/17/2023   TIBC 361 07/17/2023   UIBC 261 07/17/2023   IRONPCTSAT 28 07/17/2023   Lab Results  Component Value Date   RETICCTPCT 1.8 11/06/2023   RBC 5.43 (H) 11/06/2023   RBC 5.41 (H) 11/06/2023   RETICCTABS 82,240 (H) 06/04/2023   No results found for: KPAFRELGTCHN, LAMBDASER, KAPLAMBRATIO No results found for: IGGSERUM, IGA, IGMSERUM No results found for: STEPHANY CARLOTA BENSON MARKEL EARLA JOANNIE DOC VICK,  SPEI   Chemistry      Component Value Date/Time   NA 141 04/30/2023 1540   NA 145 (H) 04/05/2020 1508   NA 142 01/21/2017 1013   NA 143 01/16/2016 1005   K 4.3 04/30/2023 1540   K 3.2 (L) 01/21/2017 1013   K 3.3 (L) 01/16/2016 1005   CL 104 04/30/2023 1540   CL 106 01/21/2017 1013   CO2 26 04/30/2023 1540   CO2 29 01/21/2017 1013   CO2 20 (L) 01/16/2016 1005   BUN 15 04/30/2023 1540   BUN 14 04/05/2020 1508   BUN 9 01/21/2017 1013   BUN 12.6 01/16/2016 1005   CREATININE 0.87 04/30/2023 1540   CREATININE 0.94 12/20/2019 1449   CREATININE 0.9 01/21/2017 1013   CREATININE 0.9 01/16/2016 1005      Component Value Date/Time   CALCIUM  9.9 04/30/2023 1540   CALCIUM  9.2 01/21/2017  1013   CALCIUM  9.5 01/16/2016 1005   ALKPHOS 91 04/30/2023 1540   ALKPHOS 62 01/21/2017 1013   ALKPHOS 81 01/16/2016 1005   AST 21 04/30/2023 1540   AST 13 (L) 12/20/2019 1449   AST 16 01/16/2016 1005   ALT 20 04/30/2023 1540   ALT 15 12/20/2019 1449   ALT 22 01/21/2017 1013   ALT 24 01/16/2016 1005   BILITOT 0.3 04/30/2023 1540   BILITOT <0.2 04/05/2020 1508   BILITOT 0.3 12/20/2019 1449   BILITOT 0.59 01/16/2016 1005       Impression and Plan: Beverly Cole is a pleasant 63 yo caucasian female with IDA not responsive to oral iron .  Iron  studies are pending. We will replace if needed.  Follow-up in 3 months.     Lauraine Pepper, NP 9/26/20252:23 PM

## 2023-11-12 ENCOUNTER — Ambulatory Visit: Admitting: Psychology

## 2023-11-12 DIAGNOSIS — F331 Major depressive disorder, recurrent, moderate: Secondary | ICD-10-CM | POA: Diagnosis not present

## 2023-11-12 NOTE — Progress Notes (Signed)
 [facility] Counselor/Therapist Progress Note  Patient ID: JAIDY COTTAM, MRN: [id number],    Date: 11/12/2023  Time Spent: 3:00pm-3:55pm  55 minutes   Treatment Type: Individual Therapy  Reported Symptoms: stress, loneliness  Mental Status Exam: Appearance:  Casual     Behavior: Appropriate  Motor: Normal  Speech/Language:  Normal Rate  Affect: Appropriate  Mood: normal  Thought process: normal  Thought content:   WNL  Sensory/Perceptual disturbances:   WNL  Orientation: oriented to person, place, time/date, and situation  Attention: Good  Concentration: Good  Memory: WNL  Fund of knowledge:  Good  Insight:   Good  Judgment:  Good  Impulse Control: Good   Risk Assessment: Danger to Self:  No Self-injurious Behavior: No Danger to Others: No Duty to Warn:no Physical Aggression / Violence:No  Access to Firearms a concern: No  Gang Involvement:No   Subjective: Pt present for face-to-face individual therapy via video.  Pt consents to telehealth video session and is aware of limitations and benefits of virtual sessions.  Location of pt: home Location of therapist: home office.  Pt talked about having resumed contact with Norleen, the narcissist online.   Pt states she feels obsessed with him.   Pt has not told her [name] that she is in contact with Norleen again.  Pt has a lot of irrational thoughts about the online relationship with Norleen.  Pt states she realizes her thinking is not healthy.  Pt states she feels trauma bonded to John.  She feels lonely and is addicted to attention she gets from him. Worked with pt on healthy boundary setting.    Addressed that pt needs to tell her husband what is going on.  She agreed and states she will ask [name] to keep her electronic devices so she isn't tempted to have contact with Norleen.   Plan to meet with pt and her [name] next week.  Worked on self care strategies. Provided supportive therapy.    Interventions:  Cognitive Behavioral Therapy and Insight-Oriented  Diagnosis:F33.1  Plan of Care: Recommend ongoing therapy.   Pt participated in setting treatment goals.  Plan to meet monthly.  Pt agrees with treatment plan.  Treatment Plan Client Abilities/Strengths  Pt is bright, engaging, and motivated for therapy.   Client Treatment Preferences  Individual therapy.  Client Statement of Needs  Improve coping skills.  Symptoms  Depressed or irritable mood. Diminished interest in or enjoyment of activities. Lack of energy. Poor concentration and indecisiveness. Social withdrawal.  Problems Addressed  Unipolar Depression Goals 1. Alleviate depressive symptoms and return to previous level of effective functioning. 2. Appropriately grieve the loss in order to normalize mood and to return to previously adaptive level of functioning. Objective Learn and implement behavioral strategies to overcome depression. Target Date: 2023-12-02 Frequency: monthly  Progress: 55 Modality: individual  Related Interventions Engage the client in behavioral activation, increasing his/her activity level and contact with sources of reward, while identifying processes that inhibit activation.  Use behavioral techniques such as instruction, rehearsal, role-playing, role reversal, as needed, to facilitate activity in the client's daily life; reinforce success. Assist the client in developing skills that increase the likelihood of deriving pleasure from behavioral activation (e.g., assertiveness skills, developing an exercise plan, less internal/more external focus, increased social involvement); reinforce success. Objective Identify important people in life, past and present, and describe the quality, good and poor, of those relationships. Target Date: 2023-12-02 Frequency: monthly  Progress: 55 Modality: individual  Related Interventions  Conduct Interpersonal Therapy beginning with the assessment of the client's  interpersonal inventory of important past and present relationships; develop a case formulation linking depression to grief, interpersonal role disputes, role transitions, and/or interpersonal deficits). Objective Learn and implement problem-solving and decision-making skills. Target Date: 2023-12-02 Frequency: monthly  Progress: 55 Modality: individual  Related Interventions Conduct Problem-Solving Therapy using techniques such as psychoeducation, modeling, and role-playing to teach client problem-solving skills (i.e., defining a problem specifically, generating possible solutions, evaluating the pros and cons of each solution, selecting and implementing a plan of action, evaluating the efficacy of the plan, accepting or revising the plan); role-play application of the problem-solving skill to a real life issue. Encourage in the client the development of a positive problem orientation in which problems and solving them are viewed as a natural part of life and not something to be feared, despaired, or avoided. 3. Develop healthy interpersonal relationships that lead to the alleviation and help prevent the relapse of depression. 4. Develop healthy thinking patterns and beliefs about self, others, and the world that lead to the alleviation and help prevent the relapse of depression. 5. Recognize, accept, and cope with feelings of depression. Diagnosis F33.1  Conditions For Discharge Achievement of treatment goals and objectives   [name], LCSW

## 2023-11-13 DIAGNOSIS — H43812 Vitreous degeneration, left eye: Secondary | ICD-10-CM | POA: Diagnosis not present

## 2023-11-13 DIAGNOSIS — H5213 Myopia, bilateral: Secondary | ICD-10-CM | POA: Diagnosis not present

## 2023-11-19 ENCOUNTER — Ambulatory Visit: Admitting: Psychology

## 2023-11-19 DIAGNOSIS — F331 Major depressive disorder, recurrent, moderate: Secondary | ICD-10-CM | POA: Diagnosis not present

## 2023-11-19 NOTE — Progress Notes (Signed)
 [facility] Counselor/Therapist Progress Note  Patient ID: Beverly Cole, MRN: [id number],    Date: 11/19/2023  Time Spent: 5:00pm-5:55pm  55 minutes   Treatment Type: Individual Therapy  Reported Symptoms: stress, loneliness  Mental Status Exam: Appearance:  Casual     Behavior: Appropriate  Motor: Normal  Speech/Language:  Normal Rate  Affect: Appropriate  Mood: normal  Thought process: normal  Thought content:   WNL  Sensory/Perceptual disturbances:   WNL  Orientation: oriented to person, place, time/date, and situation  Attention: Good  Concentration: Good  Memory: WNL  Fund of knowledge:  Good  Insight:   Good  Judgment:  Good  Impulse Control: Good   Risk Assessment: Danger to Self:  No Self-injurious Behavior: No Danger to Others: No Duty to Warn:no Physical Aggression / Violence:No  Access to Firearms a concern: No  Gang Involvement:No   Subjective: Pt and [name] present for face-to-face therapy via video.  Pt consents to telehealth video session and is aware of limitations and benefits of virtual sessions.  Location of pt: home Location of therapist: home office.  Pt talked about having resumed contact with Norleen, the narcissist online.  [name] is aware of pt's online activity and thinks she is communicating with a guy who does not respond to her.  [name] feels pt is brain washed by this guy but is not sure the relationship really exists.  Pt admits that she is living in a fantasy but feels out of control.   She stays up all night messaging with people online and feels she has made a lot of friends but the relationship with Garen is not healthy bc he is a narsisist.  Pt feels obsessed with Garen and feels she needs help to disconnect from him.  Pt states she was lonely and that's why she started connecting with people online but [name] states even when he is home from work pt does not engage with him bc her life revolves around the ipad. [name] thinks pt  needs to go cold [place] and remove herself from phone and ipad use.   Pt agrees with this and a plan was made for her to not have her electronic devices so she can detox from her connection with Johnny.   Pt wants to find a therapist to meet with weekly who specializes in narcisists.  Also made plan for pt to make an appointment with her psychiatrist soon to inform him about what she is experiencing.   Worked on self care strategies. Provided supportive therapy.    Interventions: Cognitive Behavioral Therapy and Insight-Oriented  Diagnosis:F33.1  Plan of Care: Recommend ongoing therapy.   Pt participated in setting treatment goals.  Plan to meet monthly.  Pt agrees with treatment plan.  Treatment Plan Client Abilities/Strengths  Pt is bright, engaging, and motivated for therapy.   Client Treatment Preferences  Individual therapy.  Client Statement of Needs  Improve coping skills.  Symptoms  Depressed or irritable mood. Diminished interest in or enjoyment of activities. Lack of energy. Poor concentration and indecisiveness. Social withdrawal.  Problems Addressed  Unipolar Depression Goals 1. Alleviate depressive symptoms and return to previous level of effective functioning. 2. Appropriately grieve the loss in order to normalize mood and to return to previously adaptive level of functioning. Objective Learn and implement behavioral strategies to overcome depression. Target Date: 2023-12-02 Frequency: monthly  Progress: 55 Modality: individual  Related Interventions Engage the client in behavioral activation, increasing his/her activity level and contact with  sources of reward, while identifying processes that inhibit activation.  Use behavioral techniques such as instruction, rehearsal, role-playing, role reversal, as needed, to facilitate activity in the client's daily life; reinforce success. Assist the client in developing skills that increase the likelihood of deriving  pleasure from behavioral activation (e.g., assertiveness skills, developing an exercise plan, less internal/more external focus, increased social involvement); reinforce success. Objective Identify important people in life, past and present, and describe the quality, good and poor, of those relationships. Target Date: 2023-12-02 Frequency: monthly  Progress: 55 Modality: individual  Related Interventions Conduct Interpersonal Therapy beginning with the assessment of the client's interpersonal inventory of important past and present relationships; develop a case formulation linking depression to grief, interpersonal role disputes, role transitions, and/or interpersonal deficits). Objective Learn and implement problem-solving and decision-making skills. Target Date: 2023-12-02 Frequency: monthly  Progress: 55 Modality: individual  Related Interventions Conduct Problem-Solving Therapy using techniques such as psychoeducation, modeling, and role-playing to teach client problem-solving skills (i.e., defining a problem specifically, generating possible solutions, evaluating the pros and cons of each solution, selecting and implementing a plan of action, evaluating the efficacy of the plan, accepting or revising the plan); role-play application of the problem-solving skill to a real life issue. Encourage in the client the development of a positive problem orientation in which problems and solving them are viewed as a natural part of life and not something to be feared, despaired, or avoided. 3. Develop healthy interpersonal relationships that lead to the alleviation and help prevent the relapse of depression. 4. Develop healthy thinking patterns and beliefs about self, others, and the world that lead to the alleviation and help prevent the relapse of depression. 5. Recognize, accept, and cope with feelings of depression. Diagnosis F33.1  Conditions For Discharge Achievement of treatment goals and  objectives   [name], LCSW

## 2023-11-20 ENCOUNTER — Telehealth: Payer: Self-pay | Admitting: Psychiatry

## 2023-11-20 NOTE — Telephone Encounter (Signed)
 Pt's husband called at 9:41a stating his wife needed an appt with Dr Geoffry earlier than her current one.  He is on the [facility].  He states pt is having side effects from a new medication he started her on about a year ago, but he doesn't know the name of it.  I told him to find out the name of it before the nurse calls back.  Next appt 11/25

## 2023-11-23 ENCOUNTER — Other Ambulatory Visit (HOSPITAL_COMMUNITY): Payer: Self-pay

## 2023-11-23 ENCOUNTER — Other Ambulatory Visit: Payer: Self-pay

## 2023-11-23 NOTE — Telephone Encounter (Signed)
 LVM to [facility]

## 2023-11-24 NOTE — Telephone Encounter (Signed)
 Left second VM to [facility]

## 2023-11-25 ENCOUNTER — Other Ambulatory Visit: Payer: Self-pay

## 2023-11-25 ENCOUNTER — Other Ambulatory Visit (HOSPITAL_COMMUNITY): Payer: Self-pay

## 2023-11-25 ENCOUNTER — Other Ambulatory Visit: Payer: Self-pay | Admitting: Family Medicine

## 2023-11-25 DIAGNOSIS — I10 Essential (primary) hypertension: Secondary | ICD-10-CM

## 2023-11-25 MED ORDER — DILTIAZEM HCL ER COATED BEADS 180 MG PO CP24
180.0000 mg | ORAL_CAPSULE | Freq: Every day | ORAL | 0 refills | Status: DC
  Filled 2023-11-25: qty 90, 90d supply, fill #0

## 2023-12-11 ENCOUNTER — Other Ambulatory Visit (HOSPITAL_COMMUNITY): Payer: Self-pay

## 2023-12-17 ENCOUNTER — Ambulatory Visit: Admitting: Psychology

## 2023-12-17 DIAGNOSIS — F331 Major depressive disorder, recurrent, moderate: Secondary | ICD-10-CM | POA: Diagnosis not present

## 2023-12-17 NOTE — Progress Notes (Signed)
 [facility] Counsel[facility] Initial Adult Exam  Name: Beverly Cole Date: 12/17/2023 MRN: [id number] DOB: October 11, 1960 PCP: Beverly Cyndee Jamee JONELLE, Beverly Cole  Time spent: 3:00pm-3:55pm     Guardian/Payee:  n/a    Paperwork requested: No   Reason for Visit /Presenting Problem: Pt and husband Beverly Cole present for face-to-face initial assessment update via video.  Pt consents to telehealth video session and is aware of limitations and benefits of virtual sessions. Location of pt: home Location of therapist: home office.  Pt talked about working on being online less.  She went offline for 4 days and was able to disconnect from the guy Beverly Cole she was involved with online.   Pt is back on Youtube during the nights but is trying not to be on for so many hours.  Beverly Cole states pt use to sleep until noon but this past year since being online all night she does not get up until 5:00 or 6:00pm.  When Beverly Cole is home from work in the evenings he feels like pt does not engage with him bc she is always on electronic devices.   Pt complains about not being happy and being lonely.  Addressed how pt's online habits are creating the disconnect in her marriage.   Pt and Beverly Cole will be going to [place]  on vacation on 11/18.   They would like for this time to be a time to reset their relationship.   Addressed how the couple can achieve this.   Beverly Cole would like to have a schedule where they spend time together during meals and site seeing.  Pt agrees that this would be good for them to Beverly Cole.     Pt continues to have issues with depression and isolation.   She needs to continue in therapy monthly. Reviewed pt's treatment plan for annual update.   Updated pt's treatment plan and IA.   Pt participated in setting treatment goals.  Plan to meet monthly.   Mental Status Exam: Appearance:   Casual     Behavior:  Appropriate  Motor:  Normal  Speech/Language:   Normal Rate  Affect:  Appropriate  Mood:  normal  Thought process:   normal  Thought content:    WNL  Sensory/Perceptual disturbances:    WNL  Orientation:  oriented to person, place, time/date, and situation  Attention:  Good  Concentration:  Good  Memory:  WNL  Fund of knowledge:   Good  Insight:    Good  Judgment:   Good  Impulse Control:  Good    Reported Symptoms:  sadness, low motivation.  Risk Assessment: Danger to Self:  No Self-injurious Behavior: No Danger to Others: No Duty to Warn:no Physical Aggression / Violence:No  Access to Firearms a concern: No  Gang Involvement:No  Patient / guardian was educated about steps to take if suicide or homicide risk level increases between visits: n/a While future psychiatric events cannot be accurately predicted, the patient does not currently require acute inpatient psychiatric care and does not currently meet [place]  involuntary commitment criteria.  Substance Abuse History: Current substance abuse: No     Past Psychiatric History:   Previous psychological history is significant for depression Outpatient Providers:pt has been in therapy throughout her life. History of Psych Hospitalization: No Psychological Testing: n/a   Abuse History:  Victim of: No., n/a   Report needed: No. Victim of Neglect:No. Perpetrator of n/a  Witness / Exposure to Domestic Violence: No   Protective Services Involvement: No  Witness  to Community Violence:  No   Family History:  Family History  Problem Relation Age of Onset   Lung cancer Mother    Hypertension Mother    Hyperlipidemia Mother    Cancer Father        Esophageal cancer   Hyperlipidemia Father    Depression Father    Cancer Brother    Pulmonary embolism Brother    Colon cancer Paternal Uncle    Sarcoidosis Other    Testicular cancer Other     Living situation: the patient lives with her spouse.  Pt grew up with both parents and older brother.  Family history of mental health issues:  depression in mother. No history of  substance abuse or abuse.   Sexual Orientation: Straight  Relationship Status: married  Name of spouse / other:Beverly Cole If a parent, number of children / ages:n/a  Support Systems: spouse  Financial Stress:  No   Income/Employment/Disability: Employment by husband.    Pt is on disability.  Military Service: No   Educational History: Education: Risk Manager: Protestant  Any cultural differences that may affect / interfere with treatment:  not applicable   Recreation/Hobbies: reading  Stressors: Health problems    Strengths: Supportive Relationships and Able to Communicate Effectively  Barriers:  none   Legal History: Pending legal issue / charges: The patient has no significant history of legal issues. History of legal issue / charges: n/a  Medical History/Surgical History: reviewed Past Medical History:  Diagnosis Date   Anxiety    Atrial fibrillation (HCC)    a. Event monitor 2013 - PACs/bradycardia/SVT/short run of atrial fib by event monitor.    BIPOLAR DISORDER UNSPECIFIED    Bradycardia    Bursitis of hip 09/1999   Bilateral - Dr. Yvone   DEPRESSION    Emphysema lung (HCC) 06/20/2016   pt states pulmonalongist stated started recently   GERD    Headache(784.0)    was with menstrual cycle. no longer a problem   HYPERLIPIDEMIA    Hypertension    Hypertension    IRRITABLE BOWEL SYNDROME, HX OF    Menopausal state 01/2012   [facility] = 88.5   Mitral valve prolapse 12/17/2000   a. dx 1980s, most recent echo did not demonstrate this.   MYALGIA    Normal coronary arteries    a. by cardiac CT 2013.   PAT (paroxysmal atrial tachycardia)    PE (pulmonary embolism) 02/09/2015   a. Bilateral PEs 01/2015 when d-dimer 0.69, CP lying on left side. Followed by heme-onc - + lupus anticoagulant, mildly depressed protein S. Dr. Timmy is not certain if her hypercoagulable studies are significant for a thrombophilic state.   Premature  atrial contractions    Pulmonary embolus (HCC) 02/10/2015   Shortness of breath    Pulmonary eval 05/2002   Squamous cell carcinoma in situ (SCCIS) 08/24/2023   Anterior K forearm - ED&C needed    Thyroid  nodule 2014   Tremors of nervous system     Past Surgical History:  Procedure Laterality Date   COLONOSCOPY     LAPAROSCOPIC APPENDECTOMY N/A 03/17/2017   Procedure: APPENDECTOMY LAPAROSCOPIC;  Surgeon: Mikell Katz, MD;  Location: [facility];  Service: General;  Laterality: N/A;   Lipoma (R) side  1990   Right back    Medications: Current Outpatient Medications  Medication Sig Dispense Refill   ALPRAZolam  (XANAX ) 0.5 MG tablet Take 1 tablet (0.5 mg total) by mouth 2 (two) times daily as needed.  30 tablet 0   aspirin  EC 325 MG tablet Take 325 mg by mouth daily.     azelastine  (ASTELIN ) 0.1 % nasal spray Place 1 spray into both nostrils 2 (two) times daily as directed 30 mL 12   dexlansoprazole  (DEXILANT ) 60 MG capsule Take 1 capsule (60 mg total) by mouth daily. 90 capsule 3   Dextromethorphan -buPROPion  ER (AUVELITY ) 45-105 MG TBCR Take 1 tablet by mouth 2 (two) times daily. (Patient taking differently: Take 1 tablet by mouth 2 (two) times daily. 1 daily) 60 tablet 3   diltiazem  (CARTIA  XT) 180 MG 24 hr capsule Take 1 capsule (180 mg total) by mouth daily. 90 capsule 0   Efinaconazole  (JUBLIA ) 10 % SOLN Apply 1 Application topically daily. 8 mL 5   fexofenadine  (ALLEGRA  ALLERGY) 180 MG tablet Take 1 tablet (180 mg total) by mouth daily.     fluticasone  (FLONASE ) 50 MCG/ACT nasal spray Place 2 sprays into the nose daily as needed. For allergies     mupirocin  ointment (BACTROBAN ) 2 % Apply 1 Application topically 2 (two) times daily  until clear 30 g 4   NONFORMULARY OR COMPOUNDED ITEM sm Azelaic acid / metronidazole /ivermectin 15%/1%/1% cream apply to face bid 1 each 5   nystatin  powder Apply 1 Application topically 3 (three) times daily. 15 g 0   pramipexole  (MIRAPEX ) 1 MG tablet  Take 1 tablet (1 mg total) by mouth every evening. 90 tablet 1   Probiotic Product (PROBIOTIC DAILY PO) Take 1 tablet by mouth daily.      propranolol  (INDERAL ) 20 MG tablet Take 1 tablet (20 mg total) by mouth 2 (two) times daily. 180 tablet 1   RABEprazole  (ACIPHEX ) 20 MG tablet Take 1 tablet (20 mg total) by mouth daily. 90 tablet 3   sertraline  (ZOLOFT ) 50 MG tablet Take 1&1/2 tablets (75 mg total) by mouth daily. 135 tablet 1   tretinoin  (RETIN-A ) 0.025 % cream Apply 1 application  topically at bedtime. 45 g 11   trimethoprim  (TRIMPEX ) 100 MG tablet Take 1 tablet (100 mg total) by mouth as directed after intercourse. 30 tablet 2   No current facility-administered medications for this visit.    Allergies  Allergen Reactions   Cariprazine Other (See Comments)    Patient becomes suicidal when taking this medication.  Patient becomes suicidal when taking this medication.    Metoprolol  Other (See Comments)    Hallucinations hair falls out Hallucinations hair falls out   Nickel Rash   Iron  Nausea And Vomiting    Diagnoses:  F33.1  Plan of Care: Recommend ongoing therapy.   Pt participated in setting treatment goals.  Plan to meet monthly.  Pt agrees with treatment plan.  Treatment Plan Client Abilities/Strengths  Pt is bright, engaging, and motivated for therapy.   Client Treatment Preferences  Individual therapy.  Client Statement of Needs  Improve coping skills.  Symptoms  Depressed or irritable mood. Diminished interest in or enjoyment of activities. Lack of energy. Poor concentration and indecisiveness. Social withdrawal.  Problems Addressed  Unipolar Depression Goals 1. Alleviate depressive symptoms and return to previous level of effective functioning. 2. Appropriately grieve the loss in order to normalize mood and to return to previously adaptive level of functioning. Objective Learn and implement behavioral strategies to overcome depression. Target Date:  2024-12-16 Frequency: monthly  Progress: 55 Modality: individual  Related Interventions Engage the client in behavioral activation, increasing his/her activity level and contact with sources of reward, while identifying processes that inhibit activation.  Use behavioral techniques such as instruction, rehearsal, role-playing, role reversal, as needed, to facilitate activity in the client's daily life; reinforce success. Assist the client in developing skills that increase the likelihood of deriving pleasure from behavioral activation (e.g., assertiveness skills, developing an exercise plan, less internal/more external focus, increased social involvement); reinforce success. Objective Identify important people in life, past and present, and describe the quality, good and poor, of those relationships. Target Date: 2024-12-16 Frequency: monthly  Progress: 55 Modality: individual  Related Interventions Conduct Interpersonal Therapy beginning with the assessment of the client's interpersonal inventory of important past and present relationships; develop a case formulation linking depression to grief, interpersonal role disputes, role transitions, and/or interpersonal deficits). Objective Learn and implement problem-solving and decision-making skills. Target Date: 2024-12-16 Frequency: monthly  Progress: 55 Modality: individual  Related Interventions Conduct Problem-Solving Therapy using techniques such as psychoeducation, modeling, and role-playing to teach client problem-solving skills (i.e., defining a problem specifically, generating possible solutions, evaluating the pros and cons of each solution, selecting and implementing a plan of action, evaluating the efficacy of the plan, accepting or revising the plan); role-play application of the problem-solving skill to a real life issue. Encourage in the client the development of a positive problem orientation in which problems and solving them are  viewed as a natural part of life and not something to be feared, despaired, or avoided. 3. Develop healthy interpersonal relationships that lead to the alleviation and help prevent the relapse of depression. 4. Develop healthy thinking patterns and beliefs about self, others, and the world that lead to the alleviation and help prevent the relapse of depression. 5. Recognize, accept, and cope with feelings of depression. Diagnosis F33.1  Conditions For Discharge Achievement of treatment goals and objectives   [name], LCSW

## 2023-12-24 ENCOUNTER — Other Ambulatory Visit: Payer: Self-pay

## 2023-12-24 ENCOUNTER — Other Ambulatory Visit: Payer: Self-pay | Admitting: Dermatology

## 2023-12-24 ENCOUNTER — Other Ambulatory Visit (HOSPITAL_COMMUNITY): Payer: Self-pay

## 2023-12-25 ENCOUNTER — Other Ambulatory Visit (HOSPITAL_COMMUNITY): Payer: Self-pay

## 2023-12-25 ENCOUNTER — Encounter (HOSPITAL_COMMUNITY): Payer: Self-pay

## 2024-01-05 ENCOUNTER — Telehealth: Admitting: Psychiatry

## 2024-01-14 ENCOUNTER — Ambulatory Visit: Admitting: Psychology

## 2024-01-24 ENCOUNTER — Other Ambulatory Visit: Payer: Self-pay | Admitting: Psychiatry

## 2024-01-24 ENCOUNTER — Other Ambulatory Visit: Payer: Self-pay | Admitting: Family Medicine

## 2024-01-24 DIAGNOSIS — I10 Essential (primary) hypertension: Secondary | ICD-10-CM

## 2024-01-24 DIAGNOSIS — F411 Generalized anxiety disorder: Secondary | ICD-10-CM

## 2024-01-24 DIAGNOSIS — F4001 Agoraphobia with panic disorder: Secondary | ICD-10-CM

## 2024-01-24 DIAGNOSIS — F331 Major depressive disorder, recurrent, moderate: Secondary | ICD-10-CM

## 2024-01-25 ENCOUNTER — Other Ambulatory Visit (HOSPITAL_COMMUNITY): Payer: Self-pay

## 2024-01-25 ENCOUNTER — Other Ambulatory Visit: Payer: Self-pay

## 2024-01-25 MED ORDER — DILTIAZEM HCL ER COATED BEADS 180 MG PO CP24
180.0000 mg | ORAL_CAPSULE | Freq: Every day | ORAL | 0 refills | Status: AC
  Filled 2024-01-25 – 2024-02-19 (×2): qty 90, 90d supply, fill #0

## 2024-01-25 MED ORDER — SERTRALINE HCL 50 MG PO TABS
75.0000 mg | ORAL_TABLET | Freq: Every day | ORAL | 0 refills | Status: AC
  Filled 2024-01-25: qty 135, 90d supply, fill #0

## 2024-01-27 ENCOUNTER — Other Ambulatory Visit (HOSPITAL_COMMUNITY): Payer: Self-pay

## 2024-02-01 ENCOUNTER — Other Ambulatory Visit (HOSPITAL_COMMUNITY): Payer: Self-pay

## 2024-02-02 ENCOUNTER — Telehealth: Admitting: Psychiatry

## 2024-02-02 DIAGNOSIS — F411 Generalized anxiety disorder: Secondary | ICD-10-CM | POA: Diagnosis not present

## 2024-02-02 DIAGNOSIS — D500 Iron deficiency anemia secondary to blood loss (chronic): Secondary | ICD-10-CM | POA: Diagnosis not present

## 2024-02-02 DIAGNOSIS — G250 Essential tremor: Secondary | ICD-10-CM | POA: Diagnosis not present

## 2024-02-02 DIAGNOSIS — G2581 Restless legs syndrome: Secondary | ICD-10-CM | POA: Diagnosis not present

## 2024-02-02 DIAGNOSIS — F4001 Agoraphobia with panic disorder: Secondary | ICD-10-CM | POA: Diagnosis not present

## 2024-02-02 DIAGNOSIS — F4000 Agoraphobia, unspecified: Secondary | ICD-10-CM

## 2024-02-02 DIAGNOSIS — F40240 Claustrophobia: Secondary | ICD-10-CM | POA: Diagnosis not present

## 2024-02-02 DIAGNOSIS — F331 Major depressive disorder, recurrent, moderate: Secondary | ICD-10-CM

## 2024-02-02 NOTE — Progress Notes (Signed)
 Beverly Cole [id number] [birth year] 63 y.o.  Video Visit via My Chart  I connected with pt by video using My Chart and verified that I am speaking with the correct person using two identifiers.   I discussed the limitations, risks, security and privacy concerns of performing an evaluation and management service by My Chart  and the availability of in person appointments. I also discussed with the patient that there may be a patient responsible charge related to this service. The patient expressed understanding and agreed to proceed.  I discussed the assessment and treatment plan with the patient. The patient was provided an opportunity to ask questions and all were answered. The patient agreed with the plan and demonstrated an understanding of the instructions.   The patient was advised to call back or seek an in-person evaluation if the symptoms worsen or if the condition fails to improve as anticipated.  I provided 30 minutes of video time during this encounter.  The patient was located at home and the provider was located office. Session [id number]  Subjective:   Patient ID:  Beverly Cole is a 63 y.o. (DOB 1960-07-22) female.  Chief Complaint:  Chief Complaint  Patient presents with   Follow-up   Depression   Anxiety   Stress    Beverly Cole  today for follow-up of chronic depression and anxiety.  Lately depression worse than anxiety.  Seen with husband today  When seen May 11, 2018.  For persistent anxiety we elected to retry buspirone  and try increasing it to 30 mg twice daily if tolerated.   Couldn't tolerate it this time DT muscle spasms.  Pharmacy called saying she could have serotonin syndrome.  When seen August 09, 2018 and she refused med changes despite chronic anxiety and depression. She remained on sertraline  50, Depakote  ER 1000 mg, Wellbutrin  SR 200 mg AM  seen December 09, 2018.  Because of chronic depression and dysfunction with fatigue and poor motivation we  decided off label trial  trial of modafinil  200 mg 1/2 each am for 1 week then 200 mg each AM. Did see benefit from it.  More energy and working to stay out of bed more.    seen January 10, 2019.  The modafinil  had been helpful.  No meds were changed.  seen April 07, 2019.  The following was noted: Still sees benefit from modafinil  but still not caring for herself like she should.  Not wanting to go anywhere.  Not driven since early fall.  Hard to breathe with the mask and it creates anxiety.  Hard to be in enclosed spaces like cars and planes for extended periods.   Trying to set her alarm clock.  No hallucinations.  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  Plans to send more too.  Still follow through is not great.  Still depression and productivity is still poor but it is not as poor as it was before modafinil . Plan increase modafinil  to 300 mg daily to see if function can be improved.  06/29/2019 appointment, the following is noted: Rare Xanax .  Did increase modafinil  to 300 mg daily. Saw benefit with the increase with less time in bed but still markedly functionally impaired.   It seems like I get used to it and then doesn't maintain her get up and go.  Still having a hard time with self care like hygiene. Chronic low motivation and  lack of interest is unchanged.   Gall bladder problems.  It's better at the moment.    Not more sad, just like I've always been.  Can't make herself leave the house DT depression and anxiety.  She won't do chores.   Less excess sleeping.  He works from home 2 days weekly.  No periods of hyperactivity or manic sx since the spring. Attending therapy every 2 weeks.   Can be confused when first wakes up regardless of time of day. Had episode of hallucination in the middle of the night when awakened. Scarecrow frightened her. This is rare event.  No hallucinations during the day.  Melatonin makes  it worse. Has had fearful thoughts of planes crashing or bad things happening to others and it might be her fault.  Fight with brother lately and not speaking with him now. Plan:  No med changes  10/11/19 appt with the following noted: Unsteady and tremors gotten worse.  Not gone to doctor. Propranolol  not helping as much.   On Depakote  ER 1000mg  HS which is a reduction. So tired all the time and H says she's not doing anything.  H says accomplishing littte and still lays in bed a lot.  H says it's 2-3 PM before she gets OOB.  Memory is poor.  Everything is such a chore and doesn't want to do things. H says she starts things she doesn't finish.  Lots of new projects.   Increase modafinil  on her own to 400 mg daily in the AM.  Anxious.  Rare Xanax .  Was happy when got new cats in the summer.  Anxious and Depressed and describes anxiety as Moderate. Anxiety symptoms include: Excessive Worry,.  She thinks sertraline  reduces her obsessiveness.  Past hx panic so avoidant. Never get to relax.  Pt reports sleeps excessively even with modafinil . Pt reports that appetite is good. Pt reports that energy is poor and loss of interest or pleasure in usual activities, poor motivation and withdrawn from usual activities. Just don't care about things and admits to a lot of general anxiety and everything takes a lot of energy out of her.  Compulsively tapes and watches TV shows, news and awards shows.  Can't delete it bc I'll miss something.  Watches TV in bed. Concentration is poor. Suicidal thoughts:  denied by patient. But chronic death thoughts.  Poor productivity overall.  Chronic poor self care, showers only weekly.  Just don't care.  Chronic disability.  She thinks sertraline  helped the anxiety some.  Likes that H is at home.  Helps her mood.  Doesn't care about going out. Feels faint when she tries to wear a mask.  Too anxious driving and is avoidant. Collects TV shows, she can't erase unless she watches  entirely. Hoards some bc afraid she might need it some day.   Rare use of Xanax  bc fears addiction. Plan: Attempted referral to neurology at [facility] neurology.  They refused to see patient stating they had nothing to offer her. It was suggested to patient that she try to get in with Dr. Jenel whom she had seen in the past.  01/10/2020 appointment with the following noted: Reduced modafinil  to 300 mg bc didn't want to get hooked on anything about 2-3 weeks.  Did initially see benefit from the modafinil  but seemed to get tolerant to it. Feels better not talking to brother bc differing views of politics.  Feels he's a negative person but never depressed. Overall thinks she's doing pretty well  relatively.  However is chronically depressed and never happy.  Nothing changed with the meds. Depression worse than anxiety. H didn't think modafinil  made much difference.  H CO her inactivity.. She felt it was helpful for energy initially. Had to cancel trips bc can't wear a mask for that long.  Makes her sad. Driven twice in a year or so DT anxiety. Wants prn Xanax . Plan: DC sertraline  Start fluoxetine  20 mg daily with olanzapine  5 mg daily.  05/22/2020 appointment with following noted: Didn't remember to stop sertraline  and took fluoxetine  and olanzapine  for 2 days and couldn't eat and stopped it. Then went to dermatologist December who told her couldn't take Zoloft  with fungus med and stopped sertraline . Then became more depressed.  I have to go back on the Zoloft . Remained on modafinil , Wellbutrin , Depakote .  Even less motivated to do anything like cook, clean.  Won't go to [employer] etc. Chronic depression with hopelessness to get better Plan: No med changes  07/31/2020 appointment with the following noted: Back on sertraline  at 75 mg daily with Remained on modafinil , Wellbutrin , Depakote .  Aunt died and one of worst fears but not thinking about it. Family got Covid but she recovered.  She thought  it might kill and didn't care. Tremors are getting worse and balance problems. Went to concert and that was a problem.  Enjoyed the concert. Also saw Kiss, Elton John.  Music always important to her.  1980 had planned to commit suicide listening to an album over and over but didn't attempt. B and M have familial tremor also. Still anxious and depressed and haven't driven in a year DT anxiety. Plan: Option of trial olanzapine  alone without fluoxetine .  Rec retry.  She agrees.  10/23/2020 appointment with the following noted: Olanzapine  is really working.  Initial SE drunk at 5 mg HS so cut it in half.  Got used to it but I feel happy and more relaxed and easier time going to sleep.  If I could still chose to die I would but clearly happier but not more hopeful.  Laughing more.  No SI. Needs to care about things more.  Chronic lack of motivations. Still doesn't want to leave the house but did enjoy a concert in [place]. Chronic unsteadiness is worse.  Last disc with doctor in March who rec PT but she never did the PT.  Misses aunt who died.   She reduced the Depakote  to 500 mg HS. When stopped sertraline  was not doing well and restarted it bc stressed and unhappy.  Seems to be a key med for me. Pending card test soon.  Feels better than in the past. RLS is worse after starting olanzapine  and even affecting arms but had it before.  Happens after lays down at night.  Takes MG which helps.   Xanax  about once every 3 weeks. Plan: Successful trial olanzapine  2.5 mg with sertraline  50 but worse RLS Trial gabapentin  for RLS 100-300 mg PM and disc SE.  Higher if needed..  Once controlled consider increase olanzapine .  01/23/2021 appointment with the following noted: Taking gabapentin  300 mg but needs to go higher for RLS.  SE dizzy and reduced concentration. Reads on internet.   Increased olanzapine  to 5 mg HS a couple of mos ago and feels better with it.  Happier.  More relaxed.  Only think that's  helped in a long time. Handwriting is so much better, less shakey. Has RLS and can be bad when she lays down. Got a  new kitten and enjoys it.  Now has 4 cats. Afraid to try new meds after negative reaction to Vraylar.  Afraid of any med change.  Afraid of having SI and afraid she'd do it if had SI.   Saw pulmonologist over weakness and he says she's OK except deconditioned.  Can't tolerate wearing the masks. Plan: Successful trial olanzapine  5 mg with sertraline  50 for depression and anxiety but worse RLS Consider increasing the olanzapine  later if RLS can be managed. increase gabapentin  for RLS 400-600 mg PM and disc SE.  Higher if needed..  Once controlled consider increase olanzapine .  03/28/2021 appointment with the following noted: Uses Ivory soap behind knee with benefit.  Increased gabapentin  to 600 mg PM.  Dizziness resolved which occurred when turned over in bed.  Not a problem. RLS still 5/7 days.  But had no vacation RLS bc was walking more.  Not active at home. Sleep is great 8-9 hours and best in a long time. Enjoyed expensive vacation to [facility].  Once in a lifetime thing.  Some things she didn't like for example the cost. Went to Journey concert and others. Depression is better.  Olanzapine  really helped.  Didn't expect it.   Hasn't felt this good in years and H notices.   Caffeine 2 cups hot tea and limited. Plan: Successful trial olanzapine  5 mg with sertraline  50 for depression and anxiety but worse RLS Consider increasing the olanzapine  later if RLS can be managed. increase gabapentin  for ok to increase RLS 600-900 mg PM and disc SE.  Higher if needed..  Once controlled consider increase olanzapine .  05/27/2021 phone call wanting to increase olanzapine  to see if it will further help depression.  She had increased it on her own to 10 mg for a couple of weeks.  She has to increase to 15 mg.  This was agreed  06/26/21 appt noted: On sertraline  50 and olanzapine  15 mg HS with  1200 mg PM gabapentin  for RLS RLS kicks in about 4 hours after olanzapine .  Prefers to stay up late bc doesn't really want to go to sleep.  RLS 5 days per week. Sleep 8 hours. Caffeine 2 cups hot tea and limited. Benefit from increase olanzapine . Helped anxiety and able to go to sleep earlier than usual.   Before trip was happier than I've been in years.  Not real happy with trip and now nothing to look forward to.   SE some dizziness with gabapentin .  But it's better.   Mood still better than before olanzapine . Plan: benefit olanzapine  15 mg with sertraline  50 for depression and anxiety but worse RLS She wants to Consider increasing the olanzapine  later if RLS can be managed. OK increase olanzapine  to 20 mg PM If RLS is worse then will have to reduce.  08/26/2021 appointment with the following noted: Increased olanzapine  20 mg HS.  Not sure if it made a difference.  Terrible time with memorfy ongoing. Sleep good.  Still has RLS and can get pain in her arm including yesterday at 6-7 PM.   Asks about Ozempic for OCD. Depends on H [name]. Anxiety is better and that helps sleep.  Depression better than it was.  No longer thinks of death all the time like she use to.  Still not very active.  Need something to look forward to. Stays up late and likes doing so bc less depressed in evening.   Plan continue olanzapine  20 mg daily with sertraline  75 mg daily  11/26/2021 appointment noted:  seen with VE[facility] [place] Memory is poor.  Forgets TV shows. Taking alprazolam  rarely. Overall thinks she is doing fine.  Still not driving much.  Don't like to leave the house but went outside on the deck some which is improvement. Sleeps 12 hours nightly but then up all day. Happier with olanazapine and thinks the higher doses did help the anxiety. H doesn't see any changes in the last year.  Covid messed her up and she has become housebound.  Not social.  She got used to being inside and no motivation to change it. H  cleans and prepares meals.  Doesn't have motivation for chores. Plan: continue sertraline  50 for depression and anxiety  She wants to Consider increasing the olanzapine  later if RLS can be managed. Reduce olanzapine  to 10 mg HS bc unclear if higher dose helped more than lower. RLS is better with MG Modafinil  300 to 400 mg daily for alertness and off label for treatment resistant depression Trial Auvelity  for TRD 1 in AM for 1 week, then 1 twice daily. DC Wellbutriin  12/25/2021 phone call requesting 90-day prescription of Auvelity  stating that it was working well.  02/19/22 appt noted: Current psych med: rare Xanax , Auvelity  BID, no fluoxetine , modafinil  300 mg AM , olanzapine  10 pm, propranolol  20 BID, sertraline  75 mg daily. Gabapentin  1200 mg daily I feel like doing things more and I see things more clearly.  Realizes now buying things to make myself feel happier and not really a buying problem per se.  Now not driven to buy things she shouldn't.   Feels embarrassed by what she did before with this.  Had bought a lot of expensive baskets and now no longer does that.  She is happier she's better and H notices it too.  Putting things away and better cleaning.  Tired a lot.  Better motivation..  had lunch with friends scheduled for Friday but is cancelling.  Not sure whay she is avoidant socially.  Anxious about it.  Almost fears a panic attack.  Not sure when had last panic but had them when she tried to go to work.  I can't.  Doesn't drive often.  Has a fleeta but doesn't like driving it.  Can't drive at night.   Still sleeps 12 hours in 24.   Happy the way I am right now. Some trouble with restless legs. Plan: no med changes Less depressed with Auvelity  for TRD BID markedly  04/23/22 appt noted: Haven't felt this good in years. Consistent with Auvelity  BID.   Current psych meds: modafinil  300 Am, Auvelity  BID, sertraline  75, olanzapine  10 mg HS, propranolol  20 BID. Asks about going off  olanzapine . 2 cats put to sleep and she did well with it despite in past had SI when doing it. Still sleeps 12 hour daily bc so tired and wants to try stopping it. No RLS on gabapentin .  To [place]  next month.   Plan: continue sertraline  75 for depression and anxiety  Less depressed with Auvelity  for TRD BID markedly, best in years. Ok trial reduction to olanzapine  to 5  mg HS .  After 30 D if well can stop it. RLS is better with MG  06/24/22 appt noted: Not often with alprazolam  other than sleep if needed.   Psych med: Auvelity  BID , gabapentin  1200 pm for back pain, modafinil  300 AM, olanzapine  5 mg HS, sertraline  75 mg daily, propranolol  20 BID, alprazolam  0.5 mg prn When reduced olanzapine  from  10 to 5 then not as happy. But is less sleepy and tired and wants to stay on the lower dose for now. Overall still doing good and very good.  Hurt back packing for [place]  and still some back problems.  Went to [place] .  Had a good time there.  Bad weather cancelled the snorkeling trip.   Overall satisfied with current problems.  Plan no med changes  11/13/22 appt noted: Psych med: Auvelity  BID, gabapentin  600 pm for back pain & RLS, modafinil  300 AM, olanzapine  5 mg HS, sertraline  75 mg daily, propranolol  20 BID, alprazolam  0.5 mg prn Stress cousin bone CA really upsetting.  B needing neck surgery.  H prostate bx pending. CBD oil in drink helps her anxiety.  Just relaxes her and doesn't drive after  it or take it excessively.   Memory is not good.  Can forget what she says in conversation.  Took memory test at PCP and it was normal.  This is not new.   She feels she has maintained the benefit from Auvelity .  Now I don't want to die which is unbelievable bc has wanted to die for 50 years.  Now just starting life again.  But now is worrying about dying.  Both parents had CA and B had CA. Cont problems with RLS.  Will occur in arms as well.  Reversed sleep schedule.  Stay up all night.  Sleep 8-10  hours in day.   Still using caffeine. Plan no changes  01/20/23 appt noted: Meds as above except Reduced gabapentin  to 900 mg pm bc 1200 mg made her sleep 12 hours. Ongoing problems with RLS and not controlled.  It is at night when lays down in legs and arms when it is bad.  RLS for years.   Doubt gabapentin  is helping back pain.  No other SE. Mood is good.  Maintained benefit. Anxiety is under control for her. Problems going to sleep.  Once asleep sleeps 9 hours.   Plan: Start pramipexole  1/2 tablet 90 min before sleep for 1 week then if RLS is not better then increase to 1 tablet in evening Reduce olanzapine  to 1/2 of 5 mg tablet to reduce risk RLS.   Disc risk mania.  Call if develop an of those sx as these changes could trigger mania but RLS is unmanageable at this time. Wean gabapentin  to 600 mg for 1 week then reduce to 1/2 tablet nightly for 1 week and then stop it DT poor resp for RLS and back pain  03/24/23 appt noted: Psych meds: Alprazolam  0.5 twice daily as needed anxiety, Auvelity  twice daily, not taking modafinil  300 every morning, pramipexole  1/2 mg in the evening, propranolol  20 twice daily, sertraline  75  .  Stopped olanzapine  and gabapentin .  Had to stop modafinil  bc couldn't sleep when stopped olanzapine .  Had hot and cold sensations for a month after stopping.  Gone now.  Didn't sleep after stopped both olanzapine  and gabapentin .   Lost wt  5# and diet is better.  Drinking shake.  Not gaining wt.     Current UTI.  Feels really weak.  Low HCT.  High platelets. Not much px with RLS now. Pretty good. I didn't realize how much a zombie I was before when on olanzapine . Mentally really good.  Physically not good.   Sleep is better now but choppy.   Used to sleep 12 hour/d and now 7-8 hour/d.  H says she seems a lot happier and more motivated.  Allergic to taking iron  pills.  Eats red meat.   Riding cycle.   No mood px stopping olanzapine .    06/23/23 appt noted: Med:  Alprazolam  0.5 twice daily as needed anxiety, Auvelity  twice daily, pramipexole  1 mg in the evening, propranolol  20 twice daily, sertraline  75  . Low ferritin and getting infusions twice.  So weak and tired and can't do much.  Vertigo returned but getting better.  CC weakness. So far infusions haven't helped. Enjoyed [place] at [employer]. H [name] promotion at work.  Working a lot of hours.  Plans to work until 63 yo.   Dep a little worse over fatigue and iron  deficiency.  Certainly not as bad as in the past.  Need to hire someone to clean house.  Discovered youtube in November and enjoys the music there.   Don't want to take  the Xanax .  Very rare alcohol.   Plan no med changes.   For RLS, rec continue iron .  She can't take iron  supplements per pt.  Disc this in detail.  She started iron  infusions.    07/15/23 TC:  Pt is currently on Auvelity . She is asking to decrease dose to once a day. She is reporting increased libido on BID dosing. She had the same issue when she was on Wellbutrin  alone. It looks like she has been on Auvelity  for more than a year.  MD resp:  Ok to reduce to 1 in the morning.  Let us  know if she gets more depressed.    Lorene Macintosh, MD, DFAPA      10/06/23 appt noted:  Med: Alprazolam  0.5 twice daily as needed anxiety, Auvelity  once daily, pramipexole  1 mg in the evening, propranolol  20 twice daily, sertraline  75  . Initially more depressed with less Auvelity  to one daily so then reduced it more gradually.  Had a lot of things going on.  Recent incr Libido suddenly with Auvelity .  It is manageable. Had skin CA removed.  Yesterday MRI for H's prostate CA.   Saw eye doctor with beginnings of retinal detachment addressed.   Taking a trauma class for 8 mos.  One session.  Happy with it so far.   I didn't know I had these problems until I went on YouTube.  The reason I think the way I do is bc of my childhood.    Got involved with guy in [place]  who sucked her in and was ready to move  to [place].  I was crazy.  That seems to be ending.  Going on since April.  Felt like she got sucked into a cult.  Hard to explain.  I would've done anything for him.  I wanted him to be happy rather than me to be happy.  Got involved with his friend too.  Recognized his narcissism.  Led to some loss of YouTube friends over it but is ok with it.    He's only 38 yo. H [name] knows it now.  Totally done with it.  Happier now than in a long time.  Was a good lessen.    Does worry over whether he is hacking her phone and computer.  [name] promoted at work and so busy, so the guy giving her attention got her sucked in.  Better with H now.  Some trouble with sleeping partly bc stays up late on YouTube watching a guitar guy.  I herself on youtube.  Denies other manic sx.  Used to be obsessed with individual guys in  her 17s.   Feels ok with meds the way she is now.  But thinks she might have to go down in Auvelity  again bc feels libido will get worse again.    02/02/24 appt noted: Med: Alprazolam  0.5 twice daily as needed anxiety, Auvelity  once daily, pramipexole  1 mg in the evening, propranolol  20 twice daily, sertraline  75  . She reduced Auvelity  to every other day bc of severe SE of my sex drive is crazy. It has led to some px which didn't exist before the Auvelity .   Issues with online relationship is consuming.   Never met him in person.  H knows about him.  She wants to leave H for him.   Has counselor.  Not helpful.  Has appt Jan 9 with another therapist.   Past psych med trials:  Wellbutrin  XL 300 incr libido, Sertraline  75, fluoxetine  SE brief ,  Auvelity  marked positive response  Vraylar SI, Latuda, Abilify , olanzapine  20 Symbyax side effects Seroquel  which caused side effects of constipation,  Depakote  ER 1500mg  HS  lamotrigine,  refuses lithium  because of altered taste.    Pramipexole  1 mg in 2019? 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   buspirone  SE hallucinations Gabapentin  for RLS  ECT 2016 without help,   Review of Systems:  Review of Systems  Constitutional:  Positive for fatigue.  Respiratory:  Positive for shortness of breath.   Cardiovascular:  Negative for chest pain.  Gastrointestinal:  Negative for abdominal pain and vomiting.  Musculoskeletal:  Positive for gait problem.  Neurological:  Positive for dizziness and tremors. Negative for weakness.       RLS  Psychiatric/Behavioral:  Positive for decreased concentration. Negative for agitation, behavioral problems, confusion, dysphoric mood, hallucinations, self-injury, sleep disturbance and suicidal ideas. The patient is nervous/anxious. The patient is not hyperactive.     Medications: I have reviewed the patient's current medications.  Current Outpatient Medications  Medication Sig Dispense Refill   ALPRAZolam  (XANAX ) 0.5 MG tablet Take 1 tablet (0.5 mg total) by mouth 2 (two) times daily as needed. 30 tablet 0   aspirin  EC 325 MG tablet Take 325 mg by mouth daily.     azelastine  (ASTELIN ) 0.1 % nasal spray Place 1 spray into both nostrils 2 (two) times daily as directed 30 mL 12   diltiazem  (CARTIA  XT) 180 MG 24 hr capsule Take 1 capsule (180 mg total) by mouth daily. 90 capsule 0   Efinaconazole  (JUBLIA ) 10 % SOLN Apply 1 Application topically daily. 8 mL 5   fexofenadine  (ALLEGRA  ALLERGY) 180 MG tablet Take 1 tablet (180 mg total) by mouth daily.     fluticasone  (FLONASE ) 50 MCG/ACT nasal spray Place 2 sprays into the nose daily as needed. For allergies     mupirocin  ointment (BACTROBAN ) 2 % Apply 1 Application topically 2 (two) times daily  until clear 30 g 4   NONFORMULARY OR COMPOUNDED ITEM sm Azelaic acid / metronidazole /ivermectin 15%/1%/1% cream apply to face bid 1 each 5   nystatin  powder Apply 1 Application topically 3 (three) times daily. 15 g 0   pramipexole  (MIRAPEX ) 1 MG tablet Take 1 tablet (1  mg total) by mouth every evening. 90 tablet 1   Probiotic Product (PROBIOTIC DAILY PO) Take 1 tablet by mouth daily.      propranolol  (INDERAL ) 20 MG tablet Take 1 tablet (20 mg total) by  mouth 2 (two) times daily. 180 tablet 1   RABEprazole  (ACIPHEX ) 20 MG tablet Take 1 tablet (20 mg total) by mouth daily. 90 tablet 3   sertraline  (ZOLOFT ) 50 MG tablet Take 1&1/2 tablets (75 mg total) by mouth daily. 135 tablet 0   tretinoin  (RETIN-A ) 0.025 % cream Apply 1 application  topically at bedtime. 45 g 11   trimethoprim  (TRIMPEX ) 100 MG tablet Take 1 tablet (100 mg total) by mouth as directed after intercourse. 30 tablet 2   No current facility-administered medications for this visit.    Medication Side Effects: None  Allergies:  Allergies  Allergen Reactions   Cariprazine Other (See Comments)    Patient becomes suicidal when taking this medication.  Patient becomes suicidal when taking this medication.    Metoprolol  Other (See Comments)    Hallucinations hair falls out Hallucinations hair falls out   Nickel Rash   Iron  Nausea And Vomiting    Past Medical History:  Diagnosis Date   Anxiety    Atrial fibrillation (HCC)    a. Event monitor 2013 - PACs/bradycardia/SVT/short run of atrial fib by event monitor.    BIPOLAR DISORDER UNSPECIFIED    Bradycardia    Bursitis of hip 09/1999   Bilateral - Dr. Yvone   DEPRESSION    Emphysema lung (HCC) 06/20/2016   pt states pulmonalongist stated started recently   GERD    Headache(784.0)    was with menstrual cycle. no longer a problem   HYPERLIPIDEMIA    Hypertension    Hypertension    IRRITABLE BOWEL SYNDROME, HX OF    Menopausal state 01/2012   [facility] = 88.5   Mitral valve prolapse 12/17/2000   a. dx 1980s, most recent echo did not demonstrate this.   MYALGIA    Normal coronary arteries    a. by cardiac CT 2013.   PAT (paroxysmal atrial tachycardia)    PE (pulmonary embolism) 02/09/2015   a. Bilateral PEs 01/2015 when d-dimer  0.69, CP lying on left side. Followed by heme-onc - + lupus anticoagulant, mildly depressed protein S. Dr. Timmy is not certain if her hypercoagulable studies are significant for a thrombophilic state.   Premature atrial contractions    Pulmonary embolus (HCC) 02/10/2015   Shortness of breath    Pulmonary eval 05/2002   Squamous cell carcinoma in situ (SCCIS) 08/24/2023   Anterior K forearm - ED&C needed    Thyroid  nodule 2014   Tremors of nervous system     Family History  Problem Relation Age of Onset   Lung cancer Mother    Hypertension Mother    Hyperlipidemia Mother    Cancer Father        Esophageal cancer   Hyperlipidemia Father    Depression Father    Cancer Brother    Pulmonary embolism Brother    Colon cancer Paternal Uncle    Sarcoidosis Other    Testicular cancer Other     Social History   Socioeconomic History   Marital status: Married    Spouse name: Not on file   Number of children: 0   Years of education: Not on file   Highest education level: Associate degree: academic program  Occupational History   Occupation: Nurse-Personal Care/HH    Employer: UNEMPLOYED  Tobacco Use   Smoking status: Former    Current packs/day: 0.00    Average packs/day: 1 pack/day for 25.0 years (25.0 ttl pk-yrs)    Types: Cigarettes    Start date: 02/11/1971  Quit date: 02/11/1996    Years since quitting: 28.0   Smokeless tobacco: Never   Tobacco comments:    Married, lives with spouse. 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       No exercise   Social Drivers of Health   Tobacco Use: Medium Risk (02/08/2024)   Patient History    Smoking Tobacco  Use: Former    Smokeless Tobacco Use: Never    Passive Exposure: Not on file  Financial Resource Strain: Low Risk (08/11/2023)   Overall Financial Resource Strain (CARDIA)    Difficulty of Paying Living Expenses: Not hard at all  Food Insecurity: No Food Insecurity (08/11/2023)   Epic    Worried About Programme Researcher, Broadcasting/film/video in the Last Year: Never true    Ran Out of Food in the Last Year: Never true  Transportation Needs: No Transportation Needs (08/11/2023)   Epic    Lack of Transportation (Medical): No    Lack of Transportation (Non-Medical): No  Physical Activity: Inactive (08/11/2023)   Exercise Vital Sign    Days of Exercise per Week: 0 days    Minutes of Exercise per Session: Not on file  Stress: Stress Concern Present (08/11/2023)   [employer] of Occupational Health - Occupational Stress Questionnaire    Feeling of Stress: Very much  Social Connections: Socially Isolated (08/11/2023)   Social Connection and Isolation Panel    Frequency of Communication with Friends and Family: Once a week    Frequency of Social Gatherings with Friends and Family: Never    Attends Religious Services: Never    Database Administrator or Organizations: No    Attends Engineer, Structural: Not on file    Marital Status: Married  Catering Manager Violence: Not on file  Depression (PHQ2-9): High Risk (04/29/2022)   Depression (PHQ2-9)    PHQ-2 Score: 16  Alcohol Screen: Low Risk (08/11/2023)   Alcohol Screen    Last Alcohol Screening Score (AUDIT): 3  Housing: Low Risk (08/11/2023)   Epic    Unable to Pay for Housing in the Last Year: No    Number of Times Moved in the Last Year: 0    Homeless in the Last Year: No  Utilities: Not on file  Health Literacy: Not on file    Past Medical History, Surgical history, Social history, and Family history were reviewed and updated as appropriate.   Please see review of systems for further details on the patient's review from today.   Objective:    Physical Exam:  LMP 05/11/2012   Physical Exam Neurological:     Mental Status: She is alert and oriented to person, place, and time.     Cranial Nerves: No dysarthria.  Psychiatric:        Attention and Perception: Attention and perception normal.        Mood and Affect: Mood is anxious. Mood is not depressed. Affect is not tearful.        Speech: Speech normal.        Behavior: Behavior is cooperative.  Thought Content: Thought content normal. Thought content is not paranoid or delusional. Thought content does not include homicidal or suicidal ideation. Thought content does not include suicidal plan.        Cognition and Memory: Cognition and memory normal.     Comments: Insight and judgment fair and less impulsive Chronic anxiety and avoidant Best mood in years.  But some crisis issues Still better with meds. Good humor.       Lab Review:     Component Value Date/Time   NA 141 04/30/2023 1540   NA 145 (H) 04/05/2020 1508   NA 142 01/21/2017 1013   NA 143 01/16/2016 1005   K 4.3 04/30/2023 1540   K 3.2 (L) 01/21/2017 1013   K 3.3 (L) 01/16/2016 1005   CL 104 04/30/2023 1540   CL 106 01/21/2017 1013   CO2 26 04/30/2023 1540   CO2 29 01/21/2017 1013   CO2 20 (L) 01/16/2016 1005   GLUCOSE 100 (H) 04/30/2023 1540   GLUCOSE 95 01/21/2017 1013   BUN 15 04/30/2023 1540   BUN 14 04/05/2020 1508   BUN 9 01/21/2017 1013   BUN 12.6 01/16/2016 1005   CREATININE 0.87 04/30/2023 1540   CREATININE 0.94 12/20/2019 1449   CREATININE 0.9 01/21/2017 1013   CREATININE 0.9 01/16/2016 1005   CALCIUM  9.9 04/30/2023 1540   CALCIUM  9.2 01/21/2017 1013   CALCIUM  9.5 01/16/2016 1005   PROT 7.3 04/30/2023 1540   PROT 7.2 04/05/2020 1508   PROT 6.9 01/21/2017 1013   PROT 7.1 01/16/2016 1005   ALBUMIN 4.5 04/30/2023 1540   ALBUMIN 4.6 04/05/2020 1508   ALBUMIN 3.6 01/16/2016 1005   AST 21 04/30/2023 1540   AST 13 (L) 12/20/2019 1449   AST 16 01/16/2016 1005   ALT 20  04/30/2023 1540   ALT 15 12/20/2019 1449   ALT 22 01/21/2017 1013   ALT 24 01/16/2016 1005   ALKPHOS 91 04/30/2023 1540   ALKPHOS 62 01/21/2017 1013   ALKPHOS 81 01/16/2016 1005   BILITOT 0.3 04/30/2023 1540   BILITOT <0.2 04/05/2020 1508   BILITOT 0.3 12/20/2019 1449   BILITOT 0.59 01/16/2016 1005   GFRNONAA >60 04/13/2023 1530   GFRNONAA >60 12/20/2019 1449   GFRAA 87 04/05/2020 1508   GFRAA >60 12/03/2018 1346       Component Value Date/Time   WBC 9.0 11/06/2023 1351   WBC 8.0 06/04/2023 1603   RBC 5.43 (H) 11/06/2023 1351   RBC 5.41 (H) 11/06/2023 1351   HGB 15.6 (H) 11/06/2023 1351   HGB 13.7 04/05/2020 1508   HGB 12.8 01/21/2017 1013   HCT 47.5 (H) 11/06/2023 1351   HCT 41.4 04/05/2020 1508   HCT 38.2 01/21/2017 1013   PLT 281 11/06/2023 1351   PLT 289 04/05/2020 1508   MCV 87.5 11/06/2023 1351   MCV 85 04/05/2020 1508   MCV 90 01/21/2017 1013   MCH 28.7 11/06/2023 1351   MCHC 32.8 11/06/2023 1351   RDW 14.5 11/06/2023 1351   RDW 15.0 04/05/2020 1508   RDW 14.5 01/21/2017 1013   LYMPHSABS 3.2 11/06/2023 1351   LYMPHSABS 2.5 04/05/2020 1508   LYMPHSABS 3.6 (H) 01/21/2017 1013   MONOABS 0.5 11/06/2023 1351   EOSABS 0.2 11/06/2023 1351   EOSABS 0.1 04/05/2020 1508   EOSABS 0.5 01/21/2017 1013   BASOSABS 0.1 11/06/2023 1351   BASOSABS 0.1 04/05/2020 1508   BASOSABS 0.0 01/21/2017 1013    No results found for: POCLITH, LITHIUM   Lab Results  Component Value Date   PHENYTOIN <0.5 ug/mL (L) 11/05/2006   VALPROATE 73.7 11/25/2016    03/23/23 ferritin low at 7.   .res Assessment: Plan:    Beverly Cole was seen today for follow-up, depression, anxiety and stress.  Diagnoses and all orders for this visit:  Major depressive disorder, recurrent episode, moderate (HCC)  Generalized anxiety disorder  Panic disorder with agoraphobia  Claustrophobia  Uncontrolled restless legs syndrome  Benign essential tremor  Agoraphobia  Iron  deficiency anemia  due to chronic blood loss     TRD.  Lifelong history of chronic anhedonic depression and anxiety with very poor functioning . She is highly dysfunctional historically but much better with Auvelity .  She is not currently manic.  She is tolerating the medications.  She has had multiple medication failures as noted above.  Extremely complicated patient.  It is not entirely clear to me as towhether she truly has bipolar depression or TR major depression.  She has never been manic while under my care.  She is not worse with less mood stabilizer.  Disc risk mania if she is actually bipolar.  She doesn't want more mood stabilizer trials. However recent episode marked severe incr libido she attributed to Auvelity  but has been on same dose for mos.  Has become more manageable.  Denies other manic sx except staying up late on YouTube.  Watch for this.  However her depression and treatment resistant anxiety were improved with olanzapine  and sertraline  in combination. Best response ever with current med regimen including with Auvelity  addition.   Has been able to stop olanzapine  and is pleased she is can lose weight now and she is not as sedated and sleeping more normal hours.  She does not need to take the modafinil  now.  Mood much better with current combo her depression still well-controlled and anxiety is adequately controlled.  She is not having manic symptoms or other mood swings.  No problems off the olanzapine  except no appetite  RLS is unmanaged again but started iron   Caffeine can worsen RLS.  Disc timing in relation to olanzapine .  Discussed potential metabolic side effects associated with atypical antipsychotics, as well as potential risk for movement side effects. Advised pt to contact office if movement side effects occur.  For tremor is considering increasing the propranolol  continue 20 BID She'll check with cardiologist on this.  Push fluids bc so much Time in bed and past history of  dehydration. Disc risk propranolol  but it helps tremor.  Counseling 30 min on obession with another guy right now.  Has been through this before 20 years ago.  Rec againts rash decisions Continue therapy.   See counselor about her obsession with a guy.  H aware.  Encourage avoidance of self sabotage  Stop Auvelity  bc thinks it has contributed to her obsessions with another guy   Consider DM with different agent bc she thinks the Wellbutrin  in the Auvelity  is driving up her sex drive.  sertraline  75 mg daily and consider weaning propranolol  20 mg twice daily for tremor is controlled  Continue Alprazolam  0.5 mg twice daily as needed,   For RLS, rec continue iron  .  She has it being corrected for iron  deficiency.  She can't take iron  supplements per pt.  Disc this in detail.  She started iron  infusions.    FU 2 mos  Lorene Macintosh, MD, DFAPA   Please see After Visit Summary for patient specific instructions.  Future Appointments  Date Time  Provider Department Center  02/12/2024  2:30 PM [facility] None  02/12/2024  2:45 PM Franchot Lauraine HERO, NP [facility] None  02/18/2024  3:00 PM Bauert, Veva ORN, LCSW [facility] None  03/17/2024  3:00 PM Bauert, Veva ORN, LCSW [facility] None  04/05/2024  3:30 PM Cottle, Lorene KANDICE Raddle., MD [facility] None  08/30/2024  3:45 PM Alm Delon SAILOR, DO [facility] None    No orders of the defined types were placed in this encounter.     -------------------------------

## 2024-02-08 ENCOUNTER — Encounter: Payer: Self-pay | Admitting: Psychiatry

## 2024-02-12 ENCOUNTER — Inpatient Hospital Stay (HOSPITAL_BASED_OUTPATIENT_CLINIC_OR_DEPARTMENT_OTHER): Admitting: Family

## 2024-02-12 ENCOUNTER — Inpatient Hospital Stay: Attending: Hematology & Oncology

## 2024-02-12 DIAGNOSIS — D509 Iron deficiency anemia, unspecified: Secondary | ICD-10-CM

## 2024-02-12 LAB — CBC WITH DIFFERENTIAL (CANCER CENTER ONLY)
Abs Immature Granulocytes: 0.06 K/uL (ref 0.00–0.07)
Basophils Absolute: 0.1 K/uL (ref 0.0–0.1)
Basophils Relative: 1 %
Eosinophils Absolute: 0.2 K/uL (ref 0.0–0.5)
Eosinophils Relative: 3 %
HCT: 44.4 % (ref 36.0–46.0)
Hemoglobin: 14.7 g/dL (ref 12.0–15.0)
Immature Granulocytes: 1 %
Lymphocytes Relative: 34 %
Lymphs Abs: 2.2 K/uL (ref 0.7–4.0)
MCH: 29.1 pg (ref 26.0–34.0)
MCHC: 33.1 g/dL (ref 30.0–36.0)
MCV: 87.7 fL (ref 80.0–100.0)
Monocytes Absolute: 0.4 K/uL (ref 0.1–1.0)
Monocytes Relative: 6 %
Neutro Abs: 3.6 K/uL (ref 1.7–7.7)
Neutrophils Relative %: 55 %
Platelet Count: 257 K/uL (ref 150–400)
RBC: 5.06 MIL/uL (ref 3.87–5.11)
RDW: 14 % (ref 11.5–15.5)
WBC Count: 6.5 K/uL (ref 4.0–10.5)
nRBC: 0 % (ref 0.0–0.2)

## 2024-02-12 LAB — FERRITIN: Ferritin: 136 ng/mL (ref 11–307)

## 2024-02-12 LAB — IRON AND IRON BINDING CAPACITY (CC-WL,HP ONLY)
Iron: 70 ug/dL (ref 28–170)
Saturation Ratios: 18 % (ref 10.4–31.8)
TIBC: 381 ug/dL (ref 250–450)
UIBC: 311 ug/dL

## 2024-02-12 LAB — RETICULOCYTES
Immature Retic Fract: 8.6 % (ref 2.3–15.9)
RBC.: 5.12 MIL/uL — ABNORMAL HIGH (ref 3.87–5.11)
Retic Count, Absolute: 83.5 K/uL (ref 19.0–186.0)
Retic Ct Pct: 1.6 % (ref 0.4–3.1)

## 2024-02-12 NOTE — Progress Notes (Signed)
 " Hematology and Oncology Follow Up Visit  Beverly Cole [id number] [birth year] 64 y.o. 02/12/2024   Principle Diagnosis:  Iron  deficiency anemia  History of bilateral PE with positive lupus anticoagulant and mildly depressed protein S level both of which resolved    Current Therapy:        IV iron  as indicated Full dose aspirin  once daily   Interim History:  Beverly Cole is here today for follow-up. She is still feeling fatigued. She denies any blood loss.  No issue with infection. No fever, chills, n/v, cough, rash, dizziness, SOB, chest pain, palpitations, abdominal pain or changes in bowel or bladder habits.  Occasional tingling in her fingertips in the morning that resolves once she gets up and moves around.  No swelling in her extremities.  No falls or syncope reported.  Appetite and hydration are good. Weight is stable at 169 lbs.   ECOG Performance Status: 1 - Symptomatic but completely ambulatory  Medications:  Allergies as of 02/12/2024       Reactions   Cariprazine Other (See Comments)   Patient becomes suicidal when taking this medication.  Patient becomes suicidal when taking this medication.    Metoprolol  Other (See Comments)   Hallucinations hair falls out Hallucinations hair falls out   Nickel Rash   Iron  Nausea And Vomiting        Medication List        Accurate as of February 12, 2024  2:39 PM. If you have any questions, ask your nurse or doctor.          pramipexole  1 MG tablet Commonly known as: MIRAPEX  Take 1 tablet (1 mg total) by mouth every evening. The timing of this medication is very important.   ALPRAZolam  0.5 MG tablet Commonly known as: XANAX  Take 1 tablet (0.5 mg total) by mouth 2 (two) times daily as needed.   aspirin  EC 325 MG tablet Take 325 mg by mouth daily.   Azelastine  HCl 137 MCG/SPRAY Soln Place 1 spray into both nostrils 2 (two) times daily as directed   diltiazem  180 MG 24 hr capsule Commonly known as: Cartia  XT Take 1  capsule (180 mg total) by mouth daily.   fexofenadine  180 MG tablet Commonly known as: Allegra  Allergy Take 1 tablet (180 mg total) by mouth daily.   fluticasone  50 MCG/ACT nasal spray Commonly known as: FLONASE  Place 2 sprays into the nose daily as needed. For allergies   Jublia  10 % Soln Generic drug: Efinaconazole  Apply 1 Application topically daily.   mupirocin  ointment 2 % Commonly known as: BACTROBAN  Apply 1 Application topically 2 (two) times daily  until clear   NONFORMULARY OR COMPOUNDED ITEM sm Azelaic acid / metronidazole /ivermectin 15%/1%/1% cream apply to face bid   nystatin  powder Commonly known as: nystatin  Apply 1 Application topically 3 (three) times daily.   PROBIOTIC DAILY PO Take 1 tablet by mouth daily.   propranolol  20 MG tablet Commonly known as: INDERAL  Take 1 tablet (20 mg total) by mouth 2 (two) times daily.   RABEprazole  20 MG tablet Commonly known as: ACIPHEX  Take 1 tablet (20 mg total) by mouth daily.   sertraline  50 MG tablet Commonly known as: ZOLOFT  Take 1&1/2 tablets (75 mg total) by mouth daily.   tretinoin  0.025 % cream Commonly known as: RETIN-A  Apply 1 application  topically at bedtime.   trimethoprim  100 MG tablet Commonly known as: TRIMPEX  Take 1 tablet (100 mg total) by mouth as directed after intercourse.  Allergies: Allergies[1]  Past Medical History, Surgical history, Social history, and Family History were reviewed and updated.  Review of Systems: All other 10 point review of systems is negative.   Physical Exam:  vitals were not taken for this visit.   Wt Readings from Last 3 Encounters:  11/06/23 160 lb 1.9 oz (72.6 kg)  08/17/23 162 lb 9.6 oz (73.8 kg)  07/17/23 164 lb (74.4 kg)    Ocular: Sclerae unicteric, pupils equal, round and reactive to light Ear-nose-throat: Oropharynx clear, dentition fair Lymphatic: No cervical or supraclavicular adenopathy Lungs no rales or rhonchi, good excursion  bilaterally Heart regular rate and rhythm, no murmur appreciated Abd soft, nontender, positive bowel sounds MSK no focal spinal tenderness, no joint edema Neuro: non-focal, well-oriented, appropriate affect Breasts: Deferred   Lab Results  Component Value Date   WBC 6.5 02/12/2024   HGB 14.7 02/12/2024   HCT 44.4 02/12/2024   MCV 87.7 02/12/2024   PLT 257 02/12/2024   Lab Results  Component Value Date   FERRITIN 200 11/06/2023   IRON  84 11/06/2023   TIBC 441 11/06/2023   UIBC 357 11/06/2023   IRONPCTSAT 19 11/06/2023   Lab Results  Component Value Date   RETICCTPCT 1.6 02/12/2024   RBC 5.12 (H) 02/12/2024   RETICCTABS 82,240 (H) 06/04/2023   No results found for: KPAFRELGTCHN, LAMBDASER, KAPLAMBRATIO No results found for: KIMBERLY LE, IGMSERUM No results found for: STEPHANY CARLOTA BENSON MARKEL EARLA JOANNIE DOC VICK, SPEI   Chemistry      Component Value Date/Time   NA 141 04/30/2023 1540   NA 145 (H) 04/05/2020 1508   NA 142 01/21/2017 1013   NA 143 01/16/2016 1005   K 4.3 04/30/2023 1540   K 3.2 (L) 01/21/2017 1013   K 3.3 (L) 01/16/2016 1005   CL 104 04/30/2023 1540   CL 106 01/21/2017 1013   CO2 26 04/30/2023 1540   CO2 29 01/21/2017 1013   CO2 20 (L) 01/16/2016 1005   BUN 15 04/30/2023 1540   BUN 14 04/05/2020 1508   BUN 9 01/21/2017 1013   BUN 12.6 01/16/2016 1005   CREATININE 0.87 04/30/2023 1540   CREATININE 0.94 12/20/2019 1449   CREATININE 0.9 01/21/2017 1013   CREATININE 0.9 01/16/2016 1005      Component Value Date/Time   CALCIUM  9.9 04/30/2023 1540   CALCIUM  9.2 01/21/2017 1013   CALCIUM  9.5 01/16/2016 1005   ALKPHOS 91 04/30/2023 1540   ALKPHOS 62 01/21/2017 1013   ALKPHOS 81 01/16/2016 1005   AST 21 04/30/2023 1540   AST 13 (L) 12/20/2019 1449   AST 16 01/16/2016 1005   ALT 20 04/30/2023 1540   ALT 15 12/20/2019 1449   ALT 22 01/21/2017 1013   ALT 24 01/16/2016 1005   BILITOT 0.3  04/30/2023 1540   BILITOT <0.2 04/05/2020 1508   BILITOT 0.3 12/20/2019 1449   BILITOT 0.59 01/16/2016 1005       Impression and Plan: Beverly Cole is a pleasant 64 yo caucasian female with IDA not responsive to oral iron .  Iron  studies are pending. We will replace if needed.  Follow-up in 6 months.     Lauraine Pepper, NP 1/2/20262:39 PM     [1]  Allergies Allergen Reactions   Cariprazine Other (See Comments)    Patient becomes suicidal when taking this medication.  Patient becomes suicidal when taking this medication.    Metoprolol  Other (See Comments)    Hallucinations hair falls out Hallucinations hair falls  out   Nickel Rash   Iron  Nausea And Vomiting   "

## 2024-02-17 ENCOUNTER — Encounter: Payer: Self-pay | Admitting: Family

## 2024-02-18 ENCOUNTER — Ambulatory Visit: Admitting: Psychology

## 2024-02-19 ENCOUNTER — Encounter (HOSPITAL_COMMUNITY): Payer: Self-pay

## 2024-02-19 ENCOUNTER — Other Ambulatory Visit: Payer: Self-pay | Admitting: Psychiatry

## 2024-02-19 ENCOUNTER — Encounter: Payer: Self-pay | Admitting: Family

## 2024-02-19 ENCOUNTER — Other Ambulatory Visit: Payer: Self-pay | Admitting: Family Medicine

## 2024-02-19 ENCOUNTER — Other Ambulatory Visit (HOSPITAL_COMMUNITY): Payer: Self-pay

## 2024-02-19 DIAGNOSIS — N952 Postmenopausal atrophic vaginitis: Secondary | ICD-10-CM

## 2024-02-19 DIAGNOSIS — F4000 Agoraphobia, unspecified: Secondary | ICD-10-CM

## 2024-02-19 DIAGNOSIS — F4001 Agoraphobia with panic disorder: Secondary | ICD-10-CM

## 2024-02-19 DIAGNOSIS — G2581 Restless legs syndrome: Secondary | ICD-10-CM

## 2024-02-19 MED ORDER — TRIMETHOPRIM 100 MG PO TABS
100.0000 mg | ORAL_TABLET | ORAL | 2 refills | Status: AC
  Filled 2024-02-19: qty 90, 90d supply, fill #0

## 2024-02-19 MED ORDER — ALPRAZOLAM 0.5 MG PO TABS
0.5000 mg | ORAL_TABLET | Freq: Two times a day (BID) | ORAL | 0 refills | Status: AC | PRN
  Filled 2024-02-19: qty 30, 15d supply, fill #0

## 2024-02-19 MED ORDER — PRAMIPEXOLE DIHYDROCHLORIDE 1 MG PO TABS
1.0000 mg | ORAL_TABLET | Freq: Every evening | ORAL | 1 refills | Status: AC
  Filled 2024-02-19: qty 90, 90d supply, fill #0

## 2024-02-22 ENCOUNTER — Other Ambulatory Visit: Payer: Self-pay | Admitting: Family Medicine

## 2024-02-22 DIAGNOSIS — Z1231 Encounter for screening mammogram for malignant neoplasm of breast: Secondary | ICD-10-CM

## 2024-02-23 ENCOUNTER — Other Ambulatory Visit (HOSPITAL_COMMUNITY): Payer: Self-pay

## 2024-03-01 ENCOUNTER — Ambulatory Visit: Admission: RE | Admit: 2024-03-01 | Discharge: 2024-03-01 | Disposition: A | Source: Ambulatory Visit

## 2024-03-01 ENCOUNTER — Other Ambulatory Visit (HOSPITAL_COMMUNITY): Payer: Self-pay

## 2024-03-01 DIAGNOSIS — Z1231 Encounter for screening mammogram for malignant neoplasm of breast: Secondary | ICD-10-CM

## 2024-03-17 ENCOUNTER — Telehealth: Payer: Self-pay | Admitting: Psychiatry

## 2024-03-17 ENCOUNTER — Ambulatory Visit: Admitting: Psychology

## 2024-03-17 DIAGNOSIS — F331 Major depressive disorder, recurrent, moderate: Secondary | ICD-10-CM

## 2024-03-17 NOTE — Telephone Encounter (Signed)
 Put her on the cancellation list for next week.

## 2024-03-17 NOTE — Telephone Encounter (Signed)
 Pt LVM @ 2:35p.  She wants an emergency appt with Dr Geoffry due to the narcissist.  Dr Geoffry said to route this to you to see what's going on with her and let him know.

## 2024-03-17 NOTE — Telephone Encounter (Signed)
 Pt reports she is obsessed with the narcissist. She feels like she is going crazy. Rates anxiety as a 12 and depression 8/10. Not sleeping well, 2-4 hours night. Not eating because she doesn't feel like it and has lost weight. Reports taking all medications as prescribed.

## 2024-03-17 NOTE — Progress Notes (Signed)
 "  [facility] Counselor/Therapist Progress Note  Patient ID: Beverly Cole, MRN: [id number],    Date: 03/17/2024  Time Spent: 3:00pm-3:55pm  55 minutes   Treatment Type: Individual Therapy  Reported Symptoms: feeling out of control  Mental Status Exam: Appearance:  Casual     Behavior: Appropriate  Motor: Normal  Speech/Language:  Normal Rate  Affect: Appropriate  Mood: normal  Thought process: normal  Thought content:   WNL  Sensory/Perceptual disturbances:   WNL  Orientation: oriented to person, place, time/date, and situation  Attention: Good  Concentration: Good  Memory: WNL  Fund of knowledge:  Good  Insight:   Good  Judgment:  Fair  Impulse Control: Poor   Risk Assessment: Danger to Self:  No Self-injurious Behavior: No Danger to Others: No Duty to Warn:no Physical Aggression / Violence:No  Access to Firearms a concern: No  Gang Involvement:No   Subjective: Pt present for face-to-face individual therapy via video.  Pt consents to telehealth video session and is aware of limitations and benefits of virtual sessions. Location of pt: home Location of therapist: home office.   Pt states she is feeling out of control.  She continues to be obsessed with the guy online that she describes as a narcissist.   Pt has contacted her psychiatrist's office to try to get an emergency appointment with Dr. Geoffry.  Pt states she is not suicidal but she is having trouble controlling her thoughts and impulses.   Pt has looked up flights to [place] to go and see the guy online.   She states he only talks to her through his music.   Pt is seeing multiple therapists.   She is taking a trauma class online.  She is seeing a local therapist Melissa weekly who specializes in narcissism.   That therapist recently told pt she thinks pt has imagined the online guy's interest in her and is making things up.   Pt is upset about this.   She states she wants to marry this guy.  Pt is  concerned about how obsessed she is and how willing she is to lose her current life over a guy she has never met.   Addressed the need for a higher level of treatment.   Recommended pt consider an intensive outpatient program.   Melissa recommended pt consider going to the IOP at [facility].   Pt is willing to consider this but wants to talk to Dr. Geoffry first.   Pt was able to contract that she will not hurt herself.  She states she is not suicidal.   She is confused about what is happening to her.    Interventions: Cognitive Behavioral Therapy and Insight-Oriented  Diagnosis: F33.1  Plan of Care: Recommend ongoing therapy.   Pt participated in setting treatment goals.  Plan to meet monthly.  Pt agrees with treatment plan.  Treatment Plan Client Abilities/Strengths  Pt is bright, engaging, and motivated for therapy.   Client Treatment Preferences  Individual therapy.  Client Statement of Needs  Improve coping skills.  Symptoms  Depressed or irritable mood. Diminished interest in or enjoyment of activities. Lack of energy. Poor concentration and indecisiveness. Social withdrawal.  Problems Addressed  Unipolar Depression Goals 1. Alleviate depressive symptoms and return to previous level of effective functioning. 2. Appropriately grieve the loss in order to normalize mood and to return to previously adaptive level of functioning. Objective Learn and implement behavioral strategies to overcome depression. Target Date: 2024-12-16 Frequency: monthly  Progress: 55 Modality: individual  Related Interventions Engage the client in behavioral activation, increasing his/her activity level and contact with sources of reward, while identifying processes that inhibit activation.  Use behavioral techniques such as instruction, rehearsal, role-playing, role reversal, as needed, to facilitate activity in the client's daily life; reinforce success. Assist the client in developing skills  that increase the likelihood of deriving pleasure from behavioral activation (e.g., assertiveness skills, developing an exercise plan, less internal/more external focus, increased social involvement); reinforce success. Objective Identify important people in life, past and present, and describe the quality, good and poor, of those relationships. Target Date: 2024-12-16 Frequency: monthly  Progress: 55 Modality: individual  Related Interventions Conduct Interpersonal Therapy beginning with the assessment of the client's interpersonal inventory of important past and present relationships; develop a case formulation linking depression to grief, interpersonal role disputes, role transitions, and/or interpersonal deficits). Objective Learn and implement problem-solving and decision-making skills. Target Date: 2024-12-16 Frequency: monthly  Progress: 55 Modality: individual  Related Interventions Conduct Problem-Solving Therapy using techniques such as psychoeducation, modeling, and role-playing to teach client problem-solving skills (i.e., defining a problem specifically, generating possible solutions, evaluating the pros and cons of each solution, selecting and implementing a plan of action, evaluating the efficacy of the plan, accepting or revising the plan); role-play application of the problem-solving skill to a real life issue. Encourage in the client the development of a positive problem orientation in which problems and solving them are viewed as a natural part of life and not something to be feared, despaired, or avoided. 3. Develop healthy interpersonal relationships that lead to the alleviation and help prevent the relapse of depression. 4. Develop healthy thinking patterns and beliefs about self, others, and the world that lead to the alleviation and help prevent the relapse of depression. 5. Recognize, accept, and cope with feelings of depression. Diagnosis F33.1  Conditions For  Discharge Achievement of treatment goals and objectives   [name], LCSW    "

## 2024-04-05 ENCOUNTER — Telehealth: Admitting: Psychiatry

## 2024-04-14 ENCOUNTER — Ambulatory Visit: Admitting: Psychology

## 2024-06-10 ENCOUNTER — Ambulatory Visit: Admitting: Cardiovascular Disease

## 2024-08-19 ENCOUNTER — Inpatient Hospital Stay

## 2024-08-19 ENCOUNTER — Inpatient Hospital Stay: Admitting: Family

## 2024-08-30 ENCOUNTER — Ambulatory Visit: Admitting: Dermatology
